# Patient Record
Sex: Female | Born: 1940
Health system: Southern US, Community
[De-identification: ages and names within clinical notes are randomized; demographics above are authoritative.]

## PROBLEM LIST (undated history)

## (undated) DIAGNOSIS — F329 Major depressive disorder, single episode, unspecified: Secondary | ICD-10-CM

## (undated) DIAGNOSIS — K219 Gastro-esophageal reflux disease without esophagitis: Secondary | ICD-10-CM

## (undated) DIAGNOSIS — K269 Duodenal ulcer, unspecified as acute or chronic, without hemorrhage or perforation: Secondary | ICD-10-CM

## (undated) DIAGNOSIS — E1140 Type 2 diabetes mellitus with diabetic neuropathy, unspecified: Secondary | ICD-10-CM

## (undated) DIAGNOSIS — D649 Anemia, unspecified: Secondary | ICD-10-CM

## (undated) DIAGNOSIS — M1990 Unspecified osteoarthritis, unspecified site: Secondary | ICD-10-CM

## (undated) DIAGNOSIS — M109 Gout, unspecified: Secondary | ICD-10-CM

## (undated) DIAGNOSIS — K2970 Gastritis, unspecified, without bleeding: Secondary | ICD-10-CM

## (undated) DIAGNOSIS — Z903 Acquired absence of stomach [part of]: Secondary | ICD-10-CM

## (undated) DIAGNOSIS — R011 Cardiac murmur, unspecified: Secondary | ICD-10-CM

## (undated) DIAGNOSIS — E876 Hypokalemia: Secondary | ICD-10-CM

## (undated) DIAGNOSIS — K3184 Gastroparesis: Secondary | ICD-10-CM

## (undated) DIAGNOSIS — N390 Urinary tract infection, site not specified: Secondary | ICD-10-CM

## (undated) DIAGNOSIS — J302 Other seasonal allergic rhinitis: Secondary | ICD-10-CM

## (undated) DIAGNOSIS — K2960 Other gastritis without bleeding: Secondary | ICD-10-CM

## (undated) DIAGNOSIS — E785 Hyperlipidemia, unspecified: Secondary | ICD-10-CM

## (undated) DIAGNOSIS — I10 Essential (primary) hypertension: Secondary | ICD-10-CM

## (undated) DIAGNOSIS — F32A Depression, unspecified: Secondary | ICD-10-CM

## (undated) DIAGNOSIS — Z5189 Encounter for other specified aftercare: Secondary | ICD-10-CM

## (undated) DIAGNOSIS — K838 Other specified diseases of biliary tract: Secondary | ICD-10-CM

## (undated) DIAGNOSIS — K648 Other hemorrhoids: Secondary | ICD-10-CM

## (undated) DIAGNOSIS — Z794 Long term (current) use of insulin: Secondary | ICD-10-CM

## (undated) DIAGNOSIS — K259 Gastric ulcer, unspecified as acute or chronic, without hemorrhage or perforation: Secondary | ICD-10-CM

## (undated) DIAGNOSIS — K449 Diaphragmatic hernia without obstruction or gangrene: Secondary | ICD-10-CM

## (undated) DIAGNOSIS — G629 Polyneuropathy, unspecified: Secondary | ICD-10-CM

## (undated) DIAGNOSIS — G4730 Sleep apnea, unspecified: Secondary | ICD-10-CM

## (undated) DIAGNOSIS — K8050 Calculus of bile duct without cholangitis or cholecystitis without obstruction: Secondary | ICD-10-CM

## (undated) DIAGNOSIS — Z950 Presence of cardiac pacemaker: Secondary | ICD-10-CM

## (undated) DIAGNOSIS — E118 Type 2 diabetes mellitus with unspecified complications: Secondary | ICD-10-CM

## (undated) DIAGNOSIS — T7840XA Allergy, unspecified, initial encounter: Secondary | ICD-10-CM

## (undated) DIAGNOSIS — M810 Age-related osteoporosis without current pathological fracture: Secondary | ICD-10-CM

## (undated) DIAGNOSIS — R7989 Other specified abnormal findings of blood chemistry: Secondary | ICD-10-CM

## (undated) HISTORY — DX: Long term (current) use of insulin: Z79.4

## (undated) HISTORY — DX: Diaphragmatic hernia without obstruction or gangrene: K44.9

## (undated) HISTORY — DX: Type 2 diabetes mellitus with diabetic neuropathy, unspecified: E11.40

## (undated) HISTORY — DX: Gastro-esophageal reflux disease without esophagitis: K21.9

## (undated) HISTORY — PX: TONSILLECTOMY: SUR1361

## (undated) HISTORY — DX: Depression, unspecified: F32.A

## (undated) HISTORY — DX: Gastritis, unspecified, without bleeding: K29.70

## (undated) HISTORY — DX: Gout, unspecified: M10.9

## (undated) HISTORY — DX: Presence of cardiac pacemaker: Z95.0

## (undated) HISTORY — DX: Sleep apnea, unspecified: G47.30

## (undated) HISTORY — PX: PARTIAL GASTRECTOMY: SHX2172

## (undated) HISTORY — DX: Calculus of bile duct without cholangitis or cholecystitis without obstruction: K80.50

## (undated) HISTORY — DX: Encounter for other specified aftercare: Z51.89

## (undated) HISTORY — DX: Other seasonal allergic rhinitis: J30.2

## (undated) HISTORY — PX: OTHER SURGICAL HISTORY: SHX169

## (undated) HISTORY — DX: Hyperlipidemia, unspecified: E78.5

## (undated) HISTORY — PX: EYE SURGERY: SHX253

## (undated) HISTORY — DX: Other hemorrhoids: K64.8

## (undated) HISTORY — DX: Cardiac murmur, unspecified: R01.1

## (undated) HISTORY — DX: Urinary tract infection, site not specified: N39.0

## (undated) HISTORY — DX: Other specified diseases of biliary tract: K83.8

## (undated) HISTORY — DX: Gastroparesis: K31.84

## (undated) HISTORY — DX: Essential (primary) hypertension: I10

## (undated) HISTORY — PX: JOINT REPLACEMENT: SHX530

## (undated) HISTORY — DX: Age-related osteoporosis without current pathological fracture: M81.0

## (undated) HISTORY — DX: Anemia, unspecified: D64.9

## (undated) HISTORY — DX: Unspecified osteoarthritis, unspecified site: M19.90

## (undated) HISTORY — DX: Acquired absence of stomach (part of): Z90.3

## (undated) HISTORY — DX: Allergy, unspecified, initial encounter: T78.40XA

## (undated) HISTORY — DX: Polyneuropathy, unspecified: G62.9

## (undated) HISTORY — DX: Other gastritis without bleeding: K29.60

## (undated) HISTORY — DX: Major depressive disorder, single episode, unspecified: F32.9

## (undated) HISTORY — DX: Duodenal ulcer, unspecified as acute or chronic, without hemorrhage or perforation: K26.9

## (undated) HISTORY — DX: Hypokalemia: E87.6

## (undated) HISTORY — DX: Other specified abnormal findings of blood chemistry: R79.89

## (undated) HISTORY — PX: GASTRECTOMY: SHX58

## (undated) HISTORY — DX: Type 2 diabetes mellitus with unspecified complications: E11.8

## (undated) HISTORY — PX: UPPER GASTROINTESTINAL ENDOSCOPY: SHX188

## (undated) HISTORY — DX: Gastric ulcer, unspecified as acute or chronic, without hemorrhage or perforation: K25.9

---

## 2015-12-20 ENCOUNTER — Ambulatory Visit (INDEPENDENT_AMBULATORY_CARE_PROVIDER_SITE_OTHER): Payer: Medicare Other | Admitting: Emergency Medicine

## 2015-12-20 VITALS — BP 120/68 | HR 84 | Temp 98.4°F | Resp 18 | Ht 64.0 in | Wt 136.0 lb

## 2015-12-20 DIAGNOSIS — N3000 Acute cystitis without hematuria: Secondary | ICD-10-CM

## 2015-12-20 LAB — POCT URINALYSIS DIP (MANUAL ENTRY)
BILIRUBIN UA: NEGATIVE
Glucose, UA: NEGATIVE
NITRITE UA: NEGATIVE
PH UA: 5.5
PROTEIN UA: NEGATIVE
RBC UA: NEGATIVE
Spec Grav, UA: 1.02
Urobilinogen, UA: 0.2

## 2015-12-20 LAB — POC MICROSCOPIC URINALYSIS (UMFC): MUCUS RE: ABSENT

## 2015-12-20 MED ORDER — CIPROFLOXACIN HCL 500 MG PO TABS: 500.0000 mg | ORAL_TABLET | Freq: Two times a day (BID) | ORAL | Status: DC

## 2015-12-20 MED ORDER — PHENAZOPYRIDINE HCL 200 MG PO TABS: 200.0000 mg | ORAL_TABLET | Freq: Three times a day (TID) | ORAL | Status: DC | PRN

## 2015-12-20 NOTE — Progress Notes (Signed)
Subjective:  Patient ID: Judith [name], female    DOB: 1941-11-30  Age: 74 y.o. MRN: [id number]  CC: Dysuria and Urinary Frequency   HPI Judith [name] presents   Patient is visiting from [place] and has over the last several days developed dysuria urgency and frequency. She has no fever or chills. No back pain. No nausea or vomiting. No abdominal pain. No stool change. No vaginal discharge or bleeding.  History Judith [name] has a past medical history of Diabetes mellitus without complication ([place]); Hypertension; Myocardial infarction [facility]); GERD (gastroesophageal reflux disease); and Allergy.   She has past surgical history that includes pace maker; Gastric bypass; Coronary artery bypass graft; and Joint replacement.   Her  family history is not on file.  She   reports that she has never smoked. She does not have any smokeless tobacco history on file. She reports that she does not drink alcohol or use illicit drugs.  No outpatient prescriptions prior to visit.   No facility-administered medications prior to visit.    Social History   Social History  . Marital Status: Widowed    Spouse Name: N/A  . Number of Children: N/A  . Years of Education: N/A   Social History Main Topics  . Smoking status: Never Smoker   . Smokeless tobacco: None  . Alcohol Use: No  . Drug Use: No  . Sexual Activity: Not Asked   Other Topics Concern  . None   Social History Narrative  . None     Review of Systems  Constitutional: Negative for fever, chills and appetite change.  HENT: Negative for congestion, ear pain, postnasal drip, sinus pressure and sore throat.   Eyes: Negative for pain and redness.  Respiratory: Negative for cough, shortness of breath and wheezing.   Cardiovascular: Negative for leg swelling.  Gastrointestinal: Negative for nausea, vomiting, abdominal pain, diarrhea, constipation and blood in stool.  Endocrine: Negative for polyuria.  Genitourinary: Positive for dysuria,  urgency and frequency. Negative for flank pain.  Musculoskeletal: Negative for gait problem.  Skin: Negative for rash.  Neurological: Negative for weakness and headaches.  Psychiatric/Behavioral: Negative for confusion and decreased concentration. The patient is not nervous/anxious.     Objective:  BP 120/68 mmHg  Pulse 84  Temp(Src) 98.4 F (36.9 C) (Oral)  Resp 18  Ht 5\' 4"  (1.626 m)  Wt 136 lb (61.689 kg)  BMI 23.33 kg/m2  SpO2 95%  Physical Exam  Constitutional: She is oriented to person, place, and time. She appears well-developed and well-nourished. No distress.  HENT:  Head: Normocephalic and atraumatic.  Right Ear: External ear normal.  Left Ear: External ear normal.  Nose: Nose normal.  Eyes: Conjunctivae and EOM are normal. Pupils are equal, round, and reactive to light. No scleral icterus.  Neck: Normal range of motion. Neck supple. No tracheal deviation present.  Cardiovascular: Normal rate, regular rhythm and normal heart sounds.   Pulmonary/Chest: Effort normal. No respiratory distress. She has no wheezes. She has no rales.  Abdominal: She exhibits no mass. There is no tenderness. There is no rebound and no guarding.  Musculoskeletal: She exhibits no edema.  Lymphadenopathy:    She has no cervical adenopathy.  Neurological: She is alert and oriented to person, place, and time. Coordination normal.  Skin: Skin is warm and dry. No rash noted.  Psychiatric: She has a normal mood and affect. Her behavior is normal.      Assessment & Plan:   Judith [name] was seen today for  dysuria and urinary frequency.  Diagnoses and all orders for this visit:  Acute cystitis without hematuria -     POCT Microscopic Urinalysis ([facility]) -     POCT urinalysis dipstick  Other orders -     phenazopyridine (PYRIDIUM) 200 MG tablet; Take 1 tablet (200 mg total) by mouth 3 (three) times daily as needed. -     ciprofloxacin (CIPRO) 500 MG tablet; Take 1 tablet (500 mg total) by mouth 2  (two) times daily.   I am having Ms. Parisien start on phenazopyridine and ciprofloxacin. I am also having her maintain her insulin glargine, carvedilol, omeprazole, lisinopril, buPROPion, lovastatin, amLODipine, PARoxetine, sucralfate, HYDROcodone-acetaminophen, insulin lispro, cyclobenzaprine, promethazine, celecoxib, Biotin, aspirin, and acetaminophen-codeine.  Meds ordered this encounter  Medications  . insulin glargine (LANTUS) 100 UNIT/ML injection    Sig: Inject 16 Units into the skin 2 (two) times daily.  . carvedilol (COREG) 25 MG tablet    Sig: Take 25 mg by mouth 2 (two) times daily with a meal.  . omeprazole (PRILOSEC) 40 MG capsule    Sig: Take 40 mg by mouth daily.  Marland Kitchen lisinopril (PRINIVIL,ZESTRIL) 10 MG tablet    Sig: Take 10 mg by mouth 2 (two) times daily.  Marland Kitchen buPROPion (ZYBAN) 150 MG 12 hr tablet    Sig: Take 150 mg by mouth 2 (two) times daily.  Marland Kitchen lovastatin (MEVACOR) 40 MG tablet    Sig: Take 40 mg by mouth at bedtime.  Marland Kitchen amLODipine (NORVASC) 10 MG tablet    Sig: Take 10 mg by mouth daily.  Marland Kitchen PARoxetine (PAXIL) 40 MG tablet    Sig: Take 40 mg by mouth every morning.  . sucralfate (CARAFATE) 1 G tablet    Sig: Take 1 g by mouth 4 (four) times daily.  Marland Kitchen HYDROcodone-acetaminophen (NORCO/VICODIN) 5-325 MG tablet    Sig: Take 1 tablet by mouth every 4 (four) hours as needed for moderate pain.  Marland Kitchen insulin lispro (HUMALOG) 100 UNIT/ML injection    Sig: Inject into the skin 3 (three) times daily before meals.  . cyclobenzaprine (FLEXERIL) 5 MG tablet    Sig: Take 5 mg by mouth 3 (three) times daily.  . promethazine (PHENERGAN) 25 MG tablet    Sig: Take 25 mg by mouth every 6 (six) hours as needed for nausea or vomiting.  . celecoxib (CELEBREX) 200 MG capsule    Sig: Take 200 mg by mouth daily.  . Biotin 10 MG CAPS    Sig: Take by mouth.  Marland Kitchen aspirin 81 MG tablet    Sig: Take 81 mg by mouth daily.  Marland Kitchen acetaminophen-codeine (TYLENOL #3) 300-30 MG tablet    Sig: Take by mouth  every 6 (six) hours as needed for moderate pain.  . phenazopyridine (PYRIDIUM) 200 MG tablet    Sig: Take 1 tablet (200 mg total) by mouth 3 (three) times daily as needed.    Dispense:  6 tablet    Refill:  0  . ciprofloxacin (CIPRO) 500 MG tablet    Sig: Take 1 tablet (500 mg total) by mouth 2 (two) times daily.    Dispense:  20 tablet    Refill:  0    Appropriate red flag conditions were discussed with the patient as well as actions that should be taken.  Patient expressed his understanding.  Follow-up: Return if symptoms worsen or fail to improve.  Roselee Culver, MD   Results for orders placed or performed in visit on 12/20/15  POCT Microscopic  Urinalysis ([facility])  Result Value Ref Range   WBC,UR,HPF,POC Many (A) None WBC/hpf   RBC,UR,HPF,POC None None RBC/hpf   Bacteria Few (A) None, Too numerous to count   Mucus Absent Absent   Epithelial Cells, UR Per Microscopy Few (A) None, Too numerous to count cells/hpf  POCT urinalysis dipstick  Result Value Ref Range   Color, UA yellow yellow   Clarity, UA clear clear   Glucose, UA negative negative   Bilirubin, UA negative negative   Ketones, POC UA trace (5) (A) negative   Spec Grav, UA 1.020    Blood, UA negative negative   pH, UA 5.5    Protein Ur, POC negative negative   Urobilinogen, UA 0.2    Nitrite, UA Negative Negative   Leukocytes, UA small (1+) (A) Negative

## 2015-12-20 NOTE — Patient Instructions (Signed)

## 2016-01-06 DIAGNOSIS — E782 Mixed hyperlipidemia: Secondary | ICD-10-CM | POA: Diagnosis not present

## 2016-01-06 DIAGNOSIS — Z950 Presence of cardiac pacemaker: Secondary | ICD-10-CM | POA: Diagnosis not present

## 2016-01-06 DIAGNOSIS — I10 Essential (primary) hypertension: Secondary | ICD-10-CM | POA: Diagnosis not present

## 2016-01-06 DIAGNOSIS — E119 Type 2 diabetes mellitus without complications: Secondary | ICD-10-CM | POA: Diagnosis not present

## 2016-03-06 DIAGNOSIS — G894 Chronic pain syndrome: Secondary | ICD-10-CM | POA: Diagnosis not present

## 2016-03-14 DIAGNOSIS — J029 Acute pharyngitis, unspecified: Secondary | ICD-10-CM | POA: Diagnosis not present

## 2016-04-05 ENCOUNTER — Ambulatory Visit (INDEPENDENT_AMBULATORY_CARE_PROVIDER_SITE_OTHER): Payer: Medicare Other | Admitting: Family Medicine

## 2016-04-05 ENCOUNTER — Encounter (HOSPITAL_COMMUNITY): Payer: Self-pay

## 2016-04-05 ENCOUNTER — Ambulatory Visit (HOSPITAL_COMMUNITY)
Admission: RE | Admit: 2016-04-05 | Discharge: 2016-04-05 | Disposition: A | Discharge status: Home / Self Care | Payer: Medicare Other | Source: Ambulatory Visit | Attending: Family Medicine | Admitting: Family Medicine

## 2016-04-05 VITALS — BP 118/70 | HR 85 | Temp 98.2°F | Resp 17 | Ht 63.5 in | Wt 137.0 lb

## 2016-04-05 DIAGNOSIS — M47812 Spondylosis without myelopathy or radiculopathy, cervical region: Secondary | ICD-10-CM

## 2016-04-05 DIAGNOSIS — Z794 Long term (current) use of insulin: Secondary | ICD-10-CM

## 2016-04-05 DIAGNOSIS — T148 Other injury of unspecified body region: Secondary | ICD-10-CM | POA: Diagnosis not present

## 2016-04-05 DIAGNOSIS — M4692 Unspecified inflammatory spondylopathy, cervical region: Secondary | ICD-10-CM

## 2016-04-05 DIAGNOSIS — R51 Headache: Secondary | ICD-10-CM | POA: Diagnosis not present

## 2016-04-05 DIAGNOSIS — IMO0001 Reserved for inherently not codable concepts without codable children: Secondary | ICD-10-CM

## 2016-04-05 DIAGNOSIS — G319 Degenerative disease of nervous system, unspecified: Secondary | ICD-10-CM | POA: Diagnosis not present

## 2016-04-05 DIAGNOSIS — S20211A Contusion of right front wall of thorax, initial encounter: Secondary | ICD-10-CM | POA: Diagnosis not present

## 2016-04-05 DIAGNOSIS — W19XXXA Unspecified fall, initial encounter: Secondary | ICD-10-CM

## 2016-04-05 DIAGNOSIS — R42 Dizziness and giddiness: Secondary | ICD-10-CM | POA: Diagnosis not present

## 2016-04-05 DIAGNOSIS — E1165 Type 2 diabetes mellitus with hyperglycemia: Secondary | ICD-10-CM | POA: Diagnosis not present

## 2016-04-05 DIAGNOSIS — E119 Type 2 diabetes mellitus without complications: Secondary | ICD-10-CM | POA: Insufficient documentation

## 2016-04-05 HISTORY — DX: Spondylosis without myelopathy or radiculopathy, cervical region: M47.812

## 2016-04-05 HISTORY — DX: Long term (current) use of insulin: Z79.4

## 2016-04-05 NOTE — Progress Notes (Addendum)
This is a 75 year old retired woman from [place] who came up for Easter to visit her daughter. She fell on Sunday night after driving up when she tripped on her luggage. She's had some headache, neck pain, right chest bruise, and left proximal thigh bruise.  She has chronic neck pain and does not think her neck is any worse. She has no localized tenderness of her neck.  Patient did not lose consciousness and has no bruising on her head. She does feel that she has some swelling and parietal areas of her scalp. She is able to walk and do all of her ADLs without problem. She's had no change in her vision or hearing.  Objective: Pleasant elderly woman in no acute distress BP 118/70 mmHg  Pulse 85  Temp(Src) 98.2 F (36.8 C) (Oral)  Resp 17  Ht 5' 3.5" (1.613 m)  Wt 137 lb (62.143 kg)  BMI 23.88 kg/m2  SpO2 94% Neurologically: Cranial nerves are intact with the exception of some slight ptosis of the right upper eyelid which patient says she had before the accident. Pupils are equal and reactive. There is no battle sign. TMs are normal. Oropharynx is clear and tongue is midline with symmetric elevation of the soft palate. Patient is moving all 4 extremities equally Patient has a large bruise below the right areola of her breast with no skin break Patient has no chest tenderness or spinal tenderness. Lungs are clear Chest shows the large ecchymotic area surrounding the right areola but is not tender with rib compression or sternal compression. Abdomen: Soft nontender without HSM or any ecchymotic areas. Extremities are moving without difficulty and patient is able to sit and lie down without assistance.  Assessment: I suspect patient just had some contusions but with the head symptoms, his subdurals possibility.  Plan: CT this morning, reassurance if this is okay.  Signed, Robyn Haber M.D.  EXAM: CT HEAD WITHOUT CONTRAST  TECHNIQUE: Contiguous axial images were obtained  from the base of the skull through the vertex without intravenous contrast.  COMPARISON: None.  FINDINGS: The bony calvarium is intact. No gross soft tissue abnormality is noted. Mild atrophic changes are noted consistent with the patient's given age. No findings to suggest acute hemorrhage, acute infarction or space-occupying mass lesion are noted.  IMPRESSION: Mild atrophic changes without acute abnormality.   Electronically Signed  By: Inez Catalina M.D.  On: 04/05/2016 13:31

## 2016-04-05 NOTE — Patient Instructions (Addendum)
Go to [facility] today at 12:45 pm for outpatient ct. You will register first floor radiology.  I suspect you just have some bruising around the scalp, chest, and left leg. We're doing a CAT scan to make sure you do not have a subdural hematoma. If the CAT scan is negative, then you may use Tylenol or other over-the-counter simple medicines for comfort until these injuries heal on their own.

## 2016-06-04 DIAGNOSIS — Z6823 Body mass index (BMI) 23.0-23.9, adult: Secondary | ICD-10-CM | POA: Diagnosis not present

## 2016-06-04 DIAGNOSIS — G894 Chronic pain syndrome: Secondary | ICD-10-CM | POA: Diagnosis not present

## 2016-08-17 DIAGNOSIS — Z6822 Body mass index (BMI) 22.0-22.9, adult: Secondary | ICD-10-CM | POA: Diagnosis not present

## 2016-08-17 DIAGNOSIS — E119 Type 2 diabetes mellitus without complications: Secondary | ICD-10-CM | POA: Diagnosis not present

## 2016-08-17 DIAGNOSIS — G894 Chronic pain syndrome: Secondary | ICD-10-CM | POA: Diagnosis not present

## 2016-08-23 DIAGNOSIS — J029 Acute pharyngitis, unspecified: Secondary | ICD-10-CM | POA: Diagnosis not present

## 2016-08-23 DIAGNOSIS — Z6822 Body mass index (BMI) 22.0-22.9, adult: Secondary | ICD-10-CM | POA: Diagnosis not present

## 2016-09-13 DIAGNOSIS — J029 Acute pharyngitis, unspecified: Secondary | ICD-10-CM | POA: Diagnosis not present

## 2016-09-13 DIAGNOSIS — G894 Chronic pain syndrome: Secondary | ICD-10-CM | POA: Diagnosis not present

## 2016-10-19 DIAGNOSIS — Z23 Encounter for immunization: Secondary | ICD-10-CM | POA: Diagnosis not present

## 2016-11-30 DIAGNOSIS — J028 Acute pharyngitis due to other specified organisms: Secondary | ICD-10-CM | POA: Diagnosis not present

## 2016-11-30 DIAGNOSIS — Z6823 Body mass index (BMI) 23.0-23.9, adult: Secondary | ICD-10-CM | POA: Diagnosis not present

## 2016-12-28 DIAGNOSIS — J988 Other specified respiratory disorders: Secondary | ICD-10-CM | POA: Diagnosis not present

## 2016-12-28 DIAGNOSIS — Z6822 Body mass index (BMI) 22.0-22.9, adult: Secondary | ICD-10-CM | POA: Diagnosis not present

## 2017-01-11 DIAGNOSIS — E7800 Pure hypercholesterolemia, unspecified: Secondary | ICD-10-CM | POA: Diagnosis not present

## 2017-01-11 DIAGNOSIS — I10 Essential (primary) hypertension: Secondary | ICD-10-CM | POA: Diagnosis not present

## 2017-01-11 DIAGNOSIS — Z950 Presence of cardiac pacemaker: Secondary | ICD-10-CM | POA: Diagnosis not present

## 2017-02-07 DIAGNOSIS — E7800 Pure hypercholesterolemia, unspecified: Secondary | ICD-10-CM | POA: Diagnosis not present

## 2017-02-07 DIAGNOSIS — Z950 Presence of cardiac pacemaker: Secondary | ICD-10-CM | POA: Diagnosis not present

## 2017-02-07 DIAGNOSIS — I10 Essential (primary) hypertension: Secondary | ICD-10-CM | POA: Diagnosis not present

## 2017-02-28 DIAGNOSIS — Z950 Presence of cardiac pacemaker: Secondary | ICD-10-CM | POA: Diagnosis not present

## 2017-03-28 DIAGNOSIS — I10 Essential (primary) hypertension: Secondary | ICD-10-CM | POA: Diagnosis not present

## 2017-03-28 DIAGNOSIS — E119 Type 2 diabetes mellitus without complications: Secondary | ICD-10-CM | POA: Diagnosis not present

## 2017-03-28 DIAGNOSIS — Z6822 Body mass index (BMI) 22.0-22.9, adult: Secondary | ICD-10-CM | POA: Diagnosis not present

## 2017-03-28 DIAGNOSIS — G894 Chronic pain syndrome: Secondary | ICD-10-CM | POA: Diagnosis not present

## 2017-06-03 DIAGNOSIS — R112 Nausea with vomiting, unspecified: Secondary | ICD-10-CM | POA: Diagnosis not present

## 2017-06-03 DIAGNOSIS — K2960 Other gastritis without bleeding: Secondary | ICD-10-CM | POA: Diagnosis not present

## 2017-06-03 DIAGNOSIS — R197 Diarrhea, unspecified: Secondary | ICD-10-CM | POA: Diagnosis not present

## 2017-06-03 DIAGNOSIS — Z903 Acquired absence of stomach [part of]: Secondary | ICD-10-CM | POA: Diagnosis not present

## 2017-06-05 DIAGNOSIS — Z903 Acquired absence of stomach [part of]: Secondary | ICD-10-CM | POA: Diagnosis not present

## 2017-06-19 DIAGNOSIS — Z9889 Other specified postprocedural states: Secondary | ICD-10-CM | POA: Diagnosis not present

## 2017-06-19 DIAGNOSIS — K2960 Other gastritis without bleeding: Secondary | ICD-10-CM | POA: Diagnosis not present

## 2017-06-19 DIAGNOSIS — K259 Gastric ulcer, unspecified as acute or chronic, without hemorrhage or perforation: Secondary | ICD-10-CM | POA: Diagnosis not present

## 2017-06-19 DIAGNOSIS — D131 Benign neoplasm of stomach: Secondary | ICD-10-CM | POA: Diagnosis not present

## 2017-06-19 DIAGNOSIS — K2970 Gastritis, unspecified, without bleeding: Secondary | ICD-10-CM | POA: Diagnosis not present

## 2017-06-19 DIAGNOSIS — K228 Other specified diseases of esophagus: Secondary | ICD-10-CM | POA: Diagnosis not present

## 2017-06-27 DIAGNOSIS — K2950 Unspecified chronic gastritis without bleeding: Secondary | ICD-10-CM | POA: Diagnosis not present

## 2017-06-27 DIAGNOSIS — K3189 Other diseases of stomach and duodenum: Secondary | ICD-10-CM | POA: Diagnosis not present

## 2017-07-03 DIAGNOSIS — K3184 Gastroparesis: Secondary | ICD-10-CM | POA: Diagnosis not present

## 2017-07-03 DIAGNOSIS — Z903 Acquired absence of stomach [part of]: Secondary | ICD-10-CM | POA: Diagnosis not present

## 2017-07-03 DIAGNOSIS — K2960 Other gastritis without bleeding: Secondary | ICD-10-CM | POA: Diagnosis not present

## 2017-07-03 DIAGNOSIS — K269 Duodenal ulcer, unspecified as acute or chronic, without hemorrhage or perforation: Secondary | ICD-10-CM | POA: Diagnosis not present

## 2017-08-01 DIAGNOSIS — Z681 Body mass index (BMI) 19 or less, adult: Secondary | ICD-10-CM | POA: Diagnosis not present

## 2017-08-01 DIAGNOSIS — G894 Chronic pain syndrome: Secondary | ICD-10-CM | POA: Diagnosis not present

## 2017-09-30 DIAGNOSIS — K2960 Other gastritis without bleeding: Secondary | ICD-10-CM | POA: Diagnosis not present

## 2017-09-30 DIAGNOSIS — K3184 Gastroparesis: Secondary | ICD-10-CM | POA: Diagnosis not present

## 2017-09-30 DIAGNOSIS — K269 Duodenal ulcer, unspecified as acute or chronic, without hemorrhage or perforation: Secondary | ICD-10-CM | POA: Diagnosis not present

## 2017-09-30 DIAGNOSIS — Z903 Acquired absence of stomach [part of]: Secondary | ICD-10-CM | POA: Diagnosis not present

## 2017-10-01 DIAGNOSIS — E785 Hyperlipidemia, unspecified: Secondary | ICD-10-CM | POA: Diagnosis not present

## 2017-10-01 DIAGNOSIS — Z794 Long term (current) use of insulin: Secondary | ICD-10-CM | POA: Diagnosis not present

## 2017-10-01 DIAGNOSIS — Z96611 Presence of right artificial shoulder joint: Secondary | ICD-10-CM | POA: Diagnosis present

## 2017-10-01 DIAGNOSIS — R531 Weakness: Secondary | ICD-10-CM | POA: Diagnosis not present

## 2017-10-01 DIAGNOSIS — D649 Anemia, unspecified: Secondary | ICD-10-CM | POA: Diagnosis not present

## 2017-10-01 DIAGNOSIS — E1140 Type 2 diabetes mellitus with diabetic neuropathy, unspecified: Secondary | ICD-10-CM | POA: Diagnosis present

## 2017-10-01 DIAGNOSIS — Z91011 Allergy to milk products: Secondary | ICD-10-CM | POA: Diagnosis not present

## 2017-10-01 DIAGNOSIS — I10 Essential (primary) hypertension: Secondary | ICD-10-CM | POA: Diagnosis not present

## 2017-10-01 DIAGNOSIS — E876 Hypokalemia: Secondary | ICD-10-CM | POA: Diagnosis not present

## 2017-10-01 DIAGNOSIS — E1165 Type 2 diabetes mellitus with hyperglycemia: Secondary | ICD-10-CM | POA: Diagnosis not present

## 2017-10-01 DIAGNOSIS — R627 Adult failure to thrive: Secondary | ICD-10-CM | POA: Diagnosis not present

## 2017-10-01 DIAGNOSIS — R112 Nausea with vomiting, unspecified: Secondary | ICD-10-CM | POA: Diagnosis not present

## 2017-10-01 DIAGNOSIS — Z96612 Presence of left artificial shoulder joint: Secondary | ICD-10-CM | POA: Diagnosis present

## 2017-10-01 DIAGNOSIS — K8020 Calculus of gallbladder without cholecystitis without obstruction: Secondary | ICD-10-CM | POA: Diagnosis not present

## 2017-10-01 DIAGNOSIS — Z743 Need for continuous supervision: Secondary | ICD-10-CM | POA: Diagnosis not present

## 2017-10-01 DIAGNOSIS — Z882 Allergy status to sulfonamides status: Secondary | ICD-10-CM | POA: Diagnosis not present

## 2017-10-01 DIAGNOSIS — E1143 Type 2 diabetes mellitus with diabetic autonomic (poly)neuropathy: Secondary | ICD-10-CM | POA: Diagnosis present

## 2017-10-01 DIAGNOSIS — K760 Fatty (change of) liver, not elsewhere classified: Secondary | ICD-10-CM | POA: Diagnosis not present

## 2017-10-01 DIAGNOSIS — G933 Postviral fatigue syndrome: Secondary | ICD-10-CM | POA: Diagnosis not present

## 2017-10-01 DIAGNOSIS — R7989 Other specified abnormal findings of blood chemistry: Secondary | ICD-10-CM | POA: Diagnosis not present

## 2017-10-01 DIAGNOSIS — Z681 Body mass index (BMI) 19 or less, adult: Secondary | ICD-10-CM | POA: Diagnosis not present

## 2017-10-01 DIAGNOSIS — E11319 Type 2 diabetes mellitus with unspecified diabetic retinopathy without macular edema: Secondary | ICD-10-CM | POA: Diagnosis present

## 2017-10-01 DIAGNOSIS — E43 Unspecified severe protein-calorie malnutrition: Secondary | ICD-10-CM | POA: Diagnosis not present

## 2017-10-01 DIAGNOSIS — R634 Abnormal weight loss: Secondary | ICD-10-CM | POA: Diagnosis not present

## 2017-10-01 DIAGNOSIS — S79812A Other specified injuries of left hip, initial encounter: Secondary | ICD-10-CM | POA: Diagnosis not present

## 2017-10-01 DIAGNOSIS — K269 Duodenal ulcer, unspecified as acute or chronic, without hemorrhage or perforation: Secondary | ICD-10-CM | POA: Diagnosis not present

## 2017-10-01 DIAGNOSIS — R2681 Unsteadiness on feet: Secondary | ICD-10-CM | POA: Diagnosis not present

## 2017-10-01 DIAGNOSIS — F329 Major depressive disorder, single episode, unspecified: Secondary | ICD-10-CM | POA: Diagnosis present

## 2017-10-01 DIAGNOSIS — Z7689 Persons encountering health services in other specified circumstances: Secondary | ICD-10-CM | POA: Diagnosis not present

## 2017-10-01 DIAGNOSIS — R918 Other nonspecific abnormal finding of lung field: Secondary | ICD-10-CM | POA: Diagnosis not present

## 2017-10-01 DIAGNOSIS — R269 Unspecified abnormalities of gait and mobility: Secondary | ICD-10-CM | POA: Diagnosis not present

## 2017-10-01 DIAGNOSIS — K219 Gastro-esophageal reflux disease without esophagitis: Secondary | ICD-10-CM | POA: Diagnosis not present

## 2017-10-01 DIAGNOSIS — R55 Syncope and collapse: Secondary | ICD-10-CM | POA: Diagnosis not present

## 2017-10-01 DIAGNOSIS — Z9884 Bariatric surgery status: Secondary | ICD-10-CM | POA: Diagnosis not present

## 2017-10-01 DIAGNOSIS — Z888 Allergy status to other drugs, medicaments and biological substances status: Secondary | ICD-10-CM | POA: Diagnosis not present

## 2017-10-01 DIAGNOSIS — E860 Dehydration: Secondary | ICD-10-CM | POA: Diagnosis not present

## 2017-10-01 DIAGNOSIS — F339 Major depressive disorder, recurrent, unspecified: Secondary | ICD-10-CM | POA: Diagnosis not present

## 2017-10-01 DIAGNOSIS — E1121 Type 2 diabetes mellitus with diabetic nephropathy: Secondary | ICD-10-CM | POA: Diagnosis present

## 2017-10-01 DIAGNOSIS — Z23 Encounter for immunization: Secondary | ICD-10-CM | POA: Diagnosis not present

## 2017-10-01 DIAGNOSIS — M6281 Muscle weakness (generalized): Secondary | ICD-10-CM | POA: Diagnosis not present

## 2017-10-01 DIAGNOSIS — E119 Type 2 diabetes mellitus without complications: Secondary | ICD-10-CM | POA: Diagnosis not present

## 2017-10-01 DIAGNOSIS — K3184 Gastroparesis: Secondary | ICD-10-CM | POA: Diagnosis present

## 2017-10-01 DIAGNOSIS — Z91012 Allergy to eggs: Secondary | ICD-10-CM | POA: Diagnosis not present

## 2017-10-01 DIAGNOSIS — R42 Dizziness and giddiness: Secondary | ICD-10-CM | POA: Diagnosis not present

## 2017-10-01 DIAGNOSIS — I959 Hypotension, unspecified: Secondary | ICD-10-CM | POA: Diagnosis not present

## 2017-10-01 DIAGNOSIS — R197 Diarrhea, unspecified: Secondary | ICD-10-CM | POA: Diagnosis not present

## 2017-10-01 DIAGNOSIS — Z9181 History of falling: Secondary | ICD-10-CM | POA: Diagnosis not present

## 2017-10-01 DIAGNOSIS — Z950 Presence of cardiac pacemaker: Secondary | ICD-10-CM | POA: Diagnosis not present

## 2017-10-04 DIAGNOSIS — E119 Type 2 diabetes mellitus without complications: Secondary | ICD-10-CM | POA: Diagnosis not present

## 2017-10-04 DIAGNOSIS — R531 Weakness: Secondary | ICD-10-CM | POA: Diagnosis not present

## 2017-10-04 DIAGNOSIS — Z23 Encounter for immunization: Secondary | ICD-10-CM | POA: Diagnosis not present

## 2017-10-04 DIAGNOSIS — R269 Unspecified abnormalities of gait and mobility: Secondary | ICD-10-CM | POA: Diagnosis not present

## 2017-10-04 DIAGNOSIS — I10 Essential (primary) hypertension: Secondary | ICD-10-CM | POA: Diagnosis not present

## 2017-10-04 DIAGNOSIS — E860 Dehydration: Secondary | ICD-10-CM | POA: Diagnosis not present

## 2017-10-04 DIAGNOSIS — Z9181 History of falling: Secondary | ICD-10-CM | POA: Diagnosis not present

## 2017-10-04 DIAGNOSIS — R42 Dizziness and giddiness: Secondary | ICD-10-CM | POA: Diagnosis not present

## 2017-10-04 DIAGNOSIS — K219 Gastro-esophageal reflux disease without esophagitis: Secondary | ICD-10-CM | POA: Diagnosis not present

## 2017-10-04 DIAGNOSIS — R2681 Unsteadiness on feet: Secondary | ICD-10-CM | POA: Diagnosis not present

## 2017-10-04 DIAGNOSIS — E1359 Other specified diabetes mellitus with other circulatory complications: Secondary | ICD-10-CM | POA: Diagnosis not present

## 2017-10-04 DIAGNOSIS — R55 Syncope and collapse: Secondary | ICD-10-CM | POA: Diagnosis not present

## 2017-10-04 DIAGNOSIS — R627 Adult failure to thrive: Secondary | ICD-10-CM | POA: Diagnosis not present

## 2017-10-04 DIAGNOSIS — K269 Duodenal ulcer, unspecified as acute or chronic, without hemorrhage or perforation: Secondary | ICD-10-CM | POA: Diagnosis not present

## 2017-10-04 DIAGNOSIS — E1165 Type 2 diabetes mellitus with hyperglycemia: Secondary | ICD-10-CM | POA: Diagnosis not present

## 2017-10-04 DIAGNOSIS — E876 Hypokalemia: Secondary | ICD-10-CM | POA: Diagnosis not present

## 2017-10-04 DIAGNOSIS — Z7689 Persons encountering health services in other specified circumstances: Secondary | ICD-10-CM | POA: Diagnosis not present

## 2017-10-04 DIAGNOSIS — F339 Major depressive disorder, recurrent, unspecified: Secondary | ICD-10-CM | POA: Diagnosis not present

## 2017-10-04 DIAGNOSIS — Z794 Long term (current) use of insulin: Secondary | ICD-10-CM | POA: Diagnosis not present

## 2017-10-04 DIAGNOSIS — R634 Abnormal weight loss: Secondary | ICD-10-CM | POA: Diagnosis not present

## 2017-10-04 DIAGNOSIS — M6281 Muscle weakness (generalized): Secondary | ICD-10-CM | POA: Diagnosis not present

## 2017-10-04 DIAGNOSIS — E785 Hyperlipidemia, unspecified: Secondary | ICD-10-CM | POA: Diagnosis not present

## 2017-10-11 DIAGNOSIS — E1359 Other specified diabetes mellitus with other circulatory complications: Secondary | ICD-10-CM | POA: Diagnosis not present

## 2017-10-29 ENCOUNTER — Ambulatory Visit (INDEPENDENT_AMBULATORY_CARE_PROVIDER_SITE_OTHER): Payer: Medicare Other | Admitting: Family Medicine

## 2017-10-29 ENCOUNTER — Encounter: Payer: Self-pay | Admitting: Family Medicine

## 2017-10-29 VITALS — BP 122/64 | HR 68 | Temp 97.6°F | Resp 16 | Ht 63.5 in | Wt 114.6 lb

## 2017-10-29 DIAGNOSIS — Z903 Acquired absence of stomach [part of]: Secondary | ICD-10-CM | POA: Diagnosis not present

## 2017-10-29 DIAGNOSIS — Z794 Long term (current) use of insulin: Secondary | ICD-10-CM

## 2017-10-29 DIAGNOSIS — E1165 Type 2 diabetes mellitus with hyperglycemia: Secondary | ICD-10-CM

## 2017-10-29 DIAGNOSIS — IMO0002 Reserved for concepts with insufficient information to code with codable children: Secondary | ICD-10-CM

## 2017-10-29 DIAGNOSIS — K3184 Gastroparesis: Secondary | ICD-10-CM | POA: Diagnosis not present

## 2017-10-29 DIAGNOSIS — E876 Hypokalemia: Secondary | ICD-10-CM

## 2017-10-29 DIAGNOSIS — Z950 Presence of cardiac pacemaker: Secondary | ICD-10-CM | POA: Diagnosis not present

## 2017-10-29 DIAGNOSIS — M1000 Idiopathic gout, unspecified site: Secondary | ICD-10-CM | POA: Diagnosis not present

## 2017-10-29 DIAGNOSIS — K2981 Duodenitis with bleeding: Secondary | ICD-10-CM

## 2017-10-29 DIAGNOSIS — R531 Weakness: Secondary | ICD-10-CM

## 2017-10-29 DIAGNOSIS — D649 Anemia, unspecified: Secondary | ICD-10-CM | POA: Diagnosis not present

## 2017-10-29 DIAGNOSIS — E118 Type 2 diabetes mellitus with unspecified complications: Secondary | ICD-10-CM

## 2017-10-29 DIAGNOSIS — Z09 Encounter for follow-up examination after completed treatment for conditions other than malignant neoplasm: Secondary | ICD-10-CM

## 2017-10-29 DIAGNOSIS — K269 Duodenal ulcer, unspecified as acute or chronic, without hemorrhage or perforation: Secondary | ICD-10-CM

## 2017-10-29 MED ORDER — BLOOD GLUCOSE MONITOR KIT: PACK | 0 refills | Status: DC

## 2017-10-29 NOTE — Patient Instructions (Signed)
     IF you received an x-ray today, you will receive an invoice from [facility]. Please contact [facility] at [phone] with questions or concerns regarding your invoice.   IF you received labwork today, you will receive an invoice from [facility]. Please contact [facility] at [phone] with questions or concerns regarding your invoice.   Our billing staff will not be able to assist you with questions regarding bills from these companies.  You will be contacted with the lab results as soon as they are available. The fastest way to get your results is to activate your My Chart account. Instructions are located on the last page of this paperwork. If you have not heard from us regarding the results in 2 weeks, please contact this office.     

## 2017-10-30 ENCOUNTER — Telehealth: Payer: Self-pay | Admitting: Family Medicine

## 2017-10-30 ENCOUNTER — Encounter: Payer: Self-pay | Admitting: Family Medicine

## 2017-10-30 DIAGNOSIS — M109 Gout, unspecified: Secondary | ICD-10-CM | POA: Insufficient documentation

## 2017-10-30 DIAGNOSIS — F329 Major depressive disorder, single episode, unspecified: Secondary | ICD-10-CM | POA: Insufficient documentation

## 2017-10-30 DIAGNOSIS — K269 Duodenal ulcer, unspecified as acute or chronic, without hemorrhage or perforation: Secondary | ICD-10-CM | POA: Insufficient documentation

## 2017-10-30 DIAGNOSIS — K2960 Other gastritis without bleeding: Secondary | ICD-10-CM | POA: Insufficient documentation

## 2017-10-30 DIAGNOSIS — Z903 Acquired absence of stomach [part of]: Secondary | ICD-10-CM | POA: Insufficient documentation

## 2017-10-30 DIAGNOSIS — E1140 Type 2 diabetes mellitus with diabetic neuropathy, unspecified: Secondary | ICD-10-CM | POA: Insufficient documentation

## 2017-10-30 DIAGNOSIS — Z794 Long term (current) use of insulin: Secondary | ICD-10-CM | POA: Insufficient documentation

## 2017-10-30 DIAGNOSIS — Z950 Presence of cardiac pacemaker: Secondary | ICD-10-CM | POA: Insufficient documentation

## 2017-10-30 DIAGNOSIS — I10 Essential (primary) hypertension: Secondary | ICD-10-CM | POA: Insufficient documentation

## 2017-10-30 DIAGNOSIS — G629 Polyneuropathy, unspecified: Secondary | ICD-10-CM | POA: Insufficient documentation

## 2017-10-30 DIAGNOSIS — J302 Other seasonal allergic rhinitis: Secondary | ICD-10-CM | POA: Insufficient documentation

## 2017-10-30 DIAGNOSIS — K3184 Gastroparesis: Secondary | ICD-10-CM | POA: Insufficient documentation

## 2017-10-30 DIAGNOSIS — F32A Depression, unspecified: Secondary | ICD-10-CM | POA: Insufficient documentation

## 2017-10-30 LAB — CBC WITH DIFFERENTIAL/PLATELET
Basophils Absolute: 0.1 10*3/uL (ref 0.0–0.2)
Basos: 1 %
EOS (ABSOLUTE): 0.3 10*3/uL (ref 0.0–0.4)
Eos: 3 %
Hematocrit: 36.2 % (ref 34.0–46.6)
Hemoglobin: 10.9 g/dL — ABNORMAL LOW (ref 11.1–15.9)
Immature Grans (Abs): 0 10*3/uL (ref 0.0–0.1)
Immature Granulocytes: 0 %
Lymphocytes Absolute: 1.9 10*3/uL (ref 0.7–3.1)
Lymphs: 18 %
MCH: 26.3 pg — ABNORMAL LOW (ref 26.6–33.0)
MCHC: 30.1 g/dL — ABNORMAL LOW (ref 31.5–35.7)
MCV: 87 fL (ref 79–97)
Monocytes Absolute: 1 10*3/uL — ABNORMAL HIGH (ref 0.1–0.9)
Monocytes: 9 %
Neutrophils Absolute: 7.2 10*3/uL — ABNORMAL HIGH (ref 1.4–7.0)
Neutrophils: 69 %
Platelets: 680 10*3/uL — ABNORMAL HIGH (ref 150–379)
RBC: 4.14 x10E6/uL (ref 3.77–5.28)
RDW: 19 % — ABNORMAL HIGH (ref 12.3–15.4)
WBC: 10.3 10*3/uL (ref 3.4–10.8)

## 2017-10-30 LAB — HEMOGLOBIN A1C
Est. average glucose Bld gHb Est-mCnc: 143 mg/dL
Hgb A1c MFr Bld: 6.6 % — ABNORMAL HIGH (ref 4.8–5.6)

## 2017-10-30 LAB — COMPREHENSIVE METABOLIC PANEL
ALT: 37 IU/L — ABNORMAL HIGH (ref 0–32)
AST: 110 IU/L — ABNORMAL HIGH (ref 0–40)
Albumin/Globulin Ratio: 1.2 (ref 1.2–2.2)
Albumin: 3.2 g/dL — ABNORMAL LOW (ref 3.5–4.8)
Alkaline Phosphatase: 337 IU/L — ABNORMAL HIGH (ref 39–117)
BUN/Creatinine Ratio: 31 — ABNORMAL HIGH (ref 12–28)
BUN: 16 mg/dL (ref 8–27)
Bilirubin Total: 0.4 mg/dL (ref 0.0–1.2)
CO2: 26 mmol/L (ref 20–29)
Calcium: 8.6 mg/dL — ABNORMAL LOW (ref 8.7–10.3)
Chloride: 94 mmol/L — ABNORMAL LOW (ref 96–106)
Creatinine, Ser: 0.52 mg/dL — ABNORMAL LOW (ref 0.57–1.00)
GFR calc Af Amer: 107 mL/min/{1.73_m2} (ref >59)
GFR calc non Af Amer: 92 mL/min/{1.73_m2} (ref >59)
Globulin, Total: 2.7 g/dL (ref 1.5–4.5)
Glucose: 180 mg/dL — ABNORMAL HIGH (ref 65–99)
Potassium: 5.7 mmol/L — ABNORMAL HIGH (ref 3.5–5.2)
Sodium: 134 mmol/L (ref 134–144)
Total Protein: 5.9 g/dL — ABNORMAL LOW (ref 6.0–8.5)

## 2017-10-30 LAB — LIPID PANEL
Chol/HDL Ratio: 5.5 ratio — ABNORMAL HIGH (ref 0.0–4.4)
Cholesterol, Total: 142 mg/dL (ref 100–199)
HDL: 26 mg/dL — ABNORMAL LOW (ref >39)
LDL Calculated: 88 mg/dL (ref 0–99)
Triglycerides: 138 mg/dL (ref 0–149)
VLDL Cholesterol Cal: 28 mg/dL (ref 5–40)

## 2017-10-30 LAB — URIC ACID: Uric Acid: 2.4 mg/dL — ABNORMAL LOW (ref 2.5–7.1)

## 2017-10-30 LAB — TSH: TSH: 1.05 u[IU]/mL (ref 0.450–4.500)

## 2017-10-30 MED ORDER — PANTOPRAZOLE SODIUM 40 MG PO TBEC: 40.0000 mg | DELAYED_RELEASE_TABLET | Freq: Every day | ORAL | 0 refills | Status: DC

## 2017-10-30 MED ORDER — INSULIN LISPRO 100 UNIT/ML ~~LOC~~ SOLN: SUBCUTANEOUS | 3 refills | Status: DC

## 2017-10-30 MED ORDER — RANITIDINE HCL 150 MG PO CAPS: 150.0000 mg | ORAL_CAPSULE | Freq: Every day | ORAL | 0 refills | Status: DC

## 2017-10-30 MED ORDER — INSULIN GLARGINE 100 UNIT/ML ~~LOC~~ SOLN: 10.0000 [IU] | Freq: Every day | SUBCUTANEOUS | 3 refills | Status: DC

## 2017-10-30 MED ORDER — SUCRALFATE 1 G PO TABS: 1.0000 g | ORAL_TABLET | Freq: Four times a day (QID) | ORAL | 3 refills | Status: DC

## 2017-10-30 MED ORDER — LOSARTAN POTASSIUM 25 MG PO TABS: 25.0000 mg | ORAL_TABLET | Freq: Every day | ORAL | 0 refills | Status: DC

## 2017-10-30 MED ORDER — CARVEDILOL 12.5 MG PO TABS: 12.5000 mg | ORAL_TABLET | Freq: Two times a day (BID) | ORAL | 0 refills | Status: DC

## 2017-10-30 MED ORDER — PREGABALIN 75 MG PO CAPS: 75.0000 mg | ORAL_CAPSULE | Freq: Two times a day (BID) | ORAL | 3 refills | Status: DC

## 2017-10-30 MED ORDER — DULOXETINE HCL 60 MG PO CPEP: 60.0000 mg | ORAL_CAPSULE | Freq: Every day | ORAL | 0 refills | Status: DC

## 2017-10-30 NOTE — Telephone Encounter (Signed)
Pt would like refills on the following:  Carvedilol Duloxetine Lantus Humalog Losartan Mag Oxide Pantoprazole Pregabalin Ranitidine Sucralfate

## 2017-10-30 NOTE — Telephone Encounter (Signed)
Copied from [place]. Topic: Quick Communication - See Telephone Encounter >> Oct 30, 2017  7:58 AM Ether Griffins B wrote: CRM for notification. See Telephone encounter for:  Pt was seen yesterday at the office and gave a list of medicine to the provider that needed to be called in. Pt's daughter went to pharmacy last night and nothing has been called in. Pt is out of all her medicines. Pt's daughter states she gave a list of the medicines to the provider.   10/30/17.

## 2017-10-30 NOTE — Progress Notes (Signed)
11/7/201811:24 AM  Judith [name] [birth year], 76 y.o. female [id number]  Chief Complaint  Patient presents with  . Follow-up    fall, September 30, 2017 and injured left knee    HPI:   Patient is a 76 y.o. female with past medical history significant for DM2 insulin dependent, peripheral neuropathy, complex GI history incl gastric and duodenal ulcers resulting in a partial gastrectomy, biliary reflux gastritis and severe gastroparesis, depression and CAD s/p CBAG and pacemaker placement who presents today for follow-up from being discharged from a SNF rehab center.  Patient until recently was living independently in [place], followed by cards, GI and her PCP. Gastroparesis symptoms significantly worsened, resulting in severe hypokalemia, generalized weakness and fall. Patient now using a 4WW with seat and needing assistance with ADLs, mostly her bathing.   She is now living with her daughter here in [place] until patient is able to get her strength and independence back.   They are here to establish care, requesting home health services to continue with physical therapy and assistance with new meds and dietary changes.  Patient also needs to establish with GI and Cards.  Patient reports overall doing better. She is able to eat without nausea or abd pain. Feels current medication regime is working for her. She is not really following gastroparesis dietary recommendations.   She does follow a low carb diet. She is doing well with her insulin and is checking her cbgs TID. Brings readings from past 2 days, fasting 160s, pre lunch and pre dinner in the 150s. Denies any lows. Unsure what her last A1c was. Requesting rx for new glucometer kit, has ran out of supplies.  She reports depression well controlled on cymbalta and has not been negatively affected by lyrica. She reports peripheral neuropathy is not well controlled on cymbalta and lyrica.   She is worried about her current uric acid level as she  recently recovered from a very painful gout attack.  Depression screen [facility] 2/9 10/29/2017 04/05/2016 12/20/2015  Decreased Interest 0 0 0  Down, Depressed, Hopeless 0 0 0  PHQ - 2 Score 0 0 0    Allergies  Allergen Reactions  . Asa [Aspirin]   . Sulfa Antibiotics     Prior to Admission medications   Medication Sig Start Date End Date Taking? Authorizing Provider  acetaminophen (TYLENOL) 500 MG tablet Take 500 mg every 8 (eight) hours as needed by mouth.    Yes [provider]  Borage, Borago officinalis, (BORAGE OIL) 500 MG CAPS Take 500 capsules daily by mouth.   Yes [provider]  carvedilol (COREG) 12.5 MG tablet Take 12.5 mg 2 (two) times daily with a meal by mouth.    Yes [provider]  DULoxetine (CYMBALTA) 60 MG capsule Take 60 mg once by mouth.   Yes [provider]  insulin glargine (LANTUS) 100 UNIT/ML injection Inject 10 Units at bedtime into the skin.    Yes [provider]  insulin lispro (HUMALOG) 100 UNIT/ML injection Inject 3 (three) times daily before meals into the skin. 3 units plus sliding scale of 2 units per each 50 above glucose reading of 200, average TDD 15 units.   Yes [provider]  losartan (COZAAR) 25 MG tablet Take 25 mg daily by mouth.   Yes [provider]  magnesium oxide (MAG-OX) 400 MG tablet Take 400 mg daily by mouth.   Yes [provider]  Multiple Vitamin (MULTIVITAMIN) LIQD Take 5 mLs daily  by mouth.   Yes [provider]  Multiple Vitamin (MULTIVITAMIN) tablet Take 1 tablet daily by mouth.   Yes [provider]  pantoprazole (PROTONIX) 40 MG tablet Take 40 mg 2 (two) times daily by mouth.   Yes [provider]  Potassium Chloride ER 20 MEQ TBCR Take 20 mEq 2 (two) times daily by mouth.   Yes [provider]  pregabalin (LYRICA) 75 MG capsule Take 75 mg 2 (two) times daily by mouth.   Yes [provider]  ranitidine (ZANTAC) 150  MG capsule Take 150 mg at bedtime by mouth.   Yes [provider]  sucralfate (CARAFATE) 1 G tablet Take 1 g by mouth 4 (four) times daily.   Yes [provider]  blood glucose meter kit and supplies KIT Dispense based on patient and insurance preference. Check capillary glucose three times a day, before meals.  Dx E11.65, Z79.4 10/29/17   Rutherford Guys, MD  cyclobenzaprine (FLEXERIL) 5 MG tablet Take 5 mg by mouth 3 (three) times daily.    [provider]  lovastatin (MEVACOR) 40 MG tablet Take 40 mg by mouth at bedtime.    [provider]  promethazine (PHENERGAN) 25 MG tablet Take 25 mg by mouth every 6 (six) hours as needed for nausea or vomiting.    [provider]    Past Medical History:  Diagnosis Date  . Biliary gastritis   . Depression   . Diabetes mellitus type 2 with complications ([place])   . Duodenal ulcer disease   . Encounter for long-term (current) use of insulin ([place])   . Gastroparesis    severe  . Gout   . Hypertension   . Hypokalemia, gastrointestinal losses    secondary to severe gastroparesis  . Myocardial infarction ([place])   . Peripheral neuropathy   . Presence of cardiac pacemaker   . S/P partial gastrectomy    due to severe gastric and gudoenal ulcers  . Seasonal allergies     Past Surgical History:  Procedure Laterality Date  . CORONARY ARTERY BYPASS GRAFT    . JOINT REPLACEMENT    . pace maker    . PARTIAL GASTRECTOMY      Social History   Tobacco Use  . Smoking status: Never Smoker  . Smokeless tobacco: Never Used  Substance Use Topics  . Alcohol use: No    Alcohol/week: 0.0 oz   Social History   Social History Narrative   Used to live independently in [place]   However had significant issues with severe gastroparesis resulting in severe hypokalemia and general weakness and fall resulting in hospitalization and SNF rehab   Now (10/2017) living with her daughter in [place] until able to stabilize and  regain independence.   No family history on file.  Review of Systems  Constitutional: Negative for chills and fever.  Respiratory: Negative for cough and shortness of breath.   Cardiovascular: Negative for chest pain, palpitations and leg swelling.  Gastrointestinal: Positive for diarrhea. Negative for abdominal pain, blood in stool, constipation, melena, nausea and vomiting.  Genitourinary: Negative for dysuria and hematuria.  Musculoskeletal: Positive for falls.  Neurological: Positive for tingling, sensory change and weakness. Negative for focal weakness.  Psychiatric/Behavioral: Positive for depression. Negative for suicidal ideas. The patient is not nervous/anxious.      OBJECTIVE:  Blood pressure 122/64, pulse 68, temperature 97.6 F (36.4 C), temperature source Axillary, resp. rate 16, height 5' 3.5" (1.613 m), weight 114 lb 9.6 oz (  52 kg), SpO2 97 %.  Physical Exam  Constitutional: She is oriented to person, place, and time and well-developed, well-nourished, and in no distress.  HENT:  Head: Normocephalic and atraumatic.  Mouth/Throat: Oropharynx is clear and moist. No oropharyngeal exudate.  Eyes: EOM are normal. Pupils are equal, round, and reactive to light. No scleral icterus.  Neck: Neck supple.  Cardiovascular: Normal rate, regular rhythm and normal heart sounds. Exam reveals no gallop and no friction rub.  No murmur heard. Pulmonary/Chest: Effort normal and breath sounds normal. She has no wheezes. She has no rales.  Abdominal: Soft. Bowel sounds are normal. She exhibits no distension. There is no tenderness.  Musculoskeletal: She exhibits no edema.  Neurological: She is alert and oriented to person, place, and time. She displays weakness. Gait abnormal.  Using a 4 wheel walker with seat  Skin: Skin is warm and dry.    ASSESSMENT and PLAN  1. Hospital discharge follow-up Patient here to fu from SNF and establish care given she has recently moved. Limited  records available were reviewed, medical, surgical and social histories, medications and allergies reviewed. Will discuss fhx at next visit given not to relevant in a 76yo. Referrals made as requested. Routine labs ordered. Medical records from physician care team in [place] requested.   1. Type II diabetes mellitus with complication, uncontrolled ([place]) - CBC with Differential - Comprehensive metabolic panel - Lipid panel - TSH - Hemoglobin A1c - Ambulatory referral to Home Health  2. Encounter for long-term (current) use of insulin ([place])  3. Gastroparesis - Ambulatory referral to Gastroenterology - Ambulatory referral to Home Health  4. General weakness - Ambulatory referral to [facility]  5. Hypokalemia - Ambulatory referral to [facility]  6. Idiopathic gout, unspecified chronicity, unspecified site - Uric Acid  7. Anemia, unspecified type - CBC with Differential  8. Cardiac pacemaker in situ - Ambulatory referral to Cardiology  9. Multiple duodenal ulcers - Ambulatory referral to Gastroenterology  10. S/P partial gastrectomy - Ambulatory referral to Gastroenterology  - blood glucose meter kit and supplies KIT; Dispense based on patient and insurance preference. Check capillary glucose three times a day, before meals.  Dx E11.65, Z79.4  Return in about 4 weeks (around 11/26/2017).    Rutherford Guys, MD Primary [facility] at [facility],  [id number] Ph.  [phone] Fax [phone]

## 2017-10-30 NOTE — Telephone Encounter (Signed)
Copied from [place] #[id number]. Topic: Quick Communication - See Telephone Encounter >> Oct 30, 2017  4:27 PM Boyd Kerbs wrote: CRM for notification. See Telephone encounter for:  Daughter Johnnette Litter called said medication was left off, she is needing Potassium Chloride ER 20  1x2.  She is also wanting information for Home Health referral. Please call  10/30/17.

## 2017-10-30 NOTE — Telephone Encounter (Signed)
Copied from [place] [id number]. Topic: Quick Communication - See Telephone Encounter >> Oct 30, 2017  6:26 PM Ivar Drape wrote: CRM for notification. See Telephone encounter for:  10/30/17.  Patient's daughter said she is at the pharmacy and the wrong medication was sent in.  They sent in Lantis in a vile, but she needs the Lantis pen. Also the Humalog vile was sent in, but she needs the Humalog pen. They also need a prescription for the Pen needle.

## 2017-10-30 NOTE — Telephone Encounter (Signed)
Called in lyrica. Left detailed VM.

## 2017-10-30 NOTE — Telephone Encounter (Signed)
Please let patient's daughter know that I have refilled all meds as requested except her mag oxide as patient is having diarrhea and it might be because of this. I am not worried about her needing to replace magnesium anymore given her lab results.   Also if you can please call in her lyrica that will be appreciated it as I am not in the office today.   Thanks

## 2017-10-31 ENCOUNTER — Other Ambulatory Visit: Payer: Self-pay

## 2017-10-31 ENCOUNTER — Telehealth: Payer: Self-pay | Admitting: Family Medicine

## 2017-10-31 MED ORDER — INSULIN GLARGINE 100 UNIT/ML SOLOSTAR PEN: 10.0000 [IU] | PEN_INJECTOR | Freq: Every day | SUBCUTANEOUS | 2 refills | Status: DC

## 2017-10-31 MED ORDER — INSULIN LISPRO 100 UNIT/ML (KWIKPEN): PEN_INJECTOR | SUBCUTANEOUS | 3 refills | Status: DC

## 2017-10-31 NOTE — Telephone Encounter (Signed)
Needs rx for lancets 

## 2017-10-31 NOTE — Telephone Encounter (Signed)
Patient is back to eating ok and her potassium level is back to normal. I think we should hold off on her KCL and recheck at next visit. I placed referral to [facility] at time of visit. Please have referral clerk investigate. Thanks

## 2017-10-31 NOTE — Telephone Encounter (Signed)
Copied from [place] #[id number]. Topic: Quick Communication - See Telephone Encounter >> Oct 31, 2017  4:32 PM Corie Chiquito, [place] wrote: CRM for notification. See Telephone encounter for:  Patient daughter calling because her mother needs her Potassium Chloride and a prescription for needles for sugar checks 10/31/17.

## 2017-10-31 NOTE — Telephone Encounter (Signed)
Message sent to Dr. Lauralyn Primes and [facility] referral

## 2017-11-01 ENCOUNTER — Telehealth: Payer: Self-pay | Admitting: Family Medicine

## 2017-11-01 NOTE — Telephone Encounter (Signed)
Copied from [place]. Topic: Quick Communication - See Telephone Encounter >> Nov 01, 2017  1:16 PM Boyd Kerbs wrote: CRM for notification. See Telephone encounter for:  Daughter - Johnnette Litter [phone] has called several times. Needing potassium prescription sent in. And also prescription pen needles.  She also has questions regarding home health and ambulatory. Please have someone call her today 11/01/17.

## 2017-11-01 NOTE — Telephone Encounter (Signed)
Pt's daughter would like a form filled out for her mom. I have placed it at 104 in Dr. Ardyth Gal box. Please advise [name] at [phone] when ready. It is her mom's Disability Parking Placard.

## 2017-11-04 NOTE — Telephone Encounter (Deleted)
Copied from [place] [id number]. Topic: Complaint - Care >> Nov 04, 2017 11:55 AM Patrice Paradise wrote: Date of Incident: November 7,8 & 9 Details of complaint: Daughter - Johnnette Litter [phone] has called several times. Prescription for pen needles for her insulin. She also has questions regarding home health been set up for mother. [name] would like a phone call today. How would the patient like to see it resolved? *** On a scale of 1-10, how was your experience? *** What would it take to bring it to a 10? ***  Route to Engineer, building services.

## 2017-11-04 NOTE — Telephone Encounter (Signed)
In previous message it was declined. I denied potassium, last level was high. I will monitor, but for now she does not need to be supplemented. thanks

## 2017-11-04 NOTE — Telephone Encounter (Signed)
Copied from [place] [id number]. Topic: Complaint - Care >> Nov 04, 2017 11:55 AM Patrice Paradise wrote: Date of Incident: November 7,8 & 9 Details of complaint: Daughter - Johnnette Litter [phone] has called several times. Prescription for pen needles for her insulin. She also has questions regarding home health been set up for mother. [name] would like a phone call today. How would the patient like to see it resolved? Patient daughter would like a call back asap. On a scale of 1-10, how was your experience?1 What would it take to bring it to a 10? Having someone call her back today asap.  Route to Engineer, building services.

## 2017-11-04 NOTE — Telephone Encounter (Signed)
Called in lancets.  I do not see that we prescribed the Potassium can we fill. Please Advise

## 2017-11-04 NOTE — Telephone Encounter (Signed)
Copied from [place] [phone]. Topic: Complaint - Care >> Nov 04, 2017 11:55 AM Patrice Paradise wrote: Date of Incident: November 7,8 & 9 Details of complaint: Daughter - Johnnette Litter [phone] has called several times. Prescription for pen needles for her insulin. She also has questions regarding home health been set up for mother. [name] would like a phone call today. How would the patient like to see it resolved? Patient daughter would like a call back asap. On a scale of 1-10, how was your experience? 1 What would it take to bring it to a 10? Have someone calling me back in a timely mannae=r  Route to Engineer, building services.

## 2017-11-05 ENCOUNTER — Telehealth: Payer: Self-pay | Admitting: Family Medicine

## 2017-11-05 DIAGNOSIS — E1143 Type 2 diabetes mellitus with diabetic autonomic (poly)neuropathy: Secondary | ICD-10-CM | POA: Diagnosis not present

## 2017-11-05 DIAGNOSIS — E1165 Type 2 diabetes mellitus with hyperglycemia: Secondary | ICD-10-CM | POA: Diagnosis not present

## 2017-11-05 NOTE — Telephone Encounter (Signed)
Copied from [place] [phone]. Topic: Quick Communication - See Telephone Encounter >> Nov 05, 2017  3:45 PM Boyd Kerbs wrote: CRM for notification. See Telephone encounter for: Nira Conn from [facility] called letting you know she has went to see patient today, and will see her weekly for the next 4 weeks.  Therapist will call on their visit.  11/05/17.

## 2017-11-05 NOTE — Telephone Encounter (Signed)
Please advise 

## 2017-11-05 NOTE — Telephone Encounter (Signed)
Forms are completed and ready to pick up, please notify daughter. thanks

## 2017-11-05 NOTE — Telephone Encounter (Signed)
Lm to call back for prescription .  See previous note.

## 2017-11-06 ENCOUNTER — Encounter: Payer: Self-pay | Admitting: *Deleted

## 2017-11-06 DIAGNOSIS — E1165 Type 2 diabetes mellitus with hyperglycemia: Secondary | ICD-10-CM | POA: Diagnosis not present

## 2017-11-06 DIAGNOSIS — E1143 Type 2 diabetes mellitus with diabetic autonomic (poly)neuropathy: Secondary | ICD-10-CM | POA: Diagnosis not present

## 2017-11-07 ENCOUNTER — Ambulatory Visit (INDEPENDENT_AMBULATORY_CARE_PROVIDER_SITE_OTHER): Payer: Medicare Other | Admitting: Cardiovascular Disease

## 2017-11-07 ENCOUNTER — Encounter: Payer: Self-pay | Admitting: Cardiovascular Disease

## 2017-11-07 VITALS — BP 142/58 | HR 76 | Ht 63.5 in | Wt 115.6 lb

## 2017-11-07 DIAGNOSIS — E0843 Diabetes mellitus due to underlying condition with diabetic autonomic (poly)neuropathy: Secondary | ICD-10-CM

## 2017-11-07 DIAGNOSIS — Z950 Presence of cardiac pacemaker: Secondary | ICD-10-CM | POA: Diagnosis not present

## 2017-11-07 DIAGNOSIS — I442 Atrioventricular block, complete: Secondary | ICD-10-CM | POA: Diagnosis not present

## 2017-11-07 DIAGNOSIS — E1143 Type 2 diabetes mellitus with diabetic autonomic (poly)neuropathy: Secondary | ICD-10-CM | POA: Diagnosis not present

## 2017-11-07 DIAGNOSIS — Z794 Long term (current) use of insulin: Secondary | ICD-10-CM

## 2017-11-07 DIAGNOSIS — E1165 Type 2 diabetes mellitus with hyperglycemia: Secondary | ICD-10-CM | POA: Diagnosis not present

## 2017-11-07 NOTE — Patient Instructions (Signed)

## 2017-11-07 NOTE — Progress Notes (Signed)
Cardiology Consultation Note:    Date:  11/09/2017   ID:  Judith [name], DOB [birth year], MRN [id number]  PCP:  Rutherford Guys, MD  Cardiologist:  Sanda Klein, MD    Referring MD: Rutherford Guys, MD   Chief complaint: pacemaker check Judith [name] is a 76 y.o. female who is being seen today for the evaluation of pacemaker at the request of Rutherford Guys, MD.   History of Present Illness:    Judith [name] is a 76 y.o. female with a hx of pacemaker implantation in [place].  She is temporarily [place] living with her daughter, while she recovers from a fall.  Her initial pacemaker was implanted in 2007.  It appears that her ventricular lead is still the original one (Guidant [id number] implanted July 05, 2006).  June 09, 2010 she underwent a pacemaker generator change out and placement of a new atrial lead (Guidant [id number]).  Her current generator is a Control and instrumentation engineer [id number].  Indication for pacemaker implantation seems to be complete heart block.  Current device check shows that she has no intrinsic AV conduction but there is an underlying ventricular escape rhythm at about 33 bpm.  She has 99% ventricular pacing and only 3% atrial pacing with a good heart rate histogram.  Brief episode of atrial fibrillation is detected.  It was measured by the device as lasting 6 seconds, but there is some evidence of undersensing on the atrial electrogram.  It may have lasted longer.  She is not on anticoagulation.  This is the only episode of atrial rapid rates in a very long time.  All lead parameters are excellent.  Atrial threshold is 0.5 V at 0.4 ms, measured P waves greater than 3.0 mV (bipolar), impedance ohms.  The right ventricular pacing threshold is 0.7 V at 0.4 ms, the ventricular escape beats measure greater than 12 mV in amplitude.  Atrial lead impedance is 540 ohms, ventricular lead impedance is 560 ohms.  Current battery voltage is good suggesting longevity estimated at approximately 4  years.  Most of her complaints or GI etiology.  She has severe gastroparesis and developed severe hypokalemia with weakness that was felt to be the cause of her fall.  She is using a walker and needs assistance with most activities of daily living.  Her past medical history also includes gastric and duodenal ulcers with a previous partial gastrectomy as well as biliary reflux gastritis.  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  She does not have a history of coronary disease.  She reports undergoing stress test in the past, last one as recently as 3 years ago.  Normal results.  She had an echocardiogram just a few months before coming to [place] and was told that her heart pumping strength is normal.  She was told that she has a murmur which is due to a mildly leaky valve.  There is no reported history of heart failure that I am aware of.  Her presenting rhythm today was atrial sensed, ventricular paced.  She does not have intermittent claudication and reports that lower extremity ABIs have been performed in the past with normal results.  She has never had a stroke or TIA.  She is to take lovastatin for lipid lowering but has been off this medication for a while.  She has lost about 30 pounds of weight.  Her lipid profile performed on November 6 shows an LDL cholesterol of 88, HDL 26, triglycerides  138, total cholesterol 142  She has chronic mild swelling in the lower extremities, worse in the left leg since her fall.  She has never been told that she has nephropathy.  She has normal creatinine levels but I am not sure whether she has proteinuria.  Glycemic control is excellent with a hemoglobin A1c at 6.6% earlier this month.  Her TSH is normal.  She is mildly anemic with a hemoglobin of 10.9 and hypochromic indices.  She has moderate abnormalities in her alkaline phosphatase and transaminases but normal bilirubin and minimally decreased albumin at 3.2.  Past  Medical History:  Diagnosis Date  . Biliary gastritis   . Depression   . Diabetes mellitus type 2 with complications ([place])   . Duodenal ulcer disease   . Encounter for long-term (current) use of insulin ([place])   . Gastric ulcer   . Gastroparesis    severe  . Gout   . Hypertension   . Hypokalemia, gastrointestinal losses    secondary to severe gastroparesis  . Myocardial infarction ([place])   . Peripheral neuropathy   . Presence of cardiac pacemaker   . S/P partial gastrectomy    due to severe gastric and gudoenal ulcers  . Seasonal allergies     Past Surgical History:  Procedure Laterality Date  . CORONARY ARTERY BYPASS GRAFT    . JOINT REPLACEMENT    . pace maker    . PARTIAL GASTRECTOMY      Current Medications: Current Meds  Medication Sig  . acetaminophen (TYLENOL) 500 MG tablet Take 500 mg every 8 (eight) hours as needed by mouth.   . blood glucose meter kit and supplies KIT Dispense based on patient and insurance preference. Check capillary glucose three times a day, before meals.  Dx E11.65, Z79.4  . carvedilol (COREG) 12.5 MG tablet Take 1 tablet (12.5 mg total) 2 (two) times daily with a meal by mouth.  . cyclobenzaprine (FLEXERIL) 5 MG tablet Take 5 mg 3 (three) times daily as needed by mouth.   . DULoxetine (CYMBALTA) 60 MG capsule Take 1 capsule (60 mg total) daily by mouth.  . Insulin Glargine (LANTUS SOLOSTAR) 100 UNIT/ML Solostar Pen Inject 10 Units daily at 10 pm into the skin.  Marland Kitchen insulin lispro (HUMALOG KWIKPEN) 100 UNIT/ML KiwkPen 3 units plus sliding scale of 2 units per each 50 above glucose reading of 200, average TDD 15 units.,  . magnesium oxide (MAG-OX) 400 MG tablet Take 400 mg daily by mouth.  . Multiple Vitamin (MULTIVITAMIN) tablet Take 1 tablet daily by mouth.  . pantoprazole (PROTONIX) 40 MG tablet Take 1 tablet (40 mg total) daily by mouth.  . pregabalin (LYRICA) 75 MG capsule Take 1 capsule (75 mg total) 2 (two) times daily by mouth.  .  promethazine (PHENERGAN) 25 MG tablet Take 25 mg by mouth every 6 (six) hours as needed for nausea or vomiting.  . ranitidine (ZANTAC) 150 MG capsule Take 1 capsule (150 mg total) at bedtime by mouth.  . sucralfate (CARAFATE) 1 g tablet Take 1 tablet (1 g total) 4 (four) times daily by mouth.     Allergies:   Asa [aspirin] and Sulfa antibiotics   Social History   Socioeconomic History  . Marital status: Widowed    Spouse name: None  . Number of children: None  . Years of education: None  . Highest education level: None  Social Needs  . Financial resource strain: None  . Food insecurity - worry: None  .  Food insecurity - inability: None  . Transportation needs - medical: None  . Transportation needs - non-medical: None  Occupational History  . None  Tobacco Use  . Smoking status: Never Smoker  . Smokeless tobacco: Never Used  Substance and Sexual Activity  . Alcohol use: No    Alcohol/week: 0.0 oz  . Drug use: No  . Sexual activity: No  Other Topics Concern  . None  Social History Narrative   Used to live independently in [place]   However had significant issues with severe gastroparesis resulting in severe hypokalemia and general weakness and fall resulting in hospitalization and SNF rehab   Now (10/2017) living with her daughter in [place] until able to stabilize and regain independence.     Family History: The patient's family history significant for the absence of premature cardiac illness  ROS:   Please see the history of present illness.     All other systems reviewed and are negative.  EKGs/Labs/Other Studies Reviewed:    The following studies were reviewed today: Notes from visit with Dr. Pamella Pert Full pacemaker interrogation  EKG:  EKG is ordered today.  The ekg ordered today demonstrates atrial sensed (sinus) ventricular paced rhythm with a long AV delay.  Recent Labs: 10/29/2017: ALT 37; BUN 16; Creatinine, Ser 0.52; Hemoglobin 10.9; Platelets 680; Potassium  5.7; Sodium 134; TSH 1.050  Recent Lipid Panel    Component Value Date/Time   CHOL 142 10/29/2017 1515   TRIG 138 10/29/2017 1515   HDL 26 (L) 10/29/2017 1515   CHOLHDL 5.5 (H) 10/29/2017 1515   LDLCALC 88 10/29/2017 1515    Physical Exam:    VS:  BP (!) 142/58   Pulse 76   Ht 5' 3.5" (1.613 m)   Wt 115 lb 9.6 oz (52.4 kg)   BMI 20.16 kg/m     Wt Readings from Last 3 Encounters:  11/07/17 115 lb 9.6 oz (52.4 kg)  10/29/17 114 lb 9.6 oz (52 kg)  04/05/16 137 lb (62.1 kg)     GEN: Very thin, frail-appearing, well developed in no acute distress HEENT: Normal NECK: No JVD; No carotid bruits LYMPHATICS: No lymphadenopathy CARDIAC: RRR, paradoxically split S2, no murmurs, rubs, gallops; healthy left subclavian pacemaker site RESPIRATORY:  Clear to auscultation without rales, wheezing or rhonchi  ABDOMEN: Soft, non-tender, non-distended MUSCULOSKELETAL:  No edema; No deformity  SKIN: Warm and dry NEUROLOGIC:  Alert and oriented x 3 PSYCHIATRIC:  Normal affect   ASSESSMENT:    1. Complete heart block ([place])   2. Presence of cardiac pacemaker   3. Diabetes mellitus due to underlying condition with diabetic autonomic neuropathy, with long-term current use of insulin ([facility])    PLAN:    In order of problems listed above:  1. CHB: underlying ventricular escape at 33 bpm, but should be considered device dependent 2. PPM: normal device function. 3. DM: With multiple complications including gastroparesis and neuropathy.  Good glycemic control with hemoglobin A1c 6.6% earlier this month.  We will try to get all her additional records from her cardiologist in [facility], Dr. Megan Salon at [facility].  We will enroll her in the remote follow-up pacemaker clinic with downloads every 3 months and office visits at least yearly.   Medication Adjustments/Labs and Tests Ordered: Current medicines are reviewed at length with the patient today.  Concerns regarding medicines are  outlined above.  No orders of the defined types were placed in this encounter.  No orders of the defined types  were placed in this encounter.   Signed, Sanda Klein, MD  11/09/2017 4:00 PM    [place]

## 2017-11-08 ENCOUNTER — Telehealth: Payer: Self-pay | Admitting: Family Medicine

## 2017-11-08 DIAGNOSIS — E1165 Type 2 diabetes mellitus with hyperglycemia: Secondary | ICD-10-CM | POA: Diagnosis not present

## 2017-11-08 DIAGNOSIS — E1143 Type 2 diabetes mellitus with diabetic autonomic (poly)neuropathy: Secondary | ICD-10-CM | POA: Diagnosis not present

## 2017-11-08 MED ORDER — INSULIN PEN NEEDLE 31G X 8 MM MISC: 5 refills | Status: DC

## 2017-11-08 NOTE — Telephone Encounter (Signed)
Copied from [place] [id number]. Topic: Quick Communication - See Telephone Encounter >> Nov 08, 2017 10:37 AM Cleaster Corin, NT wrote: CRM for notification. See Telephone encounter for:   11/08/17. Timmothy Sours from [facility] called to get verbal order from Dr. Pamella Pert for pt. Judith [name] for in home thearpy as follows  1x for 1 week 0x for 1 week 1x for 1 week IADL transfer exercise and ok to DC when goals are met or at max.potential

## 2017-11-08 NOTE — Telephone Encounter (Deleted)
Copied from [place] [id number]. Topic: Quick Communication - See Telephone Encounter >> Nov 08, 2017 10:37 AM Cleaster Corin, NT wrote: CRM for notification. See Telephone encounter for:   11/08/17. Timmothy Sours from [facility] called to get verbal order from Dr. Pamella Pert for pt. Judith [name] for in home thearpy as follows  1x for 1 week 0x for 1 week 1x for 1 week IADL transfer exercise and ok to DC when goals are met or at max.potential

## 2017-11-09 DIAGNOSIS — I442 Atrioventricular block, complete: Secondary | ICD-10-CM | POA: Insufficient documentation

## 2017-11-11 ENCOUNTER — Encounter: Payer: Self-pay | Admitting: Internal Medicine

## 2017-11-11 ENCOUNTER — Ambulatory Visit (INDEPENDENT_AMBULATORY_CARE_PROVIDER_SITE_OTHER): Payer: Medicare Other | Admitting: Internal Medicine

## 2017-11-11 ENCOUNTER — Other Ambulatory Visit (INDEPENDENT_AMBULATORY_CARE_PROVIDER_SITE_OTHER): Payer: Medicare Other

## 2017-11-11 VITALS — BP 108/70 | HR 78 | Ht 63.5 in | Wt 118.4 lb

## 2017-11-11 DIAGNOSIS — R7989 Other specified abnormal findings of blood chemistry: Secondary | ICD-10-CM

## 2017-11-11 DIAGNOSIS — R945 Abnormal results of liver function studies: Secondary | ICD-10-CM | POA: Diagnosis not present

## 2017-11-11 DIAGNOSIS — K3184 Gastroparesis: Secondary | ICD-10-CM

## 2017-11-11 DIAGNOSIS — E1143 Type 2 diabetes mellitus with diabetic autonomic (poly)neuropathy: Secondary | ICD-10-CM | POA: Diagnosis not present

## 2017-11-11 DIAGNOSIS — Z8719 Personal history of other diseases of the digestive system: Secondary | ICD-10-CM

## 2017-11-11 DIAGNOSIS — Z8711 Personal history of peptic ulcer disease: Secondary | ICD-10-CM

## 2017-11-11 DIAGNOSIS — E1165 Type 2 diabetes mellitus with hyperglycemia: Secondary | ICD-10-CM | POA: Diagnosis not present

## 2017-11-11 DIAGNOSIS — K219 Gastro-esophageal reflux disease without esophagitis: Secondary | ICD-10-CM | POA: Diagnosis not present

## 2017-11-11 LAB — CBC WITH DIFFERENTIAL/PLATELET
BASOS ABS: 0 10*3/uL (ref 0.0–0.1)
BASOS PCT: 0.4 % (ref 0.0–3.0)
Eosinophils Absolute: 0.3 10*3/uL (ref 0.0–0.7)
Eosinophils Relative: 4.5 % (ref 0.0–5.0)
HEMATOCRIT: 35.3 % — AB (ref 36.0–46.0)
HEMOGLOBIN: 11.1 g/dL — AB (ref 12.0–15.0)
Lymphocytes Relative: 20 % (ref 12.0–46.0)
Lymphs Abs: 1.4 10*3/uL (ref 0.7–4.0)
MCHC: 31.4 g/dL (ref 30.0–36.0)
MCV: 86.1 fl (ref 78.0–100.0)
MONOS PCT: 8.2 % (ref 3.0–12.0)
Monocytes Absolute: 0.6 10*3/uL (ref 0.1–1.0)
NEUTROS ABS: 4.5 10*3/uL (ref 1.4–7.7)
Neutrophils Relative %: 66.9 % (ref 43.0–77.0)
Platelets: 537 10*3/uL — ABNORMAL HIGH (ref 150.0–400.0)
RBC: 4.1 Mil/uL (ref 3.87–5.11)
RDW: 19 % — ABNORMAL HIGH (ref 11.5–15.5)
WBC: 6.8 10*3/uL (ref 4.0–10.5)

## 2017-11-11 LAB — IBC PANEL
Iron: 34 ug/dL — ABNORMAL LOW (ref 42–145)
SATURATION RATIOS: 10.3 % — AB (ref 20.0–50.0)
TRANSFERRIN: 236 mg/dL (ref 212.0–360.0)

## 2017-11-11 LAB — PROTIME-INR
INR: 1.4 ratio — ABNORMAL HIGH (ref 0.8–1.0)
Prothrombin Time: 15.4 s — ABNORMAL HIGH (ref 9.6–13.1)

## 2017-11-11 LAB — FERRITIN: FERRITIN: 83.3 ng/mL (ref 10.0–291.0)

## 2017-11-11 NOTE — Progress Notes (Signed)
Patient ID: Judith [name], female   DOB: [birth year], 76 y.o.   MRN: [id number] HPI: Judith [name] is a 76 year old female with a history of GERD, peptic ulcer disease status post partial gastrectomy for ulcer disease, relatively recent diagnosis of gastroparesis, insulin-dependent type 2 diabetes, hypertension, peripheral neuropathy, anemia who seen in consultation at the request of Dr. Pamella Pert to establish care regarding her gastroparesis and prior significant GI symptoms.  She is here today with her daughter.  The patient was hospitalized in October 2018 in [place], where she had been living for over 30 years after having a fall at home.  This led to a 5-day hospitalization and a 19-day stay at a rehabilitation facility being discharged from rehab on 10/24/2017.  3 days later she moved to [place] to be with her daughter.  She reports earlier this year having developed severe issues with nausea, vomiting and diarrhea.  This led to gastroenterology evaluation.  She had lost 35 pounds over this time period.  It sounds as if in July 2018 she had an EGD where there was significant food in her stomach.  She had what sounds like a near aspiration event with the upper endoscopy and so planned colonoscopy on that day was not performed.  She was then diagnosed with diabetic gastroparesis.  She has a history of severe peptic ulcer disease and had what sounds like antrectomy or a Billroth type procedure.  She has been maintained on pantoprazole once daily, ranitidine in the evening and Carafate 4 times daily.  Since her hospitalization her GI symptoms have been completely absent.  She is eating 6 small meals per day and trying to follow a low residue, gastroparesis type diet.  She has had no recent nausea, vomiting or abdominal pain.  She has a very good appetite and has started to gain weight.  Her daughter states that it has been hard to limit the amount of food that she is eating since her appetite has  returned.  She is having 1-3 bowel movements per day without blood or melena.  She reports her A1c and sugars have been better with recent A1c of 6.5%.  She was found to have elevated liver enzymes.  She does not have a history or recent alcohol use.  Her daughter stated that prior to her fall she was using Tylenol very heavily as many as 16 tablets/day and she wonders if this contributed to her elevated liver enzymes.  AST, ALT and alk phos were found to be elevated when checked by primary care earlier this month.  She denies a history of jaundice, viral hepatitis.  No family history of liver disease.  Diabetes does run strongly in her family.  She reports having a colonoscopy maybe 10 or so years ago which was reportedly normal.  She was found to have anemia when checked by primary care.  She states her anemia has been somewhat chronic.  At the time of this visit I have none of her [place] health records, specifically none of her GI records  Past Medical History:  Diagnosis Date  . Anemia   . Arthritis   . Biliary gastritis   . Depression   . Diabetes mellitus type 2 with complications ([place])   . Duodenal ulcer disease   . Encounter for long-term (current) use of insulin ([place])   . Gastric ulcer   . Gastroparesis    severe  . GERD (gastroesophageal reflux disease)   . Gout   . Hypertension   .  Hypokalemia, gastrointestinal losses    secondary to severe gastroparesis  . Myocardial infarction ([place])   . Peripheral neuropathy   . Presence of cardiac pacemaker   . S/P partial gastrectomy    due to severe gastric and gudoenal ulcers  . Seasonal allergies   . UTI (urinary tract infection)     Past Surgical History:  Procedure Laterality Date  . CORONARY ARTERY BYPASS GRAFT    . JOINT REPLACEMENT    . pace maker    . PARTIAL GASTRECTOMY      Outpatient Medications Prior to Visit  Medication Sig Dispense Refill  . acetaminophen (TYLENOL) 500 MG tablet Take 500 mg every 8 (eight)  hours as needed by mouth.     . blood glucose meter kit and supplies KIT Dispense based on patient and insurance preference. Check capillary glucose three times a day, before meals.  Dx E11.65, Z79.4 1 each 0  . carvedilol (COREG) 12.5 MG tablet Take 1 tablet (12.5 mg total) 2 (two) times daily with a meal by mouth. 180 tablet 0  . DULoxetine (CYMBALTA) 60 MG capsule Take 1 capsule (60 mg total) daily by mouth. 90 capsule 0  . Insulin Glargine (LANTUS SOLOSTAR) 100 UNIT/ML Solostar Pen Inject 10 Units daily at 10 pm into the skin. 5 pen 2  . insulin lispro (HUMALOG KWIKPEN) 100 UNIT/ML KiwkPen 3 units plus sliding scale of 2 units per each 50 above glucose reading of 200, average TDD 15 units., 5 pen 3  . Insulin Pen Needle (B-D ULTRAFINE III SHORT PEN) 31G X 8 MM MISC To use as directed with insulin, four times a day. Dx E11.9, Z79.4 200 each 5  . losartan (COZAAR) 25 MG tablet Take 1 tablet (25 mg total) daily by mouth. 90 tablet 0  . lovastatin (MEVACOR) 40 MG tablet Take 40 mg by mouth at bedtime.    . magnesium oxide (MAG-OX) 400 MG tablet Take 400 mg daily by mouth.    . Multiple Vitamin (MULTIVITAMIN) tablet Take 1 tablet daily by mouth.    . pantoprazole (PROTONIX) 40 MG tablet Take 1 tablet (40 mg total) daily by mouth. 90 tablet 0  . pregabalin (LYRICA) 75 MG capsule Take 1 capsule (75 mg total) 2 (two) times daily by mouth. 60 capsule 3  . promethazine (PHENERGAN) 25 MG tablet Take 25 mg by mouth every 6 (six) hours as needed for nausea or vomiting.    . ranitidine (ZANTAC) 150 MG capsule Take 1 capsule (150 mg total) at bedtime by mouth. 90 capsule 0  . sucralfate (CARAFATE) 1 g tablet Take 1 tablet (1 g total) 4 (four) times daily by mouth. 120 tablet 3  . Borage, Borago officinalis, (BORAGE OIL) 500 MG CAPS Take 500 capsules daily by mouth.    . cyclobenzaprine (FLEXERIL) 5 MG tablet Take 5 mg 3 (three) times daily as needed by mouth.     . Potassium Chloride ER 20 MEQ TBCR Take 20  mEq 2 (two) times daily by mouth.     No facility-administered medications prior to visit.     Allergies  Allergen Reactions  . Asa [Aspirin]   . Sulfa Antibiotics     Family History  Problem Relation Age of Onset  . Diabetes Mother   . Diabetes Father   . Heart disease Father   . Diabetes Sister   . Heart disease Son     Social History   Tobacco Use  . Smoking status: Never Smoker  .  Smokeless tobacco: Never Used  Substance Use Topics  . Alcohol use: No    Alcohol/week: 0.0 oz  . Drug use: No    ROS: As per history of present illness, otherwise negative  BP 108/70   Pulse 78   Ht 5' 3.5" (1.613 m)   Wt 118 lb 6 oz (53.7 kg)   BMI 20.64 kg/m  Constitutional: Well-developed and well-nourished. No distress. HEENT: Normocephalic and atraumatic. Oropharynx is clear and moist. Conjunctivae are normal.  No scleral icterus. Neck: Neck supple. Trachea midline. Cardiovascular: Normal rate, regular rhythm and intact distal pulses. No M/R/G Pulmonary/chest: Effort normal and breath sounds normal.  Fine bibasilar crackles without rhonchi or wheezing Abdominal: Soft, nontender, nondistended. Bowel sounds active throughout. There are no masses palpable. No hepatosplenomegaly. Extremities: no clubbing, cyanosis, 1+ pitting edema to the midshin Neurological: Alert and oriented to person place and time. Skin: Skin is warm and dry.  Psychiatric: Normal mood and affect. Behavior is normal.  RELEVANT LABS AND IMAGING: CBC    Component Value Date/Time   WBC 6.8 11/11/2017 1200   RBC 4.10 11/11/2017 1200   HGB 11.1 (L) 11/11/2017 1200   HGB 10.9 (L) 10/29/2017 1515   HCT 35.3 (L) 11/11/2017 1200   HCT 36.2 10/29/2017 1515   PLT 537.0 (H) 11/11/2017 1200   PLT 680 (H) 10/29/2017 1515   MCV 86.1 11/11/2017 1200   MCV 87 10/29/2017 1515   MCH 26.3 (L) 10/29/2017 1515   MCHC 31.4 11/11/2017 1200   RDW 19.0 (H) 11/11/2017 1200   RDW 19.0 (H) 10/29/2017 1515   LYMPHSABS 1.4  11/11/2017 1200   LYMPHSABS 1.9 10/29/2017 1515   MONOABS 0.6 11/11/2017 1200   EOSABS 0.3 11/11/2017 1200   EOSABS 0.3 10/29/2017 1515   BASOSABS 0.0 11/11/2017 1200   BASOSABS 0.1 10/29/2017 1515    CMP     Component Value Date/Time   NA 134 10/29/2017 1515   K 5.7 (H) 10/29/2017 1515   CL 94 (L) 10/29/2017 1515   CO2 26 10/29/2017 1515   GLUCOSE 180 (H) 10/29/2017 1515   BUN 16 10/29/2017 1515   CREATININE 0.52 (L) 10/29/2017 1515   CALCIUM 8.6 (L) 10/29/2017 1515   PROT 5.9 (L) 10/29/2017 1515   ALBUMIN 3.2 (L) 10/29/2017 1515   AST 110 (H) 10/29/2017 1515   ALT 37 (H) 10/29/2017 1515   ALKPHOS 337 (H) 10/29/2017 1515   BILITOT 0.4 10/29/2017 1515   GFRNONAA 92 10/29/2017 1515   GFRAA 107 10/29/2017 1515    ASSESSMENT/PLAN: 76 year old female with a history of GERD, peptic ulcer disease status post partial gastrectomy for ulcer disease, relatively recent diagnosis of gastroparesis, insulin-dependent type 2 diabetes, hypertension, peripheral neuropathy, anemia who seen in consultation at the request of Dr. Pamella Pert to establish care regarding her gastroparesis and prior significant GI symptoms  1.  Gastroparesis --this diagnosis would make sense for her given her diabetes and neuropathy.  We discussed the diagnosis of gastroparesis at length today and we went over the gastroparesis diet in detail.  Fortunately her gastroparesis symptoms have improved with small more frequent meals and a modified diet.  I do not see reason for a motility drug at present.  I do want to review her GI records and recent EGD.  We have requested these records today.  No current nausea, vomiting or abdominal pain.  2.  Elevated liver enzymes --unclear etiology.  Repeat liver enzymes today.  Repeat CBC and INR.  Check viral hepatitis serologies and  iron studies.  We will address this issue more at follow-up.  3.  GERD and history of gastric ulcer disease --records requested.  For now we will continue  pantoprazole 40 mg in the morning, ranitidine 150 mg in the evening and Carafate 4 times daily.  They reduce Carafate at follow-up depending on overall how she is doing.  She is reminded to avoid all NSAIDs.  4.  History of anemia --unclear etiology.  Check iron studies.  Would like her to follow-up in 3 months for all of these issues, sooner if needed    GB:TDVVOHYW, Lilia Argue, Md [address] [place], [place] [address]

## 2017-11-11 NOTE — Patient Instructions (Addendum)
Please follow up with Dr Hilarie Fredrickson in 3 months.  Your physician has requested that you go to the basement for lab work before leaving today.  Follow the gastroparesis diet given to you today.  We have requested your previous GI records from Dr Eber Jones.  If you are age 77 or older, your body mass index should be between 23-30. Your Body mass index is 20.64 kg/m. If this is out of the aforementioned range listed, please consider follow up with your Primary Care Provider.  If you are age 58 or younger, your body mass index should be between 19-25. Your Body mass index is 20.64 kg/m. If this is out of the aformentioned range listed, please consider follow up with your Primary Care Provider.

## 2017-11-12 ENCOUNTER — Other Ambulatory Visit: Payer: Self-pay

## 2017-11-12 ENCOUNTER — Other Ambulatory Visit (INDEPENDENT_AMBULATORY_CARE_PROVIDER_SITE_OTHER): Payer: Medicare Other

## 2017-11-12 DIAGNOSIS — K219 Gastro-esophageal reflux disease without esophagitis: Secondary | ICD-10-CM

## 2017-11-12 LAB — COMPREHENSIVE METABOLIC PANEL
ALBUMIN: 2.9 g/dL — AB (ref 3.5–5.2)
ALK PHOS: 196 U/L — AB (ref 39–117)
ALT: 33 U/L (ref 0–35)
AST: 99 U/L — ABNORMAL HIGH (ref 0–37)
BUN: 18 mg/dL (ref 6–23)
CO2: 25 mEq/L (ref 19–32)
Calcium: 8.6 mg/dL (ref 8.4–10.5)
Chloride: 99 mEq/L (ref 96–112)
Creatinine, Ser: 0.51 mg/dL (ref 0.40–1.20)
GFR: 124.06 mL/min (ref >60.00)
GLUCOSE: 121 mg/dL — AB (ref 70–99)
POTASSIUM: 3.9 meq/L (ref 3.5–5.1)
Sodium: 135 mEq/L (ref 135–145)
TOTAL PROTEIN: 6.1 g/dL (ref 6.0–8.3)
Total Bilirubin: 0.4 mg/dL (ref 0.2–1.2)

## 2017-11-12 LAB — HEPATITIS B SURFACE ANTIGEN: HEP B S AG: NONREACTIVE

## 2017-11-12 LAB — HEPATITIS B CORE ANTIBODY, TOTAL: Hep B Core Total Ab: NONREACTIVE

## 2017-11-12 LAB — HEPATITIS C ANTIBODY
Hepatitis C Ab: NONREACTIVE
SIGNAL TO CUT-OFF: 0.01

## 2017-11-12 LAB — HEPATITIS B SURFACE ANTIBODY,QUALITATIVE: Hep B S Ab: NONREACTIVE

## 2017-11-12 NOTE — Telephone Encounter (Signed)
Verbal called in

## 2017-11-13 ENCOUNTER — Other Ambulatory Visit: Payer: Self-pay

## 2017-11-13 DIAGNOSIS — E1165 Type 2 diabetes mellitus with hyperglycemia: Secondary | ICD-10-CM | POA: Diagnosis not present

## 2017-11-13 DIAGNOSIS — R945 Abnormal results of liver function studies: Principal | ICD-10-CM

## 2017-11-13 DIAGNOSIS — R7989 Other specified abnormal findings of blood chemistry: Secondary | ICD-10-CM

## 2017-11-13 DIAGNOSIS — E1143 Type 2 diabetes mellitus with diabetic autonomic (poly)neuropathy: Secondary | ICD-10-CM | POA: Diagnosis not present

## 2017-11-13 NOTE — Addendum Note (Signed)
Addended by: Zebedee Iba on: 11/13/2017 03:10 PM   Modules accepted: Orders

## 2017-11-18 ENCOUNTER — Telehealth: Payer: Self-pay

## 2017-11-18 ENCOUNTER — Ambulatory Visit: Payer: Medicare Other | Admitting: Cardiology

## 2017-11-18 NOTE — Telephone Encounter (Signed)
11/26 AW [place] per Dr.Santiago request

## 2017-11-19 DIAGNOSIS — E1165 Type 2 diabetes mellitus with hyperglycemia: Secondary | ICD-10-CM | POA: Diagnosis not present

## 2017-11-19 DIAGNOSIS — E1143 Type 2 diabetes mellitus with diabetic autonomic (poly)neuropathy: Secondary | ICD-10-CM | POA: Diagnosis not present

## 2017-11-21 ENCOUNTER — Ambulatory Visit (HOSPITAL_COMMUNITY)
Admission: RE | Admit: 2017-11-21 | Discharge: 2017-11-21 | Disposition: A | Discharge status: Home / Self Care | Payer: Medicare Other | Source: Ambulatory Visit | Attending: Internal Medicine | Admitting: Internal Medicine

## 2017-11-21 ENCOUNTER — Telehealth: Payer: Self-pay | Admitting: Internal Medicine

## 2017-11-21 ENCOUNTER — Other Ambulatory Visit: Payer: Self-pay

## 2017-11-21 DIAGNOSIS — R932 Abnormal findings on diagnostic imaging of liver and biliary tract: Secondary | ICD-10-CM | POA: Insufficient documentation

## 2017-11-21 DIAGNOSIS — K8020 Calculus of gallbladder without cholecystitis without obstruction: Secondary | ICD-10-CM | POA: Diagnosis not present

## 2017-11-21 DIAGNOSIS — R945 Abnormal results of liver function studies: Secondary | ICD-10-CM

## 2017-11-21 DIAGNOSIS — E1165 Type 2 diabetes mellitus with hyperglycemia: Secondary | ICD-10-CM | POA: Diagnosis not present

## 2017-11-21 DIAGNOSIS — E1143 Type 2 diabetes mellitus with diabetic autonomic (poly)neuropathy: Secondary | ICD-10-CM | POA: Diagnosis not present

## 2017-11-21 DIAGNOSIS — R7989 Other specified abnormal findings of blood chemistry: Secondary | ICD-10-CM | POA: Diagnosis not present

## 2017-11-21 DIAGNOSIS — K760 Fatty (change of) liver, not elsewhere classified: Secondary | ICD-10-CM

## 2017-11-21 NOTE — Addendum Note (Signed)
Addended by: Rutherford Guys on: 11/21/2017 08:03 AM   Modules accepted: Level of Service

## 2017-11-22 DIAGNOSIS — E1143 Type 2 diabetes mellitus with diabetic autonomic (poly)neuropathy: Secondary | ICD-10-CM | POA: Diagnosis not present

## 2017-11-22 DIAGNOSIS — E1165 Type 2 diabetes mellitus with hyperglycemia: Secondary | ICD-10-CM | POA: Diagnosis not present

## 2017-11-22 NOTE — Telephone Encounter (Signed)
See result note.  

## 2017-11-25 ENCOUNTER — Telehealth: Payer: Self-pay | Admitting: Internal Medicine

## 2017-11-25 ENCOUNTER — Telehealth: Payer: Self-pay | Admitting: Family Medicine

## 2017-11-25 DIAGNOSIS — E1165 Type 2 diabetes mellitus with hyperglycemia: Secondary | ICD-10-CM | POA: Diagnosis not present

## 2017-11-25 DIAGNOSIS — E1143 Type 2 diabetes mellitus with diabetic autonomic (poly)neuropathy: Secondary | ICD-10-CM | POA: Diagnosis not present

## 2017-11-25 NOTE — Telephone Encounter (Signed)
Copied from [place]. Topic: Quick Communication - See Telephone Encounter >> Nov 25, 2017 11:16 AM Boyd Kerbs wrote: CRM for notification. See Telephone encounter for:   Judith [name]  [phone] wants to advise doctor that due to visit with doctor last week and having problem with liver they are extending visits for the next    3 weeks until can be home alone 11/25/17.

## 2017-11-25 NOTE — Telephone Encounter (Signed)
Notes recorded by [name], MD on 11/22/2017 at 9:38 AM EST Ulice Dash, I think EUS would be a very good next step here. I'd like to plan with +/- ERCP at the same time. Can you let her know about the plans and we'll get it scheduled. Thanks  Henslee Lottman, She needs upper EUS +/- ERCP same time for elevated liver tests, gallstones, dilated bile duct. Next available Thursday time spot. Thanks

## 2017-11-26 ENCOUNTER — Telehealth: Payer: Self-pay | Admitting: Internal Medicine

## 2017-11-26 ENCOUNTER — Other Ambulatory Visit: Payer: Self-pay

## 2017-11-26 DIAGNOSIS — K8020 Calculus of gallbladder without cholecystitis without obstruction: Secondary | ICD-10-CM

## 2017-11-26 DIAGNOSIS — R7989 Other specified abnormal findings of blood chemistry: Secondary | ICD-10-CM

## 2017-11-26 DIAGNOSIS — R945 Abnormal results of liver function studies: Principal | ICD-10-CM

## 2017-11-26 DIAGNOSIS — K838 Other specified diseases of biliary tract: Secondary | ICD-10-CM

## 2017-11-26 NOTE — Telephone Encounter (Signed)
EUS/ERCP  scheduled, pt instructed and medications reviewed.  Patient instructions sent via My Chart.  Patient to call with any questions or concerns.

## 2017-11-26 NOTE — Telephone Encounter (Signed)
noted 

## 2017-11-26 NOTE — Telephone Encounter (Signed)
Spoke with pts daughter and let her know she should be hearing from Dr. Eugenia Pancoast nurse Patty to get the EUS scheduled.

## 2017-11-27 DIAGNOSIS — E1165 Type 2 diabetes mellitus with hyperglycemia: Secondary | ICD-10-CM | POA: Diagnosis not present

## 2017-11-27 DIAGNOSIS — E1143 Type 2 diabetes mellitus with diabetic autonomic (poly)neuropathy: Secondary | ICD-10-CM | POA: Diagnosis not present

## 2017-11-28 ENCOUNTER — Other Ambulatory Visit: Payer: Self-pay

## 2017-11-28 ENCOUNTER — Ambulatory Visit (INDEPENDENT_AMBULATORY_CARE_PROVIDER_SITE_OTHER): Payer: Medicare Other | Admitting: Family Medicine

## 2017-11-28 ENCOUNTER — Other Ambulatory Visit: Payer: Self-pay | Admitting: Family Medicine

## 2017-11-28 ENCOUNTER — Encounter: Payer: Self-pay | Admitting: Family Medicine

## 2017-11-28 VITALS — BP 130/84 | HR 68 | Temp 96.8°F | Ht 62.99 in | Wt 112.0 lb

## 2017-11-28 DIAGNOSIS — Z950 Presence of cardiac pacemaker: Secondary | ICD-10-CM | POA: Diagnosis not present

## 2017-11-28 DIAGNOSIS — K529 Noninfective gastroenteritis and colitis, unspecified: Secondary | ICD-10-CM

## 2017-11-28 DIAGNOSIS — R748 Abnormal levels of other serum enzymes: Secondary | ICD-10-CM | POA: Diagnosis not present

## 2017-11-28 DIAGNOSIS — M47812 Spondylosis without myelopathy or radiculopathy, cervical region: Secondary | ICD-10-CM | POA: Diagnosis not present

## 2017-11-28 DIAGNOSIS — G629 Polyneuropathy, unspecified: Secondary | ICD-10-CM | POA: Diagnosis not present

## 2017-11-28 DIAGNOSIS — E118 Type 2 diabetes mellitus with unspecified complications: Secondary | ICD-10-CM | POA: Diagnosis not present

## 2017-11-28 DIAGNOSIS — Z9181 History of falling: Secondary | ICD-10-CM

## 2017-11-28 DIAGNOSIS — Z794 Long term (current) use of insulin: Secondary | ICD-10-CM | POA: Diagnosis not present

## 2017-11-28 MED ORDER — CYCLOBENZAPRINE HCL 5 MG PO TABS: 5.0000 mg | ORAL_TABLET | Freq: Three times a day (TID) | ORAL | 1 refills | Status: DC | PRN

## 2017-11-28 MED ORDER — GABAPENTIN 100 MG PO CAPS: ORAL_CAPSULE | ORAL | 3 refills | Status: DC

## 2017-11-28 NOTE — H&P (View-Only) (Signed)
12/6/201811:24 AM  Judith [name] Oct 31, 1941, 76 y.o. female [id number]  Chief Complaint  Patient presents with  . Fall    follow up. Still having pain from fall. Has been taken a medication recieved from rehab in [place] Cyclobenzopine 60m. Wants to discuss contfinuing this med. Also, GI doctor asked that she take motrin instead of tylenol for pain. Wants to also inform doctor about the enalrge liver. Endoscopy on 12/12/17    HPI:   Patient is a 76y.o. female who presents today for follow-up.  Patient Care Team: SRutherford Guys MD as PCP - General (Family Medicine)  JMilus Banister MD and PZenovia Jarred MD - Gastroenterology [place], MDani Gobble MD - Cardiology  Patient had pacemaker interrogated, no changes made, fu 1 year  GI has been working up abnormal LFTs,  [place] fatty liver +/- hepatocellular disease and + GB and mildly dilated CBD She is scheduled for EUS on 12/20 No tylenol per GI, minimal Ibu given h/o gastritis and gastroparesis She is eating well, sometimes double portions, wo abd pain, n/v  She is almost done with PT, actively engaged in HEP She has fallen out of her new bed twice, bed is very tall, middle of the night, trying to go to the restroom, having 3-4 loose BM at night, still on mag oxide She uses a walker with chair  She is requesting refill of flexeril, originally rx at inpt rehab, tolerating well, helps with neck pain, known OA.  Also wondering is something else for neuropathic pain, has only tried lyrica, very expensive, partial relief, having mild flare-up of left sided sciatica, has had before  She checks cbgs 3 x day. avg 172 for this past week. Denies any lows, last a1c 6.6 (10/2017)  [facility] 11/28/2017 10/29/2017 04/05/2016  Decreased Interest 0 0 0  Down, Depressed, Hopeless 0 0 0  PHQ - 2 Score 0 0 0    Allergies  Allergen Reactions  . Asa [Aspirin]   . Sulfa Antibiotics     Prior to Admission medications   Medication  Sig Start Date End Date Taking? Authorizing Provider  blood glucose meter kit and supplies KIT Dispense based on patient and insurance preference. Check capillary glucose three times a day, before meals.  Dx E11.65, Z79.4 10/29/17  Yes SRutherford Guys MD  carvedilol (COREG) 12.5 MG tablet Take 1 tablet (12.5 mg total) 2 (two) times daily with a meal by mouth. 10/30/17  Yes SRutherford Guys MD  DULoxetine (CYMBALTA) 60 MG capsule Take 1 capsule (60 mg total) daily by mouth. 10/30/17  Yes SRutherford Guys MD  Insulin Glargine (LANTUS SOLOSTAR) 100 UNIT/ML Solostar Pen Inject 10 Units daily at 10 pm into the skin. 10/31/17  Yes SRutherford Guys MD  insulin lispro (HUMALOG KWIKPEN) 100 UNIT/ML KiwkPen 3 units plus sliding scale of 2 units per each 50 above glucose reading of 200, average TDD 15 units., 10/31/17  Yes SRutherford Guys MD  losartan (COZAAR) 25 MG tablet Take 1 tablet (25 mg total) daily by mouth. 10/30/17  Yes SRutherford Guys MD  magnesium oxide (MAG-OX) 400 MG tablet Take 400 mg daily by mouth.   Yes [provider]  Multiple Vitamin (MULTIVITAMIN) tablet Take 1 tablet daily by mouth.   Yes [provider]  pantoprazole (PROTONIX) 40 MG tablet Take 1 tablet (40 mg total) daily by mouth. 10/30/17  Yes SRutherford Guys MD  pregabalin (LYRICA) 75 MG capsule Take 1 capsule (  75 mg total) 2 (two) times daily by mouth. 10/30/17  Yes Rutherford Guys, MD  promethazine (PHENERGAN) 25 MG tablet Take 25 mg by mouth every 6 (six) hours as needed for nausea or vomiting.   Yes [provider]  ranitidine (ZANTAC) 150 MG capsule Take 1 capsule (150 mg total) at bedtime by mouth. 10/30/17  Yes Rutherford Guys, MD  sucralfate (CARAFATE) 1 g tablet Take 1 tablet (1 g total) 4 (four) times daily by mouth. 10/30/17  Yes Rutherford Guys, MD  acetaminophen (TYLENOL) 500 MG tablet Take 500 mg every 8 (eight) hours as needed by mouth.     [provider]  Insulin Pen Needle  (B-D ULTRAFINE III SHORT PEN) 31G X 8 MM MISC To use as directed with insulin, four times a day. Dx E11.9, Z79.4 Patient not taking: Reported on 11/28/2017 11/08/17   Rutherford Guys, MD  lovastatin (MEVACOR) 40 MG tablet Take 40 mg by mouth at bedtime.    [provider]    Past Medical History:  Diagnosis Date  . Anemia   . Arthritis   . Biliary gastritis   . Depression   . Diabetes mellitus type 2 with complications ([place])   . Duodenal ulcer disease   . Encounter for long-term (current) use of insulin ([place])   . Gastric ulcer   . Gastroparesis    severe  . GERD (gastroesophageal reflux disease)   . Gout   . Hypertension   . Hypokalemia, gastrointestinal losses    secondary to severe gastroparesis  . Peripheral neuropathy   . Presence of cardiac pacemaker   . S/P partial gastrectomy    due to severe gastric and gudoenal ulcers  . Seasonal allergies   . UTI (urinary tract infection)     Past Surgical History:  Procedure Laterality Date  . CORONARY ARTERY BYPASS GRAFT    . JOINT REPLACEMENT    . pace maker    . PARTIAL GASTRECTOMY      Social History   Tobacco Use  . Smoking status: Never Smoker  . Smokeless tobacco: Never Used  Substance Use Topics  . Alcohol use: No    Alcohol/week: 0.0 oz    Family History  Problem Relation Age of Onset  . Diabetes Mother   . Diabetes Father   . Heart disease Father   . Diabetes Sister   . Heart disease Son     Review of Systems  Constitutional: Negative for chills and fever.  Respiratory: Negative for cough and shortness of breath.   Cardiovascular: Negative for chest pain, palpitations and leg swelling.  Gastrointestinal: Negative for abdominal pain, nausea and vomiting.     OBJECTIVE:  Blood pressure 130/84, pulse 68, temperature (!) 96.8 F (36 C), temperature source Oral, height 5' 2.99" (1.6 m), weight 112 lb (50.8 kg), SpO2 96 %.  Physical Exam  Constitutional: She is oriented to person, place,  and time and well-developed, well-nourished, and in no distress.  HENT:  Head: Normocephalic and atraumatic.  Mouth/Throat: Oropharynx is clear and moist. No oropharyngeal exudate.  Eyes: EOM are normal. Pupils are equal, round, and reactive to light. No scleral icterus.  Neck: Neck supple.  Cardiovascular: Normal rate, regular rhythm and normal heart sounds. Exam reveals no gallop and no friction rub.  No murmur heard. Pulmonary/Chest: Effort normal and breath sounds normal. She has no wheezes. She has no rales.  Musculoskeletal: She exhibits no edema.  Neurological: She is alert and oriented to  person, place, and time.  Skin: Skin is warm and dry.  Psychiatric: Mood and affect normal.    ASSESSMENT and PLAN  1. Type 2 diabetes mellitus with complication, with long-term current use of insulin (HCC) Avg cbg slightly above goal. Discussed dietary changes. Mindful of portions and gastroparesis.   2. Abnormal liver enzymes Per GI, has EUS scheuled for later this month  3. Peripheral polyneuropathy Discussed trial of gabapentin, new med r/se/b reviewed. Discussed titration.   4. Presence of cardiac pacemaker Evaluated by cards, for complete heart block, fu 1 year  5. Frequent stools Discussed stopping mag oxide  6. History of recent fall Discussed safety measures at home, consider lowering bed or wide step stool with rail, night lights. Cont with PT HEP to help with regaining strength and mobility.  7. Cervical spine arthritis Will refill flexeril as it is helping and has not had any side effects. Discussed not taking with gabapentin.  Other orders - cyclobenzaprine (FLEXERIL) 5 MG tablet; Take 1 tablet (5 mg total) by mouth 3 (three) times daily as needed for muscle spasms. - gabapentin (NEURONTIN) 100 MG capsule; Take 1 capsule in AM and afternoon and take 3 capsules at bedtime - glucose blood test strips (one touch ultra)   Return in about 4 weeks (around  12/26/2017).    Rutherford Guys, MD [facility] [place], [place] [address] Ph.  [phone] Fax [phone]

## 2017-11-28 NOTE — Patient Instructions (Signed)
     IF you received an x-ray today, you will receive an invoice from [facility]. Please contact [facility] at [phone] with questions or concerns regarding your invoice.   IF you received labwork today, you will receive an invoice from [facility]. Please contact [facility] at [phone] with questions or concerns regarding your invoice.   Our billing staff will not be able to assist you with questions regarding bills from these companies.  You will be contacted with the lab results as soon as they are available. The fastest way to get your results is to activate your My Chart account. Instructions are located on the last page of this paperwork. If you have not heard from us regarding the results in 2 weeks, please contact this office.     

## 2017-11-28 NOTE — Progress Notes (Addendum)
12/6/201811:24 AM  Judith [name] Oct 31, 1941, 76 y.o. female [id number]  Chief Complaint  Patient presents with  . Fall    follow up. Still having pain from fall. Has been taken a medication recieved from rehab in [place] Cyclobenzopine 60m. Wants to discuss contfinuing this med. Also, GI doctor asked that she take motrin instead of tylenol for pain. Wants to also inform doctor about the enalrge liver. Endoscopy on 12/12/17    HPI:   Patient is a 76y.o. female who presents today for follow-up.  Patient Care Team: SRutherford Guys MD as PCP - General (Family Medicine)  JMilus Banister MD and PZenovia Jarred MD - Gastroenterology Cro[place], MDani Gobble MD - Cardiology  Patient had pacemaker interrogated, no changes made, fu 1 year  GI has been working up abnormal LFTs,  [place] fatty liver +/- hepatocellular disease and + GB and mildly dilated CBD She is scheduled for EUS on 12/20 No tylenol per GI, minimal Ibu given h/o gastritis and gastroparesis She is eating well, sometimes double portions, wo abd pain, n/v  She is almost done with PT, actively engaged in HEP She has fallen out of her new bed twice, bed is very tall, middle of the night, trying to go to the restroom, having 3-4 loose BM at night, still on mag oxide She uses a walker with chair  She is requesting refill of flexeril, originally rx at inpt rehab, tolerating well, helps with neck pain, known OA.  Also wondering is something else for neuropathic pain, has only tried lyrica, very expensive, partial relief, having mild flare-up of left sided sciatica, has had before  She checks cbgs 3 x day. avg 172 for this past week. Denies any lows, last a1c 6.6 (10/2017)  [facility] 11/28/2017 10/29/2017 04/05/2016  Decreased Interest 0 0 0  Down, Depressed, Hopeless 0 0 0  PHQ - 2 Score 0 0 0    Allergies  Allergen Reactions  . Asa [Aspirin]   . Sulfa Antibiotics     Prior to Admission medications   Medication  Sig Start Date End Date Taking? Authorizing Provider  blood glucose meter kit and supplies KIT Dispense based on patient and insurance preference. Check capillary glucose three times a day, before meals.  Dx E11.65, Z79.4 10/29/17  Yes SRutherford Guys MD  carvedilol (COREG) 12.5 MG tablet Take 1 tablet (12.5 mg total) 2 (two) times daily with a meal by mouth. 10/30/17  Yes SRutherford Guys MD  DULoxetine (CYMBALTA) 60 MG capsule Take 1 capsule (60 mg total) daily by mouth. 10/30/17  Yes SRutherford Guys MD  Insulin Glargine (LANTUS SOLOSTAR) 100 UNIT/ML Solostar Pen Inject 10 Units daily at 10 pm into the skin. 10/31/17  Yes SRutherford Guys MD  insulin lispro (HUMALOG KWIKPEN) 100 UNIT/ML KiwkPen 3 units plus sliding scale of 2 units per each 50 above glucose reading of 200, average TDD 15 units., 10/31/17  Yes SRutherford Guys MD  losartan (COZAAR) 25 MG tablet Take 1 tablet (25 mg total) daily by mouth. 10/30/17  Yes SRutherford Guys MD  magnesium oxide (MAG-OX) 400 MG tablet Take 400 mg daily by mouth.   Yes [provider]  Multiple Vitamin (MULTIVITAMIN) tablet Take 1 tablet daily by mouth.   Yes [provider]  pantoprazole (PROTONIX) 40 MG tablet Take 1 tablet (40 mg total) daily by mouth. 10/30/17  Yes SRutherford Guys MD  pregabalin (LYRICA) 75 MG capsule Take 1 capsule (  75 mg total) 2 (two) times daily by mouth. 10/30/17  Yes Rutherford Guys, MD  promethazine (PHENERGAN) 25 MG tablet Take 25 mg by mouth every 6 (six) hours as needed for nausea or vomiting.   Yes [provider]  ranitidine (ZANTAC) 150 MG capsule Take 1 capsule (150 mg total) at bedtime by mouth. 10/30/17  Yes Rutherford Guys, MD  sucralfate (CARAFATE) 1 g tablet Take 1 tablet (1 g total) 4 (four) times daily by mouth. 10/30/17  Yes Rutherford Guys, MD  acetaminophen (TYLENOL) 500 MG tablet Take 500 mg every 8 (eight) hours as needed by mouth.     [provider]  Insulin Pen Needle  (B-D ULTRAFINE III SHORT PEN) 31G X 8 MM MISC To use as directed with insulin, four times a day. Dx E11.9, Z79.4 Patient not taking: Reported on 11/28/2017 11/08/17   Rutherford Guys, MD  lovastatin (MEVACOR) 40 MG tablet Take 40 mg by mouth at bedtime.    [provider]    Past Medical History:  Diagnosis Date  . Anemia   . Arthritis   . Biliary gastritis   . Depression   . Diabetes mellitus type 2 with complications ([place])   . Duodenal ulcer disease   . Encounter for long-term (current) use of insulin ([place])   . Gastric ulcer   . Gastroparesis    severe  . GERD (gastroesophageal reflux disease)   . Gout   . Hypertension   . Hypokalemia, gastrointestinal losses    secondary to severe gastroparesis  . Peripheral neuropathy   . Presence of cardiac pacemaker   . S/P partial gastrectomy    due to severe gastric and gudoenal ulcers  . Seasonal allergies   . UTI (urinary tract infection)     Past Surgical History:  Procedure Laterality Date  . CORONARY ARTERY BYPASS GRAFT    . JOINT REPLACEMENT    . pace maker    . PARTIAL GASTRECTOMY      Social History   Tobacco Use  . Smoking status: Never Smoker  . Smokeless tobacco: Never Used  Substance Use Topics  . Alcohol use: No    Alcohol/week: 0.0 oz    Family History  Problem Relation Age of Onset  . Diabetes Mother   . Diabetes Father   . Heart disease Father   . Diabetes Sister   . Heart disease Son     Review of Systems  Constitutional: Negative for chills and fever.  Respiratory: Negative for cough and shortness of breath.   Cardiovascular: Negative for chest pain, palpitations and leg swelling.  Gastrointestinal: Negative for abdominal pain, nausea and vomiting.     OBJECTIVE:  Blood pressure 130/84, pulse 68, temperature (!) 96.8 F (36 C), temperature source Oral, height 5' 2.99" (1.6 m), weight 112 lb (50.8 kg), SpO2 96 %.  Physical Exam  Constitutional: She is oriented to person, place,  and time and well-developed, well-nourished, and in no distress.  HENT:  Head: Normocephalic and atraumatic.  Mouth/Throat: Oropharynx is clear and moist. No oropharyngeal exudate.  Eyes: EOM are normal. Pupils are equal, round, and reactive to light. No scleral icterus.  Neck: Neck supple.  Cardiovascular: Normal rate, regular rhythm and normal heart sounds. Exam reveals no gallop and no friction rub.  No murmur heard. Pulmonary/Chest: Effort normal and breath sounds normal. She has no wheezes. She has no rales.  Musculoskeletal: She exhibits no edema.  Neurological: She is alert and oriented to  person, place, and time.  Skin: Skin is warm and dry.  Psychiatric: Mood and affect normal.    ASSESSMENT and PLAN  1. Type 2 diabetes mellitus with complication, with long-term current use of insulin ([facility]) Avg cbg slightly above goal. Discussed dietary changes. Mindful of portions and gastroparesis.   2. Abnormal liver enzymes Per GI, has EUS scheuled for later this month  3. Peripheral polyneuropathy Discussed trial of gabapentin, new med r/se/b reviewed. Discussed titration.   4. Presence of cardiac pacemaker Evaluated by cards, for complete heart block, fu 1 year  5. Frequent stools Discussed stopping mag oxide  6. History of recent fall Discussed safety measures at home, consider lowering bed or wide step stool with rail, night lights. Cont with PT HEP to help with regaining strength and mobility.  7. Cervical spine arthritis Will refill flexeril as it is helping and has not had any side effects. Discussed not taking with gabapentin.  Other orders - cyclobenzaprine (FLEXERIL) 5 MG tablet; Take 1 tablet (5 mg total) by mouth 3 (three) times daily as needed for muscle spasms. - gabapentin (NEURONTIN) 100 MG capsule; Take 1 capsule in AM and afternoon and take 3 capsules at bedtime - glucose blood test strips (one touch ultra)   Return in about 4 weeks (around  12/26/2017).    Rutherford Guys, MD [facility] [place],  [address] Ph.  [phone] Fax [phone]

## 2017-11-29 ENCOUNTER — Telehealth: Payer: Self-pay

## 2017-11-29 NOTE — Telephone Encounter (Signed)
Spoke with Cathi Roan, CMA. Blood sugar log has been accidentally disposed of by provider. New blood sugar log placed at front desk for patient pickup at [address].   Phone call to patient. Spoke with [name], patient's daughter. [name] notified log disposed of and new log is available for pickup. [name] agreeable. Closing note.

## 2017-11-29 NOTE — Telephone Encounter (Signed)
Copied from [place]. Topic: Inquiry >> Nov 28, 2017  1:52 PM Oliver Pila B wrote: Reason for CRM: pt called b/c she left a blood sugar calendar log with Dr. Pamella Pert and is needing to come pick it up, contact pt to let her know its available to come pick up today  >> Nov 28, 2017  4:44 PM Ivar Drape wrote: Patient called and said she will come pick up the blood sugar log tomorrow.

## 2017-11-30 MED ORDER — GLUCOSE BLOOD VI STRP: ORAL_STRIP | 11 refills | Status: DC

## 2017-11-30 NOTE — Addendum Note (Signed)
Addended by: Rutherford Guys on: 11/30/2017 10:13 AM   Modules accepted: Orders

## 2017-12-02 ENCOUNTER — Encounter (HOSPITAL_COMMUNITY): Payer: Self-pay | Admitting: Emergency Medicine

## 2017-12-02 ENCOUNTER — Other Ambulatory Visit: Payer: Self-pay

## 2017-12-04 DIAGNOSIS — E1143 Type 2 diabetes mellitus with diabetic autonomic (poly)neuropathy: Secondary | ICD-10-CM | POA: Diagnosis not present

## 2017-12-04 DIAGNOSIS — E1165 Type 2 diabetes mellitus with hyperglycemia: Secondary | ICD-10-CM | POA: Diagnosis not present

## 2017-12-05 ENCOUNTER — Telehealth: Payer: Self-pay

## 2017-12-05 NOTE — Telephone Encounter (Signed)
Received fax from pharmacy needing dx code for strips. Faxed 12/05/17 to [facility] on [address].

## 2017-12-09 DIAGNOSIS — E1143 Type 2 diabetes mellitus with diabetic autonomic (poly)neuropathy: Secondary | ICD-10-CM | POA: Diagnosis not present

## 2017-12-09 DIAGNOSIS — E1165 Type 2 diabetes mellitus with hyperglycemia: Secondary | ICD-10-CM | POA: Diagnosis not present

## 2017-12-11 DIAGNOSIS — E1143 Type 2 diabetes mellitus with diabetic autonomic (poly)neuropathy: Secondary | ICD-10-CM | POA: Diagnosis not present

## 2017-12-11 DIAGNOSIS — E1165 Type 2 diabetes mellitus with hyperglycemia: Secondary | ICD-10-CM | POA: Diagnosis not present

## 2017-12-12 ENCOUNTER — Encounter (HOSPITAL_COMMUNITY): Payer: Self-pay

## 2017-12-12 ENCOUNTER — Other Ambulatory Visit: Payer: Self-pay

## 2017-12-12 ENCOUNTER — Ambulatory Visit (HOSPITAL_COMMUNITY): Payer: Medicare Other | Admitting: Registered Nurse

## 2017-12-12 ENCOUNTER — Telehealth: Payer: Self-pay

## 2017-12-12 ENCOUNTER — Encounter (HOSPITAL_COMMUNITY)
Admission: RE | Disposition: A | Discharge status: Home / Self Care | Payer: Self-pay | Source: Ambulatory Visit | Attending: Gastroenterology

## 2017-12-12 ENCOUNTER — Ambulatory Visit (HOSPITAL_COMMUNITY)
Admission: RE | Admit: 2017-12-12 | Discharge: 2017-12-12 | Disposition: A | Discharge status: Home / Self Care | Payer: Medicare Other | Source: Ambulatory Visit | Attending: Gastroenterology | Admitting: Gastroenterology

## 2017-12-12 DIAGNOSIS — K219 Gastro-esophageal reflux disease without esophagitis: Secondary | ICD-10-CM | POA: Insufficient documentation

## 2017-12-12 DIAGNOSIS — R945 Abnormal results of liver function studies: Secondary | ICD-10-CM

## 2017-12-12 DIAGNOSIS — R7989 Other specified abnormal findings of blood chemistry: Secondary | ICD-10-CM

## 2017-12-12 DIAGNOSIS — E876 Hypokalemia: Secondary | ICD-10-CM | POA: Insufficient documentation

## 2017-12-12 DIAGNOSIS — Z79899 Other long term (current) drug therapy: Secondary | ICD-10-CM | POA: Insufficient documentation

## 2017-12-12 DIAGNOSIS — I442 Atrioventricular block, complete: Secondary | ICD-10-CM | POA: Diagnosis not present

## 2017-12-12 DIAGNOSIS — K8020 Calculus of gallbladder without cholecystitis without obstruction: Secondary | ICD-10-CM

## 2017-12-12 DIAGNOSIS — E1143 Type 2 diabetes mellitus with diabetic autonomic (poly)neuropathy: Secondary | ICD-10-CM | POA: Diagnosis not present

## 2017-12-12 DIAGNOSIS — K2970 Gastritis, unspecified, without bleeding: Secondary | ICD-10-CM

## 2017-12-12 DIAGNOSIS — Z950 Presence of cardiac pacemaker: Secondary | ICD-10-CM | POA: Insufficient documentation

## 2017-12-12 DIAGNOSIS — K838 Other specified diseases of biliary tract: Secondary | ICD-10-CM

## 2017-12-12 DIAGNOSIS — K3189 Other diseases of stomach and duodenum: Secondary | ICD-10-CM

## 2017-12-12 DIAGNOSIS — M109 Gout, unspecified: Secondary | ICD-10-CM | POA: Diagnosis not present

## 2017-12-12 DIAGNOSIS — F329 Major depressive disorder, single episode, unspecified: Secondary | ICD-10-CM | POA: Insufficient documentation

## 2017-12-12 DIAGNOSIS — Z8719 Personal history of other diseases of the digestive system: Secondary | ICD-10-CM | POA: Insufficient documentation

## 2017-12-12 DIAGNOSIS — Z951 Presence of aortocoronary bypass graft: Secondary | ICD-10-CM | POA: Insufficient documentation

## 2017-12-12 DIAGNOSIS — Z794 Long term (current) use of insulin: Secondary | ICD-10-CM | POA: Insufficient documentation

## 2017-12-12 DIAGNOSIS — K2990 Gastroduodenitis, unspecified, without bleeding: Secondary | ICD-10-CM

## 2017-12-12 DIAGNOSIS — I10 Essential (primary) hypertension: Secondary | ICD-10-CM | POA: Insufficient documentation

## 2017-12-12 DIAGNOSIS — K3184 Gastroparesis: Secondary | ICD-10-CM | POA: Diagnosis not present

## 2017-12-12 DIAGNOSIS — E119 Type 2 diabetes mellitus without complications: Secondary | ICD-10-CM | POA: Diagnosis not present

## 2017-12-12 DIAGNOSIS — Z886 Allergy status to analgesic agent status: Secondary | ICD-10-CM | POA: Insufficient documentation

## 2017-12-12 DIAGNOSIS — K2960 Other gastritis without bleeding: Secondary | ICD-10-CM | POA: Diagnosis not present

## 2017-12-12 HISTORY — PX: EUS: SHX5427

## 2017-12-12 LAB — GLUCOSE, CAPILLARY: Glucose-Capillary: 125 mg/dL — ABNORMAL HIGH (ref 65–99)

## 2017-12-12 SURGERY — UPPER ENDOSCOPIC ULTRASOUND (EUS) RADIAL
Anesthesia: Monitor Anesthesia Care

## 2017-12-12 MED ORDER — SODIUM CHLORIDE 0.9 % IV SOLN: INTRAVENOUS | Status: DC

## 2017-12-12 MED ORDER — CARVEDILOL 12.5 MG PO TABS
12.5000 mg | ORAL_TABLET | Freq: Once | ORAL | Status: DC
  Filled 2017-12-12: qty 1

## 2017-12-12 MED ORDER — FENTANYL CITRATE (PF) 100 MCG/2ML IJ SOLN: 25.0000 ug | INTRAMUSCULAR | Status: DC | PRN

## 2017-12-12 MED ORDER — OXYCODONE HCL 5 MG/5ML PO SOLN: 5.0000 mg | Freq: Once | ORAL | Status: DC | PRN

## 2017-12-12 MED ORDER — FENTANYL CITRATE (PF) 100 MCG/2ML IJ SOLN
INTRAMUSCULAR | Status: AC
  Filled 2017-12-12: qty 2

## 2017-12-12 MED ORDER — GLUCAGON HCL RDNA (DIAGNOSTIC) 1 MG IJ SOLR
INTRAMUSCULAR | Status: AC
  Filled 2017-12-12: qty 1

## 2017-12-12 MED ORDER — INDOMETHACIN 50 MG RE SUPP
RECTAL | Status: AC
  Filled 2017-12-12: qty 2

## 2017-12-12 MED ORDER — ONDANSETRON HCL 4 MG/2ML IJ SOLN: 4.0000 mg | Freq: Four times a day (QID) | INTRAMUSCULAR | Status: DC | PRN

## 2017-12-12 MED ORDER — PROPOFOL 500 MG/50ML IV EMUL
INTRAVENOUS | Status: DC | PRN
  Administered 2017-12-12: 75 ug/kg/min via INTRAVENOUS

## 2017-12-12 MED ORDER — PROPOFOL 10 MG/ML IV BOLUS
INTRAVENOUS | Status: AC
  Filled 2017-12-12: qty 20

## 2017-12-12 MED ORDER — OXYCODONE HCL 5 MG PO TABS: 5.0000 mg | ORAL_TABLET | Freq: Once | ORAL | Status: DC | PRN

## 2017-12-12 MED ORDER — LACTATED RINGERS IV SOLN
INTRAVENOUS | Status: DC
  Administered 2017-12-12: 10:00:00 via INTRAVENOUS

## 2017-12-12 MED ORDER — CIPROFLOXACIN IN D5W 400 MG/200ML IV SOLN
INTRAVENOUS | Status: AC
  Filled 2017-12-12: qty 200

## 2017-12-12 MED ORDER — PROPOFOL 10 MG/ML IV BOLUS
INTRAVENOUS | Status: DC | PRN
  Administered 2017-12-12: 20 mg via INTRAVENOUS
  Administered 2017-12-12: 10 mg via INTRAVENOUS
  Administered 2017-12-12 (×2): 20 mg via INTRAVENOUS

## 2017-12-12 NOTE — Op Note (Signed)
[facility] Patient Name: Judith [name] Procedure Date: 12/12/2017 MRN: [id number] Attending MD: [name] , MD Date of Birth: Jul 10, 1941 CSN: [id number] Age: 76 Admit Type: Outpatient Procedure:                Upper EUS Indications:              Abnormal ultrasound of the abdomen (prominant CBD,                            with stones in GB; elevated liver tests) Providers:                [name], MD, Cleda Daub, RN, Alan Mulder, Technician Referring MD:             Zenovia Jarred, MD Medicines:                Monitored Anesthesia Care Complications:            No immediate complications. Estimated blood loss:                            None. Estimated Blood Loss:     Estimated blood loss: none. Procedure:                Pre-Anesthesia Assessment:                           - Prior to the procedure, a History and Physical                            was performed, and patient medications and                            allergies were reviewed. The patient's tolerance of                            previous anesthesia was also reviewed. The risks                            and benefits of the procedure and the sedation                            options and risks were discussed with the patient.                            All questions were answered, and informed consent                            was obtained. Prior Anticoagulants: The patient has                            taken no previous anticoagulant or antiplatelet  agents. ASA Grade Assessment: III - A patient with                            severe systemic disease. After reviewing the risks                            and benefits, the patient was deemed in                            satisfactory condition to undergo the procedure.                           After obtaining informed consent, the endoscope was                            passed under  direct vision. Throughout the                            procedure, the patient's blood pressure, pulse, and                            oxygen saturations were monitored continuously. The                            [id number] ([id number]) scope was introduced through                            the mouth, and advanced to the jejunum. The                            [id number] ([id number]) scope was introduced through the                            mouth, and advanced to the jejunum. The upper EUS                            was accomplished without difficulty. The patient                            tolerated the procedure well. Scope In: Scope Out: Findings:      Endoscopic findings (with radial echoendoscope and standard adult       gastroscope):      1. Large amount of retained solid food in the stomach.      2. Post surgical anatomy; appears to have Bilroth II anatomy. There was       mild non-specific gastritis near the anastomosis which was biospied to       check for H. pylori, neoplasm (which I doubt). The jejunal folds were       eroded, somewhat ulcerated (see images) near the anastomosis as well. No       deep ulcers, no stagmata of recent bleeding.      3. I was able to intubate both limbs of the anastomosis for 5-10 cm and       the musoca deeply appeared normal.  Endosonographic Finding :      1. The body and tail of pancreas was normal.      2. The main pancreatic duct was normal in the body, tail.      3. I was unable to visualize the head of pancreas or the extrahepatic       bile duct due to her post surgical BII anatomy.      4. Limited views of the liver, spleen were normal. Impression:               - Large amount of retained solid food in the                            stomach.                           - Apparent Bilroth II anatomy with adjacent gastric                            and jejunal inflammation but no frank cancer.                            Biopsies taken  from the stomach to check for H.                            pylori.                           - I was unable to visualize the head of pancreas,                            extrahepatic bile ducts due to her post surgical                            anatomy.                           - LFT elevation MAY have been from extreme tylenol                            use, she was taking 15-16 extra strength tylenol                            daily (about 8 grams). She has not had tylenol in                            12 months now. Moderate Sedation:      N/A- Per Anesthesia Care Recommendation:           - Discharge patient to home (ambulatory).                           - Await pathology results.                           - [facility] will arrange repeat liver tests                           -  You should continue to avoid excessive tylenol. Procedure Code(s):        --- Professional ---                           [phone], Esophagogastroduodenoscopy, flexible,                            transoral; with endoscopic ultrasound examination                            limited to the esophagus, stomach or duodenum, and                            adjacent structures                           61443, 53, Esophagogastroduodenoscopy, flexible,                            transoral; with biopsy, single or multiple Diagnosis Code(s):        --- Professional ---                           K31.89, Other diseases of stomach and duodenum                           R93.5, Abnormal findings on diagnostic imaging of                            other abdominal regions, including retroperitoneum CPT copyright 2016 American Medical Association. All rights reserved. The codes documented in this report are preliminary and upon coder review may  be revised to meet current compliance requirements. [name], MD 12/12/2017 11:53:45 AM This report has been signed electronically. Number of Addenda: 0

## 2017-12-12 NOTE — Transfer of Care (Signed)
Immediate Anesthesia Transfer of Care Note  Patient: Judith [name]  Procedure(s) Performed: UPPER ENDOSCOPIC ULTRASOUND (EUS) RADIAL (N/A )  Patient Location: PACU  Anesthesia Type:MAC  Level of Consciousness: awake, alert  and oriented  Airway & Oxygen Therapy: Patient Spontanous Breathing and Patient connected to nasal cannula oxygen  Post-op Assessment: Report given to RN and Post -op Vital signs reviewed and stable  Post vital signs: Reviewed and stable  Last Vitals:  Vitals:   12/12/17 0909 12/12/17 0912  BP: (!) 178/58   Pulse: 75   Resp: 12   Temp:  37.1 C    Last Pain:  Vitals:   12/12/17 0912  TempSrc: Oral  PainSc:       Patients Stated Pain Goal: 2 (09/62/83 6629)  Complications: No apparent anesthesia complications

## 2017-12-12 NOTE — Anesthesia Preprocedure Evaluation (Signed)
Anesthesia Evaluation  Patient identified by MRN, date of birth, ID band Patient awake    Reviewed: Allergy & Precautions, H&P , NPO status , Patient's Chart, lab work & pertinent test results  Airway Mallampati: II   Neck ROM: full    Dental   Pulmonary neg pulmonary ROS,    breath sounds clear to auscultation       Cardiovascular hypertension, + pacemaker  Rhythm:regular Rate:Normal     Neuro/Psych PSYCHIATRIC DISORDERS Depression  Neuromuscular disease    GI/Hepatic PUD, GERD  ,  Endo/Other  diabetes, Type 2, Insulin Dependent  Renal/GU      Musculoskeletal  (+) Arthritis ,   Abdominal   Peds  Hematology   Anesthesia Other Findings   Reproductive/Obstetrics                             Anesthesia Physical Anesthesia Plan  ASA: III  Anesthesia Plan: General   Post-op Pain Management:    Induction: Intravenous  PONV Risk Score and Plan: 3 and Ondansetron, Dexamethasone and Treatment may vary due to age or medical condition  Airway Management Planned: Oral ETT  Additional Equipment:   Intra-op Plan:   Post-operative Plan: Extubation in OR  Informed Consent: I have reviewed the patients History and Physical, chart, labs and discussed the procedure including the risks, benefits and alternatives for the proposed anesthesia with the patient or authorized representative who has indicated his/her understanding and acceptance.     Plan Discussed with: CRNA, Anesthesiologist and Surgeon  Anesthesia Plan Comments:         Anesthesia Quick Evaluation

## 2017-12-12 NOTE — Discharge Instructions (Signed)
Monitored Anesthesia Care, Care After These instructions provide you with information about caring for yourself after your procedure. Your health care provider may also give you more specific instructions. Your treatment has been planned according to current medical practices, but problems sometimes occur. Call your health care provider if you have any problems or questions after your procedure. What can I expect after the procedure? After your procedure, it is common to:  Feel sleepy for several hours.  Feel clumsy and have poor balance for several hours.  Feel forgetful about what happened after the procedure.  Have poor judgment for several hours.  Feel nauseous or vomit.  Have a sore throat if you had a breathing tube during the procedure.  Follow these instructions at home: For at least 24 hours after the procedure:   Do not: ? Participate in activities in which you could fall or become injured. ? Drive. ? Use heavy machinery. ? Drink alcohol. ? Take sleeping pills or medicines that cause drowsiness. ? Make important decisions or sign legal documents. ? Take care of children on your own.  Rest. Eating and drinking  Follow the diet that is recommended by your health care provider.  If you vomit, drink water, juice, or soup when you can drink without vomiting.  Make sure you have little or no nausea before eating solid foods. General instructions  Have a responsible adult stay with you until you are awake and alert.  Take over-the-counter and prescription medicines only as told by your health care provider.  If you smoke, do not smoke without supervision.  Keep all follow-up visits as told by your health care provider. This is important. Contact a health care provider if:  You keep feeling nauseous or you keep vomiting.  You feel light-headed.  You develop a rash.  You have a fever. Get help right away if:  You have trouble breathing. This information is  not intended to replace advice given to you by your health care provider. Make sure you discuss any questions you have with your health care provider. Document Released: 04/01/2016 Document Revised: 08/01/2016 Document Reviewed: 04/01/2016 Elsevier Interactive Patient Education  2018 [employer]. Esophagogastroduodenoscopy, Care After Refer to this sheet in the next few weeks. These instructions provide you with information about caring for yourself after your procedure. Your health care provider may also give you more specific instructions. Your treatment has been planned according to current medical practices, but problems sometimes occur. Call your health care provider if you have any problems or questions after your procedure. What can I expect after the procedure? After the procedure, it is common to have:  A sore throat.  Nausea.  Bloating.  Dizziness.  Fatigue.  Follow these instructions at home:  Do not eat or drink anything until the numbing medicine (local anesthetic) has worn off and your gag reflex has returned. You will know that the local anesthetic has worn off when you can swallow comfortably.  Do not drive for 24 hours if you received a medicine to help you relax (sedative).  If your health care provider took a tissue sample for testing during the procedure, make sure to get your test results. This is your responsibility. Ask your health care provider or the department performing the test when your results will be ready.  Keep all follow-up visits as told by your health care provider. This is important. Contact a health care provider if:  You cannot stop coughing.  You are not urinating.  You  are urinating less than usual. Get help right away if:  You have trouble swallowing.  You cannot eat or drink.  You have throat or chest pain that gets worse.  You are dizzy or light-headed.  You faint.  You have nausea or vomiting.  You have chills.  You have  a fever.  You have severe abdominal pain.  You have black, tarry, or bloody stools. This information is not intended to replace advice given to you by your health care provider. Make sure you discuss any questions you have with your health care provider. Document Released: 11/26/2012 Document Revised: 05/17/2016 Document Reviewed: 11/03/2015 Elsevier Interactive Patient Education  [employer].

## 2017-12-12 NOTE — Telephone Encounter (Signed)
-----   Message from [name], MD sent at 12/12/2017 11:55 AM EST ----- Judith [name],  Just completed EUS, EGD (full report in epic)  - Large amount of retained solid food in the stomach. - Apparent Bilroth II anatomy with adjacent gastric and jejunal inflammation but no frank cancer.  Biopsies taken from the stomach to check for H. pylori. - I was unable to visualize the head of pancreas, extrahepatic bile ducts due to her post surgical anatomy. - LFT elevation MAY have been from extreme tylenol use, she was taking 15-16 extra strength tylenol daily (about 8 grams). She has not had tylenol in 12 months now.  I'll have her come back in for repeat LFTs later this week.  Thanks  [employer], She needs lfts later this week.  thanks

## 2017-12-12 NOTE — Interval H&P Note (Signed)
History and Physical Interval Note:  12/12/2017 9:10 AM  Judith [name]  has presented today for surgery, with the diagnosis of elevated liver test, gallstones, dilated bile duct  The various methods of treatment have been discussed with the patient and family. After consideration of risks, benefits and other options for treatment, the patient has consented to  Procedure(s): ENDOSCOPIC RETROGRADE CHOLANGIOPANCREATOGRAPHY (ERCP) WITH PROPOFOL (N/A) UPPER ENDOSCOPIC ULTRASOUND (EUS) RADIAL (N/A) as a surgical intervention .  The patient's history has been reviewed, patient examined, no change in status, stable for surgery.  I have reviewed the patient's chart and labs.  Questions were answered to the patient's satisfaction.     [name]

## 2017-12-13 NOTE — Anesthesia Postprocedure Evaluation (Signed)
Anesthesia Post Note  Patient: Judith [name]  Procedure(s) Performed: UPPER ENDOSCOPIC ULTRASOUND (EUS) RADIAL (N/A )     Patient location during evaluation: PACU Anesthesia Type: MAC Level of consciousness: awake and alert Pain management: pain level controlled Vital Signs Assessment: post-procedure vital signs reviewed and stable Respiratory status: spontaneous breathing, nonlabored ventilation, respiratory function stable and patient connected to nasal cannula oxygen Cardiovascular status: stable and blood pressure returned to baseline Postop Assessment: no apparent nausea or vomiting Anesthetic complications: no    Last Vitals:  Vitals:   12/12/17 1140 12/12/17 1150  BP: (!) 156/42 (!) 167/50  Pulse: 74 75  Resp: 18 16  Temp: 36.6 C   SpO2: 99% 100%    Last Pain:  Vitals:   12/12/17 1140  TempSrc: Oral  PainSc:                  [place]

## 2017-12-15 ENCOUNTER — Encounter (HOSPITAL_COMMUNITY): Payer: Self-pay | Admitting: Gastroenterology

## 2017-12-18 ENCOUNTER — Other Ambulatory Visit (INDEPENDENT_AMBULATORY_CARE_PROVIDER_SITE_OTHER): Payer: Medicare Other

## 2017-12-18 DIAGNOSIS — R945 Abnormal results of liver function studies: Secondary | ICD-10-CM | POA: Diagnosis not present

## 2017-12-18 DIAGNOSIS — R7989 Other specified abnormal findings of blood chemistry: Secondary | ICD-10-CM

## 2017-12-18 LAB — HEPATIC FUNCTION PANEL
ALBUMIN: 2.7 g/dL — AB (ref 3.5–5.2)
ALK PHOS: 413 U/L — AB (ref 39–117)
ALT: 55 U/L — ABNORMAL HIGH (ref 0–35)
AST: 226 U/L — ABNORMAL HIGH (ref 0–37)
BILIRUBIN DIRECT: 1.1 mg/dL — AB (ref 0.0–0.3)
BILIRUBIN TOTAL: 1.4 mg/dL — AB (ref 0.2–1.2)
Total Protein: 6 g/dL (ref 6.0–8.3)

## 2017-12-19 ENCOUNTER — Other Ambulatory Visit: Payer: Medicare Other

## 2017-12-19 ENCOUNTER — Other Ambulatory Visit: Payer: Self-pay

## 2017-12-19 DIAGNOSIS — K2970 Gastritis, unspecified, without bleeding: Secondary | ICD-10-CM

## 2017-12-19 DIAGNOSIS — K2990 Gastroduodenitis, unspecified, without bleeding: Secondary | ICD-10-CM

## 2017-12-19 DIAGNOSIS — K2960 Other gastritis without bleeding: Secondary | ICD-10-CM

## 2017-12-19 DIAGNOSIS — K838 Other specified diseases of biliary tract: Secondary | ICD-10-CM

## 2017-12-20 LAB — HEPATITIS A ANTIBODY, TOTAL: Hepatitis A AB,Total: NONREACTIVE

## 2017-12-25 ENCOUNTER — Telehealth: Payer: Self-pay | Admitting: Internal Medicine

## 2017-12-25 NOTE — Telephone Encounter (Signed)
Information faxed

## 2017-12-26 ENCOUNTER — Other Ambulatory Visit: Payer: Self-pay

## 2017-12-26 ENCOUNTER — Encounter: Payer: Self-pay | Admitting: Family Medicine

## 2017-12-26 ENCOUNTER — Ambulatory Visit (INDEPENDENT_AMBULATORY_CARE_PROVIDER_SITE_OTHER): Payer: Medicare Other | Admitting: Family Medicine

## 2017-12-26 VITALS — BP 160/81 | HR 83 | Temp 98.5°F | Ht 64.17 in | Wt 111.0 lb

## 2017-12-26 DIAGNOSIS — I10 Essential (primary) hypertension: Secondary | ICD-10-CM

## 2017-12-26 DIAGNOSIS — Z794 Long term (current) use of insulin: Secondary | ICD-10-CM

## 2017-12-26 DIAGNOSIS — E1165 Type 2 diabetes mellitus with hyperglycemia: Secondary | ICD-10-CM | POA: Diagnosis not present

## 2017-12-26 DIAGNOSIS — L853 Xerosis cutis: Secondary | ICD-10-CM | POA: Diagnosis not present

## 2017-12-26 DIAGNOSIS — Z903 Acquired absence of stomach [part of]: Secondary | ICD-10-CM | POA: Diagnosis not present

## 2017-12-26 DIAGNOSIS — R945 Abnormal results of liver function studies: Secondary | ICD-10-CM | POA: Diagnosis not present

## 2017-12-26 DIAGNOSIS — K3184 Gastroparesis: Secondary | ICD-10-CM | POA: Diagnosis not present

## 2017-12-26 DIAGNOSIS — G629 Polyneuropathy, unspecified: Secondary | ICD-10-CM | POA: Diagnosis not present

## 2017-12-26 DIAGNOSIS — R7989 Other specified abnormal findings of blood chemistry: Secondary | ICD-10-CM

## 2017-12-26 LAB — BASIC METABOLIC PANEL
BUN/Creatinine Ratio: 24 (ref 12–28)
BUN: 12 mg/dL (ref 8–27)
CO2: 26 mmol/L (ref 20–29)
Calcium: 8.7 mg/dL (ref 8.7–10.3)
Chloride: 90 mmol/L — ABNORMAL LOW (ref 96–106)
Creatinine, Ser: 0.5 mg/dL — ABNORMAL LOW (ref 0.57–1.00)
GFR calc Af Amer: 108 mL/min/{1.73_m2} (ref >59)
GFR calc non Af Amer: 94 mL/min/{1.73_m2} (ref >59)
Glucose: 208 mg/dL — ABNORMAL HIGH (ref 65–99)
Potassium: 3.7 mmol/L (ref 3.5–5.2)
Sodium: 133 mmol/L — ABNORMAL LOW (ref 134–144)

## 2017-12-26 LAB — CBC
Hematocrit: 28.7 % — ABNORMAL LOW (ref 34.0–46.6)
Hemoglobin: 8.6 g/dL — ABNORMAL LOW (ref 11.1–15.9)
MCH: 23.7 pg — ABNORMAL LOW (ref 26.6–33.0)
MCHC: 30 g/dL — ABNORMAL LOW (ref 31.5–35.7)
MCV: 79 fL (ref 79–97)
Platelets: 613 10*3/uL — ABNORMAL HIGH (ref 150–379)
RBC: 3.63 x10E6/uL — ABNORMAL LOW (ref 3.77–5.28)
RDW: 15.3 % (ref 12.3–15.4)
WBC: 11.4 10*3/uL — ABNORMAL HIGH (ref 3.4–10.8)

## 2017-12-26 MED ORDER — LOSARTAN POTASSIUM 25 MG PO TABS: 25.0000 mg | ORAL_TABLET | Freq: Every day | ORAL | 0 refills | Status: DC

## 2017-12-26 MED ORDER — INSULIN REGULAR HUMAN 100 UNIT/ML IJ SOLN: INTRAMUSCULAR | 11 refills | Status: DC

## 2017-12-26 MED ORDER — HYDROCHLOROTHIAZIDE 25 MG PO TABS: 25.0000 mg | ORAL_TABLET | Freq: Every day | ORAL | 2 refills | Status: DC

## 2017-12-26 MED ORDER — "INSULIN SYRINGE 31G X 5/16"" 0.5 ML MISC": 2 refills | Status: DC

## 2017-12-26 MED ORDER — INSULIN GLARGINE 100 UNIT/ML SOLOSTAR PEN: 24.0000 [IU] | PEN_INJECTOR | Freq: Every day | SUBCUTANEOUS | 2 refills | Status: DC

## 2017-12-26 NOTE — Progress Notes (Signed)
1/3/201910:22 AM  Judith [name] July 25, 1941, 77 y.o. female [id number]  Chief Complaint  Patient presents with  . Leg Pain    AND ANKLE PAIN IN BOTH LEGS, ASLO HAVING ALL OVER BODY ITCHING AND HIGH BLOOD SUGARS. PT IS ALSO SAYING THAT SHE CANNOT STOP EATING    HPI:   Patient is a 77 y.o. female with past medical history significant for DM2, insulin dependent, gastroparesis, s/p gastrectomy, who presents today for follow-up.  1. Had EUS done, unable to see GB/CBD due  Complex post surgical anatomy, referred to tertiary center, has upcoming appt at [place]  2. Having lower extremity edema, started while in hospital, bilateral, feels ankle are tight and painful. Requesting a diuretic. No warmth or redness, no SOB.  3. DM, cbgs have been rising of recent. She has increased lantus to 24units, hungry all the time, urinating about every 1-2 hours. brings in cbg log, 7 day avg 258, checks TID AC. She is eating very large meals and snacks. She denies any nausea, vomiting or abd pain.   4. C/o of very dry itchy skin, bathes 2 x week, uses lubriderm, denies any new soaps, detergents, denies any rashes  Depression screen [facility] 2/9 11/28/2017 10/29/2017 04/05/2016  Decreased Interest 0 0 0  Down, Depressed, Hopeless 0 0 0  PHQ - 2 Score 0 0 0    Allergies  Allergen Reactions  . Asa [Aspirin]   . Sulfa Antibiotics     Prior to Admission medications   Medication Sig Start Date End Date Taking? Authorizing Provider  Biotin 5000 MCG TABS Take 1 tablet by mouth daily.   Yes [provider]  blood glucose meter kit and supplies KIT Dispense based on patient and insurance preference. Check capillary glucose three times a day, before meals.  Dx E11.65, Z79.4 10/29/17  Yes Rutherford Guys, MD  carvedilol (COREG) 12.5 MG tablet Take 1 tablet (12.5 mg total) 2 (two) times daily with a meal by mouth. 10/30/17  Yes Rutherford Guys, MD  cyclobenzaprine (FLEXERIL) 5 MG tablet Take 1 tablet (5  mg total) by mouth 3 (three) times daily as needed for muscle spasms. 11/28/17  Yes Rutherford Guys, MD  DULoxetine (CYMBALTA) 60 MG capsule Take 1 capsule (60 mg total) daily by mouth. 10/30/17  Yes Rutherford Guys, MD  gabapentin (NEURONTIN) 100 MG capsule Take 1 capsule in AM and afternoon and take 3 capsules at bedtime 11/28/17  Yes Rutherford Guys, MD  glucose blood (ONE TOUCH ULTRA TEST) test strip USE TO CHECK GLUCOSE LEVELS 3 TIMES A DAY, DX E11.9, Z79.4 11/30/17  Yes Rutherford Guys, MD  ibuprofen (ADVIL,MOTRIN) 200 MG tablet Take 400 mg by mouth every 8 (eight) hours as needed for mild pain or moderate pain.   Yes [provider]  Insulin Glargine (LANTUS SOLOSTAR) 100 UNIT/ML Solostar Pen Inject 10 Units daily at 10 pm into the skin. 10/31/17  Yes Rutherford Guys, MD  insulin lispro (HUMALOG KWIKPEN) 100 UNIT/ML KiwkPen 3 units plus sliding scale of 2 units per each 50 above glucose reading of 200, average TDD 15 units., 10/31/17  Yes Rutherford Guys, MD  Insulin Pen Needle (B-D ULTRAFINE III SHORT PEN) 31G X 8 MM MISC To use as directed with insulin, four times a day. Dx E11.9, Z79.4 11/08/17  Yes Rutherford Guys, MD  losartan (COZAAR) 25 MG tablet Take 1 tablet (25 mg total) daily by mouth. 10/30/17  Yes Grant Fontana M,  MD  Multiple Vitamin (MULTIVITAMIN) tablet Take 1 tablet daily by mouth.   Yes [provider]  pantoprazole (PROTONIX) 40 MG tablet Take 1 tablet (40 mg total) daily by mouth. 10/30/17  Yes Rutherford Guys, MD  ranitidine (ZANTAC) 150 MG capsule Take 1 capsule (150 mg total) at bedtime by mouth. 10/30/17  Yes Rutherford Guys, MD  sucralfate (CARAFATE) 1 g tablet Take 1 tablet (1 g total) 4 (four) times daily by mouth. 10/30/17  Yes Rutherford Guys, MD    Past Medical History:  Diagnosis Date  . Anemia   . Arthritis   . Biliary gastritis   . Depression   . Diabetes mellitus type 2 with complications ([place])   . Duodenal ulcer disease   . Encounter  for long-term (current) use of insulin ([place])   . Gastric ulcer   . Gastroparesis    severe  . GERD (gastroesophageal reflux disease)   . Gout   . Hypertension   . Hypokalemia, gastrointestinal losses    secondary to severe gastroparesis  . Peripheral neuropathy   . Presence of cardiac pacemaker   . S/P partial gastrectomy    due to severe gastric and gudoenal ulcers  . Seasonal allergies   . UTI (urinary tract infection)     Past Surgical History:  Procedure Laterality Date  . CORONARY ARTERY BYPASS GRAFT    . EUS N/A 12/12/2017   Procedure: UPPER ENDOSCOPIC ULTRASOUND (EUS) RADIAL;  Surgeon: [name], MD;  Location: [facility];  Service: Endoscopy;  Laterality: N/A;  . JOINT REPLACEMENT    . pace maker    . PARTIAL GASTRECTOMY      Social History   Tobacco Use  . Smoking status: Never Smoker  . Smokeless tobacco: Never Used  Substance Use Topics  . Alcohol use: No    Alcohol/week: 0.0 oz    Family History  Problem Relation Age of Onset  . Diabetes Mother   . Diabetes Father   . Heart disease Father   . Diabetes Sister   . Heart disease Son     Review of Systems  Constitutional: Negative for chills and fever.  Respiratory: Negative for cough and shortness of breath.   Cardiovascular: Negative for chest pain, palpitations and leg swelling.  Gastrointestinal: Negative for abdominal pain, nausea and vomiting.  Genitourinary: Positive for frequency. Negative for dysuria and hematuria.  Skin: Positive for itching. Negative for rash.  Endo/Heme/Allergies: Positive for polydipsia.     OBJECTIVE:  Blood pressure (!) 160/81, pulse 83, temperature 98.5 F (36.9 C), temperature source Oral, height 5' 4.17" (1.63 m), weight 111 lb (50.3 kg), SpO2 98 %.  Wt Readings from Last 3 Encounters:  12/26/17 111 lb (50.3 kg)  12/12/17 113 lb (51.3 kg)  11/28/17 112 lb (50.8 kg)    Physical Exam  Constitutional: She is oriented to person, place, and time and  well-developed, well-nourished, and in no distress.  HENT:  Head: Normocephalic and atraumatic.  Mouth/Throat: Oropharynx is clear and moist. No oropharyngeal exudate.  Eyes: EOM are normal. Pupils are equal, round, and reactive to light. No scleral icterus.  Neck: Neck supple.  Cardiovascular: Normal rate, regular rhythm and normal heart sounds. Exam reveals no gallop and no friction rub.  No murmur heard. Pulmonary/Chest: Effort normal and breath sounds normal. She has no wheezes. She has no rales.  Musculoskeletal: She exhibits edema (pitting, +2, upto mid calf).  Neurological: She is alert and oriented to person, place, and  time. Gait normal.  Skin: Skin is warm and dry.    ASSESSMENT and PLAN  1. Type 2 diabetes mellitus with hyperglycemia, with long-term current use of insulin ([place]) Continue with higher dose lantus, changing from humalog to regular due to more favorable pharmokinetics in setting of gastroparesis. Discussed insulin titration. Discussed ssx/mgt hypoglycemia. Referring to endo and nutrition. Stressed importance of small meals due to severe gastroparesis resulting in very recent hospitalization and loss of independence.  - Ambulatory referral to Endocrinology - Amb ref to Medical Nutrition Therapy-MNT - Basic metabolic panel - CBC  2. S/P partial gastrectomy - Ambulatory referral to Endocrinology - Amb ref to Medical Nutrition Therapy-MNT  3. Gastroparesis - Ambulatory referral to Endocrinology - Amb ref to Medical Nutrition Therapy-MNT  4. Peripheral polyneuropathy  5. Essential hypertension Adding hctz. Recheck at next visit - Basic metabolic panel - CBC  6. Elevated liver function tests Managed by GI, pending repeat EUS, thought to be 2/2 gallstones  7. Dry skin Routine dry skin care instructions reviewed.  Other orders - Insulin Glargine (LANTUS SOLOSTAR) 100 UNIT/ML Solostar Pen; Inject 24 Units into the skin daily at 10 pm. - insulin regular  (NOVOLIN R,HUMULIN R) 100 units/mL injection; 3 units before each meal, add 2 units for each 50 above glucose of 150, TDD 20. Dx E11.65, z79.84 - hydrochlorothiazide (HYDRODIURIL) 25 MG tablet; Take 1 tablet (25 mg total) by mouth daily. - losartan (COZAAR) 25 MG tablet; Take 1 tablet (25 mg total) by mouth daily. - Insulin Syringe-Needle U-100 (INSULIN SYRINGE .5CC/31GX5/16") 31G X 5/16" 0.5 ML MISC; Use one new syringe with each dose of regular insulin, TID AC, Dx E11.65, z79.84  Return in about 4 weeks (around 01/23/2018).    Rutherford Guys, MD [facility] at [facility] [place], [address] [address] Ph.  [phone] Fax [phone]

## 2017-12-26 NOTE — Patient Instructions (Addendum)
IF you received an x-ray today, you will receive an invoice from [facility]. Please contact [facility] at [phone] with questions or concerns regarding your invoice.   IF you received labwork today, you will receive an invoice from [place]. Please contact [facility] at [phone] with questions or concerns regarding your invoice.   Our billing staff will not be able to assist you with questions regarding bills from these companies.  You will be contacted with the lab results as soon as they are available. The fastest way to get your results is to activate your My Chart account. Instructions are located on the last page of this paperwork. If you have not heard from [place] regarding the results in 2 weeks, please contact this office.        IF you received an x-ray today, you will receive an invoice from [facility]. Please contact [facility] at [phone] with questions or concerns regarding your invoice.   IF you received labwork today, you will receive an invoice from [facility]. Please contact [facility] at [phone] with questions or concerns regarding your invoice.   Our billing staff will not be able to assist you with questions regarding bills from these companies.  You will be contacted with the lab results as soon as they are available. The fastest way to get your results is to activate your My Chart account. Instructions are located on the last page of this paperwork. If you have not heard from [place] regarding the results in 2 weeks, please contact this office.     Hypoglycemia Hypoglycemia is when the sugar (glucose) level in the blood is too low. Symptoms of low blood sugar may include:  Feeling: ? Hungry. ? Worried or nervous (anxious). ? Sweaty and clammy. ? Confused. ? Dizzy. ? Sleepy. ? Sick to your stomach (nauseous).  Having: ? A fast heartbeat. ? A headache. ? A change in your vision. ? Jerky movements that you  cannot control (seizure). ? Nightmares. ? Tingling or no feeling (numbness) around the mouth, lips, or tongue.  Having trouble with: ? Talking. ? Paying attention (concentrating). ? Moving (coordination). ? Sleeping.  Shaking.  Passing out (fainting).  Getting upset easily (irritability).  Low blood sugar can happen to people who have diabetes and people who do not have diabetes. Low blood sugar can happen quickly, and it can be an emergency. Treating Low Blood Sugar Low blood sugar is often treated by eating or drinking something sugary right away. If you can think clearly and swallow safely, follow the 15:15 rule:  Take 15 grams of a fast-acting carb (carbohydrate). Some fast-acting carbs are: ? 1 tube of glucose gel. ? 3 sugar tablets (glucose pills). ? 6-8 pieces of hard candy. ? 4 oz (120 mL) of fruit juice. ? 4 oz (120 mL) of regular (not diet) soda.  Check your blood sugar 15 minutes after you take the carb.  If your blood sugar is still at or below 70 mg/dL (3.9 mmol/L), take 15 grams of a carb again.  If your blood sugar does not go above 70 mg/dL (3.9 mmol/L) after 3 tries, get help right away.  After your blood sugar goes back to normal, eat a meal or a snack within 1 hour.  Treating Very Low Blood Sugar If your blood sugar is at or below 54 mg/dL (3 mmol/L), you have very low blood sugar (severe hypoglycemia). This is an emergency. Do not wait to see if the symptoms will go  away. Get medical help right away. Call your local emergency services (911 in the [place] Do not drive yourself to the hospital. If you have very low blood sugar and you cannot eat or drink, you may need a glucagon shot (injection). A family member or friend should learn how to check your blood sugar and how to give you a glucagon shot. Ask your doctor if you need to have a glucagon shot kit at home. Follow these instructions at home: General instructions  Avoid any diets that cause you to not  eat enough food. Talk with your doctor before you start any new diet.  Take over-the-counter and prescription medicines only as told by your doctor.  Limit alcohol to no more than 1 drink per day for nonpregnant women and 2 drinks per day for men. One drink equals 12 oz of beer, 5 oz of wine, or 1 oz of hard liquor.  Keep all follow-up visits as told by your doctor. This is important. If You Have Diabetes:   Make sure you know the symptoms of low blood sugar.  Always keep a source of sugar with you, such as: ? Sugar. ? Sugar tablets. ? Glucose gel. ? Fruit juice. ? Regular soda (not diet soda). ? Milk. ? Hard candy. ? Honey.  Take your medicines as told.  Follow your exercise and meal plan. ? Eat on time. Do not skip meals. ? Follow your sick day plan when you cannot eat or drink normally. Make this plan ahead of time with your doctor.  Check your blood sugar as often as told by your doctor. Always check before and after exercise.  Share your diabetes care plan with: ? Your work or school. ? People you live with.  Check your pee (urine) for ketones: ? When you are sick. ? As told by your doctor.  Carry a card or wear jewelry that says you have diabetes. If You Have Low Blood Sugar From Other Causes:   Check your blood sugar as often as told by your doctor.  Follow instructions from your doctor about what you cannot eat or drink. Contact a doctor if:  You have trouble keeping your blood sugar in your target range.  You have low blood sugar often. Get help right away if:  You still have symptoms after you eat or drink something sugary.  Your blood sugar is at or below 54 mg/dL (3 mmol/L).  You have jerky movements that you cannot control.  You pass out. These symptoms may be an emergency. Do not wait to see if the symptoms will go away. Get medical help right away. Call your local emergency services (911 in the [place] Do not drive yourself to the  hospital. This information is not intended to replace advice given to you by your health care provider. Make sure you discuss any questions you have with your health care provider. Document Released: 03/06/2010 Document Revised: 05/17/2016 Document Reviewed: 01/13/2016 Elsevier Interactive Patient Education  [employer].

## 2017-12-28 ENCOUNTER — Other Ambulatory Visit: Payer: Self-pay | Admitting: Family Medicine

## 2017-12-28 ENCOUNTER — Ambulatory Visit: Payer: Medicare Other | Admitting: Family Medicine

## 2017-12-28 DIAGNOSIS — K760 Fatty (change of) liver, not elsewhere classified: Secondary | ICD-10-CM

## 2017-12-28 DIAGNOSIS — D649 Anemia, unspecified: Secondary | ICD-10-CM | POA: Diagnosis not present

## 2017-12-28 DIAGNOSIS — Z1211 Encounter for screening for malignant neoplasm of colon: Secondary | ICD-10-CM

## 2017-12-28 DIAGNOSIS — R945 Abnormal results of liver function studies: Secondary | ICD-10-CM

## 2017-12-28 MED ORDER — INSULIN REGULAR HUMAN 100 UNIT/ML IJ SOLN: INTRAMUSCULAR | 11 refills | Status: DC

## 2017-12-30 LAB — PATHOLOGIST SMEAR REVIEW
Basophils Absolute: 0.1 10*3/uL (ref 0.0–0.2)
Basos: 1 %
EOS (ABSOLUTE): 0.7 10*3/uL — ABNORMAL HIGH (ref 0.0–0.4)
Eos: 6 %
Hematocrit: 25.9 % — ABNORMAL LOW (ref 34.0–46.6)
Hemoglobin: 7.8 g/dL — ABNORMAL LOW (ref 11.1–15.9)
Immature Grans (Abs): 0 10*3/uL (ref 0.0–0.1)
Immature Granulocytes: 0 %
Lymphocytes Absolute: 1.7 10*3/uL (ref 0.7–3.1)
Lymphs: 14 %
MCH: 23.1 pg — ABNORMAL LOW (ref 26.6–33.0)
MCHC: 30.1 g/dL — ABNORMAL LOW (ref 31.5–35.7)
MCV: 77 fL — ABNORMAL LOW (ref 79–97)
Monocytes Absolute: 1.1 10*3/uL — ABNORMAL HIGH (ref 0.1–0.9)
Monocytes: 9 %
Neutrophils Absolute: 8.1 10*3/uL — ABNORMAL HIGH (ref 1.4–7.0)
Neutrophils: 70 %
Platelets: 652 10*3/uL — ABNORMAL HIGH (ref 150–379)
RBC: 3.38 x10E6/uL — ABNORMAL LOW (ref 3.77–5.28)
RDW: 15.1 % (ref 12.3–15.4)
WBC: 11.6 10*3/uL — ABNORMAL HIGH (ref 3.4–10.8)

## 2017-12-30 LAB — RETICULOCYTES: Retic Ct Pct: 2.1 % (ref 0.6–2.6)

## 2017-12-30 LAB — IRON,TIBC AND FERRITIN PANEL
Ferritin: 146 ng/mL (ref 15–150)
Iron Saturation: 5 % — CL (ref 15–55)
Iron: 19 ug/dL — ABNORMAL LOW (ref 27–139)
Total Iron Binding Capacity: 405 ug/dL (ref 250–450)
UIBC: 386 ug/dL — ABNORMAL HIGH (ref 118–369)

## 2017-12-30 LAB — SEDIMENTATION RATE: Sed Rate: 81 mm/hr — ABNORMAL HIGH (ref 0–40)

## 2017-12-30 LAB — C-REACTIVE PROTEIN: CRP: 22.4 mg/L — ABNORMAL HIGH (ref 0.0–4.9)

## 2017-12-31 ENCOUNTER — Encounter: Payer: Self-pay | Admitting: Family Medicine

## 2017-12-31 ENCOUNTER — Ambulatory Visit: Payer: Medicare Other | Admitting: Family Medicine

## 2018-01-01 ENCOUNTER — Other Ambulatory Visit: Payer: Self-pay | Admitting: Family Medicine

## 2018-01-01 ENCOUNTER — Telehealth: Payer: Self-pay | Admitting: Internal Medicine

## 2018-01-01 MED ORDER — FERROUS SULFATE 325 (65 FE) MG PO TABS: 325.0000 mg | ORAL_TABLET | Freq: Every day | ORAL | 1 refills | Status: DC

## 2018-01-01 NOTE — Telephone Encounter (Signed)
More records were requested and faxed on 12/25/17. Records confirmed received. Phone number [phone] given to the daughter.

## 2018-01-03 ENCOUNTER — Encounter: Payer: Self-pay | Admitting: Family Medicine

## 2018-01-07 ENCOUNTER — Telehealth: Payer: Self-pay | Admitting: Internal Medicine

## 2018-01-07 DIAGNOSIS — Z1212 Encounter for screening for malignant neoplasm of rectum: Secondary | ICD-10-CM | POA: Diagnosis not present

## 2018-01-07 DIAGNOSIS — Z1211 Encounter for screening for malignant neoplasm of colon: Secondary | ICD-10-CM | POA: Diagnosis not present

## 2018-01-07 NOTE — Telephone Encounter (Signed)
Noted that [facility] recommends surgery rather than GI biliary procedure.  Dan,  Recall this patient that you helped with in Dec 2018.   Best summarized by lab result note dated 12/19/2017 (attached to the Hep A lab result) [facility] did not feel she was amenable to ERCP and recommended she see surgery. Do you think reasonable to refer to [facility] for their opinion, or should be proceed with hepatobiliary surgical opinion at [facility]? Thanks again [facility]

## 2018-01-07 NOTE — Telephone Encounter (Signed)
Route to [place].

## 2018-01-07 NOTE — Telephone Encounter (Signed)
I don't think she needs another GI opinion. She should proceed with surgical referral at [facility].    thanks

## 2018-01-07 NOTE — Telephone Encounter (Signed)
[facility] states pt is not candidate for EUS or ERCP, state she should have a surgical consult. Do you want her to be referred to surgeon at [facility]? Please advise.

## 2018-01-07 NOTE — Telephone Encounter (Signed)
Pt is scheduled to see Surgeon at [facility] 01/17/18.

## 2018-01-09 ENCOUNTER — Encounter: Payer: Medicare Other | Attending: Family Medicine | Admitting: Dietician

## 2018-01-09 ENCOUNTER — Encounter: Payer: Self-pay | Admitting: Dietician

## 2018-01-09 DIAGNOSIS — E1165 Type 2 diabetes mellitus with hyperglycemia: Secondary | ICD-10-CM | POA: Diagnosis not present

## 2018-01-09 DIAGNOSIS — E118 Type 2 diabetes mellitus with unspecified complications: Secondary | ICD-10-CM

## 2018-01-09 DIAGNOSIS — Z903 Acquired absence of stomach [part of]: Secondary | ICD-10-CM | POA: Insufficient documentation

## 2018-01-09 DIAGNOSIS — Z794 Long term (current) use of insulin: Secondary | ICD-10-CM | POA: Diagnosis not present

## 2018-01-09 DIAGNOSIS — I10 Essential (primary) hypertension: Secondary | ICD-10-CM

## 2018-01-09 DIAGNOSIS — K3184 Gastroparesis: Secondary | ICD-10-CM | POA: Diagnosis not present

## 2018-01-09 DIAGNOSIS — Z713 Dietary counseling and surveillance: Secondary | ICD-10-CM | POA: Insufficient documentation

## 2018-01-09 DIAGNOSIS — R945 Abnormal results of liver function studies: Secondary | ICD-10-CM

## 2018-01-09 DIAGNOSIS — R7989 Other specified abnormal findings of blood chemistry: Secondary | ICD-10-CM

## 2018-01-09 NOTE — Patient Instructions (Signed)
Use the portion plate aiming for < 1 cup of starch/carbohydrate, 4 oz. Of lean meat (size of a deck of cards) and 1-2 cups of cooked vegetables. Aim for 2-3 carb choices (30-45 grams of Total Carbohydrate) per meal. Aim for 1-2 carb choices (15-30 grams of Total Carbohydrate) per snack. Try to avoid going longer than 4-5 hours without eating during the day. Add in an afternoon snack around 4 pm like Glucerna. Consider adding a lean protein to lunch like chicken or tuna salad, grilled well-cooked meat, or hard boiled egg. Limit fat to 5-10 grams per meal. Have water instead of cranberry juice with your evening meal.  Goal is 32 oz. Per day.  You can flavor your water with sugar-free koolaid or lemons and artificial sweetener.

## 2018-01-09 NOTE — Progress Notes (Signed)
Diabetes Self-Management Education  Visit Type: First/Initial  Appt. Start Time: 1330 Appt. End Time: 1500  01/09/2018  [name], identified by name and date of birth, is a 77 y.o. female with a diagnosis of Diabetes: Type 2 and gastroparesis.  Patient is here today with her daughter who she is currently living with.  They bring in a food log today from the past week.  They request nutrition recommendations for portion control, carbohydrate intake, and appetite management.  We discussed the need to decrease fat intake for gastroparesis and liver health as well as balanced meals using the portion plate and basic carb counting, and appetite management by staying properly hydrated, practicing mindful eating, and incorporating smaller, more frequent meals (especially important for gastroparesis).  Currently patient is having 3 meals a day with occasional afternoon snack.  She recently has made some adjustments to her portion sizes, is limiting her sodium intake, and has cut down on her sugar intake.  We reviewed foods recommended and not recommended for gastroparesis.  Answered her nutrition questions and reviewed Nutrition Label reading.   ASSESSMENT  Diabetes Self-Management Education - 01/09/18 1408      Dietary Intake   Breakfast (9 am)  hot decaf green tea with artificial sweetener and skim milk, 2 boiled eggs OR 1 cup grits with 1 tsp smart balance margarine, 1/4 cup cranberry juice    Snack (morning)  usually none   Lunch (12 noon) salad with tomatoes, 6 tbsp of [place] dressing and 1/2 cup cheese, sips of cranberry juice    Snack (4 pm) Sometimes will have hot skim milk with cocoa and artificial sweetener OR Glucerna         Dinner (6 pm) vegetable soup, chili, parmesan chicken with spinach or green beans, occasionally salmon or pork chop with potatoes       Individualized Plan for Diabetes Self-Management Training:   Learning Objective:  Patient will have a greater understanding  of diabetes self-management. Patient education plan is to attend individual and/or group sessions per assessed needs and concerns.   Plan:   Patient Instructions  Use the portion plate aiming for < 1 cup of starch/carbohydrate, 4 oz. Of lean meat (size of a deck of cards) and 1-2 cups of cooked vegetables. Aim for 2-3 carb choices (30-45 grams of Total Carbohydrate) per meal. Aim for 1-2 carb choices (15-30 grams of Total Carbohydrate) per snack. Try to avoid going longer than 4-5 hours without eating during the day. Add in an afternoon snack around 4 pm like Glucerna. Consider adding a lean protein to lunch like chicken or tuna salad, grilled meat, or hard boiled egg. Limit fat to 5-10 grams per meal.  Choose lean meats, avoid fried foods, and choose low-fat condiments and dressings. Have water instead of cranberry juice with your evening meal.  Goal is 32 oz. of water per day.  You can flavor your water with sugar-free koolaid or lemons and artificial sweetener.   Education material provided: Meal plan card, My Plate, Gastroparesis Nutrition Therapy  If problems or questions, patient to contact team via:  Phone and Email  Future DSME appointment: prn

## 2018-01-12 ENCOUNTER — Encounter: Payer: Self-pay | Admitting: Family Medicine

## 2018-01-13 DIAGNOSIS — E0841 Diabetes mellitus due to underlying condition with diabetic mononeuropathy: Secondary | ICD-10-CM | POA: Diagnosis not present

## 2018-01-13 DIAGNOSIS — K3184 Gastroparesis: Secondary | ICD-10-CM | POA: Diagnosis not present

## 2018-01-13 DIAGNOSIS — R748 Abnormal levels of other serum enzymes: Secondary | ICD-10-CM | POA: Diagnosis not present

## 2018-01-13 DIAGNOSIS — E0865 Diabetes mellitus due to underlying condition with hyperglycemia: Secondary | ICD-10-CM | POA: Diagnosis not present

## 2018-01-17 DIAGNOSIS — Z980 Intestinal bypass and anastomosis status: Secondary | ICD-10-CM | POA: Diagnosis not present

## 2018-01-17 DIAGNOSIS — D62 Acute posthemorrhagic anemia: Secondary | ICD-10-CM | POA: Insufficient documentation

## 2018-01-17 DIAGNOSIS — K8020 Calculus of gallbladder without cholecystitis without obstruction: Secondary | ICD-10-CM | POA: Diagnosis not present

## 2018-01-17 DIAGNOSIS — Z794 Long term (current) use of insulin: Secondary | ICD-10-CM | POA: Diagnosis not present

## 2018-01-17 DIAGNOSIS — R945 Abnormal results of liver function studies: Secondary | ICD-10-CM | POA: Diagnosis not present

## 2018-01-17 DIAGNOSIS — R634 Abnormal weight loss: Secondary | ICD-10-CM | POA: Diagnosis not present

## 2018-01-17 DIAGNOSIS — K3184 Gastroparesis: Secondary | ICD-10-CM | POA: Diagnosis not present

## 2018-01-17 DIAGNOSIS — E1143 Type 2 diabetes mellitus with diabetic autonomic (poly)neuropathy: Secondary | ICD-10-CM | POA: Diagnosis not present

## 2018-01-17 HISTORY — DX: Calculus of gallbladder without cholecystitis without obstruction: K80.20

## 2018-01-20 LAB — COLOGUARD: Cologuard: POSITIVE

## 2018-01-21 ENCOUNTER — Telehealth: Payer: Self-pay | Admitting: Family Medicine

## 2018-01-21 NOTE — Telephone Encounter (Signed)
[employer] called in to confirm if abnormal colo guard was received by provider?   Please advise.   CB: [phone]  [phone] Ref.    Results were positive. - Results were faxed over to provider this morning.

## 2018-01-21 NOTE — Telephone Encounter (Signed)
received

## 2018-01-23 ENCOUNTER — Ambulatory Visit: Payer: Medicare Other

## 2018-01-23 ENCOUNTER — Ambulatory Visit: Payer: Medicare Other | Admitting: Family Medicine

## 2018-01-23 ENCOUNTER — Ambulatory Visit: Payer: Medicare Other | Admitting: Physician Assistant

## 2018-01-24 ENCOUNTER — Encounter: Payer: Self-pay | Admitting: Family Medicine

## 2018-01-24 ENCOUNTER — Other Ambulatory Visit: Payer: Self-pay | Admitting: Family Medicine

## 2018-01-24 ENCOUNTER — Ambulatory Visit (INDEPENDENT_AMBULATORY_CARE_PROVIDER_SITE_OTHER): Payer: Medicare Other

## 2018-01-24 ENCOUNTER — Ambulatory Visit: Payer: Medicare Other | Admitting: Family Medicine

## 2018-01-24 ENCOUNTER — Other Ambulatory Visit: Payer: Self-pay

## 2018-01-24 VITALS — BP 104/68 | HR 78 | Temp 97.7°F | Ht 64.0 in | Wt 106.0 lb

## 2018-01-24 VITALS — BP 104/68 | HR 78 | Temp 97.7°F | Ht 64.0 in | Wt 106.1 lb

## 2018-01-24 DIAGNOSIS — K269 Duodenal ulcer, unspecified as acute or chronic, without hemorrhage or perforation: Secondary | ICD-10-CM

## 2018-01-24 DIAGNOSIS — Z0000 Encounter for general adult medical examination without abnormal findings: Secondary | ICD-10-CM | POA: Diagnosis not present

## 2018-01-24 DIAGNOSIS — M130 Polyarthritis, unspecified: Secondary | ICD-10-CM | POA: Diagnosis not present

## 2018-01-24 DIAGNOSIS — R195 Other fecal abnormalities: Secondary | ICD-10-CM | POA: Diagnosis not present

## 2018-01-24 DIAGNOSIS — D649 Anemia, unspecified: Secondary | ICD-10-CM

## 2018-01-24 DIAGNOSIS — R945 Abnormal results of liver function studies: Secondary | ICD-10-CM

## 2018-01-24 DIAGNOSIS — M19042 Primary osteoarthritis, left hand: Secondary | ICD-10-CM | POA: Diagnosis not present

## 2018-01-24 DIAGNOSIS — Z794 Long term (current) use of insulin: Secondary | ICD-10-CM | POA: Diagnosis not present

## 2018-01-24 DIAGNOSIS — E1165 Type 2 diabetes mellitus with hyperglycemia: Secondary | ICD-10-CM

## 2018-01-24 DIAGNOSIS — I10 Essential (primary) hypertension: Secondary | ICD-10-CM

## 2018-01-24 DIAGNOSIS — M25572 Pain in left ankle and joints of left foot: Secondary | ICD-10-CM | POA: Diagnosis not present

## 2018-01-24 DIAGNOSIS — M7989 Other specified soft tissue disorders: Secondary | ICD-10-CM | POA: Diagnosis not present

## 2018-01-24 DIAGNOSIS — M19031 Primary osteoarthritis, right wrist: Secondary | ICD-10-CM | POA: Diagnosis not present

## 2018-01-24 LAB — POCT GLYCOSYLATED HEMOGLOBIN (HGB A1C): Hemoglobin A1C: 6.1

## 2018-01-24 LAB — GLUCOSE, POCT (MANUAL RESULT ENTRY): POC Glucose: 206 mg/dl — AB (ref 70–99)

## 2018-01-24 MED ORDER — DICLOFENAC SODIUM 1 % TD GEL: 2.0000 g | Freq: Four times a day (QID) | TRANSDERMAL | 2 refills | Status: DC

## 2018-01-24 MED ORDER — HYDROCHLOROTHIAZIDE 25 MG PO TABS: 12.5000 mg | ORAL_TABLET | Freq: Every day | ORAL | 2 refills | Status: DC

## 2018-01-24 MED ORDER — INSULIN REGULAR HUMAN 100 UNIT/ML IJ SOLN: INTRAMUSCULAR | 11 refills | Status: DC

## 2018-01-24 MED ORDER — INSULIN GLARGINE 100 UNIT/ML SOLOSTAR PEN: 26.0000 [IU] | PEN_INJECTOR | Freq: Every day | SUBCUTANEOUS | 2 refills | Status: DC

## 2018-01-24 NOTE — Progress Notes (Signed)
Subjective:   Judith [name] is a 77 y.o. female who presents for an Initial Medicare Annual Wellness Visit.  Review of Systems    N/A  Cardiac Risk Factors include: advanced age (>48mn, >>52women);diabetes mellitus;hypertension;sedentary lifestyle     Objective:    Today's Vitals   01/24/18 1318 01/24/18 1329  BP: 104/68   Pulse: 78   Temp: 97.7 F (36.5 C)   TempSrc: Oral   SpO2: 100%   Weight: 106 lb 2 oz (48.1 kg)   Height: 5' 4" (1.626 m)   PainSc:  7    Body mass index is 18.22 kg/m.  Advanced Directives 01/24/2018 12/12/2017  Does Patient Have a Medical Advance Directive? Yes Yes  Type of [place] will -  Copy of HEntiatin Chart? Yes -    Current Medications (verified) Outpatient Encounter Medications as of 01/24/2018  Medication Sig  . Biotin 1000 MCG tablet Take 1,000 mcg by mouth daily.  . blood glucose meter kit and supplies KIT Dispense based on patient and insurance preference. Check capillary glucose three times a day, before meals.  Dx E11.65, Z79.4  . carvedilol (COREG) 12.5 MG tablet Take 1 tablet (12.5 mg total) 2 (two) times daily with a meal by mouth.  . cyclobenzaprine (FLEXERIL) 5 MG tablet Take 1 tablet (5 mg total) by mouth 3 (three) times daily as needed for muscle spasms.  . diphenhydrAMINE (BENADRYL) 25 MG tablet Take 50 mg by mouth at bedtime.  . DULoxetine (CYMBALTA) 60 MG capsule Take 1 capsule (60 mg total) daily by mouth.  . ferrous sulfate 325 (65 FE) MG tablet Take 1 tablet (325 mg total) by mouth daily with breakfast.  . gabapentin (NEURONTIN) 100 MG capsule Take 1 capsule in AM and afternoon and take 3 capsules at bedtime  . glucose blood (ONE TOUCH ULTRA TEST) test strip USE TO CHECK GLUCOSE LEVELS 3 TIMES A DAY, DX E11.9, Z79.4  . hydrochlorothiazide (HYDRODIURIL) 25 MG tablet Take 1 tablet (25 mg total) by mouth daily.  . Insulin Glargine (LANTUS SOLOSTAR) 100 UNIT/ML  Solostar Pen Inject 24 Units into the skin daily at 10 pm.  . Insulin Pen Needle (B-D ULTRAFINE III SHORT PEN) 31G X 8 MM MISC To use as directed with insulin, four times a day. Dx E11.9, Z79.4  . insulin regular (NOVOLIN R,HUMULIN R) 100 units/mL injection 3 units before each meal, add 2 units for each 50 above glucose of 150, TDD 20. Dx E11.65, z79.84  . Insulin Syringe-Needle U-100 (INSULIN SYRINGE .5CC/31GX5/16") 31G X 5/16" 0.5 ML MISC Use one new syringe with each dose of regular insulin, TID AC, Dx E11.65, z79.84  . losartan (COZAAR) 25 MG tablet Take 1 tablet (25 mg total) by mouth daily.  . Multiple Vitamin (MULTIVITAMIN) tablet Take 1 tablet daily by mouth.  . pantoprazole (PROTONIX) 40 MG tablet Take 1 tablet (40 mg total) daily by mouth.  . ranitidine (ZANTAC) 150 MG capsule Take 1 capsule (150 mg total) at bedtime by mouth.  . [DISCONTINUED] Biotin 5000 MCG TABS Take 1 tablet by mouth daily.  . [DISCONTINUED] ibuprofen (ADVIL,MOTRIN) 200 MG tablet Take 400 mg by mouth every 8 (eight) hours as needed for mild pain or moderate pain.  . [DISCONTINUED] sucralfate (CARAFATE) 1 g tablet Take 1 tablet (1 g total) 4 (four) times daily by mouth.   No facility-administered encounter medications on file as of 01/24/2018.     Allergies (verified) ADiona Fanti[  aspirin] and Sulfa antibiotics   History: Past Medical History:  Diagnosis Date  . Anemia   . Arthritis   . Biliary gastritis   . Depression   . Diabetes mellitus type 2 with complications ([place])   . Duodenal ulcer disease   . Encounter for long-term (current) use of insulin ([place])   . Gastric ulcer   . Gastroparesis    severe  . GERD (gastroesophageal reflux disease)   . Gout   . Hypertension   . Hypokalemia, gastrointestinal losses    secondary to severe gastroparesis  . Peripheral neuropathy   . Presence of cardiac pacemaker   . S/P partial gastrectomy    due to severe gastric and gudoenal ulcers  . Seasonal allergies   . Sleep  apnea   . UTI (urinary tract infection)    Past Surgical History:  Procedure Laterality Date  . CORONARY ARTERY BYPASS GRAFT    . EUS N/A 12/12/2017   Procedure: UPPER ENDOSCOPIC ULTRASOUND (EUS) RADIAL;  Surgeon: [name], MD;  Location: [facility];  Service: Endoscopy;  Laterality: N/A;  . JOINT REPLACEMENT    . pace maker    . PARTIAL GASTRECTOMY     Family History  Problem Relation Age of Onset  . Diabetes Mother   . Diabetes Father   . Heart disease Father   . Diabetes Sister   . Heart disease Son    Social History   Socioeconomic History  . Marital status: Widowed    Spouse name: None  . Number of children: 3  . Years of education: None  . Highest education level: High school graduate  Social Needs  . Financial resource strain: Not hard at all  . Food insecurity - worry: Never true  . Food insecurity - inability: Never true  . Transportation needs - medical: No  . Transportation needs - non-medical: No  Occupational History  . None  Tobacco Use  . Smoking status: Never Smoker  . Smokeless tobacco: Never Used  Substance and Sexual Activity  . Alcohol use: No    Alcohol/week: 0.0 oz  . Drug use: No  . Sexual activity: No  Other Topics Concern  . None  Social History Narrative   Used to live independently in [place]   However had significant issues with severe gastroparesis resulting in severe hypokalemia and general weakness and fall resulting in hospitalization and SNF rehab   Now (10/2017) living with her daughter in [place] until able to stabilize and regain independence.    Tobacco Counseling Counseling given: Not Answered   Clinical Intake:  Pre-visit preparation completed: Yes  Pain : 0-10 Pain Score: 7  Pain Type: Chronic pain Pain Location: (both hands and ankles) Pain Descriptors / Indicators: Aching Pain Onset: More than a month ago Pain Frequency: Constant     Nutritional Status: BMI <19  Underweight Nutritional Risks:  None Diabetes: Yes CBG done?: No Did pt. bring in CBG monitor from home?: No  How often do you need to have someone help you when you read instructions, pamphlets, or other written materials from your doctor or pharmacy?: 1 - Never What is the last grade level you completed in school?: 12th grade   Interpreter Needed?: No  Information entered by :: Andrez Grime, LPN   Activities of Daily Living In your present state of health, do you have any difficulty performing the following activities: 01/24/2018  Hearing? N  Vision? N  Difficulty concentrating or making decisions? Y  Comment Patient  has issues with her short memory sometimes.   Walking or climbing stairs? Y  Comment patient cannot climb stairs at all.   Dressing or bathing? Y  Comment Daughter helps her bath sometimes  Doing errands, shopping? N  Preparing Food and eating ? N  Using the Toilet? N  In the past six months, have you accidently leaked urine? Y  Comment Patient has incontinence at nightime and wears depends daily.   Do you have problems with loss of bowel control? N  Managing your Medications? N  Managing your Finances? N  Housekeeping or managing your Housekeeping? N  Some recent data might be hidden     Immunizations and Health Maintenance  There is no immunization history on file for this patient. Health Maintenance Due  Topic Date Due  . FOOT EXAM  03/07/1951  . OPHTHALMOLOGY EXAM  03/07/1951    Patient Care Team: Rutherford Guys, MD as PCP - General (Family Medicine) Eldridge Abrahams, MD as Referring Physician (Surgical Oncology) Pyrtle, Lajuan Lines, MD as Consulting Physician (Gastroenterology) Madelin Rear, MD as Referring Physician (Endocrinology) Croitoru, Dani Gobble, MD as Consulting Physician (Cardiology)  Indicate any recent Medical Services you may have received from other than [facility] providers in the past year (date may be approximate).     Assessment:   This is a routine wellness  examination for Judith [name].  Hearing/Vision screen Hearing Screening Comments: Patient has never had a hearing exam.  Vision Screening Comments: Patient has not seen an eye doctor lately.   Dietary issues and exercise activities discussed: Current Exercise Habits: The patient does not participate in regular exercise at present, Exercise limited by: None identified  Goals    . Exercise 3x per week (30 min per time)     Patient states that she to start trying to exercise more on a more consistent basis.       Depression Screen PHQ 2/9 Scores 01/24/2018 11/28/2017 10/29/2017 04/05/2016 12/20/2015  PHQ - 2 Score 1 0 0 0 0  PHQ- 9 Score 10 - - - -    Fall Risk Fall Risk  01/24/2018 11/28/2017 10/29/2017  Falls in the past year? Yes Yes Yes  Number falls in past yr: 2 or more - 1  Injury with Fall? Yes - Yes  Comment injured left leg/knee - injured left knee and hit hit and whole left side of body  Risk Factor Category  High Fall Risk - -  Risk for fall due to : History of fall(s) - -  Follow up Falls prevention discussed - -    Is the patient's home free of loose throw rugs in walkways, pet beds, electrical cords, etc?   yes      Grab bars in the bathroom? yes      Handrails on the stairs?   yes      Adequate lighting?   yes  Timed Get Up and Go Performed: yes, completed within 30 seconds. Patient is using her walker.   Cognitive Function:     6CIT Screen 01/24/2018  What Year? 0 points  What month? 0 points  What time? 3 points  Count back from 20 0 points  Months in reverse 0 points  Repeat phrase 4 points  Total Score 7    Screening Tests Health Maintenance  Topic Date Due  . FOOT EXAM  03/07/1951  . OPHTHALMOLOGY EXAM  03/07/1951  . DEXA SCAN  01/24/2019 (Originally 03/07/2005)  . TETANUS/TDAP  01/24/2019 (Originally 03/08/1959)  . HEMOGLOBIN  A1C  04/28/2018  . PNA vac Low Risk Adult (2 of 2 - PPSV23) 09/23/2018  . INFLUENZA VACCINE  Completed    Qualifies for Shingles  Vaccine? Advised patient to check with her pharmacy about receiving the Shingrix vaccine   Cancer Screenings: Lung: Low Dose CT Chest recommended if Age 47-80 years, 30 pack-year currently smoking OR have quit w/in 15years. Patient does not qualify. Breast: Up to date on Mammogram? N/A, Patient is age 2   Up to date of Bone Density/Dexa? No, Patient declined at this time Colorectal: Cologuard completed 01/20/2018  Additional Screenings:  Hepatitis B/HIV/Syphillis: not indicated  Hepatitis C Screening: not indicated      Plan:   I have personally reviewed and noted the following in the patient's chart:   . Medical and social history . Use of alcohol, tobacco or illicit drugs  . Current medications and supplements . Functional ability and status . Nutritional status . Physical activity . Advanced directives . List of other physicians . Hospitalizations, surgeries, and ER visits in previous 12 months . Vitals . Screenings to include cognitive, depression, and falls . Referrals and appointments  In addition, I have reviewed and discussed with patient certain preventive protocols, quality metrics, and best practice recommendations. A written personalized care plan for preventive services as well as general preventive health recommendations were provided to patient.    1. Encounter for Medicare annual wellness exam    Andrez Grime, LPN   7/0/0174

## 2018-01-24 NOTE — Progress Notes (Signed)
2/1/20192:44 PM  Judith [name] 1941-03-18, 77 y.o. female [id number]  Chief Complaint  Patient presents with  . Follow-up    DIABETES CHK UP    HPI:   Patient is a 77 y.o. female with past medical history significant for DM2 insulin dependent, gastroparesis, s/p gastrectomy, abnormal LFTs, - unclear etiology and anemia who presents today for routine follow-up.  1. DM2 - saw endo, no changes to meds at this point, pending GI/gen surg workup for abnl LFTs and possible chole. Doing lantus 26units at bedtime, regular 5 units + ISS 2/50/150. Brings in cbg log, no lows. 14 day fasting avg 144, avg regular insulin use of 21u/day. cbg range from 96-281. Has also seen nutritionist, trying to add more protein to snacks and less carbs  2. abnl LFTs - has seen gen surg at [facility], planning emptying study, planning on chole  3. Anemia - denies any abnormal bleeding, gen surg stopped nsaids due to h/o severe gastric ulcers, feeling tired but no chest pain, sob, dizziness, or palpittaions  4. Gastroparesis - has decreased portions, had couple of days with vomiting about 2 weeks ago, went on liquid diet, currently much better - no nausea, vomiting or abd pain, pending gastric emptying study  5. Joint pain - patient reports on going swelling and pain of left ankle, now with involvement of left hand and right wrist, joints at times have been red and warm, denies any trauma or injury. Making it difficult to negotiate insulin vials/syringes. Wondering about going back onto humalog. Also wondering what she will do about pain now that she cant take NSAIDs because of h/o severe GI ulcers and cant take tylenol due to abnormal LFTs  6. Discussed with patient + cologuard, denies any changes to stools, black tarry stools, blood in stools  Depression screen [facility] 2/9 01/24/2018 01/24/2018 11/28/2017  Decreased Interest 0 1 0  Down, Depressed, Hopeless 0 0 0  PHQ - 2 Score 0 1 0  Altered sleeping - 3 -  Tired,  decreased energy - 3 -  Change in appetite - 3 -  Feeling bad or failure about yourself  - 0 -  Trouble concentrating - 0 -  Moving slowly or fidgety/restless - 0 -  Suicidal thoughts - 0 -  PHQ-9 Score - 10 -  Difficult doing work/chores - Somewhat difficult -    Allergies  Allergen Reactions  . Asa [Aspirin]   . Sulfa Antibiotics     Prior to Admission medications   Medication Sig Start Date End Date Taking? Authorizing Provider  Biotin 1000 MCG tablet Take 1,000 mcg by mouth daily.    [provider]  blood glucose meter kit and supplies KIT Dispense based on patient and insurance preference. Check capillary glucose three times a day, before meals.  Dx E11.65, Z79.4 10/29/17   Rutherford Guys, MD  carvedilol (COREG) 12.5 MG tablet Take 1 tablet (12.5 mg total) 2 (two) times daily with a meal by mouth. 10/30/17   Rutherford Guys, MD  cyclobenzaprine (FLEXERIL) 5 MG tablet Take 1 tablet (5 mg total) by mouth 3 (three) times daily as needed for muscle spasms. 11/28/17   Rutherford Guys, MD  diphenhydrAMINE (BENADRYL) 25 MG tablet Take 50 mg by mouth at bedtime.    [provider]  DULoxetine (CYMBALTA) 60 MG capsule Take 1 capsule (60 mg total) daily by mouth. 10/30/17   Rutherford Guys, MD  ferrous sulfate 325 (65 FE) MG tablet Take  1 tablet (325 mg total) by mouth daily with breakfast. 01/01/18   Rutherford Guys, MD  gabapentin (NEURONTIN) 100 MG capsule Take 1 capsule in AM and afternoon and take 3 capsules at bedtime 11/28/17   Rutherford Guys, MD  glucose blood (ONE TOUCH ULTRA TEST) test strip USE TO CHECK GLUCOSE LEVELS 3 TIMES A DAY, DX E11.9, Z79.4 11/30/17   Rutherford Guys, MD  hydrochlorothiazide (HYDRODIURIL) 25 MG tablet Take 1 tablet (25 mg total) by mouth daily. 12/26/17   Rutherford Guys, MD  Insulin Glargine (LANTUS SOLOSTAR) 100 UNIT/ML Solostar Pen Inject 24 Units into the skin daily at 10 pm. 12/26/17   Rutherford Guys, MD  Insulin Pen Needle (B-D  ULTRAFINE III SHORT PEN) 31G X 8 MM MISC To use as directed with insulin, four times a day. Dx E11.9, Z79.4 11/08/17   Rutherford Guys, MD  insulin regular (NOVOLIN R,HUMULIN R) 100 units/mL injection 3 units before each meal, add 2 units for each 50 above glucose of 150, TDD 20. Dx E11.65, z79.84 12/28/17   Rutherford Guys, MD  Insulin Syringe-Needle U-100 (INSULIN SYRINGE .5CC/31GX5/16") 31G X 5/16" 0.5 ML MISC Use one new syringe with each dose of regular insulin, TID AC, Dx E11.65, z79.84 12/26/17   Rutherford Guys, MD  losartan (COZAAR) 25 MG tablet Take 1 tablet (25 mg total) by mouth daily. 12/26/17   Rutherford Guys, MD  Multiple Vitamin (MULTIVITAMIN) tablet Take 1 tablet daily by mouth.    [provider]  pantoprazole (PROTONIX) 40 MG tablet Take 1 tablet (40 mg total) daily by mouth. 10/30/17   Rutherford Guys, MD  ranitidine (ZANTAC) 150 MG capsule Take 1 capsule (150 mg total) at bedtime by mouth. 10/30/17   Rutherford Guys, MD    Past Medical History:  Diagnosis Date  . Anemia   . Arthritis   . Biliary gastritis   . Depression   . Diabetes mellitus type 2 with complications ([place])   . Duodenal ulcer disease   . Encounter for long-term (current) use of insulin ([place])   . Gastric ulcer   . Gastroparesis    severe  . GERD (gastroesophageal reflux disease)   . Gout   . Hypertension   . Hypokalemia, gastrointestinal losses    secondary to severe gastroparesis  . Peripheral neuropathy   . Presence of cardiac pacemaker   . S/P partial gastrectomy    due to severe gastric and gudoenal ulcers  . Seasonal allergies   . Sleep apnea   . UTI (urinary tract infection)     Past Surgical History:  Procedure Laterality Date  . CORONARY ARTERY BYPASS GRAFT    . EUS N/A 12/12/2017   Procedure: UPPER ENDOSCOPIC ULTRASOUND (EUS) RADIAL;  Surgeon: [name], MD;  Location: [facility];  Service: Endoscopy;  Laterality: N/A;  . JOINT REPLACEMENT    . pace maker    .  PARTIAL GASTRECTOMY      Social History   Tobacco Use  . Smoking status: Never Smoker  . Smokeless tobacco: Never Used  Substance Use Topics  . Alcohol use: No    Alcohol/week: 0.0 oz    Family History  Problem Relation Age of Onset  . Diabetes Mother   . Diabetes Father   . Heart disease Father   . Diabetes Sister   . Heart disease Son     Review of Systems  Constitutional: Positive for malaise/fatigue. Negative for chills and  fever.  Respiratory: Negative for cough and shortness of breath.   Cardiovascular: Negative for chest pain, palpitations and leg swelling.  Gastrointestinal: Negative for abdominal pain, nausea and vomiting.     OBJECTIVE:  Blood pressure 104/68, pulse 78, temperature 97.7 F (36.5 C), temperature source Oral, height '5\' 4"'  (1.626 m), weight 106 lb (48.1 kg), SpO2 100 %.  Wt Readings from Last 3 Encounters:  01/24/18 106 lb (48.1 kg)  01/24/18 106 lb 2 oz (48.1 kg)  12/26/17 111 lb (50.3 kg)    Physical Exam  Constitutional: She is oriented to person, place, and time and well-developed, well-nourished, and in no distress.  Thin and fraile  HENT:  Head: Normocephalic and atraumatic.  Mouth/Throat: Oropharynx is clear and moist. No oropharyngeal exudate.  Eyes: EOM are normal. Pupils are equal, round, and reactive to light. No scleral icterus.  Neck: Neck supple.  Cardiovascular: Normal rate, regular rhythm and normal heart sounds. Exam reveals no gallop and no friction rub.  No murmur heard. Pulmonary/Chest: Effort normal and breath sounds normal. She has no wheezes. She has no rales.  Musculoskeletal: She exhibits no edema.  Affected joints with decreased ROM due to swelling. Wrist and hand (1st and 2nd MTP joints) also with erythema and warmth.  Neurological: She is alert and oriented to person, place, and time. Gait normal.  Skin: Skin is warm and dry.    Results for orders placed or performed in visit on 01/24/18 (from the past 24  hour(s))  POCT glucose (manual entry)     Status: Abnormal   Collection Time: 01/24/18  2:38 PM  Result Value Ref Range   POC Glucose 206 (A) 70 - 99 mg/dl  POCT glycosylated hemoglobin (Hb A1C)     Status: None   Collection Time: 01/24/18  2:42 PM  Result Value Ref Range   Hemoglobin A1C 6.1     Dg Wrist Complete Right  Result Date: 01/24/2018 CLINICAL DATA:  Multifocal joint pain.  No acute abnormality. EXAM: RIGHT WRIST - COMPLETE 3+ VIEW COMPARISON:  None. FINDINGS: Soft tissues about the wrist are swollen. No acute bony or joint abnormality is seen. No erosion is identified. Joint space narrowing appears worst at the scaphoid trapezium trapezoid joint. Small cyst or enchondroma in the capitate is noted. Bones are mildly osteopenic. Mild triangular fibrocartilage chondrocalcinosis is seen. IMPRESSION: Soft tissue swelling without acute bony or joint abnormality. STT osteoarthritis. Mild triangular fibrocartilage chondrocalcinosis. Electronically Signed   By: Inge Rise M.D.   On: 01/24/2018 15:56   Dg Ankle Complete Left  Result Date: 01/24/2018 CLINICAL DATA:  Multifocal joint pain.  No known injury. EXAM: LEFT ANKLE COMPLETE - 3+ VIEW COMPARISON:  None. FINDINGS: There is no evidence of fracture, dislocation, or joint effusion. There is no evidence of arthropathy or other focal bone abnormality. Soft tissues demonstrate some swelling about the ankle. IMPRESSION: Mild appearing soft tissue swelling about the ankle. Otherwise negative. Electronically Signed   By: Inge Rise M.D.   On: 01/24/2018 16:02   Dg Hand Complete Left  Result Date: 01/24/2018 CLINICAL DATA:  Multifocal joint pain. EXAM: LEFT HAND - COMPLETE 3+ VIEW COMPARISON:  None. FINDINGS: No acute bony or joint abnormality is seen. There is joint space narrowing and osteophytosis about scattered DIP joints and the IP joint of the thumb. Joint space narrowing with subchondral sclerosis and mild osteophytosis are also seen  at the first [facility] and scaphoid trapezium trapezoid joints. There is also joint space narrowing of  the third MCP without erosive change. No erosion is identified. No fracture or dislocation. Triangular fibrocartilage chondrocalcinosis is identified. IMPRESSION: Scattered osteoarthritis appears worst at the scaphoid trapezium trapezoid joint, IP joint of the thumb and DIP joint of the index finger. Degenerative change about the third MCP joint is likely due to CPPD arthropathy given TFC chondrocalcinosis. No acute finding. Electronically Signed   By: Inge Rise M.D.   On: 01/24/2018 15:59     ASSESSMENT and PLAN  1. Type 2 diabetes mellitus with hyperglycemia, with long-term current use of insulin (HCC) cbgs stable, slowly improving, having difficulty with vial/syringes, will do a trial of going back on humalog, will see if daytime cbgs hold steady, if start to raise then will have to go back on regular.  - POCT glucose (manual entry) - POCT glycosylated hemoglobin (Hb A1C)  2. Anemia, unspecified type Tolerating iron well, rechecking h/h, consider transfusion if has dropped - CBC - Celiac Disease Ab Screen w/Rfx  3. Polyarthritis Wondering if related to GI issues....will do topical NSAIDs as unable to do oral NSAIDS nor APAP - Rheumatoid factor - ANA w/Reflex if Positive - DG Ankle Complete Left; Future  4. Positive colorectal cancer screening using Cologuard test - Ambulatory referral to Gastroenterology  5. Abnormal results of liver function studies Per GI, gen surg  6. Duodenal ulcer disease Stopping all oral  NSAIDS, cont with GI protection  7. Essential  Borderline low, decrease HCTZ to 1/2 tab (12.39m) recheck at next visit  Other orders - Insulin Glargine (LANTUS SOLOSTAR) 100 UNIT/ML Solostar Pen; Inject 26 Units into the skin daily at 10 pm. - hydrochlorothiazide (HYDRODIURIL) 25 MG tablet; Take 0.5 tablets (12.5 mg total) by mouth daily. - insulin regular (NOVOLIN  R,HUMULIN R) 100 units/mL injection; 5 units before each meal, add 2 units for each 50 above glucose of 150, TDD 20. Dx E11.65, z79.84 - diclofenac sodium (VOLTAREN) 1 % GEL; Apply 2 g topically 4 (four) times daily.  Return in about 2 weeks (around 02/07/2018).    IRutherford Guys MD [facility] [place] [id number].  [phone] [phone]

## 2018-01-24 NOTE — Patient Instructions (Addendum)
Judith [name] , Thank you for taking time to come for your Medicare Wellness Visit. I appreciate your ongoing commitment to your health goals. Please review the following plan we discussed and let me know if I can assist you in the future.   Screening recommendations/referrals: Colonoscopy: Cologuard completed on 01/20/18, next due in 3 years Mammogram: stops at age 77 Bone Density: declined  Recommended yearly ophthalmology/optometry visit for glaucoma screening and checkup Recommended yearly dental visit for hygiene and checkup                                       Vaccinations: Influenza vaccine: You state you had this done at the hospital in 09/2017 Pneumococcal vaccine: You state you had this done at the hospital in 09/2017 Tdap vaccine: declined due to insurance Shingles vaccine: Check with your pharmacy about receiving Shingrix vaccine    Advanced directives: on file  Conditions/risks identified: Try to work on exercising more on a consistent basis  Next appointment: 01/24/18 @ 2:20 pm with Dr. Pamella Pert, next Medicare Wellness visit is 01/29/2019 @ 9:20 am with [facility] 65 Years and Older, Female Preventive care refers to lifestyle choices and visits with your health care provider that can promote health and wellness. What does preventive care include?  A yearly physical exam. This is also called an annual well check.  Dental exams once or twice a year.  Routine eye exams. Ask your health care provider how often you should have your eyes checked.  Personal lifestyle choices, including:  Daily care of your teeth and gums.  Regular physical activity.  Eating a healthy diet.  Avoiding tobacco and drug use.  Limiting alcohol use.  Practicing safe sex.  Taking low-dose aspirin every day.  Taking vitamin and mineral supplements as recommended by your health care provider. What happens during an annual well check? The services and screenings  done by your health care provider during your annual well check will depend on your age, overall health, lifestyle risk factors, and family history of disease. Counseling  Your health care provider may ask you questions about your:  Alcohol use.  Tobacco use.  Drug use.  Emotional well-being.  Home and relationship well-being.  Sexual activity.  Eating habits.  History of falls.  Memory and ability to understand (cognition).  Work and work Statistician.  Reproductive health. Screening  You may have the following tests or measurements:  Height, weight, and BMI.  Blood pressure.  Lipid and cholesterol levels. These may be checked every 5 years, or more frequently if you are over 66 years old.  Skin check.  Lung cancer screening. You may have this screening every year starting at age 25 if you have a 30-pack-year history of smoking and currently smoke or have quit within the past 15 years.  Fecal occult blood test (FOBT) of the stool. You may have this test every year starting at age 29.  Flexible sigmoidoscopy or colonoscopy. You may have a sigmoidoscopy every 5 years or a colonoscopy every 10 years starting at age 76.  Hepatitis C blood test.  Hepatitis B blood test.  Sexually transmitted disease (STD) testing.  Diabetes screening. This is done by checking your blood sugar (glucose) after you have not eaten for a while (fasting). You may have this done every 1-3 years.  Bone density scan. This is done to screen  for osteoporosis. You may have this done starting at age 61.  Mammogram. This may be done every 1-2 years. Talk to your health care provider about how often you should have regular mammograms. Talk with your health care provider about your test results, treatment options, and if necessary, the need for more tests. Vaccines  Your health care provider may recommend certain vaccines, such as:  Influenza vaccine. This is recommended every year.  Tetanus,  diphtheria, and acellular pertussis (Tdap, Td) vaccine. You may need a Td booster every 10 years.  Zoster vaccine. You may need this after age 50.  Pneumococcal 13-valent conjugate (PCV13) vaccine. One dose is recommended after age 68.  Pneumococcal polysaccharide (PPSV23) vaccine. One dose is recommended after age 38. Talk to your health care provider about which screenings and vaccines you need and how often you need them. This information is not intended to replace advice given to you by your health care provider. Make sure you discuss any questions you have with your health care provider. Document Released: 01/06/2016 Document Revised: 08/29/2016 Document Reviewed: 10/11/2015 Elsevier Interactive Patient Education  2017 [employer] Prevention in the Home Falls can cause injuries. They can happen to people of all ages. There are many things you can do to make your home safe and to help prevent falls. What can I do on the outside of my home?  Regularly fix the edges of walkways and driveways and fix any cracks.  Remove anything that might make you trip as you walk through a door, such as a raised step or threshold.  Trim any bushes or trees on the path to your home.  Use bright outdoor lighting.  Clear any walking paths of anything that might make someone trip, such as rocks or tools.  Regularly check to see if handrails are loose or broken. Make sure that both sides of any steps have handrails.  Any raised decks and porches should have guardrails on the edges.  Have any leaves, snow, or ice cleared regularly.  Use sand or salt on walking paths during winter.  Clean up any spills in your garage right away. This includes oil or grease spills. What can I do in the bathroom?  Use night lights.  Install grab bars by the toilet and in the tub and shower. Do not use towel bars as grab bars.  Use non-skid mats or decals in the tub or shower.  If you need to sit down in  the shower, use a plastic, non-slip stool.  Keep the floor dry. Clean up any water that spills on the floor as soon as it happens.  Remove soap buildup in the tub or shower regularly.  Attach bath mats securely with double-sided non-slip rug tape.  Do not have throw rugs and other things on the floor that can make you trip. What can I do in the bedroom?  Use night lights.  Make sure that you have a light by your bed that is easy to reach.  Do not use any sheets or blankets that are too big for your bed. They should not hang down onto the floor.  Have a firm chair that has side arms. You can use this for support while you get dressed.  Do not have throw rugs and other things on the floor that can make you trip. What can I do in the kitchen?  Clean up any spills right away.  Avoid walking on wet floors.  Keep items that you  use a lot in easy-to-reach places.  If you need to reach something above you, use a strong step stool that has a grab bar.  Keep electrical cords out of the way.  Do not use floor polish or wax that makes floors slippery. If you must use wax, use non-skid floor wax.  Do not have throw rugs and other things on the floor that can make you trip. What can I do with my stairs?  Do not leave any items on the stairs.  Make sure that there are handrails on both sides of the stairs and use them. Fix handrails that are broken or loose. Make sure that handrails are as long as the stairways.  Check any carpeting to make sure that it is firmly attached to the stairs. Fix any carpet that is loose or worn.  Avoid having throw rugs at the top or bottom of the stairs. If you do have throw rugs, attach them to the floor with carpet tape.  Make sure that you have a light switch at the top of the stairs and the bottom of the stairs. If you do not have them, ask someone to add them for you. What else can I do to help prevent falls?  Wear shoes that:  Do not have high  heels.  Have rubber bottoms.  Are comfortable and fit you well.  Are closed at the toe. Do not wear sandals.  If you use a stepladder:  Make sure that it is fully opened. Do not climb a closed stepladder.  Make sure that both sides of the stepladder are locked into place.  Ask someone to hold it for you, if possible.  Clearly mark and make sure that you can see:  Any grab bars or handrails.  First and last steps.  Where the edge of each step is.  Use tools that help you move around (mobility aids) if they are needed. These include:  Canes.  Walkers.  Scooters.  Crutches.  Turn on the lights when you go into a dark area. Replace any light bulbs as soon as they burn out.  Set up your furniture so you have a clear path. Avoid moving your furniture around.  If any of your floors are uneven, fix them.  If there are any pets around you, be aware of where they are.  Review your medicines with your doctor. Some medicines can make you feel dizzy. This can increase your chance of falling. Ask your doctor what other things that you can do to help prevent falls. This information is not intended to replace advice given to you by your health care provider. Make sure you discuss any questions you have with your health care provider. Document Released: 10/06/2009 Document Revised: 05/17/2016 Document Reviewed: 01/14/2015 Elsevier Interactive Patient Education  2017 [employer].

## 2018-01-24 NOTE — Patient Instructions (Signed)
     IF you received an x-ray today, you will receive an invoice from [facility]. Please contact [facility] at [phone] with questions or concerns regarding your invoice.   IF you received labwork today, you will receive an invoice from [facility]. Please contact [facility] at [phone] with questions or concerns regarding your invoice.   Our billing staff will not be able to assist you with questions regarding bills from these companies.  You will be contacted with the lab results as soon as they are available. The fastest way to get your results is to activate your My Chart account. Instructions are located on the last page of this paperwork. If you have not heard from us regarding the results in 2 weeks, please contact this office.     

## 2018-01-25 ENCOUNTER — Other Ambulatory Visit: Payer: Self-pay | Admitting: Family Medicine

## 2018-01-27 ENCOUNTER — Telehealth: Payer: Self-pay

## 2018-01-27 ENCOUNTER — Telehealth: Payer: Self-pay | Admitting: Family Medicine

## 2018-01-27 DIAGNOSIS — K3184 Gastroparesis: Secondary | ICD-10-CM | POA: Diagnosis not present

## 2018-01-27 DIAGNOSIS — Z980 Intestinal bypass and anastomosis status: Secondary | ICD-10-CM | POA: Diagnosis not present

## 2018-01-27 DIAGNOSIS — K8020 Calculus of gallbladder without cholecystitis without obstruction: Secondary | ICD-10-CM | POA: Diagnosis not present

## 2018-01-27 DIAGNOSIS — R945 Abnormal results of liver function studies: Secondary | ICD-10-CM | POA: Diagnosis not present

## 2018-01-27 LAB — RHEUMATOID FACTOR: Rhuematoid fact SerPl-aCnc: <10 IU/mL (ref 0.0–13.9)

## 2018-01-27 LAB — CBC
Hematocrit: 32 % — ABNORMAL LOW (ref 34.0–46.6)
Hemoglobin: 9.8 g/dL — ABNORMAL LOW (ref 11.1–15.9)
MCH: 24.3 pg — ABNORMAL LOW (ref 26.6–33.0)
MCHC: 30.6 g/dL — ABNORMAL LOW (ref 31.5–35.7)
MCV: 79 fL (ref 79–97)
Platelets: 526 10*3/uL — ABNORMAL HIGH (ref 150–379)
RBC: 4.03 x10E6/uL (ref 3.77–5.28)
RDW: 23.2 % — ABNORMAL HIGH (ref 12.3–15.4)
WBC: 10 10*3/uL (ref 3.4–10.8)

## 2018-01-27 LAB — ANA W/REFLEX IF POSITIVE
Anti JO-1: <0.2 AI (ref 0.0–0.9)
Anti Nuclear Antibody(ANA): POSITIVE — AB
Centromere Ab Screen: <0.2 AI (ref 0.0–0.9)
Chromatin Ab SerPl-aCnc: <0.2 AI (ref 0.0–0.9)
ENA RNP Ab: 1.6 AI — ABNORMAL HIGH (ref 0.0–0.9)
ENA SM Ab Ser-aCnc: <0.2 AI (ref 0.0–0.9)
ENA SSA (RO) Ab: <0.2 AI (ref 0.0–0.9)
ENA SSB (LA) Ab: <0.2 AI (ref 0.0–0.9)
Scleroderma SCL-70: <0.2 AI (ref 0.0–0.9)
dsDNA Ab: 1 IU/mL (ref 0–9)

## 2018-01-27 LAB — CELIAC DISEASE AB SCREEN W/RFX
Antigliadin Abs, IgA: 5 units (ref 0–19)
IgA/Immunoglobulin A, Serum: 350 mg/dL (ref 64–422)
Transglutaminase IgA: <2 U/mL (ref 0–3)

## 2018-01-27 NOTE — Telephone Encounter (Signed)
Got a request through cover my meds to work on this patient's diclofenac sodium PA.  Key is [id number] and could take up to 5 business days for approval.  Should receive something via fax.

## 2018-01-27 NOTE — Telephone Encounter (Signed)
Copied from [place] [phone]. Topic: Quick Communication - See Telephone Encounter >> Jan 27, 2018  2:14 PM Conception Chancy, NT wrote: CRM for notification. See Telephone encounter for:  01/27/18.  [email or web address] is calling and is needing more information in regards to Voltaren.   Case# [id number] CB#[phone] opt. 3 CB# [phone]

## 2018-01-28 ENCOUNTER — Telehealth: Payer: Self-pay | Admitting: *Deleted

## 2018-01-28 NOTE — Telephone Encounter (Signed)
I have spoken to patient's daughter/caregiver, [name] to advise of Dr Vena Rua recommendations. Patient has been scheduled for colonoscopy in [place] on 02/11/18 at 230 pm on 02/11/18 and has been scheduled for previsit on 01/31/18 at 830 am. [name] verbalizes understanding of time/date/location of these appointments.   Of note, [name] states that at this time, [place] feels that patient is too high surgical risk to do any surgical procedure on her, so no surgery planned. Her next appointment with [facility] is not planned until March.

## 2018-01-28 NOTE — Telephone Encounter (Signed)
done

## 2018-01-28 NOTE — Telephone Encounter (Signed)
-----   Message from Jerene Bears, MD sent at 01/27/2018  9:49 AM EST ----- [facility] is surgical consult for gallstones/CBD stone. This evaluation needs to not be interrupted, however, she also needs to schedule the colonoscopy as soon as possible for her. Colon can be done during this eval, I just do not want it to interfere with her surgery and if colonoscopy after surgery then would wait 1 month after surgery. JMP  ----- Message ----- From: Larina Bras, CMA Sent: 01/27/2018   9:44 AM To: Jerene Bears, MD  She is also having work up at [facility]. Is this independent of that or do I need to wait on that work up to be completed? ----- Message ----- From: Jerene Bears, MD Sent: 01/24/2018   6:13 PM To: Larina Bras, CMA  Pt needs colonoscopy due to positive Cologuard JMP  ----- Message ----- From: Rutherford Guys, MD Sent: 01/24/2018   3:27 PM To: Jerene Bears, MD  Hello Dr Hilarie Fredrickson,  I just made a referral for a colonscopy for Mrs Criswell as her cologuard came back positive. I might transfuse her if her H/H have dropped some more. Also she is exhibiting polyarthritis with synovitis today in clinic. Makes me wonder about an autoimmune GI process.  Thank you, Rutherford Guys

## 2018-01-31 ENCOUNTER — Telehealth: Payer: Self-pay | Admitting: *Deleted

## 2018-01-31 ENCOUNTER — Ambulatory Visit (AMBULATORY_SURGERY_CENTER): Payer: Self-pay | Admitting: *Deleted

## 2018-01-31 ENCOUNTER — Encounter: Payer: Self-pay | Admitting: *Deleted

## 2018-01-31 ENCOUNTER — Other Ambulatory Visit: Payer: Self-pay

## 2018-01-31 VITALS — Ht 60.0 in | Wt 105.0 lb

## 2018-01-31 DIAGNOSIS — R195 Other fecal abnormalities: Secondary | ICD-10-CM

## 2018-01-31 MED ORDER — METOCLOPRAMIDE HCL 10 MG PO TABS: 10.0000 mg | ORAL_TABLET | ORAL | 0 refills | Status: DC

## 2018-01-31 NOTE — Progress Notes (Signed)
No egg or soy allergy known to patient  Pt. Had EGD and aspirated in July in [place]   No diet pills per patient No home 02 use per patient  No blood thinners per patient  Pt denies issues with constipation  A fib or A flutter Pacemaker  EMMI video sent to pt's e mail

## 2018-01-31 NOTE — Telephone Encounter (Signed)
Spoke to patient's daughter [name] by phone  Her gastric emptying study confirmed the diagnosis of gastroparesis, and fortunately her symptoms of this have improved recently. I recommended she continue the gastroparesis diet and small more frequent meals  Regarding her colonoscopy in preparation given gastroparesis and history of gastrojejunostomy, I think we should give her metoclopramide 10 mg 1 hour before each half of her bowel preparation. I also recommend we instruct her to drink her bowel preparation over 3 hours, rather than 2 hours (start 1 hour earlier and slow the prep down slightly to allow for better gastric emptying and hopefully better toleration)  Please communicate the change in instructions with the patient's daughter Thanks

## 2018-01-31 NOTE — Telephone Encounter (Signed)
Called daughter Johnnette Litter and explained to her the instructions given by Dr. Hilarie Fredrickson in previous note.  I sent in Rx. For Metoclopramide 10 mg #2 for patient to take 1 tablet 1 hour prior to starting the prep.  All questions were answered and concerns addressed.  B.Benitez, CMA  PV

## 2018-01-31 NOTE — Telephone Encounter (Signed)
Dr. Hilarie Fredrickson,  Patient came in today for her pre visit with me along with her daughter Judith [name].  Patient had a gastric emptying study and CT done at [facility] on 2/4.  The results are in Epic in chart everywhere.  The daughter would like for you to review and go over results with them prior to patient coming for her colonoscopy on 2/19.  Patien is not due to go back to see that doctor until 3/1.  She has had problems in the past with apiration during EGD and had a Gastrectomy.  They are concerned about being sedated.  Please review and call patient to discuss.   Thank you!   B. Benitez, CMA

## 2018-02-05 ENCOUNTER — Encounter: Payer: Self-pay | Admitting: *Deleted

## 2018-02-05 ENCOUNTER — Telehealth: Payer: Self-pay | Admitting: Internal Medicine

## 2018-02-05 ENCOUNTER — Telehealth: Payer: Self-pay

## 2018-02-05 NOTE — Telephone Encounter (Signed)
Spoke with Judith [name]- daughter - informed we have a Plenvu sample- pt will need to pick up new instructions and Prep 4th floor today- daughter verbalized understanding- added reglan and 3 hour time frame per Dr Hilarie Fredrickson to instructions   [name]   Medication Samples have been provided to the patient.  Drug name:  plenvu              Qty: 1 kit  LOT: [id number]  Exp.Date: 05-2019  Dosing instructions: as directed   The patient has been instructed regarding the correct time, dose, and frequency of taking this medication, including desired effects and most common side effects.   [name] 11:28 AM 02/05/2018

## 2018-02-05 NOTE — Telephone Encounter (Signed)
Suprep sample requested by Pamala Hurry. Did not have Suprep but did have Plenvu sample.  Created new Plenvu instructions and the sample and gave to [place].  She will call patient regarding the change in prep.

## 2018-02-06 ENCOUNTER — Telehealth: Payer: Self-pay | Admitting: Cardiology

## 2018-02-06 ENCOUNTER — Encounter: Payer: Medicare Other | Admitting: *Deleted

## 2018-02-06 NOTE — Telephone Encounter (Signed)
LMOVM for pt to return call. She will need a Device clinic appt in 3 months. Not remote compatible.

## 2018-02-07 ENCOUNTER — Other Ambulatory Visit: Payer: Self-pay

## 2018-02-07 ENCOUNTER — Ambulatory Visit (INDEPENDENT_AMBULATORY_CARE_PROVIDER_SITE_OTHER): Payer: Medicare Other | Admitting: Family Medicine

## 2018-02-07 ENCOUNTER — Encounter: Payer: Self-pay | Admitting: Family Medicine

## 2018-02-07 VITALS — BP 102/68 | HR 75 | Temp 97.9°F | Ht 63.0 in | Wt 105.0 lb

## 2018-02-07 DIAGNOSIS — R195 Other fecal abnormalities: Secondary | ICD-10-CM

## 2018-02-07 DIAGNOSIS — K3184 Gastroparesis: Secondary | ICD-10-CM | POA: Diagnosis not present

## 2018-02-07 DIAGNOSIS — Z794 Long term (current) use of insulin: Secondary | ICD-10-CM

## 2018-02-07 DIAGNOSIS — E1165 Type 2 diabetes mellitus with hyperglycemia: Secondary | ICD-10-CM

## 2018-02-07 NOTE — Patient Instructions (Signed)
     IF you received an x-ray today, you will receive an invoice from [facility]. Please contact [facility] at [phone] with questions or concerns regarding your invoice.   IF you received labwork today, you will receive an invoice from [facility]. Please contact [facility] at [phone] with questions or concerns regarding your invoice.   Our billing staff will not be able to assist you with questions regarding bills from these companies.  You will be contacted with the lab results as soon as they are available. The fastest way to get your results is to activate your My Chart account. Instructions are located on the last page of this paperwork. If you have not heard from us regarding the results in 2 weeks, please contact this office.     

## 2018-02-07 NOTE — Progress Notes (Signed)
2/15/20199:33 AM  Judith [name] 1941/07/11, 77 y.o. female [id number]  Chief Complaint  Patient presents with  . Follow-up    diabetes chk up    HPI:   Patient is a 77 y.o. female who presents today for follow-up.  She comes in with her daughter  Had gastric emptying study + gastroparesis Scheduled for next week for colonoscopy Reports eating less - back to her normal portions, still struggling with gastroparesis diet, has meet with nutritionist.  Had to decrease lantus to 24 units, back to using humalog pen 5 +2/50/150, as easier to manipulate than syringes Brings in cbgs, yesterday fasting 101, has had some lows either fasting or before lunch. Lowest 66, felt hungry but no sweats, shakes, etc. Before dinner continue to be the highest, this week avg cbg 168. Multiple joint swelling and pain, resolving, doing well with topical diclofenac Has no acute concerns today  Depression screen [facility] 2/9 02/07/2018 01/24/2018 01/24/2018  Decreased Interest 0 0 1  Down, Depressed, Hopeless 0 0 0  PHQ - 2 Score 0 0 1  Altered sleeping - - 3  Tired, decreased energy - - 3  Change in appetite - - 3  Feeling bad or failure about yourself  - - 0  Trouble concentrating - - 0  Moving slowly or fidgety/restless - - 0  Suicidal thoughts - - 0  PHQ-9 Score - - 10  Difficult doing work/chores - - Somewhat difficult    Allergies  Allergen Reactions  . Asa [Aspirin]   . Sulfa Antibiotics     Prior to Admission medications   Medication Sig Start Date End Date Taking? Authorizing Provider  Biotin 1000 MCG tablet Take 1,000 mcg by mouth daily.   Yes [provider]  blood glucose meter kit and supplies KIT Dispense based on patient and insurance preference. Check capillary glucose three times a day, before meals.  Dx E11.65, Z79.4 10/29/17  Yes Rutherford Guys, MD  carvedilol (COREG) 12.5 MG tablet TAKE 1 TABLET (12.5 MG TOTAL) 2 (TWO) TIMES DAILY WITH A MEAL BY MOUTH. 01/27/18  Yes  Rutherford Guys, MD  cyclobenzaprine (FLEXERIL) 5 MG tablet Take 1 tablet (5 mg total) by mouth 3 (three) times daily as needed for muscle spasms. 11/28/17  Yes Rutherford Guys, MD  diclofenac sodium (VOLTAREN) 1 % GEL Apply 2 g topically 4 (four) times daily. 01/24/18  Yes Rutherford Guys, MD  diphenhydrAMINE (BENADRYL) 25 MG tablet Take 50 mg by mouth at bedtime.   Yes [provider]  DULoxetine (CYMBALTA) 60 MG capsule TAKE 1 CAPSULE (60 MG TOTAL) DAILY BY MOUTH. 01/27/18  Yes Rutherford Guys, MD  ferrous sulfate 325 (65 FE) MG tablet Take 1 tablet (325 mg total) by mouth daily with breakfast. 01/01/18  Yes Rutherford Guys, MD  gabapentin (NEURONTIN) 100 MG capsule Take 1 capsule in AM and afternoon and take 3 capsules at bedtime 11/28/17  Yes Rutherford Guys, MD  glucose blood (ONE TOUCH ULTRA TEST) test strip USE TO CHECK GLUCOSE LEVELS 3 TIMES A DAY, DX E11.9, Z79.4 11/30/17  Yes Rutherford Guys, MD  hydrochlorothiazide (HYDRODIURIL) 25 MG tablet Take 0.5 tablets (12.5 mg total) by mouth daily. 01/24/18  Yes Rutherford Guys, MD  Insulin Glargine (LANTUS SOLOSTAR) 100 UNIT/ML Solostar Pen Inject 26 Units into the skin daily at 10 pm. 01/24/18  Yes Rutherford Guys, MD  Insulin Lispro (HUMALOG PEN [place]) Inject 1 Dose into the skin 3 (three)  times daily. With every meal up to 3 times per day   Yes [provider]  Insulin Pen Needle (B-D ULTRAFINE III SHORT PEN) 31G X 8 MM MISC To use as directed with insulin, four times a day. Dx E11.9, Z79.4 11/08/17  Yes Rutherford Guys, MD  insulin regular (NOVOLIN R,HUMULIN R) 100 units/mL injection 5 units before each meal, add 2 units for each 50 above glucose of 150, TDD 20. Dx E11.65, z79.84 01/24/18  Yes Rutherford Guys, MD  Insulin Syringe-Needle U-100 (INSULIN SYRINGE .5CC/31GX5/16") 31G X 5/16" 0.5 ML MISC Use one new syringe with each dose of regular insulin, TID AC, Dx E11.65, z79.84 12/26/17  Yes Rutherford Guys, MD  losartan (COZAAR) 25  MG tablet TAKE 1 TABLET BY MOUTH EVERY DAY 01/27/18  Yes Rutherford Guys, MD  metoCLOPramide (REGLAN) 10 MG tablet Take 1 tablet (10 mg total) by mouth as directed. Take 1 tablet 1 hour prior to starting each half of colonoscopy prep. 01/31/18  Yes Pyrtle, Lajuan Lines, MD  Multiple Vitamin (MULTIVITAMIN) tablet Take 1 tablet daily by mouth.   Yes [provider]  pantoprazole (PROTONIX) 40 MG tablet TAKE 1 TABLET (40 MG TOTAL) DAILY BY MOUTH. 01/27/18  Yes Rutherford Guys, MD  ranitidine (ZANTAC) 150 MG capsule Take 1 capsule (150 mg total) at bedtime by mouth. 10/30/17  Yes Rutherford Guys, MD    Past Medical History:  Diagnosis Date  . Anemia   . Arthritis   . Biliary gastritis   . Blood transfusion without reported diagnosis    2007  . Depression   . Diabetes mellitus type 2 with complications ([place])   . Duodenal ulcer disease   . Encounter for long-term (current) use of insulin ([place])   . Gastric ulcer   . Gastroparesis    severe  . GERD (gastroesophageal reflux disease)   . Gout    tested for gout but was not  . Heart murmur   . Hyperlipidemia    in past not now  . Hypertension   . Hypokalemia, gastrointestinal losses    secondary to severe gastroparesis  . Osteoporosis    just had bone density test possible   . Peripheral neuropathy   . Presence of cardiac pacemaker   . S/P partial gastrectomy    due to severe gastric and gudoenal ulcers  . Seasonal allergies   . Sleep apnea    could not use CPAP  . UTI (urinary tract infection)     Past Surgical History:  Procedure Laterality Date  . COLONOSCOPY    . CORONARY ARTERY BYPASS GRAFT     pt. denies  incorrect  . EUS N/A 12/12/2017   Procedure: UPPER ENDOSCOPIC ULTRASOUND (EUS) RADIAL;  Surgeon: [name], MD;  Location: [facility];  Service: Endoscopy;  Laterality: N/A;  . GASTRECTOMY    . JOINT REPLACEMENT    . pace maker    . PARTIAL GASTRECTOMY    . TONSILLECTOMY     45 years ago  . UPPER  GASTROINTESTINAL ENDOSCOPY      Social History   Tobacco Use  . Smoking status: Never Smoker  . Smokeless tobacco: Never Used  Substance Use Topics  . Alcohol use: No    Alcohol/week: 0.0 oz    Family History  Problem Relation Age of Onset  . Diabetes Mother   . Diabetes Father   . Heart disease Father   . Diabetes Sister   . Heart  disease Son   . Colon cancer Neg Hx   . Colon polyps Neg Hx   . Rectal cancer Neg Hx   . Stomach cancer Neg Hx   . Esophageal cancer Neg Hx     Review of Systems  Constitutional: Negative for chills and fever.  Respiratory: Negative for cough and shortness of breath.   Cardiovascular: Negative for chest pain, palpitations and leg swelling.  Gastrointestinal: Negative for abdominal pain, nausea and vomiting.  Endo/Heme/Allergies: Negative for polydipsia.    OBJECTIVE:  Blood pressure 102/68, pulse 75, temperature 97.9 F (36.6 C), temperature source Oral, height '5\' 3"'  (1.6 m), weight 105 lb (47.6 kg), SpO2 100 %.  Wt Readings from Last 3 Encounters:  02/07/18 105 lb (47.6 kg)  01/31/18 105 lb (47.6 kg)  01/24/18 106 lb (48.1 kg)    Physical Exam  Constitutional: She is oriented to person, place, and time and well-developed, well-nourished, and in no distress.  HENT:  Head: Normocephalic and atraumatic.  Mouth/Throat: Mucous membranes are normal.  Eyes: EOM are normal. Pupils are equal, round, and reactive to light. No scleral icterus.  Neck: Neck supple.  Pulmonary/Chest: Effort normal.  Neurological: She is alert and oriented to person, place, and time. Gait normal.  Skin: Skin is warm and dry.  Psychiatric: Mood and affect normal.  Nursing note and vitals reviewed. she uses a 4 wheel walker with seat   ASSESSMENT and PLAN  1. Type 2 diabetes mellitus with hyperglycemia, with long-term current use of insulin ([place]) 2. Positive colorectal cancer screening using Cologuard test 3. Gastroparesis  Overall patient is improved and  currently stable. Has instructions regarding management of insulin for colonoscopy. Has several upcoming appts with GI and endo. polyarthritis improved - labs + ANA w RNP ab, might need rheum evaluation, will defer at this time as she is doing better and might still undergo cholecystectomy given abnormal LFTs.   Return in about 6 weeks (around 03/21/2018).    Rutherford Guys, MD Primary Care at [facility] [address], [place] [address] Ph.  [phone] Fax [phone]

## 2018-02-10 NOTE — Telephone Encounter (Signed)
Spoke w/ pt daughter and informed her that pt was incorrectly scheduled for a remote appt b/c her PPM is not compatible w/ remote monitoring. Pt daughter verbalized understanding and agreed to an appt w/ [facility] on 05-15-18 at 3:00 PM.

## 2018-02-11 ENCOUNTER — Telehealth: Payer: Self-pay | Admitting: *Deleted

## 2018-02-11 ENCOUNTER — Other Ambulatory Visit: Payer: Self-pay

## 2018-02-11 ENCOUNTER — Ambulatory Visit (AMBULATORY_SURGERY_CENTER): Payer: Medicare Other | Admitting: Internal Medicine

## 2018-02-11 ENCOUNTER — Encounter: Payer: Self-pay | Admitting: Internal Medicine

## 2018-02-11 VITALS — BP 155/56 | HR 78 | Temp 97.7°F | Resp 14 | Ht 60.0 in | Wt 105.0 lb

## 2018-02-11 DIAGNOSIS — G4733 Obstructive sleep apnea (adult) (pediatric): Secondary | ICD-10-CM | POA: Diagnosis not present

## 2018-02-11 DIAGNOSIS — Z950 Presence of cardiac pacemaker: Secondary | ICD-10-CM | POA: Diagnosis not present

## 2018-02-11 DIAGNOSIS — K219 Gastro-esophageal reflux disease without esophagitis: Secondary | ICD-10-CM | POA: Diagnosis not present

## 2018-02-11 DIAGNOSIS — E119 Type 2 diabetes mellitus without complications: Secondary | ICD-10-CM | POA: Diagnosis not present

## 2018-02-11 DIAGNOSIS — R195 Other fecal abnormalities: Secondary | ICD-10-CM

## 2018-02-11 HISTORY — PX: COLONOSCOPY: SHX174

## 2018-02-11 MED ORDER — SODIUM CHLORIDE 0.9 % IV SOLN: 500.0000 mL | Freq: Once | INTRAVENOUS | Status: DC

## 2018-02-11 NOTE — Op Note (Signed)
[place] Patient Name: Judith [name] Procedure Date: 02/11/2018 2:25 PM MRN: [id number] Endoscopist: Jerene Bears , MD Age: 77 Referring MD:  Date of Birth: 08-30-1941 Gender: Female Account #: [id number] Procedure:                Colonoscopy Indications:              Positive Cologuard test Medicines:                Monitored Anesthesia Care Procedure:                Pre-Anesthesia Assessment:                           - Prior to the procedure, a History and Physical                            was performed, and patient medications and                            allergies were reviewed. The patient's tolerance of                            previous anesthesia was also reviewed. The risks                            and benefits of the procedure and the sedation                            options and risks were discussed with the patient.                            All questions were answered, and informed consent                            was obtained. Prior Anticoagulants: The patient has                            taken no previous anticoagulant or antiplatelet                            agents. ASA Grade Assessment: III - A patient with                            severe systemic disease. After reviewing the risks                            and benefits, the patient was deemed in                            satisfactory condition to undergo the procedure.                           After obtaining informed consent, the colonoscope  was passed under direct vision. Throughout the                            procedure, the patient's blood pressure, pulse, and                            oxygen saturations were monitored continuously. The                            Model PCF-[id number]) scope was introduced                            through the anus and advanced to the the cecum,                            identified by appendiceal orifice and  ileocecal                            valve. The colonoscopy was performed without                            difficulty. The patient tolerated the procedure                            well. The quality of the bowel preparation was                            adequate to identify polyps 6 mm and larger in                            size. There was a small amount of retained                            vegetable material that with irrigation and lavage                            was able to be cleared for adequate views. The                            ileocecal valve, appendiceal orifice, and rectum                            were photographed. Scope In: 2:28:00 PM Scope Out: 2:50:19 PM Scope Withdrawal Time: 0 hours 13 minutes 30 seconds  Total Procedure Duration: 0 hours 22 minutes 19 seconds  Findings:                 The digital rectal exam was normal.                           The colon (entire examined portion) appeared normal.                           Internal hemorrhoids were found during  retroflexion. The hemorrhoids were small. Complications:            No immediate complications. Estimated Blood Loss:     Estimated blood loss: none. Impression:               - The entire examined colon is normal.                           - Small internal hemorrhoids.                           - No specimens collected. Recommendation:           - Patient has a contact number available for                            emergencies. The signs and symptoms of potential                            delayed complications were discussed with the                            patient. Return to normal activities tomorrow.                            Written discharge instructions were provided to the                            patient.                           - Resume previous diet.                           - Continue present medications.                           - No repeat  colonoscopy due to age. Jerene Bears, MD 02/11/2018 2:59:02 PM This report has been signed electronically.

## 2018-02-11 NOTE — Telephone Encounter (Signed)
Left detailed message prescription was approved Voltaren Gel

## 2018-02-11 NOTE — Patient Instructions (Signed)
YOU HAD AN ENDOSCOPIC PROCEDURE TODAY AT [facility]:   Refer to the procedure report that was given to you for any specific questions about what was found during the examination.  If the procedure report does not answer your questions, please call your gastroenterologist to clarify.  If you requested that your care partner not be given the details of your procedure findings, then the procedure report has been included in a sealed envelope for you to review at your convenience later.  YOU SHOULD EXPECT: Some feelings of bloating in the abdomen. Passage of more gas than usual.  Walking can help get rid of the air that was put into your GI tract during the procedure and reduce the bloating. If you had a lower endoscopy (such as a colonoscopy or flexible sigmoidoscopy) you may notice spotting of blood in your stool or on the toilet paper. If you underwent a bowel prep for your procedure, you may not have a normal bowel movement for a few days.  Please Note:  You might notice some irritation and congestion in your nose or some drainage.  This is from the oxygen used during your procedure.  There is no need for concern and it should clear up in a day or so.  SYMPTOMS TO REPORT IMMEDIATELY:   Following lower endoscopy (colonoscopy or flexible sigmoidoscopy):  Excessive amounts of blood in the stool  Significant tenderness or worsening of abdominal pains  Swelling of the abdomen that is new, acute  Fever of 100F or higher   Following upper endoscopy (EGD)  Vomiting of blood or coffee ground material  New chest pain or pain under the shoulder blades  Painful or persistently difficult swallowing  New shortness of breath  Fever of 100F or higher  Black, tarry-looking stools  For urgent or emergent issues, a gastroenterologist can be reached at any hour by calling [phone].   DIET:  We do recommend a small meal at first, but then you may proceed to your regular diet.  Drink  plenty of fluids but you should avoid alcoholic beverages for 24 hours.  ACTIVITY:  You should plan to take it easy for the rest of today and you should NOT DRIVE or use heavy machinery until tomorrow (because of the sedation medicines used during the test).    FOLLOW UP: Our staff will call the number listed on your records the next business day following your procedure to check on you and address any questions or concerns that you may have regarding the information given to you following your procedure. If we do not reach you, we will leave a message.  However, if you are feeling well and you are not experiencing any problems, there is no need to return our call.  We will assume that you have returned to your regular daily activities without incident.  If any biopsies were taken you will be contacted by phone or by letter within the next 1-3 weeks.  Please call us at [phone] if you have not heard about the biopsies in 3 weeks.    SIGNATURES/CONFIDENTIALITY: You and/or your care partner have signed paperwork which will be entered into your electronic medical record.  These signatures attest to the fact that that the information above on your After Visit Summary has been reviewed and is understood.  Full responsibility of the confidentiality of this discharge information lies with you and/or your care-partner.  Hemorrhoid information given.

## 2018-02-11 NOTE — Progress Notes (Signed)
Report to PACU, RN, vss, BBS= Clear.  

## 2018-02-12 ENCOUNTER — Telehealth: Payer: Self-pay

## 2018-02-12 NOTE — Telephone Encounter (Signed)
  Follow up Call-  Call back number 02/11/2018  Post procedure Call Back phone  # [phone]  Phone comments call daughter [name]  Permission to leave phone message Yes  Some recent data might be hidden     Patient questions:  Do you have a fever, pain , or abdominal swelling? No. Pain Score  0 *  Have you tolerated food without any problems? Yes.    Have you been able to return to your normal activities? Yes.    Do you have any questions about your discharge instructions: Diet   No. Medications  No. Follow up visit  No.  Do you have questions or concerns about your Care? No.  Actions: * If pain score is 4 or above: No action needed, pain <4.

## 2018-02-12 NOTE — Telephone Encounter (Signed)
Attempted to reach patient for post-procedure f/u call. No answer. Left message that we will make another attempt to reach her again later today and for her to please not hesitate to call us if she has any questions/concerns regarding her care. 

## 2018-02-17 DIAGNOSIS — R748 Abnormal levels of other serum enzymes: Secondary | ICD-10-CM | POA: Diagnosis not present

## 2018-02-17 DIAGNOSIS — E0841 Diabetes mellitus due to underlying condition with diabetic mononeuropathy: Secondary | ICD-10-CM | POA: Diagnosis not present

## 2018-02-17 DIAGNOSIS — E0865 Diabetes mellitus due to underlying condition with hyperglycemia: Secondary | ICD-10-CM | POA: Diagnosis not present

## 2018-02-17 DIAGNOSIS — K3184 Gastroparesis: Secondary | ICD-10-CM | POA: Diagnosis not present

## 2018-02-18 ENCOUNTER — Other Ambulatory Visit: Payer: Self-pay

## 2018-02-18 ENCOUNTER — Ambulatory Visit (INDEPENDENT_AMBULATORY_CARE_PROVIDER_SITE_OTHER): Payer: Medicare Other | Admitting: Family Medicine

## 2018-02-18 ENCOUNTER — Encounter: Payer: Self-pay | Admitting: Family Medicine

## 2018-02-18 VITALS — BP 112/58 | HR 77 | Resp 16 | Ht 60.0 in | Wt 103.6 lb

## 2018-02-18 DIAGNOSIS — J069 Acute upper respiratory infection, unspecified: Secondary | ICD-10-CM | POA: Diagnosis not present

## 2018-02-18 NOTE — Progress Notes (Signed)
2/26/20192:50 PM  Judith [name] 05/21/1941, 77 y.o. female [id number]  Chief Complaint  Patient presents with  . Sore Throat    since Thursday and cough x 2 days with green phlegm     HPI:   Patient is a 77 y.o. female with past medical history significant for DM2, gastroparesis, abnormal LFTs, partial gastrectomy who presents today for sore throat, cough, nasal congestion, phlegm for past 3-4 days. Denies any fever or chills or SOB. Denies any sinus or ear pain. Daughter and son-in law recently sick with URI. Patient had tonsillectomy.   Depression screen [facility] 2/9 02/18/2018 02/07/2018 01/24/2018  Decreased Interest 0 0 0  Down, Depressed, Hopeless 0 0 0  PHQ - 2 Score 0 0 0  Altered sleeping - - -  Tired, decreased energy - - -  Change in appetite - - -  Feeling bad or failure about yourself  - - -  Trouble concentrating - - -  Moving slowly or fidgety/restless - - -  Suicidal thoughts - - -  PHQ-9 Score - - -  Difficult doing work/chores - - -    Allergies  Allergen Reactions  . Asa [Aspirin]   . Sulfa Antibiotics     Prior to Admission medications   Medication Sig Start Date End Date Taking? Authorizing Provider  Biotin 1000 MCG tablet Take 1,000 mcg by mouth daily.    [provider]  blood glucose meter kit and supplies KIT Dispense based on patient and insurance preference. Check capillary glucose three times a day, before meals.  Dx E11.65, Z79.4 10/29/17   Rutherford Guys, MD  carvedilol (COREG) 12.5 MG tablet TAKE 1 TABLET (12.5 MG TOTAL) 2 (TWO) TIMES DAILY WITH A MEAL BY MOUTH. 01/27/18   Rutherford Guys, MD  cyclobenzaprine (FLEXERIL) 5 MG tablet Take 1 tablet (5 mg total) by mouth 3 (three) times daily as needed for muscle spasms. 11/28/17   Rutherford Guys, MD  diclofenac sodium (VOLTAREN) 1 % GEL Apply 2 g topically 4 (four) times daily. 01/24/18   Rutherford Guys, MD  diphenhydrAMINE (BENADRYL) 25 MG tablet Take 50 mg by mouth at bedtime.    [provider]  DULoxetine (CYMBALTA) 60 MG capsule TAKE 1 CAPSULE (60 MG TOTAL) DAILY BY MOUTH. 01/27/18   Rutherford Guys, MD  ferrous sulfate 325 (65 FE) MG tablet Take 1 tablet (325 mg total) by mouth daily with breakfast. 01/01/18   Rutherford Guys, MD  gabapentin (NEURONTIN) 100 MG capsule Take 1 capsule in AM and afternoon and take 3 capsules at bedtime 11/28/17   Rutherford Guys, MD  glucose blood (ONE TOUCH ULTRA TEST) test strip USE TO CHECK GLUCOSE LEVELS 3 TIMES A DAY, DX E11.9, Z79.4 11/30/17   Rutherford Guys, MD  hydrochlorothiazide (HYDRODIURIL) 25 MG tablet Take 0.5 tablets (12.5 mg total) by mouth daily. 01/24/18   Rutherford Guys, MD  Insulin Glargine (LANTUS SOLOSTAR) 100 UNIT/ML Solostar Pen Inject 26 Units into the skin daily at 10 pm. 01/24/18   Rutherford Guys, MD  Insulin Lispro (HUMALOG PEN [place]) Inject 1 Dose into the skin 3 (three) times daily. With every meal up to 3 times per day    [provider]  Insulin Pen Needle (B-D ULTRAFINE III SHORT PEN) 31G X 8 MM MISC To use as directed with insulin, four times a day. Dx E11.9, Z79.4 11/08/17   Rutherford Guys, MD  insulin regular (NOVOLIN R,HUMULIN R)  100 units/mL injection 5 units before each meal, add 2 units for each 50 above glucose of 150, TDD 20. Dx E11.65, z79.84 01/24/18   Rutherford Guys, MD  Insulin Syringe-Needle U-100 (INSULIN SYRINGE .5CC/31GX5/16") 31G X 5/16" 0.5 ML MISC Use one new syringe with each dose of regular insulin, TID AC, Dx E11.65, z79.84 12/26/17   Rutherford Guys, MD  losartan (COZAAR) 25 MG tablet TAKE 1 TABLET BY MOUTH EVERY DAY 01/27/18   Rutherford Guys, MD  metoCLOPramide (REGLAN) 10 MG tablet Take 1 tablet (10 mg total) by mouth as directed. Take 1 tablet 1 hour prior to starting each half of colonoscopy prep. 01/31/18   Pyrtle, Lajuan Lines, MD  Multiple Vitamin (MULTIVITAMIN) tablet Take 1 tablet daily by mouth.    [provider]  pantoprazole (PROTONIX) 40 MG tablet TAKE 1 TABLET (40 MG  TOTAL) DAILY BY MOUTH. 01/27/18   Rutherford Guys, MD  ranitidine (ZANTAC) 150 MG capsule Take 1 capsule (150 mg total) at bedtime by mouth. 10/30/17   Rutherford Guys, MD    Past Medical History:  Diagnosis Date  . Anemia   . Arthritis   . Biliary gastritis   . Blood transfusion without reported diagnosis    2007  . Depression   . Diabetes mellitus type 2 with complications ([place])   . Duodenal ulcer disease   . Encounter for long-term (current) use of insulin ([place])   . Gastric ulcer   . Gastroparesis    severe  . GERD (gastroesophageal reflux disease)   . Gout    tested for gout but was not  . Heart murmur   . Hyperlipidemia    in past not now  . Hypertension   . Hypokalemia, gastrointestinal losses    secondary to severe gastroparesis  . Osteoporosis    just had bone density test possible   . Peripheral neuropathy   . Presence of cardiac pacemaker   . S/P partial gastrectomy    due to severe gastric and gudoenal ulcers  . Seasonal allergies   . Sleep apnea    could not use CPAP  . UTI (urinary tract infection)     Past Surgical History:  Procedure Laterality Date  . COLONOSCOPY    . CORONARY ARTERY BYPASS GRAFT     pt. denies  incorrect  . EUS N/A 12/12/2017   Procedure: UPPER ENDOSCOPIC ULTRASOUND (EUS) RADIAL;  Surgeon: [name], MD;  Location: [facility];  Service: Endoscopy;  Laterality: N/A;  . GASTRECTOMY    . JOINT REPLACEMENT    . pace maker    . PARTIAL GASTRECTOMY    . TONSILLECTOMY     45 years ago  . UPPER GASTROINTESTINAL ENDOSCOPY      Social History   Tobacco Use  . Smoking status: Never Smoker  . Smokeless tobacco: Never Used  Substance Use Topics  . Alcohol use: No    Alcohol/week: 0.0 oz    Family History  Problem Relation Age of Onset  . Diabetes Mother   . Diabetes Father   . Heart disease Father   . Diabetes Sister   . Heart disease Son   . Colon cancer Neg Hx   . Colon polyps Neg Hx   . Rectal cancer Neg Hx   .  Stomach cancer Neg Hx   . Esophageal cancer Neg Hx     ROS Per hpi  OBJECTIVE:  Blood pressure (!) 112/58, pulse 77, resp. rate 16, height  5' (1.524 m), weight 103 lb 9.6 oz (47 kg), SpO2 98 %.  Physical Exam  Constitutional: She is oriented to person, place, and time and well-developed, well-nourished, and in no distress.  HENT:  Head: Normocephalic and atraumatic.  Right Ear: Hearing, tympanic membrane, external ear and ear canal normal.  Left Ear: Hearing, tympanic membrane, external ear and ear canal normal.  Nose: Mucosal edema present. No rhinorrhea. Right sinus exhibits no maxillary sinus tenderness and no frontal sinus tenderness. Left sinus exhibits no maxillary sinus tenderness and no frontal sinus tenderness.  Mouth/Throat: Posterior oropharyngeal erythema present. No oropharyngeal exudate.  Eyes: EOM are normal. Pupils are equal, round, and reactive to light.  Neck: Neck supple.  Cardiovascular: Normal rate, regular rhythm and normal heart sounds. Exam reveals no gallop and no friction rub.  No murmur heard. Pulmonary/Chest: Effort normal and breath sounds normal. She has no wheezes. She has no rales.  Lymphadenopathy:    She has no cervical adenopathy.  Neurological: She is alert and oriented to person, place, and time. Gait normal.  Skin: Skin is warm and dry.     ASSESSMENT and PLAN  1. URI with cough and congestion Discussed supportive measures for URI: increase hydration, rest, OTC medications, etc. RTC precautions discussed.  Return if symptoms worsen or fail to improve.    Rutherford Guys, MD Primary Care at [place], [place] [address] Ph.  [phone] Fax [phone]

## 2018-02-18 NOTE — Patient Instructions (Addendum)
     IF you received an x-ray today, you will receive an invoice from [facility]. Please contact  Radiology at [phone] with questions or concerns regarding your invoice.   IF you received labwork today, you will receive an invoice from [facility]. Please contact [facility] at [phone] with questions or concerns regarding your invoice.   Our billing staff will not be able to assist you with questions regarding bills from these companies.  You will be contacted with the lab results as soon as they are available. The fastest way to get your results is to activate your My Chart account. Instructions are located on the last page of this paperwork. If you have not heard from us regarding the results in 2 weeks, please contact this office.     

## 2018-02-26 ENCOUNTER — Other Ambulatory Visit: Payer: Self-pay

## 2018-02-26 ENCOUNTER — Other Ambulatory Visit: Payer: Self-pay | Admitting: Family Medicine

## 2018-02-26 ENCOUNTER — Ambulatory Visit: Payer: Self-pay

## 2018-02-26 ENCOUNTER — Telehealth: Payer: Self-pay | Admitting: Family Medicine

## 2018-02-26 NOTE — Telephone Encounter (Signed)
Called and spoke to pt to remind them of their apt tomorrow. Advised of building number and time restrictions.

## 2018-02-26 NOTE — Telephone Encounter (Signed)
Patient's daughter called in with patient still having symptoms from sinus congestion. She said "she was seen in the office last week and the doctor has her on OTC medicines. She is still having green sinus drainage and it's not getting better. Her throat is a little sore and she has pain around her eyes 6 on pain scale. She is not running a fever. I was wondering if she will need to be seen." I advised that the note from the provider said return if symptoms worsen or don't improve, she verbalized understanding. Appointment made for tomorrow at 1120 with Dr. Pamella Pert, care advice given, daughter verbalized understanding.  Reason for Disposition . [1] Sinus congestion (pressure, fullness) AND [2] present > 10 days  Answer Assessment - Initial Assessment Questions 1. LOCATION: "Where does it hurt?"      Some pain around eyes and next to nose  2. ONSET: "When did the sinus pain start?"  (e.g., hours, days)      It was painful at the office visit, but has gotten worse 3. SEVERITY: "How bad is the pain?"   (Scale 1-10; mild, moderate or severe)   - MILD (1-3): doesn't interfere with normal activities    - MODERATE (4-7): interferes with normal activities (e.g., work or school) or awakens from sleep   - SEVERE (8-10): excruciating pain and patient unable to do any normal activities        6 4. RECURRENT SYMPTOM: "Have you ever had sinus problems before?" If so, ask: "When was the last time?" and "What happened that time?"      Yes, ongoing since last visit 5. NASAL CONGESTION: "Is the nose blocked?" If so, ask, "Can you open it or must you breathe through the mouth?"     Congested but can breath 6. NASAL DISCHARGE: "Do you have discharge from your nose?" If so ask, "What color?"     Green 7. FEVER: "Do you have a fever?" If so, ask: "What is it, how was it measured, and when did it start?"      no 8. OTHER SYMPTOMS: "Do you have any other symptoms?" (e.g., sore throat, cough, earache, difficulty  breathing)     Sore throat a little, cough from sinus drainage 9. PREGNANCY: "Is there any chance you are pregnant?" "When was your last menstrual period?"     No  Protocols used: SINUS PAIN OR CONGESTION-A-AH

## 2018-02-27 ENCOUNTER — Ambulatory Visit (INDEPENDENT_AMBULATORY_CARE_PROVIDER_SITE_OTHER): Payer: Medicare Other | Admitting: Family Medicine

## 2018-02-27 ENCOUNTER — Other Ambulatory Visit: Payer: Self-pay

## 2018-02-27 ENCOUNTER — Encounter: Payer: Self-pay | Admitting: Family Medicine

## 2018-02-27 VITALS — BP 135/84 | HR 77 | Temp 97.7°F | Wt 103.2 lb

## 2018-02-27 DIAGNOSIS — M130 Polyarthritis, unspecified: Secondary | ICD-10-CM

## 2018-02-27 DIAGNOSIS — J329 Chronic sinusitis, unspecified: Secondary | ICD-10-CM

## 2018-02-27 MED ORDER — AMOXICILLIN-POT CLAVULANATE 875-125 MG PO TABS: 1.0000 | ORAL_TABLET | Freq: Two times a day (BID) | ORAL | 0 refills | Status: DC

## 2018-02-27 NOTE — Progress Notes (Signed)
   Subjective:    Patient ID: Judith [name], female    DOB: 1941-10-20, 77 y.o.   MRN: [id number]  HPI   First came in on 02/18/2018 with chief complaint of "sore throat." Patient was seen by Dr. Pamella Pert and diagnosed with URI with cough and congestion. Supportive measures (phenylephrine 10 mg and saline nasal mist,) were recommended.  Patient has been using the Bendaryl/phenylephrine 10 mg and saline nasal spray. They have improved her symptoms, but she states that she is, "still coughing up green stuff about the size of a lifesaver." When patient came in on 02/18/2018, she was coughing daily. Today, she reports that she is no longer coughing every day. Patient reports that supportive measures mentioned above alleviate her symptoms moderately, and that drinking hot liquids worsens the cough as it loosens up the phlegm.  Patient has had all symptoms since 02/14/2018. Tomorrow, she will have had the cough for 2 weeks. Associated symptoms include maxillary sinus pressure, sore throat, and post nasal drip. Pertinent negatives: dizziness, tinnitus, ear pain, hearing loss.    Review of Systems  Constitutional: Positive for activity change, appetite change, diaphoresis (Night sweats that cause patient to change pajamas at night.), fatigue and unexpected weight change (Patient has lost 35 lbs since last January.).  HENT: Positive for congestion, postnasal drip, rhinorrhea, sinus pressure and sore throat. Negative for ear pain, hearing loss, sinus pain and tinnitus.   Eyes: Negative for discharge and itching.       No new eye problems since last visit.  Respiratory: Positive for cough. Negative for chest tightness and shortness of breath.   Cardiovascular: Negative for chest pain and palpitations.  Endocrine: Negative for polydipsia and polyuria.  Musculoskeletal: Positive for arthralgias (Uses voltaren for joint pain.). Negative for myalgias.  Skin: Positive for rash (Around mid section.). Negative for  color change.  Neurological: Positive for numbness (Diabetic neuropathy.). Negative for dizziness, light-headedness and headaches.       Objective:   Physical Exam  Constitutional: She is oriented to person, place, and time. She appears well-developed.  Patient visibly underweight. BP 135/84 (BP Location: Left Arm, Patient Position: Sitting, Cuff Size: Normal)   Pulse 77   Temp 97.7 F (36.5 C) (Oral)   Wt 103 lb 3.2 oz (46.8 kg)   SpO2 94%   BMI 20.15 kg/m   HENT:  Head: Normocephalic and atraumatic.  Right Ear: External ear normal.  Left Ear: External ear normal.  Nose: Nose normal.  Mucus noted between nasal turbinates. Pharynx erythematous.   Eyes: Conjunctivae and EOM are normal. Pupils are equal, round, and reactive to light.  Neck: Normal range of motion. Neck supple.  Cardiovascular: Normal rate, regular rhythm, normal heart sounds and intact distal pulses.  Radial pulses 1+ bilaterally.  Pulmonary/Chest: Effort normal and breath sounds normal.  Neurological: She is alert and oriented to person, place, and time. She has normal reflexes.  Skin: Skin is warm and dry. Rash (Small red bumps on both hips.) noted. No erythema.  Psychiatric: She has a normal mood and affect. Her behavior is normal. Judgment and thought content normal.      Assessment & Plan:  1. Sinusitis, unspecified chronicity, unspecified location - prescription for augmentin given. Discussed supportive measures and RTC precautions given.  2. Polyarthritis Patient with + ANA and rnp ab, neg xrays, recurring polyarthritis of mostly wrist and ankle joints - Ambulatory referral to Rheumatology

## 2018-02-27 NOTE — Patient Instructions (Signed)
     IF you received an x-ray today, you will receive an invoice from [facility]. Please contact [facility] at [phone] with questions or concerns regarding your invoice.   IF you received labwork today, you will receive an invoice from [facility]. Please contact [facility] at [phone] with questions or concerns regarding your invoice.   Our billing staff will not be able to assist you with questions regarding bills from these companies.  You will be contacted with the lab results as soon as they are available. The fastest way to get your results is to activate your My Chart account. Instructions are located on the last page of this paperwork. If you have not heard from us regarding the results in 2 weeks, please contact this office.     

## 2018-02-27 NOTE — Progress Notes (Deleted)
3/7/201912:08 PM  Judith [name] [birth year], 77 y.o. female [id number]  Chief Complaint  Patient presents with  . Follow-up    Counghing up phlegm, Took suggested Phenylephrine 35m, No relief from the nasal congestion    HPI:   Patient is a 77y.o. female with past medical history significant for *** who presents today for ***  Depression screen [facility]2/[facility] 02/27/2018 02/18/2018 02/07/2018  Decreased Interest 0 0 0  Down, Depressed, Hopeless 0 0 0  PHQ - 2 Score 0 0 0  Altered sleeping - - -  Tired, decreased energy - - -  Change in appetite - - -  Feeling bad or failure about yourself  - - -  Trouble concentrating - - -  Moving slowly or fidgety/restless - - -  Suicidal thoughts - - -  PHQ-9 Score - - -  Difficult doing work/chores - - -    Allergies  Allergen Reactions  . Asa [Aspirin]   . Sulfa Antibiotics     Prior to Admission medications   Medication Sig Start Date End Date Taking? Authorizing Provider  Biotin 1000 MCG tablet Take 1,000 mcg by mouth daily.   Yes [provider]  blood glucose meter kit and supplies KIT Dispense based on patient and insurance preference. Check capillary glucose three times a day, before meals.  Dx E11.65, Z79.4 10/29/17  Yes SRutherford Guys MD  carvedilol (COREG) 12.5 MG tablet TAKE 1 TABLET (12.5 MG TOTAL) 2 (TWO) TIMES DAILY WITH A MEAL BY MOUTH. 01/27/18  Yes SRutherford Guys MD  cyclobenzaprine (FLEXERIL) 5 MG tablet Take 1 tablet (5 mg total) by mouth 3 (three) times daily as needed for muscle spasms. 11/28/17  Yes SRutherford Guys MD  diclofenac sodium (VOLTAREN) 1 % GEL Apply 2 g topically 4 (four) times daily. 01/24/18  Yes SRutherford Guys MD  diphenhydrAMINE (BENADRYL) 25 MG tablet Take 50 mg by mouth at bedtime.   Yes [provider]  DULoxetine (CYMBALTA) 60 MG capsule TAKE 1 CAPSULE (60 MG TOTAL) DAILY BY MOUTH. 01/27/18  Yes SRutherford Guys MD  ferrous sulfate 325 (65 FE) MG tablet TAKE 1 TABLET BY MOUTH  EVERY DAY WITH BREAKFAST 02/26/18  Yes SRutherford Guys MD  gabapentin (NEURONTIN) 100 MG capsule Take 1 capsule in AM and afternoon and take 3 capsules at bedtime 11/28/17  Yes SRutherford Guys MD  glucose blood (ONE TOUCH ULTRA TEST) test strip USE TO CHECK GLUCOSE LEVELS 3 TIMES A DAY, DX E11.9, Z79.4 11/30/17  Yes SRutherford Guys MD  hydrochlorothiazide (HYDRODIURIL) 25 MG tablet Take 0.5 tablets (12.5 mg total) by mouth daily. 01/24/18  Yes SRutherford Guys MD  Insulin Glargine (LANTUS SOLOSTAR) 100 UNIT/ML Solostar Pen Inject 26 Units into the skin daily at 10 pm. Patient taking differently: Inject 24 Units into the skin daily at 10 pm.  01/24/18  Yes SRutherford Guys MD  Insulin Lispro (HUMALOG PEN [place]) Inject 1 Dose into the skin 3 (three) times daily. With every meal up to 3 times per day   Yes [provider]  Insulin Pen Needle (B-D ULTRAFINE III SHORT PEN) 31G X 8 MM MISC To use as directed with insulin, four times a day. Dx E11.9, Z79.4 11/08/17  Yes SRutherford Guys MD  losartan (COZAAR) 25 MG tablet TAKE 1 TABLET BY MOUTH EVERY DAY 01/27/18  Yes SRutherford Guys MD  Multiple Vitamin (MULTIVITAMIN) tablet Take 1 tablet daily by mouth.  Yes [provider]  pantoprazole (PROTONIX) 40 MG tablet TAKE 1 TABLET (40 MG TOTAL) DAILY BY MOUTH. 01/27/18  Yes Rutherford Guys, MD  phenylephrine (SUDAFED [place]) 10 MG TABS tablet Take 10 mg by mouth every 4 (four) hours as needed.   Yes [provider]  ranitidine (ZANTAC) 150 MG capsule Take 1 capsule (150 mg total) at bedtime by mouth. 10/30/17  Yes Rutherford Guys, MD    Past Medical History:  Diagnosis Date  . Anemia   . Arthritis   . Biliary gastritis   . Blood transfusion without reported diagnosis    2007  . Depression   . Diabetes mellitus type 2 with complications ([place])   . Duodenal ulcer disease   . Encounter for long-term (current) use of insulin ([place])   . Gastric ulcer   . Gastroparesis    severe  . GERD  (gastroesophageal reflux disease)   . Gout    tested for gout but was not  . Heart murmur   . Hyperlipidemia    in past not now  . Hypertension   . Hypokalemia, gastrointestinal losses    secondary to severe gastroparesis  . Osteoporosis    just had bone density test possible   . Peripheral neuropathy   . Presence of cardiac pacemaker   . S/P partial gastrectomy    due to severe gastric and gudoenal ulcers  . Seasonal allergies   . Sleep apnea    could not use CPAP  . UTI (urinary tract infection)     Past Surgical History:  Procedure Laterality Date  . COLONOSCOPY    . CORONARY ARTERY BYPASS GRAFT     pt. denies  incorrect  . EUS N/A 12/12/2017   Procedure: UPPER ENDOSCOPIC ULTRASOUND (EUS) RADIAL;  Surgeon: [name], MD;  Location: [facility];  Service: Endoscopy;  Laterality: N/A;  . GASTRECTOMY    . JOINT REPLACEMENT    . pace maker    . PARTIAL GASTRECTOMY    . TONSILLECTOMY     45 years ago  . UPPER GASTROINTESTINAL ENDOSCOPY      Social History   Tobacco Use  . Smoking status: Never Smoker  . Smokeless tobacco: Never Used  Substance Use Topics  . Alcohol use: No    Alcohol/week: 0.0 oz    Family History  Problem Relation Age of Onset  . Diabetes Mother   . Diabetes Father   . Heart disease Father   . Diabetes Sister   . Heart disease Son   . Colon cancer Neg Hx   . Colon polyps Neg Hx   . Rectal cancer Neg Hx   . Stomach cancer Neg Hx   . Esophageal cancer Neg Hx     ROS   OBJECTIVE:  Blood pressure 135/84, pulse 77, temperature 97.7 F (36.5 C), temperature source Oral, weight 103 lb 3.2 oz (46.8 kg), SpO2 94 %.  Physical Exam  No results found for this or any previous visit (from the past 24 hour(s)).  No results found.   ASSESSMENT and PLAN  ***  No Follow-up on file.    Rutherford Guys, MD Primary Care at [facility] [place], [place] [address] Ph.  [phone] Fax [phone]

## 2018-02-28 DIAGNOSIS — R945 Abnormal results of liver function studies: Secondary | ICD-10-CM | POA: Diagnosis not present

## 2018-02-28 DIAGNOSIS — K3184 Gastroparesis: Secondary | ICD-10-CM | POA: Diagnosis not present

## 2018-02-28 DIAGNOSIS — K8070 Calculus of gallbladder and bile duct without cholecystitis without obstruction: Secondary | ICD-10-CM | POA: Diagnosis not present

## 2018-03-01 ENCOUNTER — Encounter: Payer: Self-pay | Admitting: Family Medicine

## 2018-03-01 MED ORDER — DOXYCYCLINE HYCLATE 100 MG PO TABS: 100.0000 mg | ORAL_TABLET | Freq: Two times a day (BID) | ORAL | 0 refills | Status: DC

## 2018-03-08 ENCOUNTER — Encounter: Payer: Self-pay | Admitting: Family Medicine

## 2018-03-10 MED ORDER — TRIAMCINOLONE ACETONIDE 0.5 % EX CREA: 1.0000 "application " | TOPICAL_CREAM | Freq: Two times a day (BID) | CUTANEOUS | 1 refills | Status: DC

## 2018-03-10 NOTE — Telephone Encounter (Signed)
Can you please look into her referral to rheumatology and then resend message back to me? thanks

## 2018-03-14 ENCOUNTER — Encounter: Payer: Self-pay | Admitting: Family Medicine

## 2018-03-14 ENCOUNTER — Telehealth: Payer: Self-pay | Admitting: Family Medicine

## 2018-03-14 ENCOUNTER — Ambulatory Visit (INDEPENDENT_AMBULATORY_CARE_PROVIDER_SITE_OTHER): Payer: Medicare Other | Admitting: Family Medicine

## 2018-03-14 ENCOUNTER — Other Ambulatory Visit: Payer: Self-pay

## 2018-03-14 VITALS — BP 112/64 | HR 77 | Temp 98.0°F | Resp 16 | Ht 60.0 in | Wt 105.6 lb

## 2018-03-14 DIAGNOSIS — K3184 Gastroparesis: Secondary | ICD-10-CM | POA: Diagnosis not present

## 2018-03-14 DIAGNOSIS — E1142 Type 2 diabetes mellitus with diabetic polyneuropathy: Secondary | ICD-10-CM

## 2018-03-14 DIAGNOSIS — R945 Abnormal results of liver function studies: Secondary | ICD-10-CM | POA: Diagnosis not present

## 2018-03-14 DIAGNOSIS — M130 Polyarthritis, unspecified: Secondary | ICD-10-CM

## 2018-03-14 DIAGNOSIS — D508 Other iron deficiency anemias: Secondary | ICD-10-CM | POA: Diagnosis not present

## 2018-03-14 DIAGNOSIS — R7989 Other specified abnormal findings of blood chemistry: Secondary | ICD-10-CM

## 2018-03-14 DIAGNOSIS — R61 Generalized hyperhidrosis: Secondary | ICD-10-CM

## 2018-03-14 DIAGNOSIS — L249 Irritant contact dermatitis, unspecified cause: Secondary | ICD-10-CM | POA: Diagnosis not present

## 2018-03-14 MED ORDER — GABAPENTIN 100 MG PO CAPS: 200.0000 mg | ORAL_CAPSULE | Freq: Three times a day (TID) | ORAL | 3 refills | Status: DC

## 2018-03-14 MED ORDER — TRIAMCINOLONE ACETONIDE 0.5 % EX CREA: 1.0000 "application " | TOPICAL_CREAM | Freq: Two times a day (BID) | CUTANEOUS | 1 refills | Status: DC

## 2018-03-14 NOTE — Patient Instructions (Signed)
     IF you received an x-ray today, you will receive an invoice from [facility]. Please contact [facility] at [phone] with questions or concerns regarding your invoice.   IF you received labwork today, you will receive an invoice from [facility]. Please contact [facility] at [phone] with questions or concerns regarding your invoice.   Our billing staff will not be able to assist you with questions regarding bills from these companies.  You will be contacted with the lab results as soon as they are available. The fastest way to get your results is to activate your My Chart account. Instructions are located on the last page of this paperwork. If you have not heard from us regarding the results in 2 weeks, please contact this office.     

## 2018-03-14 NOTE — Telephone Encounter (Signed)
Copied from [place]. Topic: Quick Communication - See Telephone Encounter >> Mar 14, 2018  4:54 PM Bea Graff, NT wrote: CRM for notification. See Telephone encounter for: 03/14/18. Marie from [facility] on [address] calling to get clarification on how the doctor would like this pt to take the gabapentin (NEURONTIN) 100 MG. It was sent over with 2 different directions. Please advise. CB#: [phone]

## 2018-03-14 NOTE — Progress Notes (Signed)
3/22/20192:34 PM  Judith [name] 1941-07-20, 77 y.o. female [id number]  Chief Complaint  Patient presents with  . sinusitis/polyarthritis    4 week f/u    HPI:   Patient is a 77 y.o. female with past medical history significant for DM2 insulin dependent with neuropathy, severe gastroparesis with h/o partial gastrectomy due to ulcers, among others who presents today for routine fu.   Recently treated for sinusitis, completed abx, doing well in that regarids  1. DM - lantus 24u, humalog 5 units + 1/25/150. Checks cbgs TID. Brings in her log. Low of 75 x 1. Fasting this am 163. avg past 7 days is 194. cbgs higher during the weekends, family tends to eat out. She sees endo in may.  2. Gastroparesis - she is doing better with diet. Eating smaller portions, however struggles with weight gain. She does drink supplement once or twice a day. She denies any abd pain, nausea, vomiting.   3. Polyneuropathy from DM - used to be on lyrica but too expensive, now on gabapentin, 168m AM , 1095mafternoon and 30040mt bedtime. Tolerating well. Daytime symptoms controlled but not bedtime. Reports constant pins and needles feeling in her feet when trying to sleep.   4. Polyarthritis with + ANA and anti RNP ab - reports having flare up of her wrists, having problems opening jars and containers but sees rheum this coming Thursday  5. Patient continues to have night sweats, wakes up drenched. Has not checked sugar during these episodes. Denies any dizziness, shakiness, confusion during these episodes. Happens most nights.   6. Rash along waistline slightly better, has been using triamcinolone, has started using pads instead of depends.  7. Living arrangements - [place] ago[place], they are starting to think about assisted living. She is starting to not use the walker inside the home but still uses in public as she tires and  feels better supported.   8. Due for labs to follow-up on iron deficiency anemia - tolerating iron supplements well. Normal colonoscopy in 2019. Also needs monitoring of LFTs, decided not have GB removed. Also thoughts were that might have been related to high APAP use as patient cant do NSAIDs and was having so much joint pain.  [facility] 03/14/2018 02/27/2018 02/18/2018  Decreased Interest 0 0 0  Down, Depressed, Hopeless 0 0 0  PHQ - 2 Score 0 0 0  Altered sleeping - - -  Tired, decreased energy - - -  Change in appetite - - -  Feeling bad or failure about yourself  - - -  Trouble concentrating - - -  Moving slowly or fidgety/restless - - -  Suicidal thoughts - - -  PHQ-9 Score - - -  Difficult doing work/chores - - -    Allergies  Allergen Reactions  . Asa [Aspirin]   . Sulfa Antibiotics     Prior to Admission medications   Medication Sig Start Date End Date Taking? Authorizing Provider  Biotin 1000 MCG tablet Take 1,000 mcg by mouth daily.   Yes [provider]  blood glucose meter kit and supplies KIT Dispense based on patient and insurance preference. Check capillary glucose three times a day, before meals.  Dx E11.65, Z79.4 10/29/17  Yes [place] GuysD  carvedilol (COREG) 12.5 MG tablet TAKE 1 TABLET (12.5 MG TOTAL) 2 (TWO) TIMES DAILY WITH A MEAL BY MOUTH. 01/27/18  Yes Rutherford Guys, MD  cyclobenzaprine (FLEXERIL) 5 MG tablet Take 1 tablet (5 mg total) by mouth 3 (three) times daily as needed for muscle spasms. 11/28/17  Yes Rutherford Guys, MD  diclofenac sodium (VOLTAREN) 1 % GEL Apply 2 g topically 4 (four) times daily. 01/24/18  Yes Rutherford Guys, MD  DULoxetine (CYMBALTA) 60 MG capsule TAKE 1 CAPSULE (60 MG TOTAL) DAILY BY MOUTH. 01/27/18  Yes Rutherford Guys, MD  ferrous sulfate 325 (65 FE) MG tablet TAKE 1 TABLET BY MOUTH EVERY DAY WITH BREAKFAST 02/26/18  Yes Rutherford Guys, MD  gabapentin (NEURONTIN) 100 MG capsule Take 1 capsule in AM  and afternoon and take 3 capsules at bedtime 11/28/17  Yes Rutherford Guys, MD  glucose blood (ONE TOUCH ULTRA TEST) test strip USE TO CHECK GLUCOSE LEVELS 3 TIMES A DAY, DX E11.9, Z79.4 11/30/17  Yes Rutherford Guys, MD  hydrochlorothiazide (HYDRODIURIL) 25 MG tablet Take 0.5 tablets (12.5 mg total) by mouth daily. 01/24/18  Yes Rutherford Guys, MD  Insulin Glargine (LANTUS SOLOSTAR) 100 UNIT/ML Solostar Pen Inject 26 Units into the skin daily at 10 pm. Patient taking differently: Inject 24 Units into the skin daily at 10 pm.  01/24/18  Yes Rutherford Guys, MD  Insulin Lispro (HUMALOG PEN [place]) Inject 1 Dose into the skin 3 (three) times daily. With every meal up to 3 times per day   Yes [provider]  Insulin Pen Needle (B-D ULTRAFINE III SHORT PEN) 31G X 8 MM MISC To use as directed with insulin, four times a day. Dx E11.9, Z79.4 11/08/17  Yes Rutherford Guys, MD  losartan (COZAAR) 25 MG tablet TAKE 1 TABLET BY MOUTH EVERY DAY 01/27/18  Yes Rutherford Guys, MD  Multiple Vitamin (MULTIVITAMIN) tablet Take 1 tablet daily by mouth.   Yes [provider]  pantoprazole (PROTONIX) 40 MG tablet TAKE 1 TABLET (40 MG TOTAL) DAILY BY MOUTH. 01/27/18  Yes Rutherford Guys, MD  ranitidine (ZANTAC) 150 MG capsule Take 1 capsule (150 mg total) at bedtime by mouth. 10/30/17  Yes Rutherford Guys, MD  triamcinolone cream (KENALOG) 0.5 % Apply 1 application topically 2 (two) times daily. 03/10/18  Yes Rutherford Guys, MD  diphenhydrAMINE-Phenylephrine 25-10 MG TABS Take by mouth.    [provider]    Past Medical History:  Diagnosis Date  . Anemia   . Arthritis   . Biliary gastritis   . Blood transfusion without reported diagnosis    2007  . Depression   . Diabetes mellitus type 2 with complications ([place])   . Duodenal ulcer disease   . Encounter for long-term (current) use of insulin ([place])   . Gastric ulcer   . Gastroparesis    severe  . GERD (gastroesophageal reflux disease)     . Gout    tested for gout but was not  . Heart murmur   . Hyperlipidemia    in past not now  . Hypertension   . Hypokalemia, gastrointestinal losses    secondary to severe gastroparesis  . Osteoporosis    just had bone density test possible   . Peripheral neuropathy   . Presence of cardiac pacemaker   . S/P partial gastrectomy    due to severe gastric and gudoenal ulcers  . Seasonal allergies   . Sleep apnea    could not use CPAP  . UTI (urinary tract infection)     Past Surgical History:  Procedure Laterality Date  . COLONOSCOPY  02/11/2018   no polyps, + small internal hemorrhoids  . EUS N/A 12/12/2017   Procedure: UPPER ENDOSCOPIC ULTRASOUND (EUS) RADIAL;  Surgeon: [name], MD;  Location: [facility];  Service: Endoscopy;  Laterality: N/A;  . GASTRECTOMY    . JOINT REPLACEMENT    . pace maker    . PARTIAL GASTRECTOMY    . TONSILLECTOMY     45 years ago  . UPPER GASTROINTESTINAL ENDOSCOPY      Social History   Tobacco Use  . Smoking status: Never Smoker  . Smokeless tobacco: Never Used  Substance Use Topics  . Alcohol use: No    Alcohol/week: 0.0 oz    Family History  Problem Relation Age of Onset  . Diabetes Mother   . Diabetes Father   . Heart disease Father   . Diabetes Sister   . Heart disease Son   . Colon cancer Neg Hx   . Colon polyps Neg Hx   . Rectal cancer Neg Hx   . Stomach cancer Neg Hx   . Esophageal cancer Neg Hx     ROS Neg for fever, chills, cough, SOB, CP, palpitations, nausea, vomiting, abd pain, polydipsia Pos for night sweats, rash, joint pain with swelling and redness  OBJECTIVE:  Blood pressure 112/64, pulse 77, temperature 98 F (36.7 C), temperature source Oral, resp. rate 16, height 5' (1.524 m), weight 105 lb 9.6 oz (47.9 kg), SpO2 99 %.  Wt Readings from Last 3 Encounters:  03/14/18 105 lb 9.6 oz (47.9 kg)  02/27/18 103 lb 3.2 oz (46.8 kg)  02/18/18 103 lb 9.6 oz (47 kg)    Physical Exam   AAOx3, NAD,  fraile, uses 4Wwalker with seat [place]/AT, EOMI, PEERLA, MMM Neck is supple, no thyromegaly noted CTAB no w/r/r RRR nl s1/s2, no m/r/g No peripheral edema noted Improved macular papular rash along mostly posterior waistline, some areas of excoriations noted.   ASSESSMENT and PLAN  1. Diabetic polyneuropathy associated with type 2 diabetes mellitus (HCC) Overall cbgs acceptable. On duloxetine and gabapentin for neuropathy. Discussed increasing gabapentin. Sees endo in may for DM.   2. Gastroparesis Severe, currently asymptomatic, discussed importance of following diet. Follow-up with GI as scheduled. Patient currently not on a pro-motility agent.   3. Irritant contact dermatitis, unspecified trigger Discussed continued supportive measures, itch-scratch cycle and risks with prolonged topical steroid use.  4. Night sweats Might be from unrecognized hypoglycemia, advised checking cbgs when awoken by these episodes and if indeed low, change lantus from bedtime to morning use.   5. Elevated liver function tests - Comprehensive metabolic panel  6. Other iron deficiency anemia - CBC  7. Polyarthritis Keep upcoming appt with rheum  Other orders - triamcinolone cream (KENALOG) 0.5 %; Apply 1 application topically 2 (two) times daily.  Return in about 3 months (around 06/14/2018).    Rutherford Guys, MD [facility] [place], [place] [address] Ph.  [phone] Fax [phone]

## 2018-03-15 ENCOUNTER — Encounter: Payer: Self-pay | Admitting: Family Medicine

## 2018-03-15 LAB — CBC
Hematocrit: 34 % (ref 34.0–46.6)
Hemoglobin: 10.9 g/dL — ABNORMAL LOW (ref 11.1–15.9)
MCH: 25.6 pg — ABNORMAL LOW (ref 26.6–33.0)
MCHC: 32.1 g/dL (ref 31.5–35.7)
MCV: 80 fL (ref 79–97)
Platelets: 461 10*3/uL — ABNORMAL HIGH (ref 150–379)
RBC: 4.26 x10E6/uL (ref 3.77–5.28)
RDW: 21 % — ABNORMAL HIGH (ref 12.3–15.4)
WBC: 10.1 10*3/uL (ref 3.4–10.8)

## 2018-03-15 LAB — COMPREHENSIVE METABOLIC PANEL
ALT: 12 IU/L (ref 0–32)
AST: 32 IU/L (ref 0–40)
Albumin/Globulin Ratio: 1.1 — ABNORMAL LOW (ref 1.2–2.2)
Albumin: 3.7 g/dL (ref 3.5–4.8)
Alkaline Phosphatase: 94 IU/L (ref 39–117)
BUN/Creatinine Ratio: 48 — ABNORMAL HIGH (ref 12–28)
BUN: 31 mg/dL — ABNORMAL HIGH (ref 8–27)
Bilirubin Total: <0.2 mg/dL (ref 0.0–1.2)
CO2: 23 mmol/L (ref 20–29)
Calcium: 9.2 mg/dL (ref 8.7–10.3)
Chloride: 95 mmol/L — ABNORMAL LOW (ref 96–106)
Creatinine, Ser: 0.64 mg/dL (ref 0.57–1.00)
GFR calc Af Amer: 99 mL/min/{1.73_m2} (ref >59)
GFR calc non Af Amer: 86 mL/min/{1.73_m2} (ref >59)
Globulin, Total: 3.4 g/dL (ref 1.5–4.5)
Glucose: 78 mg/dL (ref 65–99)
Potassium: 4.1 mmol/L (ref 3.5–5.2)
Sodium: 136 mmol/L (ref 134–144)
Total Protein: 7.1 g/dL (ref 6.0–8.5)

## 2018-03-18 MED ORDER — GABAPENTIN 100 MG PO CAPS: 200.0000 mg | ORAL_CAPSULE | Freq: Three times a day (TID) | ORAL | 3 refills | Status: DC

## 2018-03-18 NOTE — Addendum Note (Signed)
Addended by: Rutherford Guys on: 03/18/2018 01:43 PM   Modules accepted: Orders

## 2018-03-18 NOTE — Telephone Encounter (Signed)
Sent corrected prescription to [facility]

## 2018-03-20 ENCOUNTER — Telehealth: Payer: Self-pay

## 2018-03-20 DIAGNOSIS — M25531 Pain in right wrist: Secondary | ICD-10-CM | POA: Diagnosis not present

## 2018-03-20 DIAGNOSIS — K219 Gastro-esophageal reflux disease without esophagitis: Secondary | ICD-10-CM | POA: Diagnosis not present

## 2018-03-20 DIAGNOSIS — E119 Type 2 diabetes mellitus without complications: Secondary | ICD-10-CM | POA: Diagnosis not present

## 2018-03-20 DIAGNOSIS — M359 Systemic involvement of connective tissue, unspecified: Secondary | ICD-10-CM | POA: Diagnosis not present

## 2018-03-20 DIAGNOSIS — M25572 Pain in left ankle and joints of left foot: Secondary | ICD-10-CM | POA: Diagnosis not present

## 2018-03-20 DIAGNOSIS — R21 Rash and other nonspecific skin eruption: Secondary | ICD-10-CM | POA: Diagnosis not present

## 2018-03-20 DIAGNOSIS — R768 Other specified abnormal immunological findings in serum: Secondary | ICD-10-CM | POA: Diagnosis not present

## 2018-03-20 DIAGNOSIS — M79641 Pain in right hand: Secondary | ICD-10-CM | POA: Diagnosis not present

## 2018-03-20 DIAGNOSIS — D8989 Other specified disorders involving the immune mechanism, not elsewhere classified: Secondary | ICD-10-CM | POA: Diagnosis not present

## 2018-03-20 DIAGNOSIS — M1189 Other specified crystal arthropathies, multiple sites: Secondary | ICD-10-CM | POA: Diagnosis not present

## 2018-03-20 DIAGNOSIS — M19042 Primary osteoarthritis, left hand: Secondary | ICD-10-CM | POA: Diagnosis not present

## 2018-03-20 DIAGNOSIS — M1990 Unspecified osteoarthritis, unspecified site: Secondary | ICD-10-CM | POA: Diagnosis not present

## 2018-03-20 DIAGNOSIS — M79642 Pain in left hand: Secondary | ICD-10-CM | POA: Diagnosis not present

## 2018-03-20 DIAGNOSIS — M25431 Effusion, right wrist: Secondary | ICD-10-CM | POA: Diagnosis not present

## 2018-03-20 DIAGNOSIS — M19041 Primary osteoarthritis, right hand: Secondary | ICD-10-CM | POA: Diagnosis not present

## 2018-03-22 ENCOUNTER — Encounter: Payer: Self-pay | Admitting: Family Medicine

## 2018-03-25 ENCOUNTER — Other Ambulatory Visit: Payer: Self-pay | Admitting: Family Medicine

## 2018-03-26 ENCOUNTER — Encounter: Payer: Self-pay | Admitting: Family Medicine

## 2018-03-31 ENCOUNTER — Encounter: Payer: Self-pay | Admitting: Family Medicine

## 2018-03-31 LAB — CUP PACEART INCLINIC DEVICE CHECK
Implantable Lead Implant Date: 20070701
Implantable Lead Model: 4136
Implantable Pulse Generator Implant Date: 20110617
MDC IDC LEAD IMPLANT DT: 20110617
MDC IDC LEAD LOCATION: 753859
MDC IDC LEAD LOCATION: 753860
MDC IDC LEAD SERIAL: 28708376
MDC IDC LEAD SERIAL: 484167
MDC IDC SESS DTM: 20190408100027
Pulse Gen Serial Number: 157425

## 2018-04-01 ENCOUNTER — Emergency Department (HOSPITAL_COMMUNITY)
Admission: EM | Admit: 2018-04-01 | Discharge: 2018-04-02 | Disposition: A | Discharge status: Home / Self Care | Payer: Medicare Other | Attending: Emergency Medicine | Admitting: Emergency Medicine

## 2018-04-01 ENCOUNTER — Ambulatory Visit: Payer: Self-pay

## 2018-04-01 ENCOUNTER — Encounter (HOSPITAL_COMMUNITY): Payer: Self-pay

## 2018-04-01 ENCOUNTER — Other Ambulatory Visit: Payer: Self-pay

## 2018-04-01 DIAGNOSIS — Z794 Long term (current) use of insulin: Secondary | ICD-10-CM | POA: Diagnosis not present

## 2018-04-01 DIAGNOSIS — M545 Low back pain, unspecified: Secondary | ICD-10-CM

## 2018-04-01 DIAGNOSIS — E119 Type 2 diabetes mellitus without complications: Secondary | ICD-10-CM | POA: Diagnosis not present

## 2018-04-01 DIAGNOSIS — Z79899 Other long term (current) drug therapy: Secondary | ICD-10-CM | POA: Diagnosis not present

## 2018-04-01 DIAGNOSIS — R531 Weakness: Secondary | ICD-10-CM | POA: Diagnosis not present

## 2018-04-01 DIAGNOSIS — M549 Dorsalgia, unspecified: Secondary | ICD-10-CM | POA: Diagnosis not present

## 2018-04-01 DIAGNOSIS — I10 Essential (primary) hypertension: Secondary | ICD-10-CM | POA: Insufficient documentation

## 2018-04-01 DIAGNOSIS — M546 Pain in thoracic spine: Secondary | ICD-10-CM | POA: Diagnosis not present

## 2018-04-01 LAB — BASIC METABOLIC PANEL
Anion gap: 15 (ref 5–15)
BUN: 38 mg/dL — ABNORMAL HIGH (ref 6–20)
CALCIUM: 9.3 mg/dL (ref 8.9–10.3)
CO2: 20 mmol/L — AB (ref 22–32)
CREATININE: 0.79 mg/dL (ref 0.44–1.00)
Chloride: 96 mmol/L — ABNORMAL LOW (ref 101–111)
GLUCOSE: 106 mg/dL — AB (ref 65–99)
Potassium: 3.6 mmol/L (ref 3.5–5.1)
Sodium: 131 mmol/L — ABNORMAL LOW (ref 135–145)

## 2018-04-01 LAB — CBC
HCT: 36.6 % (ref 36.0–46.0)
Hemoglobin: 11.9 g/dL — ABNORMAL LOW (ref 12.0–15.0)
MCH: 27.3 pg (ref 26.0–34.0)
MCHC: 32.5 g/dL (ref 30.0–36.0)
MCV: 83.9 fL (ref 78.0–100.0)
PLATELETS: 303 10*3/uL (ref 150–400)
RBC: 4.36 MIL/uL (ref 3.87–5.11)
RDW: 18.4 % — AB (ref 11.5–15.5)
WBC: 16.5 10*3/uL — ABNORMAL HIGH (ref 4.0–10.5)

## 2018-04-01 LAB — URINALYSIS, ROUTINE W REFLEX MICROSCOPIC
BILIRUBIN URINE: NEGATIVE
Glucose, UA: NEGATIVE mg/dL
HGB URINE DIPSTICK: NEGATIVE
KETONES UR: NEGATIVE mg/dL
Leukocytes, UA: NEGATIVE
Nitrite: NEGATIVE
PROTEIN: NEGATIVE mg/dL
Specific Gravity, Urine: 1.015 (ref 1.005–1.030)
pH: 5 (ref 5.0–8.0)

## 2018-04-01 LAB — CBG MONITORING, ED: GLUCOSE-CAPILLARY: 96 mg/dL (ref 65–99)

## 2018-04-01 LAB — I-STAT TROPONIN, ED: Troponin i, poc: 0 ng/mL (ref 0.00–0.08)

## 2018-04-01 NOTE — Telephone Encounter (Signed)
Patient's daughter called and says "over the past 2 days, she has gotten weaker. She walks with a walker and was referred to a rheumatologist for her wrist. Yesterday, I noticed she was walking slower and I asked was she ok and she says her hips hurt. She said last night she could barely get out of the bed to go to the bathroom. Today she yelled for me and said she couldn't get out of the bed. I helped her and she was able to move to a chair. Then, she wasn't able to get up. This is not like her. I called to see if I should go ahead and take her to the ER to get checked out, because I would hate for her to fall trying to move around when I'm not here." I advised that would be the best thing at this point since she is not able to get up from the chair or even the bed. The daughter verbalized understanding and says she will drive her, if she can get her up and into the car.   Reason for Disposition . Health Information question, no triage required and triager able to answer question  Answer Assessment - Initial Assessment Questions 1. REASON FOR CALL or QUESTION: "What is your reason for calling today?" or "How can I best help you?" or "What question do you have that I can help answer?"     Weakness, can hardly walk, worse over past 2 days  Protocols used: INFORMATION ONLY CALL-A-AH

## 2018-04-01 NOTE — ED Triage Notes (Signed)
Pt BIB daughter for eval of progressively worsening weakness x 3 days. Pt walks w/ walker at baseline but reports she is unable to get up ambulate normally and has increasing generalized pain. Hx of arthritis and anemia. Noted pallor in triage, which daughter reports is baseline skin tone

## 2018-04-02 ENCOUNTER — Emergency Department (HOSPITAL_COMMUNITY): Payer: Medicare Other

## 2018-04-02 ENCOUNTER — Telehealth: Payer: Self-pay | Admitting: Family Medicine

## 2018-04-02 DIAGNOSIS — E0865 Diabetes mellitus due to underlying condition with hyperglycemia: Secondary | ICD-10-CM | POA: Diagnosis not present

## 2018-04-02 DIAGNOSIS — R748 Abnormal levels of other serum enzymes: Secondary | ICD-10-CM | POA: Diagnosis not present

## 2018-04-02 DIAGNOSIS — M549 Dorsalgia, unspecified: Secondary | ICD-10-CM | POA: Diagnosis not present

## 2018-04-02 DIAGNOSIS — K3184 Gastroparesis: Secondary | ICD-10-CM | POA: Diagnosis not present

## 2018-04-02 DIAGNOSIS — M545 Low back pain: Secondary | ICD-10-CM | POA: Diagnosis not present

## 2018-04-02 DIAGNOSIS — R531 Weakness: Secondary | ICD-10-CM | POA: Diagnosis not present

## 2018-04-02 DIAGNOSIS — M546 Pain in thoracic spine: Secondary | ICD-10-CM | POA: Diagnosis not present

## 2018-04-02 DIAGNOSIS — E0841 Diabetes mellitus due to underlying condition with diabetic mononeuropathy: Secondary | ICD-10-CM | POA: Diagnosis not present

## 2018-04-02 LAB — C-REACTIVE PROTEIN: CRP: 5.7 mg/dL — ABNORMAL HIGH (ref <1.0)

## 2018-04-02 LAB — I-STAT TROPONIN, ED: Troponin i, poc: 0 ng/mL (ref 0.00–0.08)

## 2018-04-02 LAB — SEDIMENTATION RATE: SED RATE: 48 mm/h — AB (ref 0–22)

## 2018-04-02 LAB — CK: CK TOTAL: 22 U/L — AB (ref 38–234)

## 2018-04-02 MED ORDER — FENTANYL CITRATE (PF) 100 MCG/2ML IJ SOLN
50.0000 ug | Freq: Once | INTRAMUSCULAR | Status: AC
  Administered 2018-04-02: 50 ug via INTRAVENOUS
  Filled 2018-04-02: qty 2

## 2018-04-02 MED ORDER — HYDROCODONE-ACETAMINOPHEN 5-325 MG PO TABS: 1.0000 | ORAL_TABLET | Freq: Four times a day (QID) | ORAL | 0 refills | Status: DC | PRN

## 2018-04-02 MED ORDER — MORPHINE SULFATE (PF) 4 MG/ML IV SOLN
4.0000 mg | Freq: Once | INTRAVENOUS | Status: AC
Start: 2018-04-02 — End: 2018-04-02
  Administered 2018-04-02: 4 mg via INTRAVENOUS
  Filled 2018-04-02: qty 1

## 2018-04-02 MED ORDER — CELECOXIB 100 MG PO CAPS: 100.0000 mg | ORAL_CAPSULE | Freq: Two times a day (BID) | ORAL | 0 refills | Status: DC

## 2018-04-02 NOTE — Telephone Encounter (Signed)
Patient's daughter called yesterday with patient fatigue and weakness. They took her into ED and they treated her for pain. Patient is needing help to get up and down. Patient has addiction problems and damage to the kidney. Daughter is concerned about how to manage her pain. She is also afraid her mother is going to fall. Patient has appointment with diabetic counciling today and may be able to see Rheumatologist on Thursday.  [name] is going to treat her as she has strained her back and shoulder- with the pain medication prescribed and massage and heat and see if she sees any improvement. If she does not see improvement- she will call for assessment.

## 2018-04-02 NOTE — Discharge Instructions (Addendum)
You were seen today for weakness and back pain.  Your workup is largely reassuring.  You do have a spot on your kidney that you need to have evaluated with a contrasted CT scan.  Follow-up with your primary physician to have this done as an outpatient.  This is likely not related to your current symptoms.  Your CT scans do not show any fractures or other abnormality.  Your urinalysis does not show any evidence of infection.  Take pain medication as prescribed.  Be careful as this can make you drowsy.

## 2018-04-02 NOTE — ED Provider Notes (Addendum)
[facility] EMERGENCY DEPARTMENT Provider Note   CSN: [id number] Arrival date & time: 04/01/18  1910     History   Chief Complaint Chief Complaint  Patient presents with  . Weakness    HPI Judith [name] is a 76 y.o. female.  HPI  This is a 77 year old female with a history of diabetes, gastroparesis, gout, hypertension, hyperlipidemia, hypokalemia who presents with generalized weakness.  Daughter is at bedside and helps provide history.  Patient reports generalized weakness.  She states that she has developed pain in her back and bilateral shoulders that has worsened since Sunday.  Daughter reports that on Saturday she was walking independently and doing her ADLs.  However, on Sunday noted to have difficulty even cutting her food.  She was unable to get out of bed this morning.  She reports it is painful to sit up mostly in her lower lumbar spine.  She denies any injury.  She denies any numbness or tingling of the lower extremities.  She denies any focal weakness.  She denies any bowel or bladder difficulties.  No fevers or recent illnesses.  Currently she rates her pain at 10 out of 10.  She took ibuprofen today with no relief.  She denies any dysuria or hematuria.  Past Medical History:  Diagnosis Date  . Anemia   . Arthritis   . Biliary gastritis   . Blood transfusion without reported diagnosis    2007  . Depression   . Diabetes mellitus type 2 with complications ([place])   . Duodenal ulcer disease   . Encounter for long-term (current) use of insulin ([place])   . Gastric ulcer   . Gastroparesis    severe  . GERD (gastroesophageal reflux disease)   . Gout    tested for gout but was not  . Heart murmur   . Hyperlipidemia    in past not now  . Hypertension   . Hypokalemia, gastrointestinal losses    secondary to severe gastroparesis  . Osteoporosis    just had bone density test possible   . Peripheral neuropathy   . Presence of cardiac pacemaker   . S/P  partial gastrectomy    due to severe gastric and gudoenal ulcers  . Seasonal allergies   . Sleep apnea    could not use CPAP  . UTI (urinary tract infection)     Patient Active Problem List   Diagnosis Date Noted  . Acute blood loss anemia 01/17/2018  . Gallstones 01/17/2018  . Weight loss, unintentional 01/17/2018  . Dilated bile duct   . Elevated liver function tests   . Gastritis and gastroduodenitis   . Complete heart block ([place]) 11/09/2017  . S/P partial gastrectomy   . Peripheral neuropathy   . Hypertension   . Gastroparesis   . Duodenal ulcer disease   . Diabetes mellitus type 2 with complications ([place])   . Depression   . Biliary gastritis   . Seasonal allergies   . Presence of cardiac pacemaker   . Gout   . Cervical spine arthritis 04/05/2016  . Long-term insulin use in type 2 diabetes ([place]) 04/05/2016    Past Surgical History:  Procedure Laterality Date  . COLONOSCOPY  02/11/2018   no polyps, + small internal hemorrhoids  . EUS N/A 12/12/2017   Procedure: UPPER ENDOSCOPIC ULTRASOUND (EUS) RADIAL;  Surgeon: [name], MD;  Location: [facility] ENDOSCOPY;  Service: Endoscopy;  Laterality: N/A;  . GASTRECTOMY    . JOINT REPLACEMENT    .  pace Agricultural engineer    . PARTIAL GASTRECTOMY    . TONSILLECTOMY     45 years ago  . UPPER GASTROINTESTINAL ENDOSCOPY       OB History   None      Home Medications    Prior to Admission medications   Medication Sig Start Date End Date Taking? Authorizing Provider  Biotin 1000 MCG tablet Take 1,000 mcg by mouth daily.   Yes [provider]  carvedilol (COREG) 12.5 MG tablet TAKE 1 TABLET (12.5 MG TOTAL) 2 (TWO) TIMES DAILY WITH A MEAL BY MOUTH. 01/27/18  Yes Rutherford Guys, MD  DULoxetine (CYMBALTA) 60 MG capsule TAKE 1 CAPSULE (60 MG TOTAL) DAILY BY MOUTH. 01/27/18  Yes Rutherford Guys, MD  ferrous sulfate 325 (65 FE) MG tablet TAKE 1 TABLET BY MOUTH EVERY DAY WITH BREAKFAST 02/26/18  Yes Rutherford Guys, MD  gabapentin  (NEURONTIN) 100 MG capsule Take 2 capsules (200 mg total) by mouth 3 (three) times daily. Patient taking differently: Take 100-400 mg by mouth See admin instructions. Take 1 capsule every morning and at lunch then take 4 capsules at bedtime 03/18/18  Yes Rutherford Guys, MD  hydrochlorothiazide (HYDRODIURIL) 25 MG tablet Take 0.5 tablets (12.5 mg total) by mouth daily. 01/24/18  Yes Rutherford Guys, MD  Insulin Glargine (LANTUS SOLOSTAR) 100 UNIT/ML Solostar Pen Inject 26 Units into the skin daily at 10 pm. Patient taking differently: Inject 24 Units into the skin daily.  01/24/18  Yes Rutherford Guys, MD  insulin lispro (HUMALOG) 100 UNIT/ML injection Inject 1-10 Units into the skin 3 (three) times daily before meals. Sliding scale   Yes [provider]  losartan (COZAAR) 25 MG tablet TAKE 1 TABLET BY MOUTH EVERY DAY 01/27/18  Yes Rutherford Guys, MD  Multiple Vitamin (MULTIVITAMIN) tablet Take 1 tablet daily by mouth.   Yes [provider]  pantoprazole (PROTONIX) 40 MG tablet TAKE 1 TABLET (40 MG TOTAL) DAILY BY MOUTH. 01/27/18  Yes Rutherford Guys, MD  ranitidine (ZANTAC) 150 MG capsule Take 1 capsule (150 mg total) at bedtime by mouth. 10/30/17  Yes Rutherford Guys, MD  blood glucose meter kit and supplies KIT Dispense based on patient and insurance preference. Check capillary glucose three times a day, before meals.  Dx E11.65, Z79.4 10/29/17   Rutherford Guys, MD  cyclobenzaprine (FLEXERIL) 5 MG tablet Take 1 tablet (5 mg total) by mouth 3 (three) times daily as needed for muscle spasms. Patient not taking: Reported on 04/02/2018 11/28/17   Rutherford Guys, MD  diclofenac sodium (VOLTAREN) 1 % GEL Apply 2 g topically 4 (four) times daily. Patient not taking: Reported on 04/02/2018 01/24/18   Rutherford Guys, MD  glucose blood (ONE TOUCH ULTRA TEST) test strip USE TO CHECK GLUCOSE LEVELS 3 TIMES A DAY, DX E11.9, Z79.4 11/30/17   Rutherford Guys, MD  HYDROcodone-acetaminophen  (NORCO/VICODIN) 5-325 MG tablet Take 1 tablet by mouth every 6 (six) hours as needed. 04/02/18   Keyetta Hollingworth, Barbette Hair, MD  Insulin Pen Needle (B-D ULTRAFINE III SHORT PEN) 31G X 8 MM MISC To use as directed with insulin, four times a day. Dx E11.9, Z79.4 11/08/17   Rutherford Guys, MD  triamcinolone cream (KENALOG) 0.5 % Apply 1 application topically 2 (two) times daily. Patient not taking: Reported on 04/02/2018 03/14/18   Rutherford Guys, MD    Family History Family History  Problem Relation Age of Onset  . Diabetes  Mother   . Diabetes Father   . Heart disease Father   . Diabetes Sister   . Heart disease Son   . Colon cancer Neg Hx   . Colon polyps Neg Hx   . Rectal cancer Neg Hx   . Stomach cancer Neg Hx   . Esophageal cancer Neg Hx     Social History Social History   Tobacco Use  . Smoking status: Never Smoker  . Smokeless tobacco: Never Used  Substance Use Topics  . Alcohol use: No    Alcohol/week: 0.0 oz  . Drug use: No     Allergies   Asa [aspirin] and Sulfa antibiotics   Review of Systems Review of Systems  Constitutional: Negative for fever.  HENT: Negative for congestion.   Respiratory: Negative for shortness of breath.   Cardiovascular: Negative for chest pain.  Gastrointestinal: Negative for abdominal pain, nausea and vomiting.  Genitourinary: Negative for dysuria and hematuria.  Musculoskeletal: Positive for back pain and myalgias. Negative for neck pain.  Neurological: Negative for weakness and headaches.  All other systems reviewed and are negative.    Physical Exam Updated Vital Signs BP (!) 119/46   Pulse 66   Temp 97.9 F (36.6 C)   Resp 12   Ht 5' (1.524 m)   Wt 47.6 kg (105 lb)   SpO2 93%   BMI 20.51 kg/m   Physical Exam  Constitutional: She is oriented to person, place, and time.  Chronically ill-appearing, no acute distress  HENT:  Head: Normocephalic and atraumatic.  Cardiovascular: Normal rate, regular rhythm and normal heart  sounds.  Pulmonary/Chest: Effort normal and breath sounds normal. No respiratory distress. She has no wheezes.  pacemaker palpable  Abdominal: Soft. Bowel sounds are normal. There is no tenderness.  Musculoskeletal:  Tenderness to palpation lower lumbar spine without step-off or deformity  Neurological: She is alert and oriented to person, place, and time.  Radial nerves II through XII intact, 4+ out of 5 strength in all 4 extremities including grip strength, biceps, triceps, deltoids, hip flexors, dorsi and plantar flexion, patient needs assistance to sit on the side of the bed this appears secondary to pain.  She is able to bear weight.  Skin: Skin is warm and dry.  Psychiatric: She has a normal mood and affect.  Nursing note and vitals reviewed.    ED Treatments / Results  Labs (all labs ordered are listed, but only abnormal results are displayed) Labs Reviewed  BASIC METABOLIC PANEL - Abnormal; Notable for the following components:      Result Value   Sodium 131 (*)    Chloride 96 (*)    CO2 20 (*)    Glucose, Bld 106 (*)    BUN 38 (*)    All other components within normal limits  CBC - Abnormal; Notable for the following components:   WBC 16.5 (*)    Hemoglobin 11.9 (*)    RDW 18.4 (*)    All other components within normal limits  CK - Abnormal; Notable for the following components:   Total CK 22 (*)    All other components within normal limits  C-REACTIVE PROTEIN - Abnormal; Notable for the following components:   CRP 5.7 (*)    All other components within normal limits  SEDIMENTATION RATE - Abnormal; Notable for the following components:   Sed Rate 48 (*)    All other components within normal limits  URINALYSIS, ROUTINE W REFLEX MICROSCOPIC  CBG MONITORING, ED  I-STAT TROPONIN, ED  I-STAT TROPONIN, ED    EKG None   ED ECG REPORT   Date: 04/14/2018  Rate: 77  Rhythm: normal sinus rhythm  QRS Axis: normal  Intervals: PR prolonged  ST/T Wave abnormalities:  normal  Conduction Disutrbances:nonspecific intraventricular conduction delay  Narrative Interpretation:   Old EKG Reviewed: none available  I have personally reviewed the EKG tracing and agree with the computerized printout as noted.  Radiology Dg Thoracic Spine W/swimmers  Result Date: 04/02/2018 CLINICAL DATA:  Initial evaluation for acute back pain.  No injury. EXAM: THORACIC SPINE - 3 VIEWS COMPARISON:  None. FINDINGS: Vertebral bodies normally aligned with preservation of the normal thoracic kyphosis. Vertebral body heights well maintained. No evidence for acute fracture or malalignment. No acute soft tissue abnormality. Calcifications overlying the right upper quadrant likely reflect cholelithiasis. Aortic atherosclerosis. Cardiac pacemaker electrodes partially visualized. Bilateral shoulder arthroplasties noted. IMPRESSION: 1. No radiographic evidence for acute abnormality within the thoracic spine. 2. Aortic atherosclerosis. 3. Cholelithiasis. Electronically Signed   By: Jeannine Boga M.D.   On: 04/02/2018 02:02   Dg Lumbar Spine Complete  Result Date: 04/02/2018 CLINICAL DATA:  Initial evaluation for acute back pain, no injury. EXAM: LUMBAR SPINE - COMPLETE 4+ VIEW COMPARISON:  None. FINDINGS: Five non rib-bearing lumbar type vertebral bodies are present. Levoscoliosis with apex at L3. Vertebral bodies otherwise normally aligned with preservation of the normal lumbar lordosis. Vertebral body heights maintained without evidence for acute or chronic fracture. Moderate degenerative intervertebral disc space narrowing present at L2-3. Visualized sacrum intact.  SI joints approximated. No acute soft tissue abnormality. Prominent aortic atherosclerosis noted. IMPRESSION: 1. No radiographic evidence for acute abnormality within the lumbar spine. 2. Levoscoliosis with moderate degenerative spondylolysis at L2-3. 3. Aortic atherosclerosis. Electronically Signed   By: Jeannine Boga M.D.    On: 04/02/2018 02:00   Ct Thoracic Spine Wo Contrast  Result Date: 04/02/2018 CLINICAL DATA:  Initial evaluation for midthoracic and back pain. Progressive weakness. EXAM: CT THORACIC AND LUMBAR SPINE WITHOUT CONTRAST TECHNIQUE: Multidetector CT imaging of the thoracic and lumbar spine was performed without contrast. Multiplanar CT image reconstructions were also generated. COMPARISON:  Prior radiographs from earlier the same day. FINDINGS: CT THORACIC SPINE FINDINGS Alignment: Mild dextroscoliosis. Vertebral bodies otherwise normally aligned with preservation of the normal thoracic kyphosis. No listhesis. Vertebrae: Vertebral body heights are maintained without evidence for acute or chronic fracture. No discrete lytic or blastic osseous lesions. Paraspinal and other soft tissues: Paraspinous soft tissues demonstrate no acute abnormality. Trace layering bilateral pleural effusions with associated atelectasis. Cardiomegaly with pacemaker electrodes partially visualized. Aortic atherosclerosis. Disc levels: No significant degenerative disc disease seen within the thoracic spine. No appreciable canal stenosis or significant foraminal narrowing. Prominent osteoarthritic changes noted at the left T10 costovertebral articulation. CT LUMBAR SPINE FINDINGS Segmentation: Normal segmentation. Lowest well-formed disc labeled the L5-S1 level. Alignment: Levoscoliosis with apex at L2-3. Chronic 3 mm retrolisthesis of L5 on S1. Trace retrolisthesis of L3 on L4. Vertebral bodies otherwise normally aligned. Vertebrae: Vertebral body heights maintained without evidence for acute or chronic fracture. No discrete lytic or blastic osseous lesions. Chronic reactive endplate changes present about the L2-3 and L5-S1 interspaces. Paraspinal and other soft tissues: Paraspinous soft tissues demonstrate no acute abnormality. Prominent aortic atherosclerosis. There is question of a focal right renal mass, of similar attenuation relative to  the [place] renal parenchyma. Partially visualized left kidney within normal limits. Remainder of the visualized visceral structures grossly unremarkable. Disc levels: L1-2:  Right eccentric disc bulge with intervertebral disc space narrowing. Mild facet hypertrophy. No significant canal or foraminal stenosis. L2-3: Chronic intervertebral disc space narrowing with diffuse disc bulge and mild disc desiccation. Chronic reactive endplate changes with marginal endplate osteophytic spurring on the right. Moderate right with mild left facet hypertrophy. Resultant mild to moderate right lateral recess narrowing without significant canal stenosis. Mild right foraminal narrowing. L3-4: Diffuse disc bulge with disc desiccation. Moderate facet hypertrophy. Resultant mild to moderate canal and lateral recess stenosis, greater on the right. Mild to moderate right L3 foraminal narrowing. L4-5: Diffuse disc bulge with disc desiccation. Disc bulging eccentric to the right without focal disc protrusion. Moderate facet and ligament flavum hypertrophy. Resultant mild canal with moderate bilateral lateral recess stenosis. Mild to moderate right with mild left L4 foraminal narrowing. L5-S1: Retrolisthesis. Diffuse disc bulge with disc desiccation. Moderate facet ligament flavum hypertrophy. Resultant moderate to severe bilateral subarticular stenosis, greater on the left. Severe left with mild to moderate right L5 foraminal narrowing. IMPRESSION: CT THORACIC SPINE IMPRESSION 1. No acute abnormality within the thoracic spine. 2. Mild dextroscoliosis. No significant degenerative disease or stenosis within the thoracic spine. 3. Small layering bilateral pleural effusions with associated atelectasis. CT LUMBAR SPINE IMPRESSION 1. No acute abnormality within the lumbar spine. 2. Chronic degenerative spondylolysis at L5-S1 with resultant severe bilateral subarticular stenosis with severe left L5 foraminal narrowing. 3. Multifactorial  degenerative changes with resultant mild to moderate canal and lateral recess narrowing at L3-4 and L4-5. 4. Question focal lesion/mass within the interpolar right kidney, not entirely certain on this noncontrast examination. Further assessment with cross-sectional imaging of the abdomen and pelvis, either CT or MRI, recommended for complete characterization. Electronically Signed   By: Jeannine Boga M.D.   On: 04/02/2018 05:16   Ct Lumbar Spine Wo Contrast  Result Date: 04/02/2018 CLINICAL DATA:  Initial evaluation for midthoracic and back pain. Progressive weakness. EXAM: CT THORACIC AND LUMBAR SPINE WITHOUT CONTRAST TECHNIQUE: Multidetector CT imaging of the thoracic and lumbar spine was performed without contrast. Multiplanar CT image reconstructions were also generated. COMPARISON:  Prior radiographs from earlier the same day. FINDINGS: CT THORACIC SPINE FINDINGS Alignment: Mild dextroscoliosis. Vertebral bodies otherwise normally aligned with preservation of the normal thoracic kyphosis. No listhesis. Vertebrae: Vertebral body heights are maintained without evidence for acute or chronic fracture. No discrete lytic or blastic osseous lesions. Paraspinal and other soft tissues: Paraspinous soft tissues demonstrate no acute abnormality. Trace layering bilateral pleural effusions with associated atelectasis. Cardiomegaly with pacemaker electrodes partially visualized. Aortic atherosclerosis. Disc levels: No significant degenerative disc disease seen within the thoracic spine. No appreciable canal stenosis or significant foraminal narrowing. Prominent osteoarthritic changes noted at the left T10 costovertebral articulation. CT LUMBAR SPINE FINDINGS Segmentation: Normal segmentation. Lowest well-formed disc labeled the L5-S1 level. Alignment: Levoscoliosis with apex at L2-3. Chronic 3 mm retrolisthesis of L5 on S1. Trace retrolisthesis of L3 on L4. Vertebral bodies otherwise normally aligned. Vertebrae:  Vertebral body heights maintained without evidence for acute or chronic fracture. No discrete lytic or blastic osseous lesions. Chronic reactive endplate changes present about the L2-3 and L5-S1 interspaces. Paraspinal and other soft tissues: Paraspinous soft tissues demonstrate no acute abnormality. Prominent aortic atherosclerosis. There is question of a focal right renal mass, of similar attenuation relative to the [place] renal parenchyma. Partially visualized left kidney within normal limits. Remainder of the visualized visceral structures grossly unremarkable. Disc levels: L1-2: Right eccentric disc bulge with intervertebral disc space narrowing. Mild facet hypertrophy. No  significant canal or foraminal stenosis. L2-3: Chronic intervertebral disc space narrowing with diffuse disc bulge and mild disc desiccation. Chronic reactive endplate changes with marginal endplate osteophytic spurring on the right. Moderate right with mild left facet hypertrophy. Resultant mild to moderate right lateral recess narrowing without significant canal stenosis. Mild right foraminal narrowing. L3-4: Diffuse disc bulge with disc desiccation. Moderate facet hypertrophy. Resultant mild to moderate canal and lateral recess stenosis, greater on the right. Mild to moderate right L3 foraminal narrowing. L4-5: Diffuse disc bulge with disc desiccation. Disc bulging eccentric to the right without focal disc protrusion. Moderate facet and ligament flavum hypertrophy. Resultant mild canal with moderate bilateral lateral recess stenosis. Mild to moderate right with mild left L4 foraminal narrowing. L5-S1: Retrolisthesis. Diffuse disc bulge with disc desiccation. Moderate facet ligament flavum hypertrophy. Resultant moderate to severe bilateral subarticular stenosis, greater on the left. Severe left with mild to moderate right L5 foraminal narrowing. IMPRESSION: CT THORACIC SPINE IMPRESSION 1. No acute abnormality within the thoracic spine. 2.  Mild dextroscoliosis. No significant degenerative disease or stenosis within the thoracic spine. 3. Small layering bilateral pleural effusions with associated atelectasis. CT LUMBAR SPINE IMPRESSION 1. No acute abnormality within the lumbar spine. 2. Chronic degenerative spondylolysis at L5-S1 with resultant severe bilateral subarticular stenosis with severe left L5 foraminal narrowing. 3. Multifactorial degenerative changes with resultant mild to moderate canal and lateral recess narrowing at L3-4 and L4-5. 4. Question focal lesion/mass within the interpolar right kidney, not entirely certain on this noncontrast examination. Further assessment with cross-sectional imaging of the abdomen and pelvis, either CT or MRI, recommended for complete characterization. Electronically Signed   By: Jeannine Boga M.D.   On: 04/02/2018 05:16    Procedures Procedures (including critical care time)  Medications Ordered in ED Medications  fentaNYL (SUBLIMAZE) injection 50 mcg (50 mcg Intravenous Given 04/02/18 0052)  morphine 4 MG/ML injection 4 mg (4 mg Intravenous Given 04/02/18 0318)     Initial Impression / Assessment and Plan / ED Course  I have reviewed the triage vital signs and the nursing notes.  Pertinent labs & imaging results that were available during my care of the patient were reviewed by me and considered in my medical decision making (see chart for details).  Clinical Course as of Apr 03 739  Wed Apr 02, 2018  0003 WBC(!): 16.5 [MA]  0544 Patient is ambulatory with minimal assistance in the hallway.  Still complaining of some back pain but improved.   [CH]    Clinical Course User Index [CH] Anel Purohit, Barbette Hair, MD [MA] [place]    Patient presents with generalized weakness.  Difficulty getting up and going.  Denies any other symptoms.  Denies infectious symptoms cough, urinary symptoms.  She is overall nontoxic on exam.  Strength appears intact and  symmetric.  No signs or symptoms of stroke.  She does seem limited secondary to pain when sitting up on side of bed.  She is able to bear weight.  Daughter does report recent course of prednisone.  Given her age and recent prednisone, she does have some red flags.  Plain films are negative for acute fracture.  She is also had a recent rheumatologic workup.  She had moderately elevated sed rate and CRP at that time.  She is afebrile.  She cannot obtain an MRI.  Sed rate and CRP are 48 and 5.7 respectively.  These are downtrending.  Low suspicion at this time for epidural abscess.  Given difficulty  getting up and generalized pain, other considerations include rheumatologic disorders including PMR.  Patient is able to sit up and ambulate with minimal assist after pain medication.  She does not appear to have an acute emergent process.  I do feel that she needs close follow-up with her primary physician and rheumatology for further workup if symptoms do not improve.  She was given a short course of pain medication but advised not to combine with other sedating medications.  Of note she did have an incidental lesion on her right kidney.  Recommend outpatient CT with contrast.  This was discussed with the patient and her daughter.  Furthermore, CT noted bilateral small pleural effusions with atelectasis.  Patient denies any cough and she is afebrile.  She does have a white count of 16.5 which is of unknown significance.  Would not elect to treat pneumonia at this time as she is otherwise asymptomatic.  After history, exam, and medical workup I feel the patient has been appropriately medically screened and is safe for discharge home. Pertinent diagnoses were discussed with the patient. Patient was given return precautions.   Final Clinical Impressions(s) / ED Diagnoses   Final diagnoses:  Generalized weakness  Acute bilateral low back pain without sciatica    ED Discharge Orders        Ordered     HYDROcodone-acetaminophen (NORCO/VICODIN) 5-325 MG tablet  Every 6 hours PRN     04/02/18 0547       Clariza Sickman, Barbette Hair, MD 04/02/18 6429    Merryl Hacker, MD 04/14/18 [phone]

## 2018-04-03 ENCOUNTER — Other Ambulatory Visit: Payer: Self-pay

## 2018-04-03 ENCOUNTER — Encounter: Payer: Self-pay | Admitting: Family Medicine

## 2018-04-03 ENCOUNTER — Ambulatory Visit (INDEPENDENT_AMBULATORY_CARE_PROVIDER_SITE_OTHER): Payer: Medicare Other | Admitting: Family Medicine

## 2018-04-03 VITALS — BP 102/68 | HR 77 | Temp 98.0°F | Resp 16 | Ht 63.78 in | Wt 108.0 lb

## 2018-04-03 DIAGNOSIS — E118 Type 2 diabetes mellitus with unspecified complications: Secondary | ICD-10-CM | POA: Diagnosis not present

## 2018-04-03 DIAGNOSIS — Z9181 History of falling: Secondary | ICD-10-CM

## 2018-04-03 DIAGNOSIS — Z111 Encounter for screening for respiratory tuberculosis: Secondary | ICD-10-CM

## 2018-04-03 DIAGNOSIS — M130 Polyarthritis, unspecified: Secondary | ICD-10-CM

## 2018-04-03 DIAGNOSIS — R531 Weakness: Secondary | ICD-10-CM | POA: Diagnosis not present

## 2018-04-03 DIAGNOSIS — M539 Dorsopathy, unspecified: Secondary | ICD-10-CM

## 2018-04-03 DIAGNOSIS — Z794 Long term (current) use of insulin: Secondary | ICD-10-CM

## 2018-04-03 MED ORDER — GABAPENTIN 100 MG PO CAPS: ORAL_CAPSULE | ORAL | 3 refills | Status: DC

## 2018-04-03 NOTE — Patient Instructions (Signed)
     IF you received an x-ray today, you will receive an invoice from [facility]. Please contact [facility] at [phone] with questions or concerns regarding your invoice.   IF you received labwork today, you will receive an invoice from [facility]. Please contact [facility] at [phone] with questions or concerns regarding your invoice.   Our billing staff will not be able to assist you with questions regarding bills from these companies.  You will be contacted with the lab results as soon as they are available. The fastest way to get your results is to activate your My Chart account. Instructions are located on the last page of this paperwork. If you have not heard from us regarding the results in 2 weeks, please contact this office.     

## 2018-04-03 NOTE — Progress Notes (Signed)
4/11/201910:43 AM  Judith [name] Apr 05, 1941, 77 y.o. female [id number]  Chief Complaint  Patient presents with  . Hospitalization Follow-up    pt was seen in the ER on 4/9 for weakness and pain in the body. Mainly back, neck, and shoulders   . Gait Problem    pt has had multiple falls and off balance ( 3 falls since 4/9)     HPI:   Patient is a 77 y.o. female with past medical history significant for DM2, neuropathy, gastroparesis, partial gastrectomy 2/2 ulcers, pacemaker who presents today for followup on multiple issues  Saw rheum - wrist swelling/pain, OA/pseudogout, started on colchicine and prednisone, extended prednisone again - first dose today. Patient was NOT having symptoms that started 5 days ago. Sees rheum again on Monday.  Patient since Sunday, 5 days ago, had significant decline in function due to pain of neck, low back, shoulders and hip, very stiff, patient feels very weak, has fallen 3 times since seen in the ED, was very hard to lift off the floor due to fall, ER eval non acute  Was given small rx for vicodin in the ER, improved pain but not function States that increased gabapentin has helped with neuropathic pain Pred caused rise on cbgs into 300s, increased lantus 35u AM and humalog base to 10u with cont SS of 2/50/150 cbgs back in the 100s Has not started celebrex  Daughter brings in forms for long term care services, patient is not safe to be left alone, daughter does not have FMLA as an option, looking into placement. Patient understands and accepts these changes. Needs TB skin test.  Seen in the ER on 4/9 Labs and EKG unremarkable crp and esr improved, 5.7/48 down from 22.4/81 WBC 16, in setting of prednisone burst  CT THORACIC SPINE IMPRESSION 1. No acute abnormality within the thoracic spine. 2. Mild dextroscoliosis. No significant degenerative disease or stenosis within the thoracic spine. 3. Small layering bilateral pleural effusions with  associated atelectasis.  CT LUMBAR SPINE IMPRESSION 1. No acute abnormality within the lumbar spine. 2. Chronic degenerative spondylolysis at L5-S1 with resultant severe bilateral subarticular stenosis with severe left L5 foraminal narrowing. 3. Multifactorial degenerative changes with resultant mild to moderate canal and lateral recess narrowing at L3-4 and L4-5. 4. Question focal lesion/mass within the interpolar right kidney, not entirely certain on this noncontrast examination. Further assessment with cross-sectional imaging of the abdomen and pelvis, either CT or MRI, recommended for complete characterization.  Depression screen [facility] 2/9 04/03/2018 03/14/2018 02/27/2018  Decreased Interest 0 0 0  Down, Depressed, Hopeless 0 0 0  PHQ - 2 Score 0 0 0  Altered sleeping - - -  Tired, decreased energy - - -  Change in appetite - - -  Feeling bad or failure about yourself  - - -  Trouble concentrating - - -  Moving slowly or fidgety/restless - - -  Suicidal thoughts - - -  PHQ-9 Score - - -  Difficult doing work/chores - - -    Allergies  Allergen Reactions  . Asa [Aspirin] Other (See Comments)    Causes ulcers   . Sulfa Antibiotics Rash    Prior to Admission medications   Medication Sig Start Date End Date Taking? Authorizing Provider  Biotin 1000 MCG tablet Take 1,000 mcg by mouth daily.   Yes [provider]  blood glucose meter kit and supplies KIT Dispense based on patient and insurance preference. Check capillary glucose three times a day,  before meals.  Dx E11.65, Z79.4 10/29/17  Yes Rutherford Guys, MD  carvedilol (COREG) 12.5 MG tablet TAKE 1 TABLET (12.5 MG TOTAL) 2 (TWO) TIMES DAILY WITH A MEAL BY MOUTH. 01/27/18  Yes Rutherford Guys, MD  celecoxib (CELEBREX) 100 MG capsule Take 1 capsule (100 mg total) by mouth 2 (two) times daily. 04/02/18  Yes Rutherford Guys, MD  diclofenac sodium (VOLTAREN) 1 % GEL Apply 2 g topically 4 (four) times daily. 01/24/18  Yes  Rutherford Guys, MD  DULoxetine (CYMBALTA) 60 MG capsule TAKE 1 CAPSULE (60 MG TOTAL) DAILY BY MOUTH. 01/27/18  Yes Rutherford Guys, MD  ferrous sulfate 325 (65 FE) MG tablet TAKE 1 TABLET BY MOUTH EVERY DAY WITH BREAKFAST 02/26/18  Yes Rutherford Guys, MD  gabapentin (NEURONTIN) 100 MG capsule Take 2 capsules (200 mg total) by mouth 3 (three) times daily. Patient taking differently: Take 100-400 mg by mouth See admin instructions. Take 1 capsule every morning and at lunch then take 4 capsules at bedtime 03/18/18  Yes Rutherford Guys, MD  glucose blood (ONE TOUCH ULTRA TEST) test strip USE TO CHECK GLUCOSE LEVELS 3 TIMES A DAY, DX E11.9, Z79.4 11/30/17  Yes Rutherford Guys, MD  hydrochlorothiazide (HYDRODIURIL) 25 MG tablet Take 0.5 tablets (12.5 mg total) by mouth daily. 01/24/18  Yes Rutherford Guys, MD  HYDROcodone-acetaminophen (NORCO/VICODIN) 5-325 MG tablet Take 1 tablet by mouth every 6 (six) hours as needed. 04/02/18  Yes Horton, Barbette Hair, MD  Insulin Glargine (LANTUS SOLOSTAR) 100 UNIT/ML Solostar Pen Inject 26 Units into the skin daily at 10 pm. Patient taking differently: Inject 24 Units into the skin daily.  01/24/18  Yes Rutherford Guys, MD  insulin lispro (HUMALOG) 100 UNIT/ML injection Inject 1-10 Units into the skin 3 (three) times daily before meals. Sliding scale   Yes [provider]  Insulin Pen Needle (B-D ULTRAFINE III SHORT PEN) 31G X 8 MM MISC To use as directed with insulin, four times a day. Dx E11.9, Z79.4 11/08/17  Yes Rutherford Guys, MD  losartan (COZAAR) 25 MG tablet TAKE 1 TABLET BY MOUTH EVERY DAY 01/27/18  Yes Rutherford Guys, MD  Multiple Vitamin (MULTIVITAMIN) tablet Take 1 tablet daily by mouth.   Yes [provider]  pantoprazole (PROTONIX) 40 MG tablet TAKE 1 TABLET (40 MG TOTAL) DAILY BY MOUTH. 01/27/18  Yes Rutherford Guys, MD  ranitidine (ZANTAC) 150 MG capsule Take 1 capsule (150 mg total) at bedtime by mouth. 10/30/17  Yes Rutherford Guys, MD    triamcinolone cream (KENALOG) 0.5 % Apply 1 application topically 2 (two) times daily. 03/14/18  Yes Rutherford Guys, MD    Past Medical History:  Diagnosis Date  . Anemia   . Arthritis   . Biliary gastritis   . Blood transfusion without reported diagnosis    2007  . Depression   . Diabetes mellitus type 2 with complications ([place])   . Duodenal ulcer disease   . Encounter for long-term (current) use of insulin ([place])   . Gastric ulcer   . Gastroparesis    severe  . GERD (gastroesophageal reflux disease)   . Gout    tested for gout but was not  . Heart murmur   . Hyperlipidemia    in past not now  . Hypertension   . Hypokalemia, gastrointestinal losses    secondary to severe gastroparesis  . Osteoporosis    just had bone density test possible   .  Peripheral neuropathy   . Presence of cardiac pacemaker   . S/P partial gastrectomy    due to severe gastric and gudoenal ulcers  . Seasonal allergies   . Sleep apnea    could not use CPAP  . UTI (urinary tract infection)     Past Surgical History:  Procedure Laterality Date  . COLONOSCOPY  02/11/2018   no polyps, + small internal hemorrhoids  . EUS N/A 12/12/2017   Procedure: UPPER ENDOSCOPIC ULTRASOUND (EUS) RADIAL;  Surgeon: [name], MD;  Location: [facility] END[facility]COPY;  Service: Endoscopy;  Laterality: N/A;  . GASTRECTOMY    . JOINT REPLACEMENT    . pace maker    . PARTIAL GASTRECTOMY    . TONSILLECTOMY     45 years ago  . UPPER GASTROINTESTINAL ENDOSCOPY      Social History   Tobacco Use  . Smoking status: Never Smoker  . Smokeless tobacco: Never Used  Substance Use Topics  . Alcohol use: No    Alcohol/week: 0.0 oz    Family History  Problem Relation Age of Onset  . Diabetes Mother   . Diabetes Father   . Heart disease Father   . Diabetes Sister   . Heart disease Son   . Colon cancer Neg Hx   . Colon polyps Neg Hx   . Rectal cancer Neg Hx   . Stomach cancer Neg Hx   . Esophageal cancer Neg Hx      Review of Systems  Constitutional: Positive for malaise/fatigue (tired as has not slept for past 2 nights). Negative for chills and fever.  Respiratory: Negative for cough and shortness of breath.   Cardiovascular: Negative for chest pain and palpitations.  Gastrointestinal: Negative for abdominal pain, nausea and vomiting.  Musculoskeletal: Positive for back pain, joint pain and neck pain.    OBJECTIVE:  Blood pressure 102/68, pulse 77, temperature 98 F (36.7 C), temperature source Oral, resp. rate 16, height 5' 3.78" (1.62 m), weight 108 lb (49 kg), SpO2 95 %.  Wt Readings from Last 3 Encounters:  04/03/18 108 lb (49 kg)  04/01/18 105 lb (47.6 kg)  03/14/18 105 lb 9.6 oz (47.9 kg)    Physical Exam  Constitutional: She is oriented to person, place, and time.  Frail, chronically-ill appearing  HENT:  Head: Normocephalic and atraumatic.  Mouth/Throat: Mucous membranes are normal.  Eyes: Pupils are equal, round, and reactive to light. EOM are normal. No scleral icterus.  Neck: Neck supple.  Pulmonary/Chest: Effort normal.  Neurological: She is alert and oriented to person, place, and time.  Skin: Skin is warm and dry.  Nursing note and vitals reviewed. uses 4WW w seat   ASSESSMENT and PLAN  1. Polyarthritis 2. History of recent fall 5. Multilevel degenerative disc disease 6. General weakness Patient with recent significant decline in function with recurring falls, unable to perform ADLs without assistance, not safe being by herself. PMR vs progession of DDD. Started prednisone again today. Sees rheum in several days. Consider spine surgeon referral. Paperwork for long term care facility completed. Requesting PT as well, goal would be for her to move back out to either independent or assisted living once stronger, healthier.   3. Type 2 diabetes mellitus with complication, with long-term current use of insulin (HCC) CBGs at goal with changes in insulin. They understand  reducing back to previous doses once prednisone course is completed and cbgs starting coming back down  4. Screening-pulmonary TB  As needed for long term  care facility - TB Skin Test    Return in about 2 days (around 04/05/2018) for nurse visit - TB read.    Rutherford Guys, MD [facility] at [facility] [place], [place] [address] Ph.  [phone] Fax [phone]

## 2018-04-04 DIAGNOSIS — E113391 Type 2 diabetes mellitus with moderate nonproliferative diabetic retinopathy without macular edema, right eye: Secondary | ICD-10-CM | POA: Diagnosis not present

## 2018-04-04 DIAGNOSIS — H25013 Cortical age-related cataract, bilateral: Secondary | ICD-10-CM | POA: Diagnosis not present

## 2018-04-04 DIAGNOSIS — E113291 Type 2 diabetes mellitus with mild nonproliferative diabetic retinopathy without macular edema, right eye: Secondary | ICD-10-CM | POA: Diagnosis not present

## 2018-04-04 DIAGNOSIS — H2513 Age-related nuclear cataract, bilateral: Secondary | ICD-10-CM | POA: Diagnosis not present

## 2018-04-04 DIAGNOSIS — H2512 Age-related nuclear cataract, left eye: Secondary | ICD-10-CM | POA: Diagnosis not present

## 2018-04-05 ENCOUNTER — Ambulatory Visit (INDEPENDENT_AMBULATORY_CARE_PROVIDER_SITE_OTHER): Payer: Medicare Other | Admitting: Family Medicine

## 2018-04-05 DIAGNOSIS — Z111 Encounter for screening for respiratory tuberculosis: Secondary | ICD-10-CM

## 2018-04-05 LAB — TB SKIN TEST
Induration: 0 mm
TB Skin Test: NEGATIVE

## 2018-04-05 NOTE — Progress Notes (Signed)
PPD reading  

## 2018-04-07 DIAGNOSIS — M25572 Pain in left ankle and joints of left foot: Secondary | ICD-10-CM | POA: Diagnosis not present

## 2018-04-07 DIAGNOSIS — R768 Other specified abnormal immunological findings in serum: Secondary | ICD-10-CM | POA: Diagnosis not present

## 2018-04-07 DIAGNOSIS — M25531 Pain in right wrist: Secondary | ICD-10-CM | POA: Diagnosis not present

## 2018-04-07 DIAGNOSIS — K219 Gastro-esophageal reflux disease without esophagitis: Secondary | ICD-10-CM | POA: Diagnosis not present

## 2018-04-07 DIAGNOSIS — M1990 Unspecified osteoarthritis, unspecified site: Secondary | ICD-10-CM | POA: Diagnosis not present

## 2018-04-07 DIAGNOSIS — E119 Type 2 diabetes mellitus without complications: Secondary | ICD-10-CM | POA: Diagnosis not present

## 2018-04-07 DIAGNOSIS — M25431 Effusion, right wrist: Secondary | ICD-10-CM | POA: Diagnosis not present

## 2018-04-07 DIAGNOSIS — M1189 Other specified crystal arthropathies, multiple sites: Secondary | ICD-10-CM | POA: Diagnosis not present

## 2018-04-07 DIAGNOSIS — R21 Rash and other nonspecific skin eruption: Secondary | ICD-10-CM | POA: Diagnosis not present

## 2018-04-18 ENCOUNTER — Encounter: Payer: Self-pay | Admitting: Family Medicine

## 2018-04-22 ENCOUNTER — Telehealth: Payer: Self-pay

## 2018-04-22 ENCOUNTER — Emergency Department (HOSPITAL_COMMUNITY): Payer: Medicare Other

## 2018-04-22 ENCOUNTER — Emergency Department (HOSPITAL_COMMUNITY)
Admission: EM | Admit: 2018-04-22 | Discharge: 2018-04-22 | Disposition: A | Discharge status: Home / Self Care | Payer: Medicare Other | Attending: Emergency Medicine | Admitting: Emergency Medicine

## 2018-04-22 ENCOUNTER — Encounter (HOSPITAL_COMMUNITY): Payer: Self-pay

## 2018-04-22 DIAGNOSIS — M159 Polyosteoarthritis, unspecified: Secondary | ICD-10-CM | POA: Diagnosis not present

## 2018-04-22 DIAGNOSIS — E1165 Type 2 diabetes mellitus with hyperglycemia: Secondary | ICD-10-CM | POA: Insufficient documentation

## 2018-04-22 DIAGNOSIS — R531 Weakness: Secondary | ICD-10-CM | POA: Diagnosis not present

## 2018-04-22 DIAGNOSIS — E1142 Type 2 diabetes mellitus with diabetic polyneuropathy: Secondary | ICD-10-CM | POA: Diagnosis not present

## 2018-04-22 DIAGNOSIS — M48061 Spinal stenosis, lumbar region without neurogenic claudication: Secondary | ICD-10-CM | POA: Diagnosis not present

## 2018-04-22 DIAGNOSIS — I951 Orthostatic hypotension: Secondary | ICD-10-CM | POA: Diagnosis not present

## 2018-04-22 DIAGNOSIS — K3184 Gastroparesis: Secondary | ICD-10-CM | POA: Diagnosis not present

## 2018-04-22 DIAGNOSIS — M4307 Spondylolysis, lumbosacral region: Secondary | ICD-10-CM | POA: Diagnosis not present

## 2018-04-22 DIAGNOSIS — Z7952 Long term (current) use of systemic steroids: Secondary | ICD-10-CM | POA: Diagnosis not present

## 2018-04-22 DIAGNOSIS — Z9181 History of falling: Secondary | ICD-10-CM | POA: Diagnosis not present

## 2018-04-22 DIAGNOSIS — Z950 Presence of cardiac pacemaker: Secondary | ICD-10-CM | POA: Insufficient documentation

## 2018-04-22 DIAGNOSIS — R739 Hyperglycemia, unspecified: Secondary | ICD-10-CM

## 2018-04-22 DIAGNOSIS — I10 Essential (primary) hypertension: Secondary | ICD-10-CM | POA: Diagnosis not present

## 2018-04-22 DIAGNOSIS — Z794 Long term (current) use of insulin: Secondary | ICD-10-CM | POA: Diagnosis not present

## 2018-04-22 DIAGNOSIS — Z79899 Other long term (current) drug therapy: Secondary | ICD-10-CM | POA: Diagnosis not present

## 2018-04-22 DIAGNOSIS — Z903 Acquired absence of stomach [part of]: Secondary | ICD-10-CM | POA: Diagnosis not present

## 2018-04-22 DIAGNOSIS — R5383 Other fatigue: Secondary | ICD-10-CM | POA: Diagnosis not present

## 2018-04-22 DIAGNOSIS — E1143 Type 2 diabetes mellitus with diabetic autonomic (poly)neuropathy: Secondary | ICD-10-CM | POA: Diagnosis not present

## 2018-04-22 DIAGNOSIS — R404 Transient alteration of awareness: Secondary | ICD-10-CM | POA: Diagnosis not present

## 2018-04-22 LAB — URINALYSIS, ROUTINE W REFLEX MICROSCOPIC
BILIRUBIN URINE: NEGATIVE
Glucose, UA: 50 mg/dL — AB
HGB URINE DIPSTICK: NEGATIVE
Ketones, ur: NEGATIVE mg/dL
Leukocytes, UA: NEGATIVE
Nitrite: NEGATIVE
PROTEIN: NEGATIVE mg/dL
Specific Gravity, Urine: 1.011 (ref 1.005–1.030)
pH: 5 (ref 5.0–8.0)

## 2018-04-22 LAB — CBC WITH DIFFERENTIAL/PLATELET
Basophils Absolute: 0.1 10*3/uL (ref 0.0–0.1)
Basophils Relative: 0 %
EOS ABS: 0.2 10*3/uL (ref 0.0–0.7)
EOS PCT: 2 %
HCT: 36.8 % (ref 36.0–46.0)
Hemoglobin: 11.8 g/dL — ABNORMAL LOW (ref 12.0–15.0)
LYMPHS ABS: 1.3 10*3/uL (ref 0.7–4.0)
Lymphocytes Relative: 12 %
MCH: 28 pg (ref 26.0–34.0)
MCHC: 32.1 g/dL (ref 30.0–36.0)
MCV: 87.4 fL (ref 78.0–100.0)
MONOS PCT: 9 %
Monocytes Absolute: 1 10*3/uL (ref 0.1–1.0)
Neutro Abs: 9 10*3/uL — ABNORMAL HIGH (ref 1.7–7.7)
Neutrophils Relative %: 77 %
PLATELETS: 302 10*3/uL (ref 150–400)
RBC: 4.21 MIL/uL (ref 3.87–5.11)
RDW: 16.4 % — AB (ref 11.5–15.5)
WBC: 11.6 10*3/uL — AB (ref 4.0–10.5)

## 2018-04-22 LAB — COMPREHENSIVE METABOLIC PANEL
ALK PHOS: 103 U/L (ref 38–126)
ALT: 20 U/L (ref 14–54)
AST: 29 U/L (ref 15–41)
Albumin: 3 g/dL — ABNORMAL LOW (ref 3.5–5.0)
Anion gap: 10 (ref 5–15)
BUN: 22 mg/dL — ABNORMAL HIGH (ref 6–20)
CALCIUM: 8.4 mg/dL — AB (ref 8.9–10.3)
CO2: 19 mmol/L — AB (ref 22–32)
CREATININE: 0.69 mg/dL (ref 0.44–1.00)
Chloride: 107 mmol/L (ref 101–111)
GFR calc non Af Amer: >60 mL/min (ref >60)
GLUCOSE: 230 mg/dL — AB (ref 65–99)
Potassium: 4.3 mmol/L (ref 3.5–5.1)
SODIUM: 136 mmol/L (ref 135–145)
Total Bilirubin: 0.2 mg/dL — ABNORMAL LOW (ref 0.3–1.2)
Total Protein: 6.2 g/dL — ABNORMAL LOW (ref 6.5–8.1)

## 2018-04-22 LAB — CBG MONITORING, ED
GLUCOSE-CAPILLARY: 100 mg/dL — AB (ref 65–99)
Glucose-Capillary: 447 mg/dL — ABNORMAL HIGH (ref 65–99)

## 2018-04-22 LAB — TROPONIN I: Troponin I: <0.03 ng/mL (ref <0.03)

## 2018-04-22 MED ORDER — SODIUM CHLORIDE 0.9 % IV BOLUS
1000.0000 mL | Freq: Once | INTRAVENOUS | Status: AC
  Administered 2018-04-22: 1000 mL via INTRAVENOUS

## 2018-04-22 MED ORDER — INSULIN ASPART 100 UNIT/ML ~~LOC~~ SOLN
10.0000 [IU] | Freq: Once | SUBCUTANEOUS | Status: AC
  Administered 2018-04-22: 10 [IU] via SUBCUTANEOUS
  Filled 2018-04-22: qty 1

## 2018-04-22 NOTE — ED Notes (Addendum)
PA Alyssa instructed to give patient something to eat such as "a sandwich". Patient given ham sandwich.

## 2018-04-22 NOTE — ED Triage Notes (Addendum)
Patient arrived via [facility] from [facility] facility. Staff notice patient hasn't felt good over the past few weeks. Recent prednizone use.Fluctuation of cbg have been higher then normal. CBG- 470 per ems. Daughter says its not unusual for her to be in the 200 range with prednisone use but 400 is abnormal for patient. Patient has a Psychologist, forensic only. ems was getting a paced rhythm but monitor will pick up extra beats (Artifacts). No n/v, fever, or chills. DM-type 2. Orthostatics were negative, no change per ems. Facility states orthostatics were positive and dropped 20 points. EKG at bedside.

## 2018-04-22 NOTE — ED Notes (Signed)
Patient given peanut butter and crackers and milk per order from [facility].

## 2018-04-22 NOTE — ED Notes (Signed)
Unable to get blood work. Attempted 3 times and charge nurse stacy attempted 3 times. Stacy,RN able to get IV but no blood. Will let fluids run for 30 min and will try again.

## 2018-04-22 NOTE — Telephone Encounter (Signed)
Received message in CRM. Contacted David with PT and asked to speak with a medical personnel at [facility]. Spoke with [facility] LPN. Advised LPN to transport pt to the ED based on findings from PT evaluation.   Copied from [place] [phone]. Topic: General - Other >> Apr 22, 2018 12:02 PM Oneta Rack wrote: Osvaldo Human name: Shanon Brow  Relation to pt: PT from [facility]  Call back number: [phone]    Reason for call:  PT wanted to inform PCP patient BP dropped  from 140/60 to 88/60 when she stood up. The med tech already called in (awaiting call back from office ) regarding patient BS being 400 and then rechecked 26 minutes later increased to 454.   Requesting verbal orders for PT 2x 5 and requesting nursing eval orders to manage patient diabetes, please advise    >> Apr 22, 2018 12:09 PM Oneta Rack wrote: Osvaldo Human name: Shanon Brow  Relation to pt: PT from [facility]  Call back number: [phone]    Reason for call:  PT wanted to inform PCP patient BP dropped  from 140/60 to 88/60 when she stood up. The med tech already called in (awaiting call back from office ) regarding patient BS being 400 and then rechecked 26 minutes later increased to 454.   Requesting verbal orders for PT 2x 5 and requesting nursing eval orders to manage patient diabetes, please advise

## 2018-04-22 NOTE — ED Provider Notes (Signed)
[place] DEPT Provider Note   CSN: [id number] Arrival date & time: 04/22/18  1331     History   Chief Complaint No chief complaint on file.   HPI Judith [name] is a 77 y.o. female.  HPI  Patient is a 77 year old female with a history of type 2 diabetes mellitus, insulin-dependent, pacemaker dependency, GERD, gastroparesis, gastritis, peripheral neuropathy, hypertension presenting for hyperglycemia and reported low blood pressure.  Patient presents with her daughter who assists in history taking.  Per patient her daughter, she has been on 33m prednisone for approximately the last 20 days due to polyarthralgias.  Due to this change in medication, patient also has undergone insulin adjustments.  Patient recently moved into assisted living 3 days ago, and her endocrinologist, Dr. ALeonia Corona adjusted her short acting sliding scale schedule for ease of use by facility.  Patient's daughter reports labile blood sugars over the past week including a hypoglycemic episode last week that prompted decreasing Lantus.  Patient reports generalized weakness, as well as increasing bilateral and symmetric shoulder and neck pain consistent with her prior arthralgias, but denies any fevers, chills, cough, shortness of breath, chest pain, nausea, vomiting, abdominal pain, diarrhea, dysuria, urgency, or frequency.  Patient denies any focal weakness or numbness.  Facility reported that patient had positive orthostatic hypotension today, which in addition to elevated glucose, which rose from 136 to 447 routine breakfast and lunch today prompted her ED visit.  Past Medical History:  Diagnosis Date  . Anemia   . Arthritis   . Biliary gastritis   . Blood transfusion without reported diagnosis    2007  . Depression   . Diabetes mellitus type 2 with complications ([facility]   . Duodenal ulcer disease   . Encounter for long-term ([place]) use of insulin ([place]   . Gastric ulcer   .  Gastroparesis    severe  . GERD (gastroesophageal reflux disease)   . Gout    tested for gout but was not  . Heart murmur   . Hyperlipidemia    in past not now  . Hypertension   . Hypokalemia, gastrointestinal losses    secondary to severe gastroparesis  . Osteoporosis    just had bone density test possible   . Peripheral neuropathy   . Presence of cardiac pacemaker   . S/P partial gastrectomy    due to severe gastric and gudoenal ulcers  . Seasonal allergies   . Sleep apnea    could not use CPAP  . UTI (urinary tract infection)     Patient Active Problem List   Diagnosis Date Noted  . Multilevel degenerative disc disease 04/03/2018  . Acute blood loss anemia 01/17/2018  . Gallstones 01/17/2018  . Weight loss, unintentional 01/17/2018  . Dilated bile duct   . Elevated liver function tests   . Gastritis and gastroduodenitis   . Complete heart block ([facility] [place] 11/09/2017  . S/P partial gastrectomy   . Peripheral neuropathy   . Hypertension   . Gastroparesis   . Duodenal ulcer disease   . Diabetes mellitus type 2 with complications ([facility]   . Depression   . Biliary gastritis   . Seasonal allergies   . Presence of cardiac pacemaker   . Gout   . Cervical spine arthritis 04/05/2016  . Long-term insulin use in type 2 diabetes ([facility] 04/05/2016    Past Surgical History:  Procedure Laterality Date  . COLONOSCOPY  02/11/2018   no polyps, + small internal hemorrhoids  .  EUS N/A 12/12/2017   Procedure: UPPER ENDOSCOPIC ULTRASOUND (EUS) RADIAL;  Surgeon: [name], MD;  Location: [facility];  Service: Endoscopy;  Laterality: N/A;  . GASTRECTOMY    . JOINT REPLACEMENT    . pace maker    . PARTIAL GASTRECTOMY    . TONSILLECTOMY     45 years ago  . UPPER GASTROINTESTINAL ENDOSCOPY       OB History   None      Home Medications    Prior to Admission medications   Medication Sig Start Date End Date Taking? Authorizing Provider  Biotin 1000 MCG tablet Take 1,000  mcg by mouth daily.   Yes [provider]  carvedilol (COREG) 12.5 MG tablet TAKE 1 TABLET (12.5 MG TOTAL) 2 (TWO) TIMES DAILY WITH A MEAL BY MOUTH. 01/27/18  Yes Rutherford Guys, MD  colchicine 0.6 MG tablet Take 0.6 mg by mouth daily.   Yes [provider]  DULoxetine (CYMBALTA) 60 MG capsule TAKE 1 CAPSULE (60 MG TOTAL) DAILY BY MOUTH. 01/27/18  Yes Rutherford Guys, MD  ferrous sulfate 325 (65 FE) MG tablet TAKE 1 TABLET BY MOUTH EVERY DAY WITH BREAKFAST 02/26/18  Yes Rutherford Guys, MD  gabapentin (NEURONTIN) 100 MG capsule Take 1 capsule (100 mg) every morning and afternoon, and take 4 capsules (446m) at bedtime 04/03/18  Yes SRutherford Guys MD  hydrochlorothiazide (HYDRODIURIL) 25 MG tablet Take 0.5 tablets (12.5 mg total) by mouth daily. 01/24/18  Yes SRutherford Guys MD  Insulin Glargine (LANTUS SOLOSTAR) 100 UNIT/ML Solostar Pen Inject 26 Units into the skin daily at 10 pm. Patient taking differently: Inject 35 Units into the skin daily.  01/24/18  Yes SRutherford Guys MD  insulin lispro (HUMALOG) 100 UNIT/ML injection Inject 1-10 Units into the skin 3 (three) times daily before meals. Sliding scale   Yes [provider]  losartan (COZAAR) 25 MG tablet TAKE 1 TABLET BY MOUTH EVERY DAY 01/27/18  Yes SRutherford Guys MD  Multiple Vitamin (MULTIVITAMIN) tablet Take 1 tablet daily by mouth.   Yes [provider]  pantoprazole (PROTONIX) 40 MG tablet TAKE 1 TABLET (40 MG TOTAL) DAILY BY MOUTH. 01/27/18  Yes SRutherford Guys MD  prednisoLONE 5 MG TABS tablet Take 2.5 mg by mouth daily. For 2 weeks   Yes [provider]  ranitidine (ZANTAC) 150 MG capsule Take 1 capsule (150 mg total) at bedtime by mouth. 10/30/17  Yes SRutherford Guys MD  blood glucose meter kit and supplies KIT Dispense based on patient and insurance preference. Check capillary glucose three times a day, before meals.  Dx E11.65, Z79.4 10/29/17   SRutherford Guys MD  celecoxib (CELEBREX)  100 MG capsule Take 1 capsule (100 mg total) by mouth 2 (two) times daily. Patient not taking: Reported on 04/22/2018 04/02/18   SRutherford Guys MD  diclofenac sodium (VOLTAREN) 1 % GEL Apply 2 g topically 4 (four) times daily. Patient not taking: Reported on 04/22/2018 01/24/18   SRutherford Guys MD  glucose blood (ONE TOUCH ULTRA TEST) test strip USE TO CHECK GLUCOSE LEVELS 3 TIMES A DAY, DX E11.9, Z79.4 11/30/17   SRutherford Guys MD  Insulin Pen Needle (B-D ULTRAFINE III SHORT PEN) 31G X 8 MM MISC To use as directed with insulin, four times a day. Dx E11.9, Z79.4 11/08/17   SRutherford Guys MD  triamcinolone cream (KENALOG) 0.5 % Apply 1 application topically 2 (two) times daily. Patient  not taking: Reported on 04/22/2018 03/14/18   Rutherford Guys, MD    Family History Family History  Problem Relation Age of Onset  . Diabetes Mother   . Diabetes Father   . Heart disease Father   . Diabetes Sister   . Heart disease Son   . Colon cancer Neg Hx   . Colon polyps Neg Hx   . Rectal cancer Neg Hx   . Stomach cancer Neg Hx   . Esophageal cancer Neg Hx     Social History Social History   Tobacco Use  . Smoking status: Never Smoker  . Smokeless tobacco: Never Used  Substance Use Topics  . Alcohol use: No    Alcohol/week: 0.0 oz  . Drug use: No     Allergies   Asa [aspirin]; Tylenol [acetaminophen]; and Sulfa antibiotics   Review of Systems Review of Systems  Constitutional: Negative for chills and fever.  HENT: Negative for congestion, rhinorrhea, sinus pain and sore throat.   Eyes: Negative for visual disturbance.  Respiratory: Negative for cough, chest tightness and shortness of breath.   Cardiovascular: Negative for chest pain, palpitations and leg swelling.  Gastrointestinal: Negative for abdominal pain, constipation, diarrhea, nausea and vomiting.  Genitourinary: Negative for dysuria and flank pain.  Musculoskeletal: Negative for back pain and myalgias.  Skin:  Negative for rash.  Neurological: Positive for weakness. Negative for dizziness, syncope, light-headedness and headaches.       +Generalized weakness     Physical Exam Updated Vital Signs BP (!) 124/94   Pulse 73   Temp 98.5 F (36.9 C) (Oral)   Resp 16   SpO2 99%   Physical Exam  Constitutional: She appears well-developed and well-nourished. No distress.  Thin appearing elderly female.  HENT:  Head: Normocephalic and atraumatic.  Moist mucous membranes.  Eyes: Pupils are equal, round, and reactive to light. Conjunctivae and EOM are normal.  Neck: Normal range of motion. Neck supple.  Cardiovascular: Normal rate, regular rhythm, S1 normal and S2 normal.  No murmur heard. Pulmonary/Chest: Effort normal and breath sounds normal. She has no wheezes. She has no rales.  Abdominal: Soft. She exhibits no distension. There is no tenderness. There is no guarding.  Musculoskeletal: Normal range of motion. She exhibits no edema or deformity.  Lymphadenopathy:    She has no cervical adenopathy.  Neurological: She is alert.  Cranial nerves grossly intact. Patient moves extremities symmetrically and with good coordination. Normal and symmetric gait.  Skin: Skin is warm and dry. No rash noted. No erythema.  Psychiatric: She has a normal mood and affect. Her behavior is normal. Judgment and thought content normal.  Nursing note and vitals reviewed.    ED Treatments / Results  Labs (all labs ordered are listed, but only abnormal results are displayed) Labs Reviewed  CBG MONITORING, ED - Abnormal; Notable for the following components:      Result Value   Glucose-Capillary 447 (*)    All other components within normal limits  COMPREHENSIVE METABOLIC PANEL  TROPONIN I  CBC WITH DIFFERENTIAL/PLATELET  URINALYSIS, ROUTINE W REFLEX MICROSCOPIC    EKG EKG Interpretation  Date/Time:  Tuesday April 22 2018 14:20:42 EDT Ventricular Rate:  78 PR Interval:    QRS Duration: 146 QT  Interval:  440 QTC Calculation: 502 R Axis:   -83 Text Interpretation:  Atrial-sensed ventricular-paced rhythm No further analysis attempted due to paced rhythm no prior available to compare Confirmed by Aletta Edouard [phone]) on 04/22/2018 4:11:29 PM  Radiology No results found.  Procedures Procedures (including critical care time)  Medications Ordered in ED Medications  sodium chloride 0.9 % bolus 1,000 mL (has no administration in time range)     Initial Impression / Assessment and Plan / ED Course  I have reviewed the triage vital signs and the nursing notes.  Pertinent labs & imaging results that were available during my care of the patient were reviewed by me and considered in my medical decision making (see chart for details).  Clinical Course as of Apr 24 203  Tue Apr 23, 8235  2428 77 year old female diabetic on insulin here with elevated blood sugar.  She is recently been on prednisone for a polyarthralgia and this is likely what is been driving her high sugars.  She is complaining of some fatigue but otherwise no infectious symptoms.  We are checking some screening labs urine chest x-ray giving her some IV fluids.  Likely will be discharged.   [MB]  [place] with Dr. Nehemiah Settle of [facility], endocrinologist, who states that primary reason for patient's transfer to the emergency department was reported orthostasis at the facility.  Per Dr. Nehemiah Settle, such fluctuations of glucose are expected with prednisone, and she wishes patient to stay on therapy as prescribed, provided that her lab work is normal today, and there are no other clinical concerns.   [AM]    Clinical Course User Index [AM] Albesa Seen, PA-C [MB] Hayden Rasmussen, MD    Patient is nontoxic appearing, afebrile, and in no acute distress.  Suspect patient's hyperglycemia secondary to prednisone therapy, this patient has no focal symptoms to suggest infection as cause of elevated  glucose.  Examination without focal findings to suggest infectious etiologies.  Will obtain CMP, CBC, troponin, chest x-ray, urinalysis.  Per discussion with Dr. Nehemiah Settle, anticipate discharge home if patient has normal labs and improved blood pressures without evidence of orthostasis.  Patient apparently evaluated by Dr. Ronnald Ramp, who is in agreement with this plan of care.  Per discussions as above, patient to resume typical insulin regimen.  I discussed this with family.  Patient given 2 L of normal saline given evidence of orthostasis here in emergency department.  Discussed following up with primary care provider tomorrow to discuss further blood pressure management and changing orders given to nursing care facility in the setting of patient's orthostatic hypotension.  Discussed personally sending this note to primary care provider.  Patient given meal coverage with 10 units of insulin emergency department, less than sliding scale due to lowering glucose with normal saline concurrently.  Return precautions given to the patient and also in instructions to facilitate if any chest pain, shortness breath, neurologic changes, or other concerns about patient's blood sugar or blood pressure.   This is a shared visit with Dr. Aletta Edouard. Patient was independently evaluated by this attending physician. Attending physician consulted in evaluation and discharge management.  Final Clinical Impressions(s) / ED Diagnoses   Final diagnoses:  Hyperglycemia  Orthostatic hypotension    ED Discharge Orders    None       Tamala Julian 04/23/18 0207    Hayden Rasmussen, MD 04/23/18 1755

## 2018-04-22 NOTE — Discharge Instructions (Addendum)
Please resume the regularly scheduled insulin orders.  Please resume the sliding scale that is ordered by Dr. Nehemiah Settle.  Please check blood sugar this evening prior to bedtime.  Please resume all regular medications tomorrow.  Please return for any chest pain, shortness of breath, feeling that she is going to pass out, or passing out episodes, changes in mental status, generalized weakness or weakness focused on one side of the body, numbness on one side of the body, or changing neurologic status.   Please return for any other concerns.

## 2018-04-22 NOTE — ED Notes (Signed)
Bed: WA23 Expected date:  Expected time:  Means of arrival:  Comments: EMS hyperglycemia 

## 2018-04-24 ENCOUNTER — Other Ambulatory Visit: Payer: Self-pay

## 2018-04-24 ENCOUNTER — Encounter: Payer: Self-pay | Admitting: Family Medicine

## 2018-04-24 ENCOUNTER — Ambulatory Visit (INDEPENDENT_AMBULATORY_CARE_PROVIDER_SITE_OTHER): Payer: Medicare Other | Admitting: Family Medicine

## 2018-04-24 VITALS — Temp 98.1°F | Wt 110.6 lb

## 2018-04-24 DIAGNOSIS — E162 Hypoglycemia, unspecified: Secondary | ICD-10-CM

## 2018-04-24 DIAGNOSIS — I951 Orthostatic hypotension: Secondary | ICD-10-CM | POA: Diagnosis not present

## 2018-04-24 DIAGNOSIS — M1120 Other chondrocalcinosis, unspecified site: Secondary | ICD-10-CM

## 2018-04-24 DIAGNOSIS — M48061 Spinal stenosis, lumbar region without neurogenic claudication: Secondary | ICD-10-CM | POA: Diagnosis not present

## 2018-04-24 DIAGNOSIS — E1142 Type 2 diabetes mellitus with diabetic polyneuropathy: Secondary | ICD-10-CM | POA: Diagnosis not present

## 2018-04-24 DIAGNOSIS — M4307 Spondylolysis, lumbosacral region: Secondary | ICD-10-CM | POA: Diagnosis not present

## 2018-04-24 DIAGNOSIS — M159 Polyosteoarthritis, unspecified: Secondary | ICD-10-CM | POA: Diagnosis not present

## 2018-04-24 DIAGNOSIS — E1143 Type 2 diabetes mellitus with diabetic autonomic (poly)neuropathy: Secondary | ICD-10-CM | POA: Diagnosis not present

## 2018-04-24 DIAGNOSIS — K3184 Gastroparesis: Secondary | ICD-10-CM | POA: Diagnosis not present

## 2018-04-24 NOTE — Patient Instructions (Signed)
     IF you received an x-ray today, you will receive an invoice from [facility]. Please contact [facility] at [phone] with questions or concerns regarding your invoice.   IF you received labwork today, you will receive an invoice from [facility]. Please contact [facility] at [phone] with questions or concerns regarding your invoice.   Our billing staff will not be able to assist you with questions regarding bills from these companies.  You will be contacted with the lab results as soon as they are available. The fastest way to get your results is to activate your My Chart account. Instructions are located on the last page of this paperwork. If you have not heard from us regarding the results in 2 weeks, please contact this office.     

## 2018-04-24 NOTE — Progress Notes (Signed)
5/2/20191:34 PM  Judith [name] 05-18-1941, 77 y.o. female [id number]  Chief Complaint  Patient presents with  . Follow-up    ER follow up vist for hypotension    HPI:   Patient is a 77 y.o. female with past medical history significant for DM2 insulin dependent, HTN, gastroparesis, polyarthritis who presents today for ER followup  Patient moved into [facility] assisted living a week ago Likes it, engaged in social activities, doing well with regular diet, started doing PT She had recently been started on prednisone by rheum Her cbgs had been rising as expected with pred, on 4/30, cbgs > 400, PT did orthostatic BP as part of his initial assessment and found her to have a significant drop in her blood pressure They called her endocrinologist that advised ER given combination of factors In the ER patient was deemed to be sign dehydrated, no other etiology found  Patient is here with her daughter They bring in advance directives Patient reports feeling well Saw rheum - no PMR, pseudogout +/- OA, per patient's daughter. Weaning off pred. Started colchicine. Has not tried celebrex as on prednisone.  ER staff spoke with endo, no changes to insulin regime Currently on lantus 35 units, humalog 101-160 12 units, 161-200 13u, 201-250 14u, 251 -300 15u She has had 3 symptomatic episodes of hypoglycemia since then Last one was yesterday after dinner, down to 59, recovered well with orange juice. She reports she ate all her dinner  Daughter is requesting change to regular diet, as that is what patient has been eating and had no issues.  She will also be having cataract surgery in May 21st  Fall Risk  04/24/2018 04/03/2018 03/14/2018 02/27/2018 02/18/2018  Falls in the past year? Yes Yes Yes No Yes  Number falls in past yr: 2 or more 2 or more 1 - 1  Injury with Fall? No No No - Yes  Comment - - - - -  Risk Factor Category  - High Fall Risk - - -  Risk for fall due to : - - - - -  Follow up - - -  - -     Depression screen [facility] 2/9 04/03/2018 03/14/2018 02/27/2018  Decreased Interest 0 0 0  Down, Depressed, Hopeless 0 0 0  PHQ - 2 Score 0 0 0  Altered sleeping - - -  Tired, decreased energy - - -  Change in appetite - - -  Feeling bad or failure about yourself  - - -  Trouble concentrating - - -  Moving slowly or fidgety/restless - - -  Suicidal thoughts - - -  PHQ-9 Score - - -  Difficult doing work/chores - - -    Allergies  Allergen Reactions  . Asa [Aspirin] Other (See Comments)    Causes ulcers   . Tylenol [Acetaminophen]     Intolerance due to liver damage  . Sulfa Antibiotics Rash    Prior to Admission medications   Medication Sig Start Date End Date Taking? Authorizing Provider  Biotin 1000 MCG tablet Take 1,000 mcg by mouth daily.    [provider]  blood glucose meter kit and supplies KIT Dispense based on patient and insurance preference. Check capillary glucose three times a day, before meals.  Dx E11.65, Z79.4 10/29/17   Rutherford Guys, MD  carvedilol (COREG) 12.5 MG tablet TAKE 1 TABLET (12.5 MG TOTAL) 2 (TWO) TIMES DAILY WITH A MEAL BY MOUTH. 01/27/18   Rutherford Guys, MD  celecoxib (  CELEBREX) 100 MG capsule Take 1 capsule (100 mg total) by mouth 2 (two) times daily. Patient not taking: Reported on 04/22/2018 04/02/18   Rutherford Guys, MD  colchicine 0.6 MG tablet Take 0.6 mg by mouth daily.    [provider]  diclofenac sodium (VOLTAREN) 1 % GEL Apply 2 g topically 4 (four) times daily. Patient not taking: Reported on 04/22/2018 01/24/18   Rutherford Guys, MD  DULoxetine (CYMBALTA) 60 MG capsule TAKE 1 CAPSULE (60 MG TOTAL) DAILY BY MOUTH. 01/27/18   Rutherford Guys, MD  ferrous sulfate 325 (65 FE) MG tablet TAKE 1 TABLET BY MOUTH EVERY DAY WITH BREAKFAST 02/26/18   Rutherford Guys, MD  gabapentin (NEURONTIN) 100 MG capsule Take 1 capsule (100 mg) every morning and afternoon, and take 4 capsules (438m) at bedtime 04/03/18   SRutherford Guys  MD  glucose blood (ONE TOUCH ULTRA TEST) test strip USE TO CHECK GLUCOSE LEVELS 3 TIMES A DAY, DX E11.9, Z79.4 11/30/17   SRutherford Guys MD  hydrochlorothiazide (HYDRODIURIL) 25 MG tablet Take 0.5 tablets (12.5 mg total) by mouth daily. 01/24/18   SRutherford Guys MD  Insulin Glargine (LANTUS SOLOSTAR) 100 UNIT/ML Solostar Pen Inject 26 Units into the skin daily at 10 pm. Patient taking differently: Inject 35 Units into the skin daily.  01/24/18   SRutherford Guys MD  insulin lispro (HUMALOG) 100 UNIT/ML injection Inject 1-10 Units into the skin 3 (three) times daily before meals. Sliding scale    [provider]  Insulin Pen Needle (B-D ULTRAFINE III SHORT PEN) 31G X 8 MM MISC To use as directed with insulin, four times a day. Dx E11.9, Z79.4 11/08/17   SRutherford Guys MD  losartan (COZAAR) 25 MG tablet TAKE 1 TABLET BY MOUTH EVERY DAY 01/27/18   SRutherford Guys MD  Multiple Vitamin (MULTIVITAMIN) tablet Take 1 tablet daily by mouth.    [provider]  pantoprazole (PROTONIX) 40 MG tablet TAKE 1 TABLET (40 MG TOTAL) DAILY BY MOUTH. 01/27/18   SRutherford Guys MD  prednisoLONE 5 MG TABS tablet Take 2.5 mg by mouth daily. For 2 weeks    [provider]  ranitidine (ZANTAC) 150 MG capsule Take 1 capsule (150 mg total) at bedtime by mouth. 10/30/17   SRutherford Guys MD  triamcinolone cream (KENALOG) 0.5 % Apply 1 application topically 2 (two) times daily. Patient not taking: Reported on 04/22/2018 03/14/18   SRutherford Guys MD    Past Medical History:  Diagnosis Date  . Anemia   . Arthritis   . Biliary gastritis   . Blood transfusion without reported diagnosis    2007  . Depression   . Diabetes mellitus type 2 with complications ([facility]   . Duodenal ulcer disease   . Encounter for long-term ([place]) use of insulin ([place]   . Gastric ulcer   . Gastroparesis    severe  . GERD (gastroesophageal reflux disease)   . Gout    tested for gout but was not  . Heart  murmur   . Hyperlipidemia    in past not now  . Hypertension   . Hypokalemia, gastrointestinal losses    secondary to severe gastroparesis  . Osteoporosis    just had bone density test possible   . Peripheral neuropathy   . Presence of cardiac pacemaker   . S/P partial gastrectomy    due to severe gastric and gudoenal ulcers  . Seasonal allergies   .  Sleep apnea    could not use CPAP  . UTI (urinary tract infection)     Past Surgical History:  Procedure Laterality Date  . COLONOSCOPY  02/11/2018   no polyps, + small internal hemorrhoids  . EUS N/A 12/12/2017   Procedure: UPPER ENDOSCOPIC ULTRASOUND (EUS) RADIAL;  Surgeon: [name], MD;  Location: [facility];  Service: Endoscopy;  Laterality: N/A;  . GASTRECTOMY    . JOINT REPLACEMENT    . pace maker    . PARTIAL GASTRECTOMY    . TONSILLECTOMY     45 years ago  . UPPER GASTROINTESTINAL ENDOSCOPY      Social History   Tobacco Use  . Smoking status: Never Smoker  . Smokeless tobacco: Never Used  Substance Use Topics  . Alcohol use: No    Alcohol/week: 0.0 oz    Family History  Problem Relation Age of Onset  . Diabetes Mother   . Diabetes Father   . Heart disease Father   . Diabetes Sister   . Heart disease Son   . Colon cancer Neg Hx   . Colon polyps Neg Hx   . Rectal cancer Neg Hx   . Stomach cancer Neg Hx   . Esophageal cancer Neg Hx     Review of Systems  Constitutional: Negative for chills and fever.  Respiratory: Negative for cough and shortness of breath.   Cardiovascular: Negative for chest pain, palpitations and leg swelling.  Gastrointestinal: Negative for abdominal pain, nausea and vomiting.  Musculoskeletal: Positive for falls and joint pain.  Neurological: Positive for dizziness. Negative for loss of consciousness.     OBJECTIVE:  Temperature 98.1 F (36.7 C), temperature source Oral, weight 110 lb 9.6 oz (50.2 kg), SpO2 97 %. BP lying 124/74, 86 Standing @ 1 min, 110/76, HR  86 Standing @ 3 min, 118/50, HR 86  Wt Readings from Last 3 Encounters:  04/24/18 110 lb 9.6 oz (50.2 kg)  04/03/18 108 lb (49 kg)  04/01/18 105 lb (47.6 kg)    Physical Exam  Constitutional: She is oriented to person, place, and time.  Judith [name], chronically ill appearing  HENT:  Head: Normocephalic and atraumatic.  Mouth/Throat: Oropharynx is clear and moist. No oropharyngeal exudate.  Eyes: Pupils are equal, round, and reactive to light. EOM are normal. No scleral icterus.  Neck: Neck supple.  Cardiovascular: Normal rate, regular rhythm and normal heart sounds. Exam reveals no gallop and no friction rub.  No murmur heard. Pulmonary/Chest: Effort normal and breath sounds normal. She has no wheezes. She has no rales.  Musculoskeletal: She exhibits no edema.  Neurological: She is alert and oriented to person, place, and time.  Uses 4W walker with seat  Skin: Skin is warm and dry.  Psychiatric: She has a normal mood and affect.  Nursing note and vitals reviewed.   ASSESSMENT and PLAN  1. Orthostatic hypotension Patient still presents with DBP orthostatics, therefore will DC HCTZ. I have asked that PT continues to monitor BP and safety.   2. Hypoglycemia Patient with several episodes of recent of hypoglycemia, discussed management. I have asked that patient address with endocrinology.   3. Pseudogout Per rheum, on colchicine, joint pain and swelling improved.   OK to do regular diet, patient is aware of avoidance of simple sugars/carbs.   Return in about 6 weeks (around 06/05/2018).    Rutherford Guys, MD [facility] [place], [place] [address] Ph.  [phone] Fax [phone]

## 2018-04-26 ENCOUNTER — Other Ambulatory Visit: Payer: Self-pay | Admitting: Family Medicine

## 2018-04-28 DIAGNOSIS — E1142 Type 2 diabetes mellitus with diabetic polyneuropathy: Secondary | ICD-10-CM | POA: Diagnosis not present

## 2018-04-28 DIAGNOSIS — K3184 Gastroparesis: Secondary | ICD-10-CM | POA: Diagnosis not present

## 2018-04-28 DIAGNOSIS — M4307 Spondylolysis, lumbosacral region: Secondary | ICD-10-CM | POA: Diagnosis not present

## 2018-04-28 DIAGNOSIS — M159 Polyosteoarthritis, unspecified: Secondary | ICD-10-CM | POA: Diagnosis not present

## 2018-04-28 DIAGNOSIS — M48061 Spinal stenosis, lumbar region without neurogenic claudication: Secondary | ICD-10-CM | POA: Diagnosis not present

## 2018-04-28 DIAGNOSIS — E1143 Type 2 diabetes mellitus with diabetic autonomic (poly)neuropathy: Secondary | ICD-10-CM | POA: Diagnosis not present

## 2018-04-29 ENCOUNTER — Encounter: Payer: Self-pay | Admitting: Family Medicine

## 2018-04-29 DIAGNOSIS — M48061 Spinal stenosis, lumbar region without neurogenic claudication: Secondary | ICD-10-CM | POA: Diagnosis not present

## 2018-04-29 DIAGNOSIS — E1142 Type 2 diabetes mellitus with diabetic polyneuropathy: Secondary | ICD-10-CM | POA: Diagnosis not present

## 2018-04-29 DIAGNOSIS — M4307 Spondylolysis, lumbosacral region: Secondary | ICD-10-CM | POA: Diagnosis not present

## 2018-04-29 DIAGNOSIS — E1143 Type 2 diabetes mellitus with diabetic autonomic (poly)neuropathy: Secondary | ICD-10-CM | POA: Diagnosis not present

## 2018-04-29 DIAGNOSIS — K3184 Gastroparesis: Secondary | ICD-10-CM | POA: Diagnosis not present

## 2018-04-29 DIAGNOSIS — M159 Polyosteoarthritis, unspecified: Secondary | ICD-10-CM | POA: Diagnosis not present

## 2018-04-30 DIAGNOSIS — M4307 Spondylolysis, lumbosacral region: Secondary | ICD-10-CM | POA: Diagnosis not present

## 2018-04-30 DIAGNOSIS — M48061 Spinal stenosis, lumbar region without neurogenic claudication: Secondary | ICD-10-CM | POA: Diagnosis not present

## 2018-04-30 DIAGNOSIS — M159 Polyosteoarthritis, unspecified: Secondary | ICD-10-CM | POA: Diagnosis not present

## 2018-04-30 DIAGNOSIS — E1143 Type 2 diabetes mellitus with diabetic autonomic (poly)neuropathy: Secondary | ICD-10-CM | POA: Diagnosis not present

## 2018-04-30 DIAGNOSIS — K3184 Gastroparesis: Secondary | ICD-10-CM | POA: Diagnosis not present

## 2018-04-30 DIAGNOSIS — E1142 Type 2 diabetes mellitus with diabetic polyneuropathy: Secondary | ICD-10-CM | POA: Diagnosis not present

## 2018-05-02 DIAGNOSIS — E1142 Type 2 diabetes mellitus with diabetic polyneuropathy: Secondary | ICD-10-CM | POA: Diagnosis not present

## 2018-05-02 DIAGNOSIS — M159 Polyosteoarthritis, unspecified: Secondary | ICD-10-CM | POA: Diagnosis not present

## 2018-05-02 DIAGNOSIS — K3184 Gastroparesis: Secondary | ICD-10-CM | POA: Diagnosis not present

## 2018-05-02 DIAGNOSIS — M48061 Spinal stenosis, lumbar region without neurogenic claudication: Secondary | ICD-10-CM | POA: Diagnosis not present

## 2018-05-02 DIAGNOSIS — M4307 Spondylolysis, lumbosacral region: Secondary | ICD-10-CM | POA: Diagnosis not present

## 2018-05-02 DIAGNOSIS — E1143 Type 2 diabetes mellitus with diabetic autonomic (poly)neuropathy: Secondary | ICD-10-CM | POA: Diagnosis not present

## 2018-05-05 DIAGNOSIS — M48061 Spinal stenosis, lumbar region without neurogenic claudication: Secondary | ICD-10-CM | POA: Diagnosis not present

## 2018-05-05 DIAGNOSIS — M159 Polyosteoarthritis, unspecified: Secondary | ICD-10-CM | POA: Diagnosis not present

## 2018-05-05 DIAGNOSIS — E1143 Type 2 diabetes mellitus with diabetic autonomic (poly)neuropathy: Secondary | ICD-10-CM | POA: Diagnosis not present

## 2018-05-05 DIAGNOSIS — K3184 Gastroparesis: Secondary | ICD-10-CM | POA: Diagnosis not present

## 2018-05-05 DIAGNOSIS — E1142 Type 2 diabetes mellitus with diabetic polyneuropathy: Secondary | ICD-10-CM | POA: Diagnosis not present

## 2018-05-05 DIAGNOSIS — M4307 Spondylolysis, lumbosacral region: Secondary | ICD-10-CM | POA: Diagnosis not present

## 2018-05-07 DIAGNOSIS — R748 Abnormal levels of other serum enzymes: Secondary | ICD-10-CM | POA: Diagnosis not present

## 2018-05-07 DIAGNOSIS — M4307 Spondylolysis, lumbosacral region: Secondary | ICD-10-CM | POA: Diagnosis not present

## 2018-05-07 DIAGNOSIS — E1142 Type 2 diabetes mellitus with diabetic polyneuropathy: Secondary | ICD-10-CM | POA: Diagnosis not present

## 2018-05-07 DIAGNOSIS — M25572 Pain in left ankle and joints of left foot: Secondary | ICD-10-CM | POA: Diagnosis not present

## 2018-05-07 DIAGNOSIS — M25512 Pain in left shoulder: Secondary | ICD-10-CM | POA: Diagnosis not present

## 2018-05-07 DIAGNOSIS — M1990 Unspecified osteoarthritis, unspecified site: Secondary | ICD-10-CM | POA: Diagnosis not present

## 2018-05-07 DIAGNOSIS — E1143 Type 2 diabetes mellitus with diabetic autonomic (poly)neuropathy: Secondary | ICD-10-CM | POA: Diagnosis not present

## 2018-05-07 DIAGNOSIS — E119 Type 2 diabetes mellitus without complications: Secondary | ICD-10-CM | POA: Diagnosis not present

## 2018-05-07 DIAGNOSIS — K3184 Gastroparesis: Secondary | ICD-10-CM | POA: Diagnosis not present

## 2018-05-07 DIAGNOSIS — M25531 Pain in right wrist: Secondary | ICD-10-CM | POA: Diagnosis not present

## 2018-05-07 DIAGNOSIS — M48061 Spinal stenosis, lumbar region without neurogenic claudication: Secondary | ICD-10-CM | POA: Diagnosis not present

## 2018-05-07 DIAGNOSIS — K219 Gastro-esophageal reflux disease without esophagitis: Secondary | ICD-10-CM | POA: Diagnosis not present

## 2018-05-07 DIAGNOSIS — M1189 Other specified crystal arthropathies, multiple sites: Secondary | ICD-10-CM | POA: Diagnosis not present

## 2018-05-07 DIAGNOSIS — M25431 Effusion, right wrist: Secondary | ICD-10-CM | POA: Diagnosis not present

## 2018-05-07 DIAGNOSIS — R768 Other specified abnormal immunological findings in serum: Secondary | ICD-10-CM | POA: Diagnosis not present

## 2018-05-07 DIAGNOSIS — E0865 Diabetes mellitus due to underlying condition with hyperglycemia: Secondary | ICD-10-CM | POA: Diagnosis not present

## 2018-05-07 DIAGNOSIS — E0841 Diabetes mellitus due to underlying condition with diabetic mononeuropathy: Secondary | ICD-10-CM | POA: Diagnosis not present

## 2018-05-07 DIAGNOSIS — M159 Polyosteoarthritis, unspecified: Secondary | ICD-10-CM | POA: Diagnosis not present

## 2018-05-08 DIAGNOSIS — M159 Polyosteoarthritis, unspecified: Secondary | ICD-10-CM | POA: Diagnosis not present

## 2018-05-08 DIAGNOSIS — E1143 Type 2 diabetes mellitus with diabetic autonomic (poly)neuropathy: Secondary | ICD-10-CM | POA: Diagnosis not present

## 2018-05-08 DIAGNOSIS — E1142 Type 2 diabetes mellitus with diabetic polyneuropathy: Secondary | ICD-10-CM | POA: Diagnosis not present

## 2018-05-08 DIAGNOSIS — K3184 Gastroparesis: Secondary | ICD-10-CM | POA: Diagnosis not present

## 2018-05-08 DIAGNOSIS — M4307 Spondylolysis, lumbosacral region: Secondary | ICD-10-CM | POA: Diagnosis not present

## 2018-05-08 DIAGNOSIS — M48061 Spinal stenosis, lumbar region without neurogenic claudication: Secondary | ICD-10-CM | POA: Diagnosis not present

## 2018-05-12 DIAGNOSIS — K3184 Gastroparesis: Secondary | ICD-10-CM | POA: Diagnosis not present

## 2018-05-12 DIAGNOSIS — E1142 Type 2 diabetes mellitus with diabetic polyneuropathy: Secondary | ICD-10-CM | POA: Diagnosis not present

## 2018-05-12 DIAGNOSIS — M4307 Spondylolysis, lumbosacral region: Secondary | ICD-10-CM | POA: Diagnosis not present

## 2018-05-12 DIAGNOSIS — M48061 Spinal stenosis, lumbar region without neurogenic claudication: Secondary | ICD-10-CM | POA: Diagnosis not present

## 2018-05-12 DIAGNOSIS — M159 Polyosteoarthritis, unspecified: Secondary | ICD-10-CM | POA: Diagnosis not present

## 2018-05-12 DIAGNOSIS — E1143 Type 2 diabetes mellitus with diabetic autonomic (poly)neuropathy: Secondary | ICD-10-CM | POA: Diagnosis not present

## 2018-05-13 ENCOUNTER — Telehealth: Payer: Self-pay | Admitting: Family Medicine

## 2018-05-13 NOTE — Telephone Encounter (Signed)
Phone call to [place] RN, Judith [name] to follow up on blood sugars. . She states she saw patient yesterday.  Judith [name] states, "She was fine. I don't know if she was getting in to something in her room, or eating whatever she wants to." She states patient has a history of elevated blood sugar readings with prednisone. Judith [name] states "She [name]) never complains with any symptoms of high or low blood sugar."   Judith [name] states Judith [name] has been holding her short time acting insulin if her blood sugar is less than 150, have been holding novolog. She states Judith [name] has orders they are supposed to call with every blood sugar that's over 400, not sure if this has been happening.  Phone call to [place], spoke with [place]. She states that Judith [name] has been in contact with Dr. Garnet Koyanagi, patient's endocrinologist with high CBG's and getting orders. [place] states patient just completed prednisone. She states, "We talk to them just about everyday.". Fasting Blood sugars were 274 this morning. 337 before Lunch. Just got 15 units of novolog. Patient has not made mention of any symptoms.   Dr. Pamella Pert, Juluis Rainier.

## 2018-05-13 NOTE — Telephone Encounter (Signed)
Copied from [place] [id number]. Topic: Inquiry >> May 12, 2018  4:08 PM Judith [name] wrote: Reason for CRM: [facility] called to notify pcp blood sugar was 470, contact [facility] @ [phone] if needed

## 2018-05-14 ENCOUNTER — Encounter: Payer: Self-pay | Admitting: Family Medicine

## 2018-05-14 DIAGNOSIS — L249 Irritant contact dermatitis, unspecified cause: Secondary | ICD-10-CM

## 2018-05-15 ENCOUNTER — Ambulatory Visit (INDEPENDENT_AMBULATORY_CARE_PROVIDER_SITE_OTHER): Payer: Medicare Other | Admitting: *Deleted

## 2018-05-15 ENCOUNTER — Telehealth: Payer: Self-pay | Admitting: Family Medicine

## 2018-05-15 DIAGNOSIS — I442 Atrioventricular block, complete: Secondary | ICD-10-CM

## 2018-05-15 DIAGNOSIS — E1142 Type 2 diabetes mellitus with diabetic polyneuropathy: Secondary | ICD-10-CM | POA: Diagnosis not present

## 2018-05-15 DIAGNOSIS — M4307 Spondylolysis, lumbosacral region: Secondary | ICD-10-CM | POA: Diagnosis not present

## 2018-05-15 DIAGNOSIS — Z950 Presence of cardiac pacemaker: Secondary | ICD-10-CM

## 2018-05-15 DIAGNOSIS — E1143 Type 2 diabetes mellitus with diabetic autonomic (poly)neuropathy: Secondary | ICD-10-CM | POA: Diagnosis not present

## 2018-05-15 DIAGNOSIS — M48061 Spinal stenosis, lumbar region without neurogenic claudication: Secondary | ICD-10-CM | POA: Diagnosis not present

## 2018-05-15 DIAGNOSIS — K3184 Gastroparesis: Secondary | ICD-10-CM | POA: Diagnosis not present

## 2018-05-15 DIAGNOSIS — M159 Polyosteoarthritis, unspecified: Secondary | ICD-10-CM | POA: Diagnosis not present

## 2018-05-15 LAB — CUP PACEART INCLINIC DEVICE CHECK
Brady Statistic RA Percent Paced: 2 %
Brady Statistic RV Percent Paced: 99 %
Implantable Lead Implant Date: 20110617
Implantable Lead Location: 753860
Implantable Lead Model: 4136
Implantable Lead Model: 4469
Implantable Lead Serial Number: 484167
Lead Channel Impedance Value: 550 Ohm
Lead Channel Pacing Threshold Amplitude: 0.7 V
Lead Channel Pacing Threshold Pulse Width: 0.4 ms
Lead Channel Pacing Threshold Pulse Width: 0.4 ms
Lead Channel Sensing Intrinsic Amplitude: 1.4 mV
Lead Channel Setting Pacing Amplitude: 1.3 V
MDC IDC LEAD IMPLANT DT: 20070701
MDC IDC LEAD LOCATION: 753859
MDC IDC LEAD SERIAL: 28708376
MDC IDC MSMT LEADCHNL RA PACING THRESHOLD AMPLITUDE: 0.7 V
MDC IDC MSMT LEADCHNL RV IMPEDANCE VALUE: 550 Ohm
MDC IDC MSMT LEADCHNL RV SENSING INTR AMPL: 12 mV — AB
MDC IDC PG IMPLANT DT: 20110617
MDC IDC SESS DTM: 20190523040000
MDC IDC SET LEADCHNL RA PACING AMPLITUDE: 2 V
MDC IDC SET LEADCHNL RV PACING PULSEWIDTH: 0.4 ms
MDC IDC SET LEADCHNL RV SENSING SENSITIVITY: 2 mV
Pulse Gen Serial Number: 157425

## 2018-05-15 NOTE — Telephone Encounter (Signed)
Copied from [place] [id number]. Topic: Quick Communication - See Telephone Encounter >> May 15, 2018  4:13 PM Ether Griffins B wrote: CRM for notification. See Telephone encounter for: 05/15/18.  Monique with [name] calling for Verbal ok to continue PT 2 x 4 for balance, strength, home safety and home exercise program. CB# [phone]

## 2018-05-15 NOTE — Progress Notes (Signed)
Pacemaker check in clinic. Normal device function. Thresholds, sensing, impedances consistent with previous measurements. Device programmed to maximize longevity. No mode switch or high ventricular rates noted. Device programmed at appropriate safety margins. Histogram distribution appropriate for patient activity level. Device programmed to optimize intrinsic conduction. Estimated longevity 3.5 years. Patient education completed. ROV with [facility] in 10/2018.

## 2018-05-15 NOTE — Patient Instructions (Signed)
Your physician wants you to follow-up in: November 2019 with Dr. Sallyanne Kuster. You will receive a reminder letter in the mail two months in advance. If you don't receive a letter, please call our office to schedule the follow-up appointment.

## 2018-05-16 MED ORDER — DIPHENHYDRAMINE HCL 25 MG PO CAPS: 25.0000 mg | ORAL_CAPSULE | Freq: Every day | ORAL | 0 refills | Status: DC | PRN

## 2018-05-16 NOTE — Telephone Encounter (Signed)
Rx faxed to [facility]

## 2018-05-20 DIAGNOSIS — M159 Polyosteoarthritis, unspecified: Secondary | ICD-10-CM | POA: Diagnosis not present

## 2018-05-20 DIAGNOSIS — E1142 Type 2 diabetes mellitus with diabetic polyneuropathy: Secondary | ICD-10-CM | POA: Diagnosis not present

## 2018-05-20 DIAGNOSIS — M4307 Spondylolysis, lumbosacral region: Secondary | ICD-10-CM | POA: Diagnosis not present

## 2018-05-20 DIAGNOSIS — M48061 Spinal stenosis, lumbar region without neurogenic claudication: Secondary | ICD-10-CM | POA: Diagnosis not present

## 2018-05-20 DIAGNOSIS — K3184 Gastroparesis: Secondary | ICD-10-CM | POA: Diagnosis not present

## 2018-05-20 DIAGNOSIS — E1143 Type 2 diabetes mellitus with diabetic autonomic (poly)neuropathy: Secondary | ICD-10-CM | POA: Diagnosis not present

## 2018-05-22 DIAGNOSIS — E1143 Type 2 diabetes mellitus with diabetic autonomic (poly)neuropathy: Secondary | ICD-10-CM | POA: Diagnosis not present

## 2018-05-22 DIAGNOSIS — E1142 Type 2 diabetes mellitus with diabetic polyneuropathy: Secondary | ICD-10-CM | POA: Diagnosis not present

## 2018-05-22 DIAGNOSIS — M159 Polyosteoarthritis, unspecified: Secondary | ICD-10-CM | POA: Diagnosis not present

## 2018-05-22 DIAGNOSIS — M48061 Spinal stenosis, lumbar region without neurogenic claudication: Secondary | ICD-10-CM | POA: Diagnosis not present

## 2018-05-22 DIAGNOSIS — K3184 Gastroparesis: Secondary | ICD-10-CM | POA: Diagnosis not present

## 2018-05-22 DIAGNOSIS — M4307 Spondylolysis, lumbosacral region: Secondary | ICD-10-CM | POA: Diagnosis not present

## 2018-05-23 DIAGNOSIS — E1142 Type 2 diabetes mellitus with diabetic polyneuropathy: Secondary | ICD-10-CM | POA: Diagnosis not present

## 2018-05-23 DIAGNOSIS — E1143 Type 2 diabetes mellitus with diabetic autonomic (poly)neuropathy: Secondary | ICD-10-CM | POA: Diagnosis not present

## 2018-05-23 DIAGNOSIS — M48061 Spinal stenosis, lumbar region without neurogenic claudication: Secondary | ICD-10-CM | POA: Diagnosis not present

## 2018-05-23 DIAGNOSIS — M159 Polyosteoarthritis, unspecified: Secondary | ICD-10-CM | POA: Diagnosis not present

## 2018-05-23 DIAGNOSIS — K3184 Gastroparesis: Secondary | ICD-10-CM | POA: Diagnosis not present

## 2018-05-23 DIAGNOSIS — M4307 Spondylolysis, lumbosacral region: Secondary | ICD-10-CM | POA: Diagnosis not present

## 2018-05-26 DIAGNOSIS — E1142 Type 2 diabetes mellitus with diabetic polyneuropathy: Secondary | ICD-10-CM | POA: Diagnosis not present

## 2018-05-26 DIAGNOSIS — M48061 Spinal stenosis, lumbar region without neurogenic claudication: Secondary | ICD-10-CM | POA: Diagnosis not present

## 2018-05-26 DIAGNOSIS — M159 Polyosteoarthritis, unspecified: Secondary | ICD-10-CM | POA: Diagnosis not present

## 2018-05-26 DIAGNOSIS — E1143 Type 2 diabetes mellitus with diabetic autonomic (poly)neuropathy: Secondary | ICD-10-CM | POA: Diagnosis not present

## 2018-05-26 DIAGNOSIS — M4307 Spondylolysis, lumbosacral region: Secondary | ICD-10-CM | POA: Diagnosis not present

## 2018-05-26 DIAGNOSIS — K3184 Gastroparesis: Secondary | ICD-10-CM | POA: Diagnosis not present

## 2018-05-27 DIAGNOSIS — M159 Polyosteoarthritis, unspecified: Secondary | ICD-10-CM | POA: Diagnosis not present

## 2018-05-27 DIAGNOSIS — K3184 Gastroparesis: Secondary | ICD-10-CM | POA: Diagnosis not present

## 2018-05-27 DIAGNOSIS — E1143 Type 2 diabetes mellitus with diabetic autonomic (poly)neuropathy: Secondary | ICD-10-CM | POA: Diagnosis not present

## 2018-05-27 DIAGNOSIS — M48061 Spinal stenosis, lumbar region without neurogenic claudication: Secondary | ICD-10-CM | POA: Diagnosis not present

## 2018-05-27 DIAGNOSIS — M4307 Spondylolysis, lumbosacral region: Secondary | ICD-10-CM | POA: Diagnosis not present

## 2018-05-27 DIAGNOSIS — E1142 Type 2 diabetes mellitus with diabetic polyneuropathy: Secondary | ICD-10-CM | POA: Diagnosis not present

## 2018-05-29 DIAGNOSIS — M159 Polyosteoarthritis, unspecified: Secondary | ICD-10-CM | POA: Diagnosis not present

## 2018-05-29 DIAGNOSIS — M48061 Spinal stenosis, lumbar region without neurogenic claudication: Secondary | ICD-10-CM | POA: Diagnosis not present

## 2018-05-29 DIAGNOSIS — K3184 Gastroparesis: Secondary | ICD-10-CM | POA: Diagnosis not present

## 2018-05-29 DIAGNOSIS — E1142 Type 2 diabetes mellitus with diabetic polyneuropathy: Secondary | ICD-10-CM | POA: Diagnosis not present

## 2018-05-29 DIAGNOSIS — E1143 Type 2 diabetes mellitus with diabetic autonomic (poly)neuropathy: Secondary | ICD-10-CM | POA: Diagnosis not present

## 2018-05-29 DIAGNOSIS — M4307 Spondylolysis, lumbosacral region: Secondary | ICD-10-CM | POA: Diagnosis not present

## 2018-06-02 DIAGNOSIS — M159 Polyosteoarthritis, unspecified: Secondary | ICD-10-CM | POA: Diagnosis not present

## 2018-06-02 DIAGNOSIS — E1143 Type 2 diabetes mellitus with diabetic autonomic (poly)neuropathy: Secondary | ICD-10-CM | POA: Diagnosis not present

## 2018-06-02 DIAGNOSIS — M48061 Spinal stenosis, lumbar region without neurogenic claudication: Secondary | ICD-10-CM | POA: Diagnosis not present

## 2018-06-02 DIAGNOSIS — K3184 Gastroparesis: Secondary | ICD-10-CM | POA: Diagnosis not present

## 2018-06-02 DIAGNOSIS — E1142 Type 2 diabetes mellitus with diabetic polyneuropathy: Secondary | ICD-10-CM | POA: Diagnosis not present

## 2018-06-02 DIAGNOSIS — M4307 Spondylolysis, lumbosacral region: Secondary | ICD-10-CM | POA: Diagnosis not present

## 2018-06-03 DIAGNOSIS — K3184 Gastroparesis: Secondary | ICD-10-CM | POA: Diagnosis not present

## 2018-06-03 DIAGNOSIS — M4307 Spondylolysis, lumbosacral region: Secondary | ICD-10-CM | POA: Diagnosis not present

## 2018-06-03 DIAGNOSIS — M159 Polyosteoarthritis, unspecified: Secondary | ICD-10-CM | POA: Diagnosis not present

## 2018-06-03 DIAGNOSIS — M48061 Spinal stenosis, lumbar region without neurogenic claudication: Secondary | ICD-10-CM | POA: Diagnosis not present

## 2018-06-03 DIAGNOSIS — E1142 Type 2 diabetes mellitus with diabetic polyneuropathy: Secondary | ICD-10-CM | POA: Diagnosis not present

## 2018-06-03 DIAGNOSIS — E1143 Type 2 diabetes mellitus with diabetic autonomic (poly)neuropathy: Secondary | ICD-10-CM | POA: Diagnosis not present

## 2018-06-05 ENCOUNTER — Ambulatory Visit (INDEPENDENT_AMBULATORY_CARE_PROVIDER_SITE_OTHER): Payer: Medicare Other | Admitting: Family Medicine

## 2018-06-05 ENCOUNTER — Other Ambulatory Visit: Payer: Self-pay

## 2018-06-05 ENCOUNTER — Encounter: Payer: Self-pay | Admitting: Family Medicine

## 2018-06-05 VITALS — BP 128/72 | HR 100 | Temp 98.5°F | Ht 63.98 in | Wt 113.2 lb

## 2018-06-05 DIAGNOSIS — L853 Xerosis cutis: Secondary | ICD-10-CM

## 2018-06-05 DIAGNOSIS — L249 Irritant contact dermatitis, unspecified cause: Secondary | ICD-10-CM

## 2018-06-05 NOTE — Progress Notes (Signed)
6/13/20199:56 AM  Judith [name] Feb 15, 1941, 77 y.o. female [id number]  Chief Complaint  Patient presents with  . Rash    has rash on body that is vey itchy.    HPI:   Patient is a 77 y.o. female with past medical history significant for  DM2 insulin dependent, HTN, gastroparesis, polyarthritis, gastric ulcers who presents today for rash  Ongoing Around her waistline, back and hips Thought to be related to depends, has changed back to cotton underwear and pads Worse at night Using a back scratcher Bathes twice a week, per assisted living regulations, uses body wash Takes benadryl at night, helps some Has not been moisturizing nor using triamcinolone as instructed Patient wondering about referral to derm that was requested via mychart - referral sent on 05/28/18  Fall Risk  06/05/2018 04/24/2018 04/03/2018 03/14/2018 02/27/2018  Falls in the past year? No Yes Yes Yes No  Number falls in past yr: - 2 or more 2 or more 1 -  Injury with Fall? - No No No -  Comment - - - - -  Risk Factor Category  - - High Fall Risk - -  Risk for fall due to : - - - - -  Follow up - - - - -     Depression screen [facility] 2/9 06/05/2018 04/03/2018 03/14/2018  Decreased Interest 0 0 0  Down, Depressed, Hopeless 0 0 0  PHQ - 2 Score 0 0 0  Altered sleeping - - -  Tired, decreased energy - - -  Change in appetite - - -  Feeling bad or failure about yourself  - - -  Trouble concentrating - - -  Moving slowly or fidgety/restless - - -  Suicidal thoughts - - -  PHQ-9 Score - - -  Difficult doing work/chores - - -    Allergies  Allergen Reactions  . Asa [Aspirin] Other (See Comments)    Causes ulcers   . Tylenol [Acetaminophen]     Intolerance due to liver damage  . Sulfa Antibiotics Rash    Prior to Admission medications   Medication Sig Start Date End Date Taking? Authorizing Provider  Biotin 1000 MCG tablet Take 1,000 mcg by mouth daily.   Yes [provider]  blood glucose meter kit and  supplies KIT Dispense based on patient and insurance preference. Check capillary glucose three times a day, before meals.  Dx E11.65, Z79.4 10/29/17  Yes Rutherford Guys, MD  carvedilol (COREG) 12.5 MG tablet TAKE 1 TABLET (12.5 MG TOTAL) 2 (TWO) TIMES DAILY WITH A MEAL BY MOUTH. 04/28/18  Yes Rutherford Guys, MD  colchicine 0.6 MG tablet Take 0.6 mg by mouth daily.   Yes [provider]  diclofenac sodium (VOLTAREN) 1 % GEL Apply 2 g topically 4 (four) times daily. 01/24/18  Yes Rutherford Guys, MD  diphenhydrAMINE (BENADRYL) 25 mg capsule Take 1 capsule (25 mg total) by mouth daily as needed for itching. 05/16/18  Yes Rutherford Guys, MD  DULoxetine (CYMBALTA) 60 MG capsule TAKE 1 CAPSULE (60 MG TOTAL) DAILY BY MOUTH. 04/28/18  Yes Rutherford Guys, MD  ferrous sulfate 325 (65 FE) MG tablet TAKE 1 TABLET BY MOUTH EVERY DAY WITH BREAKFAST 02/26/18  Yes Rutherford Guys, MD  gabapentin (NEURONTIN) 100 MG capsule Take 1 capsule (100 mg) every morning and afternoon, and take 4 capsules (425m) at bedtime 04/03/18  Yes SRutherford Guys MD  glucose blood (ONE TOUCH ULTRA TEST) test strip  USE TO CHECK GLUCOSE LEVELS 3 TIMES A DAY, DX E11.9, Z79.4 11/30/17  Yes Rutherford Guys, MD  hydrochlorothiazide (HYDRODIURIL) 25 MG tablet Take 12.5 mg by mouth daily.   Yes [provider]  Insulin Glargine (LANTUS SOLOSTAR) 100 UNIT/ML Solostar Pen Inject 26 Units into the skin daily at 10 pm. Patient taking differently: Inject 35 Units into the skin daily.  01/24/18  Yes Rutherford Guys, MD  insulin lispro (HUMALOG) 100 UNIT/ML injection Inject 1-10 Units into the skin 3 (three) times daily before meals. Sliding scale   Yes [provider]  Insulin Pen Needle (B-D ULTRAFINE III SHORT PEN) 31G X 8 MM MISC To use as directed with insulin, four times a day. Dx E11.9, Z79.4 11/08/17  Yes Rutherford Guys, MD  losartan (COZAAR) 25 MG tablet TAKE 1 TABLET BY MOUTH EVERY DAY 01/27/18  Yes Rutherford Guys,  MD  Multiple Vitamin (MULTIVITAMIN) tablet Take 1 tablet daily by mouth.   Yes [provider]  pantoprazole (PROTONIX) 40 MG tablet TAKE 1 TABLET (40 MG TOTAL) DAILY BY MOUTH. 04/28/18  Yes Rutherford Guys, MD  ranitidine (ZANTAC) 150 MG capsule Take 1 capsule (150 mg total) at bedtime by mouth. 10/30/17  Yes Rutherford Guys, MD  triamcinolone cream (KENALOG) 0.5 % Apply 1 application topically 2 (two) times daily. 03/14/18  Yes Rutherford Guys, MD    Past Medical History:  Diagnosis Date  . Anemia   . Arthritis   . Biliary gastritis   . Blood transfusion without reported diagnosis    2007  . Depression   . Diabetes mellitus type 2 with complications ([place])   . Duodenal ulcer disease   . Encounter for long-term (current) use of insulin ([place])   . Gastric ulcer   . Gastroparesis    severe  . GERD (gastroesophageal reflux disease)   . Gout    tested for gout but was not  . Heart murmur   . Hyperlipidemia    in past not now  . Hypertension   . Hypokalemia, gastrointestinal losses    secondary to severe gastroparesis  . Osteoporosis    just had bone density test possible   . Peripheral neuropathy   . Presence of cardiac pacemaker   . S/P partial gastrectomy    due to severe gastric and gudoenal ulcers  . Seasonal allergies   . Sleep apnea    could not use CPAP  . UTI (urinary tract infection)     Past Surgical History:  Procedure Laterality Date  . COLONOSCOPY  02/11/2018   no polyps, + small internal hemorrhoids  . EUS N/A 12/12/2017   Procedure: UPPER ENDOSCOPIC ULTRASOUND (EUS) RADIAL;  Surgeon: [name], MD;  Location: [facility];  Service: Endoscopy;  Laterality: N/A;  . GASTRECTOMY    . JOINT REPLACEMENT    . pace maker    . PARTIAL GASTRECTOMY    . TONSILLECTOMY     45 years ago  . UPPER GASTROINTESTINAL ENDOSCOPY      Social History   Tobacco Use  . Smoking status: Never Smoker  . Smokeless tobacco: Never Used  Substance Use Topics    . Alcohol use: No    Alcohol/week: 0.0 oz    Family History  Problem Relation Age of Onset  . Diabetes Mother   . Diabetes Father   . Heart disease Father   . Diabetes Sister   . Heart disease Son   . Colon cancer  Neg Hx   . Colon polyps Neg Hx   . Rectal cancer Neg Hx   . Stomach cancer Neg Hx   . Esophageal cancer Neg Hx     ROS Per hpi  OBJECTIVE:  Blood pressure 128/72, pulse 100, temperature 98.5 F (36.9 C), temperature source Oral, height 5' 3.98" (1.625 m), weight 113 lb 3.2 oz (51.3 kg), SpO2 98 %.  Physical Exam  Gen: AAOx3, NAD Skin; very dry and thin skin, areas of excoriations along waistline, low back and upper hips. No new rash noted.   ASSESSMENT and PLAN  1. Irritant contact dermatitis, unspecified trigger 2. Dry skin Discussed skin care, itch-scratch cycle, use of triamcinolone.  Return in about 3 months (around 09/05/2018).    Rutherford Guys, MD Primary Care at [facility] [place], [place] [address] Ph.  [phone] Fax [phone]

## 2018-06-05 NOTE — Patient Instructions (Addendum)
1. Referral has been sent to dermatology 2. Mix in petroleum jelly into your lotion and apply twice a day 3. Continue with benadryl at bedtime 4. Start triamcinolone twice a day 5. We discussed changing your body soap to aveeno, dove or cetaphil, no frangance 6.. Do not scratch    IF you received an x-ray today, you will receive an invoice from [facility]. Please contact [facility] at [phone] with questions or concerns regarding your invoice.   IF you received labwork today, you will receive an invoice from [facility]. Please contact [facility] at [phone] with questions or concerns regarding your invoice.   Our billing staff will not be able to assist you with questions regarding bills from these companies.  You will be contacted with the lab results as soon as they are available. The fastest way to get your results is to activate your My Chart account. Instructions are located on the last page of this paperwork. If you have not heard from [place] regarding the results in 2 weeks, please contact this office.

## 2018-06-06 ENCOUNTER — Telehealth: Payer: Self-pay | Admitting: Family Medicine

## 2018-06-06 DIAGNOSIS — M48061 Spinal stenosis, lumbar region without neurogenic claudication: Secondary | ICD-10-CM | POA: Diagnosis not present

## 2018-06-06 DIAGNOSIS — M4307 Spondylolysis, lumbosacral region: Secondary | ICD-10-CM | POA: Diagnosis not present

## 2018-06-06 DIAGNOSIS — K3184 Gastroparesis: Secondary | ICD-10-CM | POA: Diagnosis not present

## 2018-06-06 DIAGNOSIS — E1142 Type 2 diabetes mellitus with diabetic polyneuropathy: Secondary | ICD-10-CM | POA: Diagnosis not present

## 2018-06-06 DIAGNOSIS — M159 Polyosteoarthritis, unspecified: Secondary | ICD-10-CM | POA: Diagnosis not present

## 2018-06-06 DIAGNOSIS — E1143 Type 2 diabetes mellitus with diabetic autonomic (poly)neuropathy: Secondary | ICD-10-CM | POA: Diagnosis not present

## 2018-06-06 NOTE — Telephone Encounter (Signed)
Copied from [place] [phone]. Topic: Quick Communication - See Telephone Encounter >> Jun 06, 2018 12:11 PM Percell Belt A wrote: CRM for notification. See Telephone encounter for: 06/06/18.  Rashedia with [facility]  She called in and stated that every order that they rec'd needs to be clarified? She would like CMA to give her a call to talk about orders. She is also going to fax over all orders to office

## 2018-06-09 NOTE — Telephone Encounter (Signed)
Judith [name] Is calling back from [employer] and states she is needing clarification on triamcinolone cream (KENALOG) 0.5 %.   Please contact. [phone]

## 2018-06-10 NOTE — Telephone Encounter (Signed)
Faxed back order

## 2018-06-11 DIAGNOSIS — M4307 Spondylolysis, lumbosacral region: Secondary | ICD-10-CM | POA: Diagnosis not present

## 2018-06-11 DIAGNOSIS — E1142 Type 2 diabetes mellitus with diabetic polyneuropathy: Secondary | ICD-10-CM | POA: Diagnosis not present

## 2018-06-11 DIAGNOSIS — M48061 Spinal stenosis, lumbar region without neurogenic claudication: Secondary | ICD-10-CM | POA: Diagnosis not present

## 2018-06-11 DIAGNOSIS — M159 Polyosteoarthritis, unspecified: Secondary | ICD-10-CM | POA: Diagnosis not present

## 2018-06-11 DIAGNOSIS — E1143 Type 2 diabetes mellitus with diabetic autonomic (poly)neuropathy: Secondary | ICD-10-CM | POA: Diagnosis not present

## 2018-06-11 DIAGNOSIS — K3184 Gastroparesis: Secondary | ICD-10-CM | POA: Diagnosis not present

## 2018-06-13 DIAGNOSIS — E1143 Type 2 diabetes mellitus with diabetic autonomic (poly)neuropathy: Secondary | ICD-10-CM | POA: Diagnosis not present

## 2018-06-13 DIAGNOSIS — E1142 Type 2 diabetes mellitus with diabetic polyneuropathy: Secondary | ICD-10-CM | POA: Diagnosis not present

## 2018-06-13 DIAGNOSIS — M4307 Spondylolysis, lumbosacral region: Secondary | ICD-10-CM | POA: Diagnosis not present

## 2018-06-13 DIAGNOSIS — M159 Polyosteoarthritis, unspecified: Secondary | ICD-10-CM | POA: Diagnosis not present

## 2018-06-13 DIAGNOSIS — K3184 Gastroparesis: Secondary | ICD-10-CM | POA: Diagnosis not present

## 2018-06-13 DIAGNOSIS — M48061 Spinal stenosis, lumbar region without neurogenic claudication: Secondary | ICD-10-CM | POA: Diagnosis not present

## 2018-06-16 DIAGNOSIS — M159 Polyosteoarthritis, unspecified: Secondary | ICD-10-CM | POA: Diagnosis not present

## 2018-06-16 DIAGNOSIS — E1143 Type 2 diabetes mellitus with diabetic autonomic (poly)neuropathy: Secondary | ICD-10-CM | POA: Diagnosis not present

## 2018-06-16 DIAGNOSIS — M48061 Spinal stenosis, lumbar region without neurogenic claudication: Secondary | ICD-10-CM | POA: Diagnosis not present

## 2018-06-16 DIAGNOSIS — E1142 Type 2 diabetes mellitus with diabetic polyneuropathy: Secondary | ICD-10-CM | POA: Diagnosis not present

## 2018-06-16 DIAGNOSIS — M4307 Spondylolysis, lumbosacral region: Secondary | ICD-10-CM | POA: Diagnosis not present

## 2018-06-16 DIAGNOSIS — K3184 Gastroparesis: Secondary | ICD-10-CM | POA: Diagnosis not present

## 2018-06-17 DIAGNOSIS — H25812 Combined forms of age-related cataract, left eye: Secondary | ICD-10-CM | POA: Diagnosis not present

## 2018-06-17 DIAGNOSIS — H2512 Age-related nuclear cataract, left eye: Secondary | ICD-10-CM | POA: Diagnosis not present

## 2018-06-19 DIAGNOSIS — K3184 Gastroparesis: Secondary | ICD-10-CM | POA: Diagnosis not present

## 2018-06-19 DIAGNOSIS — M48061 Spinal stenosis, lumbar region without neurogenic claudication: Secondary | ICD-10-CM | POA: Diagnosis not present

## 2018-06-19 DIAGNOSIS — M159 Polyosteoarthritis, unspecified: Secondary | ICD-10-CM | POA: Diagnosis not present

## 2018-06-19 DIAGNOSIS — E1142 Type 2 diabetes mellitus with diabetic polyneuropathy: Secondary | ICD-10-CM | POA: Diagnosis not present

## 2018-06-19 DIAGNOSIS — E1143 Type 2 diabetes mellitus with diabetic autonomic (poly)neuropathy: Secondary | ICD-10-CM | POA: Diagnosis not present

## 2018-06-19 DIAGNOSIS — M4307 Spondylolysis, lumbosacral region: Secondary | ICD-10-CM | POA: Diagnosis not present

## 2018-06-27 DIAGNOSIS — E119 Type 2 diabetes mellitus without complications: Secondary | ICD-10-CM | POA: Diagnosis not present

## 2018-07-02 DIAGNOSIS — R748 Abnormal levels of other serum enzymes: Secondary | ICD-10-CM | POA: Diagnosis not present

## 2018-07-02 DIAGNOSIS — E0841 Diabetes mellitus due to underlying condition with diabetic mononeuropathy: Secondary | ICD-10-CM | POA: Diagnosis not present

## 2018-07-02 DIAGNOSIS — K3184 Gastroparesis: Secondary | ICD-10-CM | POA: Diagnosis not present

## 2018-07-02 DIAGNOSIS — E0865 Diabetes mellitus due to underlying condition with hyperglycemia: Secondary | ICD-10-CM | POA: Diagnosis not present

## 2018-07-14 DIAGNOSIS — H2511 Age-related nuclear cataract, right eye: Secondary | ICD-10-CM | POA: Diagnosis not present

## 2018-07-14 DIAGNOSIS — H25011 Cortical age-related cataract, right eye: Secondary | ICD-10-CM | POA: Diagnosis not present

## 2018-07-14 DIAGNOSIS — H35352 Cystoid macular degeneration, left eye: Secondary | ICD-10-CM | POA: Diagnosis not present

## 2018-07-17 ENCOUNTER — Encounter (INDEPENDENT_AMBULATORY_CARE_PROVIDER_SITE_OTHER): Payer: Medicare Other | Admitting: Ophthalmology

## 2018-07-17 DIAGNOSIS — E11311 Type 2 diabetes mellitus with unspecified diabetic retinopathy with macular edema: Secondary | ICD-10-CM

## 2018-07-17 DIAGNOSIS — I10 Essential (primary) hypertension: Secondary | ICD-10-CM | POA: Diagnosis not present

## 2018-07-17 DIAGNOSIS — H43813 Vitreous degeneration, bilateral: Secondary | ICD-10-CM

## 2018-07-17 DIAGNOSIS — E113313 Type 2 diabetes mellitus with moderate nonproliferative diabetic retinopathy with macular edema, bilateral: Secondary | ICD-10-CM | POA: Diagnosis not present

## 2018-07-17 DIAGNOSIS — H2511 Age-related nuclear cataract, right eye: Secondary | ICD-10-CM | POA: Diagnosis not present

## 2018-07-17 DIAGNOSIS — H35033 Hypertensive retinopathy, bilateral: Secondary | ICD-10-CM

## 2018-08-12 DIAGNOSIS — E119 Type 2 diabetes mellitus without complications: Secondary | ICD-10-CM | POA: Diagnosis not present

## 2018-08-12 DIAGNOSIS — R768 Other specified abnormal immunological findings in serum: Secondary | ICD-10-CM | POA: Diagnosis not present

## 2018-08-12 DIAGNOSIS — Z79899 Other long term (current) drug therapy: Secondary | ICD-10-CM | POA: Diagnosis not present

## 2018-08-12 DIAGNOSIS — M1990 Unspecified osteoarthritis, unspecified site: Secondary | ICD-10-CM | POA: Diagnosis not present

## 2018-08-12 DIAGNOSIS — K219 Gastro-esophageal reflux disease without esophagitis: Secondary | ICD-10-CM | POA: Diagnosis not present

## 2018-08-12 DIAGNOSIS — M25431 Effusion, right wrist: Secondary | ICD-10-CM | POA: Diagnosis not present

## 2018-08-12 DIAGNOSIS — M1189 Other specified crystal arthropathies, multiple sites: Secondary | ICD-10-CM | POA: Diagnosis not present

## 2018-08-14 ENCOUNTER — Encounter (INDEPENDENT_AMBULATORY_CARE_PROVIDER_SITE_OTHER): Payer: Medicare Other | Admitting: Ophthalmology

## 2018-08-14 DIAGNOSIS — H43813 Vitreous degeneration, bilateral: Secondary | ICD-10-CM

## 2018-08-14 DIAGNOSIS — H35033 Hypertensive retinopathy, bilateral: Secondary | ICD-10-CM

## 2018-08-14 DIAGNOSIS — E113312 Type 2 diabetes mellitus with moderate nonproliferative diabetic retinopathy with macular edema, left eye: Secondary | ICD-10-CM

## 2018-08-14 DIAGNOSIS — E11311 Type 2 diabetes mellitus with unspecified diabetic retinopathy with macular edema: Secondary | ICD-10-CM | POA: Diagnosis not present

## 2018-08-14 DIAGNOSIS — I10 Essential (primary) hypertension: Secondary | ICD-10-CM | POA: Diagnosis not present

## 2018-08-14 DIAGNOSIS — E113211 Type 2 diabetes mellitus with mild nonproliferative diabetic retinopathy with macular edema, right eye: Secondary | ICD-10-CM | POA: Diagnosis not present

## 2018-09-11 ENCOUNTER — Ambulatory Visit: Payer: Medicare Other | Admitting: Family Medicine

## 2018-09-22 ENCOUNTER — Ambulatory Visit (INDEPENDENT_AMBULATORY_CARE_PROVIDER_SITE_OTHER): Payer: Medicare Other | Admitting: Family Medicine

## 2018-09-22 ENCOUNTER — Ambulatory Visit (INDEPENDENT_AMBULATORY_CARE_PROVIDER_SITE_OTHER): Payer: Medicare Other

## 2018-09-22 ENCOUNTER — Encounter: Payer: Self-pay | Admitting: Family Medicine

## 2018-09-22 ENCOUNTER — Other Ambulatory Visit: Payer: Self-pay

## 2018-09-22 VITALS — BP 150/62 | HR 70 | Temp 98.1°F | Resp 18 | Ht 63.19 in | Wt 125.2 lb

## 2018-09-22 DIAGNOSIS — L03113 Cellulitis of right upper limb: Secondary | ICD-10-CM

## 2018-09-22 DIAGNOSIS — E118 Type 2 diabetes mellitus with unspecified complications: Secondary | ICD-10-CM

## 2018-09-22 DIAGNOSIS — M79631 Pain in right forearm: Secondary | ICD-10-CM | POA: Diagnosis not present

## 2018-09-22 DIAGNOSIS — M7989 Other specified soft tissue disorders: Secondary | ICD-10-CM | POA: Diagnosis not present

## 2018-09-22 DIAGNOSIS — Z23 Encounter for immunization: Secondary | ICD-10-CM | POA: Diagnosis not present

## 2018-09-22 DIAGNOSIS — I10 Essential (primary) hypertension: Secondary | ICD-10-CM | POA: Diagnosis not present

## 2018-09-22 DIAGNOSIS — Z794 Long term (current) use of insulin: Secondary | ICD-10-CM | POA: Diagnosis not present

## 2018-09-22 DIAGNOSIS — E162 Hypoglycemia, unspecified: Secondary | ICD-10-CM | POA: Diagnosis not present

## 2018-09-22 MED ORDER — CEPHALEXIN 500 MG PO CAPS: 500.0000 mg | ORAL_CAPSULE | Freq: Four times a day (QID) | ORAL | 0 refills | Status: DC

## 2018-09-22 MED ORDER — LOSARTAN POTASSIUM 50 MG PO TABS: 50.0000 mg | ORAL_TABLET | Freq: Every day | ORAL | 1 refills | Status: DC

## 2018-09-22 MED ORDER — TRIAMCINOLONE ACETONIDE 0.5 % EX CREA: 1.0000 "application " | TOPICAL_CREAM | Freq: Two times a day (BID) | CUTANEOUS | 1 refills | Status: DC

## 2018-09-22 NOTE — Patient Instructions (Signed)
° ° ° °  If you have lab work done today you will be contacted with your lab results within the next 2 weeks.  If you have not heard from us then please contact us. The fastest way to get your results is to register for My Chart. ° ° °IF you received an x-ray today, you will receive an invoice from [facility]. Please contact [facility] at [phone] with questions or concerns regarding your invoice.  ° °IF you received labwork today, you will receive an invoice from [facility]. Please contact [facility] at [phone] with questions or concerns regarding your invoice.  ° °Our billing staff will not be able to assist you with questions regarding bills from these companies. ° °You will be contacted with the lab results as soon as they are available. The fastest way to get your results is to activate your My Chart account. Instructions are located on the last page of this paperwork. If you have not heard from us regarding the results in 2 weeks, please contact this office. °  ° ° ° °

## 2018-09-22 NOTE — Progress Notes (Signed)
9/30/20192:21 PM  Judith [name] 1941-07-11, 77 y.o. female [id number]  Chief Complaint  Patient presents with  . Dermatitis    f/u    HPI:   Patient is a 77 y.o. female with past medical history significant for DM2 insulin dependent, HTN, gastroparesis, polyarthritis, gastric ulcers who presents today for routine follow up  Continues to see endo  Continues to have lows into the 50s, tends to happen when you get 9 units and premeal cbg is closer to 100.  lantus 22 units Current ISS:  80 - 180: 9 units 181 - 220: 10 units 221 - 260: 11 units 261 - 300: 12 units 301 - 500: 13 units Does not see Dr Nehemiah Settle for several months  Seeing retina specialist due to DM2 retinopathy  Has had 2 episodes of vomiting, undigested food from dinner Has skipped dinner couple of times due to feeling full Normal BM  Left arm 10 days ago swollen, red and warm Denies any trauma, bites  Rash is doing much better  Fall Risk  09/22/2018 06/05/2018 04/24/2018 04/03/2018 03/14/2018  Falls in the past year? Yes No Yes Yes Yes  Number falls in past yr: 1 - 2 or more 2 or more 1  Injury with Fall? No - No No No  Comment - - - - -  Risk Factor Category  - - - High Fall Risk -  Risk for fall due to : - - - - -  Follow up - - - - -     Depression screen [facility] 2/9 09/22/2018 06/05/2018 04/03/2018  Decreased Interest 0 0 0  Down, Depressed, Hopeless 0 0 0  PHQ - 2 Score 0 0 0  Altered sleeping - - -  Tired, decreased energy - - -  Change in appetite - - -  Feeling bad or failure about yourself  - - -  Trouble concentrating - - -  Moving slowly or fidgety/restless - - -  Suicidal thoughts - - -  PHQ-9 Score - - -  Difficult doing work/chores - - -    Allergies  Allergen Reactions  . Asa [Aspirin] Other (See Comments)    Causes ulcers   . Tylenol [Acetaminophen]     Intolerance due to liver damage  . Sulfa Antibiotics Rash    Prior to Admission medications   Medication Sig Start Date End Date  Taking? Authorizing Provider  Biotin 1000 MCG tablet Take 1,000 mcg by mouth daily.   Yes [provider]  blood glucose meter kit and supplies KIT Dispense based on patient and insurance preference. Check capillary glucose three times a day, before meals.  Dx E11.65, Z79.4 10/29/17  Yes Rutherford Guys, MD  carvedilol (COREG) 12.5 MG tablet TAKE 1 TABLET (12.5 MG TOTAL) 2 (TWO) TIMES DAILY WITH A MEAL BY MOUTH. 04/28/18  Yes Rutherford Guys, MD  colchicine 0.6 MG tablet Take 0.6 mg by mouth daily.   Yes [provider]  diphenhydrAMINE (BENADRYL) 25 mg capsule Take 1 capsule (25 mg total) by mouth daily as needed for itching. 05/16/18  Yes Rutherford Guys, MD  DULoxetine (CYMBALTA) 60 MG capsule TAKE 1 CAPSULE (60 MG TOTAL) DAILY BY MOUTH. 04/28/18  Yes Rutherford Guys, MD  ferrous sulfate 325 (65 FE) MG tablet TAKE 1 TABLET BY MOUTH EVERY DAY WITH BREAKFAST 02/26/18  Yes Rutherford Guys, MD  gabapentin (NEURONTIN) 100 MG capsule Take 1 capsule (100 mg) every morning and afternoon, and take 4  capsules (475m) at bedtime 04/03/18  Yes SRutherford Guys MD  glucose blood (ONE TOUCH ULTRA TEST) test strip USE TO CHECK GLUCOSE LEVELS 3 TIMES A DAY, DX E11.9, Z79.4 11/30/17  Yes SRutherford Guys MD  hydrochlorothiazide (HYDRODIURIL) 25 MG tablet Take 12.5 mg by mouth daily.   Yes [provider]  Insulin Glargine (LANTUS SOLOSTAR) 100 UNIT/ML Solostar Pen Inject 26 Units into the skin daily at 10 pm. Patient taking differently: Inject 35 Units into the skin daily.  01/24/18  Yes SRutherford Guys MD  insulin lispro (HUMALOG) 100 UNIT/ML injection Inject 1-10 Units into the skin 3 (three) times daily before meals. Sliding scale   Yes [provider]  Insulin Pen Needle (B-D ULTRAFINE III SHORT PEN) 31G X 8 MM MISC To use as directed with insulin, four times a day. Dx E11.9, Z79.4 11/08/17  Yes SRutherford Guys MD  losartan (COZAAR) 25 MG tablet TAKE 1 TABLET BY MOUTH EVERY  DAY 01/27/18  Yes SRutherford Guys MD  Multiple Vitamin (MULTIVITAMIN) tablet Take 1 tablet daily by mouth.   Yes [provider]  pantoprazole (PROTONIX) 40 MG tablet TAKE 1 TABLET (40 MG TOTAL) DAILY BY MOUTH. 04/28/18  Yes SRutherford Guys MD  ranitidine (ZANTAC) 150 MG capsule Take 1 capsule (150 mg total) at bedtime by mouth. 10/30/17  Yes SRutherford Guys MD  diclofenac sodium (VOLTAREN) 1 % GEL Apply 2 g topically 4 (four) times daily. Patient not taking: Reported on 09/22/2018 01/24/18   SRutherford Guys MD  triamcinolone cream (KENALOG) 0.5 % Apply 1 application topically 2 (two) times daily. Patient not taking: Reported on 09/22/2018 03/14/18   SRutherford Guys MD    Past Medical History:  Diagnosis Date  . Anemia   . Arthritis   . Biliary gastritis   . Blood transfusion without reported diagnosis    2007  . Depression   . Diabetes [place] 2 with complications [place]   . Duodenal ulcer disease   . [place] for long[place]term [place]) use of insulin [place]   . Gastric ulcer   . Gastroparesis    severe  . GERD (gastroesophageal reflux disease)   . Gout    tested for gout but was not  . Heart murmur   . Hyperlipidemia    in past not now  . Hypertension   . Hypokalemia, gastrointestinal losses    secondary to severe gastroparesis  . Osteoporosis    just had bone density test possible   . Peripheral neuropathy   . Presence of cardiac pacemaker   . S/P partial gastrectomy    due to severe gastric and gudoenal ulcers  . Seasonal allergies   . Sleep apnea    could not use CPAP  . UTI (urinary tract infection)     Past Surgical History:  Procedure Laterality Date  . COLONOSCOPY  02/11/2018   no polyps, + small internal hemorrhoids  . EUS N/A 12/12/2017   Procedure: UPPER ENDOSCOPIC ULTRASOUND (EUS) RADIAL;  Surgeon: JMilus Banister MD;  Location: [facility];  Service: Endoscopy;  Laterality: N/A;  . GASTRECTOMY    . JOINT REPLACEMENT    . pace maker      . PARTIAL GASTRECTOMY    . TONSILLECTOMY     45 years ago  . UPPER GASTROINTESTINAL ENDOSCOPY      Social History   Tobacco Use  . Smoking status: Never Smoker  . Smokeless tobacco: Never Used  Substance Use  Topics  . Alcohol use: No    Alcohol/week: 0.0 standard drinks    Family History  Problem Relation Age of Onset  . Diabetes Mother   . Diabetes Father   . Heart disease Father   . Diabetes Sister   . Heart disease Son   . Colon cancer Neg Hx   . Colon polyps Neg Hx   . Rectal cancer Neg Hx   . Stomach cancer Neg Hx   . Esophageal cancer Neg Hx     Review of Systems  Constitutional: Negative for chills and fever.  Respiratory: Negative for cough and shortness of breath.   Cardiovascular: Negative for chest pain, palpitations and leg swelling.  Gastrointestinal: Positive for vomiting. Negative for abdominal pain and nausea.    OBJECTIVE:  Blood pressure (!) 152/68, pulse 70, temperature 98.1 F (36.7 C), temperature source Oral, resp. rate 18, height 5' 3.19" (1.605 m), weight 125 lb 3.2 oz (56.8 kg), SpO2 96 %. Body mass index is 22.05 kg/m.   Wt Readings from Last 3 Encounters:  09/22/18 125 lb 3.2 oz (56.8 kg)  06/05/18 113 lb 3.2 oz (51.3 kg)  04/24/18 110 lb 9.6 oz (50.2 kg)    Physical Exam  Constitutional: She is oriented to person, place, and time. She appears well-developed and well-nourished.  HENT:  Head: Normocephalic and atraumatic.  Mouth/Throat: Oropharynx is clear and moist. No oropharyngeal exudate.  Eyes: Pupils are equal, round, and reactive to light. Conjunctivae and EOM are normal. No scleral icterus.  Neck: Neck supple.  Cardiovascular: Normal rate, regular rhythm and normal heart sounds. Exam reveals no gallop and no friction rub.  No murmur heard. Pulmonary/Chest: Effort normal and breath sounds normal. She has no wheezes. She has no rales.  Musculoskeletal: She exhibits no edema.  Neurological: She is alert and oriented to  person, place, and time.  Skin: Skin is warm and dry. There is erythema (right forearm with erythema, warmth and TTP along distal extensor side ).  Psychiatric: She has a normal mood and affect.  Nursing note and vitals reviewed.    Dg Forearm Right  Result Date: 09/22/2018 CLINICAL DATA:  77 year old female with right forearm pain and swelling with no known injury. EXAM: RIGHT FOREARM - 2 VIEW COMPARISON:  Right wrist series 01/24/2018. FINDINGS: Bone mineralization is within normal limits for age. The right radius and ulna appear intact. No fracture or dislocation identified. Degenerative changes at the wrist including chondrocalcinosis. Generalized soft tissue swelling and increased density. No discrete soft tissue lesion. No radiopaque foreign body identified. IMPRESSION: No acute osseous abnormality identified. Generalized nonspecific soft tissue swelling query cellulitis. Electronically Signed   By: Genevie Ann M.D.   On: 09/22/2018 14:49     ASSESSMENT and PLAN  1. Cellulitis of right upper extremity New. rx keflex.discussed supportive measures and RTC precautions 2. Right forearm pain - DG Forearm Right; Future  3. Essential hypertension Uncontrolled. Increasing losartan to 65m daily - Care order/instruction: - Comprehensive metabolic panel  4. Hypoglycemia  Recurring. Assisted living has not contacted endo and patient does not see for several months. Decreasing ISS to 81-149: 5 units, 150-180: 9 units. The rest remains the same.make appt with endo  5. Type 2 diabetes mellitus with complication, with long-term current use of insulin ([place] Managed by endo. Adjust part of ISS given recurring hypoglycemia. Discussed importance of following gastroparesis diet.  - Comprehensive metabolic panel  6. Need for influenza vaccination - Flu vaccine HIGH DOSE PF (Fluzone High  dose)  Other orders - triamcinolone cream (KENALOG) 0.5 %; Apply 1 application topically 2 (two) times daily. -  losartan (COZAAR) 50 MG tablet; Take 1 tablet (50 mg total) by mouth daily. - cephALEXin (KEFLEX) 500 MG capsule; Take 1 capsule (500 mg total) by mouth 4 (four) times daily.    Return in about 3 months (around 12/22/2018) for chronic medical conditions.    Rutherford Guys, MD Primary Care at [facility] [place], [place] [address] Ph.  [phone] Fax [phone]

## 2018-09-23 ENCOUNTER — Encounter (INDEPENDENT_AMBULATORY_CARE_PROVIDER_SITE_OTHER): Payer: Medicare Other | Admitting: Ophthalmology

## 2018-09-23 DIAGNOSIS — H2511 Age-related nuclear cataract, right eye: Secondary | ICD-10-CM

## 2018-09-23 DIAGNOSIS — I10 Essential (primary) hypertension: Secondary | ICD-10-CM | POA: Diagnosis not present

## 2018-09-23 DIAGNOSIS — H35033 Hypertensive retinopathy, bilateral: Secondary | ICD-10-CM | POA: Diagnosis not present

## 2018-09-23 DIAGNOSIS — E11311 Type 2 diabetes mellitus with unspecified diabetic retinopathy with macular edema: Secondary | ICD-10-CM

## 2018-09-23 DIAGNOSIS — E113211 Type 2 diabetes mellitus with mild nonproliferative diabetic retinopathy with macular edema, right eye: Secondary | ICD-10-CM

## 2018-09-23 DIAGNOSIS — E113312 Type 2 diabetes mellitus with moderate nonproliferative diabetic retinopathy with macular edema, left eye: Secondary | ICD-10-CM | POA: Diagnosis not present

## 2018-09-23 DIAGNOSIS — H43813 Vitreous degeneration, bilateral: Secondary | ICD-10-CM

## 2018-09-23 LAB — COMPREHENSIVE METABOLIC PANEL
ALT: 9 IU/L (ref 0–32)
AST: 22 IU/L (ref 0–40)
Albumin/Globulin Ratio: 1.8 (ref 1.2–2.2)
Albumin: 3.6 g/dL (ref 3.5–4.8)
Alkaline Phosphatase: 99 IU/L (ref 39–117)
BUN/Creatinine Ratio: 19 (ref 12–28)
BUN: 15 mg/dL (ref 8–27)
Bilirubin Total: 0.2 mg/dL (ref 0.0–1.2)
CO2: 20 mmol/L (ref 20–29)
Calcium: 8.7 mg/dL (ref 8.7–10.3)
Chloride: 104 mmol/L (ref 96–106)
Creatinine, Ser: 0.78 mg/dL (ref 0.57–1.00)
GFR calc Af Amer: 84 mL/min/{1.73_m2} (ref >59)
GFR calc non Af Amer: 73 mL/min/{1.73_m2} (ref >59)
Globulin, Total: 2 g/dL (ref 1.5–4.5)
Glucose: 116 mg/dL — ABNORMAL HIGH (ref 65–99)
Potassium: 4.2 mmol/L (ref 3.5–5.2)
Sodium: 141 mmol/L (ref 134–144)
Total Protein: 5.6 g/dL — ABNORMAL LOW (ref 6.0–8.5)

## 2018-09-24 ENCOUNTER — Telehealth: Payer: Self-pay | Admitting: Family Medicine

## 2018-09-24 NOTE — Telephone Encounter (Signed)
Spoke with RN [place] - she needs paperwork faxed back - pt unable to get meds until they receive fax.   Given to [employer] to get signature today - Brewing technologist for [place].

## 2018-09-24 NOTE — Telephone Encounter (Signed)
Copied from [place] [phone]. Topic: General - Other >> Sep 24, 2018 11:17 AM Jodie Echevaria wrote: Reason for CRM:  [place] with [facility] called to request clarification on orders sent over on 09/23/18. She has faxed paperwork over and request a rush on the return please. Ph# [phone] and Fax# [phone]

## 2018-09-25 ENCOUNTER — Encounter: Payer: Self-pay | Admitting: Family Medicine

## 2018-09-26 ENCOUNTER — Telehealth: Payer: Self-pay | Admitting: Family Medicine

## 2018-09-26 NOTE — Telephone Encounter (Signed)
Copied from [place] [id number]. Topic: General - Other >> Sep 26, 2018  8:20 AM Leward Quan A wrote: Reason for CRM: Kathyrn Sheriff nurse with [facility] called to inform PCP that patient blood sugar was 201 this morning. She was given her ordered dose of Lantus. Please advise Ph# [phone] >> Sep 26, 2018  2:30 PM Yvette Rack wrote: Pt daughter [name] States that [place] told her that the order for her insulin says to call [facility] if her reading is over 200 pt daughter states that she would like for someone to call [facility] [phone] with correct orders pt has had 2 meals with no insulin per daughter

## 2018-09-26 NOTE — Telephone Encounter (Signed)
Copied from [place] [phone]. Topic: General - Other >> Sep 26, 2018  8:20 AM Leward Quan A wrote: Reason for CRM: Kathyrn Sheriff nurse with [facility] called to inform PCP that patient blood sugar was 201 this morning. She was given her ordered dose of Lantus. Please advise Ph# [phone] >> Sep 26, 2018  2:30 PM Yvette Rack wrote: Pt daughter [name] States that [facility] told her that the order for her insulin says to call proioder if her reading is over 200 pt daughter states that she would like for someone to call [facility] [phone] with correct orders pt has had 2 meals with no insulin per daughter

## 2018-09-26 NOTE — Telephone Encounter (Signed)
Copied from [place] [phone]. Topic: General - Other >> Sep 26, 2018  8:20 AM Leward Quan A wrote: Reason for CRM: Kathyrn Sheriff nurse with [facility] called to inform PCP that patient blood sugar was 201 this morning. She was given her ordered dose of Lantus. Please advise Ph# [phone] >> Sep 26, 2018  2:30 PM Yvette Rack wrote: Pt daughter [name] States that [facility] told her that the order for her insulin says to call proioder if her reading is over 200 pt daughter states that she would like for someone to call [facility] [phone] with correct orders pt has had 2 meals with no insulin per daughter

## 2018-09-26 NOTE — Telephone Encounter (Signed)
Copied from [place] [phone]. Topic: General - Other >> Sep 26, 2018  8:20 AM Leward Quan A wrote: Reason for CRM: Kathyrn Sheriff nurse with [facility] called to inform PCP that patient blood sugar was 201 this morning. She was given her ordered dose of Lantus. Please advise Ph# [phone]

## 2018-09-26 NOTE — Telephone Encounter (Signed)
Dr Santiago please advise 

## 2018-09-27 NOTE — Telephone Encounter (Signed)
Spoke with [facility], clarified concerns

## 2018-10-06 DIAGNOSIS — K3184 Gastroparesis: Secondary | ICD-10-CM | POA: Diagnosis not present

## 2018-10-06 DIAGNOSIS — E0841 Diabetes mellitus due to underlying condition with diabetic mononeuropathy: Secondary | ICD-10-CM | POA: Diagnosis not present

## 2018-10-06 DIAGNOSIS — E0865 Diabetes mellitus due to underlying condition with hyperglycemia: Secondary | ICD-10-CM | POA: Diagnosis not present

## 2018-10-06 DIAGNOSIS — R748 Abnormal levels of other serum enzymes: Secondary | ICD-10-CM | POA: Diagnosis not present

## 2018-10-08 ENCOUNTER — Telehealth: Payer: Self-pay | Admitting: Family Medicine

## 2018-10-08 ENCOUNTER — Encounter: Payer: Self-pay | Admitting: Family Medicine

## 2018-10-08 NOTE — Telephone Encounter (Unsigned)
Copied from [place] [phone]. Topic: General - Other >> Oct 08, 2018 11:06 AM Leward Quan A wrote: Reason for CRM:  Patient daughter called to address concerns about the arm that was treated at last visit for Cellulitis of right upper extremity. Patient has finished using medication ordered but states that arm feel funny. She also has concerns about rash on her back. Johnnette Litter daughter request a call back from a nurse.  Ph# [phone]

## 2018-10-09 ENCOUNTER — Ambulatory Visit (INDEPENDENT_AMBULATORY_CARE_PROVIDER_SITE_OTHER): Payer: Medicare Other | Admitting: Physician Assistant

## 2018-10-09 ENCOUNTER — Encounter: Payer: Self-pay | Admitting: Physician Assistant

## 2018-10-09 ENCOUNTER — Other Ambulatory Visit: Payer: Self-pay

## 2018-10-09 VITALS — BP 148/76 | HR 66 | Temp 98.5°F | Resp 17 | Ht 63.19 in | Wt 122.4 lb

## 2018-10-09 DIAGNOSIS — L249 Irritant contact dermatitis, unspecified cause: Secondary | ICD-10-CM

## 2018-10-09 DIAGNOSIS — L03114 Cellulitis of left upper limb: Secondary | ICD-10-CM

## 2018-10-09 MED ORDER — CEPHALEXIN 500 MG PO CAPS: 500.0000 mg | ORAL_CAPSULE | Freq: Four times a day (QID) | ORAL | 0 refills | Status: AC

## 2018-10-09 NOTE — Patient Instructions (Addendum)
Continue antibiotic for an additional 4 days. Elevate arm at home.  If no improvement, please return.   For rash on side, use a daily moisturizer and continue steroid cream.    If you have lab work done today you will be contacted with your lab results within the next 2 weeks.  If you have not heard from [place] then please contact us. The fastest way to get your results is to register for My Chart.  Cellulitis, Adult Cellulitis is a skin infection. The infected area is usually red and sore. This condition occurs most often in the arms and lower legs. It is very important to get treated for this condition. Follow these instructions at home:  Take over-the-counter and prescription medicines only as told by your doctor.  If you were prescribed an antibiotic medicine, take it as told by your doctor. Do not stop taking the antibiotic even if you start to feel better.  Drink enough fluid to keep your pee (urine) clear or pale yellow.  Do not touch or rub the infected area.  Raise (elevate) the infected area above the level of your heart while you are sitting or lying down.  Place warm or cold wet cloths (warm or cold compresses) on the infected area. Do this as told by your doctor.  Keep all follow-up visits as told by your doctor. This is important. These visits let your doctor make sure your infection is not getting worse. Contact a doctor if:  You have a fever.  Your symptoms do not get better after 1-2 days of treatment.  Your bone or joint under the infected area starts to hurt after the skin has healed.  Your infection comes back. This can happen in the same area or another area.  You have a swollen bump in the infected area.  You have new symptoms.  You feel ill and also have muscle aches and pains. Get help right away if:  Your symptoms get worse.  You feel very sleepy.  You throw up (vomit) or have watery poop (diarrhea) for a long time.  There are red streaks coming  from the infected area.  Your red area gets larger.  Your red area turns darker. This information is not intended to replace advice given to you by your health care provider. Make sure you discuss any questions you have with your health care provider. Document Released: 05/28/2008 Document Revised: 05/17/2016 Document Reviewed: 10/19/2015 Elsevier Interactive Patient Education  2018 [employer].   IF you received an x-ray today, you will receive an invoice from [facility]. Please contact [facility] at [phone] with questions or concerns regarding your invoice.   IF you received labwork today, you will receive an invoice from [facility]. Please contact [facility] at [phone] with questions or concerns regarding your invoice.   Our billing staff will not be able to assist you with questions regarding bills from these companies.  You will be contacted with the lab results as soon as they are available. The fastest way to get your results is to activate your My Chart account. Instructions are located on the last page of this paperwork. If you have not heard from [place] regarding the results in 2 weeks, please contact this office.

## 2018-10-09 NOTE — Telephone Encounter (Signed)
Pt has appt today at 11:40 a.m with Timmothy Euler and will address cellulitis of right upper extremity and arm feeling funny. Dgaddy, CMA

## 2018-10-09 NOTE — Progress Notes (Signed)
Judith Kitchen   Judith [name]  MRN: [id number] DOB: 05-24-1941  Subjective:  Judith [name] is a 77 y.o. female seen in office today for a chief complaint of recheck on cellulitis of left arm.  Last seen by Dr. Pamella Pert on 09/22/2018.  Treated with 10 days worth of Keflex.  Today, reports she completed keflex. Sx improved but have not fully gone away. Still mildly warm and a little red. Swelling has improved.  Has not been elevating arm.  Denies fever, chills, pain, numbness, tingling.  Would also like her rash on the waist to be checked. It has improved with triamcinolone but would still like it checked. It itches. No pain.  Has not been applying daily moisturizer as advised.  No new lotions, laundry detergents, soaps, plants, animals, or other skin products that she can remember.  Review of Systems Per HPI Patient Active Problem List   Diagnosis Date Noted  . Multilevel degenerative disc disease 04/03/2018  . Acute blood loss anemia 01/17/2018  . Gallstones 01/17/2018  . Weight loss, unintentional 01/17/2018  . Dilated bile duct   . Elevated liver function tests   . Gastritis and gastroduodenitis   . Complete heart block ([place]) 11/09/2017  . S/P partial gastrectomy   . Peripheral neuropathy   . Hypertension   . Gastroparesis   . Duodenal ulcer disease   . Diabetes mellitus type 2 with complications ([place])   . Depression   . Biliary gastritis   . Seasonal allergies   . Presence of cardiac pacemaker   . Gout   . Cervical spine arthritis 04/05/2016  . Long-term insulin use in type 2 diabetes ([place]) 04/05/2016    Current Outpatient Medications on File Prior to Visit  Medication Sig Dispense Refill  . Biotin 1000 MCG tablet Take 1,000 mcg by mouth daily.    . blood glucose meter kit and supplies KIT Dispense based on patient and insurance preference. Check capillary glucose three times a day, before meals.  Dx E11.65, Z79.4 1 each 0  . carvedilol (COREG) 12.5 MG tablet TAKE 1 TABLET (12.5 MG  TOTAL) 2 (TWO) TIMES DAILY WITH A MEAL BY MOUTH. 180 tablet 0  . colchicine 0.6 MG tablet Take 0.6 mg by mouth daily.    . diphenhydrAMINE (BENADRYL) 25 mg capsule Take 1 capsule (25 mg total) by mouth daily as needed for itching. 30 capsule 0  . DULoxetine (CYMBALTA) 60 MG capsule TAKE 1 CAPSULE (60 MG TOTAL) DAILY BY MOUTH. 90 capsule 0  . ferrous sulfate 325 (65 FE) MG tablet TAKE 1 TABLET BY MOUTH EVERY DAY WITH BREAKFAST 30 tablet 1  . gabapentin (NEURONTIN) 100 MG capsule Take 1 capsule (100 mg) every morning and afternoon, and take 4 capsules (450m) at bedtime 180 capsule 3  . glucose blood (ONE TOUCH ULTRA TEST) test strip USE TO CHECK GLUCOSE LEVELS 3 TIMES A DAY, DX E11.9, Z79.4 100 each 11  . hydrochlorothiazide (HYDRODIURIL) 25 MG tablet Take 12.5 mg by mouth daily.    . Insulin Glargine (LANTUS SOLOSTAR) 100 UNIT/ML Solostar Pen Inject 26 Units into the skin daily at 10 pm. (Patient taking differently: Inject 35 Units into the skin daily. ) 5 pen 2  . insulin lispro (HUMALOG) 100 UNIT/ML injection Inject 1-10 Units into the skin 3 (three) times daily before meals. Sliding scale    . Insulin Pen Needle (B-D ULTRAFINE III SHORT PEN) 31G X 8 MM MISC To use as directed with insulin, four times a day. Dx E11.9, Z79.4  200 each 5  . losartan (COZAAR) 50 MG tablet Take 1 tablet (50 mg total) by mouth daily. 90 tablet 1  . Multiple Vitamin (MULTIVITAMIN) tablet Take 1 tablet daily by mouth.    . pantoprazole (PROTONIX) 40 MG tablet TAKE 1 TABLET (40 MG TOTAL) DAILY BY MOUTH. 90 tablet 0  . ranitidine (ZANTAC) 150 MG capsule Take 1 capsule (150 mg total) at bedtime by mouth. 90 capsule 0  . triamcinolone cream (KENALOG) 0.5 % Apply 1 application topically 2 (two) times daily. 30 g 1  . diclofenac sodium (VOLTAREN) 1 % GEL Apply 2 g topically 4 (four) times daily. (Patient not taking: Reported on 09/22/2018) 100 g 2   No current facility-administered medications on file prior to visit.      Allergies  Allergen Reactions  . Asa [Aspirin] Other (See Comments)    Causes ulcers   . Tylenol [Acetaminophen]     Intolerance due to liver damage  . Sulfa Antibiotics Rash     Objective:  BP (!) 148/76   Pulse 66   Temp 98.5 F (36.9 C) (Oral)   Resp 17   Ht 5' 3.19" (1.605 m)   Wt 122 lb 6.4 oz (55.5 kg)   SpO2 96%   BMI 21.55 kg/m   Physical Exam  Constitutional: She is oriented to person, place, and time. She appears well-developed and well-nourished.  HENT:  Head: Normocephalic and atraumatic.  Eyes: Conjunctivae are normal.  Neck: Normal range of motion.  Pulmonary/Chest: Effort normal.  Neurological: She is alert and oriented to person, place, and time.  Skin: Skin is warm and dry.     Psychiatric: She has a normal mood and affect.  Vitals reviewed.    Assessment and Plan :  1. Cellulitis of arm, left Symptoms have almost completely resolved but still with mild swelling, warmth, and redness.  She is neurovascularly intact. Suggest extending antibiotic course for the next 4 days.  Strongly advised elevation.  Given strict return precautions.   - cephALEXin (KEFLEX) 500 MG capsule; Take 1 capsule (500 mg total) by mouth 4 (four) times daily for 4 days.  Dispense: 16 capsule; Refill: 0  2. Irritant contact dermatitis, unspecified trigger Suggest topical moisturizer daily.  May continue using triamcinolone.  No physical exam findings concerning for underlying infection.  Tenna Delaine PA-C  [facility] 10/10/2018 10:47 AM

## 2018-10-10 ENCOUNTER — Encounter: Payer: Self-pay | Admitting: Physician Assistant

## 2018-10-24 DIAGNOSIS — M79673 Pain in unspecified foot: Secondary | ICD-10-CM | POA: Diagnosis not present

## 2018-10-24 DIAGNOSIS — E1121 Type 2 diabetes mellitus with diabetic nephropathy: Secondary | ICD-10-CM | POA: Diagnosis not present

## 2018-10-24 DIAGNOSIS — L603 Nail dystrophy: Secondary | ICD-10-CM | POA: Diagnosis not present

## 2018-10-24 DIAGNOSIS — R2681 Unsteadiness on feet: Secondary | ICD-10-CM | POA: Diagnosis not present

## 2018-10-30 ENCOUNTER — Encounter (INDEPENDENT_AMBULATORY_CARE_PROVIDER_SITE_OTHER): Payer: Medicare Other | Admitting: Ophthalmology

## 2018-10-30 DIAGNOSIS — E113313 Type 2 diabetes mellitus with moderate nonproliferative diabetic retinopathy with macular edema, bilateral: Secondary | ICD-10-CM

## 2018-10-30 DIAGNOSIS — H35033 Hypertensive retinopathy, bilateral: Secondary | ICD-10-CM | POA: Diagnosis not present

## 2018-10-30 DIAGNOSIS — I10 Essential (primary) hypertension: Secondary | ICD-10-CM

## 2018-10-30 DIAGNOSIS — E11311 Type 2 diabetes mellitus with unspecified diabetic retinopathy with macular edema: Secondary | ICD-10-CM | POA: Diagnosis not present

## 2018-10-30 DIAGNOSIS — H43813 Vitreous degeneration, bilateral: Secondary | ICD-10-CM

## 2018-11-19 ENCOUNTER — Ambulatory Visit (INDEPENDENT_AMBULATORY_CARE_PROVIDER_SITE_OTHER): Payer: Medicare Other | Admitting: Cardiovascular Disease

## 2018-11-19 ENCOUNTER — Encounter: Payer: Self-pay | Admitting: Cardiovascular Disease

## 2018-11-19 VITALS — BP 175/77 | HR 73 | Ht 63.0 in | Wt 120.8 lb

## 2018-11-19 DIAGNOSIS — H25011 Cortical age-related cataract, right eye: Secondary | ICD-10-CM | POA: Diagnosis not present

## 2018-11-19 DIAGNOSIS — H2511 Age-related nuclear cataract, right eye: Secondary | ICD-10-CM | POA: Diagnosis not present

## 2018-11-19 DIAGNOSIS — H40013 Open angle with borderline findings, low risk, bilateral: Secondary | ICD-10-CM | POA: Diagnosis not present

## 2018-11-19 DIAGNOSIS — Z950 Presence of cardiac pacemaker: Secondary | ICD-10-CM | POA: Diagnosis not present

## 2018-11-19 DIAGNOSIS — I10 Essential (primary) hypertension: Secondary | ICD-10-CM | POA: Diagnosis not present

## 2018-11-19 DIAGNOSIS — Z794 Long term (current) use of insulin: Secondary | ICD-10-CM | POA: Diagnosis not present

## 2018-11-19 DIAGNOSIS — E1140 Type 2 diabetes mellitus with diabetic neuropathy, unspecified: Secondary | ICD-10-CM | POA: Diagnosis not present

## 2018-11-19 DIAGNOSIS — H35033 Hypertensive retinopathy, bilateral: Secondary | ICD-10-CM | POA: Diagnosis not present

## 2018-11-19 DIAGNOSIS — H25041 Posterior subcapsular polar age-related cataract, right eye: Secondary | ICD-10-CM | POA: Diagnosis not present

## 2018-11-19 DIAGNOSIS — I442 Atrioventricular block, complete: Secondary | ICD-10-CM

## 2018-11-19 DIAGNOSIS — E113313 Type 2 diabetes mellitus with moderate nonproliferative diabetic retinopathy with macular edema, bilateral: Secondary | ICD-10-CM | POA: Diagnosis not present

## 2018-11-19 MED ORDER — LOSARTAN POTASSIUM 100 MG PO TABS: 100.0000 mg | ORAL_TABLET | Freq: Every day | ORAL | 3 refills | Status: DC

## 2018-11-19 NOTE — Patient Instructions (Signed)
Medication Instructions:  Dr Sallyanne Kuster has recommended making the following medication changes: 1. INCREASE Losartan to 100 mg daily  If you need a refill on your cardiac medications before your next appointment, please call your pharmacy.   Follow-Up: At [facility], you and your health needs are our priority.  As part of our continuing mission to provide you with exceptional heart care, we have created designated Provider Care Teams.  These Care Teams include your primary Cardiologist (physician) and Advanced Practice Providers (APPs -  Physician Assistants and Nurse Practitioners) who all work together to provide you with the care you need, when you need it. You will need a follow up appointment in 6 months.  Please call our office 2 months in advance to schedule this appointment.  You may see Sanda Klein, MD or one of the following Advanced Practice Providers on your designated Care Team: [place], [place] . Fabian Sharp, PA-C

## 2018-11-19 NOTE — Progress Notes (Signed)
Cardiology office note:    Date:  11/19/2018   ID:  Judith [name], DOB 1941/09/22, MRN [id number]  PCP:  Judith Guys, MD  Cardiologist:  Judith Klein, MD    Referring MD: Judith Guys, MD   Chief complaint: pacemaker check  History of Present Illness:    Judith [name] is a 77 y.o. female with a hx of pacemaker implantation in [place].  She moved to [place] while recovering from a fall but now is a resident at [facility] with no plans of returning to [place].  She is accompanied by her daughter.  She has not had any new health problems since her last appointment and has made good recovery.  She no longer requires mobility aids around the house, but uses a walker when she leaves the home.  She denies cardiovascular complaints.The patient specifically denies any chest pain at rest or with exertion, dyspnea at rest or with exertion, orthopnea, paroxysmal nocturnal dyspnea, syncope, palpitations, focal neurological deficits, intermittent claudication, lower extremity edema, unexplained weight gain, cough, hemoptysis or wheezing.  Her blood pressure has been persistently elevated for a few months.  Her dose of losartan was increased to 50 mg daily a couple of months ago, nevertheless she continues to have systolic blood pressure in the 160s-170s.  She denies headaches, blurry vision, focal neurological complaints.  Her initial pacemaker was implanted in 2007.  It appears that her ventricular lead is still the original one (Guidant [id number] implanted July 05, 2006).  June 09, 2010 she underwent a pacemaker generator change out and placement of a new atrial lead (Guidant [id number]).  Her current generator is a Control and instrumentation engineer [id number].  She has complete heart block and is pacemaker dependent.  There is intermittently a idioventricular escape rhythm at about 30-35 bpm.  It is not always present.  She has 3% atrial pacing with good heart rate histogram distribution.  There is 100%  ventricular pacing.  Estimated generator longevity is roughly 3 years.  All lead parameters are excellent.  She has not had atrial fibrillation or ventricular tachycardia.  At her previous device check we had detected a 6-second episode of possible atrial fibrillation.  She does not have a history of coronary disease.  She reports undergoing stress test in the past, last one as recently as 3 years ago.  Normal results.  She had an echocardiogram just a few months before coming to [place] and was told that her heart pumping strength is normal.  She was told that she has a murmur which is due to a mildly leaky valve.  There is no reported history of heart failure that I am aware of.  Her presenting rhythm today was atrial sensed, ventricular paced.  She does not have intermittent claudication and reports that lower extremity ABIs have been performed in the past with normal results.  She has never had a stroke or TIA.  She is to take lovastatin for lipid lowering but has been off this medication for a while.  She has lost about 30 pounds of weight.  Her lipid profile performed on November 6 shows an LDL cholesterol of 88, HDL 26, triglycerides 138, total cholesterol 142. Most recent hemoglobin A1c was 6.1%.  Past Medical History:  Diagnosis Date  . Anemia   . Arthritis   . Biliary gastritis   . Blood transfusion without reported diagnosis    2007  . Depression   . Diabetes mellitus type 2 with complications ([place])   .  Duodenal ulcer disease   . Encounter for long-term (current) use of insulin ([facility] [facility])   . Gastric ulcer   . Gastroparesis    severe  . GERD (gastroesophageal reflux disease)   . Gout    tested for gout but was not  . Heart murmur   . Hyperlipidemia    in past not now  . Hypertension   . Hypokalemia, gastrointestinal losses    secondary to severe gastroparesis  . Osteoporosis    just had bone density test possible   . Peripheral neuropathy   . Presence of cardiac pacemaker     . S/P partial gastrectomy    due to severe gastric and gudoenal ulcers  . Seasonal allergies   . Sleep apnea    could not use CPAP  . UTI (urinary tract infection)     Past Surgical History:  Procedure Laterality Date  . COLONOSCOPY  02/11/2018   no polyps, + small internal hemorrhoids  . EUS N/A 12/12/2017   Procedure: UPPER ENDOSCOPIC ULTRASOUND (EUS) RADIAL;  Surgeon: [name], MD;  Location: [facility];  Service: Endoscopy;  Laterality: N/A;  . GASTRECTOMY    . JOINT REPLACEMENT    . pace maker    . PARTIAL GASTRECTOMY    . TONSILLECTOMY     45 years ago  . UPPER GASTROINTESTINAL ENDOSCOPY      Current Medications: Current Meds  Medication Sig  . Biotin 1000 MCG tablet Take 1,000 mcg by mouth daily.  . blood glucose meter kit and supplies KIT Dispense based on patient and insurance preference. Check capillary glucose three times a day, before meals.  Dx E11.65, Z79.4  . carvedilol (COREG) 12.5 MG tablet TAKE 1 TABLET (12.5 MG TOTAL) 2 (TWO) TIMES DAILY WITH A MEAL BY MOUTH.  . colchicine 0.6 MG tablet Take 0.6 mg by mouth daily.  . diclofenac sodium (VOLTAREN) 1 % GEL Apply 2 g topically 4 (four) times daily.  . diphenhydrAMINE (BENADRYL) 25 mg capsule Take 1 capsule (25 mg total) by mouth daily as needed for itching.  . DULoxetine (CYMBALTA) 60 MG capsule TAKE 1 CAPSULE (60 MG TOTAL) DAILY BY MOUTH.  . ferrous sulfate 325 (65 FE) MG tablet TAKE 1 TABLET BY MOUTH EVERY DAY WITH BREAKFAST  . gabapentin (NEURONTIN) 100 MG capsule Take 1 capsule (100 mg) every morning and afternoon, and take 4 capsules (464m) at bedtime  . glucose blood (ONE TOUCH ULTRA TEST) test strip USE TO CHECK GLUCOSE LEVELS 3 TIMES A DAY, DX E11.9, Z79.4  . hydrochlorothiazide (HYDRODIURIL) 25 MG tablet Take 12.5 mg by mouth daily.  . Insulin Glargine (LANTUS SOLOSTAR) 100 UNIT/ML Solostar Pen Inject 26 Units into the skin daily at 10 pm. (Patient taking differently: Inject 35 Units into the  skin daily. )  . insulin lispro (HUMALOG) 100 UNIT/ML injection Inject 1-10 Units into the skin 3 (three) times daily before meals. Sliding scale  . Insulin Pen Needle (B-D ULTRAFINE III SHORT PEN) 31G X 8 MM MISC To use as directed with insulin, four times a day. Dx E11.9, Z79.4  . losartan (COZAAR) 100 MG tablet Take 1 tablet (100 mg total) by mouth daily.  . Multiple Vitamin (MULTIVITAMIN) tablet Take 1 tablet daily by mouth.  . pantoprazole (PROTONIX) 40 MG tablet TAKE 1 TABLET (40 MG TOTAL) DAILY BY MOUTH.  . ranitidine (ZANTAC) 150 MG capsule Take 1 capsule (150 mg total) at bedtime by mouth.  . triamcinolone cream (KENALOG) 0.5 % Apply 1 application  topically 2 (two) times daily.  . [DISCONTINUED] losartan (COZAAR) 50 MG tablet Take 1 tablet (50 mg total) by mouth daily.     Allergies:   Asa [aspirin]; Tylenol [acetaminophen]; and Sulfa antibiotics   Social History   Socioeconomic History  . Marital status: Widowed    Spouse name: Not on file  . Number of children: 3  . Years of education: Not on file  . Highest education level: High school graduate  Occupational History  . Not on file  Social Needs  . Financial resource strain: Not hard at all  . Food insecurity:    Worry: Never true    Inability: Never true  . Transportation needs:    Medical: No    Non-medical: No  Tobacco Use  . Smoking status: Never Smoker  . Smokeless tobacco: Never Used  Substance and Sexual Activity  . Alcohol use: No    Alcohol/week: 0.0 standard drinks  . Drug use: No  . Sexual activity: Not Currently  Lifestyle  . Physical activity:    Days per week: 0 days    Minutes per session: 0 min  . Stress: Not at all  Relationships  . Social connections:    Talks on phone: Once a week    Gets together: Once a week    Attends religious service: More than 4 times per year    Active member of club or organization: No    Attends meetings of clubs or organizations: Never    Relationship status:  Widowed  Other Topics Concern  . Not on file  Social History Narrative   Used to live independently in [place]   However had significant issues with severe gastroparesis resulting in severe hypokalemia and general weakness and fall resulting in hospitalization and SNF rehab   Now (10/2017) living with her daughter in [place] until able to stabilize and regain independence.      May 2019   Patient living in assisted living, [place]     Family History: The patient's family history significant for the absence of premature cardiac illness  ROS:   Please see the history of present illness.     All other systems reviewed and are negative.  EKGs/Labs/Other Studies Reviewed:    The following studies were reviewed today: Comprehensive pacemaker interrogation  EKG:  EKG is ordered today.  Atrial sensed (sinus mechanism), ventricular paced rhythm.  Very broad QRS 170 ms with a positive R waves in lead V1, QTC 495 ms Recent Labs: 04/22/2018: Hemoglobin 11.8; Platelets 302 09/22/2018: ALT 9; BUN 15; Creatinine, Ser 0.78; Potassium 4.2; Sodium 141  Recent Lipid Panel    Component Value Date/Time   CHOL 142 10/29/2017 1515   TRIG 138 10/29/2017 1515   HDL 26 (L) 10/29/2017 1515   CHOLHDL 5.5 (H) 10/29/2017 1515   LDLCALC 88 10/29/2017 1515    Physical Exam:    VS:  BP (!) 175/77   Pulse 73   Ht '5\' 3"'$  (1.6 m)   Wt 120 lb 12.8 oz (54.8 kg)   BMI 21.40 kg/m      Wt Readings from Last 3 Encounters:  11/19/18 120 lb 12.8 oz (54.8 kg)  10/09/18 122 lb 6.4 oz (55.5 kg)  09/22/18 125 lb 3.2 oz (56.8 kg)     General: Alert, oriented x3, no distress, appears very slender and frail, but much healthier than at her previous appointment. Head: no evidence of trauma, PERRL, EOMI, no exophtalmos or lid lag, no myxedema, no xanthelasma;  normal ears, nose and oropharynx Neck: normal jugular venous pulsations and no hepatojugular reflux; brisk carotid pulses without delay and no carotid  bruits Chest: clear to auscultation, no signs of consolidation by percussion or palpation, normal fremitus, symmetrical and full respiratory excursions Cardiovascular: normal position and quality of the apical impulse, regular rhythm, normal first and second heart sounds, no murmurs, rubs or gallops Abdomen: no tenderness or distention, no masses by palpation, no abnormal pulsatility or arterial bruits, normal bowel sounds, no hepatosplenomegaly Extremities: no clubbing, cyanosis or edema; 2+ radial, ulnar and brachial pulses bilaterally; 2+ right femoral, posterior tibial and dorsalis pedis pulses; 2+ left femoral, posterior tibial and dorsalis pedis pulses; no subclavian or femoral bruits Neurological: grossly nonfocal Psych: Normal mood and affect   ASSESSMENT:    1. Complete heart block ([facility])   2. Presence of cardiac pacemaker   3. Essential hypertension   4. Controlled type 2 diabetes mellitus with diabetic neuropathy, with long-term current use of insulin ([facility])    PLAN:    In order of problems listed above:  1. CHB: Should be considered device dependent although there is often idioventricular escape rhythm at 30 bpm.   2. PPM: Normal device function, no reprogramming was necessary. Continue remote downloads every 3 months and yearly office visits. 3. DM: With multiple complications including gastroparesis and neuropathy.  Good glycemic control with hemoglobin A1c 6.1% earlier this year. 4. HTN: Increase losartan to 100 mg daily.  She has a follow-up appointment in a month with Dr. Pamella Pert and further adjustment of medications can be made.  For example, we could increase her carvedilol to 25 mg twice daily.  I would avoid increasing her diuretics since she has a tendency to develop pseudogout as well as dehydration related to gastroparesis.   Medication Adjustments/Labs and Tests Ordered: Current medicines are reviewed at length with the patient today.  Concerns regarding medicines  are outlined above.  Orders Placed This Encounter  Procedures  . EKG 12-Lead   Meds ordered this encounter  Medications  . losartan (COZAAR) 100 MG tablet    Sig: Take 1 tablet (100 mg total) by mouth daily.    Dispense:  90 tablet    Refill:  3    Signed, Judith Klein, MD  11/19/2018 5:42 PM     [facility]

## 2018-12-04 ENCOUNTER — Encounter (INDEPENDENT_AMBULATORY_CARE_PROVIDER_SITE_OTHER): Payer: Medicare Other | Admitting: Ophthalmology

## 2018-12-04 DIAGNOSIS — E113211 Type 2 diabetes mellitus with mild nonproliferative diabetic retinopathy with macular edema, right eye: Secondary | ICD-10-CM | POA: Diagnosis not present

## 2018-12-04 DIAGNOSIS — H43813 Vitreous degeneration, bilateral: Secondary | ICD-10-CM

## 2018-12-04 DIAGNOSIS — I10 Essential (primary) hypertension: Secondary | ICD-10-CM | POA: Diagnosis not present

## 2018-12-04 DIAGNOSIS — H2511 Age-related nuclear cataract, right eye: Secondary | ICD-10-CM

## 2018-12-04 DIAGNOSIS — E11311 Type 2 diabetes mellitus with unspecified diabetic retinopathy with macular edema: Secondary | ICD-10-CM

## 2018-12-04 DIAGNOSIS — H35033 Hypertensive retinopathy, bilateral: Secondary | ICD-10-CM

## 2018-12-04 DIAGNOSIS — E113312 Type 2 diabetes mellitus with moderate nonproliferative diabetic retinopathy with macular edema, left eye: Secondary | ICD-10-CM

## 2018-12-08 NOTE — Progress Notes (Signed)
10% weight gain, no edema , no complaints per [facility]

## 2018-12-23 ENCOUNTER — Ambulatory Visit: Payer: Medicare Other | Admitting: Family Medicine

## 2018-12-23 DIAGNOSIS — H2511 Age-related nuclear cataract, right eye: Secondary | ICD-10-CM | POA: Diagnosis not present

## 2018-12-23 DIAGNOSIS — H25811 Combined forms of age-related cataract, right eye: Secondary | ICD-10-CM | POA: Diagnosis not present

## 2018-12-25 DIAGNOSIS — E1121 Type 2 diabetes mellitus with diabetic nephropathy: Secondary | ICD-10-CM | POA: Diagnosis not present

## 2018-12-25 DIAGNOSIS — M79673 Pain in unspecified foot: Secondary | ICD-10-CM | POA: Diagnosis not present

## 2018-12-25 DIAGNOSIS — L608 Other nail disorders: Secondary | ICD-10-CM | POA: Diagnosis not present

## 2018-12-25 DIAGNOSIS — R269 Unspecified abnormalities of gait and mobility: Secondary | ICD-10-CM | POA: Diagnosis not present

## 2019-01-04 ENCOUNTER — Other Ambulatory Visit: Payer: Self-pay

## 2019-01-04 ENCOUNTER — Emergency Department (HOSPITAL_COMMUNITY)
Admission: EM | Admit: 2019-01-04 | Discharge: 2019-01-04 | Disposition: A | Discharge status: Home / Self Care | Payer: Medicare Other | Attending: Emergency Medicine | Admitting: Emergency Medicine

## 2019-01-04 ENCOUNTER — Encounter (HOSPITAL_COMMUNITY): Payer: Self-pay | Admitting: Emergency Medicine

## 2019-01-04 DIAGNOSIS — Z794 Long term (current) use of insulin: Secondary | ICD-10-CM | POA: Insufficient documentation

## 2019-01-04 DIAGNOSIS — R1084 Generalized abdominal pain: Secondary | ICD-10-CM | POA: Diagnosis not present

## 2019-01-04 DIAGNOSIS — I10 Essential (primary) hypertension: Secondary | ICD-10-CM | POA: Insufficient documentation

## 2019-01-04 DIAGNOSIS — Z79899 Other long term (current) drug therapy: Secondary | ICD-10-CM | POA: Insufficient documentation

## 2019-01-04 DIAGNOSIS — Z743 Need for continuous supervision: Secondary | ICD-10-CM | POA: Diagnosis not present

## 2019-01-04 DIAGNOSIS — R279 Unspecified lack of coordination: Secondary | ICD-10-CM | POA: Diagnosis not present

## 2019-01-04 DIAGNOSIS — R197 Diarrhea, unspecified: Secondary | ICD-10-CM | POA: Diagnosis not present

## 2019-01-04 DIAGNOSIS — E1140 Type 2 diabetes mellitus with diabetic neuropathy, unspecified: Secondary | ICD-10-CM | POA: Diagnosis not present

## 2019-01-04 DIAGNOSIS — R52 Pain, unspecified: Secondary | ICD-10-CM | POA: Diagnosis not present

## 2019-01-04 DIAGNOSIS — E785 Hyperlipidemia, unspecified: Secondary | ICD-10-CM | POA: Diagnosis not present

## 2019-01-04 DIAGNOSIS — R1111 Vomiting without nausea: Secondary | ICD-10-CM | POA: Diagnosis not present

## 2019-01-04 DIAGNOSIS — R5381 Other malaise: Secondary | ICD-10-CM | POA: Diagnosis not present

## 2019-01-04 DIAGNOSIS — R531 Weakness: Secondary | ICD-10-CM | POA: Diagnosis present

## 2019-01-04 DIAGNOSIS — R110 Nausea: Secondary | ICD-10-CM | POA: Diagnosis not present

## 2019-01-04 LAB — CBC WITH DIFFERENTIAL/PLATELET
Abs Immature Granulocytes: 0.05 10*3/uL (ref 0.00–0.07)
Basophils Absolute: 0.1 10*3/uL (ref 0.0–0.1)
Basophils Relative: 1 %
EOS PCT: 2 %
Eosinophils Absolute: 0.3 10*3/uL (ref 0.0–0.5)
HCT: 48.6 % — ABNORMAL HIGH (ref 36.0–46.0)
Hemoglobin: 15.4 g/dL — ABNORMAL HIGH (ref 12.0–15.0)
Immature Granulocytes: 0 %
Lymphocytes Relative: 14 %
Lymphs Abs: 1.7 10*3/uL (ref 0.7–4.0)
MCH: 30.2 pg (ref 26.0–34.0)
MCHC: 31.7 g/dL (ref 30.0–36.0)
MCV: 95.3 fL (ref 80.0–100.0)
Monocytes Absolute: 0.9 10*3/uL (ref 0.1–1.0)
Monocytes Relative: 7 %
NRBC: 0 % (ref 0.0–0.2)
Neutro Abs: 9.3 10*3/uL — ABNORMAL HIGH (ref 1.7–7.7)
Neutrophils Relative %: 76 %
Platelets: 301 10*3/uL (ref 150–400)
RBC: 5.1 MIL/uL (ref 3.87–5.11)
RDW: 13.8 % (ref 11.5–15.5)
WBC: 12.3 10*3/uL — ABNORMAL HIGH (ref 4.0–10.5)

## 2019-01-04 LAB — COMPREHENSIVE METABOLIC PANEL
ALBUMIN: 3.7 g/dL (ref 3.5–5.0)
ALT: 13 U/L (ref 0–44)
AST: 34 U/L (ref 15–41)
Alkaline Phosphatase: 75 U/L (ref 38–126)
Anion gap: 16 — ABNORMAL HIGH (ref 5–15)
BUN: 19 mg/dL (ref 8–23)
CALCIUM: 9.5 mg/dL (ref 8.9–10.3)
CO2: 23 mmol/L (ref 22–32)
Chloride: 99 mmol/L (ref 98–111)
Creatinine, Ser: 0.97 mg/dL (ref 0.44–1.00)
GFR calc Af Amer: >60 mL/min (ref >60)
GFR calc non Af Amer: 56 mL/min — ABNORMAL LOW (ref >60)
Glucose, Bld: 130 mg/dL — ABNORMAL HIGH (ref 70–99)
Potassium: 3.7 mmol/L (ref 3.5–5.1)
Sodium: 138 mmol/L (ref 135–145)
Total Bilirubin: 1.2 mg/dL (ref 0.3–1.2)
Total Protein: 6.7 g/dL (ref 6.5–8.1)

## 2019-01-04 LAB — URINALYSIS, ROUTINE W REFLEX MICROSCOPIC
Bilirubin Urine: NEGATIVE
Glucose, UA: NEGATIVE mg/dL
Hgb urine dipstick: NEGATIVE
Ketones, ur: 20 mg/dL — AB
LEUKOCYTES UA: NEGATIVE
NITRITE: NEGATIVE
Protein, ur: NEGATIVE mg/dL
Specific Gravity, Urine: 1.015 (ref 1.005–1.030)
pH: 5 (ref 5.0–8.0)

## 2019-01-04 LAB — I-STAT VENOUS BLOOD GAS, ED
Acid-Base Excess: 4 mmol/L — ABNORMAL HIGH (ref 0.0–2.0)
Bicarbonate: 27 mmol/L (ref 20.0–28.0)
O2 Saturation: 88 %
PO2 VEN: 51 mmHg — AB (ref 32.0–45.0)
TCO2: 28 mmol/L (ref 22–32)
pCO2, Ven: 36.5 mmHg — ABNORMAL LOW (ref 44.0–60.0)
pH, Ven: 7.477 — ABNORMAL HIGH (ref 7.250–7.430)

## 2019-01-04 LAB — LIPASE, BLOOD: Lipase: 17 U/L (ref 11–51)

## 2019-01-04 LAB — CBG MONITORING, ED: GLUCOSE-CAPILLARY: 128 mg/dL — AB (ref 70–99)

## 2019-01-04 MED ORDER — SODIUM CHLORIDE 0.9 % IV BOLUS
1000.0000 mL | Freq: Once | INTRAVENOUS | Status: AC
  Administered 2019-01-04: 1000 mL via INTRAVENOUS

## 2019-01-04 NOTE — Discharge Instructions (Addendum)
Testing did not show any serious problems, today.  Make sure you that you are trying to drink 1 to 2 L of water every day and eat 3 meals, to improve your strength.

## 2019-01-04 NOTE — ED Triage Notes (Signed)
Per [facility] pt coming from [facility] c/o generalized weakness and not feeling well. Staff reports patient had nausea/vomiting and diarrhea on Thursday and Friday. Symptoms resolved Friday night. Denies any fever.

## 2019-01-04 NOTE — ED Provider Notes (Signed)
[facility] EMERGENCY DEPARTMENT Provider Note   CSN: [id number] Arrival date & time: 01/04/19  1011     History   Chief Complaint Chief Complaint  Patient presents with  . Weakness    HPI Judith [name] is a 78 y.o. female.  HPI   Patient presents for evaluation of weakness following nausea, vomiting and diarrhea, several days ago.  She continues to not be hungry or eat much.  She lives in an assisted living facility where she is usually able to attend to her activities of daily living however is having trouble doing that now.  He denies fever, chest pain, shortness of breath, abdominal pain, back pain, focal weakness or paresthesia.  There are no other known modifying factors.  Past Medical History:  Diagnosis Date  . Anemia   . Arthritis   . Biliary gastritis   . Blood transfusion without reported diagnosis    2007  . Depression   . Diabetes mellitus type 2 with complications ([place])   . Duodenal ulcer disease   . Encounter for long-term (current) use of insulin ([place])   . Gastric ulcer   . Gastroparesis    severe  . GERD (gastroesophageal reflux disease)   . Gout    tested for gout but was not  . Heart murmur   . Hyperlipidemia    in past not now  . Hypertension   . Hypokalemia, gastrointestinal losses    secondary to severe gastroparesis  . Osteoporosis    just had bone density test possible   . Peripheral neuropathy   . Presence of cardiac pacemaker   . S/P partial gastrectomy    due to severe gastric and gudoenal ulcers  . Seasonal allergies   . Sleep apnea    could not use CPAP  . UTI (urinary tract infection)     Patient Active Problem List   Diagnosis Date Noted  . Multilevel degenerative disc disease 04/03/2018  . Acute blood loss anemia 01/17/2018  . Gallstones 01/17/2018  . Weight loss, unintentional 01/17/2018  . Dilated bile duct   . Elevated liver function tests   . Gastritis and gastroduodenitis   . Complete heart block  ([place]) 11/09/2017  . S/P partial gastrectomy   . Peripheral neuropathy   . Hypertension   . Gastroparesis   . Duodenal ulcer disease   . Controlled type 2 diabetes mellitus with diabetic neuropathy, with long-term current use of insulin ([place])   . Depression   . Biliary gastritis   . Seasonal allergies   . Presence of cardiac pacemaker   . Gout   . Cervical spine arthritis 04/05/2016  . Long-term insulin use in type 2 diabetes ([place]) 04/05/2016    Past Surgical History:  Procedure Laterality Date  . COLONOSCOPY  02/11/2018   no polyps, + small internal hemorrhoids  . EUS N/A 12/12/2017   Procedure: UPPER ENDOSCOPIC ULTRASOUND (EUS) RADIAL;  Surgeon: [name], MD;  Location: [facility];  Service: Endoscopy;  Laterality: N/A;  . GASTRECTOMY    . JOINT REPLACEMENT    . pace maker    . PARTIAL GASTRECTOMY    . TONSILLECTOMY     45 years ago  . UPPER GASTROINTESTINAL ENDOSCOPY       OB History   No obstetric history on file.      Home Medications    Prior to Admission medications   Medication Sig Start Date End Date Taking? Authorizing Provider  Biotin 1000 MCG tablet Take  1,000 mcg by mouth daily.   Yes [provider]  carvedilol (COREG) 12.5 MG tablet TAKE 1 TABLET (12.5 MG TOTAL) 2 (TWO) TIMES DAILY WITH A MEAL BY MOUTH. 04/28/18  Yes Rutherford Guys, MD  colchicine 0.6 MG tablet Take 0.6 mg by mouth daily.   Yes [provider]  diclofenac sodium (VOLTAREN) 1 % GEL Apply 2 g topically 4 (four) times daily. 01/24/18  Yes Rutherford Guys, MD  diphenhydrAMINE (BENADRYL) 25 mg capsule Take 1 capsule (25 mg total) by mouth daily as needed for itching. 05/16/18  Yes Rutherford Guys, MD  DULoxetine (CYMBALTA) 60 MG capsule TAKE 1 CAPSULE (60 MG TOTAL) DAILY BY MOUTH. 04/28/18  Yes Rutherford Guys, MD  ferrous sulfate 325 (65 FE) MG tablet TAKE 1 TABLET BY MOUTH EVERY DAY WITH BREAKFAST 02/26/18  Yes Rutherford Guys, MD  gabapentin (NEURONTIN) 100 MG  capsule Take 1 capsule (100 mg) every morning and afternoon, and take 4 capsules (49m) at bedtime Patient taking differently: Take 100-400 mg by mouth 4 (four) times daily. 100 mg in the morning, 100 mg in the afternoon & 400 mg at bedtime 04/03/18  Yes SRutherford Guys MD  Insulin Glargine (LANTUS SOLOSTAR) 100 UNIT/ML Solostar Pen Inject 26 Units into the skin daily at 10 pm. Patient taking differently: Inject 22 Units into the skin daily.  01/24/18  Yes SRutherford Guys MD  insulin lispro (HUMALOG) 100 UNIT/ML injection Inject 1-10 Units into the skin 3 (three) times daily before meals. Sliding scale   Yes [provider]  losartan (COZAAR) 100 MG tablet Take 1 tablet (100 mg total) by mouth daily. 11/19/18  Yes Croitoru, Mihai, MD  Multiple Vitamin (MULTIVITAMIN) tablet Take 1 tablet daily by mouth.   Yes [provider]  pantoprazole (PROTONIX) 40 MG tablet TAKE 1 TABLET (40 MG TOTAL) DAILY BY MOUTH. 04/28/18  Yes SRutherford Guys MD  ranitidine (ZANTAC) 150 MG capsule Take 1 capsule (150 mg total) at bedtime by mouth. 10/30/17  Yes SRutherford Guys MD  blood glucose meter kit and supplies KIT Dispense based on patient and insurance preference. Check capillary glucose three times a day, before meals.  Dx E11.65, Z79.4 10/29/17   SRutherford Guys MD  glucose blood (ONE TOUCH ULTRA TEST) test strip USE TO CHECK GLUCOSE LEVELS 3 TIMES A DAY, DX E11.9, Z79.4 11/30/17   SRutherford Guys MD  Insulin Pen Needle (B-D ULTRAFINE III SHORT PEN) 31G X 8 MM MISC To use as directed with insulin, four times a day. Dx E11.9, Z79.4 11/08/17   SRutherford Guys MD  triamcinolone cream (KENALOG) 0.5 % Apply 1 application topically 2 (two) times daily. 09/22/18   SRutherford Guys MD    Family History Family History  Problem Relation Age of Onset  . Diabetes Mother   . Diabetes Father   . Heart disease Father   . Diabetes Sister   . Heart disease Son   . Colon cancer Neg Hx   . Colon  polyps Neg Hx   . Rectal cancer Neg Hx   . Stomach cancer Neg Hx   . Esophageal cancer Neg Hx     Social History Social History   Tobacco Use  . Smoking status: Never Smoker  . Smokeless tobacco: Never Used  Substance Use Topics  . Alcohol use: No    Alcohol/week: 0.0 standard drinks  . Drug use: No     Allergies   ADiona Fanti[  aspirin]; Tylenol [acetaminophen]; and Sulfa antibiotics   Review of Systems Review of Systems  All other systems reviewed and are negative.    Physical Exam Updated Vital Signs BP (!) 165/68   Pulse 67   Temp 98.1 F (36.7 C) (Oral)   Resp 12   Ht '5\' 3"'  (1.6 m)   Wt 54.4 kg   SpO2 100%   BMI 21.26 kg/m   Physical Exam Vitals signs and nursing note reviewed.  Constitutional:      General: She is not in acute distress.    Appearance: She is well-developed and normal weight. She is not ill-appearing or diaphoretic.     Comments: Elderly, frail  HENT:     Head: Normocephalic and atraumatic.     Right Ear: External ear normal.     Left Ear: External ear normal.  Eyes:     Conjunctiva/sclera: Conjunctivae normal.     Pupils: Pupils are equal, round, and reactive to light.  Neck:     Musculoskeletal: Normal range of motion and neck supple.     Trachea: Phonation normal.  Cardiovascular:     Rate and Rhythm: Normal rate and regular rhythm.     Heart sounds: Normal heart sounds.  Pulmonary:     Effort: Pulmonary effort is normal.     Breath sounds: Normal breath sounds.  Abdominal:     Palpations: Abdomen is soft.     Tenderness: There is no abdominal tenderness.  Musculoskeletal: Normal range of motion.  Skin:    General: Skin is warm and dry.  Neurological:     Mental Status: She is alert and oriented to person, place, and time.     Cranial Nerves: No cranial nerve deficit.     Sensory: No sensory deficit.     Motor: No abnormal muscle tone.     Coordination: Coordination normal.  Psychiatric:        Mood and Affect: Mood normal.         Behavior: Behavior normal.        Thought Content: Thought content normal.        Judgment: Judgment normal.      ED Treatments / Results  Labs (all labs ordered are listed, but only abnormal results are displayed) Labs Reviewed  COMPREHENSIVE METABOLIC PANEL - Abnormal; Notable for the following components:      Result Value   Glucose, Bld 130 (*)    GFR calc non Af Amer 56 (*)    Anion gap 16 (*)    All other components within normal limits  CBC WITH DIFFERENTIAL/PLATELET - Abnormal; Notable for the following components:   WBC 12.3 (*)    Hemoglobin 15.4 (*)    HCT 48.6 (*)    Neutro Abs 9.3 (*)    All other components within normal limits  URINALYSIS, ROUTINE W REFLEX MICROSCOPIC - Abnormal; Notable for the following components:   Ketones, ur 20 (*)    All other components within normal limits  CBG MONITORING, ED - Abnormal; Notable for the following components:   Glucose-Capillary 128 (*)    All other components within normal limits  I-STAT VENOUS BLOOD GAS, ED - Abnormal; Notable for the following components:   pH, Ven 7.477 (*)    pCO2, Ven 36.5 (*)    pO2, Ven 51.0 (*)    Acid-Base Excess 4.0 (*)    All other components within normal limits  LIPASE, BLOOD  BLOOD GAS, VENOUS    EKG EKG Interpretation  Date/Time:  Sunday January 04 2019 10:21:03 EST Ventricular Rate:  73 PR Interval:    QRS Duration: 160 QT Interval:  486 QTC Calculation: 536 R Axis:   -87 Text Interpretation:  Suspect nonvisualized atrial sensed ventricular pacing Repeat with pacer sensing Confirmed by Daleen Bo [phone]) on 01/04/2019 10:30:03 AM    EKG Interpretation  Date/Time:  Sunday January 04 2019 10:38:12 EST Ventricular Rate:  76 PR Interval:    QRS Duration: 151 QT Interval:  461 QTC Calculation: 519 R Axis:   -84 Text Interpretation:  Atrial-sensed ventricular-paced rhythm No further analysis attempted due to paced rhythm Baseline wander in lead(s) I III aVL  similar to prior tracings Confirmed by Daleen Bo [phone]) on 01/04/2019 10:45:25 AM        Radiology No results found.  Procedures Procedures (including critical care time)  Medications Ordered in ED Medications  sodium chloride 0.9 % bolus 1,000 mL (0 mLs Intravenous Stopped 01/04/19 1345)     Initial Impression / Assessment and Plan / ED Course  I have reviewed the triage vital signs and the nursing notes.  Pertinent labs & imaging results that were available during my care of the patient were reviewed by me and considered in my medical decision making (see chart for details).  Clinical Course as of Jan 04 1445  Sun Jan 04, 2019  1357 Mild elevation  CBG monitoring, ED(!) [EW]  1357 Normal  Lipase, blood [EW]  1357 Normal except white count high, hemoglobin high  CBC with Differential(!) [EW]  1357 Normal except pH high, CO2 low, PO2 high  I-Stat venous blood gas, ED(!) [EW]  1358 Normal except glucose high  Comprehensive metabolic panel(!) [EW]  0981 Normal  Urinalysis, Routine w reflex microscopic(!) [EW]    Clinical Course User Index [EW] Daleen Bo, MD     Patient Vitals for the past 24 hrs:  BP Temp Temp src Pulse Resp SpO2 Height Weight  01/04/19 1400 (!) 165/68 - - 67 12 100 % - -  01/04/19 1300 (!) 172/67 - - 67 10 100 % - -  01/04/19 1200 (!) 178/59 - - 65 10 99 % - -  01/04/19 1130 (!) 174/65 - - 69 11 100 % - -  01/04/19 1100 (!) 174/67 - - 68 15 100 % - -  01/04/19 1030 (!) 162/63 - - 77 15 100 % - -  01/04/19 1022 - - - - - - '5\' 3"'  (1.6 m) 54.4 kg  01/04/19 1021 (!) 173/69 98.1 F (36.7 C) Oral 71 12 99 % - -    2:42 PM Reevaluation with update and discussion. After initial assessment and treatment, an updated evaluation reveals she is comfortable has been tolerating some oral nutrition.  Findings discussed with the patient and all questions were answered. Daleen Bo   Medical Decision Making: Malaise following recent GI illness.  GI  symptoms have resolved.  Screening evaluation for acute infectious or metabolic problems is reassuring.  No evidence for need for further evaluation in the ED or hospitalization.  CRITICAL CARE-no Performed by: Daleen Bo  Nursing Notes Reviewed/ Care Coordinated Applicable Imaging Reviewed Interpretation of Laboratory Data incorporated into ED treatment  The patient appears reasonably screened and/or stabilized for discharge and I doubt any other medical condition or other [facility] requiring further screening, evaluation, or treatment in the ED at this time prior to discharge.  Plan: Home Medications-continue usual medications; Home Treatments-push oral fluids and fluids; return here if the recommended treatment,  does not improve the symptoms; Recommended follow up-PCP follow-up if not better in 2 to 3 days.   Final Clinical Impressions(s) / ED Diagnoses   Final diagnoses:  [facility]    ED Discharge Orders    None       Daleen Bo, MD 01/04/19 1447

## 2019-01-06 DIAGNOSIS — L089 Local infection of the skin and subcutaneous tissue, unspecified: Secondary | ICD-10-CM | POA: Diagnosis not present

## 2019-01-06 DIAGNOSIS — L080 Pyoderma: Secondary | ICD-10-CM | POA: Diagnosis not present

## 2019-01-06 DIAGNOSIS — L309 Dermatitis, unspecified: Secondary | ICD-10-CM | POA: Diagnosis not present

## 2019-01-16 ENCOUNTER — Telehealth: Payer: Self-pay

## 2019-01-16 NOTE — Telephone Encounter (Signed)
Yes thanks 

## 2019-01-16 NOTE — Telephone Encounter (Signed)
Pt refusing triamcinolone cream Now using mupirocin ointment. [name] is asking can they discontinue the triamcinolone cream?

## 2019-01-19 LAB — CUP PACEART INCLINIC DEVICE CHECK
Date Time Interrogation Session: 20200127142656
Implantable Lead Implant Date: 20110617
Implantable Lead Location: 753859
Implantable Lead Location: 753860
Implantable Lead Model: 4469
Implantable Lead Serial Number: 28708376
Implantable Lead Serial Number: 484167
Implantable Pulse Generator Implant Date: 20110617
MDC IDC LEAD IMPLANT DT: 20070701
Pulse Gen Serial Number: 157425

## 2019-01-20 ENCOUNTER — Encounter (INDEPENDENT_AMBULATORY_CARE_PROVIDER_SITE_OTHER): Payer: Medicare Other | Admitting: Ophthalmology

## 2019-01-22 ENCOUNTER — Ambulatory Visit (INDEPENDENT_AMBULATORY_CARE_PROVIDER_SITE_OTHER): Payer: Medicare Other | Admitting: Family Medicine

## 2019-01-22 ENCOUNTER — Encounter: Payer: Self-pay | Admitting: Family Medicine

## 2019-01-22 ENCOUNTER — Other Ambulatory Visit: Payer: Self-pay

## 2019-01-22 VITALS — BP 170/67 | HR 71 | Temp 98.5°F | Ht 63.0 in | Wt 122.6 lb

## 2019-01-22 DIAGNOSIS — I10 Essential (primary) hypertension: Secondary | ICD-10-CM | POA: Diagnosis not present

## 2019-01-22 DIAGNOSIS — Z794 Long term (current) use of insulin: Secondary | ICD-10-CM | POA: Diagnosis not present

## 2019-01-22 DIAGNOSIS — Z23 Encounter for immunization: Secondary | ICD-10-CM | POA: Diagnosis not present

## 2019-01-22 DIAGNOSIS — K3184 Gastroparesis: Secondary | ICD-10-CM

## 2019-01-22 DIAGNOSIS — D508 Other iron deficiency anemias: Secondary | ICD-10-CM | POA: Diagnosis not present

## 2019-01-22 DIAGNOSIS — E118 Type 2 diabetes mellitus with unspecified complications: Secondary | ICD-10-CM

## 2019-01-22 LAB — POCT GLYCOSYLATED HEMOGLOBIN (HGB A1C): Hemoglobin A1C: 6.8 % — AB (ref 4.0–5.6)

## 2019-01-22 MED ORDER — CARVEDILOL 25 MG PO TABS: 25.0000 mg | ORAL_TABLET | Freq: Two times a day (BID) | ORAL | 0 refills | Status: DC

## 2019-01-22 NOTE — Patient Instructions (Signed)
° ° ° °  If you have lab work done today you will be contacted with your lab results within the next 2 weeks.  If you have not heard from us then please contact us. The fastest way to get your results is to register for My Chart. ° ° °IF you received an x-ray today, you will receive an invoice from [facility]. Please contact [facility] at [phone] with questions or concerns regarding your invoice.  ° °IF you received labwork today, you will receive an invoice from [facility]. Please contact [facility] at [phone] with questions or concerns regarding your invoice.  ° °Our billing staff will not be able to assist you with questions regarding bills from these companies. ° °You will be contacted with the lab results as soon as they are available. The fastest way to get your results is to activate your My Chart account. Instructions are located on the last page of this paperwork. If you have not heard from us regarding the results in 2 weeks, please contact this office. °  ° ° ° °

## 2019-01-22 NOTE — Progress Notes (Signed)
1/30/20209:59 AM  Judith [name] 03-08-1941, 78 y.o. female [id number]  Chief Complaint  Patient presents with  . Diabetes    HPI:   Patient is a 78 y.o. female with past medical history significant for DM2 insulin dependent, HTN, gastroparesis, polyarthritis, gastric ulcers who presents today for routine follow up  Still having contact with endo Dr Romelle Starcher, does not recall last OV nor upcoming appt, nursing home calls if they have questions  Saw derm - biopsy of rash, + staph infection, started on doxy Saw cards in nov 2019 - increase losartan 139m daily, plan was for FU with me and increase coreg if needed In ER a couple weeks ago for dehydration due to stomach GI  lantus 22 units once a day  Had cataract surgery with Dr HSeward MethDr MIvan Croftnext week  Neuropathy well controlled on gabapentin Denies any issues with gastroparesis of recent  Wondering if she still needs to be on KCl  Not using walker anymore Overall doing well  Lab Results  Component Value Date   HGBA1C 6.1 01/24/2018   HGBA1C 6.6 (H) 10/29/2017   Lab Results  Component Value Date   LDLCALC 88 10/29/2017   CREATININE 0.97 01/04/2019   Urine micro/cret ratio: July 2019, negative  Fall Risk  01/22/2019 10/09/2018 09/22/2018 06/05/2018 04/24/2018  Falls in the past year? 0 Yes Yes No Yes  Number falls in past yr: - 1 1 - 2 or more  Injury with Fall? - No No - No  Comment - - - - -  Risk Factor Category  - - - - -  Risk for fall due to : - - - - -  Follow up - - - - -     [facility] 01/22/2019 10/09/2018 09/22/2018  Decreased Interest 0 0 0  Down, Depressed, Hopeless 0 0 0  PHQ - 2 Score 0 0 0  Altered sleeping - - -  Tired, decreased energy - - -  Change in appetite - - -  Feeling bad or failure about yourself  - - -  Trouble concentrating - - -  Moving slowly or fidgety/restless - - -  Suicidal thoughts - - -  PHQ-9 Score - - -  Difficult doing work/chores - - -     Allergies  Allergen Reactions  . Asa [Aspirin] Other (See Comments)    Causes ulcers   . Tylenol [Acetaminophen]     Intolerance due to liver damage  . Sulfa Antibiotics Rash    Prior to Admission medications   Medication Sig Start Date End Date Taking? Authorizing Provider  Biotin 1000 MCG tablet Take 1,000 mcg by mouth daily.   Yes [provider]  blood glucose meter kit and supplies KIT Dispense based on patient and insurance preference. Check capillary glucose three times a day, before meals.  Dx E11.65, Z79.4 10/29/17  Yes SRutherford Guys MD  brimonidine ([facility] 0.2 % ophthalmic solution  12/24/18  Yes [provider]  carvedilol (COREG) 12.5 MG tablet TAKE 1 TABLET (12.5 MG TOTAL) 2 (TWO) TIMES DAILY WITH A MEAL BY MOUTH. 04/28/18  Yes SRutherford Guys MD  colchicine 0.6 MG tablet Take 0.6 mg by mouth daily.   Yes [provider]  diclofenac sodium (VOLTAREN) 1 % GEL Apply 2 g topically 4 (four) times daily. 01/24/18  Yes SRutherford Guys MD  diphenhydrAMINE (BENADRYL) 25 mg capsule Take 1 capsule (25 mg total) by mouth daily as needed for  itching. 05/16/18  Yes Rutherford Guys, MD  doxycycline (VIBRAMYCIN) 100 MG capsule  01/21/19  Yes [provider]  DULoxetine (CYMBALTA) 60 MG capsule TAKE 1 CAPSULE (60 MG TOTAL) DAILY BY MOUTH. 04/28/18  Yes Rutherford Guys, MD  ferrous sulfate 325 (65 FE) MG tablet TAKE 1 TABLET BY MOUTH EVERY DAY WITH BREAKFAST 02/26/18  Yes Rutherford Guys, MD  gabapentin (NEURONTIN) 100 MG capsule Take 1 capsule (100 mg) every morning and afternoon, and take 4 capsules (437m) at bedtime Patient taking differently: Take 100-400 mg by mouth 4 (four) times daily. 100 mg in the morning, 100 mg in the afternoon & 400 mg at bedtime 04/03/18  Yes SRutherford Guys MD  glucose blood (ONE TOUCH ULTRA TEST) test strip USE TO CHECK GLUCOSE LEVELS 3 TIMES A DAY, DX E11.9, Z79.4 11/30/17  Yes SRutherford Guys MD  Insulin Glargine  (LANTUS SOLOSTAR) 100 UNIT/ML Solostar Pen Inject 26 Units into the skin daily at 10 pm. Patient taking differently: Inject 22 Units into the skin daily.  01/24/18  Yes SRutherford Guys MD  insulin lispro (HUMALOG) 100 UNIT/ML injection Inject 1-10 Units into the skin 3 (three) times daily before meals. Sliding scale   Yes [provider]  Insulin Pen Needle (B-D ULTRAFINE III SHORT PEN) 31G X 8 MM MISC To use as directed with insulin, four times a day. Dx E11.9, Z79.4 11/08/17  Yes SRutherford Guys MD  ketorolac (ACULAR) 0.4 % SOLN  01/11/19  Yes [provider]  ketorolac (ACULAR) 0.5 % ophthalmic solution  11/19/18  Yes [provider]  losartan (COZAAR) 100 MG tablet Take 1 tablet (100 mg total) by mouth daily. 11/19/18  Yes Croitoru, Mihai, MD  Multiple Vitamin (MULTIVITAMIN) tablet Take 1 tablet daily by mouth.   Yes [provider]  mupirocin ointment (BACTROBAN) 2 %  01/06/19  Yes [provider]  ofloxacin (OCUFLOX) 0.3 % ophthalmic solution  01/01/19  Yes [provider]  pantoprazole (PROTONIX) 40 MG tablet TAKE 1 TABLET (40 MG TOTAL) DAILY BY MOUTH. 04/28/18  Yes SRutherford Guys MD  prednisoLONE acetate (PRED FORTE) 1 % ophthalmic suspension  12/24/18  Yes [provider]  ranitidine (ZANTAC) 150 MG capsule Take 1 capsule (150 mg total) at bedtime by mouth. 10/30/17  Yes SRutherford Guys MD  ranitidine (ZANTAC) 150 MG tablet  01/01/19  Yes [provider]  triamcinolone cream (KENALOG) 0.5 % Apply 1 application topically 2 (two) times daily. 09/22/18  Yes SRutherford Guys MD    Past Medical History:  Diagnosis Date  . Anemia   . Arthritis   . Biliary gastritis   . Blood transfusion without reported diagnosis    2007  . Depression   . Diabetes mellitus type 2 with complications ([place]   . Duodenal ulcer disease   . [place] for long[place]term ([place]) use of insulin ([place]   . Gastric ulcer   . Gastroparesis    severe   . GERD (gastroesophageal reflux disease)   . Gout    tested for gout but was not  . Heart murmur   . Hyperlipidemia    in past not now  . Hypertension   . Hypokalemia, gastrointestinal losses    secondary to severe gastroparesis  . Osteoporosis    just had bone density test possible   . Peripheral neuropathy   . Presence of cardiac pacemaker   . S/P partial gastrectomy    due to severe  gastric and gudoenal ulcers  . Seasonal allergies   . Sleep apnea    could not use CPAP  . UTI (urinary tract infection)     Past Surgical History:  Procedure Laterality Date  . COLONOSCOPY  02/11/2018   no polyps, + small internal hemorrhoids  . EUS N/A 12/12/2017   Procedure: UPPER ENDOSCOPIC ULTRASOUND (EUS) RADIAL;  Surgeon: [name], MD;  Location: [facility];  Service: Endoscopy;  Laterality: N/A;  . GASTRECTOMY    . JOINT REPLACEMENT    . pace maker    . PARTIAL GASTRECTOMY    . TONSILLECTOMY     45 years ago  . UPPER GASTROINTESTINAL ENDOSCOPY      Social History   Tobacco Use  . Smoking status: Never Smoker  . Smokeless tobacco: Never Used  Substance Use Topics  . Alcohol use: No    Alcohol/week: 0.0 standard drinks    Family History  Problem Relation Age of Onset  . Diabetes Mother   . Diabetes Father   . Heart disease Father   . Diabetes Sister   . Heart disease Son   . Colon cancer Neg Hx   . Colon polyps Neg Hx   . Rectal cancer Neg Hx   . Stomach cancer Neg Hx   . Esophageal cancer Neg Hx     Review of Systems  Constitutional: Negative for chills and fever.  Respiratory: Negative for cough and shortness of breath.   Cardiovascular: Negative for chest pain, palpitations and leg swelling.  Gastrointestinal: Negative for abdominal pain, nausea and vomiting.     OBJECTIVE:  Blood pressure (!) 170/67, pulse 71, temperature 98.5 F (36.9 C), temperature source Oral, height '5\' 3"'  (1.6 m), weight 122 lb 9.6 oz (55.6 kg), SpO2 98 %. Body mass  index is 21.72 kg/m.   BP Readings from Last 3 Encounters:  01/22/19 (!) 170/67  01/04/19 (!) 165/59  11/19/18 (!) 175/77    Wt Readings from Last 3 Encounters:  01/22/19 122 lb 9.6 oz (55.6 kg)  01/04/19 120 lb (54.4 kg)  11/19/18 120 lb 12.8 oz (54.8 kg)    Physical Exam Vitals signs and nursing note reviewed.  Constitutional:      Appearance: She is well-developed.  HENT:     Head: Normocephalic and atraumatic.     Mouth/Throat:     Pharynx: No oropharyngeal exudate.  Eyes:     General: No scleral icterus.    Conjunctiva/sclera: Conjunctivae normal.     Pupils: Pupils are equal, round, and reactive to light.  Neck:     Musculoskeletal: Neck supple.  Cardiovascular:     Rate and Rhythm: Normal rate and regular rhythm.     Heart sounds: Normal heart sounds. No murmur. No friction rub. No gallop.   Pulmonary:     Effort: Pulmonary effort is normal.     Breath sounds: Normal breath sounds. No wheezing or rales.  Musculoskeletal:     Right lower leg: No edema.     Left lower leg: No edema.  Skin:    General: Skin is warm and dry.  Neurological:     Mental Status: She is alert and oriented to person, place, and time.     Results for orders placed or performed in visit on 01/22/19 (from the past 24 hour(s))  POCT glycosylated hemoglobin (Hb A1C)     Status: Abnormal   Collection Time: 01/22/19 10:24 AM  Result Value Ref Range   Hemoglobin A1C 6.8 (A)  4.0 - 5.6 %   HbA1c POC (<> result, manual entry)     HbA1c, POC (prediabetic range)     HbA1c, POC (controlled diabetic range)        ASSESSMENT and PLAN  1. Type 2 diabetes mellitus with complication, with long-term current use of insulin (HCC) Controlled. Cont with current regime.  - POCT glycosylated hemoglobin (Hb A1C) - CMP14+EGFR - Lipid panel - TSH  2. Essential hypertension Uncontrolled. Increasing carvedilol.  - CMP14+EGFR - Lipid panel - TSH - Care order/instruction:  3. Need for  vaccination - Pneumococcal polysaccharide vaccine 23-valent greater than or equal to 2yo subcutaneous/IM  4. Other iron deficiency anemia Checking CBC today. Secondary to GIB. Cont gastric protection - CBC  5. Gastroparesis Controlled. Denies any symptoms. Cont diet.    Return in about 4 weeks (around 02/19/2019) for HTN.    Rutherford Guys, MD Primary [facility] at [place], [place] [address] Ph.  [phone] Fax [phone]

## 2019-01-23 LAB — CMP14+EGFR
ALT: 9 IU/L (ref 0–32)
AST: 17 IU/L (ref 0–40)
Albumin/Globulin Ratio: 1.5 (ref 1.2–2.2)
Albumin: 3.5 g/dL — ABNORMAL LOW (ref 3.7–4.7)
Alkaline Phosphatase: 119 IU/L — ABNORMAL HIGH (ref 39–117)
BUN/Creatinine Ratio: 16 (ref 12–28)
BUN: 13 mg/dL (ref 8–27)
Bilirubin Total: 0.3 mg/dL (ref 0.0–1.2)
CO2: 22 mmol/L (ref 20–29)
Calcium: 8.5 mg/dL — ABNORMAL LOW (ref 8.7–10.3)
Chloride: 100 mmol/L (ref 96–106)
Creatinine, Ser: 0.8 mg/dL (ref 0.57–1.00)
GFR calc Af Amer: 82 mL/min/{1.73_m2} (ref >59)
GFR calc non Af Amer: 71 mL/min/{1.73_m2} (ref >59)
Globulin, Total: 2.3 g/dL (ref 1.5–4.5)
Glucose: 208 mg/dL — ABNORMAL HIGH (ref 65–99)
Potassium: 4.3 mmol/L (ref 3.5–5.2)
Sodium: 140 mmol/L (ref 134–144)
Total Protein: 5.8 g/dL — ABNORMAL LOW (ref 6.0–8.5)

## 2019-01-23 LAB — CBC
Hematocrit: 37.1 % (ref 34.0–46.6)
Hemoglobin: 12.8 g/dL (ref 11.1–15.9)
MCH: 31.8 pg (ref 26.6–33.0)
MCHC: 34.5 g/dL (ref 31.5–35.7)
MCV: 92 fL (ref 79–97)
Platelets: 304 10*3/uL (ref 150–450)
RBC: 4.02 x10E6/uL (ref 3.77–5.28)
RDW: 12.7 % (ref 11.7–15.4)
WBC: 11 10*3/uL — ABNORMAL HIGH (ref 3.4–10.8)

## 2019-01-23 LAB — LIPID PANEL
Chol/HDL Ratio: 2.7 ratio (ref 0.0–4.4)
Cholesterol, Total: 125 mg/dL (ref 100–199)
HDL: 46 mg/dL (ref >39)
LDL Calculated: 65 mg/dL (ref 0–99)
Triglycerides: 72 mg/dL (ref 0–149)
VLDL Cholesterol Cal: 14 mg/dL (ref 5–40)

## 2019-01-23 LAB — TSH: TSH: 1.6 u[IU]/mL (ref 0.450–4.500)

## 2019-01-27 ENCOUNTER — Encounter (INDEPENDENT_AMBULATORY_CARE_PROVIDER_SITE_OTHER): Payer: Medicare Other | Admitting: Ophthalmology

## 2019-01-27 DIAGNOSIS — H35033 Hypertensive retinopathy, bilateral: Secondary | ICD-10-CM

## 2019-01-27 DIAGNOSIS — E11311 Type 2 diabetes mellitus with unspecified diabetic retinopathy with macular edema: Secondary | ICD-10-CM

## 2019-01-27 DIAGNOSIS — I10 Essential (primary) hypertension: Secondary | ICD-10-CM

## 2019-01-27 DIAGNOSIS — E113313 Type 2 diabetes mellitus with moderate nonproliferative diabetic retinopathy with macular edema, bilateral: Secondary | ICD-10-CM

## 2019-01-27 DIAGNOSIS — H43813 Vitreous degeneration, bilateral: Secondary | ICD-10-CM | POA: Diagnosis not present

## 2019-01-29 ENCOUNTER — Ambulatory Visit: Payer: Self-pay

## 2019-01-29 ENCOUNTER — Ambulatory Visit: Payer: Medicare Other

## 2019-01-29 ENCOUNTER — Encounter: Payer: Self-pay | Admitting: Family Medicine

## 2019-01-29 LAB — HM DIABETES EYE EXAM

## 2019-02-03 ENCOUNTER — Ambulatory Visit: Payer: Medicare Other

## 2019-02-10 ENCOUNTER — Telehealth: Payer: Self-pay

## 2019-02-10 NOTE — Telephone Encounter (Signed)
Forms were faxed bk to [facility] for dic of medication due to refusal

## 2019-03-05 ENCOUNTER — Other Ambulatory Visit: Payer: Self-pay

## 2019-03-05 ENCOUNTER — Ambulatory Visit (INDEPENDENT_AMBULATORY_CARE_PROVIDER_SITE_OTHER): Payer: Medicare Other | Admitting: Family Medicine

## 2019-03-05 ENCOUNTER — Encounter: Payer: Self-pay | Admitting: Family Medicine

## 2019-03-05 VITALS — BP 118/70 | HR 64 | Temp 98.0°F | Ht 63.0 in | Wt 123.0 lb

## 2019-03-05 DIAGNOSIS — R531 Weakness: Secondary | ICD-10-CM | POA: Diagnosis not present

## 2019-03-05 DIAGNOSIS — L249 Irritant contact dermatitis, unspecified cause: Secondary | ICD-10-CM | POA: Diagnosis not present

## 2019-03-05 DIAGNOSIS — L851 Acquired keratosis [keratoderma] palmaris et plantaris: Secondary | ICD-10-CM | POA: Diagnosis not present

## 2019-03-05 DIAGNOSIS — E1121 Type 2 diabetes mellitus with diabetic nephropathy: Secondary | ICD-10-CM | POA: Diagnosis not present

## 2019-03-05 DIAGNOSIS — B351 Tinea unguium: Secondary | ICD-10-CM | POA: Diagnosis not present

## 2019-03-05 DIAGNOSIS — E118 Type 2 diabetes mellitus with unspecified complications: Secondary | ICD-10-CM

## 2019-03-05 DIAGNOSIS — Z794 Long term (current) use of insulin: Secondary | ICD-10-CM | POA: Diagnosis not present

## 2019-03-05 DIAGNOSIS — L603 Nail dystrophy: Secondary | ICD-10-CM | POA: Diagnosis not present

## 2019-03-05 DIAGNOSIS — D508 Other iron deficiency anemias: Secondary | ICD-10-CM | POA: Diagnosis not present

## 2019-03-05 DIAGNOSIS — I10 Essential (primary) hypertension: Secondary | ICD-10-CM | POA: Diagnosis not present

## 2019-03-05 DIAGNOSIS — R269 Unspecified abnormalities of gait and mobility: Secondary | ICD-10-CM | POA: Diagnosis not present

## 2019-03-05 DIAGNOSIS — M79673 Pain in unspecified foot: Secondary | ICD-10-CM | POA: Diagnosis not present

## 2019-03-05 NOTE — Patient Instructions (Signed)
° ° ° °  If you have lab work done today you will be contacted with your lab results within the next 2 weeks.  If you have not heard from us then please contact us. The fastest way to get your results is to register for My Chart. ° ° °IF you received an x-ray today, you will receive an invoice from [facility]. Please contact [facility] at [phone] with questions or concerns regarding your invoice.  ° °IF you received labwork today, you will receive an invoice from [facility]. Please contact [facility] at [phone] with questions or concerns regarding your invoice.  ° °Our billing staff will not be able to assist you with questions regarding bills from these companies. ° °You will be contacted with the lab results as soon as they are available. The fastest way to get your results is to activate your My Chart account. Instructions are located on the last page of this paperwork. If you have not heard from us regarding the results in 2 weeks, please contact this office. °  ° ° ° °

## 2019-03-05 NOTE — Progress Notes (Signed)
3/12/202010:40 AM  Judith [name] 1941-02-22, 78 y.o. female [id number]  Chief Complaint  Patient presents with  . Hypertension    no medical concerns, declines dexa scan    HPI:   Patient is a 78 y.o. female with past medical history significant for DM2 insulin dependent, HTN, gastroparesis, polyarthritis, gastric ulcers who presents today for routine follow up  Last OV Jan 2020 Increased carvedilol at that visit to 12.83m BID Tolerating new dose well, denies any side effects Lives in [place] Overall she states she is doing well She will be seeing derm in 2 weeks as rash did not resolve She sees endo for her diabetes, needs to make an appt her sugars have been running a bit high Still seeing Dr MZigmund Danielfor eye injections for retinopathy Neuropathy and gastroparesis are well controlled Denies any falls. Does not need walker anymore No acute concerns today Needs no refills today  CBC Latest Ref Rng & Units 01/22/2019 01/04/2019 04/22/2018  WBC 3.4 - 10.8 x10E3/uL 11.0(H) 12.3(H) 11.6(H)  Hemoglobin 11.1 - 15.9 g/dL 12.8 15.4(H) 11.8(L)  Hematocrit 34.0 - 46.6 % 37.1 48.6(H) 36.8  Platelets 150 - 450 x10E3/uL 304 301 302   Lab Results  Component Value Date   FERRITIN 146 12/28/2017    Lab Results  Component Value Date   CREATININE 0.80 01/22/2019   BUN 13 01/22/2019   NA 140 01/22/2019   K 4.3 01/22/2019   CL 100 01/22/2019   CO2 22 01/22/2019   Lab Results  Component Value Date   TSH 1.600 01/22/2019   Lab Results  Component Value Date   CHOL 125 01/22/2019   HDL 46 01/22/2019   LDLCALC 65 01/22/2019   TRIG 72 01/22/2019   CHOLHDL 2.7 01/22/2019    Fall Risk  03/05/2019 01/22/2019 10/09/2018 09/22/2018 06/05/2018  Falls in the past year? 0 0 Yes Yes No  Number falls in past yr: 0 - 1 1 -  Injury with Fall? 0 - No No -  Comment - - - - -  Risk Factor Category  - - - - -  Risk for fall due to : - - - - -  Follow up - - - - -     Dep[facility] screen [facility] 2/9 03/05/2019 01/22/2019 10/09/2018  Decreased Interest 0 0 0  Down, Depressed, Hopeless 0 0 0  PHQ - 2 Score 0 0 0  Altered sleeping - - -  Tired, decreased energy - - -  Change in appetite - - -  Feeling bad or failure about yourself  - - -  Trouble concentrating - - -  Moving slowly or fidgety/restless - - -  Suicidal thoughts - - -  PHQ-9 Score - - -  Difficult doing work/chores - - -    Allergies  Allergen Reactions  . Asa [Aspirin] Other (See Comments)    Causes ulcers   . Tylenol [Acetaminophen]     Intolerance due to liver damage  . Sulfa Antibiotics Rash    Prior to Admission medications   Medication Sig Start Date End Date Taking? Authorizing Provider  Biotin 1000 MCG tablet Take 1,000 mcg by mouth daily.   Yes [provider]  blood glucose meter kit and supplies KIT Dispense based on patient and insurance preference. Check capillary glucose three times a day, before meals.  Dx E11.65, Z79.4 10/29/17  Yes SRutherford Guys MD  brimonidine ([facility] 0.2 % ophthalmic solution  12/24/18  Yes [provider]  carvedilol (COREG) 25 MG tablet Take 1 tablet (25 mg total) by mouth 2 (two) times daily with a meal. 01/22/19  Yes Rutherford Guys, MD  colchicine 0.6 MG tablet Take 0.6 mg by mouth daily.   Yes [provider]  diclofenac sodium (VOLTAREN) 1 % GEL Apply 2 g topically 4 (four) times daily. 01/24/18  Yes Rutherford Guys, MD  diphenhydrAMINE (BENADRYL) 25 mg capsule Take 1 capsule (25 mg total) by mouth daily as needed for itching. 05/16/18  Yes Rutherford Guys, MD  doxycycline (VIBRAMYCIN) 100 MG capsule  01/21/19  Yes [provider]  DULoxetine (CYMBALTA) 60 MG capsule TAKE 1 CAPSULE (60 MG TOTAL) DAILY BY MOUTH. 04/28/18  Yes Rutherford Guys, MD  ferrous sulfate 325 (65 FE) MG tablet TAKE 1 TABLET BY MOUTH EVERY DAY WITH BREAKFAST 02/26/18  Yes Rutherford Guys, MD  gabapentin (NEURONTIN) 100 MG capsule Take 1 capsule (100 mg) every  morning and afternoon, and take 4 capsules (440m) at bedtime Patient taking differently: Take 100-400 mg by mouth 4 (four) times daily. 100 mg in the morning, 100 mg in the afternoon & 400 mg at bedtime 04/03/18  Yes SRutherford Guys MD  glucose blood (ONE TOUCH ULTRA TEST) test strip USE TO CHECK GLUCOSE LEVELS 3 TIMES A DAY, DX E11.9, Z79.4 11/30/17  Yes SRutherford Guys MD  Insulin Glargine (LANTUS SOLOSTAR) 100 UNIT/ML Solostar Pen Inject 26 Units into the skin daily at 10 pm. Patient taking differently: Inject 22 Units into the skin daily.  01/24/18  Yes SRutherford Guys MD  insulin lispro (HUMALOG) 100 UNIT/ML injection Inject 1-10 Units into the skin 3 (three) times daily before meals. Sliding scale   Yes [provider]  Insulin Pen Needle (B-D ULTRAFINE III SHORT PEN) 31G X 8 MM MISC To use as directed with insulin, four times a day. Dx E11.9, Z79.4 11/08/17  Yes SRutherford Guys MD  ketorolac (ACULAR) 0.4 % SOLN  01/11/19  Yes [provider]  ketorolac (ACULAR) 0.5 % ophthalmic solution  11/19/18  Yes [provider]  losartan (COZAAR) 100 MG tablet Take 1 tablet (100 mg total) by mouth daily. 11/19/18  Yes Croitoru, Mihai, MD  Multiple Vitamin (MULTIVITAMIN) tablet Take 1 tablet daily by mouth.   Yes [provider]  mupirocin ointment (BACTROBAN) 2 %  01/06/19  Yes [provider]  ofloxacin (OCUFLOX) 0.3 % ophthalmic solution  01/01/19  Yes [provider]  pantoprazole (PROTONIX) 40 MG tablet TAKE 1 TABLET (40 MG TOTAL) DAILY BY MOUTH. 04/28/18  Yes SRutherford Guys MD  prednisoLONE acetate (PRED FORTE) 1 % ophthalmic suspension  12/24/18  Yes [provider]  ranitidine (ZANTAC) 150 MG capsule Take 1 capsule (150 mg total) at bedtime by mouth. 10/30/17  Yes SRutherford Guys MD  ranitidine (ZANTAC) 150 MG tablet  01/01/19  Yes [provider]  triamcinolone cream (KENALOG) 0.5 % Apply 1 application topically 2 (two) times  daily. 09/22/18  Yes SRutherford Guys MD    Past Medical History:  Diagnosis Date  . Anemia   . Arthritis   . Biliary gastritis   . Blood transfusion without reported diagnosis    2007  . Depression   . Diabetes mellitus type 2 with complications ([place]   . Duodenal ulcer disease   . Encounter for long-term (current) use of insulin ([place]   . Gastric ulcer   . Gastroparesis    severe  .  GERD (gastroesophageal reflux disease)   . Gout    tested for gout but was not  . Heart murmur   . Hyperlipidemia    in past not now  . Hypertension   . Hypokalemia, gastrointestinal losses    secondary to severe gastroparesis  . Osteoporosis    just had bone density test possible   . Peripheral neuropathy   . Presence of cardiac pacemaker   . S/P partial gastrectomy    due to severe gastric and gudoenal ulcers  . Seasonal allergies   . Sleep apnea    could not use CPAP  . UTI (urinary tract infection)     Past Surgical History:  Procedure Laterality Date  . COLONOSCOPY  02/11/2018   no polyps, + small internal hemorrhoids  . EUS N/A 12/12/2017   Procedure: UPPER ENDOSCOPIC ULTRASOUND (EUS) RADIAL;  Surgeon: [name], MD;  Location: [facility];  Service: Endoscopy;  Laterality: N/A;  . GASTRECTOMY    . JOINT REPLACEMENT    . pace maker    . PARTIAL GASTRECTOMY    . TONSILLECTOMY     45 years ago  . UPPER GASTROINTESTINAL ENDOSCOPY      Social History   Tobacco Use  . Smoking status: Never Smoker  . Smokeless tobacco: Never Used  Substance Use Topics  . Alcohol use: No    Alcohol/week: 0.0 standard drinks    Family History  Problem Relation Age of Onset  . Diabetes Mother   . Diabetes Father   . Heart disease Father   . Diabetes Sister   . Heart disease Son   . Colon cancer Neg Hx   . Colon polyps Neg Hx   . Rectal cancer Neg Hx   . Stomach cancer Neg Hx   . Esophageal cancer Neg Hx     Review of Systems  Constitutional: Negative for chills and  fever.  Respiratory: Negative for cough and shortness of breath.   Cardiovascular: Negative for chest pain, palpitations and leg swelling.  Gastrointestinal: Negative for abdominal pain, nausea and vomiting.     OBJECTIVE:  Blood pressure 118/70, pulse 64, temperature 98 F (36.7 C), temperature source Oral, height '5\' 3"'  (1.6 m), weight 123 lb (55.8 kg), SpO2 96 %. Body mass index is 21.79 kg/m.   Diabetic Foot Exam - Simple   Simple Foot Form Visual Inspection No deformities, no ulcerations, no other skin breakdown bilaterally:  Yes Sensation Testing Intact to touch and monofilament testing bilaterally:  Yes Pulse Check Comments Felt all pricks with the filament no numbness or pain in the feet     Physical Exam Vitals signs and nursing note reviewed.  Constitutional:      Appearance: She is well-developed.  HENT:     Head: Normocephalic and atraumatic.     Mouth/Throat:     Pharynx: No oropharyngeal exudate.  Eyes:     General: No scleral icterus.    Conjunctiva/sclera: Conjunctivae normal.     Pupils: Pupils are equal, round, and reactive to light.  Neck:     Musculoskeletal: Neck supple.  Cardiovascular:     Rate and Rhythm: Normal rate and regular rhythm.     Heart sounds: Normal heart sounds. No murmur. No friction rub. No gallop.   Pulmonary:     Effort: Pulmonary effort is normal.     Breath sounds: Normal breath sounds. No wheezing or rales.  Skin:    General: Skin is warm and dry.  Neurological:  Mental Status: She is alert and oriented to person, place, and time.     ASSESSMENT and PLAN  1. Essential hypertension Controlled. Continue current regime.   2. Other iron deficiency anemia Last H/H normal. Stop iron supplement. Recheck labs at next visit  3. Irritant contact dermatitis, unspecified trigger Managed by derm.   4. General weakness Resolved. Back to walking independently, no recent falls  5. Type 2 diabetes mellitus with  complication, with long-term current use of insulin ([place]) DM managed by endo, normal foot exam today. Under retina specialist care.  Return in about 3 months (around 06/05/2019).    Rutherford Guys, MD Primary Care at [place], [place] [address] Ph.  [phone] Fax [phone]

## 2019-03-09 ENCOUNTER — Encounter (INDEPENDENT_AMBULATORY_CARE_PROVIDER_SITE_OTHER): Payer: Medicare Other | Admitting: Ophthalmology

## 2019-03-13 NOTE — Progress Notes (Signed)
Controlled dm with both eyes affected by moderate nonproliferated retinopathy and macular edema steble

## 2019-03-17 ENCOUNTER — Encounter (INDEPENDENT_AMBULATORY_CARE_PROVIDER_SITE_OTHER): Payer: Medicare Other | Admitting: Ophthalmology

## 2019-04-21 ENCOUNTER — Telehealth: Payer: Self-pay | Admitting: Cardiovascular Disease

## 2019-04-21 NOTE — Telephone Encounter (Signed)
Pt daughter states a video or a phone visit is fine however, if the pt needs to come in office to have her visit she would like to reschedule that visit. Today she is available for the rest of the day. If the scheduler call tomorrow she is only available before 11 am because she has to work.

## 2019-04-21 NOTE — Telephone Encounter (Signed)
Follow up ° °Patient's daughter is returning call. °

## 2019-04-21 NOTE — Telephone Encounter (Signed)
LVM for patient to call back with preference of video, phone or office visit.

## 2019-04-21 NOTE — Telephone Encounter (Signed)
New Message    Pts daughter is wondering if she needs to come for the pacer check   Please call back

## 2019-04-22 NOTE — Telephone Encounter (Signed)
Can she do remote downloads? If yes, let's make her a virtual visit with me now, please. Just make sure we have an updated download. She is due 04/20/2019. If no, reschedule for late July (6 months after last office check) with me. Thanks, [employer]

## 2019-04-29 ENCOUNTER — Telehealth: Payer: Medicare Other | Admitting: Cardiovascular Disease

## 2019-05-21 ENCOUNTER — Telehealth: Payer: Self-pay | Admitting: Cardiovascular Disease

## 2019-05-21 NOTE — Telephone Encounter (Signed)
New Message:    Pt's daughter wants to know if there is any way pt can be seen in the office next week? She says pt's pacemaker can not be checked from home.

## 2019-05-21 NOTE — Telephone Encounter (Signed)
Returned call to patient's daughter no answer.LMTC. 

## 2019-05-25 ENCOUNTER — Ambulatory Visit: Payer: Medicare Other

## 2019-05-25 ENCOUNTER — Other Ambulatory Visit: Payer: Self-pay

## 2019-05-25 NOTE — Telephone Encounter (Signed)
Spoke with pt dtr, in office appointment for July has been scheduled.

## 2019-05-27 ENCOUNTER — Encounter (INDEPENDENT_AMBULATORY_CARE_PROVIDER_SITE_OTHER): Payer: Medicare Other | Admitting: Ophthalmology

## 2019-06-04 DIAGNOSIS — Z20828 Contact with and (suspected) exposure to other viral communicable diseases: Secondary | ICD-10-CM | POA: Diagnosis not present

## 2019-06-08 DIAGNOSIS — Z20828 Contact with and (suspected) exposure to other viral communicable diseases: Secondary | ICD-10-CM | POA: Diagnosis not present

## 2019-06-11 ENCOUNTER — Telehealth: Payer: Self-pay | Admitting: Family Medicine

## 2019-06-11 ENCOUNTER — Telehealth (INDEPENDENT_AMBULATORY_CARE_PROVIDER_SITE_OTHER): Payer: Medicare Other | Admitting: Family Medicine

## 2019-06-11 DIAGNOSIS — K3184 Gastroparesis: Secondary | ICD-10-CM

## 2019-06-11 DIAGNOSIS — E1165 Type 2 diabetes mellitus with hyperglycemia: Secondary | ICD-10-CM

## 2019-06-11 DIAGNOSIS — I10 Essential (primary) hypertension: Secondary | ICD-10-CM

## 2019-06-11 DIAGNOSIS — Z950 Presence of cardiac pacemaker: Secondary | ICD-10-CM

## 2019-06-11 DIAGNOSIS — Z794 Long term (current) use of insulin: Secondary | ICD-10-CM

## 2019-06-11 MED ORDER — FAMOTIDINE 20 MG PO TABS: 20.0000 mg | ORAL_TABLET | Freq: Every day | ORAL | 3 refills | Status: DC

## 2019-06-11 NOTE — Telephone Encounter (Signed)
CALLED PT ,SPOKE WITH DAUGHTER .DAUGHTER WILL CALL BACK TO SCHEDULE FOLLOW FOR 3 MONTHS IN OV  IF POSSIBLE IF NOT, VIRTUAL  FROM TODAYS VISIT FR

## 2019-06-11 NOTE — Progress Notes (Signed)
Presenting hypertension and dm, says she is not having any medical concerns this morning. Refills are provided by the facility she is living in. Says she is not experiencing any anxiety and or depression

## 2019-06-11 NOTE — Progress Notes (Signed)
Virtual Visit Note  I connected with patient on 06/11/19 at 1124 by phone and verified that I am speaking with the correct person using two identifiers. Judith [name] is currently located at assisted living and patient is currently with them during visit. The provider, Judith Guys, MD is located in their office at time of visit.  I discussed the limitations, risks, security and privacy concerns of performing an evaluation and management service by telephone and the availability of in person appointments. I also discussed with the patient that there may be a patient responsible charge related to this service. The patient expressed understanding and agreed to proceed.   CC: routine followup  HPI ? Patient is a 78 y.o. female with past medical history significant for DM2 insulin dependent, HTN, gastroparesis, polyarthritis, gastric ulcerswho presents today for routine follow up  Last OV March 2020 Lives at [facility] assisted living, completed 14 day quarantine due to recent cases within facility, just had negative test Patient reports she has been feeling well Will be seeing Card in July Has not seen endo nor retina specialist Reports cbgs have been running high, endo did just change ISS This morning 199, did not have much supper last night Denies any lows cbgs  Reports BP has been doing well after last change in medication Rash is significantly improved - [name] to followup with derm  Denies any fever, chills, cough, SOB, chest pain, palpitations, edema, nausea, vomiting, abd pain, blood in stools or black tarry stools, constipation, reflux, increased urination or thirst, dysuria, insomnia Waking up with dry mouth  Lab Results  Component Value Date   HGBA1C 6.8 (A) 01/22/2019   HGBA1C 6.1 01/24/2018   HGBA1C 6.6 (H) 10/29/2017   Lab Results  Component Value Date   LDLCALC 65 01/22/2019   CREATININE 0.80 01/22/2019   BP Readings from Last 3 Encounters:  03/05/19 118/70   01/22/19 (!) 170/67  01/04/19 (!) 165/59   Wt Readings from Last 3 Encounters:  03/05/19 123 lb (55.8 kg)  01/22/19 122 lb 9.6 oz (55.6 kg)  01/04/19 120 lb (54.4 kg)    Allergies  Allergen Reactions  . Asa [Aspirin] Other (See Comments)    Causes ulcers   . Tylenol [Acetaminophen]     Intolerance due to liver damage  . Sulfa Antibiotics Rash    Prior to Admission medications   Medication Sig Start Date End Date Taking? Authorizing Provider  Biotin 1000 MCG tablet Take 1,000 mcg by mouth daily.    [provider]  blood glucose meter kit and supplies KIT Dispense based on patient and insurance preference. Check capillary glucose three times a day, before meals.  Dx E11.65, Z79.4 10/29/17   Judith Guys, MD  brimonidine [facility]) 0.2 % ophthalmic solution  12/24/18   [provider]  carvedilol (COREG) 25 MG tablet Take 1 tablet (25 mg total) by mouth 2 (two) times daily with a meal. 01/22/19   Judith Guys, MD  colchicine 0.6 MG tablet Take 0.6 mg by mouth daily.    [provider]  diclofenac sodium (VOLTAREN) 1 % GEL Apply 2 g topically 4 (four) times daily. 01/24/18   Judith Guys, MD  diphenhydrAMINE (BENADRYL) 25 mg capsule Take 1 capsule (25 mg total) by mouth daily as needed for itching. 05/16/18   Judith Guys, MD  DULoxetine (CYMBALTA) 60 MG capsule TAKE 1 CAPSULE (60 MG TOTAL) DAILY BY MOUTH. 04/28/18   Judith Guys, MD  gabapentin (  NEURONTIN) 100 MG capsule Take 1 capsule (100 mg) every morning and afternoon, and take 4 capsules (423m) at bedtime Patient taking differently: Take 100-400 mg by mouth 4 (four) times daily. 100 mg in the morning, 100 mg in the afternoon & 400 mg at bedtime 04/03/18   SRutherford Guys MD  glucose blood (ONE TOUCH ULTRA TEST) test strip USE TO CHECK GLUCOSE LEVELS 3 TIMES A DAY, DX E11.9, Z79.4 11/30/17   SRutherford Guys MD  Insulin Glargine (LANTUS SOLOSTAR) 100 UNIT/ML Solostar Pen Inject 26 Units  into the skin daily at 10 pm. Patient taking differently: Inject 22 Units into the skin daily.  01/24/18   SRutherford Guys MD  insulin lispro (HUMALOG) 100 UNIT/ML injection Inject 1-10 Units into the skin 3 (three) times daily before meals. Sliding scale    [provider]  Insulin Pen Needle (B-D ULTRAFINE III SHORT PEN) 31G X 8 MM MISC To use as directed with insulin, four times a day. Dx E11.9, Z79.4 11/08/17   SRutherford Guys MD  ketorolac (Nancie Neas 0.4 % SOLN  01/11/19   [provider]  ketorolac (ACULAR) 0.5 % ophthalmic solution  11/19/18   [provider]  losartan (COZAAR) 100 MG tablet Take 1 tablet (100 mg total) by mouth daily. 11/19/18   Croitoru, Mihai, MD  Multiple Vitamin (MULTIVITAMIN) tablet Take 1 tablet daily by mouth.    [provider]  mupirocin ointment (BACTROBAN) 2 %  01/06/19   [provider]  ofloxacin (OCUFLOX) 0.3 % ophthalmic solution  01/01/19   [provider]  pantoprazole (PROTONIX) 40 MG tablet TAKE 1 TABLET (40 MG TOTAL) DAILY BY MOUTH. 04/28/18   SRutherford Guys MD  prednisoLONE acetate (PRED FORTE) 1 % ophthalmic suspension  12/24/18   [provider]  ranitidine (ZANTAC) 150 MG capsule Take 1 capsule (150 mg total) at bedtime by mouth. 10/30/17   SRutherford Guys MD  triamcinolone cream (KENALOG) 0.5 % Apply 1 application topically 2 (two) times daily. 09/22/18   SRutherford Guys MD    Past Medical History:  Diagnosis Date  . Anemia   . Arthritis   . Biliary gastritis   . Blood transfusion without reported diagnosis    2007  . Depression   . Diabetes mellitus type 2 with complications ([place]   . Duodenal ulcer disease   . Encounter for long-term (current) use of insulin ([place]   . Gastric ulcer   . Gastroparesis    severe  . GERD (gastroesophageal reflux disease)   . Gout    tested for gout but was not  . Heart murmur   . Hyperlipidemia    in past not now  . Hypertension   .  Hypokalemia, gastrointestinal losses    secondary to severe gastroparesis  . Osteoporosis    just had bone density test possible   . Peripheral neuropathy   . Presence of cardiac pacemaker   . S/P partial gastrectomy    due to severe gastric and gudoenal ulcers  . Seasonal allergies   . Sleep apnea    could not use CPAP  . UTI (urinary tract infection)     Past Surgical History:  Procedure Laterality Date  . COLONOSCOPY  02/11/2018   no polyps, + small internal hemorrhoids  . EUS N/A 12/12/2017   Procedure: UPPER ENDOSCOPIC ULTRASOUND (EUS) RADIAL;  Surgeon: JMilus Banister MD;  Location: [facility];  Service: Endoscopy;  Laterality: N/A;  . GASTRECTOMY    .  JOINT REPLACEMENT    . pace maker    . PARTIAL GASTRECTOMY    . TONSILLECTOMY     45 years ago  . UPPER GASTROINTESTINAL ENDOSCOPY      Social History   Tobacco Use  . Smoking status: Never Smoker  . Smokeless tobacco: Never Used  Substance Use Topics  . Alcohol use: No    Alcohol/week: 0.0 standard drinks    Family History  Problem Relation Age of Onset  . Diabetes Mother   . Diabetes Father   . Heart disease Father   . Diabetes Sister   . Heart disease Son   . Colon cancer Neg Hx   . Colon polyps Neg Hx   . Rectal cancer Neg Hx   . Stomach cancer Neg Hx   . Esophageal cancer Neg Hx     ROS Per hpi  Objective  Vitals as reported by the patient: none   ASSESSMENT and PLAN  1. Essential hypertension Per last BP check controlled. Cont current regime.  2. Type 2 diabetes mellitus with hyperglycemia, with long-term current use of insulin (H[facility]) Last A1c at goal, however having recent increase in cbgs. Strongly encouraged her to make appt with endo, who manages her DM2 She is also due to followup with retinal specialist as she was undergoing treatment for retinopathy.  3. Presence of cardiac pacemaker Due for cards appt for pace maker check in July  4. Gastroparesis Asymptomatic.   Other  orders  FOLLOW-UP: 3 months   The above assessment and management plan was discussed with the patient. The patient verbalized understanding of and has agreed to the management plan. Patient is aware to call the clinic if symptoms persist or worsen. Patient is aware when to return to the clinic for a follow-up visit. Patient educated on when it is appropriate to go to the emergency department.    I provided 18 minutes of non-face-to-face time during this encounter.  Judith Guys, MD Primary Care at [place], [place] [address] Ph.  [phone] Fax [phone]

## 2019-06-18 DIAGNOSIS — Z6821 Body mass index (BMI) 21.0-21.9, adult: Secondary | ICD-10-CM | POA: Diagnosis not present

## 2019-06-18 DIAGNOSIS — E0865 Diabetes mellitus due to underlying condition with hyperglycemia: Secondary | ICD-10-CM | POA: Diagnosis not present

## 2019-06-18 DIAGNOSIS — R748 Abnormal levels of other serum enzymes: Secondary | ICD-10-CM | POA: Diagnosis not present

## 2019-06-18 DIAGNOSIS — K3184 Gastroparesis: Secondary | ICD-10-CM | POA: Diagnosis not present

## 2019-06-18 DIAGNOSIS — E0841 Diabetes mellitus due to underlying condition with diabetic mononeuropathy: Secondary | ICD-10-CM | POA: Diagnosis not present

## 2019-06-22 ENCOUNTER — Encounter (INDEPENDENT_AMBULATORY_CARE_PROVIDER_SITE_OTHER): Payer: Medicare Other | Admitting: Ophthalmology

## 2019-06-22 ENCOUNTER — Other Ambulatory Visit: Payer: Self-pay

## 2019-06-22 DIAGNOSIS — E113313 Type 2 diabetes mellitus with moderate nonproliferative diabetic retinopathy with macular edema, bilateral: Secondary | ICD-10-CM | POA: Diagnosis not present

## 2019-06-22 DIAGNOSIS — I10 Essential (primary) hypertension: Secondary | ICD-10-CM | POA: Diagnosis not present

## 2019-06-22 DIAGNOSIS — H35033 Hypertensive retinopathy, bilateral: Secondary | ICD-10-CM

## 2019-06-22 DIAGNOSIS — E11311 Type 2 diabetes mellitus with unspecified diabetic retinopathy with macular edema: Secondary | ICD-10-CM | POA: Diagnosis not present

## 2019-06-22 DIAGNOSIS — H43813 Vitreous degeneration, bilateral: Secondary | ICD-10-CM

## 2019-07-13 ENCOUNTER — Ambulatory Visit: Payer: Medicare Other | Admitting: Cardiovascular Disease

## 2019-08-24 ENCOUNTER — Other Ambulatory Visit: Payer: Self-pay

## 2019-08-24 ENCOUNTER — Ambulatory Visit (INDEPENDENT_AMBULATORY_CARE_PROVIDER_SITE_OTHER): Payer: Medicare Other | Admitting: Cardiovascular Disease

## 2019-08-24 ENCOUNTER — Encounter: Payer: Self-pay | Admitting: Cardiovascular Disease

## 2019-08-24 ENCOUNTER — Encounter: Payer: Medicare Other | Admitting: Cardiovascular Disease

## 2019-08-24 VITALS — BP 108/60 | HR 71 | Temp 97.7°F | Ht 63.0 in | Wt 120.0 lb

## 2019-08-24 DIAGNOSIS — E119 Type 2 diabetes mellitus without complications: Secondary | ICD-10-CM

## 2019-08-24 DIAGNOSIS — Z950 Presence of cardiac pacemaker: Secondary | ICD-10-CM

## 2019-08-24 DIAGNOSIS — I442 Atrioventricular block, complete: Secondary | ICD-10-CM | POA: Diagnosis not present

## 2019-08-24 DIAGNOSIS — Z79899 Other long term (current) drug therapy: Secondary | ICD-10-CM | POA: Diagnosis not present

## 2019-08-24 DIAGNOSIS — I10 Essential (primary) hypertension: Secondary | ICD-10-CM

## 2019-08-24 NOTE — Patient Instructions (Signed)
Medication Instructions:  The current medical regimen is effective;  continue present plan and medications as directed. Please refer to the Current Medication list given to you today. If you need a refill on your cardiac medications before your next appointment, please call your pharmacy.  Labwork: BMET AND A1C TODAY  HERE IN OUR OFFICE AT [facility]   Take the provided lab slips with you to the lab for your blood draw.   When you have your labs (blood work) drawn today and your tests are completely normal, you will receive your results only by MyChart Message (if you have MyChart) -OR-  A paper copy in the mail.  If you have any lab test that is abnormal or we need to change your treatment, we will call you to review these results.  Follow-Up: You will need a follow up appointment in 6 months.  Please call our office 2 months in advance, December 2020 to schedule this, February 2021 appointment.  You may see Sanda Klein, MD or one of the following Advanced Practice Providers on your designated Care Team: Almyra Deforest, [place]   Fabian Sharp, PA-C     At [facility], you and your health needs are our priority.  As part of our continuing mission to provide you with exceptional heart care, we have created designated Provider Care Teams.  These Care Teams include your primary Cardiologist (physician) and Advanced Practice Providers (APPs -  Physician Assistants and Nurse Practitioners) who all work together to provide you with the care you need, when you need it.  Thank you for choosing [facility]!!

## 2019-08-24 NOTE — Progress Notes (Signed)
Cardiology office note:    Date:  08/25/2019   ID:  Judith [name], DOB 21-Jul-1941, MRN [id number]  PCP:  Rutherford Guys, MD  Cardiologist:  Sanda Klein, MD    Referring MD: Rutherford Guys, MD   Chief complaint: pacemaker check  History of Present Illness:    Judith [name] is a 78 y.o. female with a hx of pacemaker implantation in [place].  She moved to [place] while recovering from a fall but now is a resident at [facility] with no plans of returning to [place].    She is done generally well from a cardiovascular point of view without any complaints.The patient specifically denies any chest pain at rest exertion, dyspnea at rest or with exertion, orthopnea, paroxysmal nocturnal dyspnea, syncope, palpitations, focal neurological deficits, intermittent claudication, lower extremity edema, unexplained weight gain, cough, hemoptysis or wheezing.  She is concerned about deteriorating glycemic control.  Typically her morning sugars have been in the 300-400 range.  She has not had a recent follow-up visit with her endocrinologist.  Blood pressure control has been good.  Her initial pacemaker was implanted in 2007.  It appears that her ventricular lead is still the original one (Guidant [id number] implanted July 05, 2006).  June 09, 2010 she underwent a pacemaker generator change out and placement of a new atrial lead (Guidant [id number]).  Her current generator is a Control and instrumentation engineer [id number].  Her device is not amenable to remote downloads.  She has complete heart block and is pacemaker dependent.  No escape rhythm was detected today.  Manual testing over ventricular pacing threshold was 0.6 V at 0.4 ms, identical to the automatic capture.  She has 7 % atrial pacing with good heart rate histogram distribution.  There is 100% ventricular pacing.  Estimated generator longevity is roughly 2.5 years.  All lead parameters are excellent.  She has not had atrial fibrillation or ventricular tachycardia.   At one previous device check we had detected a 6-second episode of possible atrial fibrillation.  She does not have a history of coronary disease.  She reports undergoing stress test in the past, last one as recently as 3 years ago.  Normal results.  She had an echocardiogram just a few months before coming to [place] and was told that her heart pumping strength is normal.  She was told that she has a murmur which is due to a mildly leaky valve.  There is no reported history of heart failure that I am aware of.  Her presenting rhythm today was atrial sensed, ventricular paced.  She does not have intermittent claudication and reports that lower extremity ABIs have been performed in the past with normal results.  She has never had a stroke or TIA.  She is to take lovastatin for lipid lowering but has been off this medication for a while.  She has lost about 30 pounds of weight.  Her lipid profile performed on November 6 shows an LDL cholesterol of 88, HDL 26, triglycerides 138, total cholesterol 142. Most recent hemoglobin A1c was 6.1%.  Past Medical History:  Diagnosis Date  . Anemia   . Arthritis   . Biliary gastritis   . Blood transfusion without reported diagnosis    2007  . Depression   . Diabetes mellitus type 2 with complications ([facility])   . Duodenal ulcer disease   . Encounter for long-term (current) use of insulin ([facility])   . Gastric ulcer   . Gastroparesis    severe  .  GERD (gastroesophageal reflux disease)   . Gout    tested for gout but was not  . Heart murmur   . Hyperlipidemia    in past not now  . Hypertension   . Hypokalemia, gastrointestinal losses    secondary to severe gastroparesis  . Osteoporosis    just had bone density test possible   . Peripheral neuropathy   . Presence of cardiac pacemaker   . S/P partial gastrectomy    due to severe gastric and gudoenal ulcers  . Seasonal allergies   . Sleep apnea    could not use CPAP  . UTI (urinary tract infection)      Past Surgical History:  Procedure Laterality Date  . COLONOSCOPY  02/11/2018   no polyps, + small internal hemorrhoids  . EUS N/A 12/12/2017   Procedure: UPPER ENDOSCOPIC ULTRASOUND (EUS) RADIAL;  Surgeon: [name], MD;  Location: [facility];  Service: Endoscopy;  Laterality: N/A;  . GASTRECTOMY    . JOINT REPLACEMENT    . pace maker    . PARTIAL GASTRECTOMY    . TONSILLECTOMY     45 years ago  . UPPER GASTROINTESTINAL ENDOSCOPY      Current Medications: No outpatient medications have been marked as taking for the 08/24/19 encounter (Office Visit) with Sanda Klein, MD.     Allergies:   Asa [aspirin], Tylenol [acetaminophen], and Sulfa antibiotics   Social History   Socioeconomic History  . Marital status: Widowed    Spouse name: Not on file  . Number of children: 3  . Years of education: Not on file  . Highest education level: High school graduate  Occupational History  . Not on file  Social Needs  . Financial resource strain: Not hard at all  . Food insecurity    Worry: Never true    Inability: Never true  . Transportation needs    Medical: No    Non-medical: No  Tobacco Use  . Smoking status: Never Smoker  . Smokeless tobacco: Never Used  Substance and Sexual Activity  . Alcohol use: No    Alcohol/week: 0.0 standard drinks  . Drug use: No  . Sexual activity: Not Currently  Lifestyle  . Physical activity    Days per week: 0 days    Minutes per session: 0 min  . Stress: Not at all  Relationships  . Social Herbalist on phone: Once a week    Gets together: Once a week    Attends religious service: More than 4 times per year    Active member of club or organization: No    Attends meetings of clubs or organizations: Never    Relationship status: Widowed  Other Topics Concern  . Not on file  Social History Narrative   Used to live independently in [place]   However had significant issues with severe gastroparesis resulting in  severe hypokalemia and general weakness and fall resulting in hospitalization and SNF rehab   Now (10/2017) living with her daughter in [place] until able to stabilize and regain independence.      May 2019   Patient living in assisted living, [facility]     Family History: The patient's family history significant for the absence of premature cardiac illness  ROS:   Please see the history of present illness.    All other systems are reviewed and are negative  EKGs/Labs/Other Studies Reviewed:    The following studies were reviewed today: Comprehensive pacemaker interrogation  EKG:  EKG is ordered today.  Atrial sensed, ventricular paced rhythm (sinus mechanism), QTC 517 ms Recent Labs: 01/22/2019: ALT 9; Hemoglobin 12.8; Platelets 304; TSH 1.600 08/24/2019: BUN 18; Creatinine, Ser 1.09; Potassium 4.4; Sodium 134  Recent Lipid Panel    Component Value Date/Time   CHOL 125 01/22/2019 1119   TRIG 72 01/22/2019 1119   HDL 46 01/22/2019 1119   CHOLHDL 2.7 01/22/2019 1119   LDLCALC 65 01/22/2019 1119    Physical Exam:    VS:  BP 108/60   Pulse 71   Temp 97.7 F (36.5 C)   Ht 5\' 3"  (1.6 m)   Wt 120 lb (54.4 kg)   BMI 21.26 kg/m     Wt Readings from Last 3 Encounters:  08/24/19 120 lb (54.4 kg)  03/05/19 123 lb (55.8 kg)  01/22/19 122 lb 9.6 oz (55.6 kg)      General: Alert, oriented x3, no distress, slender, but no longer underweight.  Well-healed subclavian pacemaker site Head: no evidence of trauma, PERRL, EOMI, no exophtalmos or lid lag, no myxedema, no xanthelasma; normal ears, nose and oropharynx Neck: normal jugular venous pulsations and no hepatojugular reflux; brisk carotid pulses without delay and no carotid bruits Chest: clear to auscultation, no signs of consolidation by percussion or palpation, normal fremitus, symmetrical and full respiratory excursions Cardiovascular: normal position and quality of the apical impulse, regular rhythm, normal first and second heart  sounds, no murmurs, rubs or gallops Abdomen: no tenderness or distention, no masses by palpation, no abnormal pulsatility or arterial bruits, normal bowel sounds, no hepatosplenomegaly Extremities: no clubbing, cyanosis or edema; 2+ radial, ulnar and brachial pulses bilaterally; 2+ right femoral, posterior tibial and dorsalis pedis pulses; 2+ left femoral, posterior tibial and dorsalis pedis pulses; no subclavian or femoral bruits Neurological: grossly nonfocal Psych: Normal mood and affect    ASSESSMENT:    1. Heart block AV complete ([facility])   2. Pacemaker   3. Diabetes mellitus type 2 in nonobese ([facility])   4. Essential hypertension   5. Medication management    PLAN:    In order of problems listed above:  1. CHB: Pacemaker dependent..   2. PPM: Ideally like her to come in every 3 months since she is pacemaker dependent, but with the current coronavirus restrictions I think it is safer for her to be seen twice a year, at least until the generator reports 1 year or less of estimated longevity. 3. DM: With multiple complications including gastroparesis and neuropathy.  She reports deterioration in her morning fingerstick blood glucose levels and the hemoglobin A1c that we checked today confirms worsened glycemic control.  We will send the results to her PCP and endocrinologist.  Excellent lipid parameters with LDL 65 4. HTN: Blood pressure is well controlled after we increased her losartan.  She is also on high-dose carvedilol.  Avoid diuretics due to history of pseudogout and risk of dehydration in the setting of gastroparesis.    Medication Adjustments/Labs and Tests Ordered: Current medicines are reviewed at length with the patient today.  Concerns regarding medicines are outlined above.  Orders Placed This Encounter  Procedures  . Basic metabolic panel  . HgB A1c  . EKG 12-Lead   No orders of the defined types were placed in this encounter.   Signed, Sanda Klein, MD  08/25/2019  5:01 PM    [facility]

## 2019-08-25 ENCOUNTER — Encounter: Payer: Self-pay | Admitting: Cardiovascular Disease

## 2019-08-25 LAB — BASIC METABOLIC PANEL
BUN/Creatinine Ratio: 17 (ref 12–28)
BUN: 18 mg/dL (ref 8–27)
CO2: 21 mmol/L (ref 20–29)
Calcium: 9.1 mg/dL (ref 8.7–10.3)
Chloride: 99 mmol/L (ref 96–106)
Creatinine, Ser: 1.09 mg/dL — ABNORMAL HIGH (ref 0.57–1.00)
GFR calc Af Amer: 56 mL/min/{1.73_m2} — ABNORMAL LOW (ref >59)
GFR calc non Af Amer: 48 mL/min/{1.73_m2} — ABNORMAL LOW (ref >59)
Glucose: 283 mg/dL — ABNORMAL HIGH (ref 65–99)
Potassium: 4.4 mmol/L (ref 3.5–5.2)
Sodium: 134 mmol/L (ref 134–144)

## 2019-08-25 LAB — HEMOGLOBIN A1C
Est. average glucose Bld gHb Est-mCnc: 180 mg/dL
Hgb A1c MFr Bld: 7.9 % — ABNORMAL HIGH (ref 4.8–5.6)

## 2019-09-11 ENCOUNTER — Other Ambulatory Visit: Payer: Self-pay

## 2019-09-16 ENCOUNTER — Other Ambulatory Visit: Payer: Self-pay | Admitting: Family Medicine

## 2019-09-21 ENCOUNTER — Ambulatory Visit (INDEPENDENT_AMBULATORY_CARE_PROVIDER_SITE_OTHER): Payer: Medicare Other | Admitting: Family Medicine

## 2019-09-21 VITALS — BP 108/60 | Ht 63.0 in | Wt 120.0 lb

## 2019-09-21 DIAGNOSIS — Z0000 Encounter for general adult medical examination without abnormal findings: Secondary | ICD-10-CM

## 2019-09-21 NOTE — Patient Instructions (Signed)
Thank you for taking time to come for your Medicare Wellness Visit. I appreciate your ongoing commitment to your health goals. Please review the following plan we discussed and let me know if I can assist you in the future.  Julie Greer LPN  Preventive Care 79 Years and Older, Female Preventive care refers to lifestyle choices and visits with your health care provider that can promote health and wellness. This includes:  A yearly physical exam. This is also called an annual well check.  Regular dental and eye exams.  Immunizations.  Screening for certain conditions.  Healthy lifestyle choices, such as diet and exercise. What can I expect for my preventive care visit? Physical exam Your health care provider will check:  Height and weight. These may be used to calculate body mass index (BMI), which is a measurement that tells if you are at a healthy weight.  Heart rate and blood pressure.  Your skin for abnormal spots. Counseling Your health care provider may ask you questions about:  Alcohol, tobacco, and drug use.  Emotional well-being.  Home and relationship well-being.  Sexual activity.  Eating habits.  History of falls.  Memory and ability to understand (cognition).  Work and work environment.  Pregnancy and menstrual history. What immunizations do I need?  Influenza (flu) vaccine  This is recommended every year. Tetanus, diphtheria, and pertussis (Tdap) vaccine  You may need a Td booster every 10 years. Varicella (chickenpox) vaccine  You may need this vaccine if you have not already been vaccinated. Zoster (shingles) vaccine  You may need this after age 60. Pneumococcal conjugate (PCV13) vaccine  One dose is recommended after age 65. Pneumococcal polysaccharide (PPSV23) vaccine  One dose is recommended after age 65. Measles, mumps, and rubella (MMR) vaccine  You may need at least one dose of MMR if you were born in 1957 or later. You may also  need a second dose. Meningococcal conjugate (MenACWY) vaccine  You may need this if you have certain conditions. Hepatitis A vaccine  You may need this if you have certain conditions or if you travel or work in places where you may be exposed to hepatitis A. Hepatitis B vaccine  You may need this if you have certain conditions or if you travel or work in places where you may be exposed to hepatitis B. Haemophilus influenzae type b (Hib) vaccine  You may need this if you have certain conditions. You may receive vaccines as individual doses or as more than one vaccine together in one shot (combination vaccines). Talk with your health care provider about the risks and benefits of combination vaccines. What tests do I need? Blood tests  Lipid and cholesterol levels. These may be checked every 5 years, or more frequently depending on your overall health.  Hepatitis C test.  Hepatitis B test. Screening  Lung cancer screening. You may have this screening every year starting at age 55 if you have a 30-pack-year history of smoking and currently smoke or have quit within the past 15 years.  Colorectal cancer screening. All adults should have this screening starting at age 50 and continuing until age 75. Your health care provider may recommend screening at age 45 if you are at increased risk. You will have tests every 1-10 years, depending on your results and the type of screening test.  Diabetes screening. This is done by checking your blood sugar (glucose) after you have not eaten for a while (fasting). You may have this done every 1-3   years.  Mammogram. This may be done every 1-2 years. Talk with your health care provider about how often you should have regular mammograms.  BRCA-related cancer screening. This may be done if you have a family history of breast, ovarian, tubal, or peritoneal cancers. Other tests  Sexually transmitted disease (STD) testing.  Bone density scan. This is done  to screen for osteoporosis. You may have this done starting at age 6. Follow these instructions at home: Eating and drinking  Eat a diet that includes fresh fruits and vegetables, whole grains, lean protein, and low-fat dairy products. Limit your intake of foods with high amounts of sugar, saturated fats, and salt.  Take vitamin and mineral supplements as recommended by your health care provider.  Do not drink alcohol if your health care provider tells you not to drink.  If you drink alcohol: ? Limit how much you have to 0-1 drink a day. ? Be aware of how much alcohol is in your drink. In the [place] one drink equals one 12 oz bottle of beer (355 mL), one 5 oz glass of wine (148 mL), or one 1 oz glass of hard liquor (44 mL). Lifestyle  Take daily care of your teeth and gums.  Stay active. Exercise for at least 30 minutes on 5 or more days each week.  Do not use any products that contain nicotine or tobacco, such as cigarettes, e-cigarettes, and chewing tobacco. If you need help quitting, ask your health care provider.  If you are sexually active, practice safe sex. Use a condom or other form of protection in order to prevent STIs (sexually transmitted infections).  Talk with your health care provider about taking a low-dose aspirin or statin. What's next?  Go to your health care provider once a year for a well check visit.  Ask your health care provider how often you should have your eyes and teeth checked.  Stay up to date on all vaccines. This information is not intended to replace advice given to you by your health care provider. Make sure you discuss any questions you have with your health care provider. Document Released: 01/06/2016 Document Revised: 12/04/2018 Document Reviewed: 12/04/2018 Elsevier Patient Education  2020 [employer].

## 2019-09-21 NOTE — Progress Notes (Signed)
Presents today for [employer] Visit   Date of last exam:  06/11/2019  Interpreter used for this visit? No  I connected with  [name] on 09/21/19 by a telephone enabled  and verified that I am speaking with the correct person using two identifiers.   I discussed the limitations of evaluation and management by telemedicine. The patient expressed understanding and agreed to proceed.    Patient Care Team: Rutherford Guys, MD as PCP - General (Family Medicine) Sanda Klein, MD as PCP - Cardiology (Cardiology) Eldridge Abrahams, MD as Referring Physician (Surgical Oncology) Pyrtle, Lajuan Lines, MD as Consulting Physician (Gastroenterology) Madelin Rear, MD as Referring Physician (Endocrinology) Sanda Klein, MD as Consulting Physician (Cardiology)   Other items to address today:   Discussed immunizations Discussed Eye/Dental Patient lives in an assisted living    Other Screening: Last screening for diabetes: 08/24/2019 Last lipid screening:   ADVANCE DIRECTIVES: Discussed: yes On File: yes Materials Provided: no  Immunization status:  Immunization History  Administered Date(s) Administered  . Influenza, High Dose Seasonal PF 09/22/2018  . PPD Test 04/03/2018  . Pneumococcal Polysaccharide-23 01/22/2019     Health Maintenance Due  Topic Date Due  . DEXA SCAN  03/07/2005  . INFLUENZA VACCINE  07/25/2019     Functional Status Survey: Is the patient deaf or have difficulty hearing?: No Does the patient have difficulty seeing, even when wearing glasses/contacts?: No Does the patient have difficulty concentrating, remembering, or making decisions?: No Does the patient have difficulty walking or climbing stairs?: No Does the patient have difficulty dressing or bathing?: No Does the patient have difficulty doing errands alone such as visiting a doctor's office or shopping?: Yes(lives in assisted living)   6CIT Screen 09/21/2019 01/24/2018   What Year? 0 points 0 points  What month? 0 points 0 points  What time? 0 points 3 points  Count back from 20 0 points 0 points  Months in reverse 0 points 0 points  Repeat phrase 0 points 4 points  Total Score 0 7        Clinical Support from 09/21/2019 in [place] at [facility]  AUDIT-C Score  0       Home Environment:   Live in an assisted living No scattered rugs Yes grab bars in bathroom Adequate lighting/no clutter  No trouble climbing stairs.   Patient Active Problem List   Diagnosis Date Noted  . Multilevel degenerative disc disease 04/03/2018  . Acute blood loss anemia 01/17/2018  . Gallstones 01/17/2018  . Weight loss, unintentional 01/17/2018  . Dilated bile duct   . Elevated liver function tests   . Gastritis and gastroduodenitis   . S/P partial gastrectomy   . Peripheral neuropathy   . Hypertension   . Gastroparesis   . Duodenal ulcer disease   . Controlled type 2 diabetes mellitus with diabetic neuropathy, with long-term current use of insulin ([place])   . Depression   . Biliary gastritis   . Seasonal allergies   . Presence of cardiac pacemaker   . Gout   . Cervical spine arthritis 04/05/2016  . Long-term insulin use in type 2 diabetes ([place]) 04/05/2016     Past Medical History:  Diagnosis Date  . Anemia   . Arthritis   . Biliary gastritis   . Blood transfusion without reported diagnosis    2007  . Depression   . Diabetes mellitus type 2 with complications ([place])   . Duodenal ulcer disease   .  Encounter for long-term (current) use of insulin ([place])   . Gastric ulcer   . Gastroparesis    severe  . GERD (gastroesophageal reflux disease)   . Gout    tested for gout but was not  . Heart murmur   . Hyperlipidemia    in past not now  . Hypertension   . Hypokalemia, gastrointestinal losses    secondary to severe gastroparesis  . Osteoporosis    just had bone density test possible   . Peripheral neuropathy   . Presence of cardiac pacemaker    . S/P partial gastrectomy    due to severe gastric and gudoenal ulcers  . Seasonal allergies   . Sleep apnea    could not use CPAP  . UTI (urinary tract infection)      Past Surgical History:  Procedure Laterality Date  . COLONOSCOPY  02/11/2018   no polyps, + small internal hemorrhoids  . EUS N/A 12/12/2017   Procedure: UPPER ENDOSCOPIC ULTRASOUND (EUS) RADIAL;  Surgeon: [name], MD;  Location: [facility];  Service: Endoscopy;  Laterality: N/A;  . GASTRECTOMY    . JOINT REPLACEMENT    . pace maker    . PARTIAL GASTRECTOMY    . TONSILLECTOMY     45 years ago  . UPPER GASTROINTESTINAL ENDOSCOPY       Family History  Problem Relation Age of Onset  . Diabetes Mother   . Diabetes Father   . Heart disease Father   . Diabetes Sister   . Heart disease Son   . Colon cancer Neg Hx   . Colon polyps Neg Hx   . Rectal cancer Neg Hx   . Stomach cancer Neg Hx   . Esophageal cancer Neg Hx      Social History   Socioeconomic History  . Marital status: Widowed    Spouse name: Not on file  . Number of children: 3  . Years of education: Not on file  . Highest education level: High school graduate  Occupational History  . Not on file  Social Needs  . Financial resource strain: Not hard at all  . Food insecurity    Worry: Never true    Inability: Never true  . Transportation needs    Medical: No    Non-medical: No  Tobacco Use  . Smoking status: Never Smoker  . Smokeless tobacco: Never Used  Substance and Sexual Activity  . Alcohol use: No    Alcohol/week: 0.0 standard drinks  . Drug use: No  . Sexual activity: Not Currently  Lifestyle  . Physical activity    Days per week: 0 days    Minutes per session: 0 min  . Stress: Not at all  Relationships  . Social Herbalist on phone: Once a week    Gets together: Once a week    Attends religious service: More than 4 times per year    Active member of club or organization: No    Attends meetings  of clubs or organizations: Never    Relationship status: Widowed  . Intimate partner violence    Fear of current or ex partner: No    Emotionally abused: No    Physically abused: No    Forced sexual activity: No  Other Topics Concern  . Not on file  Social History Narrative   Used to live independently in [place]   However had significant issues with severe gastroparesis resulting in severe hypokalemia and general  weakness and fall resulting in hospitalization and SNF rehab   Now (10/2017) living with her daughter in [place] until able to stabilize and regain independence.      May 2019   Patient living in assisted living, [place]     Allergies  Allergen Reactions  . Asa [Aspirin] Other (See Comments)    Causes ulcers   . Tylenol [Acetaminophen]     Intolerance due to liver damage  . Sulfa Antibiotics Rash     Prior to Admission medications   Medication Sig Start Date End Date Taking? Authorizing Provider  Biotin 1000 MCG tablet Take 1,000 mcg by mouth daily.   Yes [provider]  blood glucose meter kit and supplies KIT Dispense based on patient and insurance preference. Check capillary glucose three times a day, before meals.  Dx E11.65, Z79.4 10/29/17  Yes Rutherford Guys, MD  carvedilol (COREG) 25 MG tablet Take 1 tablet (25 mg total) by mouth 2 (two) times daily with a meal. 01/22/19  Yes Rutherford Guys, MD  colchicine 0.6 MG tablet Take 0.6 mg by mouth daily.   Yes [provider]  diclofenac sodium (VOLTAREN) 1 % GEL Apply 2 g topically 4 (four) times daily. 01/24/18  Yes Rutherford Guys, MD  diphenhydrAMINE (BENADRYL) 25 mg capsule Take 1 capsule (25 mg total) by mouth daily as needed for itching. 05/16/18  Yes Rutherford Guys, MD  DULoxetine (CYMBALTA) 60 MG capsule TAKE 1 CAPSULE (60 MG TOTAL) DAILY BY MOUTH. 04/28/18  Yes Rutherford Guys, MD  famotidine (PEPCID) 20 MG tablet Take 1 tablet (20 mg total) by mouth at bedtime. 06/11/19  Yes Rutherford Guys, MD  gabapentin (NEURONTIN) 100 MG capsule Take 1 capsule (100 mg) every morning and afternoon, and take 4 capsules (453m) at bedtime Patient taking differently: Take 100-400 mg by mouth 4 (four) times daily. 100 mg in the morning, 100 mg in the afternoon & 400 mg at bedtime 04/03/18  Yes SRutherford Guys MD  glucose blood (ONE TOUCH ULTRA TEST) test strip USE TO CHECK GLUCOSE LEVELS 3 TIMES A DAY, DX E11.9, Z79.4 11/30/17  Yes SRutherford Guys MD  HUMALOG KWIKPEN 100 UNIT/ML KwikPen CHECK FSBS BEFORE [facility] AND INJECT PER SSI(0-80.0UNITS)(80-149,5U)(150-180,9U)(181-500,CALL MD) 09/16/19  Yes SRutherford Guys MD  Insulin Glargine (LANTUS SOLOSTAR) 100 UNIT/ML Solostar Pen Inject 26 Units into the skin daily at 10 pm. Patient taking differently: Inject 22 Units into the skin daily.  01/24/18  Yes SRutherford Guys MD  insulin lispro (HUMALOG) 100 UNIT/ML injection Inject 1-10 Units into the skin 3 (three) times daily before meals. Sliding scale   Yes [provider]  Insulin Pen Needle (B-D ULTRAFINE III SHORT PEN) 31G X 8 MM MISC To use as directed with insulin, four times a day. Dx E11.9, Z79.4 11/08/17  Yes SRutherford Guys MD  ketorolac (Nancie Neas 0.5 % ophthalmic solution  11/19/18  Yes [provider]  losartan (COZAAR) 100 MG tablet Take 1 tablet (100 mg total) by mouth daily. 11/19/18  Yes Croitoru, Mihai, MD  Multiple Vitamin (MULTIVITAMIN) tablet Take 1 tablet daily by mouth.   Yes [provider]  pantoprazole (PROTONIX) 40 MG tablet TAKE 1 TABLET (40 MG TOTAL) DAILY BY MOUTH. 04/28/18  Yes SRutherford Guys MD  brimonidine ([facility] 0.2 % ophthalmic solution  12/24/18   [provider]  ketorolac (ACULAR) 0.4 % SOLN  01/11/19   [provider]  mupirocin ointment (Drue Stager  2 %  01/06/19   [provider]  ofloxacin (OCUFLOX) 0.3 % ophthalmic solution  01/01/19   [provider]  prednisoLONE acetate (PRED FORTE) 1 % ophthalmic  suspension  12/24/18   [provider]  triamcinolone cream (KENALOG) 0.5 % Apply 1 application topically 2 (two) times daily. Patient not taking: Reported on 09/21/2019 09/22/18   Rutherford Guys, MD     Depression screen [facility] 2/9 09/21/2019 03/05/2019 01/22/2019 10/09/2018 09/22/2018  Decreased Interest 0 0 0 0 0  Down, Depressed, Hopeless 0 0 0 0 0  PHQ - 2 Score 0 0 0 0 0  Altered sleeping - - - - -  Tired, decreased energy - - - - -  Change in appetite - - - - -  Feeling bad or failure about yourself  - - - - -  Trouble concentrating - - - - -  Moving slowly or fidgety/restless - - - - -  Suicidal thoughts - - - - -  PHQ-9 Score - - - - -  Difficult doing work/chores - - - - -     Fall Risk  09/21/2019 03/05/2019 01/22/2019 10/09/2018 09/22/2018  Falls in the past year? 0 0 0 Yes Yes  Number falls in past yr: 0 0 - 1 1  Injury with Fall? 0 0 - No No  Comment - - - - -  Risk Factor Category  - - - - -  Risk for fall due to : - - - - -  Follow up Falls evaluation completed;Education provided;Falls prevention discussed - - - -      PHYSICAL EXAM: BP 108/60 Comment: previous visit taken  Ht '5\' 3"'  (1.6 m)   Wt 120 lb (54.4 kg)   BMI 21.26 kg/m    Wt Readings from Last 3 Encounters:  09/21/19 120 lb (54.4 kg)  08/24/19 120 lb (54.4 kg)  03/05/19 123 lb (55.8 kg)   Medicare annual wellness visit, subsequent      Physical Exam   Education/Counseling provided regarding diet and exercise, prevention of chronic diseases, smoking/tobacco cessation, if applicable, and reviewed "Covered Medicare Preventive Services."

## 2019-10-01 DIAGNOSIS — Z6821 Body mass index (BMI) 21.0-21.9, adult: Secondary | ICD-10-CM | POA: Diagnosis not present

## 2019-10-01 DIAGNOSIS — R748 Abnormal levels of other serum enzymes: Secondary | ICD-10-CM | POA: Diagnosis not present

## 2019-10-01 DIAGNOSIS — E0841 Diabetes mellitus due to underlying condition with diabetic mononeuropathy: Secondary | ICD-10-CM | POA: Diagnosis not present

## 2019-10-01 DIAGNOSIS — E0865 Diabetes mellitus due to underlying condition with hyperglycemia: Secondary | ICD-10-CM | POA: Diagnosis not present

## 2019-10-01 DIAGNOSIS — K3184 Gastroparesis: Secondary | ICD-10-CM | POA: Diagnosis not present

## 2019-10-01 DIAGNOSIS — Z23 Encounter for immunization: Secondary | ICD-10-CM | POA: Diagnosis not present

## 2019-10-01 IMAGING — CT CT L SPINE W/O CM
3 of 7 series · 7 of 33 positions shown, 8 images · non-contrast
Comparison: Prior radiographs from earlier the same day.

CLINICAL DATA: Initial evaluation for midthoracic and back pain.
Progressive weakness.

EXAM:
CT THORACIC AND LUMBAR SPINE WITHOUT CONTRAST
TECHNIQUE: Multidetector CT imaging of the thoracic and lumbar spine was
performed without contrast. Multiplanar CT image reconstructions
were also generated.

[Series 5: t-spine 2.0 st · axial · 0.25mm/px · z∈[+1056,+1056]mm · 1 of 154 slices shown, 2 images]
[im 77/154  soft-tissue]
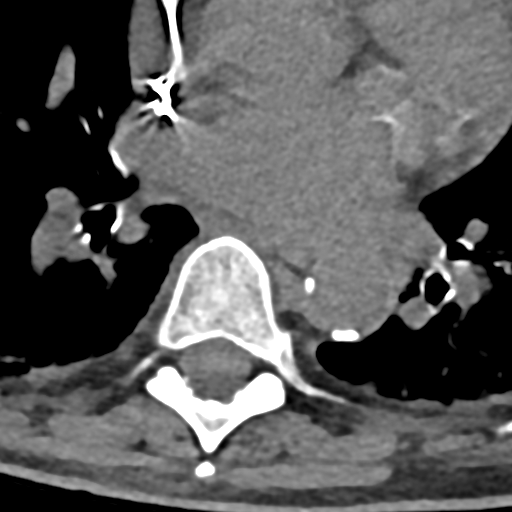
[im 77/154  bone]
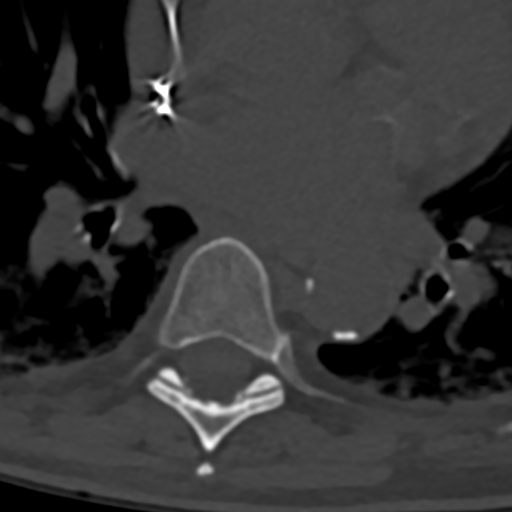

[Series 9: t-spine 2.0 cor bone · coronal · 0.23mm/px · 1 of 57 slices shown]
[im 29/57  bone]
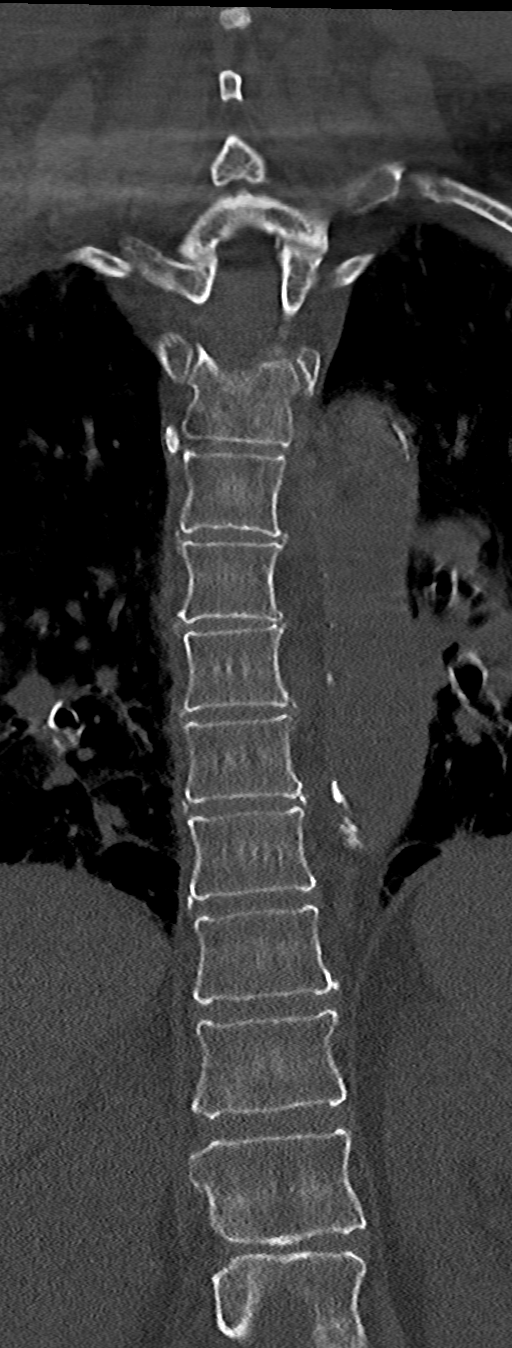

[Series 10: t-spine 2.0 sag bone · sagittal · 0.23mm/px · 5 of 61 slices shown]
[im 11/61  bone]
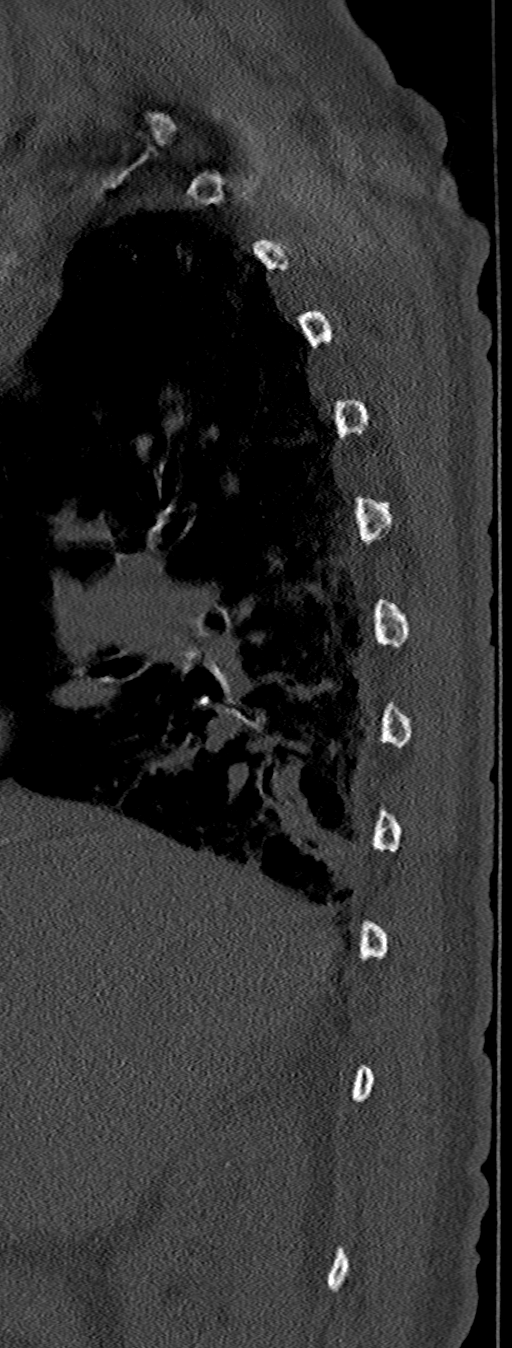
[im 21/61  bone]
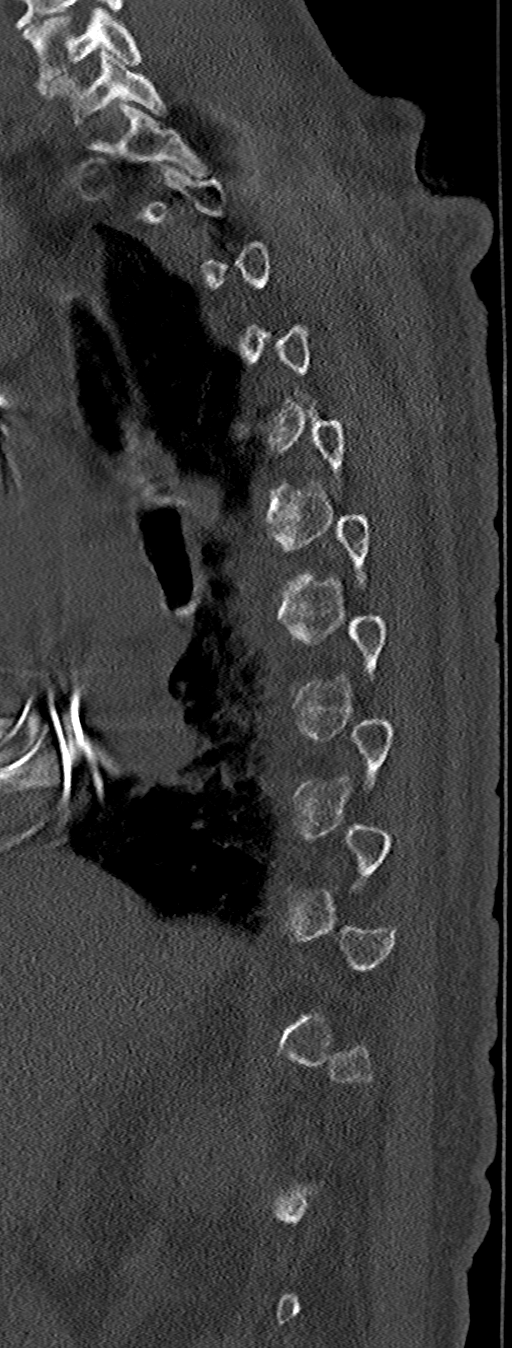
[im 31/61  bone]
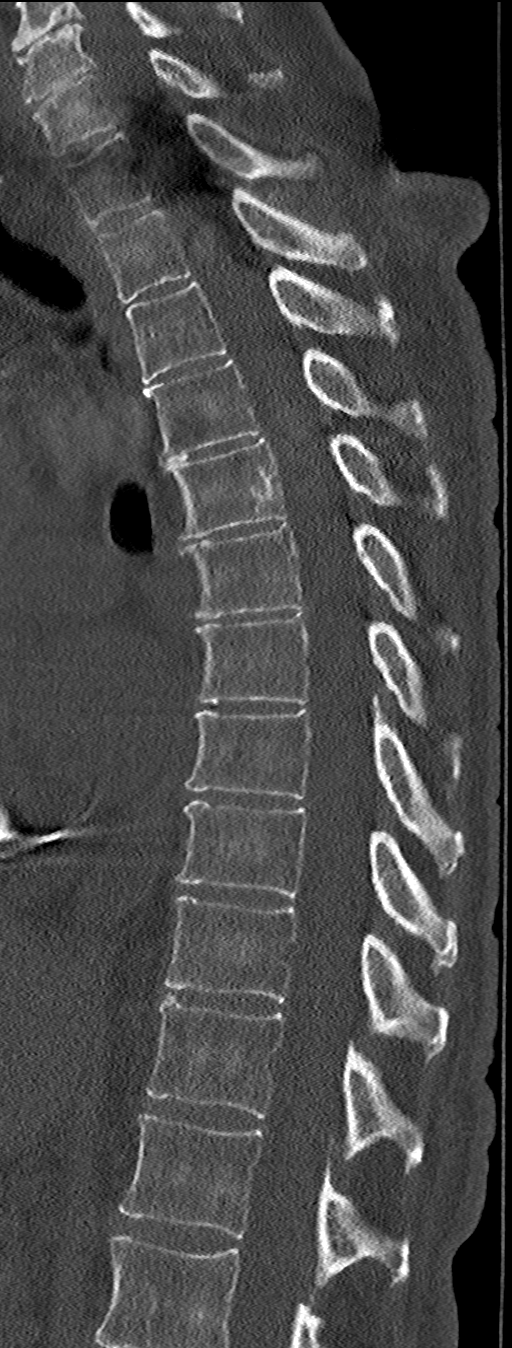
[im 41/61  bone]
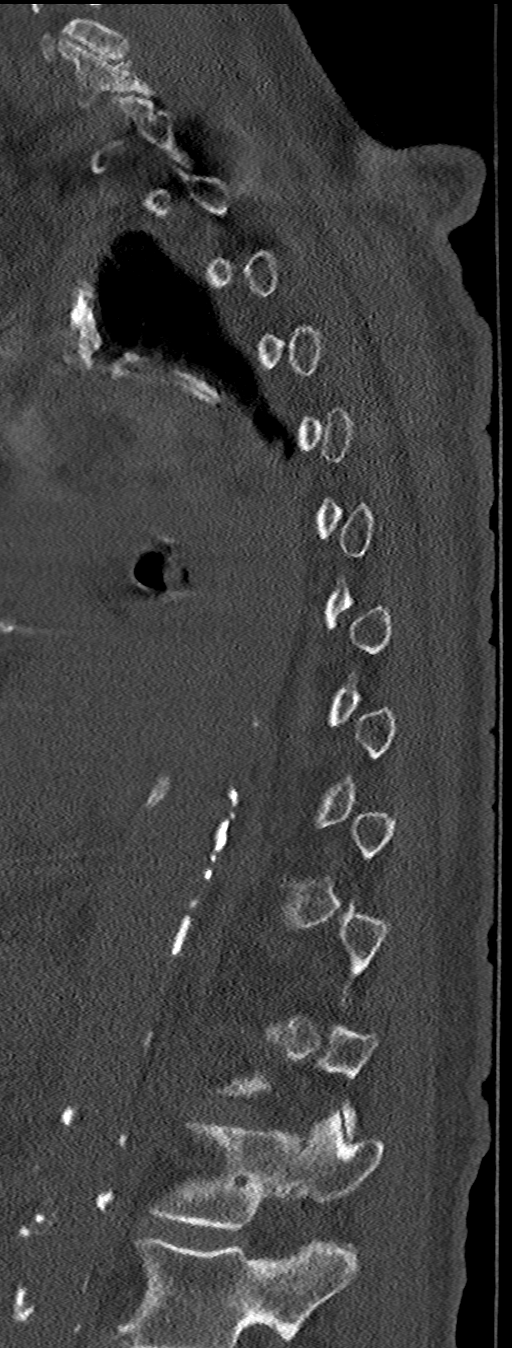
[im 51/61  bone]
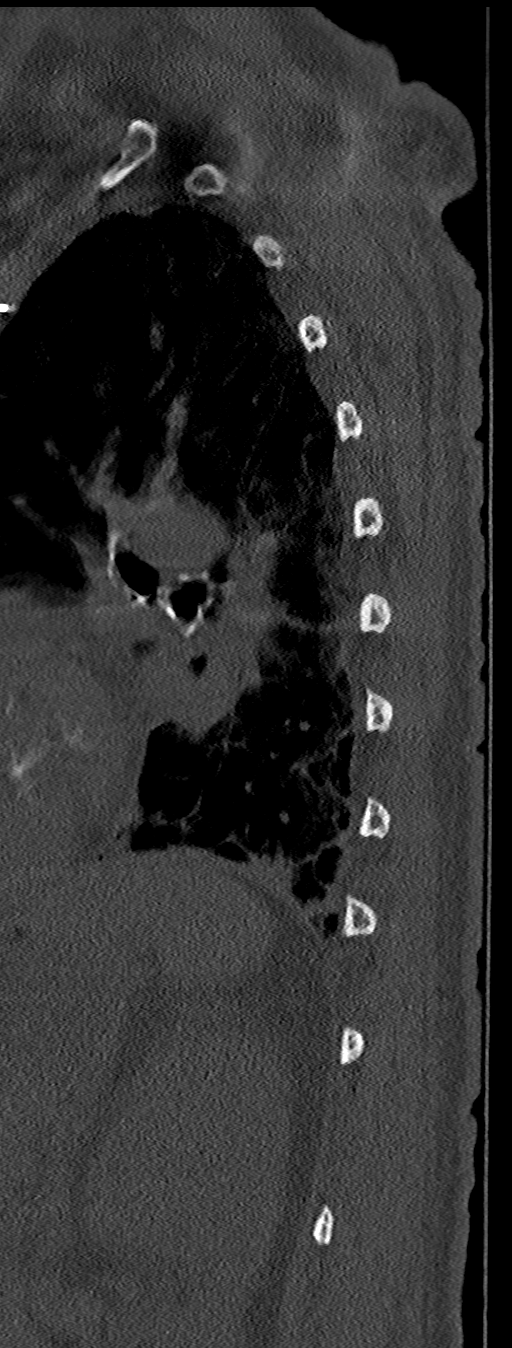

[7 of 33 positions shown; findings below may reference images not displayed]

FINDINGS: CT THORACIC SPINE FINDINGS

Alignment: Mild dextroscoliosis. Vertebral bodies otherwise normally
aligned with preservation of the normal thoracic kyphosis. No
listhesis.

Vertebrae: Vertebral body heights are maintained without evidence
for acute or chronic fracture. No discrete lytic or blastic osseous
lesions.

Paraspinal and other soft tissues: Paraspinous soft tissues
demonstrate no acute abnormality. Trace layering bilateral pleural
effusions with associated atelectasis. Cardiomegaly with pacemaker
electrodes partially visualized. Aortic atherosclerosis.

Disc levels: No significant degenerative disc disease seen within
the thoracic spine. No appreciable canal stenosis or significant
foraminal narrowing. Prominent osteoarthritic changes noted at the
left T10 costovertebral articulation.

CT LUMBAR SPINE FINDINGS

Segmentation: Normal segmentation. Lowest well-formed disc labeled
the L5-S1 level.

Alignment: Levoscoliosis with apex at L2-3. Chronic 3 mm
retrolisthesis of L5 on S1. Trace retrolisthesis of L3 on L4.
Vertebral bodies otherwise normally aligned.

Vertebrae: Vertebral body heights maintained without evidence for
acute or chronic fracture. No discrete lytic or blastic osseous
lesions. Chronic reactive endplate changes present about the L2-3
and L5-S1 interspaces.

Paraspinal and other soft tissues: Paraspinous soft tissues
demonstrate no acute abnormality. Prominent aortic atherosclerosis.
There is question of a focal right renal mass, of similar
attenuation relative to the Pi renal parenchyma. Partially
visualized left kidney within normal limits. Remainder of the
visualized visceral structures grossly unremarkable.

Disc levels:

L1-2: Right eccentric disc bulge with intervertebral disc space
narrowing. Mild facet hypertrophy. No significant canal or foraminal
stenosis.

L2-3: Chronic intervertebral disc space narrowing with diffuse disc
bulge and mild disc desiccation. Chronic reactive endplate changes
with marginal endplate osteophytic spurring on the right. Moderate
right with mild left facet hypertrophy. Resultant mild to moderate
right lateral recess narrowing without significant canal stenosis.
Mild right foraminal narrowing.

L3-4: Diffuse disc bulge with disc desiccation. Moderate facet
hypertrophy. Resultant mild to moderate canal and lateral recess
stenosis, greater on the right. Mild to moderate right L3 foraminal
narrowing.

L4-5: Diffuse disc bulge with disc desiccation. Disc bulging
eccentric to the right without focal disc protrusion. Moderate facet
and ligament flavum hypertrophy. Resultant mild canal with moderate
bilateral lateral recess stenosis. Mild to moderate right with mild
left L4 foraminal narrowing.

L5-S1: Retrolisthesis. Diffuse disc bulge with disc desiccation.
Moderate facet ligament flavum hypertrophy. Resultant moderate to
severe bilateral subarticular stenosis, greater on the left. Severe
left with mild to moderate right L5 foraminal narrowing.
IMPRESSION: CT THORACIC SPINE IMPRESSION

1. No acute abnormality within the thoracic spine.
2. Mild dextroscoliosis. No significant degenerative disease or
stenosis within the thoracic spine.
3. Small layering bilateral pleural effusions with associated
atelectasis.

CT LUMBAR SPINE IMPRESSION

1. No acute abnormality within the lumbar spine.
2. Chronic degenerative spondylolysis at L5-S1 with resultant severe
bilateral subarticular stenosis with severe left L5 foraminal
narrowing.
3. Multifactorial degenerative changes with resultant mild to
moderate canal and lateral recess narrowing at L3-4 and L4-5.
4. Question focal lesion/mass within the interpolar right kidney,
not entirely certain on this noncontrast examination. Further
assessment with cross-sectional imaging of the abdomen and pelvis,
either CT or MRI, recommended for complete characterization.

## 2019-11-10 DIAGNOSIS — L080 Pyoderma: Secondary | ICD-10-CM | POA: Diagnosis not present

## 2019-11-10 DIAGNOSIS — L089 Local infection of the skin and subcutaneous tissue, unspecified: Secondary | ICD-10-CM | POA: Diagnosis not present

## 2019-11-10 DIAGNOSIS — T8140XA Infection following a procedure, unspecified, initial encounter: Secondary | ICD-10-CM | POA: Diagnosis not present

## 2019-11-10 DIAGNOSIS — I080 Rheumatic disorders of both mitral and aortic valves: Secondary | ICD-10-CM | POA: Diagnosis not present

## 2019-12-01 ENCOUNTER — Other Ambulatory Visit: Payer: Self-pay

## 2019-12-01 ENCOUNTER — Ambulatory Visit (INDEPENDENT_AMBULATORY_CARE_PROVIDER_SITE_OTHER): Payer: Medicare Other | Admitting: Physician Assistant

## 2019-12-01 ENCOUNTER — Encounter: Payer: Self-pay | Admitting: Physician Assistant

## 2019-12-01 VITALS — Ht 63.0 in | Wt 120.0 lb

## 2019-12-01 DIAGNOSIS — M79672 Pain in left foot: Secondary | ICD-10-CM | POA: Diagnosis not present

## 2019-12-01 DIAGNOSIS — M79671 Pain in right foot: Secondary | ICD-10-CM

## 2019-12-01 NOTE — Progress Notes (Signed)
Office Visit Note   Patient: Judith [name]           Date of Birth: 09-26-1941           MRN: [id number] Visit Date: 12/01/2019              Requested by: Rutherford Guys, MD [address] [place],  [place] [address] PCP: Rutherford Guys, MD  Chief Complaint  Patient presents with  . Left Foot - New Patient (Initial Visit), Pain    BIL FEET nail trim and right GT ingrown toenail  . Right Foot - New Patient (Initial Visit), Pain      HPI: This is a pleasant 78 year old woman who resides at an assisted living. She was receiving regular foot care at her assisted living howerver because of COVID this is no longer occurring. She is requesting her nails trimmed and has a painful ingrown Right Great Toenail.  Assessment & Plan: Visit Diagnoses: No diagnosis found.  Plan: We discussed a wedge resection of the right great toenail but she would like to defer this at this time. If her pain returns she will follow up  Follow-Up Instructions: No follow-ups on file.   Ortho Exam  Patient is alert, oriented, no adenopathy, well-dressed, normal affect, normal respiratory effort. Bilateral feet : palpable DP pulses. Thickened nails but no erythema or drainage . Right great toenail is tender and thickened laterally. Nails were trimmed back and area of pressure on the lateral great nail was addressed  Imaging: No results found. No images are attached to the encounter.  Labs: Lab Results  Component Value Date   HGBA1C 7.9 (H) 08/24/2019   HGBA1C 6.8 (A) 01/22/2019   HGBA1C 6.1 01/24/2018   ESRSEDRATE 48 (H) 04/02/2018   ESRSEDRATE 81 (H) 12/28/2017   CRP 5.7 (H) 04/02/2018   CRP 22.4 (H) 12/28/2017   LABURIC 2.4 (L) 10/29/2017     Lab Results  Component Value Date   ALBUMIN 3.5 (L) 01/22/2019   ALBUMIN 3.7 01/04/2019   ALBUMIN 3.6 09/22/2018   LABURIC 2.4 (L) 10/29/2017    No results found for: MG No results found for: VD25OH  No results found for: PREALBUMIN CBC EXTENDED  Latest Ref Rng & Units 01/22/2019 01/04/2019 04/22/2018  WBC 3.4 - 10.8 x10E3/uL 11.0(H) 12.3(H) 11.6(H)  RBC 3.77 - 5.28 x10E6/uL 4.02 5.10 4.21  HGB 11.1 - 15.9 g/dL 12.8 15.4(H) 11.8(L)  HCT 34.0 - 46.6 % 37.1 48.6(H) 36.8  PLT 150 - 450 x10E3/uL 304 301 302  NEUTROABS 1.7 - 7.7 K/uL - 9.3(H) 9.0(H)  LYMPHSABS 0.7 - 4.0 K/uL - 1.7 1.3     Body mass index is 21.26 kg/m.  Orders:  No orders of the defined types were placed in this encounter.  No orders of the defined types were placed in this encounter.    Procedures: No procedures performed  Clinical Data: No additional findings.  ROS:  All other systems negative, except as noted in the HPI. Review of Systems  Objective: Vital Signs: Ht 5\' 3"  (1.6 m)   Wt 120 lb (54.4 kg)   BMI 21.26 kg/m   Specialty Comments:  No specialty comments available.  PMFS History: Patient Active Problem List   Diagnosis Date Noted  . Multilevel degenerative disc disease 04/03/2018  . Acute blood loss anemia 01/17/2018  . Gallstones 01/17/2018  . Weight loss, unintentional 01/17/2018  . Dilated bile duct   . Elevated liver function tests   . Gastritis  and gastroduodenitis   . S/P partial gastrectomy   . Peripheral neuropathy   . Hypertension   . Gastroparesis   . Duodenal ulcer disease   . Controlled type 2 diabetes mellitus with diabetic neuropathy, with long-term current use of insulin ([place])   . Depression   . Biliary gastritis   . Seasonal allergies   . Presence of cardiac pacemaker   . Gout   . Cervical spine arthritis 04/05/2016  . Long-term insulin use in type 2 diabetes ([place]) 04/05/2016   Past Medical History:  Diagnosis Date  . Anemia   . Arthritis   . Biliary gastritis   . Blood transfusion without reported diagnosis    2007  . Depression   . Diabetes mellitus type 2 with complications ([place])   . Duodenal ulcer disease   . Encounter for long-term (current) use of insulin ([place])   . Gastric ulcer   .  Gastroparesis    severe  . GERD (gastroesophageal reflux disease)   . Gout    tested for gout but was not  . Heart murmur   . Hyperlipidemia    in past not now  . Hypertension   . Hypokalemia, gastrointestinal losses    secondary to severe gastroparesis  . Osteoporosis    just had bone density test possible   . Peripheral neuropathy   . Presence of cardiac pacemaker   . S/P partial gastrectomy    due to severe gastric and gudoenal ulcers  . Seasonal allergies   . Sleep apnea    could not use CPAP  . UTI (urinary tract infection)     Family History  Problem Relation Age of Onset  . Diabetes Mother   . Diabetes Father   . Heart disease Father   . Diabetes Sister   . Heart disease Son   . Colon cancer Neg Hx   . Colon polyps Neg Hx   . Rectal cancer Neg Hx   . Stomach cancer Neg Hx   . Esophageal cancer Neg Hx     Past Surgical History:  Procedure Laterality Date  . COLONOSCOPY  02/11/2018   no polyps, + small internal hemorrhoids  . EUS N/A 12/12/2017   Procedure: UPPER ENDOSCOPIC ULTRASOUND (EUS) RADIAL;  Surgeon: [name], MD;  Location: [facility];  Service: Endoscopy;  Laterality: N/A;  . GASTRECTOMY    . JOINT REPLACEMENT    . pace maker    . PARTIAL GASTRECTOMY    . TONSILLECTOMY     45 years ago  . UPPER GASTROINTESTINAL ENDOSCOPY     Social History   Occupational History  . Not on file  Tobacco Use  . Smoking status: Never Smoker  . Smokeless tobacco: Never Used  Substance and Sexual Activity  . Alcohol use: No    Alcohol/week: 0.0 standard drinks  . Drug use: No  . Sexual activity: Not Currently

## 2019-12-30 DIAGNOSIS — E0841 Diabetes mellitus due to underlying condition with diabetic mononeuropathy: Secondary | ICD-10-CM | POA: Diagnosis not present

## 2020-01-05 DIAGNOSIS — K3184 Gastroparesis: Secondary | ICD-10-CM | POA: Diagnosis not present

## 2020-01-05 DIAGNOSIS — Z6821 Body mass index (BMI) 21.0-21.9, adult: Secondary | ICD-10-CM | POA: Diagnosis not present

## 2020-01-05 DIAGNOSIS — E0841 Diabetes mellitus due to underlying condition with diabetic mononeuropathy: Secondary | ICD-10-CM | POA: Diagnosis not present

## 2020-01-05 DIAGNOSIS — R748 Abnormal levels of other serum enzymes: Secondary | ICD-10-CM | POA: Diagnosis not present

## 2020-01-05 DIAGNOSIS — E0865 Diabetes mellitus due to underlying condition with hyperglycemia: Secondary | ICD-10-CM | POA: Diagnosis not present

## 2020-01-06 DIAGNOSIS — Z23 Encounter for immunization: Secondary | ICD-10-CM | POA: Diagnosis not present

## 2020-01-27 ENCOUNTER — Encounter: Payer: Self-pay | Admitting: Family Medicine

## 2020-01-28 ENCOUNTER — Telehealth: Payer: Self-pay | Admitting: Family Medicine

## 2020-01-28 ENCOUNTER — Telehealth (INDEPENDENT_AMBULATORY_CARE_PROVIDER_SITE_OTHER): Payer: Medicare Other | Admitting: Family Medicine

## 2020-01-28 ENCOUNTER — Other Ambulatory Visit: Payer: Self-pay

## 2020-01-28 ENCOUNTER — Telehealth: Payer: Self-pay

## 2020-01-28 DIAGNOSIS — M539 Dorsopathy, unspecified: Secondary | ICD-10-CM

## 2020-01-28 MED ORDER — MELOXICAM 7.5 MG PO TABS: 7.5000 mg | ORAL_TABLET | Freq: Every day | ORAL | 0 refills | Status: DC | PRN

## 2020-01-28 MED ORDER — CYCLOBENZAPRINE HCL 5 MG PO TABS: 5.0000 mg | ORAL_TABLET | Freq: Two times a day (BID) | ORAL | 0 refills | Status: DC | PRN

## 2020-01-28 NOTE — Telephone Encounter (Signed)
Pt. Daughter called about the pt. having a fever at appt. today on 01/28/20 at  2:20.  She  was sent to [facility] to get rapid Covid test came back neg. Pt. Daughter wanted to inform provider. Would like clinical or provider to contact her. Please advise

## 2020-01-28 NOTE — Progress Notes (Signed)
Virtual Visit Note  I connected with patient on 01/28/20 at 355pm by phone and verified that I am speaking with the correct person using two identifiers. Judith [name] is currently located at home and patient is currently with them during visit. The provider, Rutherford Guys, MD is located in their office at time of visit.  I discussed the limitations, risks, security and privacy concerns of performing an evaluation and management service by telephone and the availability of in person appointments. I also discussed with the patient that there may be a patient responsible charge related to this service. The patient expressed understanding and agreed to proceed.   CC: body aches  HPI  Tested for covid today at assisted living it was negative  4 days of neck, shoulder, hips and goes down to legs Worse when she wakes up A bit better when she walked a bit today She spends most of the time sitting She has similar episodes in the past - told it was from her OA She reports that when she raises her arms it feels as something catches, like she has a tight muscle She denies any red, swollen, warm joints   Allergies  Allergen Reactions  . Asa [Aspirin] Other (See Comments)    Causes ulcers   . Tylenol [Acetaminophen]     Intolerance due to liver damage  . Sulfa Antibiotics Rash    Prior to Admission medications   Medication Sig Start Date End Date Taking? Authorizing Provider  Biotin 1000 MCG tablet Take 1,000 mcg by mouth daily.   Yes [provider]  blood glucose meter kit and supplies KIT Dispense based on patient and insurance preference. Check capillary glucose three times a day, before meals.  Dx E11.65, Z79.4 10/29/17  Yes Rutherford Guys, MD  brimonidine [facility]) 0.2 % ophthalmic solution  12/24/18  Yes [provider]  carvedilol (COREG) 25 MG tablet Take 1 tablet (25 mg total) by mouth 2 (two) times daily with a meal. 01/22/19  Yes Rutherford Guys, MD   colchicine 0.6 MG tablet Take 0.6 mg by mouth daily.   Yes [provider]  diclofenac sodium (VOLTAREN) 1 % GEL Apply 2 g topically 4 (four) times daily. 01/24/18  Yes Rutherford Guys, MD  diphenhydrAMINE (BENADRYL) 25 mg capsule Take 1 capsule (25 mg total) by mouth daily as needed for itching. 05/16/18  Yes Rutherford Guys, MD  DULoxetine (CYMBALTA) 60 MG capsule TAKE 1 CAPSULE (60 MG TOTAL) DAILY BY MOUTH. 04/28/18  Yes Rutherford Guys, MD  famotidine (PEPCID) 20 MG tablet Take 1 tablet (20 mg total) by mouth at bedtime. 06/11/19  Yes Rutherford Guys, MD  gabapentin (NEURONTIN) 100 MG capsule Take 1 capsule (100 mg) every morning and afternoon, and take 4 capsules (442m) at bedtime Patient taking differently: Take 100-400 mg by mouth 4 (four) times daily. 100 mg in the morning, 100 mg in the afternoon & 400 mg at bedtime 04/03/18  Yes SRutherford Guys MD  glucose blood (ONE TOUCH ULTRA TEST) test strip USE TO CHECK GLUCOSE LEVELS 3 TIMES A DAY, DX E11.9, Z79.4 11/30/17  Yes SRutherford Guys MD  HUMALOG KWIKPEN 100 UNIT/ML KwikPen CHECK FSBS BEFORE [facility] AND INJECT PER SSI(0-80.0UNITS)(80-149,5U)(150-180,9U)(181-500,CALL MD) 09/16/19  Yes SRutherford Guys MD  Insulin Glargine (LANTUS SOLOSTAR) 100 UNIT/ML Solostar Pen Inject 26 Units into the skin daily at 10 pm. Patient taking differently: Inject 22 Units into the skin daily.  01/24/18  Yes Rutherford Guys, MD  insulin lispro (HUMALOG) 100 UNIT/ML injection Inject 1-10 Units into the skin 3 (three) times daily before meals. Sliding scale   Yes [provider]  Insulin Pen Needle (B-D ULTRAFINE III SHORT PEN) 31G X 8 MM MISC To use as directed with insulin, four times a day. Dx E11.9, Z79.4 11/08/17  Yes Rutherford Guys, MD  ketorolac (ACULAR) 0.4 % SOLN  01/11/19  Yes [provider]  ketorolac (ACULAR) 0.5 % ophthalmic solution  11/19/18  Yes [provider]  losartan (COZAAR) 100 MG tablet Take 1 tablet  (100 mg total) by mouth daily. 11/19/18  Yes Croitoru, Mihai, MD  Multiple Vitamin (MULTIVITAMIN) tablet Take 1 tablet daily by mouth.   Yes [provider]  mupirocin ointment (BACTROBAN) 2 %  01/06/19  Yes [provider]  ofloxacin (OCUFLOX) 0.3 % ophthalmic solution  01/01/19  Yes [provider]  pantoprazole (PROTONIX) 40 MG tablet TAKE 1 TABLET (40 MG TOTAL) DAILY BY MOUTH. 04/28/18  Yes Rutherford Guys, MD  prednisoLONE acetate (PRED FORTE) 1 % ophthalmic suspension  12/24/18  Yes [provider]  triamcinolone cream (KENALOG) 0.5 % Apply 1 application topically 2 (two) times daily. 09/22/18  Yes Rutherford Guys, MD    Past Medical History:  Diagnosis Date  . Anemia   . Arthritis   . Biliary gastritis   . Blood transfusion without reported diagnosis    2007  . Depression   . Diabetes mellitus type 2 with complications ([place])   . Duodenal ulcer disease   . Encounter for long-term (current) use of insulin ([place])   . Gastric ulcer   . Gastroparesis    severe  . GERD (gastroesophageal reflux disease)   . Gout    tested for gout but was not  . Heart murmur   . Hyperlipidemia    in past not now  . Hypertension   . Hypokalemia, gastrointestinal losses    secondary to severe gastroparesis  . Osteoporosis    just had bone density test possible   . Peripheral neuropathy   . Presence of cardiac pacemaker   . S/P partial gastrectomy    due to severe gastric and gudoenal ulcers  . Seasonal allergies   . Sleep apnea    could not use CPAP  . UTI (urinary tract infection)     Past Surgical History:  Procedure Laterality Date  . COLONOSCOPY  02/11/2018   no polyps, + small internal hemorrhoids  . EUS N/A 12/12/2017   Procedure: UPPER ENDOSCOPIC ULTRASOUND (EUS) RADIAL;  Surgeon: [name], MD;  Location: [facility];  Service: Endoscopy;  Laterality: N/A;  . GASTRECTOMY    . JOINT REPLACEMENT    . pace maker    . PARTIAL GASTRECTOMY     . TONSILLECTOMY     45 years ago  . UPPER GASTROINTESTINAL ENDOSCOPY      Social History   Tobacco Use  . Smoking status: Never Smoker  . Smokeless tobacco: Never Used  Substance Use Topics  . Alcohol use: No    Alcohol/week: 0.0 standard drinks    Family History  Problem Relation Age of Onset  . Diabetes Mother   . Diabetes Father   . Heart disease Father   . Diabetes Sister   . Heart disease Son   . Colon cancer Neg Hx   . Colon polyps Neg Hx   . Rectal cancer Neg Hx   . Stomach cancer  Neg Hx   . Esophageal cancer Neg Hx     ROS Per hpi  Objective  Vitals as reported by the patient: none  Gen: AAOx3, NAD Speaking in full sentences, normal resp effort   ASSESSMENT and PLAN  1. Multilevel degenerative disc disease Reviewed meds r/se/b. Encouraged her to be more active  Other orders - meloxicam (MOBIC) 7.5 MG tablet; Take 1 tablet (7.5 mg total) by mouth daily as needed for pain. - cyclobenzaprine (FLEXERIL) 5 MG tablet; Take 1 tablet (5 mg total) by mouth 2 (two) times daily as needed for muscle spasms.  FOLLOW-UP: prn   The above assessment and management plan was discussed with the patient. The patient verbalized understanding of and has agreed to the management plan. Patient is aware to call the clinic if symptoms persist or worsen. Patient is aware when to return to the clinic for a follow-up visit. Patient educated on when it is appropriate to go to the emergency department.    I provided 11 minutes of non-face-to-face time during this encounter.  Rutherford Guys, MD Primary Care at [place], [place] [address] Ph.  [phone] Fax [phone]

## 2020-01-28 NOTE — Telephone Encounter (Signed)
Spoke with Rock Nephew, Anitra L, CMA, and this has already been addressed.

## 2020-01-28 NOTE — Telephone Encounter (Signed)
Pt rx faxed to [phone] Judith [name] for meloxicam

## 2020-01-28 NOTE — Progress Notes (Signed)
Pt is having pain all over the body, she also has fever of 99.9 during triage. She received her 1st covid vaccine 3 wks ago. She is schedule to get the 2nd dose next wk. She says she feels really bad and was suggested to go to the er by family. She is asking for an hour before she is called

## 2020-02-03 DIAGNOSIS — Z23 Encounter for immunization: Secondary | ICD-10-CM | POA: Diagnosis not present

## 2020-02-10 DIAGNOSIS — B351 Tinea unguium: Secondary | ICD-10-CM | POA: Diagnosis not present

## 2020-02-10 DIAGNOSIS — E1140 Type 2 diabetes mellitus with diabetic neuropathy, unspecified: Secondary | ICD-10-CM | POA: Diagnosis not present

## 2020-02-10 DIAGNOSIS — R2689 Other abnormalities of gait and mobility: Secondary | ICD-10-CM | POA: Diagnosis not present

## 2020-02-10 DIAGNOSIS — L603 Nail dystrophy: Secondary | ICD-10-CM | POA: Diagnosis not present

## 2020-02-10 DIAGNOSIS — M79673 Pain in unspecified foot: Secondary | ICD-10-CM | POA: Diagnosis not present

## 2020-02-16 DIAGNOSIS — Z6821 Body mass index (BMI) 21.0-21.9, adult: Secondary | ICD-10-CM | POA: Diagnosis not present

## 2020-02-16 DIAGNOSIS — K3184 Gastroparesis: Secondary | ICD-10-CM | POA: Diagnosis not present

## 2020-02-16 DIAGNOSIS — E0841 Diabetes mellitus due to underlying condition with diabetic mononeuropathy: Secondary | ICD-10-CM | POA: Diagnosis not present

## 2020-02-16 DIAGNOSIS — R748 Abnormal levels of other serum enzymes: Secondary | ICD-10-CM | POA: Diagnosis not present

## 2020-02-16 DIAGNOSIS — E0865 Diabetes mellitus due to underlying condition with hyperglycemia: Secondary | ICD-10-CM | POA: Diagnosis not present

## 2020-03-04 NOTE — Telephone Encounter (Signed)
No action needed

## 2020-05-17 DIAGNOSIS — R748 Abnormal levels of other serum enzymes: Secondary | ICD-10-CM | POA: Diagnosis not present

## 2020-05-17 DIAGNOSIS — E0865 Diabetes mellitus due to underlying condition with hyperglycemia: Secondary | ICD-10-CM | POA: Diagnosis not present

## 2020-05-17 DIAGNOSIS — Z6821 Body mass index (BMI) 21.0-21.9, adult: Secondary | ICD-10-CM | POA: Diagnosis not present

## 2020-05-17 DIAGNOSIS — K3184 Gastroparesis: Secondary | ICD-10-CM | POA: Diagnosis not present

## 2020-05-17 DIAGNOSIS — E0841 Diabetes mellitus due to underlying condition with diabetic mononeuropathy: Secondary | ICD-10-CM | POA: Diagnosis not present

## 2020-05-20 ENCOUNTER — Telehealth: Payer: Self-pay

## 2020-05-20 NOTE — Telephone Encounter (Signed)
Received paperwork from [facility].   Reviewed by provider, signed and faxed back with confirmation to [phone]

## 2020-05-26 DIAGNOSIS — B351 Tinea unguium: Secondary | ICD-10-CM | POA: Diagnosis not present

## 2020-05-26 DIAGNOSIS — L605 Yellow nail syndrome: Secondary | ICD-10-CM | POA: Diagnosis not present

## 2020-05-26 DIAGNOSIS — M79673 Pain in unspecified foot: Secondary | ICD-10-CM | POA: Diagnosis not present

## 2020-05-26 DIAGNOSIS — E1140 Type 2 diabetes mellitus with diabetic neuropathy, unspecified: Secondary | ICD-10-CM | POA: Diagnosis not present

## 2020-05-26 DIAGNOSIS — R2681 Unsteadiness on feet: Secondary | ICD-10-CM | POA: Diagnosis not present

## 2020-06-20 ENCOUNTER — Encounter: Payer: Self-pay | Admitting: Cardiovascular Disease

## 2020-06-20 ENCOUNTER — Ambulatory Visit (INDEPENDENT_AMBULATORY_CARE_PROVIDER_SITE_OTHER): Payer: Medicare Other | Admitting: Cardiovascular Disease

## 2020-06-20 ENCOUNTER — Other Ambulatory Visit: Payer: Self-pay

## 2020-06-20 VITALS — BP 152/80 | HR 79 | Ht 63.0 in | Wt 134.0 lb

## 2020-06-20 DIAGNOSIS — I442 Atrioventricular block, complete: Secondary | ICD-10-CM

## 2020-06-20 DIAGNOSIS — E1140 Type 2 diabetes mellitus with diabetic neuropathy, unspecified: Secondary | ICD-10-CM

## 2020-06-20 DIAGNOSIS — Z794 Long term (current) use of insulin: Secondary | ICD-10-CM

## 2020-06-20 DIAGNOSIS — I10 Essential (primary) hypertension: Secondary | ICD-10-CM | POA: Diagnosis not present

## 2020-06-20 DIAGNOSIS — Z950 Presence of cardiac pacemaker: Secondary | ICD-10-CM | POA: Diagnosis not present

## 2020-06-20 NOTE — Progress Notes (Signed)
Cardiology office note:    Date:  06/21/2020   ID:  Judith [name], DOB 17-Nov-1941, MRN [id number]  PCP:  Rutherford Guys, MD  Cardiologist:  Sanda Klein, MD    Referring MD: Rutherford Guys, MD   Chief complaint: pacemaker check  History of Present Illness:    Judith [name] is a 79 y.o. female with a hx of pacemaker implantation in [place].  She has complete heart block and is pacemaker dependent.  She moved to [place] while recovering from a fall but now is a resident at [place] with no plans of returning to [place].    The patient specifically denies any chest pain at rest exertion, dyspnea at rest or with exertion, orthopnea, paroxysmal nocturnal dyspnea, syncope, palpitations, focal neurological deficits, intermittent claudication, lower extremity edema, unexplained weight gain, cough, hemoptysis or wheezing.  Blood pressure slightly high today, usually lower at her assisted living facility.  She is not sure whether or not she is still taking the losartan.  She is sure that she is still taking carvedilol.  Device interrogation shows normal function.  Estimated generator longevity is 2 years.  She has a Forensic scientist device that is not capable of remote downloads.  She has complete heart block and 100% ventricular pacing.  There is no escape rhythm.  She also has 7% atrial pacing.  There have been no episodes of mode switch or high ventricular rates.  Her initial pacemaker was implanted in 2007.  It appears that her ventricular lead is still the original one (Guidant [id number] implanted July 05, 2006).  June 09, 2010 she underwent a pacemaker generator change out and placement of a new atrial lead (Guidant [id number]).   At one previous device check we had detected a 6-second episode of possible atrial fibrillation.  Her most recent lipid profile was performed in January 2020 and was excellent with an LDL of 65.  Her hemoglobin A1c last August was 7.9%.  She is working  on improving glycemic control.  She does not have a history of coronary disease.  She reports a normal stress test in the past.  She had an echocardiogram just a few months before coming to [place] and was told that her heart pumping strength is normal.  She was told that she has a murmur which is due to a mildly leaky valve.  There is no reported history of heart failure that I am aware of.   She does not have intermittent claudication and reports that lower extremity ABIs have been performed in the past with normal results.  She has never had a stroke or TIA.  She was taking lovastatin for lipid lowering but has been off this medication for a while.  She has lost a lot of weight since then.  Past Medical History:  Diagnosis Date  . Anemia   . Arthritis   . Biliary gastritis   . Blood transfusion without reported diagnosis    2007  . Cervical spine arthritis 04/05/2016  . Controlled type 2 diabetes mellitus with diabetic neuropathy, with long-term current use of insulin ([place])   . Depression   . Diabetes mellitus type 2 with complications ([place])   . Dilated bile duct   . Duodenal ulcer disease   . Encounter for long-term (current) use of insulin ([place])   . Gallstones 01/17/2018  . Gastric ulcer   . Gastritis and gastroduodenitis   . Gastroparesis    severe  . GERD (gastroesophageal reflux  disease)   . Gout    tested for gout but was not  . Heart murmur   . Hyperlipidemia    in past not now  . Hypertension   . Hypokalemia, gastrointestinal losses    secondary to severe gastroparesis  . Long-term insulin use in type 2 diabetes ([place]) 04/05/2016  . Osteoporosis    just had bone density test possible   . Peripheral neuropathy   . Presence of cardiac pacemaker   . S/P partial gastrectomy    due to severe gastric and gudoenal ulcers  . Seasonal allergies   . Sleep apnea    could not use CPAP  . UTI (urinary tract infection)     Past Surgical History:  Procedure Laterality  Date  . COLONOSCOPY  02/11/2018   no polyps, + small internal hemorrhoids  . EUS N/A 12/12/2017   Procedure: UPPER ENDOSCOPIC ULTRASOUND (EUS) RADIAL;  Surgeon: [name], MD;  Location: [facility];  Service: Endoscopy;  Laterality: N/A;  . GASTRECTOMY    . JOINT REPLACEMENT    . pace maker    . PARTIAL GASTRECTOMY    . TONSILLECTOMY     45 years ago  . UPPER GASTROINTESTINAL ENDOSCOPY      Current Medications: Current Meds  Medication Sig  . Biotin 1000 MCG tablet Take 1,000 mcg by mouth daily.  . blood glucose meter kit and supplies KIT Dispense based on patient and insurance preference. Check capillary glucose three times a day, before meals.  Dx E11.65, Z79.4  . carvedilol (COREG) 25 MG tablet Take 1 tablet (25 mg total) by mouth 2 (two) times daily with a meal.  . colchicine 0.6 MG tablet Take 0.6 mg by mouth daily.  . diphenhydrAMINE (BENADRYL) 25 mg capsule Take 1 capsule (25 mg total) by mouth daily as needed for itching.  . DULoxetine (CYMBALTA) 60 MG capsule TAKE 1 CAPSULE (60 MG TOTAL) DAILY BY MOUTH.  . famotidine (PEPCID) 20 MG tablet Take 1 tablet (20 mg total) by mouth at bedtime.  . gabapentin (NEURONTIN) 100 MG capsule Take 1 capsule (100 mg) every morning and afternoon, and take 4 capsules (466m) at bedtime (Patient taking differently: Take 100-400 mg by mouth 4 (four) times daily. 100 mg in the morning, 100 mg in the afternoon & 400 mg at bedtime)  . glucose blood (ONE TOUCH ULTRA TEST) test strip USE TO CHECK GLUCOSE LEVELS 3 TIMES A DAY, DX E11.9, Z79.4  . HUMALOG KWIKPEN 100 UNIT/ML KwikPen CHECK FSBS BEFORE EACH MEAL AND INJECT PER SSI(0-80.0UNITS)(80-149,5U)(150-180,9U)(181-500,CALL MD)  . Insulin Glargine (LANTUS SOLOSTAR) 100 UNIT/ML Solostar Pen Inject 26 Units into the skin daily at 10 pm. (Patient taking differently: Inject 22 Units into the skin daily. )  . insulin lispro (HUMALOG) 100 UNIT/ML injection Inject 1-10 Units into the skin 3 (three)  times daily before meals. Sliding scale  . Insulin Pen Needle (B-D ULTRAFINE III SHORT PEN) 31G X 8 MM MISC To use as directed with insulin, four times a day. Dx E11.9, Z79.4  . losartan (COZAAR) 100 MG tablet Take 100 mg by mouth daily.  . Multiple Vitamin (MULTIVITAMIN) tablet Take 1 tablet daily by mouth.  . pantoprazole (PROTONIX) 40 MG tablet TAKE 1 TABLET (40 MG TOTAL) DAILY BY MOUTH.  . [DISCONTINUED] brimonidine (ALPHAGAN) 0.2 % ophthalmic solution   . [DISCONTINUED] cyclobenzaprine (FLEXERIL) 5 MG tablet Take 1 tablet (5 mg total) by mouth 2 (two) times daily as needed for muscle spasms.  . [DISCONTINUED]  diclofenac sodium (VOLTAREN) 1 % GEL Apply 2 g topically 4 (four) times daily.  . [DISCONTINUED] ketorolac (ACULAR) 0.4 % SOLN   . [DISCONTINUED] ketorolac (ACULAR) 0.5 % ophthalmic solution   . [DISCONTINUED] losartan (COZAAR) 100 MG tablet Take 1 tablet (100 mg total) by mouth daily.  . [DISCONTINUED] meloxicam (MOBIC) 7.5 MG tablet Take 1 tablet (7.5 mg total) by mouth daily as needed for pain.  . [DISCONTINUED] mupirocin ointment (BACTROBAN) 2 %   . [DISCONTINUED] ofloxacin (OCUFLOX) 0.3 % ophthalmic solution   . [DISCONTINUED] prednisoLONE acetate (PRED FORTE) 1 % ophthalmic suspension   . [DISCONTINUED] triamcinolone cream (KENALOG) 0.5 % Apply 1 application topically 2 (two) times daily.     Allergies:   Asa [aspirin], Tylenol [acetaminophen], and Sulfa antibiotics   Social History   Socioeconomic History  . Marital status: Widowed    Spouse name: Not on file  . Number of children: 3  . Years of education: Not on file  . Highest education level: High school graduate  Occupational History  . Not on file  Tobacco Use  . Smoking status: Never Smoker  . Smokeless tobacco: Never Used  Vaping Use  . Vaping Use: Never used  Substance and Sexual Activity  . Alcohol use: No    Alcohol/week: 0.0 standard drinks  . Drug use: No  . Sexual activity: Not Currently  Other  Topics Concern  . Not on file  Social History Narrative   Used to live independently in [place]   However had significant issues with severe gastroparesis resulting in severe hypokalemia and general weakness and fall resulting in hospitalization and SNF rehab   Now (10/2017) living with her daughter in [place] until able to stabilize and regain independence.      May 2019   Patient living in assisted living, [place]   Social Determinants of Health   Financial Resource Strain:   . Difficulty of Paying Living Expenses:   Food Insecurity:   . Worried About Charity fundraiser in the Last Year:   . Arboriculturist in the Last Year:   Transportation Needs:   . Film/video editor (Medical):   Marland Kitchen Lack of Transportation (Non-Medical):   Physical Activity:   . Days of Exercise per Week:   . Minutes of Exercise per Session:   Stress:   . Feeling of Stress :   Social Connections:   . Frequency of Communication with Friends and Family:   . Frequency of Social Gatherings with Friends and Family:   . Attends Religious Services:   . Active Member of Clubs or Organizations:   . Attends Archivist Meetings:   Marland Kitchen Marital Status:      Family History: The patient's family history significant for the absence of premature cardiac illness  ROS:   Please see the history of present illness.    All other systems are reviewed and are negative.   EKGs/Labs/Other Studies Reviewed:    The following studies were reviewed today: Comprehensive pacemaker interrogation  EKG:  EKG is ordered today.  Atrial sensed, ventricular paced rhythm with very broad QRS at 174 ms.  Appropriately prolonged QTC 505 ms. Recent Labs: 08/24/2019: BUN 18; Creatinine, Ser 1.09; Potassium 4.4; Sodium 134  Recent Lipid Panel    Component Value Date/Time   CHOL 125 01/22/2019 1119   TRIG 72 01/22/2019 1119   HDL 46 01/22/2019 1119   CHOLHDL 2.7 01/22/2019 1119   LDLCALC 65 01/22/2019 1119  Physical Exam:     VS:  BP (!) 152/80   Pulse 79   Ht '5\' 3"'  (1.6 m)   Wt 134 lb (60.8 kg)   SpO2 96%   BMI 23.74 kg/m     Wt Readings from Last 3 Encounters:  06/20/20 134 lb (60.8 kg)  12/01/19 120 lb (54.4 kg)  09/21/19 120 lb (54.4 kg)      General: Alert, oriented x3, no distress, healthy left subclavian pacemaker site Head: no evidence of trauma, PERRL, EOMI, no exophtalmos or lid lag, no myxedema, no xanthelasma; normal ears, nose and oropharynx Neck: normal jugular venous pulsations and no hepatojugular reflux; brisk carotid pulses without delay and no carotid bruits Chest: clear to auscultation, no signs of consolidation by percussion or palpation, normal fremitus, symmetrical and full respiratory excursions Cardiovascular: normal position and quality of the apical impulse, regular rhythm, normal first and paradoxically split second heart sounds, no murmurs, rubs or gallops Abdomen: no tenderness or distention, no masses by palpation, no abnormal pulsatility or arterial bruits, normal bowel sounds, no hepatosplenomegaly Extremities: no clubbing, cyanosis or edema; 2+ radial, ulnar and brachial pulses bilaterally; 2+ right femoral, posterior tibial and dorsalis pedis pulses; 2+ left femoral, posterior tibial and dorsalis pedis pulses; no subclavian or femoral bruits Neurological: grossly nonfocal Psych: Normal mood and affect   ASSESSMENT:    1. Essential hypertension    PLAN:    In order of problems listed above:  1. CHB: Pacemaker dependent.  No complaints of syncope or dizziness or palpitations.  2. PPM: We will bring her back in 6 months; we will increase the frequency of office checks to every 3 months next year. 3. DM: With multiple complications including gastroparesis and neuropathy.  Still working on getting her glycemic control optimized.  Good lipid profile but she is due a recheck. 4. HTN: Blood pressure slightly high today.  Not clear whether she is still taking the  losartan.  We will ask for an exact list of her medications from her facility.  She is also on high-dose carvedilol.  Avoid diuretics due to history of pseudogout and risk of dehydration in the setting of gastroparesis.    Medication Adjustments/Labs and Tests Ordered: Current medicines are reviewed at length with the patient today.  Concerns regarding medicines are outlined above.  Orders Placed This Encounter  Procedures  . EKG 12-Lead   No orders of the defined types were placed in this encounter.  Patient Instructions  Medication Instructions:  No changes *If you need a refill on your cardiac medications before your next appointment, please call your pharmacy*   Lab Work: None ordered If you have labs (blood work) drawn today and your tests are completely normal, you will receive your results only by: Marland Kitchen MyChart Message (if you have MyChart) OR . A paper copy in the mail If you have any lab test that is abnormal or we need to change your treatment, we will call you to review the results.   Testing/Procedures: None ordered   Follow-Up: At [facility], you and your health needs are our priority.  As part of our continuing mission to provide you with exceptional heart care, we have created designated Provider Care Teams.  These Care Teams include your primary Cardiologist (physician) and Advanced Practice Providers (APPs -  Physician Assistants and Nurse Practitioners) who all work together to provide you with the care you need, when you need it.  We recommend signing up for the patient portal  called "MyChart".  Sign up information is provided on this After Visit Summary.  MyChart is used to connect with patients for Virtual Visits (Telemedicine).  Patients are able to view lab/test results, encounter notes, upcoming appointments, etc.  Non-urgent messages can be sent to your provider as well.   To learn more about what you can do with MyChart, go to [email or web address].     Your next appointment:   6 month(s)  The format for your next appointment:   In Person  Provider:   Sanda Klein, MD      Signed, Sanda Klein, MD  06/21/2020 3:59 PM    [place]

## 2020-06-20 NOTE — Patient Instructions (Signed)

## 2020-07-19 ENCOUNTER — Other Ambulatory Visit: Payer: Self-pay

## 2020-07-19 ENCOUNTER — Encounter (INDEPENDENT_AMBULATORY_CARE_PROVIDER_SITE_OTHER): Payer: Medicare Other | Admitting: Ophthalmology

## 2020-07-19 DIAGNOSIS — H43813 Vitreous degeneration, bilateral: Secondary | ICD-10-CM | POA: Diagnosis not present

## 2020-07-19 DIAGNOSIS — H35033 Hypertensive retinopathy, bilateral: Secondary | ICD-10-CM | POA: Diagnosis not present

## 2020-07-19 DIAGNOSIS — I10 Essential (primary) hypertension: Secondary | ICD-10-CM | POA: Diagnosis not present

## 2020-07-19 DIAGNOSIS — E113311 Type 2 diabetes mellitus with moderate nonproliferative diabetic retinopathy with macular edema, right eye: Secondary | ICD-10-CM | POA: Diagnosis not present

## 2020-07-19 DIAGNOSIS — E113392 Type 2 diabetes mellitus with moderate nonproliferative diabetic retinopathy without macular edema, left eye: Secondary | ICD-10-CM | POA: Diagnosis not present

## 2020-07-19 DIAGNOSIS — E11311 Type 2 diabetes mellitus with unspecified diabetic retinopathy with macular edema: Secondary | ICD-10-CM | POA: Diagnosis not present

## 2020-07-26 DIAGNOSIS — K219 Gastro-esophageal reflux disease without esophagitis: Secondary | ICD-10-CM | POA: Diagnosis not present

## 2020-07-26 DIAGNOSIS — E119 Type 2 diabetes mellitus without complications: Secondary | ICD-10-CM | POA: Diagnosis not present

## 2020-07-26 DIAGNOSIS — M25439 Effusion, unspecified wrist: Secondary | ICD-10-CM | POA: Diagnosis not present

## 2020-07-26 DIAGNOSIS — Z79899 Other long term (current) drug therapy: Secondary | ICD-10-CM | POA: Diagnosis not present

## 2020-07-26 DIAGNOSIS — M25532 Pain in left wrist: Secondary | ICD-10-CM | POA: Diagnosis not present

## 2020-07-26 DIAGNOSIS — M1189 Other specified crystal arthropathies, multiple sites: Secondary | ICD-10-CM | POA: Diagnosis not present

## 2020-07-26 DIAGNOSIS — R768 Other specified abnormal immunological findings in serum: Secondary | ICD-10-CM | POA: Diagnosis not present

## 2020-07-26 DIAGNOSIS — M1990 Unspecified osteoarthritis, unspecified site: Secondary | ICD-10-CM | POA: Diagnosis not present

## 2020-07-28 DIAGNOSIS — L603 Nail dystrophy: Secondary | ICD-10-CM | POA: Diagnosis not present

## 2020-07-28 DIAGNOSIS — M79673 Pain in unspecified foot: Secondary | ICD-10-CM | POA: Diagnosis not present

## 2020-07-28 DIAGNOSIS — M19079 Primary osteoarthritis, unspecified ankle and foot: Secondary | ICD-10-CM | POA: Diagnosis not present

## 2020-07-28 DIAGNOSIS — R2681 Unsteadiness on feet: Secondary | ICD-10-CM | POA: Diagnosis not present

## 2020-07-28 DIAGNOSIS — B351 Tinea unguium: Secondary | ICD-10-CM | POA: Diagnosis not present

## 2020-08-09 ENCOUNTER — Ambulatory Visit: Payer: Medicare Other | Admitting: Family Medicine

## 2020-08-18 DIAGNOSIS — E0865 Diabetes mellitus due to underlying condition with hyperglycemia: Secondary | ICD-10-CM | POA: Diagnosis not present

## 2020-08-18 DIAGNOSIS — K3184 Gastroparesis: Secondary | ICD-10-CM | POA: Diagnosis not present

## 2020-08-18 DIAGNOSIS — Z6821 Body mass index (BMI) 21.0-21.9, adult: Secondary | ICD-10-CM | POA: Diagnosis not present

## 2020-08-18 DIAGNOSIS — Z79899 Other long term (current) drug therapy: Secondary | ICD-10-CM | POA: Diagnosis not present

## 2020-08-18 DIAGNOSIS — R748 Abnormal levels of other serum enzymes: Secondary | ICD-10-CM | POA: Diagnosis not present

## 2020-08-18 DIAGNOSIS — E0841 Diabetes mellitus due to underlying condition with diabetic mononeuropathy: Secondary | ICD-10-CM | POA: Diagnosis not present

## 2020-08-23 ENCOUNTER — Other Ambulatory Visit: Payer: Self-pay

## 2020-08-23 ENCOUNTER — Ambulatory Visit (INDEPENDENT_AMBULATORY_CARE_PROVIDER_SITE_OTHER): Payer: Medicare Other | Admitting: Family Medicine

## 2020-08-23 ENCOUNTER — Encounter: Payer: Self-pay | Admitting: Family Medicine

## 2020-08-23 VITALS — BP 138/73 | HR 72 | Temp 97.9°F | Resp 15 | Ht 63.0 in | Wt 137.2 lb

## 2020-08-23 DIAGNOSIS — Z950 Presence of cardiac pacemaker: Secondary | ICD-10-CM | POA: Diagnosis not present

## 2020-08-23 DIAGNOSIS — K3184 Gastroparesis: Secondary | ICD-10-CM

## 2020-08-23 DIAGNOSIS — E1140 Type 2 diabetes mellitus with diabetic neuropathy, unspecified: Secondary | ICD-10-CM | POA: Diagnosis not present

## 2020-08-23 DIAGNOSIS — I10 Essential (primary) hypertension: Secondary | ICD-10-CM

## 2020-08-23 DIAGNOSIS — Z794 Long term (current) use of insulin: Secondary | ICD-10-CM

## 2020-08-23 MED ORDER — LANTUS SOLOSTAR 100 UNIT/ML ~~LOC~~ SOPN: 37.0000 [IU] | PEN_INJECTOR | Freq: Every day | SUBCUTANEOUS | Status: DC

## 2020-08-23 NOTE — Patient Instructions (Signed)
° ° ° °  If you have lab work done today you will be contacted with your lab results within the next 2 weeks.  If you have not heard from us then please contact us. The fastest way to get your results is to register for My Chart. ° ° °IF you received an x-ray today, you will receive an invoice from [facility]. Please contact [facility] at [phone] with questions or concerns regarding your invoice.  ° °IF you received labwork today, you will receive an invoice from [facility]. Please contact [facility] at [phone] with questions or concerns regarding your invoice.  ° °Our billing staff will not be able to assist you with questions regarding bills from these companies. ° °You will be contacted with the lab results as soon as they are available. The fastest way to get your results is to activate your My Chart account. Instructions are located on the last page of this paperwork. If you have not heard from us regarding the results in 2 weeks, please contact this office. °  ° ° ° °

## 2020-08-23 NOTE — Progress Notes (Signed)
8/31/20219:15 AM  Judith [name] [birth year], 79 y.o., female [id number]  Chief Complaint  Patient presents with  . Weight Gain    pt has been walking every day but reports she has been eating more due to increased insulin, pt eats desserts to keep BG up was advised by endo to eat 2-3 carbohydrates per meal     HPI:   Patient is a 79 y.o. female with past medical history significant for DM2 insulin dependent, HTN, pacemaker, gastroparesis, polyarthritis, gastric ulcers, depressionwho presents today for routine follow up  Last OV Feb 2021  Lives in [place] - [facility] Dr Garnet Koyanagi, last OV may 2021 Cards Dr Sallyanne Kuster, last OV June 2021  She is overall doing well She reports that lantus was increased to 37 units at last visit Went to eye doctor, Dr Zigmund Daniel, treated right eye Saw rheum Dr Aryl, for wrist pain, got cortisone injection She has neuropathy on left leg from knee down, gabapentin works well for her, takes at bedtime, no side effects No falls Depression well controlled on cymbalta Completed covid vaccine at [facility], will also get flu and covid booster vaccine there She walks every day Had lab work done last week for upcoming endo appt Med list reconcilled  Depression screen [facility] 2/9 08/23/2020 01/28/2020 09/21/2019  Decreased Interest 0 0 0  Down, Depressed, Hopeless 0 0 0  PHQ - 2 Score 0 0 0  Altered sleeping - - -  Tired, decreased energy - - -  Change in appetite - - -  Feeling bad or failure about yourself  - - -  Trouble concentrating - - -  Moving slowly or fidgety/restless - - -  Suicidal thoughts - - -  PHQ-9 Score - - -  Difficult doing work/chores - - -    Fall Risk  08/23/2020 09/21/2019 03/05/2019 01/22/2019 10/09/2018  Falls in the past year? 0 0 0 0 Yes  Number falls in past yr: 0 0 0 - 1  Injury with Fall? 0 0 0 - No  Comment - - - - -  Risk Factor Category  - - - - -  Risk for fall due to : No Fall Risks - - - -  Follow up Falls evaluation  completed Falls evaluation completed;Education provided;Falls prevention discussed - - -     Allergies  Allergen Reactions  . Asa [Aspirin] Other (See Comments)    Causes ulcers   . Tylenol [Acetaminophen]     Intolerance due to liver damage  . Sulfa Antibiotics Rash    Prior to Admission medications   Medication Sig Start Date End Date Taking? Authorizing Provider  Biotin 1000 MCG tablet Take 1,000 mcg by mouth daily.   Yes [provider]  blood glucose meter kit and supplies KIT Dispense based on patient and insurance preference. Check capillary glucose three times a day, before meals.  Dx E11.65, Z79.4 10/29/17  Yes Rutherford Guys, MD  carvedilol (COREG) 25 MG tablet Take 1 tablet (25 mg total) by mouth 2 (two) times daily with a meal. 01/22/19  Yes Rutherford Guys, MD  colchicine 0.6 MG tablet Take 0.6 mg by mouth daily.   Yes [provider]  diphenhydrAMINE (BENADRYL) 25 mg capsule Take 1 capsule (25 mg total) by mouth daily as needed for itching. 05/16/18  Yes Rutherford Guys, MD  DULoxetine (CYMBALTA) 60 MG capsule TAKE 1 CAPSULE (60 MG TOTAL) DAILY BY MOUTH. 04/28/18  Yes Rutherford Guys, MD  famotidine (  PEPCID) 20 MG tablet Take 1 tablet (20 mg total) by mouth at bedtime. 06/11/19  Yes Rutherford Guys, MD  gabapentin (NEURONTIN) 100 MG capsule Take 1 capsule (100 mg) every morning and afternoon, and take 4 capsules (419m) at bedtime Patient taking differently: Take 100-400 mg by mouth 4 (four) times daily. 100 mg in the morning, 100 mg in the afternoon & 400 mg at bedtime 04/03/18  Yes SRutherford Guys MD  glucose blood (ONE TOUCH ULTRA TEST) test strip USE TO CHECK GLUCOSE LEVELS 3 TIMES A DAY, DX E11.9, Z79.4 11/30/17  Yes SRutherford Guys MD  HUMALOG KWIKPEN 100 UNIT/ML KwikPen CHECK FSBS BE[facility] IN[facility] PER SSI(0-80.0UNITS)(80-149,5U)(150-180,9U)(181-500,CALL MD) 09/16/19  Yes SRutherford Guys MD  Insulin Glargine (LANTUS SOLOSTAR) 100 UNIT/ML  Solostar Pen Inject 26 Units into the skin daily at 10 pm. Patient taking differently: Inject 22 Units into the skin daily.  01/24/18  Yes SRutherford Guys MD  insulin lispro (HUMALOG) 100 UNIT/ML injection Inject 1-10 Units into the skin 3 (three) times daily before meals. Sliding scale   Yes [provider]  Insulin Pen Needle (B-D ULTRAFINE III SHORT PEN) 31G X 8 MM MISC To use as directed with insulin, four times a day. Dx E11.9, Z79.4 11/08/17  Yes SRutherford Guys MD  losartan (COZAAR) 100 MG tablet Take 100 mg by mouth daily.   Yes [provider]  Multiple Vitamin (MULTIVITAMIN) tablet Take 1 tablet daily by mouth.   Yes [provider]  pantoprazole (PROTONIX) 40 MG tablet TAKE 1 TABLET (40 MG TOTAL) DAILY BY MOUTH. 04/28/18  Yes SRutherford Guys MD    Past Medical History:  Diagnosis Date  . Anemia   . Arthritis   . Biliary gastritis   . Blood transfusion without reported diagnosis    2007  . Cervical spine arthritis 04/05/2016  . Controlled type 2 diabetes mellitus with diabetic neuropathy, with long-term current use of insulin ([place]   . Depression   . Diabetes mellitus type 2 with complications ([place]   . Dilated bile duct   . Duodenal ulcer disease   . Encounter for long-term (current) use of insulin ([place]   . Gallstones 01/17/2018  . Gastric ulcer   . Gastritis and gastroduodenitis   . Gastroparesis    severe  . GERD (gastroesophageal reflux disease)   . Gout    tested for gout but was not  . Heart murmur   . Hyperlipidemia    in past not now  . Hypertension   . Hypokalemia, gastrointestinal losses    secondary to severe gastroparesis  . Long-term insulin use in type 2 diabetes ([place] 04/05/2016  . Osteoporosis    just had bone density test possible   . Peripheral neuropathy   . Presence of cardiac pacemaker   . S/P partial gastrectomy    due to severe gastric and gudoenal ulcers  . Seasonal allergies   . Sleep apnea    could not use  CPAP  . UTI (urinary tract infection)     Past Surgical History:  Procedure Laterality Date  . COLONOSCOPY  02/11/2018   no polyps, + small internal hemorrhoids  . EUS N/A 12/12/2017   Procedure: UPPER ENDOSCOPIC ULTRASOUND (EUS) RADIAL;  Surgeon: JMilus Banister MD;  Location: [facility];  Service: Endoscopy;  Laterality: N/A;  . GASTRECTOMY    . JOINT REPLACEMENT    . pace maker    . PARTIAL GASTRECTOMY    .  TONSILLECTOMY     45 years ago  . UPPER GASTROINTESTINAL ENDOSCOPY      Social History   Tobacco Use  . Smoking status: Never Smoker  . Smokeless tobacco: Never Used  Substance Use Topics  . Alcohol use: No    Alcohol/week: 0.0 standard drinks    Family History  Problem Relation Age of Onset  . Diabetes Mother   . Diabetes Father   . Heart disease Father   . Diabetes Sister   . Heart disease Son   . Colon cancer Neg Hx   . Colon polyps Neg Hx   . Rectal cancer Neg Hx   . Stomach cancer Neg Hx   . Esophageal cancer Neg Hx     Review of Systems  Constitutional: Negative for chills and fever.  Respiratory: Negative for cough and shortness of breath.   Cardiovascular: Negative for chest pain, palpitations and leg swelling.  Gastrointestinal: Negative for abdominal pain, blood in stool, constipation, diarrhea, melena, nausea and vomiting.  Genitourinary: Negative for dysuria and hematuria.  per hpi   OBJECTIVE:  Today's Vitals   08/23/20 0843  BP: 138/73  Pulse: 72  Resp: 15  Temp: 97.9 F (36.6 C)  TempSrc: Temporal  SpO2: 97%  Weight: 137 lb 3.2 oz (62.2 kg)  Height: '5\' 3"'  (1.6 m)   Body mass index is 24.3 kg/m.   Wt Readings from Last 3 Encounters:  08/23/20 137 lb 3.2 oz (62.2 kg)  06/20/20 134 lb (60.8 kg)  12/01/19 120 lb (54.4 kg)     Physical Exam Vitals and nursing note reviewed.  Constitutional:      Appearance: She is well-developed.  HENT:     Head: Normocephalic and atraumatic.     Mouth/Throat:     Pharynx: No  oropharyngeal exudate.  Eyes:     General: No scleral icterus.    Extraocular Movements: Extraocular movements intact.     Conjunctiva/sclera: Conjunctivae normal.     Pupils: Pupils are equal, round, and reactive to light.  Cardiovascular:     Rate and Rhythm: Normal rate and regular rhythm.     Heart sounds: Normal heart sounds. No murmur heard.  No friction rub. No gallop.   Pulmonary:     Effort: Pulmonary effort is normal.     Breath sounds: Normal breath sounds. No wheezing, rhonchi or rales.  Musculoskeletal:     Cervical back: Neck supple.     Right lower leg: No edema.     Left lower leg: No edema.  Skin:    General: Skin is warm and dry.  Neurological:     Mental Status: She is alert and oriented to person, place, and time.    Diabetic Foot Form - Detailed   Diabetic Foot Exam - detailed Visual Foot Exam completed.: Yes  Pulse Foot Exam completed.: Yes  Sensory Foot Exam Completed.: Yes Semmes-Weinstein Monofilament Test       No results found for this or any previous visit (from the past 24 hour(s)).  No results found.   ASSESSMENT and PLAN  1. Essential hypertension Controlled. Continue current regime.   2. Controlled type 2 diabetes mellitus with diabetic neuropathy, with long-term current use of insulin ([place]) Managed by endo. Neuropathy well controlled on gabapentin. Requesting notes/labs from dr Zigmund Daniel and dr Garnet Koyanagi.   3. Gastroparesis Asymptomatic at this time, on regular diet. Cont pantoprazole and pepcid for reflux/h/o recurrent ulcers  4. Presence of cardiac pacemaker managed by cards, recent OV  Other orders - insulin glargine (LANTUS SOLOSTAR) 100 UNIT/ML Solostar Pen; Inject 37 Units into the skin daily at 10 pm.  Return in about 6 months (around 02/20/2021).    Rutherford Guys, MD [facility] at [facility] [place], [place] [address] Ph.  [phone] Fax [phone]

## 2020-08-31 ENCOUNTER — Telehealth: Payer: Self-pay | Admitting: *Deleted

## 2020-08-31 NOTE — Telephone Encounter (Signed)
Schedule AWV after 9-28

## 2020-10-05 ENCOUNTER — Other Ambulatory Visit: Payer: Self-pay

## 2020-10-05 ENCOUNTER — Encounter: Payer: Self-pay | Admitting: Family Medicine

## 2020-10-05 ENCOUNTER — Ambulatory Visit (INDEPENDENT_AMBULATORY_CARE_PROVIDER_SITE_OTHER): Payer: Medicare Other | Admitting: Family Medicine

## 2020-10-05 ENCOUNTER — Telehealth (INDEPENDENT_AMBULATORY_CARE_PROVIDER_SITE_OTHER): Payer: Medicare Other | Admitting: Family Medicine

## 2020-10-05 DIAGNOSIS — R059 Cough, unspecified: Secondary | ICD-10-CM | POA: Diagnosis not present

## 2020-10-05 LAB — POCT INFLUENZA A/B
Influenza A, POC: NEGATIVE
Influenza B, POC: NEGATIVE

## 2020-10-05 NOTE — Patient Instructions (Addendum)
Encouraged to come to clinic to get tested for Flu and Covid Supportive Care encouraged Increase fluids as able To ED if any signs of respiratory distress  Viral Illness, Adult Viruses are tiny germs that can get into a person's body and cause illness. There are many different types of viruses, and they cause many types of illness. Viral illnesses can range from mild to severe. They can affect various parts of the body. Common illnesses that are caused by a virus include colds and the flu. Viral illnesses also include serious conditions such as HIV/AIDS (human immunodeficiency virus/acquired immunodeficiency syndrome). A few viruses have been linked to certain cancers. What are the causes? Many types of viruses can cause illness. Viruses invade cells in your body, multiply, and cause the infected cells to malfunction or die. When the cell dies, it releases more of the virus. When this happens, you develop symptoms of the illness, and the virus continues to spread to other cells. If the virus takes over the function of the cell, it can cause the cell to divide and grow out of control, as is the case when a virus causes cancer. Different viruses get into the body in different ways. You can get a virus by:  Swallowing food or water that is contaminated with the virus.  Breathing in droplets that have been coughed or sneezed into the air by an infected person.  Touching a surface that has been contaminated with the virus and then touching your eyes, nose, or mouth.  Being bitten by an insect or animal that carries the virus.  Having sexual contact with a person who is infected with the virus.  Being exposed to blood or fluids that contain the virus, either through an open cut or during a transfusion. If a virus enters your body, your body's defense system (immune system) will try to fight the virus. You may be at higher risk for a viral illness if your immune system is weak. What are the signs or  symptoms? Symptoms vary depending on the type of virus and the location of the cells that it invades. Common symptoms of the main types of viral illnesses include: Cold and flu viruses  Fever.  Headache.  Sore throat.  Muscle aches.  Nasal congestion.  Cough.   How is this treated? Viruses can be difficult to treat because they live within cells. Antibiotic medicines do not treat viruses because these drugs do not get inside cells. Treatment for a viral illness may include:  Resting and drinking plenty of fluids.  Medicines to relieve symptoms. These can include over-the-counter medicine for pain and fever, medicines for cough or congestion, and medicines to relieve diarrhea.  Antiviral medicines. These drugs are available only for certain types of viruses. They may help reduce flu symptoms if taken early. There are also many antiviral medicines for hepatitis and HIV/AIDS. Some viral illnesses can be prevented with vaccinations. A common example is the flu shot. Follow these instructions at home: Medicines   Take over-the-counter and prescription medicines only as told by your health care provider.  If you were prescribed an antiviral medicine, take it as told by your health care provider. Do not stop taking the medicine even if you start to feel better.  Be aware of when antibiotics are needed and when they are not needed. Antibiotics do not treat viruses. If your health care provider thinks that you may have a bacterial infection as well as a viral infection, you may get an  antibiotic. ? Do not ask for an antibiotic prescription if you have been diagnosed with a viral illness. That will not make your illness go away faster. ? Frequently taking antibiotics when they are not needed can lead to antibiotic resistance. When this develops, the medicine no longer works against the bacteria that it normally fights. General instructions  Drink enough fluids to keep your urine clear or  pale yellow.  Rest as much as possible.  Return to your normal activities as told by your health care provider. Ask your health care provider what activities are safe for you.  Keep all follow-up visits as told by your health care provider. This is important. How is this prevented? Take these actions to reduce your risk of viral infection:  Eat a healthy diet and get enough rest.  Wash your hands often with soap and water. This is especially important when you are in public places. If soap and water are not available, use hand sanitizer.  Avoid close contact with friends and family who have a viral illness.  If you travel to areas where viral gastrointestinal infection is common, avoid drinking water or eating raw food.  Keep your immunizations up to date. Get a flu shot every year as told by your health care provider.  Do not share toothbrushes, nail clippers, razors, or needles with other people.  Contact a health care provider if:  You have symptoms of a viral illness that do not go away.  Your symptoms come back after going away.  Your symptoms get worse. Get help right away if:  You have trouble breathing.  You have a severe headache or a stiff neck.  You have severe vomiting or abdominal pain. This information is not intended to replace advice given to you by your health care provider. Make sure you discuss any questions you have with your health care provider. Document Revised: 11/22/2017 Document Reviewed: 04/20/2016 Elsevier Patient Education  2020 [employer].

## 2020-10-05 NOTE — Progress Notes (Signed)
Virtual Visit Note  I connected with patient on 10/05/20 at 1550 by telephone after x2 video attempts and verified that I am speaking with the correct person using two identifiers. Judith [name] is currently located at home and no family members are currently with them during visit. The provider, Laurita Quint Amantha Sklar, FNP is located in their office at time of visit.  I discussed the limitations, risks, security and privacy concerns of performing an evaluation and management service by telephone and the availability of in person appointments. I also discussed with the patient that there may be a patient responsible charge related to this service. The patient expressed understanding and agreed to proceed.   I provided 20 minutes of non-face-to-face time during this encounter.   Chief Complaint  Patient presents with  . Cough    3 days - pt resides in a group community setting, she will have them check her temp and test her for covid as soon as possible   . throat pain   Cough Associated symptoms include myalgias and a sore throat. Pertinent negatives include no chest pain, chills, fever, headaches, rash, shortness of breath or wheezing.  Symptoms started Saturday but have gotten progressively worse She lives in an assisted living and has been quarantined to her room  Oxygen 95% on home readings "Throat feels raw" Rhinnorhea, achy and lack of appetite Denies Fever, nausea, vomiting, diarrhea  Immunized for COVID February/March   Allergies  Allergen Reactions  . Asa [Aspirin] Other (See Comments)    Causes ulcers   . Tylenol [Acetaminophen]     Intolerance due to liver damage  . Sulfa Antibiotics Rash    Prior to Admission medications   Medication Sig Start Date End Date Taking? Authorizing Provider  Biotin 1000 MCG tablet Take 1,000 mcg by mouth daily.   Yes [provider]  blood glucose meter kit and supplies KIT Dispense based on patient and insurance preference. Check  capillary glucose three times a day, before meals.  Dx E11.65, Z79.4 10/29/17  Yes Rutherford Guys, MD  carvedilol (COREG) 25 MG tablet Take 1 tablet (25 mg total) by mouth 2 (two) times daily with a meal. 01/22/19  Yes Rutherford Guys, MD  colchicine 0.6 MG tablet Take 0.6 mg by mouth daily.   Yes [provider]  diphenhydrAMINE (BENADRYL) 25 mg capsule Take 1 capsule (25 mg total) by mouth daily as needed for itching. 05/16/18  Yes Rutherford Guys, MD  DULoxetine (CYMBALTA) 60 MG capsule TAKE 1 CAPSULE (60 MG TOTAL) DAILY BY MOUTH. 04/28/18  Yes Rutherford Guys, MD  famotidine (PEPCID) 20 MG tablet Take 1 tablet (20 mg total) by mouth at bedtime. 06/11/19  Yes Rutherford Guys, MD  gabapentin (NEURONTIN) 100 MG capsule Take 1 capsule (100 mg) every morning and afternoon, and take 4 capsules (43m) at bedtime Patient taking differently: Take 100-400 mg by mouth 4 (four) times daily. 100 mg in the morning, 100 mg in the afternoon & 400 mg at bedtime 04/03/18  Yes SRutherford Guys MD  glucose blood (ONE TOUCH ULTRA TEST) test strip USE TO CHECK GLUCOSE LEVELS 3 TIMES A DAY, DX E11.9, Z79.4 11/30/17  Yes SRutherford Guys MD  HUMALOG KWIKPEN 100 UNIT/ML KwikPen CHECK [facility] AND INJECT PER [facility](0-80.0UNITS)(80-149,5U)(150-180,9U)(181-500,CALL MD) 09/16/19  Yes SRutherford Guys MD  insulin glargine (LANTUS SOLOSTAR) 100 UNIT/ML Solostar Pen Inject 37 Units into the skin daily at 10 pm. 08/23/20  Yes SPamella Pert  Lilia Argue, MD  insulin lispro (HUMALOG) 100 UNIT/ML injection Inject 1-10 Units into the skin 3 (three) times daily before meals. Sliding scale   Yes [provider]  Insulin Pen Needle (B-D ULTRAFINE III SHORT PEN) 31G X 8 MM MISC To use as directed with insulin, four times a day. Dx E11.9, Z79.4 11/08/17  Yes Rutherford Guys, MD  losartan (COZAAR) 100 MG tablet Take 100 mg by mouth daily.   Yes [provider]  Multiple Vitamin (MULTIVITAMIN) tablet Take 1  tablet daily by mouth.   Yes [provider]  pantoprazole (PROTONIX) 40 MG tablet TAKE 1 TABLET (40 MG TOTAL) DAILY BY MOUTH. 04/28/18  Yes Rutherford Guys, MD    Past Medical History:  Diagnosis Date  . Anemia   . Arthritis   . Biliary gastritis   . Blood transfusion without reported diagnosis    2007  . Cervical spine arthritis 04/05/2016  . Controlled type 2 diabetes mellitus with diabetic neuropathy, with long-term current use of insulin ([place])   . Depression   . Diabetes mellitus type 2 with complications ([place])   . Dilated bile duct   . Duodenal ulcer disease   . Encounter for long-term (current) use of insulin ([place])   . Gallstones 01/17/2018  . Gastric ulcer   . Gastritis and gastroduodenitis   . Gastroparesis    severe  . GERD (gastroesophageal reflux disease)   . Gout    tested for gout but was not  . Heart murmur   . Hyperlipidemia    in past not now  . Hypertension   . Hypokalemia, gastrointestinal losses    secondary to severe gastroparesis  . Long-term insulin use in type 2 diabetes ([place]) 04/05/2016  . Osteoporosis    Arli Bree had bone density test possible   . Peripheral neuropathy   . Presence of cardiac pacemaker   . S/P partial gastrectomy    due to severe gastric and gudoenal ulcers  . Seasonal allergies   . Sleep apnea    could not use CPAP  . UTI (urinary tract infection)     Past Surgical History:  Procedure Laterality Date  . COLONOSCOPY  02/11/2018   no polyps, + small internal hemorrhoids  . EUS N/A 12/12/2017   Procedure: UPPER ENDOSCOPIC ULTRASOUND (EUS) RADIAL;  Surgeon: [name], MD;  Location: [facility];  Service: Endoscopy;  Laterality: N/A;  . GASTRECTOMY    . JOINT REPLACEMENT    . pace maker    . PARTIAL GASTRECTOMY    . TONSILLECTOMY     45 years ago  . UPPER GASTROINTESTINAL ENDOSCOPY      Social History   Tobacco Use  . Smoking status: Never Smoker  . Smokeless tobacco: Never Used  Substance Use Topics  .  Alcohol use: No    Alcohol/week: 0.0 standard drinks    Family History  Problem Relation Age of Onset  . Diabetes Mother   . Diabetes Father   . Heart disease Father   . Diabetes Sister   . Heart disease Son   . Colon cancer Neg Hx   . Colon polyps Neg Hx   . Rectal cancer Neg Hx   . Stomach cancer Neg Hx   . Esophageal cancer Neg Hx     Review of Systems  Constitutional: Positive for malaise/fatigue. Negative for chills and fever.  HENT: Positive for sore throat. Negative for congestion and sinus pain.   Eyes: Negative for blurred vision  and double vision.  Respiratory: Positive for cough. Negative for shortness of breath and wheezing.   Cardiovascular: Negative for chest pain, palpitations and leg swelling.  Gastrointestinal: Negative for abdominal pain, diarrhea, nausea and vomiting.  Musculoskeletal: Positive for myalgias. Negative for falls.  Skin: Negative for rash.  Neurological: Negative for weakness and headaches.    Objective  Physical Exam Vitals as reported by the patient: SpO2: 95 HENT:     Head: Normocephalic and atraumatic.     Mouth/Throat:     Pharynx: No oropharyngeal exudate.  Pulmonary:     Effort: Pulmonary effort is normal.  Neurological:     Mental Status: She is alert and oriented to person, place, and time.     ASSESSMENT and PLAN Problem List Items Addressed This Visit    None    Visit Diagnoses    Cough    -  Primary   Relevant Orders   Novel Coronavirus, NAA ([facility])   Influenza A/B  Encouraged to come to clinic to get tested for Flu and Covid Supportive Care encouraged Increase fluids as able To ED if any signs of respiratory distress       Viral Illness, Adult Viruses are tiny germs that can get into a person's body and cause illness. There are many different types of viruses, and they cause many types of illness. Viral illnesses can range from mild to severe. They can affect various parts of the body. Common illnesses  that are caused by a virus include colds and the flu. Viral illnesses also include serious conditions such as HIV/AIDS (human immunodeficiency virus/acquired immunodeficiency syndrome). A few viruses have been linked to certain cancers. What are the causes? Many types of viruses can cause illness. Viruses invade cells in your body, multiply, and cause the infected cells to malfunction or die. When the cell dies, it releases more of the virus. When this happens, you develop symptoms of the illness, and the virus continues to spread to other cells. If the virus takes over the function of the cell, it can cause the cell to divide and grow out of control, as is the case when a virus causes cancer. Different viruses get into the body in different ways. You can get a virus by:  Swallowing food or water that is contaminated with the virus.  Breathing in droplets that have been coughed or sneezed into the air by an infected person.  Touching a surface that has been contaminated with the virus and then touching your eyes, nose, or mouth.  Being bitten by an insect or animal that carries the virus.  Having sexual contact with a person who is infected with the virus.  Being exposed to blood or fluids that contain the virus, either through an open cut or during a transfusion. If a virus enters your body, your body's defense system (immune system) will try to fight the virus. You may be at higher risk for a viral illness if your immune system is weak. What are the signs or symptoms? Symptoms vary depending on the type of virus and the location of the cells that it invades. Common symptoms of the main types of viral illnesses include: Cold and flu viruses  Fever.  Headache.  Sore throat.  Muscle aches.  Nasal congestion.  Cough.   How is this treated? Viruses can be difficult to treat because they live within cells. Antibiotic medicines do not treat viruses because these drugs do not get inside  cells. Treatment for a  viral illness may include:  Resting and drinking plenty of fluids.  Medicines to relieve symptoms. These can include over-the-counter medicine for pain and fever, medicines for cough or congestion, and medicines to relieve diarrhea.  Antiviral medicines. These drugs are available only for certain types of viruses. They may help reduce flu symptoms if taken early. There are also many antiviral medicines for hepatitis and HIV/AIDS. Some viral illnesses can be prevented with vaccinations. A common example is the flu shot. Follow these instructions at home: Medicines   Take over-the-counter and prescription medicines only as told by your health care provider.  If you were prescribed an antiviral medicine, take it as told by your health care provider. Do not stop taking the medicine even if you start to feel better.  Be aware of when antibiotics are needed and when they are not needed. Antibiotics do not treat viruses. If your health care provider thinks that you may have a bacterial infection as well as a viral infection, you may get an antibiotic. ? Do not ask for an antibiotic prescription if you have been diagnosed with a viral illness. That will not make your illness go away faster. ? Frequently taking antibiotics when they are not needed can lead to antibiotic resistance. When this develops, the medicine no longer works against the bacteria that it normally fights. General instructions  Drink enough fluids to keep your urine clear or pale yellow.  Rest as much as possible.  Return to your normal activities as told by your health care provider. Ask your health care provider what activities are safe for you.  Keep all follow-up visits as told by your health care provider. This is important. How is this prevented? Take these actions to reduce your risk of viral infection:  Eat a healthy diet and get enough rest.  Wash your hands often with soap and water. This is  especially important when you are in public places. If soap and water are not available, use hand sanitizer.  Avoid close contact with friends and family who have a viral illness.  If you travel to areas where viral gastrointestinal infection is common, avoid drinking water or eating raw food.  Keep your immunizations up to date. Get a flu shot every year as told by your health care provider.  Do not share toothbrushes, nail clippers, razors, or needles with other people.  Contact a health care provider if:  You have symptoms of a viral illness that do not go away.  Your symptoms come back after going away.  Your symptoms get worse. Get help right away if:  You have trouble breathing.  You have a severe headache or a stiff neck.  You have severe vomiting or abdominal pain. This information is not intended to replace advice given to you by your health care provider. Make sure you discuss any questions you have with your health care provider. Document Revised: 11/22/2017 Document Reviewed: 04/20/2016 Elsevier Patient Education  2020 [place]. Return in about 1 week (around 10/12/2020), or if symptoms worsen or fail to improve.    The above assessment and management plan was discussed with the patient. The patient verbalized understanding of and has agreed to the management plan. Patient is aware to call the clinic if symptoms persist or worsen. Patient is aware when to return to the clinic for a follow-up visit. Patient educated on when it is appropriate to go to the emergency department.     Huston Foley Paublo Warshawsky, FNP-BC [facility] [place], [place] [address] Ph.  [phone] Fax [phone]

## 2020-10-06 LAB — SARS-COV-2, NAA 2 DAY TAT

## 2020-10-06 LAB — NOVEL CORONAVIRUS, NAA: SARS-CoV-2, NAA: NOT DETECTED

## 2020-10-07 ENCOUNTER — Telehealth (INDEPENDENT_AMBULATORY_CARE_PROVIDER_SITE_OTHER): Payer: Medicare Other | Admitting: Registered Nurse

## 2020-10-07 ENCOUNTER — Other Ambulatory Visit: Payer: Self-pay

## 2020-10-07 DIAGNOSIS — J029 Acute pharyngitis, unspecified: Secondary | ICD-10-CM

## 2020-10-07 DIAGNOSIS — J069 Acute upper respiratory infection, unspecified: Secondary | ICD-10-CM | POA: Diagnosis not present

## 2020-10-07 DIAGNOSIS — B9689 Other specified bacterial agents as the cause of diseases classified elsewhere: Secondary | ICD-10-CM | POA: Diagnosis not present

## 2020-10-07 MED ORDER — BENZONATATE 100 MG PO CAPS: 100.0000 mg | ORAL_CAPSULE | Freq: Three times a day (TID) | ORAL | 0 refills | Status: DC | PRN

## 2020-10-07 MED ORDER — AMOXICILLIN-POT CLAVULANATE 875-125 MG PO TABS: 1.0000 | ORAL_TABLET | Freq: Two times a day (BID) | ORAL | 0 refills | Status: DC

## 2020-10-07 MED ORDER — CHLORASEPTIC WARM SORE THROAT 1.4 % MT LIQD: 1.0000 | OROMUCOSAL | 0 refills | Status: DC | PRN

## 2020-10-07 MED ORDER — GUAIFENESIN-DM 100-10 MG/5ML PO SYRP: 5.0000 mL | ORAL_SOLUTION | Freq: Every evening | ORAL | 0 refills | Status: DC | PRN

## 2020-10-07 NOTE — Patient Instructions (Signed)
° ° ° °  If you have lab work done today you will be contacted with your lab results within the next 2 weeks.  If you have not heard from us then please contact us. The fastest way to get your results is to register for My Chart. ° ° °IF you received an x-ray today, you will receive an invoice from [facility]. Please contact [facility] at [phone] with questions or concerns regarding your invoice.  ° °IF you received labwork today, you will receive an invoice from [facility]. Please contact [facility] at [phone] with questions or concerns regarding your invoice.  ° °Our billing staff will not be able to assist you with questions regarding bills from these companies. ° °You will be contacted with the lab results as soon as they are available. The fastest way to get your results is to activate your My Chart account. Instructions are located on the last page of this paperwork. If you have not heard from us regarding the results in 2 weeks, please contact this office. °  ° ° ° °

## 2020-10-07 NOTE — Progress Notes (Addendum)
Telemedicine Encounter- SOAP NOTE Established Patient  This telephone encounter was conducted with the patient's (or proxy's) verbal consent via audio telecommunications: yes  Patient was instructed to have this encounter in a suitably private space; and to only have persons present to whom they give permission to participate. In addition, patient identity was confirmed by use of name plus two identifiers (DOB and address).  I discussed the limitations, risks, security and privacy concerns of performing an evaluation and management service by telephone and the availability of in person appointments. I also discussed with the patient that there may be a patient responsible charge related to this service. The patient expressed understanding and agreed to proceed.  I spent a total of 12 minutes talking with the patient or their proxy.  Patient at home Provider in office  Chief Complaint  Patient presents with  . Follow-up    Patient states she has an body aches , sore throat, congestion,and upset stomach. Patient would like to get something Prescribed patient would have to have note faxed to ([place] is a 79 y.o. established patient. Telephone visit today for ongoing URI  HPI Was assessed by Huston Foley Just via telemed on Wednesday who suspected viral URI and ordered rapid flu and COVID testing to rule out. These came back negative  However, patient is having ongoing symptoms. Sore throat, pnd, rhinorrhea, nasal congestion, and sinus pressure. Notes that a nurse at her community examined her and said lungs CTA through all fields. Pt denies lightheadedness, dizziness, worsening shob, doe, palpitations, and diarrhea.  No improvement in symptoms, generally staying about the same.  Patient Active Problem List   Diagnosis Date Noted  . Multilevel degenerative disc disease 04/03/2018  . Acute blood loss anemia 01/17/2018  . Gallstones  01/17/2018  . Weight loss, unintentional 01/17/2018  . Dilated bile duct   . Elevated liver function tests   . Gastritis and gastroduodenitis   . S/P partial gastrectomy   . Peripheral neuropathy   . Hypertension   . Gastroparesis   . Duodenal ulcer disease   . Controlled type 2 diabetes mellitus with diabetic neuropathy, with long-term current use of insulin ([place])   . Depression   . Biliary gastritis   . Seasonal allergies   . Presence of cardiac pacemaker   . Gout   . Cervical spine arthritis 04/05/2016  . Long-term insulin use in type 2 diabetes ([place]) 04/05/2016    Past Medical History:  Diagnosis Date  . Anemia   . Arthritis   . Biliary gastritis   . Blood transfusion without reported diagnosis    2007  . Cervical spine arthritis 04/05/2016  . Controlled type 2 diabetes mellitus with diabetic neuropathy, with long-term current use of insulin ([place])   . Depression   . Diabetes mellitus type 2 with complications ([place])   . Dilated bile duct   . Duodenal ulcer disease   . Encounter for long-term (current) use of insulin ([place])   . Gallstones 01/17/2018  . Gastric ulcer   . Gastritis and gastroduodenitis   . Gastroparesis    severe  . GERD (gastroesophageal reflux disease)   . Gout    tested for gout but was not  . Heart murmur   . Hyperlipidemia    in past not now  . Hypertension   . Hypokalemia, gastrointestinal losses    secondary to severe gastroparesis  . Long-term insulin use in type 2  diabetes ([place]) 04/05/2016  . Osteoporosis    just had bone density test possible   . Peripheral neuropathy   . Presence of cardiac pacemaker   . S/P partial gastrectomy    due to severe gastric and gudoenal ulcers  . Seasonal allergies   . Sleep apnea    could not use CPAP  . UTI (urinary tract infection)     Current Outpatient Medications  Medication Sig Dispense Refill  . diphenhydrAMINE (BENADRYL) 25 mg capsule Take 1 capsule (25 mg total) by mouth daily as needed for  itching. 30 capsule 0  . Biotin 1000 MCG tablet Take 1,000 mcg by mouth daily. (Patient not taking: Reported on 10/07/2020)    . blood glucose meter kit and supplies KIT Dispense based on patient and insurance preference. Check capillary glucose three times a day, before meals.  Dx E11.65, Z79.4 (Patient not taking: Reported on 10/07/2020) 1 each 0  . carvedilol (COREG) 25 MG tablet Take 1 tablet (25 mg total) by mouth 2 (two) times daily with a meal. (Patient not taking: Reported on 10/07/2020) 180 tablet 0  . colchicine 0.6 MG tablet Take 0.6 mg by mouth daily. (Patient not taking: Reported on 10/07/2020)    . DULoxetine (CYMBALTA) 60 MG capsule TAKE 1 CAPSULE (60 MG TOTAL) DAILY BY MOUTH. (Patient not taking: Reported on 10/07/2020) 90 capsule 0  . famotidine (PEPCID) 20 MG tablet Take 1 tablet (20 mg total) by mouth at bedtime. (Patient not taking: Reported on 10/07/2020) 90 tablet 3  . gabapentin (NEURONTIN) 100 MG capsule Take 1 capsule (100 mg) every morning and afternoon, and take 4 capsules (427m) at bedtime (Patient not taking: Reported on 10/07/2020) 180 capsule 3  . glucose blood (ONE TOUCH ULTRA TEST) test strip USE TO CHECK GLUCOSE LEVELS 3 TIMES A DAY, DX E11.9, Z79.4 (Patient not taking: Reported on 10/07/2020) 100 each 11  . HUMALOG KWIKPEN 100 UNIT/ML KwikPen CHECK FSBS BEFORE EACH MEAL AND INJECT PER SSI(0-80.0UNITS)(80-149,5U)(150-180,9U)(181-500,CALL MD) (Patient not taking: Reported on 10/07/2020) 15 mL 0  . insulin glargine (LANTUS SOLOSTAR) 100 UNIT/ML Solostar Pen Inject 37 Units into the skin daily at 10 pm. (Patient not taking: Reported on 10/07/2020)    . insulin lispro (HUMALOG) 100 UNIT/ML injection Inject 1-10 Units into the skin 3 (three) times daily before meals. Sliding scale (Patient not taking: Reported on 10/07/2020)    . Insulin Pen Needle (B-D ULTRAFINE III SHORT PEN) 31G X 8 MM MISC To use as directed with insulin, four times a day. Dx E11.9, Z79.4 (Patient not  taking: Reported on 10/07/2020) 200 each 5  . losartan (COZAAR) 100 MG tablet Take 100 mg by mouth daily. (Patient not taking: Reported on 10/07/2020)    . Multiple Vitamin (MULTIVITAMIN) tablet Take 1 tablet daily by mouth. (Patient not taking: Reported on 10/07/2020)    . pantoprazole (PROTONIX) 40 MG tablet TAKE 1 TABLET (40 MG TOTAL) DAILY BY MOUTH. (Patient not taking: Reported on 10/07/2020) 90 tablet 0   No current facility-administered medications for this visit.    Allergies  Allergen Reactions  . Asa [Aspirin] Other (See Comments)    Causes ulcers   . Tylenol [Acetaminophen]     Intolerance due to liver damage  . Sulfa Antibiotics Rash    Social History   Socioeconomic History  . Marital status: Widowed    Spouse name: Not on file  . Number of children: 3  . Years of education: Not on file  . Highest education level:  High school graduate  Occupational History  . Not on file  Tobacco Use  . Smoking status: Never Smoker  . Smokeless tobacco: Never Used  Vaping Use  . Vaping Use: Never used  Substance and Sexual Activity  . Alcohol use: No    Alcohol/week: 0.0 standard drinks  . Drug use: No  . Sexual activity: Not Currently  Other Topics Concern  . Not on file  Social History Narrative   Used to live independently in [place]   However had significant issues with severe gastroparesis resulting in severe hypokalemia and general weakness and fall resulting in hospitalization and SNF rehab   Now (10/2017) living with her daughter in [place] until able to stabilize and regain independence.      May 2019   Patient living in assisted living, [place]   Social Determinants of Health   Financial Resource Strain:   . Difficulty of Paying Living Expenses: Not on file  Food Insecurity:   . Worried About Charity fundraiser in the Last Year: Not on file  . Ran Out of Food in the Last Year: Not on file  Transportation Needs:   . Lack of Transportation (Medical): Not on  file  . Lack of Transportation (Non-Medical): Not on file  Physical Activity:   . Days of Exercise per Week: Not on file  . Minutes of Exercise per Session: Not on file  Stress:   . Feeling of Stress : Not on file  Social Connections:   . Frequency of Communication with Friends and Family: Not on file  . Frequency of Social Gatherings with Friends and Family: Not on file  . Attends Religious Services: Not on file  . Active Member of Clubs or Organizations: Not on file  . Attends Archivist Meetings: Not on file  . Marital Status: Not on file  Intimate Partner Violence:   . Fear of Current or Ex-Partner: Not on file  . Emotionally Abused: Not on file  . Physically Abused: Not on file  . Sexually Abused: Not on file    ROS Per hpi   Objective   Vitals as reported by the patient: There were no vitals filed for this visit.  Judith [name] was seen today for follow-up.  Diagnoses and all orders for this visit:  Bacterial upper respiratory infection -     amoxicillin-clavulanate (AUGMENTIN) 875-125 MG tablet; Take 1 tablet by mouth 2 (two) times daily.  Sore throat -     amoxicillin-clavulanate (AUGMENTIN) 875-125 MG tablet; Take 1 tablet by mouth 2 (two) times daily. -     benzonatate (TESSALON) 100 MG capsule; Take 1 capsule (100 mg total) by mouth 3 (three) times daily as needed for cough. -     guaiFENesin-dextromethorphan (ROBITUSSIN DM) 100-10 MG/5ML syrup; Take 5 mLs by mouth at bedtime as needed for cough. -     phenol (CHLORASEPTIC WARM SORE THROAT) 1.4 % LIQD; Use as directed 1 spray in the mouth or throat as needed for throat irritation / pain.   PLAN  Augmentin PO bid for one week for suspected bacterial infection - either primary or secondary to viral infection.  Tessalon 151m PO tid PRN for cough  Guaifenesin-dextromethorphan 556mPO qhs PRN for cough and/or congestion  Phenol 1.4% liquid 1 spray in throat tid PRN for throat pain  Reviewed nonpharm and  supportive measures again - patient has been undertaking many of these methods under the advice of Kelsea Just, they are very helpful  Discussed  reasons to seek emergency care  Patient encouraged to call clinic with any questions, comments, or concerns.  I discussed the assessment and treatment plan with the patient. The patient was provided an opportunity to ask questions and all were answered. The patient agreed with the plan and demonstrated an understanding of the instructions.   The patient was advised to call back or seek an in-person evaluation if the symptoms worsen or if the condition fails to improve as anticipated.  I provided 12 minutes of non-face-to-face time during this encounter.  Maximiano Coss, NP  Primary Care at [facility]

## 2020-10-10 ENCOUNTER — Other Ambulatory Visit: Payer: Self-pay | Admitting: Family Medicine

## 2020-10-12 ENCOUNTER — Telehealth: Payer: Self-pay

## 2020-10-12 NOTE — Telephone Encounter (Signed)
I have called [employer] crew with [name] and spoke to [place], [employer]. We confirmed that the rx phenol written on 10/07/20 did not need any additional clarifications and they sent it out to pt.

## 2020-10-13 ENCOUNTER — Other Ambulatory Visit: Payer: Self-pay | Admitting: Cardiovascular Disease

## 2020-10-18 ENCOUNTER — Encounter (INDEPENDENT_AMBULATORY_CARE_PROVIDER_SITE_OTHER): Payer: Medicare Other | Admitting: Ophthalmology

## 2020-10-19 ENCOUNTER — Telehealth (INDEPENDENT_AMBULATORY_CARE_PROVIDER_SITE_OTHER): Payer: Medicare Other | Admitting: Emergency Medicine

## 2020-10-19 ENCOUNTER — Other Ambulatory Visit: Payer: Self-pay

## 2020-10-19 ENCOUNTER — Encounter: Payer: Self-pay | Admitting: Emergency Medicine

## 2020-10-19 DIAGNOSIS — L600 Ingrowing nail: Secondary | ICD-10-CM | POA: Diagnosis not present

## 2020-10-19 DIAGNOSIS — J069 Acute upper respiratory infection, unspecified: Secondary | ICD-10-CM

## 2020-10-19 DIAGNOSIS — R059 Cough, unspecified: Secondary | ICD-10-CM | POA: Diagnosis not present

## 2020-10-19 DIAGNOSIS — R6889 Other general symptoms and signs: Secondary | ICD-10-CM | POA: Diagnosis not present

## 2020-10-19 DIAGNOSIS — E1140 Type 2 diabetes mellitus with diabetic neuropathy, unspecified: Secondary | ICD-10-CM | POA: Diagnosis not present

## 2020-10-19 DIAGNOSIS — L605 Yellow nail syndrome: Secondary | ICD-10-CM | POA: Diagnosis not present

## 2020-10-19 DIAGNOSIS — R2681 Unsteadiness on feet: Secondary | ICD-10-CM | POA: Diagnosis not present

## 2020-10-19 DIAGNOSIS — L851 Acquired keratosis [keratoderma] palmaris et plantaris: Secondary | ICD-10-CM | POA: Diagnosis not present

## 2020-10-19 DIAGNOSIS — M79673 Pain in unspecified foot: Secondary | ICD-10-CM | POA: Diagnosis not present

## 2020-10-19 DIAGNOSIS — B351 Tinea unguium: Secondary | ICD-10-CM | POA: Diagnosis not present

## 2020-10-19 NOTE — Progress Notes (Signed)
Telemedicine Encounter- SOAP NOTE Established Patient  This telephone encounter was conducted with the patient's (or proxy's) verbal consent via audio telecommunications: yes/no: Yes Patient was instructed to have this encounter in a suitably private space; and to only have persons present to whom they give permission to participate. In addition, patient identity was confirmed by use of name plus two identifiers (DOB and address).  I discussed the limitations, risks, security and privacy concerns of performing an evaluation and management service by telephone and the availability of in person appointments. I also discussed with the patient that there may be a patient responsible charge related to this service. The patient expressed understanding and agreed to proceed.  I spent a total of TIME; 0 MIN TO 60 MIN: 20 minutes talking with the patient or their proxy.  Chief Complaint  Patient presents with  . Sinusitis    going on the last couple of weeks. Worse at night   . Itchy Eye  . Nasal Congestion  . Sore Throat  . Cough    Subjective   Judith [name] is a 79 y.o. female established patient. Telephone visit today complaining of persistent flulike symptoms that started 2 weeks ago.  Slowly getting better.  Still has sore throat and cough with eye itching and decreased appetite.  No other significant symptoms. Denies nausea or vomiting.  Able to eat and drink.  Denies abdominal pain or diarrhea.  Denies difficulty breathing or chest pain.  Just finished course of Augmentin for 10 days.  No other complaints or medical concerns today.  HPI   Patient Active Problem List   Diagnosis Date Noted  . Multilevel degenerative disc disease 04/03/2018  . Acute blood loss anemia 01/17/2018  . Gallstones 01/17/2018  . Weight loss, unintentional 01/17/2018  . Dilated bile duct   . Elevated liver function tests   . Gastritis and gastroduodenitis   . S/P partial gastrectomy   . Peripheral  neuropathy   . Hypertension   . Gastroparesis   . Duodenal ulcer disease   . Controlled type 2 diabetes mellitus with diabetic neuropathy, with long-term current use of insulin ([place])   . Depression   . Biliary gastritis   . Seasonal allergies   . Presence of cardiac pacemaker   . Gout   . Cervical spine arthritis 04/05/2016  . Long-term insulin use in type 2 diabetes ([place]) 04/05/2016    Past Medical History:  Diagnosis Date  . Anemia   . Arthritis   . Biliary gastritis   . Blood transfusion without reported diagnosis    2007  . Cervical spine arthritis 04/05/2016  . Controlled type 2 diabetes mellitus with diabetic neuropathy, with long-term current use of insulin ([place])   . Depression   . Diabetes mellitus type 2 with complications ([place])   . Dilated bile duct   . Duodenal ulcer disease   . Encounter for long-term (current) use of insulin ([place])   . Gallstones 01/17/2018  . Gastric ulcer   . Gastritis and gastroduodenitis   . Gastroparesis    severe  . GERD (gastroesophageal reflux disease)   . Gout    tested for gout but was not  . Heart murmur   . Hyperlipidemia    in past not now  . Hypertension   . Hypokalemia, gastrointestinal losses    secondary to severe gastroparesis  . Long-term insulin use in type 2 diabetes ([place]) 04/05/2016  . Osteoporosis    just had bone density test possible   .  Peripheral neuropathy   . Presence of cardiac pacemaker   . S/P partial gastrectomy    due to severe gastric and gudoenal ulcers  . Seasonal allergies   . Sleep apnea    could not use CPAP  . UTI (urinary tract infection)     Current Outpatient Medications  Medication Sig Dispense Refill  . benzonatate (TESSALON) 100 MG capsule Take 1 capsule (100 mg total) by mouth 3 (three) times daily as needed for cough. 20 capsule 0  . Biotin 1000 MCG tablet Take 1,000 mcg by mouth daily.     . carvedilol (COREG) 25 MG tablet Take 1 tablet (25 mg total) by mouth 2 (two) times daily with  a meal. 180 tablet 0  . colchicine 0.6 MG tablet Take 0.6 mg by mouth daily.     . DULoxetine (CYMBALTA) 60 MG capsule TAKE 1 CAPSULE (60 MG TOTAL) DAILY BY MOUTH. 90 capsule 0  . famotidine (PEPCID) 20 MG tablet Take 1 tablet (20 mg total) by mouth at bedtime. 90 tablet 3  . gabapentin (NEURONTIN) 100 MG capsule Take 1 capsule (100 mg) every morning and afternoon, and take 4 capsules (444m) at bedtime 180 capsule 3  . glucose blood (ONE TOUCH ULTRA TEST) test strip USE TO CHECK GLUCOSE LEVELS 3 TIMES A DAY, DX E11.9, Z79.4 100 each 11  . guaiFENesin-dextromethorphan (ROBITUSSIN DM) 100-10 MG/5ML syrup Take 5 mLs by mouth at bedtime as needed for cough. 236 mL 0  . HUMALOG KWIKPEN 100 UNIT/ML KwikPen CHECK FSBS BEFORE EACH MEAL AND INJECT PER SSI(0-80.0UNITS)(80-149,5U)(150-180,9U)(181-500,CALL MD) 15 mL 0  . insulin glargine (LANTUS SOLOSTAR) 100 UNIT/ML Solostar Pen Inject 37 Units into the skin daily at 10 pm.    . insulin lispro (HUMALOG) 100 UNIT/ML injection Inject 1-10 Units into the skin 3 (three) times daily before meals. Sliding scale     . Insulin Pen Needle (B-D ULTRAFINE III SHORT PEN) 31G X 8 MM MISC To use as directed with insulin, four times a day. Dx E11.9, Z79.4 200 each 5  . losartan (COZAAR) 100 MG tablet Take 100 mg by mouth daily.     . Multiple Vitamin (MULTIVITAMIN) tablet Take 1 tablet by mouth daily.     . pantoprazole (PROTONIX) 40 MG tablet TAKE 1 TABLET (40 MG TOTAL) DAILY BY MOUTH. 90 tablet 0  . phenol (CHLORASEPTIC WARM SORE THROAT) 1.4 % LIQD Use as directed 1 spray in the mouth or throat as needed for throat irritation / pain. 118 mL 0  . blood glucose meter kit and supplies KIT Dispense based on patient and insurance preference. Check capillary glucose three times a day, before meals.  Dx E11.65, Z79.4 (Patient not taking: Reported on 10/07/2020) 1 each 0   No current facility-administered medications for this visit.    Allergies  Allergen Reactions  . Asa  [Aspirin] Other (See Comments)    Causes ulcers   . Tylenol [Acetaminophen]     Intolerance due to liver damage  . Sulfa Antibiotics Rash    Social History   Socioeconomic History  . Marital status: Widowed    Spouse name: Not on file  . Number of children: 3  . Years of education: Not on file  . Highest education level: High school graduate  Occupational History  . Not on file  Tobacco Use  . Smoking status: Never Smoker  . Smokeless tobacco: Never Used  Vaping Use  . Vaping Use: Never used  Substance and Sexual Activity  .  Alcohol use: No    Alcohol/week: 0.0 standard drinks  . Drug use: No  . Sexual activity: Not Currently  Other Topics Concern  . Not on file  Social History Narrative   Used to live independently in [place]   However had significant issues with severe gastroparesis resulting in severe hypokalemia and general weakness and fall resulting in hospitalization and SNF rehab   Now (10/2017) living with her daughter in [place] until able to stabilize and regain independence.      May 2019   Patient living in assisted living, [place]   Social Determinants of Health   Financial Resource Strain:   . Difficulty of Paying Living Expenses: Not on file  Food Insecurity:   . Worried About Charity fundraiser in the Last Year: Not on file  . Ran Out of Food in the Last Year: Not on file  Transportation Needs:   . Lack of Transportation (Medical): Not on file  . Lack of Transportation (Non-Medical): Not on file  Physical Activity:   . Days of Exercise per Week: Not on file  . Minutes of Exercise per Session: Not on file  Stress:   . Feeling of Stress : Not on file  Social Connections:   . Frequency of Communication with Friends and Family: Not on file  . Frequency of Social Gatherings with Friends and Family: Not on file  . Attends Religious Services: Not on file  . Active Member of Clubs or Organizations: Not on file  . Attends Archivist Meetings:  Not on file  . Marital Status: Not on file  Intimate Partner Violence:   . Fear of Current or Ex-Partner: Not on file  . Emotionally Abused: Not on file  . Physically Abused: Not on file  . Sexually Abused: Not on file    Review of Systems  Constitutional: Negative for chills and fever.  HENT: Positive for congestion and sore throat.   Respiratory: Positive for cough.   Cardiovascular: Negative for chest pain and palpitations.  Gastrointestinal: Negative for abdominal pain, diarrhea, nausea and vomiting.  Genitourinary: Negative.  Negative for dysuria and hematuria.  Musculoskeletal: Negative.  Negative for back pain, myalgias and neck pain.  Skin: Negative.  Negative for rash.  Neurological: Negative for dizziness and headaches.  All other systems reviewed and are negative.   Objective  Alert and oriented x3 in no apparent respiratory distress Vitals as reported by the patient: There were no vitals filed for this visit.  There are no diagnoses linked to this encounter. Annisha was seen today for sinusitis, itchy eye, nasal congestion, sore throat and cough.  Diagnoses and all orders for this visit:  Acute upper respiratory infection  Cough  Flu-like symptoms  Clinically stable.  No red flag signs or symptoms.  No medical concerns identified during this visit.  Continue present medications.  Symptomatic treatment.  Advised to contact the office if no better or worse during the next several days.   I discussed the assessment and treatment plan with the patient. The patient was provided an opportunity to ask questions and all were answered. The patient agreed with the plan and demonstrated an understanding of the instructions.   The patient was advised to call back or seek an in-person evaluation if the symptoms worsen or if the condition fails to improve as anticipated.  I provided 20 minutes of non-face-to-face time during this encounter.  Horald Pollen,  MD  Primary Care at [facility]

## 2020-10-19 NOTE — Patient Instructions (Signed)
° ° ° °  If you have lab work done today you will be contacted with your lab results within the next 2 weeks.  If you have not heard from us then please contact us. The fastest way to get your results is to register for My Chart. ° ° °IF you received an x-ray today, you will receive an invoice from [facility]. Please contact  Radiology at [phone] with questions or concerns regarding your invoice.  ° °IF you received labwork today, you will receive an invoice from [facility]. Please contact [facility] at [phone] with questions or concerns regarding your invoice.  ° °Our billing staff will not be able to assist you with questions regarding bills from these companies. ° °You will be contacted with the lab results as soon as they are available. The fastest way to get your results is to activate your My Chart account. Instructions are located on the last page of this paperwork. If you have not heard from us regarding the results in 2 weeks, please contact this office. °  ° ° ° °

## 2020-11-01 DIAGNOSIS — Z23 Encounter for immunization: Secondary | ICD-10-CM | POA: Diagnosis not present

## 2020-11-10 DIAGNOSIS — K3184 Gastroparesis: Secondary | ICD-10-CM | POA: Diagnosis not present

## 2020-11-10 DIAGNOSIS — E119 Type 2 diabetes mellitus without complications: Secondary | ICD-10-CM | POA: Diagnosis not present

## 2020-11-10 DIAGNOSIS — Z6821 Body mass index (BMI) 21.0-21.9, adult: Secondary | ICD-10-CM | POA: Diagnosis not present

## 2020-11-10 DIAGNOSIS — E0865 Diabetes mellitus due to underlying condition with hyperglycemia: Secondary | ICD-10-CM | POA: Diagnosis not present

## 2020-11-10 DIAGNOSIS — Z23 Encounter for immunization: Secondary | ICD-10-CM | POA: Diagnosis not present

## 2020-11-10 DIAGNOSIS — R748 Abnormal levels of other serum enzymes: Secondary | ICD-10-CM | POA: Diagnosis not present

## 2020-11-10 DIAGNOSIS — E0841 Diabetes mellitus due to underlying condition with diabetic mononeuropathy: Secondary | ICD-10-CM | POA: Diagnosis not present

## 2020-11-15 ENCOUNTER — Ambulatory Visit (INDEPENDENT_AMBULATORY_CARE_PROVIDER_SITE_OTHER): Payer: Medicare Other | Admitting: Family Medicine

## 2020-11-15 ENCOUNTER — Encounter: Payer: Self-pay | Admitting: Family Medicine

## 2020-11-15 ENCOUNTER — Other Ambulatory Visit: Payer: Self-pay

## 2020-11-15 VITALS — BP 176/87 | HR 73 | Temp 97.9°F | Ht 63.0 in | Wt 135.0 lb

## 2020-11-15 DIAGNOSIS — M4692 Unspecified inflammatory spondylopathy, cervical region: Secondary | ICD-10-CM | POA: Diagnosis not present

## 2020-11-15 DIAGNOSIS — I10 Essential (primary) hypertension: Secondary | ICD-10-CM | POA: Diagnosis not present

## 2020-11-15 DIAGNOSIS — K3184 Gastroparesis: Secondary | ICD-10-CM | POA: Diagnosis not present

## 2020-11-15 DIAGNOSIS — Z794 Long term (current) use of insulin: Secondary | ICD-10-CM | POA: Diagnosis not present

## 2020-11-15 DIAGNOSIS — H6123 Impacted cerumen, bilateral: Secondary | ICD-10-CM

## 2020-11-15 DIAGNOSIS — R531 Weakness: Secondary | ICD-10-CM | POA: Diagnosis not present

## 2020-11-15 DIAGNOSIS — E1140 Type 2 diabetes mellitus with diabetic neuropathy, unspecified: Secondary | ICD-10-CM | POA: Diagnosis not present

## 2020-11-15 DIAGNOSIS — Z13220 Encounter for screening for lipoid disorders: Secondary | ICD-10-CM | POA: Diagnosis not present

## 2020-11-15 MED ORDER — ROSUVASTATIN CALCIUM 10 MG PO TABS: 10.0000 mg | ORAL_TABLET | Freq: Every day | ORAL | 3 refills | Status: DC

## 2020-11-15 MED ORDER — DEBROX 6.5 % OT SOLN: 10.0000 [drp] | Freq: Two times a day (BID) | OTIC | 0 refills | Status: AC

## 2020-11-15 MED ORDER — HYDROCHLOROTHIAZIDE 12.5 MG PO TABS: 12.5000 mg | ORAL_TABLET | Freq: Every day | ORAL | 3 refills | Status: DC

## 2020-11-15 NOTE — Progress Notes (Signed)
11/23/202111:14 AM  Judith [name] 09/18/1941, 79 y.o., female [id number]  Chief Complaint  Patient presents with  . transfer of care    [facility] form completion  . check lipids    HPI:   Patient is a 79 y.o. female with past medical history significant for DM2 insulin dependent, HTN, pacemaker, gastroparesis, polyarthritis, gastric ulcers, depressionwho presents today for routine follow up  Had a viral illness: sore throat, achy Telemed visit 10/15 Took antibiotics, but didn't help  Lives at [place], 2.5 years 23 from [facility] frequently at home Enjoys [place], no activities during pandemic   Essential hypertension Carvedilol 77m Losartan 1075m BP Readings from Last 3 Encounters:  11/15/20 (!) 176/87  08/23/20 138/73  06/20/20 (!) 152/80     Controlled type 2 diabetes mellitus with diabetic neuropathy, with long-term current use of insulin (HCMontaukManaged by endo. Neuropathy well controlled on gabapentin. Requesting notes/labs from dr maZigmund Danielnd dr avGarnet Koyanagi  insulin glargine (LANTUS SOLOSTAR) 100 UNIT/ML Solostar Pen; Inject 28 Units into the skin daily at 10 pm Decreased due to hypoglycemia Had previously been on a statin, no adverse reactions  Gastroparesis Asymptomatic at this time, on regular diet. Cont pantoprazole and pepcid for reflux/h/o recurrent ulcers  Presence of cardiac pacemaker managed by cards, She is 100% paced   Patient Care Team: Eduarda Scrivens, KeLaurita QuintFNP as PCP - General (Family Medicine) Croitoru, MiDani GobbleMD as PCP - Cardiology (Cardiology) FeEldridge AbrahamsMD as Referring Physician (Surgical Oncology) Pyrtle, JaLajuan LinesMD as Consulting Physician (Gastroenterology) AvMadelin RearMD as Referring Physician (Endocrinology) CrSanda KleinMD as Consulting Physician (Cardiology)    [facility] 11/15/2020 10/19/2020 10/07/2020  Decreased Interest 0 0 0  Down, Depressed, Hopeless 0 0 0   PHQ - 2 Score 0 0 0  Altered sleeping - - -  Tired, decreased energy - - -  Change in appetite - - -  Feeling bad or failure about yourself  - - -  Trouble concentrating - - -  Moving slowly or fidgety/restless - - -  Suicidal thoughts - - -  PHQ-9 Score - - -  Difficult doing work/chores - - -    Fall Risk  11/15/2020 10/19/2020 10/07/2020 10/05/2020 08/23/2020  Falls in the past year? 0 0 0 0 0  Number falls in past yr: 0 0 0 0 0  Injury with Fall? 0 0 0 0 0  Comment - - - - -  Risk Factor Category  - - - - -  Risk for fall due to : - - - - No Fall Risks  Follow up Falls evaluation completed Falls evaluation completed Falls evaluation completed Falls evaluation completed Falls evaluation completed     Allergies  Allergen Reactions  . Asa [Aspirin] Other (See Comments)    Causes ulcers   . Tylenol [Acetaminophen]     Intolerance due to liver damage  . Sulfa Antibiotics Rash    Prior to Admission medications   Medication Sig Start Date End Date Taking? Authorizing Provider  Biotin 1000 MCG tablet Take 1,000 mcg by mouth daily.    Yes [provider]  blood glucose meter kit and supplies KIT Dispense based on patient and insurance preference. Check capillary glucose three times a day, before meals.  Dx E11.65, Z79.4 10/29/17  Yes SaRutherford GuysMD  carvedilol (COREG) 25 MG tablet Take 1 tablet (25 mg total) by mouth 2 (two) times daily with a meal. 01/22/19  Yes Rutherford Guys, MD  colchicine 0.6 MG tablet Take 0.6 mg by mouth daily.    Yes [provider]  DULoxetine (CYMBALTA) 60 MG capsule TAKE 1 CAPSULE (60 MG TOTAL) DAILY BY MOUTH. 04/28/18  Yes Rutherford Guys, MD  famotidine (PEPCID) 20 MG tablet Take 1 tablet (20 mg total) by mouth at bedtime. 06/11/19  Yes Rutherford Guys, MD  gabapentin (NEURONTIN) 100 MG capsule Take 1 capsule (100 mg) every morning and afternoon, and take 4 capsules (460m) at bedtime 04/03/18  Yes SRutherford Guys MD  glucose  blood (ONE TOUCH ULTRA TEST) test strip USE TO CHECK GLUCOSE LEVELS 3 TIMES A DAY, DX E11.9, Z79.4 11/30/17  Yes SRutherford Guys MD  HUMALOG KWIKPEN 100 UNIT/ML KwikPen CHECK FSBS BEFORE ETexas Precision Surgery Center LLCMEAL AND INJECT PER SSI(0-80.0UNITS)(80-149,5U)(150-180,9U)(181-500,CALL MD) 09/16/19  Yes SRutherford Guys MD  insulin glargine (LANTUS SOLOSTAR) 100 UNIT/ML Solostar Pen Inject 37 Units into the skin daily at 10 pm. 08/23/20  Yes SRutherford Guys MD  insulin lispro (HUMALOG) 100 UNIT/ML injection Inject 1-10 Units into the skin 3 (three) times daily before meals. Sliding scale    Yes [provider]  Insulin Pen Needle (B-D ULTRAFINE III SHORT PEN) 31G X 8 MM MISC To use as directed with insulin, four times a day. Dx E11.9, Z79.4 11/08/17  Yes SRutherford Guys MD  losartan (COZAAR) 100 MG tablet Take 100 mg by mouth daily.    Yes [provider]  Multiple Vitamin (MULTIVITAMIN) tablet Take 1 tablet by mouth daily.    Yes [provider]  pantoprazole (PROTONIX) 40 MG tablet TAKE 1 TABLET (40 MG TOTAL) DAILY BY MOUTH. 04/28/18  Yes SRutherford Guys MD    Past Medical History:  Diagnosis Date  . Anemia   . Arthritis   . Biliary gastritis   . Blood transfusion without reported diagnosis    2007  . Cervical spine arthritis 04/05/2016  . Controlled type 2 diabetes mellitus with diabetic neuropathy, with long-term current use of insulin (HFair[place]   . Depression   . Diabetes mellitus type 2 with complications (HLaGr[place]   . Dilated bile duct   . Duodenal ulcer disease   . Encounter for long-term (current) use of insulin (HCl[place]   . Gallstones 01/17/2018  . Gastric ulcer   . Gastritis and gastroduodenitis   . Gastroparesis    severe  . GERD (gastroesophageal reflux disease)   . Gout    tested for gout but was not  . Heart murmur   . Hyperlipidemia    in past not now  . Hypertension   . Hypokalemia, gastrointestinal losses    secondary to severe gastroparesis  . Long-term insulin  use in type 2 diabetes (HSeama 04/05/2016  . Osteoporosis    Falana Clagg had bone density test possible   . Peripheral neuropathy   . Presence of cardiac pacemaker   . S/P partial gastrectomy    due to severe gastric and gudoenal ulcers  . Seasonal allergies   . Sleep apnea    could not use CPAP  . UTI (urinary tract infection)     Past Surgical History:  Procedure Laterality Date  . COLONOSCOPY  02/11/2018   no polyps, + small internal hemorrhoids  . EUS N/A 12/12/2017   Procedure: UPPER ENDOSCOPIC ULTRASOUND (EUS) RADIAL;  Surgeon: JMilus Banister MD;  Location: [facility] ENDOSCOPY;  Service: Endoscopy;  Laterality: N/A;  . GASTRECTOMY    . JOINT REPLACEMENT    .  pace Agricultural engineer    . PARTIAL GASTRECTOMY    . TONSILLECTOMY     45 years ago  . UPPER GASTROINTESTINAL ENDOSCOPY      Social History   Tobacco Use  . Smoking status: Never Smoker  . Smokeless tobacco: Never Used  Substance Use Topics  . Alcohol use: No    Alcohol/week: 0.0 standard drinks    Family History  Problem Relation Age of Onset  . Diabetes Mother   . Diabetes Father   . Heart disease Father   . Diabetes Sister   . Heart disease Son   . Colon cancer Neg Hx   . Colon polyps Neg Hx   . Rectal cancer Neg Hx   . Stomach cancer Neg Hx   . Esophageal cancer Neg Hx     Review of Systems  Constitutional: Negative for chills, fever and malaise/fatigue.  Eyes: Negative for blurred vision and double vision.  Respiratory: Negative for cough, shortness of breath and wheezing.   Cardiovascular: Negative for chest pain, palpitations and leg swelling.  Gastrointestinal: Positive for heartburn. Negative for abdominal pain, blood in stool, constipation, diarrhea, nausea and vomiting.  Genitourinary: Negative for dysuria, frequency and hematuria.  Musculoskeletal: Negative for back pain and joint pain.  Skin: Negative for rash.  Neurological: Negative for dizziness, weakness and headaches.     OBJECTIVE:  Today's Vitals    11/15/20 1003  BP: (!) 176/87  Pulse: 73  Temp: 97.9 F (36.6 C)  SpO2: 97%  Weight: 135 lb (61.2 kg)  Height: '5\' 3"'  (1.6 m)   Body mass index is 23.91 kg/m.   Physical Exam Constitutional:      General: She is not in acute distress.    Appearance: Normal appearance. She is not ill-appearing.  HENT:     Head: Normocephalic.     Right Ear: There is impacted cerumen.     Left Ear: There is impacted cerumen.  Cardiovascular:     Rate and Rhythm: Normal rate and regular rhythm.     Pulses: Normal pulses.     Heart sounds: Normal heart sounds. No murmur heard.  No friction rub. No gallop.   Pulmonary:     Effort: Pulmonary effort is normal. No respiratory distress.     Breath sounds: Normal breath sounds. No stridor. No wheezing, rhonchi or rales.  Abdominal:     General: Bowel sounds are normal.     Palpations: Abdomen is soft.     Tenderness: There is no abdominal tenderness.  Musculoskeletal:     Right lower leg: No edema.     Left lower leg: No edema.  Skin:    General: Skin is warm and dry.  Neurological:     Mental Status: She is alert and oriented to person, place, and time.  Psychiatric:        Mood and Affect: Mood normal.        Behavior: Behavior normal.   Right ear: cerumen remains present, TM intact Left ear: cerumen remains present, unable to visualize TM  No results found for this or any previous visit (from the past 24 hour(s)).  No results found.   ASSESSMENT and PLAN  Problem List Items Addressed This Visit      Digestive   Gastroparesis     Endocrine   Controlled type 2 diabetes mellitus with diabetic neuropathy, with long-term current use of insulin (HCC)   Relevant Medications   rosuvastatin (CRESTOR) 10 MG tablet   Other Relevant Orders  Hemoglobin A1c   CMP14+EGFR     Musculoskeletal and Integument   Unspecified inflammatory spondylopathy, cervical region [facility])    Other Visit Diagnoses    Screening for cholesterol level    -   Primary   Relevant Orders   Lipid panel   General weakness       Relevant Orders   CBC   TSH   Essential hypertension       Relevant Medications   hydrochlorothiazide (HYDRODIURIL) 12.5 MG tablet   rosuvastatin (CRESTOR) 10 MG tablet  BP not at goal< 140/90 At max doses of Losartan and Coreg Added HCTZ Encouraged to take BP daily   Bilateral impacted cerumen       Relevant Orders   Ear Lavage Debrox 10 drops per ear X4 days     Needs 2nd Shingrix at local pharmacy  Return in about 3 months (around 02/15/2021).    Huston Foley Glorian Mcdonell, FNP-BC Primary [facility] at [place], [place] [address] Ph.  [phone] Fax [phone]

## 2020-11-15 NOTE — Patient Instructions (Addendum)
Get your second shingrix at your local pharmacy   Health Maintenance After Age 80 After age 29, you are at a higher risk for certain long-term diseases and infections as well as injuries from falls. Falls are a major cause of broken bones and head injuries in people who are older than age 47. Getting regular preventive care can help to keep you healthy and well. Preventive care includes getting regular testing and making lifestyle changes as recommended by your health care provider. Talk with your health care provider about:  Which screenings and tests you should have. A screening is a test that checks for a disease when you have no symptoms.  A diet and exercise plan that is right for you. What should I know about screenings and tests to prevent falls? Screening and testing are the best ways to find a health problem early. Early diagnosis and treatment give you the best chance of managing medical conditions that are common after age 49. Certain conditions and lifestyle choices may make you more likely to have a fall. Your health care provider may recommend:  Regular vision checks. Poor vision and conditions such as cataracts can make you more likely to have a fall. If you wear glasses, make sure to get your prescription updated if your vision changes.  Medicine review. Work with your health care provider to regularly review all of the medicines you are taking, including over-the-counter medicines. Ask your health care provider about any side effects that may make you more likely to have a fall. Tell your health care provider if any medicines that you take make you feel dizzy or sleepy.  Osteoporosis screening. Osteoporosis is a condition that causes the bones to get weaker. This can make the bones weak and cause them to break more easily.  Blood pressure screening. Blood pressure changes and medicines to control blood pressure can make you feel dizzy.  Strength and balance checks. Your health  care provider may recommend certain tests to check your strength and balance while standing, walking, or changing positions.  Foot health exam. Foot pain and numbness, as well as not wearing proper footwear, can make you more likely to have a fall.  Depression screening. You may be more likely to have a fall if you have a fear of falling, feel emotionally low, or feel unable to do activities that you used to do.  Alcohol use screening. Using too much alcohol can affect your balance and may make you more likely to have a fall. What actions can I take to lower my risk of falls? General instructions  Talk with your health care provider about your risks for falling. Tell your health care provider if: ? You fall. Be sure to tell your health care provider about all falls, even ones that seem minor. ? You feel dizzy, sleepy, or off-balance.  Take over-the-counter and prescription medicines only as told by your health care provider. These include any supplements.  Eat a healthy diet and maintain a healthy weight. A healthy diet includes low-fat dairy products, low-fat (lean) meats, and fiber from whole grains, beans, and lots of fruits and vegetables. Home safety  Remove any tripping hazards, such as rugs, cords, and clutter.  Install safety equipment such as grab bars in bathrooms and safety rails on stairs.  Keep rooms and walkways well-lit. Activity   Follow a regular exercise program to stay fit. This will help you maintain your balance. Ask your health care provider what types of exercise are  appropriate for you.  If you need a cane or walker, use it as recommended by your health care provider.  Wear supportive shoes that have nonskid soles. Lifestyle  Do not drink alcohol if your health care provider tells you not to drink.  If you drink alcohol, limit how much you have: ? 0-1 drink a day for women. ? 0-2 drinks a day for men.  Be aware of how much alcohol is in your drink. In  the [place] one drink equals one typical bottle of beer (12 oz), one-half glass of wine (5 oz), or one shot of hard liquor (1 oz).  Do not use any products that contain nicotine or tobacco, such as cigarettes and e-cigarettes. If you need help quitting, ask your health care provider. Summary  Having a healthy lifestyle and getting preventive care can help to protect your health and wellness after age 22.  Screening and testing are the best way to find a health problem early and help you avoid having a fall. Early diagnosis and treatment give you the best chance for managing medical conditions that are more common for people who are older than age 53.  Falls are a major cause of broken bones and head injuries in people who are older than age 44. Take precautions to prevent a fall at home.  Work with your health care provider to learn what changes you can make to improve your health and wellness and to prevent falls. This information is not intended to replace advice given to you by your health care provider. Make sure you discuss any questions you have with your health care provider. Document Revised: 04/02/2019 Document Reviewed: 10/23/2017 Elsevier Patient Education  [employer].   If you have lab work done today you will be contacted with your lab results within the next 2 weeks.  If you have not heard from [place] then please contact us. The fastest way to get your results is to register for My Chart.   IF you received an x-ray today, you will receive an invoice from [facility] Radiology. Please contact [facility] Radiology at [phone] with questions or concerns regarding your invoice.   IF you received labwork today, you will receive an invoice from [facility]. Please contact [facility] at [phone] with questions or concerns regarding your invoice.   Our billing staff will not be able to assist you with questions regarding bills from these companies.  You will be contacted with the  lab results as soon as they are available. The fastest way to get your results is to activate your My Chart account. Instructions are located on the last page of this paperwork. If you have not heard from [place] regarding the results in 2 weeks, please contact this office.

## 2020-11-16 LAB — CMP14+EGFR
ALT: 8 IU/L (ref 0–32)
AST: 20 IU/L (ref 0–40)
Albumin/Globulin Ratio: 2 (ref 1.2–2.2)
Albumin: 4.5 g/dL (ref 3.7–4.7)
Alkaline Phosphatase: 84 IU/L (ref 44–121)
BUN/Creatinine Ratio: 13 (ref 12–28)
BUN: 14 mg/dL (ref 8–27)
Bilirubin Total: 0.5 mg/dL (ref 0.0–1.2)
CO2: 21 mmol/L (ref 20–29)
Calcium: 9.8 mg/dL (ref 8.7–10.3)
Chloride: 97 mmol/L (ref 96–106)
Creatinine, Ser: 1.04 mg/dL — ABNORMAL HIGH (ref 0.57–1.00)
GFR calc Af Amer: 59 mL/min/{1.73_m2} — ABNORMAL LOW (ref >59)
GFR calc non Af Amer: 51 mL/min/{1.73_m2} — ABNORMAL LOW (ref >59)
Globulin, Total: 2.2 g/dL (ref 1.5–4.5)
Glucose: 158 mg/dL — ABNORMAL HIGH (ref 65–99)
Potassium: 4.5 mmol/L (ref 3.5–5.2)
Sodium: 135 mmol/L (ref 134–144)
Total Protein: 6.7 g/dL (ref 6.0–8.5)

## 2020-11-16 LAB — CBC
Hematocrit: 38.6 % (ref 34.0–46.6)
Hemoglobin: 12.8 g/dL (ref 11.1–15.9)
MCH: 29.4 pg (ref 26.6–33.0)
MCHC: 33.2 g/dL (ref 31.5–35.7)
MCV: 89 fL (ref 79–97)
Platelets: 335 10*3/uL (ref 150–450)
RBC: 4.36 x10E6/uL (ref 3.77–5.28)
RDW: 12.9 % (ref 11.7–15.4)
WBC: 9.1 10*3/uL (ref 3.4–10.8)

## 2020-11-16 LAB — HEMOGLOBIN A1C
Est. average glucose Bld gHb Est-mCnc: 137 mg/dL
Hgb A1c MFr Bld: 6.4 % — ABNORMAL HIGH (ref 4.8–5.6)

## 2020-11-16 LAB — LIPID PANEL
Chol/HDL Ratio: 3.9 ratio (ref 0.0–4.4)
Cholesterol, Total: 271 mg/dL — ABNORMAL HIGH (ref 100–199)
HDL: 69 mg/dL (ref >39)
LDL Chol Calc (NIH): 185 mg/dL — ABNORMAL HIGH (ref 0–99)
Triglycerides: 99 mg/dL (ref 0–149)
VLDL Cholesterol Cal: 17 mg/dL (ref 5–40)

## 2020-11-16 LAB — TSH: TSH: 1.7 u[IU]/mL (ref 0.450–4.500)

## 2020-11-16 NOTE — Progress Notes (Signed)
If you could let Judith [name] know overall her labs look good. Her A1c is at 6.4, which is good. Her cholesterol is elevated but we started her on the crestor yesterday which will improve that.

## 2020-12-01 ENCOUNTER — Telehealth: Payer: Self-pay | Admitting: *Deleted

## 2020-12-01 NOTE — Telephone Encounter (Signed)
Schedule AWV.  

## 2020-12-13 ENCOUNTER — Other Ambulatory Visit: Payer: Self-pay | Admitting: Family Medicine

## 2020-12-22 DIAGNOSIS — M79673 Pain in unspecified foot: Secondary | ICD-10-CM | POA: Diagnosis not present

## 2020-12-22 DIAGNOSIS — M19079 Primary osteoarthritis, unspecified ankle and foot: Secondary | ICD-10-CM | POA: Diagnosis not present

## 2020-12-22 DIAGNOSIS — R2681 Unsteadiness on feet: Secondary | ICD-10-CM | POA: Diagnosis not present

## 2020-12-22 DIAGNOSIS — B351 Tinea unguium: Secondary | ICD-10-CM | POA: Diagnosis not present

## 2020-12-22 DIAGNOSIS — L600 Ingrowing nail: Secondary | ICD-10-CM | POA: Diagnosis not present

## 2020-12-22 DIAGNOSIS — L605 Yellow nail syndrome: Secondary | ICD-10-CM | POA: Diagnosis not present

## 2020-12-22 DIAGNOSIS — L84 Corns and callosities: Secondary | ICD-10-CM | POA: Diagnosis not present

## 2021-01-02 ENCOUNTER — Ambulatory Visit (INDEPENDENT_AMBULATORY_CARE_PROVIDER_SITE_OTHER): Payer: Medicare Other | Admitting: Cardiovascular Disease

## 2021-01-02 ENCOUNTER — Other Ambulatory Visit: Payer: Self-pay

## 2021-01-02 ENCOUNTER — Encounter: Payer: Self-pay | Admitting: Cardiovascular Disease

## 2021-01-02 VITALS — BP 110/50 | HR 61 | Ht 63.5 in | Wt 134.0 lb

## 2021-01-02 DIAGNOSIS — Z794 Long term (current) use of insulin: Secondary | ICD-10-CM

## 2021-01-02 DIAGNOSIS — I442 Atrioventricular block, complete: Secondary | ICD-10-CM

## 2021-01-02 DIAGNOSIS — Z950 Presence of cardiac pacemaker: Secondary | ICD-10-CM | POA: Diagnosis not present

## 2021-01-02 DIAGNOSIS — I10 Essential (primary) hypertension: Secondary | ICD-10-CM

## 2021-01-02 DIAGNOSIS — E1140 Type 2 diabetes mellitus with diabetic neuropathy, unspecified: Secondary | ICD-10-CM | POA: Diagnosis not present

## 2021-01-02 NOTE — Progress Notes (Signed)
Cardiology office note:    Date:  01/02/2021   ID:  Judith [name], DOB 11/10/1941, MRN [id number]  PCP:  Just, Laurita Quint, FNP  Cardiologist:  Sanda Klein, MD    Referring MD: Jacelyn Pi, Lilia Argue, *   Chief complaint: pacemaker check  History of Present Illness:    Judith [name] is a 80 y.o. female with a hx of pacemaker implantation in [place].  She has complete heart block and is pacemaker dependent.  She moved to [place] while recovering from a fall but now is a resident at [place] with no plans of returning to [place].    The patient specifically denies any chest pain at rest exertion, dyspnea at rest or with exertion, orthopnea, paroxysmal nocturnal dyspnea, syncope, palpitations, focal neurological deficits, intermittent claudication, lower extremity edema, unexplained weight gain, cough, hemoptysis or wheezing.  Blood pressure has improved since her last visit while taking losartan, carvedilol, hydrochlorothiazide.  Also recently restarted on her statin Her LDL cholesterol came back at 185.  Device interrogation shows normal function.  Estimated generator longevity is 2 years.  She has a [employer] [phone] device that is not capable of remote downloads.  She has complete heart block and 100% ventricular pacing.  There is no underlying escape rhythm.  She only requires 4 % atrial pacing.  She has never had any episodes of atrial mode switch or high ventricular rates and her heart rate histogram distribution is quite favorable.  Her initial pacemaker was implanted in 2007.  It appears that her ventricular lead is still the original one (Guidant [id number] implanted July 05, 2006).  June 09, 2010 she underwent a pacemaker generator change out and placement of a new atrial lead (Guidant [id number]).   At one previous device check we had detected a 6-second episode of possible atrial fibrillation.  In.  Her hemoglobin A1c last August was 7.9%.  She is working on improving  glycemic control.  She does not have a history of coronary disease.  She reports a normal stress test in the past.  She had an echocardiogram just a few months before coming to [place] and was told that her heart pumping strength is normal.  She was told that she has a murmur which is due to a mildly leaky valve.  There is no reported history of heart failure that I am aware of.   She does not have intermittent claudication and reports that lower extremity ABIs have been performed in the past with normal results.  She has never had a stroke or TIA.  She was taking lovastatin for lipid lowering but has been off this medication for a while.  She has lost a lot of weight since then.  Past Medical History:  Diagnosis Date  . Anemia   . Arthritis   . Biliary gastritis   . Blood transfusion without reported diagnosis    2007  . Cervical spine arthritis 04/05/2016  . Controlled type 2 diabetes mellitus with diabetic neuropathy, with long-term current use of insulin ([place])   . Depression   . Diabetes mellitus type 2 with complications ([place])   . Dilated bile duct   . Duodenal ulcer disease   . Encounter for long-term (current) use of insulin ([place])   . Gallstones 01/17/2018  . Gastric ulcer   . Gastritis and gastroduodenitis   . Gastroparesis    severe  . GERD (gastroesophageal reflux disease)   . Gout    tested for gout but  was not  . Heart murmur   . Hyperlipidemia    in past not now  . Hypertension   . Hypokalemia, gastrointestinal losses    secondary to severe gastroparesis  . Long-term insulin use in type 2 diabetes ([place]) 04/05/2016  . Osteoporosis    just had bone density test possible   . Peripheral neuropathy   . Presence of cardiac pacemaker   . S/P partial gastrectomy    due to severe gastric and gudoenal ulcers  . Seasonal allergies   . Sleep apnea    could not use CPAP  . UTI (urinary tract infection)     Past Surgical History:  Procedure Laterality Date  .  COLONOSCOPY  02/11/2018   no polyps, + small internal hemorrhoids  . EUS N/A 12/12/2017   Procedure: UPPER ENDOSCOPIC ULTRASOUND (EUS) RADIAL;  Surgeon: [name], MD;  Location: [facility];  Service: Endoscopy;  Laterality: N/A;  . GASTRECTOMY    . JOINT REPLACEMENT    . pace maker    . PARTIAL GASTRECTOMY    . TONSILLECTOMY     45 years ago  . UPPER GASTROINTESTINAL ENDOSCOPY      Current Medications: Current Meds  Medication Sig  . Biotin 1000 MCG tablet Take 1,000 mcg by mouth daily.   . blood glucose meter kit and supplies KIT Dispense based on patient and insurance preference. Check capillary glucose three times a day, before meals.  Dx E11.65, Z79.4  . carvedilol (COREG) 25 MG tablet Take 1 tablet (25 mg total) by mouth 2 (two) times daily with a meal.  . colchicine 0.6 MG tablet Take 0.6 mg by mouth daily.   . DULoxetine (CYMBALTA) 60 MG capsule TAKE 1 CAPSULE (60 MG TOTAL) DAILY BY MOUTH.  . famotidine (PEPCID) 20 MG tablet Take 1 tablet (20 mg total) by mouth at bedtime.  . gabapentin (NEURONTIN) 100 MG capsule Take 1 capsule (100 mg) every morning and afternoon, and take 4 capsules (431m) at bedtime  . glucose blood (ONE TOUCH ULTRA TEST) test strip USE TO CHECK GLUCOSE LEVELS 3 TIMES A DAY, DX E11.9, Z79.4  . HUMALOG KWIKPEN 100 UNIT/ML KwikPen CHECK FSBS BEFORE EACH MEAL AND INJECT PER SSI(0-80.0UNITS)(80-149,5U)(150-180,9U)(181-500,CALL MD)  . hydrochlorothiazide (HYDRODIURIL) 12.5 MG tablet Take 1 tablet (12.5 mg total) by mouth daily.  . insulin glargine (LANTUS SOLOSTAR) 100 UNIT/ML Solostar Pen Inject 37 Units into the skin daily at 10 pm.  . insulin lispro (HUMALOG) 100 UNIT/ML injection Inject 1-10 Units into the skin 3 (three) times daily before meals. Sliding scale   . Insulin Pen Needle (B-D ULTRAFINE III SHORT PEN) 31G X 8 MM MISC To use as directed with insulin, four times a day. Dx E11.9, Z79.4  . losartan (COZAAR) 100 MG tablet Take 100 mg by mouth  daily.   . Multiple Vitamin (MULTIVITAMIN) tablet Take 1 tablet by mouth daily.   . pantoprazole (PROTONIX) 40 MG tablet TAKE 1 TABLET (40 MG TOTAL) DAILY BY MOUTH.  . rosuvastatin (CRESTOR) 10 MG tablet Take 1 tablet (10 mg total) by mouth daily.     Allergies:   Asa [aspirin], Tylenol [acetaminophen], and Sulfa antibiotics   Social History   Socioeconomic History  . Marital status: Widowed    Spouse name: Not on file  . Number of children: 3  . Years of education: Not on file  . Highest education level: High school graduate  Occupational History  . Not on file  Tobacco Use  . Smoking  status: Never Smoker  . Smokeless tobacco: Never Used  Vaping Use  . Vaping Use: Never used  Substance and Sexual Activity  . Alcohol use: No    Alcohol/week: 0.0 standard drinks  . Drug use: No  . Sexual activity: Not Currently  Other Topics Concern  . Not on file  Social History Narrative   Used to live independently in [place]   However had significant issues with severe gastroparesis resulting in severe hypokalemia and general weakness and fall resulting in hospitalization and SNF rehab   Now (10/2017) living with her daughter in [place] until able to stabilize and regain independence.      May 2019   Patient living in assisted living, [place]   Social Determinants of Health   Financial Resource Strain: Not on file  Food Insecurity: Not on file  Transportation Needs: Not on file  Physical Activity: Not on file  Stress: Not on file  Social Connections: Not on file     Family History: The patient's family history significant for the absence of premature cardiac illness  ROS:   Please see the history of present illness.    All other systems are reviewed and are negative.   EKGs/Labs/Other Studies Reviewed:    The following studies were reviewed today: Comprehensive pacemaker interrogation  EKG:  EKG is ordered today.  Atrial sensed, ventricular paced rhythm with very broad QRS  at 174 ms.  Appropriately prolonged QTC 505 ms. Recent Labs: 11/15/2020: ALT 8; BUN 14; Creatinine, Ser 1.04; Hemoglobin 12.8; Platelets 335; Potassium 4.5; Sodium 135; TSH 1.700  A1c 6.4% Recent Lipid Panel    Component Value Date/Time   CHOL 271 (H) 11/15/2020 1141   TRIG 99 11/15/2020 1141   HDL 69 11/15/2020 1141   CHOLHDL 3.9 11/15/2020 1141   LDLCALC 185 (H) 11/15/2020 1141    Physical Exam:    VS:  BP (!) 110/50   Pulse 61   Ht 5' 3.5" (1.613 m)   Wt 134 lb (60.8 kg)   SpO2 99%   BMI 23.36 kg/m     Wt Readings from Last 3 Encounters:  01/02/21 134 lb (60.8 kg)  11/15/20 135 lb (61.2 kg)  08/23/20 137 lb 3.2 oz (62.2 kg)      General: Alert, oriented x3, no distress, healthy left subclavian pacemaker site Head: no evidence of trauma, PERRL, EOMI, no exophtalmos or lid lag, no myxedema, no xanthelasma; normal ears, nose and oropharynx Neck: normal jugular venous pulsations and no hepatojugular reflux; brisk carotid pulses without delay and no carotid bruits Chest: clear to auscultation, no signs of consolidation by percussion or palpation, normal fremitus, symmetrical and full respiratory excursions Cardiovascular: normal position and quality of the apical impulse, regular rhythm, normal first and paradoxically split second heart sounds, no murmurs, rubs or gallops Abdomen: no tenderness or distention, no masses by palpation, no abnormal pulsatility or arterial bruits, normal bowel sounds, no hepatosplenomegaly Extremities: no clubbing, cyanosis or edema; 2+ radial, ulnar and brachial pulses bilaterally; 2+ right femoral, posterior tibial and dorsalis pedis pulses; 2+ left femoral, posterior tibial and dorsalis pedis pulses; no subclavian or femoral bruits Neurological: grossly nonfocal Psych: Normal mood and affect   ASSESSMENT:    1. CHB (complete heart block) (HCC)   2. Pacemaker   3. Controlled type 2 diabetes mellitus with diabetic neuropathy, with long-term  current use of insulin ([place])   4. Essential hypertension    PLAN:    In order of problems listed above:  1. CHB: Pacemaker dependent.  No complaints of syncope or dizziness or palpitations.  2. PPM: We will bring her back in 6 months; we will increase the frequency of office checks to every 3 months next year. 3. DM: With multiple complications including gastroparesis and neuropathy. Very well controlled, A1c 6.4%. restarted statin, target LDL <100. 4. HTN: Blood pressure now well controlled.  Avoid higher doses of diuretics due to history of pseudogout and risk of dehydration in the setting of gastroparesis.    Medication Adjustments/Labs and Tests Ordered: Current medicines are reviewed at length with the patient today.  Concerns regarding medicines are outlined above.  Orders Placed This Encounter  Procedures  . EKG 12-Lead   No orders of the defined types were placed in this encounter.  Patient Instructions  Medication Instructions:  No changes *If you need a refill on your cardiac medications before your next appointment, please call your pharmacy*   Lab Work: None ordered If you have labs (blood work) drawn today and your tests are completely normal, you will receive your results only by: Marland Kitchen MyChart Message (if you have MyChart) OR . A paper copy in the mail If you have any lab test that is abnormal or we need to change your treatment, we will call you to review the results.   Testing/Procedures: None ordered   Follow-Up: At [facility], you and your health needs are our priority.  As part of our continuing mission to provide you with exceptional heart care, we have created designated Provider Care Teams.  These Care Teams include your primary Cardiologist (physician) and Advanced Practice Providers (APPs -  Physician Assistants and Nurse Practitioners) who all work together to provide you with the care you need, when you need it.  We recommend signing up for the  patient portal called "MyChart".  Sign up information is provided on this After Visit Summary.  MyChart is used to connect with patients for Virtual Visits (Telemedicine).  Patients are able to view lab/test results, encounter notes, upcoming appointments, etc.  Non-urgent messages can be sent to your provider as well.   To learn more about what you can do with MyChart, go to [email or web address].    Your next appointment:   6 month(s)  The format for your next appointment:   In Person  Provider:   Sanda Klein, MD       Signed, Sanda Klein, MD  01/02/2021 4:45 PM    [place]

## 2021-01-02 NOTE — Patient Instructions (Signed)

## 2021-01-17 ENCOUNTER — Other Ambulatory Visit: Payer: Self-pay

## 2021-01-17 ENCOUNTER — Encounter (INDEPENDENT_AMBULATORY_CARE_PROVIDER_SITE_OTHER): Payer: Medicare Other | Admitting: Ophthalmology

## 2021-01-17 DIAGNOSIS — I10 Essential (primary) hypertension: Secondary | ICD-10-CM | POA: Diagnosis not present

## 2021-01-17 DIAGNOSIS — H35033 Hypertensive retinopathy, bilateral: Secondary | ICD-10-CM | POA: Diagnosis not present

## 2021-01-17 DIAGNOSIS — E113313 Type 2 diabetes mellitus with moderate nonproliferative diabetic retinopathy with macular edema, bilateral: Secondary | ICD-10-CM

## 2021-01-17 DIAGNOSIS — H43813 Vitreous degeneration, bilateral: Secondary | ICD-10-CM | POA: Diagnosis not present

## 2021-01-25 DIAGNOSIS — Z20828 Contact with and (suspected) exposure to other viral communicable diseases: Secondary | ICD-10-CM | POA: Diagnosis not present

## 2021-01-26 DIAGNOSIS — M25552 Pain in left hip: Secondary | ICD-10-CM | POA: Diagnosis not present

## 2021-01-26 DIAGNOSIS — M1189 Other specified crystal arthropathies, multiple sites: Secondary | ICD-10-CM | POA: Diagnosis not present

## 2021-01-26 DIAGNOSIS — K219 Gastro-esophageal reflux disease without esophagitis: Secondary | ICD-10-CM | POA: Diagnosis not present

## 2021-01-26 DIAGNOSIS — M1990 Unspecified osteoarthritis, unspecified site: Secondary | ICD-10-CM | POA: Diagnosis not present

## 2021-01-26 DIAGNOSIS — M549 Dorsalgia, unspecified: Secondary | ICD-10-CM | POA: Diagnosis not present

## 2021-01-26 DIAGNOSIS — E119 Type 2 diabetes mellitus without complications: Secondary | ICD-10-CM | POA: Diagnosis not present

## 2021-01-26 DIAGNOSIS — M7060 Trochanteric bursitis, unspecified hip: Secondary | ICD-10-CM | POA: Diagnosis not present

## 2021-01-26 DIAGNOSIS — Z79899 Other long term (current) drug therapy: Secondary | ICD-10-CM | POA: Diagnosis not present

## 2021-01-26 DIAGNOSIS — R768 Other specified abnormal immunological findings in serum: Secondary | ICD-10-CM | POA: Diagnosis not present

## 2021-02-14 ENCOUNTER — Encounter: Payer: Self-pay | Admitting: Family Medicine

## 2021-02-14 ENCOUNTER — Ambulatory Visit (INDEPENDENT_AMBULATORY_CARE_PROVIDER_SITE_OTHER): Payer: Medicare Other | Admitting: Family Medicine

## 2021-02-14 ENCOUNTER — Other Ambulatory Visit: Payer: Self-pay

## 2021-02-14 VITALS — BP 101/63 | HR 83 | Temp 98.1°F | Ht 63.0 in | Wt 131.4 lb

## 2021-02-14 DIAGNOSIS — E1140 Type 2 diabetes mellitus with diabetic neuropathy, unspecified: Secondary | ICD-10-CM | POA: Diagnosis not present

## 2021-02-14 DIAGNOSIS — I10 Essential (primary) hypertension: Secondary | ICD-10-CM

## 2021-02-14 DIAGNOSIS — K2970 Gastritis, unspecified, without bleeding: Secondary | ICD-10-CM | POA: Diagnosis not present

## 2021-02-14 DIAGNOSIS — F329 Major depressive disorder, single episode, unspecified: Secondary | ICD-10-CM

## 2021-02-14 DIAGNOSIS — G629 Polyneuropathy, unspecified: Secondary | ICD-10-CM | POA: Diagnosis not present

## 2021-02-14 DIAGNOSIS — M1000 Idiopathic gout, unspecified site: Secondary | ICD-10-CM | POA: Diagnosis not present

## 2021-02-14 DIAGNOSIS — K2990 Gastroduodenitis, unspecified, without bleeding: Secondary | ICD-10-CM | POA: Diagnosis not present

## 2021-02-14 DIAGNOSIS — Z794 Long term (current) use of insulin: Secondary | ICD-10-CM

## 2021-02-14 LAB — HEMOGLOBIN A1C
Est. average glucose Bld gHb Est-mCnc: 171 mg/dL
Hgb A1c MFr Bld: 7.6 % — ABNORMAL HIGH (ref 4.8–5.6)

## 2021-02-14 LAB — CMP14+EGFR
ALT: 10 IU/L (ref 0–32)
AST: 21 IU/L (ref 0–40)
Albumin/Globulin Ratio: 1.7 (ref 1.2–2.2)
Albumin: 4.2 g/dL (ref 3.7–4.7)
Alkaline Phosphatase: 78 IU/L (ref 44–121)
BUN/Creatinine Ratio: 15 (ref 12–28)
BUN: 15 mg/dL (ref 8–27)
Bilirubin Total: 0.5 mg/dL (ref 0.0–1.2)
CO2: 21 mmol/L (ref 20–29)
Calcium: 9.4 mg/dL (ref 8.7–10.3)
Chloride: 100 mmol/L (ref 96–106)
Creatinine, Ser: 1 mg/dL (ref 0.57–1.00)
GFR calc Af Amer: 61 mL/min/{1.73_m2} (ref >59)
GFR calc non Af Amer: 53 mL/min/{1.73_m2} — ABNORMAL LOW (ref >59)
Globulin, Total: 2.5 g/dL (ref 1.5–4.5)
Glucose: 137 mg/dL — ABNORMAL HIGH (ref 65–99)
Potassium: 4.4 mmol/L (ref 3.5–5.2)
Sodium: 140 mmol/L (ref 134–144)
Total Protein: 6.7 g/dL (ref 6.0–8.5)

## 2021-02-14 LAB — LIPID PANEL
Chol/HDL Ratio: 2.5 ratio (ref 0.0–4.4)
Cholesterol, Total: 140 mg/dL (ref 100–199)
HDL: 56 mg/dL (ref >39)
LDL Chol Calc (NIH): 66 mg/dL (ref 0–99)
Triglycerides: 95 mg/dL (ref 0–149)
VLDL Cholesterol Cal: 18 mg/dL (ref 5–40)

## 2021-02-14 NOTE — Patient Instructions (Addendum)
  Health Maintenance After Age 81 After age 81, you are at a higher risk for certain long-term diseases and infections as well as injuries from falls. Falls are a major cause of broken bones and head injuries in people who are older than age 81. Getting regular preventive care can help to keep you healthy and well. Preventive care includes getting regular testing and making lifestyle changes as recommended by your health care provider. Talk with your health care provider about:  Which screenings and tests you should have. A screening is a test that checks for a disease when you have no symptoms.  A diet and exercise plan that is right for you. What should I know about screenings and tests to prevent falls? Screening and testing are the best ways to find a health problem early. Early diagnosis and treatment give you the best chance of managing medical conditions that are common after age 81. Certain conditions and lifestyle choices may make you more likely to have a fall. Your health care provider may recommend:  Regular vision checks. Poor vision and conditions such as cataracts can make you more likely to have a fall. If you wear glasses, make sure to get your prescription updated if your vision changes.  Medicine review. Work with your health care provider to regularly review all of the medicines you are taking, including over-the-counter medicines. Ask your health care provider about any side effects that may make you more likely to have a fall. Tell your health care provider if any medicines that you take make you feel dizzy or sleepy.  Osteoporosis screening. Osteoporosis is a condition that causes the bones to get weaker. This can make the bones weak and cause them to break more easily.  Blood pressure screening. Blood pressure changes and medicines to control blood pressure can make you feel dizzy.  Strength and balance checks. Your health care provider may recommend certain tests to  check your strength and balance while standing, walking, or changing positions.  Foot health exam. Foot pain and numbness, as well as not wearing proper footwear, can make you more likely to have a fall.  Depression screening. You may be more likely to have a fall if you have a fear of falling, feel emotionally low, or feel unable to do activities that you used to do.  Alcohol use screening. Using too much alcohol can affect your balance and may make you more likely to have a fall. What actions can I take to lower my risk of falls? General instructions  Talk with your health care provider about your risks for falling. Tell your health care provider if: ? You fall. Be sure to tell your health care provider about all falls, even ones that seem minor. ? You feel dizzy, sleepy, or off-balance.  Take over-the-counter and prescription medicines only as told by your health care provider. These include any supplements.  Eat a healthy diet and maintain a healthy weight. A healthy diet includes low-fat dairy products, low-fat (lean) meats, and fiber from whole grains, beans, and lots of fruits and vegetables. Home safety  Remove any tripping hazards, such as rugs, cords, and clutter.  Install safety equipment such as grab bars in bathrooms and safety rails on stairs.  Keep rooms and walkways well-lit. Activity  Follow a regular exercise program to stay fit. This will help you maintain your balance. Ask your health care provider what types of exercise are appropriate for you.  If you need a cane   or walker, use it as recommended by your health care provider.  Wear supportive shoes that have nonskid soles.   Lifestyle  Do not drink alcohol if your health care provider tells you not to drink.  If you drink alcohol, limit how much you have: ? 0-1 drink a day for women. ? 0-2 drinks a day for men.  Be aware of how much alcohol is in your drink. In the [place] one drink equals one typical bottle  of beer (12 oz), one-half glass of wine (5 oz), or one shot of hard liquor (1 oz).  Do not use any products that contain nicotine or tobacco, such as cigarettes and e-cigarettes. If you need help quitting, ask your health care provider. Summary  Having a healthy lifestyle and getting preventive care can help to protect your health and wellness after age 81.  Screening and testing are the best way to find a health problem early and help you avoid having a fall. Early diagnosis and treatment give you the best chance for managing medical conditions that are more common for people who are older than age 81.  Falls are a major cause of broken bones and head injuries in people who are older than age 81. Take precautions to prevent a fall at home.  Work with your health care provider to learn what changes you can make to improve your health and wellness and to prevent falls. This information is not intended to replace advice given to you by your health care provider. Make sure you discuss any questions you have with your health care provider. Document Revised: 04/02/2019 Document Reviewed: 10/23/2017 Elsevier Patient Education  2021 Elsevier Inc.   If you have lab work done today you will be contacted with your lab results within the next 2 weeks.  If you have not heard from us then please contact us. The fastest way to get your results is to register for My Chart.   IF you received an x-ray today, you will receive an invoice from [facility]. Please contact [facility] at [phone] with questions or concerns regarding your invoice.   IF you received labwork today, you will receive an invoice from [facility]. Please contact [facility] at [phone] with questions or concerns regarding your invoice.   Our billing staff will not be able to assist you with questions regarding bills from these companies.  You will be contacted with the lab results as soon as they are available.  The fastest way to get your results is to activate your My Chart account. Instructions are located on the last page of this paperwork. If you have not heard from us regarding the results in 2 weeks, please contact this office.      

## 2021-02-14 NOTE — Progress Notes (Signed)
2/22/202210:10 AM  Judith [name] 1941/02/24, 80 y.o., female [id number]  Chief Complaint  Patient presents with  . Medical Management of Chronic Issues    3  m f/u - forms to be completed for [facility]     HPI:   Patient is a 80 y.o. female with past medical history significant for DM2 insulin dependent, HTN, pacemaker, gastroparesis, polyarthritis, gastric ulcers, depressionwho presents today for routine follow up   Patient Care Team: Shardae Kleinman, Laurita Quint, FNP as PCP - General (Family Medicine) Croitoru, Dani Gobble, MD as PCP - Cardiology (Cardiology) Eldridge Abrahams, MD as Referring Physician (Surgical Oncology) Pyrtle, Lajuan Lines, MD as Consulting Physician (Gastroenterology) Madelin Rear, MD as Referring Physician (Endocrinology) Croitoru, Dani Gobble, MD as Consulting Physician (Cardiology)  Lives at [facility] expensive to live Will go on Medicaid end of the year   Overall doing well Has had a vague viral illness that's improving Took a muscle relaxant for back pain (cyclobenzaprine)    Essential hypertension Carvedilol 25 mg bid Losartan 177m HCTZ 12.563madded last OV  BP Readings from Last 3 Encounters:  02/14/21 101/63  01/02/21 (!) 110/50  11/15/20 (!) 176/87     DM Managed by endo.  Neuropathy well controlled on gabapentin Gabapentin tid (400 mg dose at bedtime) Cymbalta daily 6020mLantus 28 units nightly SS before meals   GERD:  reflux/h/o recurrent ulcers Asymptomatic at this time, on regular diet.  pantoprazole 103m60mily pepcid 20mg8m   Presence of cardiac pacemaker managed by cards: last seen 01/02/21 , She is 100% paced History of complete heart block  Hyperlipid Last OV started crestor Lab Results  Component Value Date   CHOL 271 (H) 11/15/2020   HDL 69 11/15/2020   LDLCALC 185 (H) 11/15/2020   TRIG 99 11/15/2020   CHOLHDL 3.9 11/15/2020    Gout Colchicine daily Denies gout flares  Health Maintenance   Topic Date Due  . TETANUS/TDAP  08/23/2021 (Originally 03/08/1960)  . OPHTHALMOLOGY EXAM  03/09/2021  . HEMOGLOBIN A1C  05/15/2021  . FOOT EXAM  08/23/2021  . INFLUENZA VACCINE  Completed  . DEXA SCAN  Completed  . COVID-19 Vaccine  Completed  . PNA vac Low Risk Adult  Completed     Depression screen [facility] 11/15/2020 10/19/2020  Decreased Interest 0 0 0  Down, Depressed, Hopeless 0 0 0  PHQ - 2 Score 0 0 0  Altered sleeping - - -  Tired, decreased energy - - -  Change in appetite - - -  Feeling bad or failure about yourself  - - -  Trouble concentrating - - -  Moving slowly or fidgety/restless - - -  Suicidal thoughts - - -  PHQ-9 Score - - -  Difficult doing work/chores - - -    Fall Risk  02/14/2021 11/15/2020 10/19/2020 10/07/2020 10/05/2020  Falls in the past year? 0 0 0 0 0  Number falls in past yr: 0 0 0 0 0  Injury with Fall? 0 0 0 0 0  Comment - - - - -  Risk Factor Category  - - - - -  Risk for fall due to : - - - - -  Follow up Falls evaluation completed Falls evaluation completed Falls evaluation completed Falls evaluation completed Falls evaluation completed     Allergies  Allergen Reactions  . Asa [Aspirin] Other (See Comments)    Causes ulcers   . Tylenol [Acetaminophen]     Intolerance due to  liver damage  . Sulfa Antibiotics Rash    Prior to Admission medications   Medication Sig Start Date End Date Taking? Authorizing Provider  Biotin 1000 MCG tablet Take 1,000 mcg by mouth daily.    Yes [provider]  blood glucose meter kit and supplies KIT Dispense based on patient and insurance preference. Check capillary glucose three times a day, before meals.  Dx E11.65, Z79.4 10/29/17  Yes Rutherford Guys, MD  carvedilol (COREG) 25 MG tablet Take 1 tablet (25 mg total) by mouth 2 (two) times daily with a meal. 01/22/19  Yes Rutherford Guys, MD  colchicine 0.6 MG tablet Take 0.6 mg by mouth daily.    Yes [provider]   DULoxetine (CYMBALTA) 60 MG capsule TAKE 1 CAPSULE (60 MG TOTAL) DAILY BY MOUTH. 04/28/18  Yes Rutherford Guys, MD  famotidine (PEPCID) 20 MG tablet Take 1 tablet (20 mg total) by mouth at bedtime. 06/11/19  Yes Rutherford Guys, MD  gabapentin (NEURONTIN) 100 MG capsule Take 1 capsule (100 mg) every morning and afternoon, and take 4 capsules (469m) at bedtime 04/03/18  Yes SRutherford Guys MD  glucose blood (ONE TOUCH ULTRA TEST) test strip USE TO CHECK GLUCOSE LEVELS 3 TIMES A DAY, DX E11.9, Z79.4 11/30/17  Yes SRutherford Guys MD  HUMALOG KWIKPEN 100 UNIT/ML KwikPen CHECK FSBS [facility] AND INJECT PER SSI(0-80.0UNITS)(80-149,5U)(150-180,9U)(181-500,CALL MD) 09/16/19  Yes SRutherford Guys MD  insulin glargine (LANTUS SOLOSTAR) 100 UNIT/ML Solostar Pen Inject 37 Units into the skin daily at 10 pm. 08/23/20  Yes SRutherford Guys MD  insulin lispro (HUMALOG) 100 UNIT/ML injection Inject 1-10 Units into the skin 3 (three) times daily before meals. Sliding scale    Yes [provider]  Insulin Pen Needle (B-D ULTRAFINE III SHORT PEN) 31G X 8 MM MISC To use as directed with insulin, four times a day. Dx E11.9, Z79.4 11/08/17  Yes SRutherford Guys MD  losartan (COZAAR) 100 MG tablet Take 100 mg by mouth daily.    Yes [provider]  Multiple Vitamin (MULTIVITAMIN) tablet Take 1 tablet by mouth daily.    Yes [provider]  pantoprazole (PROTONIX) 40 MG tablet TAKE 1 TABLET (40 MG TOTAL) DAILY BY MOUTH. 04/28/18  Yes SRutherford Guys MD    Past Medical History:  Diagnosis Date  . Anemia   . Arthritis   . Biliary gastritis   . Blood transfusion without reported diagnosis    2007  . Cervical spine arthritis 04/05/2016  . Controlled type 2 diabetes mellitus with diabetic neuropathy, with long-term current use of insulin ([facility]   . Depression   . Diabetes mellitus type 2 with complications ([facility]   . Dilated bile duct   . Duodenal ulcer disease   . Encounter for  long-term (current) use of insulin ([facility]   . Gallstones 01/17/2018  . Gastric ulcer   . Gastritis and gastroduodenitis   . Gastroparesis    severe  . GERD (gastroesophageal reflux disease)   . Gout    tested for gout but was not  . Heart murmur   . Hyperlipidemia    in past not now  . Hypertension   . Hypokalemia, gastrointestinal losses    secondary to severe gastroparesis  . Long-term insulin use in type 2 diabetes ([facility] 04/05/2016  . Osteoporosis    Brenon Antosh had bone density test possible   . Peripheral neuropathy   . Presence of cardiac pacemaker   .  S/P partial gastrectomy    due to severe gastric and gudoenal ulcers  . Seasonal allergies   . Sleep apnea    could not use CPAP  . UTI (urinary tract infection)     Past Surgical History:  Procedure Laterality Date  . COLONOSCOPY  02/11/2018   no polyps, + small internal hemorrhoids  . EUS N/A 12/12/2017   Procedure: UPPER ENDOSCOPIC ULTRASOUND (EUS) RADIAL;  Surgeon: [name], MD;  Location: [facility] ENDOSCOPY;  Service: Endoscopy;  Laterality: N/A;  . GASTRECTOMY    . JOINT REPLACEMENT    . pace maker    . PARTIAL GASTRECTOMY    . TONSILLECTOMY     45 years ago  . UPPER GASTROINTESTINAL ENDOSCOPY      Social History   Tobacco Use  . Smoking status: Never Smoker  . Smokeless tobacco: Never Used  Substance Use Topics  . Alcohol use: No    Alcohol/week: 0.0 standard drinks    Family History  Problem Relation Age of Onset  . Diabetes Mother   . Diabetes Father   . Heart disease Father   . Diabetes Sister   . Heart disease Son   . Colon cancer Neg Hx   . Colon polyps Neg Hx   . Rectal cancer Neg Hx   . Stomach cancer Neg Hx   . Esophageal cancer Neg Hx     Review of Systems  Constitutional: Negative for chills, fever and malaise/fatigue.  Eyes: Negative for blurred vision and double vision.  Respiratory: Negative for cough, shortness of breath and wheezing.   Cardiovascular: Negative for chest pain,  palpitations and leg swelling.  Gastrointestinal: Positive for heartburn. Negative for abdominal pain, blood in stool, constipation, diarrhea, nausea and vomiting.  Genitourinary: Negative for dysuria, frequency and hematuria.  Musculoskeletal: Negative for back pain and joint pain.  Skin: Negative for rash.  Neurological: Negative for dizziness, weakness and headaches.     OBJECTIVE:  Today's Vitals   02/14/21 0948  BP: 101/63  Pulse: 83  Temp: 98.1 F (36.7 C)  TempSrc: Temporal  SpO2: 97%  Weight: 131 lb 6.4 oz (59.6 kg)  Height: '5\' 3"'  (1.6 m)   Body mass index is 23.28 kg/m.   Physical Exam Constitutional:      General: She is not in acute distress.    Appearance: Normal appearance. She is not ill-appearing.  HENT:     Head: Normocephalic.     Right Ear: Tympanic membrane, ear canal and external ear normal. There is no impacted cerumen.     Left Ear: Tympanic membrane, ear canal and external ear normal. There is no impacted cerumen.  Cardiovascular:     Rate and Rhythm: Normal rate and regular rhythm.     Pulses: Normal pulses.     Heart sounds: Normal heart sounds. No murmur heard. No friction rub. No gallop.   Pulmonary:     Effort: Pulmonary effort is normal. No respiratory distress.     Breath sounds: Normal breath sounds. No stridor. No wheezing, rhonchi or rales.  Abdominal:     General: Bowel sounds are normal.     Palpations: Abdomen is soft.     Tenderness: There is no abdominal tenderness.  Musculoskeletal:     Right lower leg: No edema.     Left lower leg: No edema.  Skin:    General: Skin is warm and dry.  Neurological:     Mental Status: She is alert and oriented to person, place,  and time.  Psychiatric:        Mood and Affect: Mood normal.        Behavior: Behavior normal.     No results found for this or any previous visit (from the past 24 hour(s)).  No results found.   ASSESSMENT and PLAN  Problem List Items Addressed This Visit       Cardiovascular and Mediastinum   Hypertension - Primary   Relevant Orders   CMP14+EGFR     Digestive   Gastritis and gastroduodenitis     Endocrine   Controlled type 2 diabetes mellitus with diabetic neuropathy, with long-term current use of insulin (HCC)   Relevant Orders   Lipid Panel   Hemoglobin A1c     Nervous and Auditory   Peripheral neuropathy     Other   Depression   Gout          Plan  Needs 2nd Shingrix at local pharmacy  Stop HCTZ due to hypotension   Chronic conditions stable on current regimens  Continue follow up with Endo of DM control  Return in about 3 months (around 05/14/2021).    Huston Foley Jorel Gravlin, FNP-BC Primary Care at [facility] [place], [place] [address] Ph.  [phone] Fax [phone]

## 2021-02-15 ENCOUNTER — Telehealth: Payer: Self-pay | Admitting: Family Medicine

## 2021-02-15 ENCOUNTER — Ambulatory Visit: Payer: Medicare Other | Admitting: Family Medicine

## 2021-02-15 NOTE — Telephone Encounter (Signed)
Judith [name] faxed over a from an  order summary report that was faxed yesterday . [name] needs it signed and faxed over now / they are faxing over again

## 2021-02-15 NOTE — Telephone Encounter (Signed)
Forms have been signed and faxed back.

## 2021-02-15 NOTE — Telephone Encounter (Signed)
I have received the form and Judith [name] now has it. She has called the office back to inform them that the provider on the form is Dr. Pamella Pert. If they are okay with Huston Foley just signing it then we can do that, but if they need to send a new form with [facility] name on it we can sign that one.   They will give a call back once they get out of their meeting.

## 2021-02-15 NOTE — Progress Notes (Signed)
Overall labs look good. No medication changes needed at this time.

## 2021-03-09 DIAGNOSIS — R748 Abnormal levels of other serum enzymes: Secondary | ICD-10-CM | POA: Diagnosis not present

## 2021-03-09 DIAGNOSIS — Z6821 Body mass index (BMI) 21.0-21.9, adult: Secondary | ICD-10-CM | POA: Diagnosis not present

## 2021-03-09 DIAGNOSIS — Z794 Long term (current) use of insulin: Secondary | ICD-10-CM | POA: Diagnosis not present

## 2021-03-09 DIAGNOSIS — E113593 Type 2 diabetes mellitus with proliferative diabetic retinopathy without macular edema, bilateral: Secondary | ICD-10-CM | POA: Diagnosis not present

## 2021-03-09 DIAGNOSIS — K3184 Gastroparesis: Secondary | ICD-10-CM | POA: Diagnosis not present

## 2021-03-09 DIAGNOSIS — E1140 Type 2 diabetes mellitus with diabetic neuropathy, unspecified: Secondary | ICD-10-CM | POA: Diagnosis not present

## 2021-04-11 DIAGNOSIS — L603 Nail dystrophy: Secondary | ICD-10-CM | POA: Diagnosis not present

## 2021-04-11 DIAGNOSIS — B351 Tinea unguium: Secondary | ICD-10-CM | POA: Diagnosis not present

## 2021-04-11 DIAGNOSIS — M79673 Pain in unspecified foot: Secondary | ICD-10-CM | POA: Diagnosis not present

## 2021-04-11 DIAGNOSIS — E1140 Type 2 diabetes mellitus with diabetic neuropathy, unspecified: Secondary | ICD-10-CM | POA: Diagnosis not present

## 2021-04-11 DIAGNOSIS — R2681 Unsteadiness on feet: Secondary | ICD-10-CM | POA: Diagnosis not present

## 2021-04-11 DIAGNOSIS — L851 Acquired keratosis [keratoderma] palmaris et plantaris: Secondary | ICD-10-CM | POA: Diagnosis not present

## 2021-04-18 DIAGNOSIS — Z23 Encounter for immunization: Secondary | ICD-10-CM | POA: Diagnosis not present

## 2021-05-02 ENCOUNTER — Other Ambulatory Visit: Payer: Self-pay

## 2021-05-02 ENCOUNTER — Encounter (INDEPENDENT_AMBULATORY_CARE_PROVIDER_SITE_OTHER): Payer: Medicare Other | Admitting: Ophthalmology

## 2021-05-02 DIAGNOSIS — H43813 Vitreous degeneration, bilateral: Secondary | ICD-10-CM

## 2021-05-02 DIAGNOSIS — E113311 Type 2 diabetes mellitus with moderate nonproliferative diabetic retinopathy with macular edema, right eye: Secondary | ICD-10-CM

## 2021-05-02 DIAGNOSIS — E113392 Type 2 diabetes mellitus with moderate nonproliferative diabetic retinopathy without macular edema, left eye: Secondary | ICD-10-CM

## 2021-05-02 DIAGNOSIS — I10 Essential (primary) hypertension: Secondary | ICD-10-CM

## 2021-05-02 DIAGNOSIS — H35033 Hypertensive retinopathy, bilateral: Secondary | ICD-10-CM

## 2021-05-09 DIAGNOSIS — E113593 Type 2 diabetes mellitus with proliferative diabetic retinopathy without macular edema, bilateral: Secondary | ICD-10-CM | POA: Diagnosis not present

## 2021-05-09 DIAGNOSIS — I10 Essential (primary) hypertension: Secondary | ICD-10-CM | POA: Diagnosis not present

## 2021-05-09 DIAGNOSIS — Z950 Presence of cardiac pacemaker: Secondary | ICD-10-CM | POA: Diagnosis not present

## 2021-05-09 DIAGNOSIS — Z8711 Personal history of peptic ulcer disease: Secondary | ICD-10-CM | POA: Diagnosis not present

## 2021-05-09 DIAGNOSIS — Z8639 Personal history of other endocrine, nutritional and metabolic disease: Secondary | ICD-10-CM | POA: Diagnosis not present

## 2021-05-09 DIAGNOSIS — E1140 Type 2 diabetes mellitus with diabetic neuropathy, unspecified: Secondary | ICD-10-CM | POA: Diagnosis not present

## 2021-05-16 ENCOUNTER — Ambulatory Visit: Payer: Medicare Other | Admitting: Family Medicine

## 2021-06-06 ENCOUNTER — Encounter (HOSPITAL_BASED_OUTPATIENT_CLINIC_OR_DEPARTMENT_OTHER): Payer: Self-pay | Admitting: Obstetrics and Gynecology

## 2021-06-06 ENCOUNTER — Emergency Department (HOSPITAL_BASED_OUTPATIENT_CLINIC_OR_DEPARTMENT_OTHER): Payer: Medicare Other | Admitting: Radiology

## 2021-06-06 ENCOUNTER — Emergency Department (HOSPITAL_BASED_OUTPATIENT_CLINIC_OR_DEPARTMENT_OTHER)
Admission: EM | Admit: 2021-06-06 | Discharge: 2021-06-06 | Disposition: A | Discharge status: Home / Self Care | Payer: Medicare Other | Attending: Emergency Medicine | Admitting: Emergency Medicine

## 2021-06-06 ENCOUNTER — Other Ambulatory Visit: Payer: Self-pay

## 2021-06-06 DIAGNOSIS — I10 Essential (primary) hypertension: Secondary | ICD-10-CM | POA: Insufficient documentation

## 2021-06-06 DIAGNOSIS — Z9660 Presence of unspecified orthopedic joint implant: Secondary | ICD-10-CM | POA: Diagnosis not present

## 2021-06-06 DIAGNOSIS — S99911A Unspecified injury of right ankle, initial encounter: Secondary | ICD-10-CM | POA: Diagnosis present

## 2021-06-06 DIAGNOSIS — Z79899 Other long term (current) drug therapy: Secondary | ICD-10-CM | POA: Insufficient documentation

## 2021-06-06 DIAGNOSIS — W1830XA Fall on same level, unspecified, initial encounter: Secondary | ICD-10-CM | POA: Diagnosis not present

## 2021-06-06 DIAGNOSIS — Z794 Long term (current) use of insulin: Secondary | ICD-10-CM | POA: Insufficient documentation

## 2021-06-06 DIAGNOSIS — Z950 Presence of cardiac pacemaker: Secondary | ICD-10-CM | POA: Diagnosis not present

## 2021-06-06 DIAGNOSIS — E1140 Type 2 diabetes mellitus with diabetic neuropathy, unspecified: Secondary | ICD-10-CM | POA: Insufficient documentation

## 2021-06-06 DIAGNOSIS — Y9289 Other specified places as the place of occurrence of the external cause: Secondary | ICD-10-CM | POA: Diagnosis not present

## 2021-06-06 DIAGNOSIS — S93401A Sprain of unspecified ligament of right ankle, initial encounter: Secondary | ICD-10-CM | POA: Diagnosis not present

## 2021-06-06 DIAGNOSIS — M25571 Pain in right ankle and joints of right foot: Secondary | ICD-10-CM | POA: Diagnosis not present

## 2021-06-06 DIAGNOSIS — Y9301 Activity, walking, marching and hiking: Secondary | ICD-10-CM | POA: Diagnosis not present

## 2021-06-06 DIAGNOSIS — W19XXXA Unspecified fall, initial encounter: Secondary | ICD-10-CM | POA: Diagnosis not present

## 2021-06-06 DIAGNOSIS — R0902 Hypoxemia: Secondary | ICD-10-CM | POA: Diagnosis not present

## 2021-06-06 MED ORDER — LIDOCAINE 5 % EX PTCH: 1.0000 | MEDICATED_PATCH | CUTANEOUS | 0 refills | Status: DC

## 2021-06-06 NOTE — ED Provider Notes (Signed)
[facility] EMERGENCY DEPT Provider Note   CSN: [id number] Arrival date & time: 06/06/21  1239     History Chief Complaint  Patient presents with   Ankle Pain   Fall    Judith [name] is a 80 y.o. female.  HPI     80 year old female with a history of type 2 diabetes, hypertension, hyperlipidemia, duodenal ulcers, pacemaker, presents with concern for ankle pain after a fall.  Reports she twisted her ankle as she was walking.  Denies head trauma, headache, LOC, n/v. Denies neck pain, back pain, chest or abdominal pain. No numbness or weakness.  Reports pain moderate in ankle, ok if elevated and not bearing weight but severe with weight bearing.   Past Medical History:  Diagnosis Date   Anemia    Arthritis    Biliary gastritis    Blood transfusion without reported diagnosis    2007   Cervical spine arthritis 04/05/2016   Controlled type 2 diabetes mellitus with diabetic neuropathy, with long-term current use of insulin ([place])    Depression    Diabetes mellitus type 2 with complications ([place])    Dilated bile duct    Duodenal ulcer disease    Encounter for long-term (current) use of insulin ([place])    Gallstones 01/17/2018   Gastric ulcer    Gastritis and gastroduodenitis    Gastroparesis    severe   GERD (gastroesophageal reflux disease)    Gout    tested for gout but was not   Heart murmur    Hyperlipidemia    in past not now   Hypertension    Hypokalemia, gastrointestinal losses    secondary to severe gastroparesis   Long-term insulin use in type 2 diabetes ([place]) 04/05/2016   Osteoporosis    just had bone density test possible    Peripheral neuropathy    Presence of cardiac pacemaker    S/P partial gastrectomy    due to severe gastric and gudoenal ulcers   Seasonal allergies    Sleep apnea    could not use CPAP   UTI (urinary tract infection)     Patient Active Problem List   Diagnosis Date Noted   Unspecified inflammatory spondylopathy, cervical  region ([place]) 11/15/2020   Multilevel degenerative disc disease 04/03/2018   Acute blood loss anemia 01/17/2018   Gallstones 01/17/2018   Weight loss, unintentional 01/17/2018   Dilated bile duct    Elevated liver function tests    Gastritis and gastroduodenitis    S/P partial gastrectomy    Peripheral neuropathy    Hypertension    Gastroparesis    Duodenal ulcer disease    Controlled type 2 diabetes mellitus with diabetic neuropathy, with long-term current use of insulin (HCC)    Depression    Biliary gastritis    Seasonal allergies    Presence of cardiac pacemaker    Gout    Cervical spine arthritis 04/05/2016   Long-term insulin use in type 2 diabetes ([place]) 04/05/2016    Past Surgical History:  Procedure Laterality Date   COLONOSCOPY  02/11/2018   no polyps, + small internal hemorrhoids   EUS N/A 12/12/2017   Procedure: UPPER ENDOSCOPIC ULTRASOUND (EUS) RADIAL;  Surgeon: [name], MD;  Location: [facility];  Service: Endoscopy;  Laterality: N/A;   GASTRECTOMY     JOINT REPLACEMENT     pace maker     PARTIAL GASTRECTOMY     TONSILLECTOMY     45 years ago   UPPER GASTROINTESTINAL  ENDOSCOPY       OB History     Gravida      Para      Term      Preterm      AB      Living  3      SAB      IAB      Ectopic      Multiple      Live Births              Family History  Problem Relation Age of Onset   Diabetes Mother    Diabetes Father    Heart disease Father    Diabetes Sister    Heart disease Son    Colon cancer Neg Hx    Colon polyps Neg Hx    Rectal cancer Neg Hx    Stomach cancer Neg Hx    Esophageal cancer Neg Hx     Social History   Tobacco Use   Smoking status: Never   Smokeless tobacco: Never  Vaping Use   Vaping Use: Never used  Substance Use Topics   Alcohol use: No    Alcohol/week: 0.0 standard drinks   Drug use: No    Home Medications Prior to Admission medications   Medication Sig Start Date End Date  Taking? Authorizing Provider  Biotin 1000 MCG tablet Take 1,000 mcg by mouth daily.    Yes [provider]  blood glucose meter kit and supplies KIT Dispense based on patient and insurance preference. Check capillary glucose three times a day, before meals.  Dx E11.65, Z79.4 10/29/17  Yes Jacelyn Pi, Lilia Argue, MD  carvedilol (COREG) 25 MG tablet Take 1 tablet (25 mg total) by mouth 2 (two) times daily with a meal. 01/22/19  Yes Jacelyn Pi, Lilia Argue, MD  colchicine 0.6 MG tablet Take 0.6 mg by mouth daily.    Yes [provider]  famotidine (PEPCID) 20 MG tablet Take 1 tablet (20 mg total) by mouth at bedtime. 06/11/19  Yes Jacelyn Pi, Lilia Argue, MD  gabapentin (NEURONTIN) 100 MG capsule Take 1 capsule (100 mg) every morning and afternoon, and take 4 capsules (482m) at bedtime 04/03/18  Yes SJacelyn Pi Irma M, MD  glucose blood (ONE TOUCH ULTRA TEST) test strip USE TO CHECK GLUCOSE LEVELS 3 TIMES A DAY, DX E11.9, Z79.4 11/30/17  Yes SJacelyn Pi ILilia Argue MD  HUMALOG KMary[place] Incorporated100 UNIT/ML KwikPen CHECK FSBS BEFORE EGastrodiagnostics A Medical Group [facility] Surgery [facility] OrangeMEAL AND INJECT PER SSI(0-80.0UNITS)(80-149,5U)(150-180,9U)(181-500,CALL MD) 09/16/19  Yes SJacelyn Pi Irma M, MD  insulin glargine (LANTUS SOLOSTAR) 100 UNIT/ML Solostar Pen Inject 37 Units into the skin daily at 10 pm. 08/23/20  Yes SJacelyn Pi ILilia Argue MD  Insulin Pen Needle (B-D ULTRAFINE III SHORT PEN) 31G X 8 MM MISC To use as directed with insulin, four times a day. Dx E11.9, Z79.4 11/08/17  Yes SJacelyn Pi Irma M, MD  lidocaine (LIDODERM) 5 % Place 1 patch onto the skin daily. Remove & Discard patch within 12 hours or as directed by MD 06/06/21  Yes SGareth Morgan MD  losartan (COZAAR) 100 MG tablet Take 100 mg by mouth daily.    Yes [provider]  Multiple Vitamin (MULTIVITAMIN) tablet Take 1 tablet by mouth daily.    Yes [provider]  pantoprazole (PROTONIX) 40 MG tablet TAKE 1 TABLET (40 MG TOTAL) DAILY BY MOUTH. 04/28/18  Yes  SJacelyn Pi Irma M, MD  rosuvastatin (CRESTOR) 10 MG tablet Take 1 tablet (  10 mg total) by mouth daily. 11/15/20  Yes Just, Laurita Quint, FNP  DULoxetine (CYMBALTA) 60 MG capsule TAKE 1 CAPSULE (60 MG TOTAL) DAILY BY MOUTH. Patient not taking: Reported on 06/06/2021 04/28/18   Jacelyn Pi, Lilia Argue, MD  insulin lispro (HUMALOG) 100 UNIT/ML injection Inject 1-10 Units into the skin 3 (three) times daily before meals. Sliding scale  Patient not taking: Reported on 06/06/2021    [provider]    Allergies    Asa [aspirin], Tylenol [acetaminophen], and Sulfa antibiotics  Review of Systems   Review of Systems  Constitutional:  Negative for fever.  Respiratory:  Negative for shortness of breath.   Cardiovascular:  Negative for chest pain.  Gastrointestinal:  Negative for abdominal pain, nausea and vomiting.  Musculoskeletal:  Positive for arthralgias and gait problem. Negative for back pain and neck pain.  Skin:  Negative for rash and wound.  Neurological:  Negative for weakness, numbness and headaches.   Physical Exam Updated Vital Signs BP (!) 164/63 (BP Location: Right Arm)   Pulse 66   Temp 98.1 F (36.7 C)   Resp 14   Ht '5\' 3"'  (1.6 m)   Wt 61.2 kg   SpO2 98%   BMI 23.91 kg/m   Physical Exam Vitals and nursing note reviewed.  Constitutional:      General: She is not in acute distress.    Appearance: Normal appearance. She is not ill-appearing, toxic-appearing or diaphoretic.  HENT:     Head: Normocephalic.  Eyes:     Conjunctiva/sclera: Conjunctivae normal.  Cardiovascular:     Rate and Rhythm: Normal rate and regular rhythm.     Pulses: Normal pulses.  Pulmonary:     Effort: Pulmonary effort is normal. No respiratory distress.  Musculoskeletal:        General: Swelling (right ankle) and tenderness present. No deformity or signs of injury.     Cervical back: No rigidity.     Comments: No knee, foot, or hip tenderness  Skin:    General: Skin is warm and dry.      Coloration: Skin is not jaundiced or pale.  Neurological:     General: No focal deficit present.     Mental Status: She is alert and oriented to person, place, and time.    ED Results / Procedures / Treatments   Labs (all labs ordered are listed, but only abnormal results are displayed) Labs Reviewed - No data to display  EKG None  Radiology DG Ankle Complete Right  Result Date: 06/06/2021 CLINICAL DATA:  Fall.  Ankle pain. EXAM: RIGHT ANKLE - COMPLETE 3+ VIEW COMPARISON:  None FINDINGS: There is no evidence of fracture, dislocation, or joint effusion. There is no evidence of arthropathy or other focal bone abnormality. Soft tissues are unremarkable. IMPRESSION: Negative. Electronically Signed   By: Kerby Moors M.D.   On: 06/06/2021 13:05    Procedures Procedures   Medications Ordered in ED Medications - No data to display  ED Course  I have reviewed the triage vital signs and the nursing notes.  Pertinent labs & imaging results that were available during my care of the patient were reviewed by me and considered in my medical decision making (see chart for details).    MDM Rules/Calculators/A&P                           80 year old female with a history of type 2 diabetes, hypertension, hyperlipidemia, duodenal  ulcers, pacemaker, presents with concern for ankle pain after a fall. Denies other injuries, pain and low suspicion for this by history and exam.    XR right ankle without fracture. Suspect ankle sprain. Recommend weight bearing as tolerated, given ASO ankle brace. Has walker at home. Patient discharged in stable condition with understanding of reasons to return.    Final Clinical Impression(s) / ED Diagnoses Final diagnoses:  Sprain of right ankle, unspecified ligament, initial encounter    Rx / DC Orders ED Discharge Orders          Ordered    lidocaine (LIDODERM) 5 %  Every 24 hours        06/06/21 1619             Gareth Morgan,  MD 06/07/21 [phone]

## 2021-06-06 NOTE — ED Triage Notes (Signed)
Patient was walking at [place] hurt her right ankle with a mechanical fall.

## 2021-06-08 DIAGNOSIS — E0841 Diabetes mellitus due to underlying condition with diabetic mononeuropathy: Secondary | ICD-10-CM | POA: Diagnosis not present

## 2021-06-08 DIAGNOSIS — E0865 Diabetes mellitus due to underlying condition with hyperglycemia: Secondary | ICD-10-CM | POA: Diagnosis not present

## 2021-06-08 DIAGNOSIS — K3184 Gastroparesis: Secondary | ICD-10-CM | POA: Diagnosis not present

## 2021-06-08 DIAGNOSIS — R748 Abnormal levels of other serum enzymes: Secondary | ICD-10-CM | POA: Diagnosis not present

## 2021-06-08 DIAGNOSIS — E119 Type 2 diabetes mellitus without complications: Secondary | ICD-10-CM | POA: Diagnosis not present

## 2021-06-08 DIAGNOSIS — Z6821 Body mass index (BMI) 21.0-21.9, adult: Secondary | ICD-10-CM | POA: Diagnosis not present

## 2021-06-13 DIAGNOSIS — S93409A Sprain of unspecified ligament of unspecified ankle, initial encounter: Secondary | ICD-10-CM | POA: Diagnosis not present

## 2021-07-10 ENCOUNTER — Ambulatory Visit (INDEPENDENT_AMBULATORY_CARE_PROVIDER_SITE_OTHER): Payer: Medicare Other | Admitting: Cardiovascular Disease

## 2021-07-10 ENCOUNTER — Encounter: Payer: Self-pay | Admitting: Cardiovascular Disease

## 2021-07-10 ENCOUNTER — Other Ambulatory Visit: Payer: Self-pay

## 2021-07-10 VITALS — BP 147/71 | HR 73 | Ht 63.0 in | Wt 128.4 lb

## 2021-07-10 DIAGNOSIS — Z950 Presence of cardiac pacemaker: Secondary | ICD-10-CM | POA: Diagnosis not present

## 2021-07-10 DIAGNOSIS — I442 Atrioventricular block, complete: Secondary | ICD-10-CM | POA: Diagnosis not present

## 2021-07-10 DIAGNOSIS — E1140 Type 2 diabetes mellitus with diabetic neuropathy, unspecified: Secondary | ICD-10-CM | POA: Diagnosis not present

## 2021-07-10 DIAGNOSIS — I10 Essential (primary) hypertension: Secondary | ICD-10-CM | POA: Diagnosis not present

## 2021-07-10 DIAGNOSIS — Z794 Long term (current) use of insulin: Secondary | ICD-10-CM

## 2021-07-10 NOTE — Patient Instructions (Signed)

## 2021-07-13 NOTE — Progress Notes (Signed)
Cardiology office note:    Date:  07/13/2021   ID:  Judith [name], DOB [birth year], MRN [id number]  PCP:  Just, Laurita Quint, FNP (Inactive)  Cardiologist:  Sanda Klein, MD    Referring MD: Just, Laurita Quint, FNP   Chief complaint: pacemaker check  History of Present Illness:    Judith [name] is a 80 y.o. female with a hx of pacemaker implantation in [place].  She has complete heart block and is pacemaker dependent.  She moved to [place] while recovering from a fall but now is a resident at [facility] with no plans of returning to [place].    She has done quite well since her last appointment.  She has settled into [place] and has made friends.  The patient specifically denies any chest pain at rest exertion, dyspnea at rest or with exertion, orthopnea, paroxysmal nocturnal dyspnea, syncope, palpitations, focal neurological deficits, intermittent claudication, lower extremity edema, unexplained weight gain, cough, hemoptysis or wheezing.  Her blood pressure today was borderline high, but at a recent appointment it was as low as 101/63.  After starting her statin, LDL is much improved at 66.  She has satisfactory glycemic control with a hemoglobin A1c of 7.6%.  Renal function is normal with a creatinine of 1.0.  Device interrogation shows normal function.  Estimated generator longevity is 1.5 years.  She has a [employer] [id number] device that is not capable of remote downloads.  She has complete heart block and 100% ventricular pacing.  There is no underlying escape rhythm.  She only paces the atrium 9% of the time.  She has not had atrial fibrillation or high ventricular rates.  The heart rate histogram distribution is good and lead parameters are excellent.  Her device is not capable of remote monitoring.  Her initial pacemaker was implanted in 2007.  It appears that her ventricular lead is still the original one (Guidant [id number] implanted July 05, 2006).  June 09, 2010 she  underwent a pacemaker generator change out and placement of a new atrial lead (Guidant [id number]).   At one previous device check we had detected a 6-second episode of possible atrial fibrillation.  She does not have a history of coronary disease.  She reports a normal stress test in the past.  She had an echocardiogram just a few months before coming to [place] and was told that her heart pumping strength is normal.  She was told that she has a murmur which is due to a mildly leaky valve.  There is no reported history of heart failure that I am aware of.   She does not have intermittent claudication and reports that lower extremity ABIs have been performed in the past with normal results.  She has never had a stroke or TIA.  She was taking lovastatin for lipid lowering but has been off this medication for a while.  She has lost a lot of weight since then.  Past Medical History:  Diagnosis Date   Anemia    Arthritis    Biliary gastritis    Blood transfusion without reported diagnosis    2007   Cervical spine arthritis 04/05/2016   Controlled type 2 diabetes mellitus with diabetic neuropathy, with long-term current use of insulin ([place])    Depression    Diabetes mellitus type 2 with complications ([place])    Dilated bile duct    Duodenal ulcer disease    Encounter for long-term (current) use of insulin ([place])  Gallstones 01/17/2018   Gastric ulcer    Gastritis and gastroduodenitis    Gastroparesis    severe   GERD (gastroesophageal reflux disease)    Gout    tested for gout but was not   Heart murmur    Hyperlipidemia    in past not now   Hypertension    Hypokalemia, gastrointestinal losses    secondary to severe gastroparesis   Long-term insulin use in type 2 diabetes ([place]) 04/05/2016   Osteoporosis    just had bone density test possible    Peripheral neuropathy    Presence of cardiac pacemaker    S/P partial gastrectomy    due to severe gastric and gudoenal ulcers   Seasonal  allergies    Sleep apnea    could not use CPAP   UTI (urinary tract infection)     Past Surgical History:  Procedure Laterality Date   COLONOSCOPY  02/11/2018   no polyps, + small internal hemorrhoids   EUS N/A 12/12/2017   Procedure: UPPER ENDOSCOPIC ULTRASOUND (EUS) RADIAL;  Surgeon: [name], MD;  Location: [facility];  Service: Endoscopy;  Laterality: N/A;   GASTRECTOMY     JOINT REPLACEMENT     pace maker     PARTIAL GASTRECTOMY     TONSILLECTOMY     45 years ago   UPPER GASTROINTESTINAL ENDOSCOPY      Current Medications: Current Meds  Medication Sig   Biotin 1000 MCG tablet Take 1,000 mcg by mouth daily.    blood glucose meter kit and supplies KIT Dispense based on patient and insurance preference. Check capillary glucose three times a day, before meals.  Dx E11.65, Z79.4   carvedilol (COREG) 25 MG tablet Take 1 tablet (25 mg total) by mouth 2 (two) times daily with a meal.   colchicine 0.6 MG tablet Take 0.6 mg by mouth daily.    DULoxetine (CYMBALTA) 60 MG capsule TAKE 1 CAPSULE (60 MG TOTAL) DAILY BY MOUTH.   famotidine (PEPCID) 20 MG tablet Take 1 tablet (20 mg total) by mouth at bedtime.   gabapentin (NEURONTIN) 100 MG capsule Take 1 capsule (100 mg) every morning and afternoon, and take 4 capsules (442m) at bedtime   glucose blood (ONE TOUCH ULTRA TEST) test strip USE TO CHECK GLUCOSE LEVELS 3 TIMES A DAY, DX E11.9, Z79.4   HUMALOG KWIKPEN 100 UNIT/ML KwikPen CHECK FSBS BEFORE EACH MEAL AND INJECT PER SSI(0-80.0UNITS)(80-149,5U)(150-180,9U)(181-500,CALL MD)   insulin glargine (LANTUS SOLOSTAR) 100 UNIT/ML Solostar Pen Inject 37 Units into the skin daily at 10 pm.   insulin lispro (HUMALOG) 100 UNIT/ML injection Inject 1-10 Units into the skin 3 (three) times daily before meals. Sliding scale   Insulin Pen Needle (B-D ULTRAFINE III SHORT PEN) 31G X 8 MM MISC To use as directed with insulin, four times a day. Dx E11.9, Z79.4   lidocaine (LIDODERM) 5 % Place  1 patch onto the skin daily. Remove & Discard patch within 12 hours or as directed by MD   losartan (COZAAR) 100 MG tablet Take 100 mg by mouth daily.    Multiple Vitamin (MULTIVITAMIN) tablet Take 1 tablet by mouth daily.    pantoprazole (PROTONIX) 40 MG tablet TAKE 1 TABLET (40 MG TOTAL) DAILY BY MOUTH.   rosuvastatin (CRESTOR) 10 MG tablet Take 1 tablet (10 mg total) by mouth daily.     Allergies:   Asa [aspirin], Tylenol [acetaminophen], and Sulfa antibiotics   Social History   Socioeconomic History   Marital status:  Widowed    Spouse name: Not on file   Number of children: 3   Years of education: Not on file   Highest education level: High school graduate  Occupational History   Not on file  Tobacco Use   Smoking status: Never   Smokeless tobacco: Never  Vaping Use   Vaping Use: Never used  Substance and Sexual Activity   Alcohol use: No    Alcohol/week: 0.0 standard drinks   Drug use: No   Sexual activity: Not Currently  Other Topics Concern   Not on file  Social History Narrative   Used to live independently in [place]   However had significant issues with severe gastroparesis resulting in severe hypokalemia and general weakness and fall resulting in hospitalization and SNF rehab   Now (10/2017) living with her daughter in [place] until able to stabilize and regain independence.      May 2019   Patient living in assisted living, [place]   Social Determinants of Health   Financial Resource Strain: Not on file  Food Insecurity: Not on file  Transportation Needs: Not on file  Physical Activity: Not on file  Stress: Not on file  Social Connections: Not on file     Family History: The patient's family history significant for the absence of premature cardiac illness  ROS:   Please see the history of present illness.    All other systems are reviewed and are negative.   EKGs/Labs/Other Studies Reviewed:    The following studies were reviewed  today: Comprehensive pacemaker interrogation  EKG:  EKG is ordered today.  Atrial sensed, ventricular paced rhythm with very broad QRS at 174 ms.  Appropriately prolonged QTC 505 ms. Recent Labs: 11/15/2020: Hemoglobin 12.8; Platelets 335; TSH 1.700 02/14/2021: ALT 10; BUN 15; Creatinine, Ser 1.00; Potassium 4.4; Sodium 140  A1c 6.4% Recent Lipid Panel    Component Value Date/Time   CHOL 140 02/14/2021 1017   TRIG 95 02/14/2021 1017   HDL 56 02/14/2021 1017   CHOLHDL 2.5 02/14/2021 1017   LDLCALC 66 02/14/2021 1017    Physical Exam:    VS:  BP (!) 147/71   Pulse 73   Ht '5\' 3"'  (1.6 m)   Wt 128 lb 6.4 oz (58.2 kg)   SpO2 96%   BMI 22.75 kg/m     Wt Readings from Last 3 Encounters:  07/10/21 128 lb 6.4 oz (58.2 kg)  06/06/21 135 lb (61.2 kg)  02/14/21 131 lb 6.4 oz (59.6 kg)     General: Alert, oriented x3, no distress, healthy left subclavian pacemaker site Head: no evidence of trauma, PERRL, EOMI, no exophtalmos or lid lag, no myxedema, no xanthelasma; normal ears, nose and oropharynx Neck: normal jugular venous pulsations and no hepatojugular reflux; brisk carotid pulses without delay and no carotid bruits Chest: clear to auscultation, no signs of consolidation by percussion or palpation, normal fremitus, symmetrical and full respiratory excursions Cardiovascular: normal position and quality of the apical impulse, regular rhythm, normal first and paradoxically split second heart sounds, no murmurs, rubs or gallops Abdomen: no tenderness or distention, no masses by palpation, no abnormal pulsatility or arterial bruits, normal bowel sounds, no hepatosplenomegaly Extremities: no clubbing, cyanosis or edema; 2+ radial, ulnar and brachial pulses bilaterally; 2+ right femoral, posterior tibial and dorsalis pedis pulses; 2+ left femoral, posterior tibial and dorsalis pedis pulses; no subclavian or femoral bruits Neurological: grossly nonfocal Psych: Normal mood and  affect   ASSESSMENT:    1. CHB (  complete heart block) ([place])   2. Pacemaker   3. Controlled type 2 diabetes mellitus with diabetic neuropathy, with long-term current use of insulin ([place])   4. Essential hypertension    PLAN:    In order of problems listed above:  CHB: Pacemaker dependent.  Asymptomatic. PPM: Bring her back in 6 months.  When she reaches the last year of anticipated battery longevity will increase the frequency of office checks to every 3 months. DM: With multiple complications including gastroparesis and neuropathy.  Glycemic control has deteriorated compared with last year, but remains acceptable.  LDL in target range. HTN: Controlled, continue current medications avoid higher doses of diuretics due to history of pseudogout and risk of dehydration in the setting of gastroparesis.    Medication Adjustments/Labs and Tests Ordered: Current medicines are reviewed at length with the patient today.  Concerns regarding medicines are outlined above.  Orders Placed This Encounter  Procedures   EKG 12-Lead   No orders of the defined types were placed in this encounter.  Patient Instructions  Medication Instructions:  No changes *If you need a refill on your cardiac medications before your next appointment, please call your pharmacy*   Lab Work: None ordered If you have labs (blood work) drawn today and your tests are completely normal, you will receive your results only by: [place] (if you have MyChart) OR A paper copy in the mail If you have any lab test that is abnormal or we need to change your treatment, we will call you to review the results.   Testing/Procedures: None ordered   Follow-Up: At [facility], you and your health needs are our priority.  As part of our continuing mission to provide you with exceptional heart care, we have created designated Provider Care Teams.  These Care Teams include your primary Cardiologist (physician) and Advanced  Practice Providers (APPs -  Physician Assistants and Nurse Practitioners) who all work together to provide you with the care you need, when you need it.  We recommend signing up for the patient portal called "MyChart".  Sign up information is provided on this After Visit Summary.  MyChart is used to connect with patients for Virtual Visits (Telemedicine).  Patients are able to view lab/test results, encounter notes, upcoming appointments, etc.  Non-urgent messages can be sent to your provider as well.   To learn more about what you can do with MyChart, go to [email or web address].    Your next appointment:   12 month(s)  The format for your next appointment:   In Person  Provider:   Sanda Klein, MD    Signed, Sanda Klein, MD  07/13/2021 4:35 PM    [place]

## 2021-08-15 ENCOUNTER — Encounter (INDEPENDENT_AMBULATORY_CARE_PROVIDER_SITE_OTHER): Payer: Medicare Other | Admitting: Ophthalmology

## 2021-08-15 ENCOUNTER — Other Ambulatory Visit: Payer: Self-pay

## 2021-08-15 DIAGNOSIS — H26491 Other secondary cataract, right eye: Secondary | ICD-10-CM | POA: Diagnosis not present

## 2021-08-15 DIAGNOSIS — I10 Essential (primary) hypertension: Secondary | ICD-10-CM

## 2021-08-15 DIAGNOSIS — H35033 Hypertensive retinopathy, bilateral: Secondary | ICD-10-CM

## 2021-08-15 DIAGNOSIS — E113392 Type 2 diabetes mellitus with moderate nonproliferative diabetic retinopathy without macular edema, left eye: Secondary | ICD-10-CM

## 2021-08-15 DIAGNOSIS — E113311 Type 2 diabetes mellitus with moderate nonproliferative diabetic retinopathy with macular edema, right eye: Secondary | ICD-10-CM | POA: Diagnosis not present

## 2021-08-15 DIAGNOSIS — H43813 Vitreous degeneration, bilateral: Secondary | ICD-10-CM

## 2021-08-16 DIAGNOSIS — M79673 Pain in unspecified foot: Secondary | ICD-10-CM | POA: Diagnosis not present

## 2021-08-16 DIAGNOSIS — B351 Tinea unguium: Secondary | ICD-10-CM | POA: Diagnosis not present

## 2021-08-16 DIAGNOSIS — L84 Corns and callosities: Secondary | ICD-10-CM | POA: Diagnosis not present

## 2021-08-16 DIAGNOSIS — E1140 Type 2 diabetes mellitus with diabetic neuropathy, unspecified: Secondary | ICD-10-CM | POA: Diagnosis not present

## 2021-08-16 DIAGNOSIS — R2681 Unsteadiness on feet: Secondary | ICD-10-CM | POA: Diagnosis not present

## 2021-08-16 DIAGNOSIS — L605 Yellow nail syndrome: Secondary | ICD-10-CM | POA: Diagnosis not present

## 2021-09-12 DIAGNOSIS — E0841 Diabetes mellitus due to underlying condition with diabetic mononeuropathy: Secondary | ICD-10-CM | POA: Diagnosis not present

## 2021-09-12 DIAGNOSIS — E119 Type 2 diabetes mellitus without complications: Secondary | ICD-10-CM | POA: Diagnosis not present

## 2021-09-12 DIAGNOSIS — K3184 Gastroparesis: Secondary | ICD-10-CM | POA: Diagnosis not present

## 2021-09-12 DIAGNOSIS — R748 Abnormal levels of other serum enzymes: Secondary | ICD-10-CM | POA: Diagnosis not present

## 2021-09-12 DIAGNOSIS — Z6821 Body mass index (BMI) 21.0-21.9, adult: Secondary | ICD-10-CM | POA: Diagnosis not present

## 2021-09-12 DIAGNOSIS — E0865 Diabetes mellitus due to underlying condition with hyperglycemia: Secondary | ICD-10-CM | POA: Diagnosis not present

## 2021-09-12 DIAGNOSIS — H3523 Other non-diabetic proliferative retinopathy, bilateral: Secondary | ICD-10-CM | POA: Diagnosis not present

## 2021-09-14 ENCOUNTER — Other Ambulatory Visit: Payer: Self-pay

## 2021-09-14 ENCOUNTER — Encounter (INDEPENDENT_AMBULATORY_CARE_PROVIDER_SITE_OTHER): Payer: Medicare Other | Admitting: Ophthalmology

## 2021-09-14 DIAGNOSIS — H26491 Other secondary cataract, right eye: Secondary | ICD-10-CM | POA: Diagnosis not present

## 2021-09-29 DIAGNOSIS — Z23 Encounter for immunization: Secondary | ICD-10-CM | POA: Diagnosis not present

## 2021-10-03 DIAGNOSIS — Z23 Encounter for immunization: Secondary | ICD-10-CM | POA: Diagnosis not present

## 2021-10-24 ENCOUNTER — Other Ambulatory Visit: Payer: Self-pay

## 2021-10-24 ENCOUNTER — Encounter (INDEPENDENT_AMBULATORY_CARE_PROVIDER_SITE_OTHER): Payer: Medicare Other | Admitting: Ophthalmology

## 2021-10-24 DIAGNOSIS — H35033 Hypertensive retinopathy, bilateral: Secondary | ICD-10-CM

## 2021-10-24 DIAGNOSIS — E113392 Type 2 diabetes mellitus with moderate nonproliferative diabetic retinopathy without macular edema, left eye: Secondary | ICD-10-CM | POA: Diagnosis not present

## 2021-10-24 DIAGNOSIS — H43813 Vitreous degeneration, bilateral: Secondary | ICD-10-CM | POA: Diagnosis not present

## 2021-10-24 DIAGNOSIS — I10 Essential (primary) hypertension: Secondary | ICD-10-CM

## 2021-10-24 DIAGNOSIS — E113311 Type 2 diabetes mellitus with moderate nonproliferative diabetic retinopathy with macular edema, right eye: Secondary | ICD-10-CM | POA: Diagnosis not present

## 2021-10-25 DIAGNOSIS — L851 Acquired keratosis [keratoderma] palmaris et plantaris: Secondary | ICD-10-CM | POA: Diagnosis not present

## 2021-10-25 DIAGNOSIS — R2681 Unsteadiness on feet: Secondary | ICD-10-CM | POA: Diagnosis not present

## 2021-10-25 DIAGNOSIS — B351 Tinea unguium: Secondary | ICD-10-CM | POA: Diagnosis not present

## 2021-10-25 DIAGNOSIS — L603 Nail dystrophy: Secondary | ICD-10-CM | POA: Diagnosis not present

## 2021-10-25 DIAGNOSIS — M79674 Pain in right toe(s): Secondary | ICD-10-CM | POA: Diagnosis not present

## 2021-10-25 DIAGNOSIS — E1142 Type 2 diabetes mellitus with diabetic polyneuropathy: Secondary | ICD-10-CM | POA: Diagnosis not present

## 2021-11-28 ENCOUNTER — Inpatient Hospital Stay (HOSPITAL_COMMUNITY)
Admission: EM | Admit: 2021-11-28 | Discharge: 2021-12-11 | DRG: 418 | Disposition: A | Discharge status: Skilled Nursing Facility | Payer: Medicare Other | Source: Skilled Nursing Facility | Attending: Internal Medicine | Admitting: Internal Medicine

## 2021-11-28 ENCOUNTER — Other Ambulatory Visit: Payer: Self-pay

## 2021-11-28 ENCOUNTER — Encounter (HOSPITAL_COMMUNITY): Payer: Self-pay

## 2021-11-28 ENCOUNTER — Emergency Department (HOSPITAL_COMMUNITY): Payer: Medicare Other

## 2021-11-28 DIAGNOSIS — Z8249 Family history of ischemic heart disease and other diseases of the circulatory system: Secondary | ICD-10-CM

## 2021-11-28 DIAGNOSIS — Z79899 Other long term (current) drug therapy: Secondary | ICD-10-CM

## 2021-11-28 DIAGNOSIS — K8012 Calculus of gallbladder with acute and chronic cholecystitis without obstruction: Secondary | ICD-10-CM | POA: Diagnosis not present

## 2021-11-28 DIAGNOSIS — K8080 Other cholelithiasis without obstruction: Secondary | ICD-10-CM | POA: Diagnosis not present

## 2021-11-28 DIAGNOSIS — Y92239 Unspecified place in hospital as the place of occurrence of the external cause: Secondary | ICD-10-CM | POA: Diagnosis not present

## 2021-11-28 DIAGNOSIS — K8066 Calculus of gallbladder and bile duct with acute and chronic cholecystitis without obstruction: Secondary | ICD-10-CM | POA: Diagnosis not present

## 2021-11-28 DIAGNOSIS — E1165 Type 2 diabetes mellitus with hyperglycemia: Secondary | ICD-10-CM

## 2021-11-28 DIAGNOSIS — M810 Age-related osteoporosis without current pathological fracture: Secondary | ICD-10-CM | POA: Diagnosis present

## 2021-11-28 DIAGNOSIS — K8050 Calculus of bile duct without cholangitis or cholecystitis without obstruction: Secondary | ICD-10-CM | POA: Diagnosis not present

## 2021-11-28 DIAGNOSIS — R1111 Vomiting without nausea: Secondary | ICD-10-CM | POA: Diagnosis not present

## 2021-11-28 DIAGNOSIS — I10 Essential (primary) hypertension: Secondary | ICD-10-CM | POA: Diagnosis present

## 2021-11-28 DIAGNOSIS — I442 Atrioventricular block, complete: Secondary | ICD-10-CM | POA: Diagnosis not present

## 2021-11-28 DIAGNOSIS — R112 Nausea with vomiting, unspecified: Secondary | ICD-10-CM | POA: Diagnosis not present

## 2021-11-28 DIAGNOSIS — Z20822 Contact with and (suspected) exposure to covid-19: Secondary | ICD-10-CM | POA: Diagnosis not present

## 2021-11-28 DIAGNOSIS — R531 Weakness: Secondary | ICD-10-CM | POA: Diagnosis not present

## 2021-11-28 DIAGNOSIS — R739 Hyperglycemia, unspecified: Secondary | ICD-10-CM | POA: Diagnosis not present

## 2021-11-28 DIAGNOSIS — M109 Gout, unspecified: Secondary | ICD-10-CM | POA: Diagnosis present

## 2021-11-28 DIAGNOSIS — E1142 Type 2 diabetes mellitus with diabetic polyneuropathy: Secondary | ICD-10-CM | POA: Diagnosis not present

## 2021-11-28 DIAGNOSIS — K5710 Diverticulosis of small intestine without perforation or abscess without bleeding: Secondary | ICD-10-CM | POA: Diagnosis present

## 2021-11-28 DIAGNOSIS — L270 Generalized skin eruption due to drugs and medicaments taken internally: Secondary | ICD-10-CM | POA: Diagnosis not present

## 2021-11-28 DIAGNOSIS — K82A1 Gangrene of gallbladder in cholecystitis: Secondary | ICD-10-CM | POA: Diagnosis present

## 2021-11-28 DIAGNOSIS — R109 Unspecified abdominal pain: Secondary | ICD-10-CM | POA: Diagnosis not present

## 2021-11-28 DIAGNOSIS — Z833 Family history of diabetes mellitus: Secondary | ICD-10-CM

## 2021-11-28 DIAGNOSIS — G629 Polyneuropathy, unspecified: Secondary | ICD-10-CM

## 2021-11-28 DIAGNOSIS — K2950 Unspecified chronic gastritis without bleeding: Secondary | ICD-10-CM | POA: Diagnosis present

## 2021-11-28 DIAGNOSIS — Z903 Acquired absence of stomach [part of]: Secondary | ICD-10-CM

## 2021-11-28 DIAGNOSIS — G4730 Sleep apnea, unspecified: Secondary | ICD-10-CM | POA: Insufficient documentation

## 2021-11-28 DIAGNOSIS — M549 Dorsalgia, unspecified: Secondary | ICD-10-CM | POA: Diagnosis not present

## 2021-11-28 DIAGNOSIS — R110 Nausea: Secondary | ICD-10-CM | POA: Diagnosis not present

## 2021-11-28 DIAGNOSIS — E876 Hypokalemia: Secondary | ICD-10-CM | POA: Diagnosis not present

## 2021-11-28 DIAGNOSIS — D72829 Elevated white blood cell count, unspecified: Secondary | ICD-10-CM | POA: Diagnosis not present

## 2021-11-28 DIAGNOSIS — Z934 Other artificial openings of gastrointestinal tract status: Secondary | ICD-10-CM | POA: Diagnosis not present

## 2021-11-28 DIAGNOSIS — L859 Epidermal thickening, unspecified: Secondary | ICD-10-CM | POA: Diagnosis present

## 2021-11-28 DIAGNOSIS — K5900 Constipation, unspecified: Secondary | ICD-10-CM | POA: Diagnosis not present

## 2021-11-28 DIAGNOSIS — K219 Gastro-esophageal reflux disease without esophagitis: Secondary | ICD-10-CM | POA: Diagnosis present

## 2021-11-28 DIAGNOSIS — R7989 Other specified abnormal findings of blood chemistry: Secondary | ICD-10-CM | POA: Diagnosis not present

## 2021-11-28 DIAGNOSIS — K319 Disease of stomach and duodenum, unspecified: Secondary | ICD-10-CM | POA: Diagnosis present

## 2021-11-28 DIAGNOSIS — I700 Atherosclerosis of aorta: Secondary | ICD-10-CM | POA: Diagnosis present

## 2021-11-28 DIAGNOSIS — D649 Anemia, unspecified: Secondary | ICD-10-CM | POA: Diagnosis present

## 2021-11-28 DIAGNOSIS — T368X5A Adverse effect of other systemic antibiotics, initial encounter: Secondary | ICD-10-CM | POA: Diagnosis not present

## 2021-11-28 DIAGNOSIS — Z950 Presence of cardiac pacemaker: Secondary | ICD-10-CM | POA: Diagnosis present

## 2021-11-28 DIAGNOSIS — R1084 Generalized abdominal pain: Secondary | ICD-10-CM | POA: Diagnosis not present

## 2021-11-28 DIAGNOSIS — Z881 Allergy status to other antibiotic agents status: Secondary | ICD-10-CM

## 2021-11-28 DIAGNOSIS — Z8711 Personal history of peptic ulcer disease: Secondary | ICD-10-CM

## 2021-11-28 DIAGNOSIS — F32A Depression, unspecified: Secondary | ICD-10-CM | POA: Diagnosis present

## 2021-11-28 DIAGNOSIS — Z794 Long term (current) use of insulin: Secondary | ICD-10-CM

## 2021-11-28 DIAGNOSIS — D751 Secondary polycythemia: Secondary | ICD-10-CM | POA: Insufficient documentation

## 2021-11-28 DIAGNOSIS — Z886 Allergy status to analgesic agent status: Secondary | ICD-10-CM

## 2021-11-28 DIAGNOSIS — E785 Hyperlipidemia, unspecified: Secondary | ICD-10-CM | POA: Insufficient documentation

## 2021-11-28 LAB — CBC WITH DIFFERENTIAL/PLATELET
Abs Immature Granulocytes: 0.06 10*3/uL (ref 0.00–0.07)
Basophils Absolute: 0.1 10*3/uL (ref 0.0–0.1)
Basophils Relative: 0 %
Eosinophils Absolute: 0.1 10*3/uL (ref 0.0–0.5)
Eosinophils Relative: 1 %
HCT: 48.3 % — ABNORMAL HIGH (ref 36.0–46.0)
Hemoglobin: 16 g/dL — ABNORMAL HIGH (ref 12.0–15.0)
Immature Granulocytes: 0 %
Lymphocytes Relative: 8 %
Lymphs Abs: 1.2 10*3/uL (ref 0.7–4.0)
MCH: 30.6 pg (ref 26.0–34.0)
MCHC: 33.1 g/dL (ref 30.0–36.0)
MCV: 92.4 fL (ref 80.0–100.0)
Monocytes Absolute: 0.3 10*3/uL (ref 0.1–1.0)
Monocytes Relative: 2 %
Neutro Abs: 13.6 10*3/uL — ABNORMAL HIGH (ref 1.7–7.7)
Neutrophils Relative %: 89 %
Platelets: 283 10*3/uL (ref 150–400)
RBC: 5.23 MIL/uL — ABNORMAL HIGH (ref 3.87–5.11)
RDW: 14 % (ref 11.5–15.5)
WBC: 15.3 10*3/uL — ABNORMAL HIGH (ref 4.0–10.5)
nRBC: 0 % (ref 0.0–0.2)

## 2021-11-28 LAB — COMPREHENSIVE METABOLIC PANEL
ALT: 14 U/L (ref 0–44)
AST: 33 U/L (ref 15–41)
Albumin: 4.7 g/dL (ref 3.5–5.0)
Alkaline Phosphatase: 66 U/L (ref 38–126)
Anion gap: 15 (ref 5–15)
BUN: 14 mg/dL (ref 8–23)
CO2: 24 mmol/L (ref 22–32)
Calcium: 9.7 mg/dL (ref 8.9–10.3)
Chloride: 94 mmol/L — ABNORMAL LOW (ref 98–111)
Creatinine, Ser: 0.79 mg/dL (ref 0.44–1.00)
GFR, Estimated: >60 mL/min (ref >60)
Glucose, Bld: 296 mg/dL — ABNORMAL HIGH (ref 70–99)
Potassium: 4.9 mmol/L (ref 3.5–5.1)
Sodium: 133 mmol/L — ABNORMAL LOW (ref 135–145)
Total Bilirubin: 1.5 mg/dL — ABNORMAL HIGH (ref 0.3–1.2)
Total Protein: 8.3 g/dL — ABNORMAL HIGH (ref 6.5–8.1)

## 2021-11-28 LAB — I-STAT CHEM 8, ED
BUN: 16 mg/dL (ref 8–23)
Calcium, Ion: 1.09 mmol/L — ABNORMAL LOW (ref 1.15–1.40)
Chloride: 97 mmol/L — ABNORMAL LOW (ref 98–111)
Creatinine, Ser: 0.7 mg/dL (ref 0.44–1.00)
Glucose, Bld: 296 mg/dL — ABNORMAL HIGH (ref 70–99)
HCT: 50 % — ABNORMAL HIGH (ref 36.0–46.0)
Hemoglobin: 17 g/dL — ABNORMAL HIGH (ref 12.0–15.0)
Potassium: 4.9 mmol/L (ref 3.5–5.1)
Sodium: 135 mmol/L (ref 135–145)
TCO2: 28 mmol/L (ref 22–32)

## 2021-11-28 LAB — CBG MONITORING, ED: Glucose-Capillary: 274 mg/dL — ABNORMAL HIGH (ref 70–99)

## 2021-11-28 LAB — LIPASE, BLOOD: Lipase: 19 U/L (ref 11–51)

## 2021-11-28 MED ORDER — OXYCODONE HCL 5 MG PO TABS
5.0000 mg | ORAL_TABLET | ORAL | Status: AC | PRN
  Administered 2021-11-29 (×2): 5 mg via ORAL
  Filled 2021-11-28 (×2): qty 1

## 2021-11-28 MED ORDER — ONDANSETRON HCL 4 MG/2ML IJ SOLN
4.0000 mg | Freq: Four times a day (QID) | INTRAMUSCULAR | Status: DC | PRN
  Administered 2021-11-28 – 2021-12-02 (×6): 4 mg via INTRAVENOUS
  Filled 2021-11-28 (×7): qty 2

## 2021-11-28 MED ORDER — MELATONIN 3 MG PO TABS
3.0000 mg | ORAL_TABLET | Freq: Every day | ORAL | Status: DC
  Administered 2021-11-28 – 2021-11-29 (×2): 3 mg via ORAL
  Filled 2021-11-28 (×3): qty 1

## 2021-11-28 MED ORDER — HYDROMORPHONE HCL 1 MG/ML IJ SOLN
0.5000 mg | INTRAMUSCULAR | Status: DC | PRN
  Administered 2021-11-28 – 2021-12-02 (×13): 0.5 mg via INTRAVENOUS
  Filled 2021-11-28: qty 1
  Filled 2021-11-28 (×13): qty 0.5

## 2021-11-28 MED ORDER — ONDANSETRON HCL 4 MG PO TABS
4.0000 mg | ORAL_TABLET | Freq: Four times a day (QID) | ORAL | Status: DC | PRN
  Administered 2021-11-29: 4 mg via ORAL
  Filled 2021-11-28 (×2): qty 1

## 2021-11-28 MED ORDER — METOCLOPRAMIDE HCL 5 MG/ML IJ SOLN
5.0000 mg | Freq: Once | INTRAMUSCULAR | Status: AC
  Administered 2021-11-28: 5 mg via INTRAVENOUS
  Filled 2021-11-28: qty 2

## 2021-11-28 MED ORDER — IOHEXOL 350 MG/ML SOLN
80.0000 mL | Freq: Once | INTRAVENOUS | Status: AC | PRN
  Administered 2021-11-28: 80 mL via INTRAVENOUS

## 2021-11-28 MED ORDER — PANTOPRAZOLE SODIUM 40 MG IV SOLR
40.0000 mg | INTRAVENOUS | Status: DC
  Administered 2021-11-28: 40 mg via INTRAVENOUS

## 2021-11-28 MED ORDER — LABETALOL HCL 5 MG/ML IV SOLN: 20.0000 mg | INTRAVENOUS | Status: DC | PRN

## 2021-11-28 MED ORDER — CARVEDILOL 25 MG PO TABS
25.0000 mg | ORAL_TABLET | Freq: Two times a day (BID) | ORAL | Status: DC
  Administered 2021-11-28 – 2021-12-11 (×25): 25 mg via ORAL
  Filled 2021-11-28 (×26): qty 1

## 2021-11-28 MED ORDER — ONDANSETRON HCL 4 MG/2ML IJ SOLN: 4.0000 mg | Freq: Once | INTRAMUSCULAR | Status: DC

## 2021-11-28 MED ORDER — SODIUM CHLORIDE (PF) 0.9 % IJ SOLN
INTRAMUSCULAR | Status: AC
  Filled 2021-11-28: qty 50

## 2021-11-28 MED ORDER — DIPHENHYDRAMINE HCL 50 MG/ML IJ SOLN
12.5000 mg | Freq: Once | INTRAMUSCULAR | Status: AC
  Administered 2021-11-28: 12.5 mg via INTRAVENOUS
  Filled 2021-11-28: qty 1

## 2021-11-28 MED ORDER — LACTATED RINGERS IV SOLN: INTRAVENOUS | Status: DC

## 2021-11-28 NOTE — Progress Notes (Signed)
[facility]  11/28/2021 4:32 PM  Called by the ER physician in regards to this patient with choledocholithiasis, patient of Dr. Vena Rua.  Discussed with Dr. Loletha Carrow. I have made the patient n.p.o. at midnight in case we are able to do ERCP tomorrow and I will do a formal consult in the morning. Continue to monitor LFT's overnight.  Ellouise Newer, PA-C

## 2021-11-28 NOTE — ED Provider Notes (Signed)
4:35 PM Care of the patient assumed at signout.  Now on evaluation she is awake, alert, relatively comfortable.  She is aware of all findings.  I discussed them with our GI colleagues, from [facility].  Patient will be admitted to the hospitalist team, with GI consultation.    Carmin Muskrat, MD 11/28/21 2308

## 2021-11-28 NOTE — ED Notes (Signed)
Family updated as to patient's status. (Daughter: [name]: [phone])

## 2021-11-28 NOTE — H&P (Signed)
History and Physical    Judith [name] OVZ:[id number] DOB: November 03, 1941 DOA: 11/28/2021  PCP: Janie Morning, DO   Patient coming from: Home.  [facility].  I have personally briefly reviewed patient's old medical records in [place]  Chief Complaint: Abdominal pain.  HPI: Judith [name] is a 80 y.o. female with medical history significant of normocytic anemia, osteoarthritis, biliary gastritis, cervical spine DDD, controlled type II DM with neuropathy, depression, gout, hyperlipidemia, hypertension, osteoporosis, seasonal allergies, sleep apnea not on CPAP, history of UTI, history of partial gastrectomy, biliary gastritis, GERD, gastroparesis, duodenal and gastric ulcers, history of gallstones, dilated bile duct who is coming to the emergency department due to multiple episodes of emesis today since he woke up with abdominal pain and nausea about 0500 today.  ED Course: Initial vital signs were temperature 97.2 F, pulse 74, respiration 18, BP 189/78 mmHg O2 sat 99% on room air.  The patient received Benadryl 12.5 mg IVP x1 and metoclopramide 5 mg IVP x1.  Lab work: CBC showed a white count of 15.3 with 89% neutrophils, hemoglobin 16.0 g/dL platelets 283.  Lipase was normal.  CMP showed a sodium of 133 and chloride of 94 mmol/L.  Glucose 296 and total bilirubin 1.5 mg/dL.  Total protein was 8.3 g/dL.  Renal function, the rest of the electrolytes and hepatic function tests were unremarkable.  Imaging: CT abdomen/pelvis with contrast showed cholelithiasis and choledocholithiasis up to 1.1 cm.  There was aortic atherosclerosis.  Please see images and full radiology report for further details.  Review of Systems: As per HPI otherwise all other systems reviewed and are negative.  Past Medical History:  Diagnosis Date   Anemia    Arthritis    Biliary gastritis    Blood transfusion without reported diagnosis    2007   Cervical spine arthritis 04/05/2016   Controlled  type 2 diabetes mellitus with diabetic neuropathy, with long-term current use of insulin ([place])    Depression    Diabetes mellitus type 2 with complications ([place])    Dilated bile duct    Duodenal ulcer disease    Encounter for long-term (current) use of insulin ([place])    Gallstones 01/17/2018   Gastric ulcer    Gastritis and gastroduodenitis    Gastroparesis    severe   GERD (gastroesophageal reflux disease)    Gout    tested for gout but was not   Heart murmur    Hyperlipidemia    in past not now   Hypertension    Hypokalemia, gastrointestinal losses    secondary to severe gastroparesis   Long-term insulin use in type 2 diabetes ([place]) 04/05/2016   Osteoporosis    just had bone density test possible    Peripheral neuropathy    Presence of cardiac pacemaker    S/P partial gastrectomy    due to severe gastric and gudoenal ulcers   Seasonal allergies    Sleep apnea    could not use CPAP   UTI (urinary tract infection)     Past Surgical History:  Procedure Laterality Date   COLONOSCOPY  02/11/2018   no polyps, + small internal hemorrhoids   EUS N/A 12/12/2017   Procedure: UPPER ENDOSCOPIC ULTRASOUND (EUS) RADIAL;  Surgeon: [name], MD;  Location: [facility];  Service: Endoscopy;  Laterality: N/A;   GASTRECTOMY     JOINT REPLACEMENT     pace maker     PARTIAL GASTRECTOMY     TONSILLECTOMY  19 years ago   UPPER GASTROINTESTINAL ENDOSCOPY      Social History  reports that she has never smoked. She has never used smokeless tobacco. She reports that she does not drink alcohol and does not use drugs.  Allergies  Allergen Reactions   Asa [Aspirin] Other (See Comments)    Causes ulcers    Tylenol [Acetaminophen]     Intolerance due to liver damage   Sulfa Antibiotics Rash    Family History  Problem Relation Age of Onset   Diabetes Mother    Diabetes Father    Heart disease Father    Diabetes Sister    Heart disease Son    Colon cancer Neg Hx    Colon  polyps Neg Hx    Rectal cancer Neg Hx    Stomach cancer Neg Hx    Esophageal cancer Neg Hx    Prior to Admission medications   Medication Sig Start Date End Date Taking? Authorizing Provider  Biotin 1000 MCG tablet Take 1,000 mcg by mouth daily.     [provider]  blood glucose meter kit and supplies KIT Dispense based on patient and insurance preference. Check capillary glucose three times a day, before meals.  Dx E11.65, Z79.4 10/29/17   Jacelyn Pi, Lilia Argue, MD  carvedilol (COREG) 25 MG tablet Take 1 tablet (25 mg total) by mouth 2 (two) times daily with a meal. 01/22/19   Jacelyn Pi, Lilia Argue, MD  colchicine 0.6 MG tablet Take 0.6 mg by mouth daily.     [provider]  DULoxetine (CYMBALTA) 60 MG capsule TAKE 1 CAPSULE (60 MG TOTAL) DAILY BY MOUTH. 04/28/18   Jacelyn Pi, Lilia Argue, MD  famotidine (PEPCID) 20 MG tablet Take 1 tablet (20 mg total) by mouth at bedtime. 06/11/19   Jacelyn Pi, Lilia Argue, MD  gabapentin (NEURONTIN) 100 MG capsule Take 1 capsule (100 mg) every morning and afternoon, and take 4 capsules (429m) at bedtime 04/03/18   SJacelyn Pi ILilia Argue MD  glucose blood (ONE TOUCH ULTRA TEST) test strip USE TO CHECK GLUCOSE LEVELS 3 TIMES A DAY, DX E11.9, Z79.4 11/30/17   SJacelyn Pi ILilia Argue MD  [facility]/ML KwikPen CHECK FSBS BEFORE [facility]ME[facility] AND INJECT PER SSI(0-80.0UNITS)(80-149,5U)(150-180,9U)(181-500,CALL MD) 09/16/19   SJacelyn Pi ILilia Argue MD  insulin glargine (LANTUS SOLOSTAR) 100 UNIT/ML Solostar Pen Inject 37 Units into the skin daily at 10 pm. 08/23/20   SJacelyn Pi ILilia Argue MD  insulin lispro (HUMALOG) 100 UNIT/ML injection Inject 1-10 Units into the skin 3 (three) times daily before meals. Sliding scale    [provider]  Insulin Pen Needle (B-D ULTRAFINE III SHORT PEN) 31G X 8 MM MISC To use as directed with insulin, four times a day. Dx E11.9, Z79.4 11/08/17   SJacelyn Pi ILilia Argue MD  lidocaine (LIDODERM) 5 % Place 1  patch onto the skin daily. Remove & Discard patch within 12 hours or as directed by MD 06/06/21   SGareth Morgan MD  losartan (COZAAR) 100 MG tablet Take 100 mg by mouth daily.     [provider]  Multiple Vitamin (MULTIVITAMIN) tablet Take 1 tablet by mouth daily.     [provider]  pantoprazole (PROTONIX) 40 MG tablet TAKE 1 TABLET (40 MG TOTAL) DAILY BY MOUTH. 04/28/18   SJacelyn Pi ILilia Argue MD  rosuvastatin (CRESTOR) 10 MG tablet Take 1 tablet (10 mg total) by mouth daily. 11/15/20   Just, KLaurita Quint  FNP   Physical Exam: Vitals:   11/28/21 1500 11/28/21 1515 11/28/21 1545 11/28/21 1600  BP: (!) 167/75 (!) 179/70 (!) 168/81 (!) 105/95  Pulse: 68 64 88 92  Resp: 18   16  Temp:      TempSrc:      SpO2: 98% 98% 94% 100%  Weight:      Height:       Constitutional: Chronically ill-appearing.  NAD, calm, comfortable Eyes: PERRL, lids and conjunctivae normal.  No icterus. ENMT: Mucous membranes are mildly dry.  Posterior pharynx clear of any exudate or lesions. Neck: normal, supple, no masses, no thyromegaly Respiratory: Decreased breath sounds in bases, otherwise clear to auscultation bilaterally, no wheezing, no crackles. Normal respiratory effort. No accessory muscle use.  Cardiovascular: Regular rate and rhythm, no murmurs / rubs / gallops. No extremity edema. 2+ pedal pulses. No carotid bruits.  Abdomen: No distention.  Surgical scars.  Soft, positive LUQ and epigastric tenderness, no masses palpated. No hepatosplenomegaly. Bowel sounds positive.  Musculoskeletal: no clubbing / cyanosis.  Good ROM, no contractures. Normal muscle tone.  Skin: Hyperkeratosis with desquamation on the upper back. Neurologic: CN 2-12 grossly intact. Sensation intact, DTR normal. Strength 5/5 in all 4.  Psychiatric: Normal judgment and insight. Alert and oriented x 3. Normal mood.   Labs on Admission: I have personally reviewed following labs and imaging studies  CBC: Recent Labs   Lab 11/28/21 1417 11/28/21 1439  WBC 15.3*  --   NEUTROABS 13.6*  --   HGB 16.0* 17.0*  HCT 48.3* 50.0*  MCV 92.4  --   PLT 283  --     Basic Metabolic Panel: Recent Labs  Lab 11/28/21 1417 11/28/21 1439  NA 133* 135  K 4.9 4.9  CL 94* 97*  CO2 24  --   GLUCOSE 296* 296*  BUN 14 16  CREATININE 0.79 0.70  CALCIUM 9.7  --     GFR: Estimated Creatinine Clearance: 45.6 mL/min (by C-G formula based on SCr of 0.7 mg/dL).  Liver Function Tests: Recent Labs  Lab 11/28/21 1417  AST 33  ALT 14  ALKPHOS 66  BILITOT 1.5*  PROT 8.3*  ALBUMIN 4.7   Radiological Exams on Admission: CT ABDOMEN PELVIS W CONTRAST  Result Date: 11/28/2021 CLINICAL DATA:  Nonlocalized acute abdominal pain. EXAM: CT ABDOMEN AND PELVIS WITH CONTRAST TECHNIQUE: Multidetector CT imaging of the abdomen and pelvis was performed using the standard protocol following bolus administration of intravenous contrast. CONTRAST:  7m OMNIPAQUE IOHEXOL 350 MG/ML SOLN COMPARISON:  None. FINDINGS: Lower chest: Partially visualized cardiac leads. Query mitral annular calcifications. No acute abnormality. Tiny hiatal hernia. Hepatobiliary: No focal liver abnormality. Calcified gallstone noted within the gallbladder lumen. No gallbladder wall thickening or pericholecystic fluid. The main pancreatic duct is enlarged in caliber measuring up to 1.3 cm with several gallstone noted within its lumen (largest measuring up to 1.1 cm). Pancreas: No focal lesion. Normal pancreatic contour. No surrounding inflammatory changes. No main pancreatic ductal dilatation. Spleen: Normal in size without focal abnormality. Adrenals/Urinary Tract: No adrenal nodule bilaterally. Bilateral kidneys enhance symmetrically. Bilateral renal cortical scarring. Fluid density lesion within left kidney likely represents a simple renal cyst and measures up to 1.8 cm. Subcentimeter hypodensities are too small to characterize. No hydronephrosis. No hydroureter.  The urinary bladder is unremarkable. On delayed imaging, there is no urothelial wall thickening and there are no filling defects in the opacified portions of the bilateral collecting systems or ureters. Stomach/Bowel: Partial gastrectomy  surgical changes noted. Stomach is within normal limits. No evidence of bowel wall thickening or dilatation. Appendix appears normal. Vascular/Lymphatic: No abdominal aorta or iliac aneurysm. Severe atherosclerotic plaque of the aorta and its branches. No abdominal, pelvic, or inguinal lymphadenopathy. Reproductive: Uterus and bilateral adnexa are unremarkable. Other: No intraperitoneal free fluid. No intraperitoneal free gas. No organized fluid collection. Musculoskeletal: No abdominal wall hernia or abnormality. No suspicious lytic or blastic osseous lesions. No acute displaced fracture. Levoscoliosis of the thoracolumbar spine. Multilevel degenerative changes of the spine. IMPRESSION: 1. Cholelithiasis and choledocholithiasis (up to 1.1cm). 2.  Aortic Atherosclerosis (ICD10-I70.0) - severe. Electronically Signed   By: Iven Finn M.D.   On: 11/28/2021 15:51    EKG: Independently reviewed.   Assessment/Plan Principal Problem:   Choledocholithiasis Observation/telemetry. Keep NPO. Continue IVF. Analgesics as needed. Antiemetics as needed. Protonix 40 mg IVP every 24 hours. Follow-up CBC and CMP in the morning. [facility] will evaluate in a.m. for possible ERCP.  Active Problems:   Peripheral neuropathy Continue gabapentin 100 mg p.o. twice daily Continue gabapentin 400 mg p.o. bedtime.    Hypertension Continue carvedilol 25 mg p.o. twice daily. Labetalol 20 mg every 2 hours as needed. Monitor blood pressure and heart rate.    Gout On colchicine. Currently asymptomatic.    Sleep apnea Not on CPAP.    Hyperlipidemia/Aortic atherosclerosis Currently NPO. Will hold statin.    Type 2 diabetes mellitus with hyperglycemia (HCC) Currently  NPO. Continue IV fluids. Check CBG.    Polycythemia Check H&H after hydration       DVT prophylaxis: Lovenox SQ. Code Status:   Full code. Family Communication:   Disposition Plan:   Patient is from:  Home.  Anticipated DC to:  Home.  Anticipated DC date:  11/30/2021.  Anticipated DC barriers: Clinical status.  Consults called:  [facility] will see in AM. Admission status:  Telemetry/observation.  Severity of Illness:  High severity in the setting of new quality abdominal pain due to CBD dilatation secondary to choledocholithiasis.  The patient will remain in the hospital for IV hydration, symptoms management and evaluation by GI in AM.  Reubin Milan MD [facility]  How to contact the [facility] Attending or Consulting provider [place] or covering provider during after hours [place], for this patient?   Check the care team in [facility] and look for a) attending/consulting [facility] provider listed and b) the [facility] team listed Log into [email or web address] and use [place]'s universal password to access. If you do not have the password, please contact the hospital operator. Locate the [facility] provider you are looking for under [facility] and page to a number that you can be directly reached. If you still have difficulty reaching the provider, please page the [facility] (Director on Call) for the Hospitalists listed on amion for assistance.  11/28/2021, 4:56 PM   This document was prepared using Dragon voice recognition software and may contain some unintended transcription errors.

## 2021-11-28 NOTE — ED Provider Notes (Signed)
[place] DEPT Provider Note   CSN: [id number] Arrival date & time: 11/28/21  1333     History Chief Complaint  Patient presents with   Abdominal Pain   Emesis    Judith [name] is a 80 y.o. female with PMHx of type 2 diabetes, GERD, gastroparesis, gastroduodenitis, gastric ulcers and gastritis status post partial gastrectomy, duodenal ulcer disease, who presents today from [facility] assisted living facility for complaints of nausea and vomiting since today.  She estimates greater than 3 episodes of vomiting today.  She denies any diarrhea or abdominal pain.  Reports that yesterday was a normal day for her and she is able to eat without any difficulties.  She is unsure if there are any sick contacts at her facility.  She has been unable to keep any food down today.  History is disseminated today given her discomfort.     Past Medical History:  Diagnosis Date   Anemia    Arthritis    Biliary gastritis    Blood transfusion without reported diagnosis    2007   Cervical spine arthritis 04/05/2016   Controlled type 2 diabetes mellitus with diabetic neuropathy, with long-term current use of insulin ([place])    Depression    Diabetes mellitus type 2 with complications ([place])    Dilated bile duct    Duodenal ulcer disease    Encounter for long-term (current) use of insulin ([place])    Gallstones 01/17/2018   Gastric ulcer    Gastritis and gastroduodenitis    Gastroparesis    severe   GERD (gastroesophageal reflux disease)    Gout    tested for gout but was not   Heart murmur    Hyperlipidemia    in past not now   Hypertension    Hypokalemia, gastrointestinal losses    secondary to severe gastroparesis   Long-term insulin use in type 2 diabetes ([place]) 04/05/2016   Osteoporosis    just had bone density test possible    Peripheral neuropathy    Presence of cardiac pacemaker    S/P partial gastrectomy    due to severe gastric and gudoenal ulcers   Seasonal  allergies    Sleep apnea    could not use CPAP   UTI (urinary tract infection)     Patient Active Problem List   Diagnosis Date Noted   Unspecified inflammatory spondylopathy, cervical region ([place]) 11/15/2020   Multilevel degenerative disc disease 04/03/2018   Acute blood loss anemia 01/17/2018   Gallstones 01/17/2018   Weight loss, unintentional 01/17/2018   Dilated bile duct    Elevated liver function tests    Gastritis and gastroduodenitis    S/P partial gastrectomy    Peripheral neuropathy    Hypertension    Gastroparesis    Duodenal ulcer disease    Controlled type 2 diabetes mellitus with diabetic neuropathy, with long-term current use of insulin (HCC)    Depression    Biliary gastritis    Seasonal allergies    Presence of cardiac pacemaker    Gout    Cervical spine arthritis 04/05/2016   Long-term insulin use in type 2 diabetes ([place]) 04/05/2016    Past Surgical History:  Procedure Laterality Date   COLONOSCOPY  02/11/2018   no polyps, + small internal hemorrhoids   EUS N/A 12/12/2017   Procedure: UPPER ENDOSCOPIC ULTRASOUND (EUS) RADIAL;  Surgeon: [name], MD;  Location: [facility];  Service: Endoscopy;  Laterality: N/A;   GASTRECTOMY  JOINT REPLACEMENT     pace maker     PARTIAL GASTRECTOMY     TONSILLECTOMY     105 years ago   UPPER GASTROINTESTINAL ENDOSCOPY       OB History     Gravida      Para      Term      Preterm      AB      Living  3      SAB      IAB      Ectopic      Multiple      Live Births              Family History  Problem Relation Age of Onset   Diabetes Mother    Diabetes Father    Heart disease Father    Diabetes Sister    Heart disease Son    Colon cancer Neg Hx    Colon polyps Neg Hx    Rectal cancer Neg Hx    Stomach cancer Neg Hx    Esophageal cancer Neg Hx     Social History   Tobacco Use   Smoking status: Never   Smokeless tobacco: Never  Vaping Use   Vaping Use: Never used   Substance Use Topics   Alcohol use: No    Alcohol/week: 0.0 standard drinks   Drug use: No    Home Medications Prior to Admission medications   Medication Sig Start Date End Date Taking? Authorizing Provider  Biotin 1000 MCG tablet Take 1,000 mcg by mouth daily.     [provider]  blood glucose meter kit and supplies KIT Dispense based on patient and insurance preference. Check capillary glucose three times a day, before meals.  Dx E11.65, Z79.4 10/29/17   Jacelyn Pi, Lilia Argue, MD  carvedilol (COREG) 25 MG tablet Take 1 tablet (25 mg total) by mouth 2 (two) times daily with a meal. 01/22/19   Jacelyn Pi, Lilia Argue, MD  colchicine 0.6 MG tablet Take 0.6 mg by mouth daily.     [provider]  DULoxetine (CYMBALTA) 60 MG capsule TAKE 1 CAPSULE (60 MG TOTAL) DAILY BY MOUTH. 04/28/18   Jacelyn Pi, Lilia Argue, MD  famotidine (PEPCID) 20 MG tablet Take 1 tablet (20 mg total) by mouth at bedtime. 06/11/19   Jacelyn Pi, Lilia Argue, MD  gabapentin (NEURONTIN) 100 MG capsule Take 1 capsule (100 mg) every morning and afternoon, and take 4 capsules (469m) at bedtime 04/03/18   SJacelyn Pi ILilia Argue MD  glucose blood (ONE TOUCH ULTRA TEST) test strip USE TO CHECK GLUCOSE LEVELS 3 TIMES A DAY, DX E11.9, Z79.4 11/30/17   SJacelyn Pi ILilia Argue MD  [facility]OG [facility] UN[facility]/ML KwikPen CHECK FSBS BEFORE EComplex Care Hospital At RidgelakeMEAL AND INJECT PER SSI(0-80.0UNITS)(80-149,5U)(150-180,9U)(181-500,CALL MD) 09/16/19   SJacelyn Pi ILilia Argue MD  insulin glargine (LANTUS SOLOSTAR) 100 UNIT/ML Solostar Pen Inject 37 Units into the skin daily at 10 pm. 08/23/20   SJacelyn Pi ILilia Argue MD  insulin lispro (HUMALOG) 100 UNIT/ML injection Inject 1-10 Units into the skin 3 (three) times daily before meals. Sliding scale    [provider]  Insulin Pen Needle (B-D ULTRAFINE III SHORT PEN) 31G X 8 MM MISC To use as directed with insulin, four times a day. Dx E11.9, Z79.4 11/08/17   SJacelyn Pi ILilia Argue MD  lidocaine  (LIDODERM) 5 % Place 1 patch onto the skin daily. Remove & Discard patch within 12 hours  or as directed by MD 06/06/21   Gareth Morgan, MD  losartan (COZAAR) 100 MG tablet Take 100 mg by mouth daily.     [provider]  Multiple Vitamin (MULTIVITAMIN) tablet Take 1 tablet by mouth daily.     [provider]  pantoprazole (PROTONIX) 40 MG tablet TAKE 1 TABLET (40 MG TOTAL) DAILY BY MOUTH. 04/28/18   Jacelyn Pi, Lilia Argue, MD  rosuvastatin (CRESTOR) 10 MG tablet Take 1 tablet (10 mg total) by mouth daily. 11/15/20   Just, Laurita Quint, FNP    Allergies    Asa [aspirin], Tylenol [acetaminophen], and Sulfa antibiotics  Review of Systems   Review of Systems  Constitutional:  Negative for fever.  Respiratory:  Negative for shortness of breath.   Cardiovascular:  Negative for chest pain.  Gastrointestinal:  Positive for nausea and vomiting. Negative for abdominal pain, constipation and diarrhea.   Physical Exam Updated Vital Signs BP (!) 189/78 (BP Location: Left Arm)   Pulse 74   Temp (!) 97.2 F (36.2 C) (Oral)   Resp 18   Ht _0  (1.6 m)   Wt 59 kg   SpO2 99%   BMI 23.03 kg/m   Physical Exam Constitutional:      General: She is in acute distress.     Appearance: She is well-developed. She is ill-appearing.     Comments: Holding emesis bag  HENT:     Head: Normocephalic.  Cardiovascular:     Rate and Rhythm: Normal rate and regular rhythm.     Heart sounds: Normal heart sounds. No murmur heard. Pulmonary:     Effort: Pulmonary effort is normal. No respiratory distress.     Breath sounds: Normal breath sounds. No wheezing, rhonchi or rales.  Abdominal:     General: Bowel sounds are decreased.     Comments: Examination very limited given patient discomfort she did not want an abdominal exam performed. She pointed to LLQ when asked where her pain was.   Skin:    General: Skin is warm and dry.     Capillary Refill: Capillary refill takes less than 2 seconds.   Neurological:     Mental Status: She is alert.    ED Results / Procedures / Treatments   Labs (all labs ordered are listed, but only abnormal results are displayed) Labs Reviewed  CBG MONITORING, ED - Abnormal; Notable for the following components:      Result Value   Glucose-Capillary 274 (*)    All other components within normal limits  COMPREHENSIVE METABOLIC PANEL  CBC WITH DIFFERENTIAL/PLATELET  LIPASE, BLOOD  I-STAT CHEM 8, ED    EKG None  Radiology No results found.  Procedures Procedures   Medications Ordered in ED Medications  lactated ringers infusion ( Intravenous New Bag/Given 11/28/21 1425)  metoCLOPramide (REGLAN) injection 5 mg (5 mg Intravenous Given 11/28/21 1426)  diphenhydrAMINE (BENADRYL) injection 12.5 mg (12.5 mg Intravenous Given 11/28/21 1425)    ED Course  I have reviewed the triage vital signs and the nursing notes.  Pertinent labs & imaging results that were available during my care of the patient were reviewed by me and considered in my medical decision making (see chart for details).    MDM Rules/Calculators/A&P                          Judith [name] is a 80 y.o. female who presents with acute nausea and vomiting since today. She appears  uncomfortable on examination and is holding an emesis bag. Abdominal examination was limited due to her discomfort. Given her age and PMHx of multiple GI problems including GERD, gastric ulcers, duodenal ulcers, partial gastrectomy, gastroparesis will obtain initial workup with CBC, CMP, lipase and CT abd/pelv with contrast if renal function allows. Will give IVF hydration. Ordered Reglan and benadryl as anti-emetic cocktail given her history of gastroparesis.   2:54 PM CBC with mild leukocytosis at 15.3, polycythemia with hemoglobin of 16, CMP notable for hyperglycemia to 96, sodium of 133 which corrects to 136.  Creatinine is stable at 0.79.  Lipase is normal at 19.  Given normal renal function will order CT  abdomen pelvis with contrast.  3:15 PM patient awaiting CT. Signed out to Dr. Vanita Panda. Will need reassessment after anti-emetics and further workup is pending imaging finding.   Final Clinical Impression(s) / ED Diagnoses Final diagnoses:  None    Rx / DC Orders ED Discharge Orders     None        Sharion Settler, DO 11/28/21 [place], [place], DO 11/29/21 0710

## 2021-11-28 NOTE — ED Triage Notes (Signed)
Pt brought in by ems from [facility] for abd pain, n/v.  Pt actively vomiting upon arrival. Pt states abd pain/n/v started about 5am today. Pt denies any recent sick contacts.

## 2021-11-29 DIAGNOSIS — K8012 Calculus of gallbladder with acute and chronic cholecystitis without obstruction: Secondary | ICD-10-CM | POA: Diagnosis not present

## 2021-11-29 DIAGNOSIS — M109 Gout, unspecified: Secondary | ICD-10-CM | POA: Diagnosis present

## 2021-11-29 DIAGNOSIS — E1165 Type 2 diabetes mellitus with hyperglycemia: Secondary | ICD-10-CM | POA: Diagnosis not present

## 2021-11-29 DIAGNOSIS — Z7401 Bed confinement status: Secondary | ICD-10-CM | POA: Diagnosis not present

## 2021-11-29 DIAGNOSIS — M6259 Muscle wasting and atrophy, not elsewhere classified, multiple sites: Secondary | ICD-10-CM | POA: Diagnosis not present

## 2021-11-29 DIAGNOSIS — E1142 Type 2 diabetes mellitus with diabetic polyneuropathy: Secondary | ICD-10-CM | POA: Diagnosis not present

## 2021-11-29 DIAGNOSIS — D62 Acute posthemorrhagic anemia: Secondary | ICD-10-CM | POA: Diagnosis not present

## 2021-11-29 DIAGNOSIS — M1000 Idiopathic gout, unspecified site: Secondary | ICD-10-CM | POA: Diagnosis not present

## 2021-11-29 DIAGNOSIS — K319 Disease of stomach and duodenum, unspecified: Secondary | ICD-10-CM | POA: Diagnosis not present

## 2021-11-29 DIAGNOSIS — Z20822 Contact with and (suspected) exposure to covid-19: Secondary | ICD-10-CM | POA: Diagnosis not present

## 2021-11-29 DIAGNOSIS — K82A1 Gangrene of gallbladder in cholecystitis: Secondary | ICD-10-CM | POA: Diagnosis not present

## 2021-11-29 DIAGNOSIS — I700 Atherosclerosis of aorta: Secondary | ICD-10-CM | POA: Diagnosis not present

## 2021-11-29 DIAGNOSIS — K8050 Calculus of bile duct without cholangitis or cholecystitis without obstruction: Secondary | ICD-10-CM | POA: Diagnosis not present

## 2021-11-29 DIAGNOSIS — D72829 Elevated white blood cell count, unspecified: Secondary | ICD-10-CM | POA: Diagnosis not present

## 2021-11-29 DIAGNOSIS — Z794 Long term (current) use of insulin: Secondary | ICD-10-CM | POA: Diagnosis not present

## 2021-11-29 DIAGNOSIS — Z833 Family history of diabetes mellitus: Secondary | ICD-10-CM | POA: Diagnosis not present

## 2021-11-29 DIAGNOSIS — Z934 Other artificial openings of gastrointestinal tract status: Secondary | ICD-10-CM | POA: Diagnosis not present

## 2021-11-29 DIAGNOSIS — L859 Epidermal thickening, unspecified: Secondary | ICD-10-CM | POA: Diagnosis present

## 2021-11-29 DIAGNOSIS — K812 Acute cholecystitis with chronic cholecystitis: Secondary | ICD-10-CM | POA: Diagnosis not present

## 2021-11-29 DIAGNOSIS — Y92239 Unspecified place in hospital as the place of occurrence of the external cause: Secondary | ICD-10-CM | POA: Diagnosis not present

## 2021-11-29 DIAGNOSIS — R2681 Unsteadiness on feet: Secondary | ICD-10-CM | POA: Diagnosis not present

## 2021-11-29 DIAGNOSIS — I442 Atrioventricular block, complete: Secondary | ICD-10-CM | POA: Diagnosis not present

## 2021-11-29 DIAGNOSIS — K2990 Gastroduodenitis, unspecified, without bleeding: Secondary | ICD-10-CM | POA: Diagnosis not present

## 2021-11-29 DIAGNOSIS — K5710 Diverticulosis of small intestine without perforation or abscess without bleeding: Secondary | ICD-10-CM | POA: Diagnosis not present

## 2021-11-29 DIAGNOSIS — I10 Essential (primary) hypertension: Secondary | ICD-10-CM | POA: Diagnosis not present

## 2021-11-29 DIAGNOSIS — G629 Polyneuropathy, unspecified: Secondary | ICD-10-CM | POA: Diagnosis not present

## 2021-11-29 DIAGNOSIS — D649 Anemia, unspecified: Secondary | ICD-10-CM | POA: Diagnosis not present

## 2021-11-29 DIAGNOSIS — Z886 Allergy status to analgesic agent status: Secondary | ICD-10-CM | POA: Diagnosis not present

## 2021-11-29 DIAGNOSIS — Z881 Allergy status to other antibiotic agents status: Secondary | ICD-10-CM | POA: Diagnosis not present

## 2021-11-29 DIAGNOSIS — R1310 Dysphagia, unspecified: Secondary | ICD-10-CM | POA: Diagnosis not present

## 2021-11-29 DIAGNOSIS — R531 Weakness: Secondary | ICD-10-CM | POA: Diagnosis not present

## 2021-11-29 DIAGNOSIS — Z950 Presence of cardiac pacemaker: Secondary | ICD-10-CM | POA: Diagnosis not present

## 2021-11-29 DIAGNOSIS — K8080 Other cholelithiasis without obstruction: Secondary | ICD-10-CM | POA: Diagnosis not present

## 2021-11-29 DIAGNOSIS — E785 Hyperlipidemia, unspecified: Secondary | ICD-10-CM | POA: Diagnosis present

## 2021-11-29 DIAGNOSIS — K3189 Other diseases of stomach and duodenum: Secondary | ICD-10-CM | POA: Diagnosis not present

## 2021-11-29 DIAGNOSIS — K8020 Calculus of gallbladder without cholecystitis without obstruction: Secondary | ICD-10-CM | POA: Diagnosis not present

## 2021-11-29 DIAGNOSIS — F32A Depression, unspecified: Secondary | ICD-10-CM | POA: Diagnosis present

## 2021-11-29 DIAGNOSIS — Z48816 Encounter for surgical aftercare following surgery on the genitourinary system: Secondary | ICD-10-CM | POA: Diagnosis not present

## 2021-11-29 DIAGNOSIS — G4730 Sleep apnea, unspecified: Secondary | ICD-10-CM | POA: Diagnosis present

## 2021-11-29 DIAGNOSIS — K838 Other specified diseases of biliary tract: Secondary | ICD-10-CM | POA: Diagnosis not present

## 2021-11-29 DIAGNOSIS — M6281 Muscle weakness (generalized): Secondary | ICD-10-CM | POA: Diagnosis not present

## 2021-11-29 DIAGNOSIS — Z8249 Family history of ischemic heart disease and other diseases of the circulatory system: Secondary | ICD-10-CM | POA: Diagnosis not present

## 2021-11-29 DIAGNOSIS — R41841 Cognitive communication deficit: Secondary | ICD-10-CM | POA: Diagnosis not present

## 2021-11-29 DIAGNOSIS — Z01810 Encounter for preprocedural cardiovascular examination: Secondary | ICD-10-CM | POA: Diagnosis not present

## 2021-11-29 DIAGNOSIS — D751 Secondary polycythemia: Secondary | ICD-10-CM | POA: Diagnosis not present

## 2021-11-29 DIAGNOSIS — Z79899 Other long term (current) drug therapy: Secondary | ICD-10-CM | POA: Diagnosis not present

## 2021-11-29 DIAGNOSIS — K8066 Calculus of gallbladder and bile duct with acute and chronic cholecystitis without obstruction: Secondary | ICD-10-CM | POA: Diagnosis not present

## 2021-11-29 DIAGNOSIS — K2950 Unspecified chronic gastritis without bleeding: Secondary | ICD-10-CM | POA: Diagnosis not present

## 2021-11-29 LAB — COMPREHENSIVE METABOLIC PANEL
ALT: 43 U/L (ref 0–44)
AST: 138 U/L — ABNORMAL HIGH (ref 15–41)
Albumin: 3.7 g/dL (ref 3.5–5.0)
Alkaline Phosphatase: 96 U/L (ref 38–126)
Anion gap: 10 (ref 5–15)
BUN: 12 mg/dL (ref 8–23)
CO2: 25 mmol/L (ref 22–32)
Calcium: 8.9 mg/dL (ref 8.9–10.3)
Chloride: 98 mmol/L (ref 98–111)
Creatinine, Ser: 0.73 mg/dL (ref 0.44–1.00)
GFR, Estimated: >60 mL/min (ref >60)
Glucose, Bld: 279 mg/dL — ABNORMAL HIGH (ref 70–99)
Potassium: 3.4 mmol/L — ABNORMAL LOW (ref 3.5–5.1)
Sodium: 133 mmol/L — ABNORMAL LOW (ref 135–145)
Total Bilirubin: 1.3 mg/dL — ABNORMAL HIGH (ref 0.3–1.2)
Total Protein: 6.4 g/dL — ABNORMAL LOW (ref 6.5–8.1)

## 2021-11-29 LAB — GLUCOSE, CAPILLARY
Glucose-Capillary: 160 mg/dL — ABNORMAL HIGH (ref 70–99)
Glucose-Capillary: 222 mg/dL — ABNORMAL HIGH (ref 70–99)
Glucose-Capillary: 249 mg/dL — ABNORMAL HIGH (ref 70–99)
Glucose-Capillary: 272 mg/dL — ABNORMAL HIGH (ref 70–99)
Glucose-Capillary: 286 mg/dL — ABNORMAL HIGH (ref 70–99)
Glucose-Capillary: 305 mg/dL — ABNORMAL HIGH (ref 70–99)

## 2021-11-29 LAB — CBC
HCT: 39.2 % (ref 36.0–46.0)
Hemoglobin: 12.7 g/dL (ref 12.0–15.0)
MCH: 30.5 pg (ref 26.0–34.0)
MCHC: 32.4 g/dL (ref 30.0–36.0)
MCV: 94.2 fL (ref 80.0–100.0)
Platelets: 199 10*3/uL (ref 150–400)
RBC: 4.16 MIL/uL (ref 3.87–5.11)
RDW: 14 % (ref 11.5–15.5)
WBC: 16.9 10*3/uL — ABNORMAL HIGH (ref 4.0–10.5)
nRBC: 0 % (ref 0.0–0.2)

## 2021-11-29 LAB — HEMOGLOBIN A1C
Hgb A1c MFr Bld: 7.9 % — ABNORMAL HIGH (ref 4.8–5.6)
Mean Plasma Glucose: 180.03 mg/dL

## 2021-11-29 MED ORDER — INSULIN ASPART 100 UNIT/ML IJ SOLN
0.0000 [IU] | Freq: Every day | INTRAMUSCULAR | Status: DC
  Administered 2021-12-01: 3 [IU] via SUBCUTANEOUS
  Administered 2021-12-06: 22:00:00 2 [IU] via SUBCUTANEOUS

## 2021-11-29 MED ORDER — ROSUVASTATIN CALCIUM 10 MG PO TABS
10.0000 mg | ORAL_TABLET | Freq: Every day | ORAL | Status: DC
  Administered 2021-12-01 – 2021-12-02 (×2): 10 mg via ORAL
  Filled 2021-11-29 (×3): qty 1

## 2021-11-29 MED ORDER — INSULIN ASPART 100 UNIT/ML IJ SOLN
0.0000 [IU] | Freq: Three times a day (TID) | INTRAMUSCULAR | Status: DC
  Administered 2021-11-29: 13:00:00 7 [IU] via SUBCUTANEOUS
  Administered 2021-11-29: 3 [IU] via SUBCUTANEOUS
  Administered 2021-11-30 (×2): 2 [IU] via SUBCUTANEOUS
  Administered 2021-12-01: 5 [IU] via SUBCUTANEOUS
  Administered 2021-12-01 (×2): 3 [IU] via SUBCUTANEOUS
  Administered 2021-12-02 – 2021-12-03 (×6): 2 [IU] via SUBCUTANEOUS
  Administered 2021-12-04 – 2021-12-05 (×3): 1 [IU] via SUBCUTANEOUS
  Administered 2021-12-06 – 2021-12-07 (×3): 2 [IU] via SUBCUTANEOUS
  Administered 2021-12-07: 3 [IU] via SUBCUTANEOUS
  Administered 2021-12-08 – 2021-12-09 (×3): 2 [IU] via SUBCUTANEOUS
  Administered 2021-12-09: 1 [IU] via SUBCUTANEOUS
  Administered 2021-12-10: 17:00:00 5 [IU] via SUBCUTANEOUS
  Administered 2021-12-11: 12:00:00 2 [IU] via SUBCUTANEOUS

## 2021-11-29 MED ORDER — OXYCODONE HCL 5 MG PO TABS
5.0000 mg | ORAL_TABLET | Freq: Four times a day (QID) | ORAL | Status: DC | PRN
  Administered 2021-12-01 – 2021-12-10 (×19): 5 mg via ORAL
  Filled 2021-11-29 (×21): qty 1

## 2021-11-29 MED ORDER — INSULIN GLARGINE 100 UNIT/ML SOLOSTAR PEN: 15.0000 [IU] | PEN_INJECTOR | Freq: Every day | SUBCUTANEOUS | Status: DC

## 2021-11-29 MED ORDER — DULOXETINE HCL 60 MG PO CPEP
60.0000 mg | ORAL_CAPSULE | Freq: Every day | ORAL | Status: DC
  Administered 2021-11-29 – 2021-12-11 (×11): 60 mg via ORAL
  Filled 2021-11-29 (×11): qty 1

## 2021-11-29 MED ORDER — LOSARTAN POTASSIUM 50 MG PO TABS
100.0000 mg | ORAL_TABLET | Freq: Every day | ORAL | Status: DC
  Administered 2021-11-29 – 2021-12-11 (×11): 100 mg via ORAL
  Filled 2021-11-29 (×11): qty 2

## 2021-11-29 MED ORDER — PANTOPRAZOLE SODIUM 40 MG IV SOLR
40.0000 mg | Freq: Two times a day (BID) | INTRAVENOUS | Status: DC
  Administered 2021-11-29 – 2021-12-08 (×17): 40 mg via INTRAVENOUS
  Filled 2021-11-29 (×18): qty 40

## 2021-11-29 MED ORDER — COLCHICINE 0.6 MG PO TABS
0.6000 mg | ORAL_TABLET | Freq: Every day | ORAL | Status: DC
  Administered 2021-12-01 – 2021-12-11 (×10): 0.6 mg via ORAL
  Filled 2021-11-29 (×13): qty 1

## 2021-11-29 MED ORDER — INSULIN GLARGINE-YFGN 100 UNIT/ML ~~LOC~~ SOLN
15.0000 [IU] | Freq: Every day | SUBCUTANEOUS | Status: DC
  Administered 2021-11-29 – 2021-12-11 (×12): 15 [IU] via SUBCUTANEOUS
  Filled 2021-11-29 (×13): qty 0.15

## 2021-11-29 MED ORDER — POTASSIUM CHLORIDE CRYS ER 20 MEQ PO TBCR
40.0000 meq | EXTENDED_RELEASE_TABLET | Freq: Once | ORAL | Status: DC
  Filled 2021-11-29 (×2): qty 2

## 2021-11-29 MED ORDER — INSULIN ASPART 100 UNIT/ML IJ SOLN: 0.0000 [IU] | Freq: Three times a day (TID) | INTRAMUSCULAR | Status: DC

## 2021-11-29 MED ORDER — SODIUM CHLORIDE 0.9 % IV SOLN
6.2500 mg | Freq: Once | INTRAVENOUS | Status: AC
  Administered 2021-11-30: 6.25 mg via INTRAVENOUS
  Filled 2021-11-29: qty 0.25

## 2021-11-29 MED ORDER — ADULT MULTIVITAMIN W/MINERALS CH
1.0000 | ORAL_TABLET | Freq: Every day | ORAL | Status: DC
  Administered 2021-12-01 – 2021-12-11 (×10): 1 via ORAL
  Filled 2021-11-29 (×10): qty 1

## 2021-11-29 MED ORDER — GABAPENTIN 100 MG PO CAPS
100.0000 mg | ORAL_CAPSULE | Freq: Two times a day (BID) | ORAL | Status: DC
  Administered 2021-11-29 – 2021-12-11 (×20): 100 mg via ORAL
  Filled 2021-11-29 (×22): qty 1

## 2021-11-29 MED ORDER — GABAPENTIN 400 MG PO CAPS
400.0000 mg | ORAL_CAPSULE | Freq: Every day | ORAL | Status: DC
  Administered 2021-11-29 – 2021-12-10 (×12): 400 mg via ORAL
  Filled 2021-11-29 (×12): qty 1

## 2021-11-29 NOTE — TOC Initial Note (Signed)
Transition of Care [facility]) - Initial/Assessment Note    Patient Details  Name: Judith [name] MRN: [id number] Date of Birth: [birth year]  Transition of Care [facility]) CM/SW Contact:    Leeroy Cha, RN Phone Number: 11/29/2021, 7:40 AM  Clinical Narrative:                 Ct scan of abd confirms cholelithasis, gi consult for poss. Ercp pending. Iv lr at 100cc/hr and npo. Plan is to return home with self care.  Lives alone does have family in the area.  Expected Discharge Plan: Home/Self Care Barriers to Discharge: Continued Medical Work up   Patient Goals and CMS Choice Patient states their goals for this hospitalization and ongoing recovery are:: to return to my home CMS Medicare.gov Compare Post Acute Care list provided to:: Patient Choice offered to / list presented to : Patient  Expected Discharge Plan and Services Expected Discharge Plan: Home/Self Care   Discharge Planning Services: CM Consult                                          Prior Living Arrangements/Services   Lives with:: Self Patient language and need for interpreter reviewed:: Yes Do you feel safe going back to the place where you live?: Yes            Criminal Activity/Legal Involvement Pertinent to Current Situation/Hospitalization: No - Comment as needed  Activities of Daily Living Home Assistive Devices/Equipment: Eyeglasses (reading glasses) ADL Screening (condition at time of admission) Patient's cognitive ability adequate to safely complete daily activities?: Yes Is the patient deaf or have difficulty hearing?: No Does the patient have difficulty seeing, even when wearing glasses/contacts?: No Does the patient have difficulty concentrating, remembering, or making decisions?: No Patient able to express need for assistance with ADLs?: Yes Does the patient have difficulty dressing or bathing?: No Independently performs ADLs?: Yes (appropriate for developmental age) Does the patient have  difficulty walking or climbing stairs?: Yes Weakness of Legs: Both Weakness of Arms/Hands: Both  Permission Sought/Granted                  Emotional Assessment Appearance:: Appears stated age Attitude/Demeanor/Rapport: Engaged Affect (typically observed): Calm Orientation: : Oriented to Self, Oriented to Place, Oriented to  Time, Oriented to Situation Alcohol / Substance Use: Not Applicable Psych Involvement: No (comment)  Admission diagnosis:  Choledocholithiasis [K80.50] Patient Active Problem List   Diagnosis Date Noted   Choledocholithiasis 11/28/2021   Sleep apnea    Hyperlipidemia    Type 2 diabetes mellitus with hyperglycemia ([place])    Polycythemia    Unspecified inflammatory spondylopathy, cervical region ([place]) 11/15/2020   Multilevel degenerative disc disease 04/03/2018   Acute blood loss anemia 01/17/2018   Gallstones 01/17/2018   Weight loss, unintentional 01/17/2018   Dilated bile duct    Elevated liver function tests    Gastritis and gastroduodenitis    S/P partial gastrectomy    Peripheral neuropathy    Hypertension    Gastroparesis    Duodenal ulcer disease    Controlled type 2 diabetes mellitus with diabetic neuropathy, with long-term current use of insulin (HCC)    Depression    Biliary gastritis    Seasonal allergies    Presence of cardiac pacemaker    Gout    Cervical spine arthritis 04/05/2016   Long-term insulin use in type 2 diabetes (  [place]) 04/05/2016   PCP:  Janie Morning, DO Pharmacy:   [place], [address] [place] [address] Phone: [phone] Fax: [phone]     Social Determinants of Health (SDOH) Interventions    Readmission Risk Interventions No flowsheet data found.

## 2021-11-29 NOTE — Progress Notes (Signed)
Telephone Call  Spoke with daughter and explained that we are planning on ERCP tomorrow afternoon 2:30 pm with Dr. Rush Landmark. Explained that this is a technically difficult procedure and we will attempt and then discuss further plans after the attempt. I answered all her questions.  Ellouise Newer, PA-C

## 2021-11-29 NOTE — H&P (View-Only) (Signed)
Telephone Call  Spoke with daughter and explained that we are planning on ERCP tomorrow afternoon 2:30 pm with Dr. Rush Landmark. Explained that this is a technically difficult procedure and we will attempt and then discuss further plans after the attempt. I answered all her questions.  Ellouise Newer, PA-C

## 2021-11-29 NOTE — Progress Notes (Signed)
PROGRESS NOTE   Judith [name]  BSJ:[id number]    DOB: Jul 26, 1941    DOA: 11/28/2021  PCP: Janie Morning, DO   I have briefly reviewed patients previous medical records in [facility].  Chief Complaint  Patient presents with   Abdominal Pain   Emesis    Brief Narrative:   80 y.o. female with medical history significant of normocytic anemia, osteoarthritis, biliary gastritis, cervical spine DDD, controlled type II DM with neuropathy, depression, gout, hyperlipidemia, hypertension, osteoporosis, seasonal allergies, sleep apnea not on CPAP, history of UTI, history of partial gastrectomy, biliary gastritis, GERD, gastroparesis, duodenal and gastric ulcers, history of gallstones, dilated bile duct who is coming to the emergency department due to multiple episodes of emesis, nausea and abdominal pain on day of admission.  CT abdomen and pelvis with contrast showed cholelithiasis and choledocholithiasis up to 1.1 cm.  Admitted for suspected symptomatic choledocholithiasis/cholelithiasis, [facility] consulted and plan for ERCP on 12/8 at 2:30 PM (technically difficult procedure)   Assessment & Plan:  Principal Problem:   Choledocholithiasis Active Problems:   Peripheral neuropathy   Hypertension   Gout   Sleep apnea   Hyperlipidemia   Type 2 diabetes mellitus with hyperglycemia (HCC)   Polycythemia   Principal Problem: Abdominal pain, nausea and vomiting/suspecting symptomatic choledocholithiasis: - CT abdomen shows choledocholithiasis up to 1.1 cm, lab work shows minimally elevated bilirubin and alkaline phosphatase, other LFTs unremarkable. - Continue supportive care with as needed analgesics, antiemetics, IV PPI. [facility] input appreciated.  Prior history of [place] II anatomy, unsuccessful EUS in the past, plan for ERCP on 12/8 at 2:30 PM and indicate that this may be a technically difficult procedure due to altered anatomy. - Trend daily LFTs. -Defer antibiotic decision to  GI.  Continue clear liquid diet for today and n.p.o. after midnight.  Active Problems:   Peripheral neuropathy Continue gabapentin 100 mg p.o. twice daily Continue gabapentin 400 mg p.o. bedtime.     Hypertension Continue carvedilol 25 mg p.o. twice daily and home dose of losartan. Labetalol 20 mg every 2 hours as needed. Monitor blood pressure and heart rate. Controlled.     Gout On colchicine, continue. Currently asymptomatic.     Sleep apnea Not on CPAP.     Hyperlipidemia/Aortic atherosclerosis Continue rosuvastatin.     Type 2 diabetes mellitus with hyperglycemia (HCC) Uncontrolled.  CBG up to 304 this morning.  Missed home dose of Lantus last night. Started Semglee at reduced dose 15 units daily, NovoLog SSI.  Monitor and adjust insulins as needed.     Polycythemia Resolved after IV hydration.  Hypokalemia Replace and follow.  Leukocytosis WBC up from 15.3-16.9.  No fevers though.  May be stress response.  Defer antibiotic decision to GI.  Body mass index is 23.03 kg/m.  Nutritional Status          DVT prophylaxis: SCDs Start: 11/28/21 1643     Code Status: Full Code Family Communication: None at bedside. Disposition:  Status is: Inpatient  Remains inpatient appropriate because: Severity of presentation with several episodes of nausea and vomiting, abdominal pain and concern for symptomatic choledocholithiasis, plan for ERCP tomorrow in a patient with distorted biliary anatomy.        Consultants:   [facility]  Procedures:   None  Antimicrobials:      Subjective:  Seen this morning.  Reports that she had several up to 8-10 episodes of nonbloody emesis yesterday, persisting nausea and mid abdominal pain and tenderness-moderate  in intensity.  States that she had 3 BMs yesterday and thought that would relieve her nausea and vomiting but it did not.  No fever or chills.  Objective:   Vitals:   11/28/21 2221 11/29/21 0009 11/29/21 0545  11/29/21 1140  BP: (!) 167/83 138/73 (!) 144/59 (!) 133/42  Pulse: 89 88 83 68  Resp:    18  Temp: 98.5 F (36.9 C) 98.8 F (37.1 C) 98.5 F (36.9 C) 98 F (36.7 C)  TempSrc: Oral Oral Oral Oral  SpO2: 95% 96% 98% 94%  Weight:      Height:        General exam: Elderly female, moderately built and nourished lying uncomfortably propped up in bed without distress.  Oral mucosa with borderline hydration. Respiratory system: Clear to auscultation. Respiratory effort normal. Cardiovascular system: S1 & S2 heard, RRR. No JVD, murmurs, rubs, gallops or clicks. No pedal edema.  Telemetry personally reviewed: V paced rhythm. Gastrointestinal system: Abdomen is nondistended, soft, mid abdominal tenderness with voluntary guarding but no rigidity or rebound.  No organomegaly or masses appreciated.  Normal bowel sounds heard. Central nervous system: Alert and oriented. No focal neurological deficits. Extremities: Symmetric 5 x 5 power. Skin: No rashes, lesions or ulcers Psychiatry: Judgement and insight appear normal. Mood & affect appropriate.     Data Reviewed:   I have personally reviewed following labs and imaging studies   CBC: Recent Labs  Lab 11/28/21 1417 11/28/21 1439 11/29/21 0458  WBC 15.3*  --  16.9*  NEUTROABS 13.6*  --   --   HGB 16.0* 17.0* 12.7  HCT 48.3* 50.0* 39.2  MCV 92.4  --  94.2  PLT 283  --  643    Basic Metabolic Panel: Recent Labs  Lab 11/28/21 1417 11/28/21 1439 11/29/21 0458  NA 133* 135 133*  K 4.9 4.9 3.4*  CL 94* 97* 98  CO2 24  --  25  GLUCOSE 296* 296* 279*  BUN 14 16 12   CREATININE 0.79 0.70 0.73  CALCIUM 9.7  --  8.9    Liver Function Tests: Recent Labs  Lab 11/28/21 1417 11/29/21 0458  AST 33 138*  ALT 14 43  ALKPHOS 66 96  BILITOT 1.5* 1.3*  PROT 8.3* 6.4*  ALBUMIN 4.7 3.7    CBG: Recent Labs  Lab 11/29/21 0002 11/29/21 0540 11/29/21 1136  GLUCAP 249* 272* 305*    Microbiology Studies:  No results found for this  or any previous visit (from the past 240 hour(s)).  Radiology Studies:  CT ABDOMEN PELVIS W CONTRAST  Result Date: 11/28/2021 CLINICAL DATA:  Nonlocalized acute abdominal pain. EXAM: CT ABDOMEN AND PELVIS WITH CONTRAST TECHNIQUE: Multidetector CT imaging of the abdomen and pelvis was performed using the standard protocol following bolus administration of intravenous contrast. CONTRAST:  47mL OMNIPAQUE IOHEXOL 350 MG/ML SOLN COMPARISON:  None. FINDINGS: Lower chest: Partially visualized cardiac leads. Query mitral annular calcifications. No acute abnormality. Tiny hiatal hernia. Hepatobiliary: No focal liver abnormality. Calcified gallstone noted within the gallbladder lumen. No gallbladder wall thickening or pericholecystic fluid. The main pancreatic duct is enlarged in caliber measuring up to 1.3 cm with several gallstone noted within its lumen (largest measuring up to 1.1 cm). Pancreas: No focal lesion. Normal pancreatic contour. No surrounding inflammatory changes. No main pancreatic ductal dilatation. Spleen: Normal in size without focal abnormality. Adrenals/Urinary Tract: No adrenal nodule bilaterally. Bilateral kidneys enhance symmetrically. Bilateral renal cortical scarring. Fluid density lesion within left kidney likely represents a simple  renal cyst and measures up to 1.8 cm. Subcentimeter hypodensities are too small to characterize. No hydronephrosis. No hydroureter. The urinary bladder is unremarkable. On delayed imaging, there is no urothelial wall thickening and there are no filling defects in the opacified portions of the bilateral collecting systems or ureters. Stomach/Bowel: Partial gastrectomy surgical changes noted. Stomach is within normal limits. No evidence of bowel wall thickening or dilatation. Appendix appears normal. Vascular/Lymphatic: No abdominal aorta or iliac aneurysm. Severe atherosclerotic plaque of the aorta and its branches. No abdominal, pelvic, or inguinal lymphadenopathy.  Reproductive: Uterus and bilateral adnexa are unremarkable. Other: No intraperitoneal free fluid. No intraperitoneal free gas. No organized fluid collection. Musculoskeletal: No abdominal wall hernia or abnormality. No suspicious lytic or blastic osseous lesions. No acute displaced fracture. Levoscoliosis of the thoracolumbar spine. Multilevel degenerative changes of the spine. IMPRESSION: 1. Cholelithiasis and choledocholithiasis (up to 1.1cm). 2.  Aortic Atherosclerosis (ICD10-I70.0) - severe. Electronically Signed   By: Iven Finn M.D.   On: 11/28/2021 15:51    Scheduled Meds:    carvedilol  25 mg Oral BID WC   colchicine  0.6 mg Oral Daily   DULoxetine  60 mg Oral Daily   gabapentin  100 mg Oral BID   gabapentin  400 mg Oral QHS   insulin aspart  0-5 Units Subcutaneous QHS   insulin aspart  0-9 Units Subcutaneous TID WC   insulin glargine-yfgn  15 Units Subcutaneous Daily   losartan  100 mg Oral Daily   melatonin  3 mg Oral QHS   [START ON 11/30/2021] multivitamin with minerals  1 tablet Oral Daily   pantoprazole (PROTONIX) IV  40 mg Intravenous Q12H   potassium chloride  40 mEq Oral Once   rosuvastatin  10 mg Oral Daily    Continuous Infusions:    lactated ringers 100 mL/hr at 11/29/21 0430     LOS: 0 days     Vernell Leep, MD,  FACP, [facility], [facility], [facility] (Care Management Physician Certified) [place]  To contact the attending provider between 7A-7P or the covering provider during after hours 7P-7A, please log into the web site [email or web address] and access using universal [place] password for that web site. If you do not have the password, please call the hospital operator.  11/29/2021, 3:01 PM

## 2021-11-29 NOTE — H&P (View-Only) (Signed)
Consultation  Referring Provider: Dr. Algis Liming  Primary Care Physician:  Janie Morning, DO Primary Gastroenterologist: Dr. Hilarie Fredrickson       Reason for Consultation: Choledocholithiasis            HPI:   Judith [name] is a 80 y.o. female with past medical history significant for normocytic anemia, osteoarthritis, biliary gastritis, cervical spine DDD, type 2 diabetes with neuropathy, depression, osteoporosis, history of partial gastrectomy (BII anatomy), GERD, gastroparesis, gastric and duodenal ulcers, gallstones and multiple others, who presented to the ED on 11/28/2021 with abdominal pain and nausea which had started earlier.    Today, the patient tells me she was feeling fine until around 5 AM yesterday, 11/28/2021 when she awoke with nausea and started vomiting.  Explains that she went then went to the bathroom and had 3 "very large solid" bowel movements.  Tells me she had eaten spaghetti the night before, but all she vomited was stomach acid.  She continued to vomit throughout the day and presented to the ER.  Along with this notes epigastric pain.  Describes her history of previous ulcer disease and feels like it is the same.  Tells me now she remains nauseous regardless of Zofran but has not vomited since yesterday.  Also describes her history of gastroparesis but apparently has never been on medication for this at home.    Denies fever, chills, blood in her stool or weight loss.  ED course: BP 189/78, CBC with a white count of 15.3, hemoglobin 16, platelets 283, lipase normal, CT of the abdomen pelvis with contrast showed cholelithiasis and choledocholithiasis up to 1.1 cm  GI history: 02/11/2018 colonoscopy: Small internal hemorrhoids and otherwise normal 12/12/2017 EUS: With large amount of retained solid food in the stomach, apparent Billroth II anatomy with adjacent gastric and jejunal inflammation but no frank cancer, unable to visualize the head of the pancreas, extrahepatic bile ducts  due to her postsurgical anatomy, LFTs thought possibly increased due to extreme Tylenol use and she was told to discontinue 11/11/2017 office visit with Dr. Hilarie Fredrickson: That time discussed gastroparesis which improved with small more frequent meals and a modified diet, also had some elevated LFTs and discussed GERD using Pantoprazole 40 daily, Ranitidine 150 mg in the evening and Carafate 4 times a day  Past Medical History:  Diagnosis Date   Anemia    Arthritis    Biliary gastritis    Blood transfusion without reported diagnosis    2007   Cervical spine arthritis 04/05/2016   Controlled type 2 diabetes mellitus with diabetic neuropathy, with long-term current use of insulin ([place])    Depression    Diabetes mellitus type 2 with complications ([place])    Dilated bile duct    Duodenal ulcer disease    Encounter for long-term (current) use of insulin ([place])    Gallstones 01/17/2018   Gastric ulcer    Gastritis and gastroduodenitis    Gastroparesis    severe   GERD (gastroesophageal reflux disease)    Gout    tested for gout but was not   Heart murmur    Hyperlipidemia    in past not now   Hypertension    Hypokalemia, gastrointestinal losses    secondary to severe gastroparesis   Long-term insulin use in type 2 diabetes ([place]) 04/05/2016   Osteoporosis    just had bone density test possible    Peripheral neuropathy    Presence of cardiac pacemaker    S/P partial gastrectomy  due to severe gastric and gudoenal ulcers   Seasonal allergies    Sleep apnea    could not use CPAP   UTI (urinary tract infection)     Past Surgical History:  Procedure Laterality Date   COLONOSCOPY  02/11/2018   no polyps, + small internal hemorrhoids   EUS N/A 12/12/2017   Procedure: UPPER ENDOSCOPIC ULTRASOUND (EUS) RADIAL;  Surgeon: [name], MD;  Location: [facility];  Service: Endoscopy;  Laterality: N/A;   GASTRECTOMY     JOINT REPLACEMENT     pace maker     PARTIAL GASTRECTOMY      TONSILLECTOMY     90 years ago   UPPER GASTROINTESTINAL ENDOSCOPY      Family History  Problem Relation Age of Onset   Diabetes Mother    Diabetes Father    Heart disease Father    Diabetes Sister    Heart disease Son    Colon cancer Neg Hx    Colon polyps Neg Hx    Rectal cancer Neg Hx    Stomach cancer Neg Hx    Esophageal cancer Neg Hx     Social History   Tobacco Use   Smoking status: Never   Smokeless tobacco: Never  Vaping Use   Vaping Use: Never used  Substance Use Topics   Alcohol use: No    Alcohol/week: 0.0 standard drinks   Drug use: No    Prior to Admission medications   Medication Sig Start Date End Date Taking? Authorizing Provider  Biotin 1000 MCG tablet Take 1,000 mcg by mouth daily.     [provider]  blood glucose meter kit and supplies KIT Dispense based on patient and insurance preference. Check capillary glucose three times a day, before meals.  Dx E11.65, Z79.4 10/29/17   Jacelyn Pi, Lilia Argue, MD  carvedilol (COREG) 25 MG tablet Take 1 tablet (25 mg total) by mouth 2 (two) times daily with a meal. 01/22/19   Jacelyn Pi, Lilia Argue, MD  colchicine 0.6 MG tablet Take 0.6 mg by mouth daily.     [provider]  DULoxetine (CYMBALTA) 60 MG capsule TAKE 1 CAPSULE (60 MG TOTAL) DAILY BY MOUTH. 04/28/18   Jacelyn Pi, Lilia Argue, MD  famotidine (PEPCID) 20 MG tablet Take 1 tablet (20 mg total) by mouth at bedtime. 06/11/19   Jacelyn Pi, Lilia Argue, MD  gabapentin (NEURONTIN) 100 MG capsule Take 1 capsule (100 mg) every morning and afternoon, and take 4 capsules (480m) at bedtime 04/03/18   SJacelyn Pi ILilia Argue MD  glucose blood (ONE TOUCH ULTRA TEST) test strip USE TO CHECK GLUCOSE LEVELS 3 TIMES A DAY, DX E11.9, Z79.4 11/30/17   SJacelyn Pi ILilia Argue MD  [facility] UN[facility]/ML KwikPen CHECK FSBS BEFORE [facility] Center[facility] AND INJECT PER SSI(0-80.0UNITS)(80-149,5U)(150-180,9U)(181-500,CALL MD) 09/16/19   SJacelyn Pi ILilia Argue MD  insulin glargine  (LANTUS SOLOSTAR) 100 UNIT/ML Solostar Pen Inject 37 Units into the skin daily at 10 pm. 08/23/20   SJacelyn Pi ILilia Argue MD  insulin lispro (HUMALOG) 100 UNIT/ML injection Inject 1-10 Units into the skin 3 (three) times daily before meals. Sliding scale    [provider]  Insulin Pen Needle (B-D ULTRAFINE III SHORT PEN) 31G X 8 MM MISC To use as directed with insulin, four times a day. Dx E11.9, Z79.4 11/08/17   SJacelyn Pi ILilia Argue MD  lidocaine (LIDODERM) 5 % Place 1 patch onto the skin daily. Remove & Discard patch  within 12 hours or as directed by MD 06/06/21   Gareth Morgan, MD  losartan (COZAAR) 100 MG tablet Take 100 mg by mouth daily.     [provider]  Multiple Vitamin (MULTIVITAMIN) tablet Take 1 tablet by mouth daily.     [provider]  pantoprazole (PROTONIX) 40 MG tablet TAKE 1 TABLET (40 MG TOTAL) DAILY BY MOUTH. 04/28/18   Jacelyn Pi, Lilia Argue, MD  rosuvastatin (CRESTOR) 10 MG tablet Take 1 tablet (10 mg total) by mouth daily. 11/15/20   Just, Laurita Quint, FNP    Current Facility-Administered Medications  Medication Dose Route Frequency Provider Last Rate Last Admin   carvedilol (COREG) tablet 25 mg  25 mg Oral BID WC Reubin Milan, MD   25 mg at 11/29/21 0813   HYDROmorphone (DILAUDID) injection 0.5 mg  0.5 mg Intravenous Q2H PRN Reubin Milan, MD   0.5 mg at 11/29/21 1308   labetalol (NORMODYNE) injection 20 mg  20 mg Intravenous Q2H PRN Reubin Milan, MD       lactated ringers infusion   Intravenous Continuous Reubin Milan, MD 100 mL/hr at 11/29/21 0430 New Bag at 11/29/21 0430   melatonin tablet 3 mg  3 mg Oral QHS Kathryne Eriksson, NP   3 mg at 11/28/21 2113   ondansetron (ZOFRAN) tablet 4 mg  4 mg Oral Q6H PRN Reubin Milan, MD       Or   ondansetron [facility]) injection 4 mg  4 mg Intravenous Q6H PRN Reubin Milan, MD   4 mg at 11/29/21 0429   pantoprazole (PROTONIX) injection 40 mg  40 mg Intravenous Q24H  Reubin Milan, MD   40 mg at 11/28/21 1929    Allergies as of 11/28/2021 - Review Complete 11/28/2021  Allergen Reaction Noted   Asa [aspirin] Other (See Comments) 12/20/2015   Tylenol [acetaminophen]  04/22/2018   Sulfa antibiotics Rash 12/20/2015     Review of Systems:    Constitutional: No weight loss, fever or chills Skin: No rash  Cardiovascular: No chest pain Respiratory: No SOB  Gastrointestinal: See HPI and otherwise negative Genitourinary: No dysuria Neurological: No headache, dizziness or syncope Musculoskeletal: No new muscle or joint pain Hematologic: No bleeding  Psychiatric: No history of depression or anxiety    Physical Exam:  Vital signs in last 24 hours: Temp:  [97.2 F (36.2 C)-99.3 F (37.4 C)] 98.5 F (36.9 C) (12/07 0545) Pulse Rate:  [64-98] 83 (12/07 0545) Resp:  [13-20] 18 (12/06 1847) BP: (105-194)/(59-125) 144/59 (12/07 0545) SpO2:  [94 %-100 %] 98 % (12/07 0545) Weight:  [59 kg] 59 kg (12/06 1348) Last BM Date: 10/28/21 General:   Pleasant Elderly Caucasian female appears to be in NAD, Well developed, Well nourished, alert and cooperative Head:  Normocephalic and atraumatic. Eyes:   PEERL, EOMI. No icterus. Conjunctiva pink. Ears:  Normal auditory acuity. Neck:  Supple Throat: Oral cavity and pharynx without inflammation, swelling or lesion.  Lungs: Respirations even and unlabored. Lungs clear to auscultation bilaterally.   No wheezes, crackles, or rhonchi.  Heart: Normal S1, S2. No MRG. Regular rate and rhythm. No peripheral edema, cyanosis or pallor.  Abdomen:  Soft, nondistended, moderate epigastric TTP with involuntary guarding, normal bowel sounds. No appreciable masses or hepatomegaly. Rectal:  Not performed.  Msk:  Symmetrical without gross deformities. Peripheral pulses intact.  Extremities:  Without edema, no deformity or joint abnormality.  Neurologic:  Alert and  oriented x4;  grossly  normal neurologically. Skin:   Dry and  intact without significant lesions or rashes. Psychiatric: Demonstrates good judgement and reason without abnormal affect or behaviors.  LAB RESULTS: Recent Labs    11/28/21 1417 11/28/21 1439 11/29/21 0458  WBC 15.3*  --  16.9*  HGB 16.0* 17.0* 12.7  HCT 48.3* 50.0* 39.2  PLT 283  --  199   BMET Recent Labs    11/28/21 1417 11/28/21 1439 11/29/21 0458  NA 133* 135 133*  K 4.9 4.9 3.4*  CL 94* 97* 98  CO2 24  --  25  GLUCOSE 296* 296* 279*  BUN _0 CREATININE 0.79 0.70 0.73  CALCIUM 9.7  --  8.9   Hepatic Function Latest Ref Rng & Units 11/29/2021 11/28/2021 02/14/2021  Total Protein 6.5 - 8.1 g/dL 6.4(L) 8.3(H) 6.7  Albumin 3.5 - 5.0 g/dL 3.7 4.7 4.2  AST 15 - 41 U/L 138(H) 33 21  ALT 0 - 44 U/L 43 14 10  Alk Phosphatase 38 - 126 U/L 96 66 78  Total Bilirubin 0.3 - 1.2 mg/dL 1.3(H) 1.5(H) 0.5  Bilirubin, Direct 0.0 - 0.3 mg/dL - - -    STUDIES: CT ABDOMEN PELVIS W CONTRAST  Result Date: 11/28/2021 CLINICAL DATA:  Nonlocalized acute abdominal pain. EXAM: CT ABDOMEN AND PELVIS WITH CONTRAST TECHNIQUE: Multidetector CT imaging of the abdomen and pelvis was performed using the standard protocol following bolus administration of intravenous contrast. CONTRAST:  47m OMNIPAQUE IOHEXOL 350 MG/ML SOLN COMPARISON:  None. FINDINGS: Lower chest: Partially visualized cardiac leads. Query mitral annular calcifications. No acute abnormality. Tiny hiatal hernia. Hepatobiliary: No focal liver abnormality. Calcified gallstone noted within the gallbladder lumen. No gallbladder wall thickening or pericholecystic fluid. The main pancreatic duct is enlarged in caliber measuring up to 1.3 cm with several gallstone noted within its lumen (largest measuring up to 1.1 cm). Pancreas: No focal lesion. Normal pancreatic contour. No surrounding inflammatory changes. No main pancreatic ductal dilatation. Spleen: Normal in size without focal abnormality. Adrenals/Urinary Tract: No adrenal nodule  bilaterally. Bilateral kidneys enhance symmetrically. Bilateral renal cortical scarring. Fluid density lesion within left kidney likely represents a simple renal cyst and measures up to 1.8 cm. Subcentimeter hypodensities are too small to characterize. No hydronephrosis. No hydroureter. The urinary bladder is unremarkable. On delayed imaging, there is no urothelial wall thickening and there are no filling defects in the opacified portions of the bilateral collecting systems or ureters. Stomach/Bowel: Partial gastrectomy surgical changes noted. Stomach is within normal limits. No evidence of bowel wall thickening or dilatation. Appendix appears normal. Vascular/Lymphatic: No abdominal aorta or iliac aneurysm. Severe atherosclerotic plaque of the aorta and its branches. No abdominal, pelvic, or inguinal lymphadenopathy. Reproductive: Uterus and bilateral adnexa are unremarkable. Other: No intraperitoneal free fluid. No intraperitoneal free gas. No organized fluid collection. Musculoskeletal: No abdominal wall hernia or abnormality. No suspicious lytic or blastic osseous lesions. No acute displaced fracture. Levoscoliosis of the thoracolumbar spine. Multilevel degenerative changes of the spine. IMPRESSION: 1. Cholelithiasis and choledocholithiasis (up to 1.1cm). 2.  Aortic Atherosclerosis (ICD10-I70.0) - severe. Electronically Signed   By: MIven FinnM.D.   On: 11/28/2021 15:51      Impression / Plan:   Impression: 1.  Choledocholithiasis/cholelithiasis: Patient with abdominal pain, nausea and vomiting and a CT showing Choleydocholithiasis up to 1.1 cm, bili 1.5--> 1.3 overnight, alk phos 33--> 138, other LFTs normal, patient with known Billroth II anatomy, unsuccessful EUS in the past; we will need to consider ERCP versus  other interventions as this would likely be technically difficult, could also be element of gastroparesis+/- gastritis/PUD 2.  Type 2 diabetes 3.  Peripheral neuropathy 4.   Hypertension  Plan: 1.  Patient does have altered anatomy and due to this ERCP will be more complicated.  I will communicate with Dr. Rush Landmark our biliary specialist to see if this is something he could pursue and when timing of this would be. 2.  Continue to monitor LFTs 3.  Would recommend starting antibiotics giving rise in white count and need to delay procedure 4.  Patient can be on a clear liquid diet today if she would like 5.  Increased Pantoprazole to 40 mg twice daily given patient's extensive history and epigastric pain.  Thank you for your kind consultation, we will continue to follow.  Lavone Nian [facility]  11/29/2021, 8:33 AM     [facility] Attending   I have taken an interval history, reviewed the chart and examined the patient. I agree with the Advanced Practitioner's note, impression and recommendations.  Majority the medical decision-making in the formulation of the assessment and plan were performed by me.  This patient has cholelithiasis and choledocholithiasis which may be symptomatic.  I am not sure we can say if this is driving her problems.  Nevertheless it is prudent to attempt removal of the common bile duct stones.  This is technically challenging given her altered anatomy status post Billroth II surgery.  Fortunately Dr. Stefani Dama Roddy may be able to try tomorrow afternoon.  That is what we are tentatively planning for, ERCP with stone extraction.  Continue current treatment for now.  The PA has mentioned the possibility of starting antibiotics.  She does have a leukocytosis but no other overt signs of infection so I will defer to the hospitalist on this.  Gatha Mayer, MD, [facility] 11/29/2021 3:10 PM

## 2021-11-29 NOTE — Consult Note (Addendum)
Consultation  Referring Provider: Dr. Algis Liming  Primary Care Physician:  Janie Morning, DO Primary Gastroenterologist: Dr. Hilarie Fredrickson       Reason for Consultation: Choledocholithiasis            HPI:   Judith [name] is a 80 y.o. female with past medical history significant for normocytic anemia, osteoarthritis, biliary gastritis, cervical spine DDD, type 2 diabetes with neuropathy, depression, osteoporosis, history of partial gastrectomy (BII anatomy), GERD, gastroparesis, gastric and duodenal ulcers, gallstones and multiple others, who presented to the ED on 11/28/2021 with abdominal pain and nausea which had started earlier.    Today, the patient tells me she was feeling fine until around 5 AM yesterday, 11/28/2021 when she awoke with nausea and started vomiting.  Explains that she went then went to the bathroom and had 3 "very large solid" bowel movements.  Tells me she had eaten spaghetti the night before, but all she vomited was stomach acid.  She continued to vomit throughout the day and presented to the ER.  Along with this notes epigastric pain.  Describes her history of previous ulcer disease and feels like it is the same.  Tells me now she remains nauseous regardless of Zofran but has not vomited since yesterday.  Also describes her history of gastroparesis but apparently has never been on medication for this at home.    Denies fever, chills, blood in her stool or weight loss.  ED course: BP 189/78, CBC with a white count of 15.3, hemoglobin 16, platelets 283, lipase normal, CT of the abdomen pelvis with contrast showed cholelithiasis and choledocholithiasis up to 1.1 cm  GI history: 02/11/2018 colonoscopy: Small internal hemorrhoids and otherwise normal 12/12/2017 EUS: With large amount of retained solid food in the stomach, apparent Billroth II anatomy with adjacent gastric and jejunal inflammation but no frank cancer, unable to visualize the head of the pancreas, extrahepatic bile ducts  due to her postsurgical anatomy, LFTs thought possibly increased due to extreme Tylenol use and she was told to discontinue 11/11/2017 office visit with Dr. Hilarie Fredrickson: That time discussed gastroparesis which improved with small more frequent meals and a modified diet, also had some elevated LFTs and discussed GERD using Pantoprazole 40 daily, Ranitidine 150 mg in the evening and Carafate 4 times a day  Past Medical History:  Diagnosis Date   Anemia    Arthritis    Biliary gastritis    Blood transfusion without reported diagnosis    2007   Cervical spine arthritis 04/05/2016   Controlled type 2 diabetes mellitus with diabetic neuropathy, with long-term current use of insulin ([place])    Depression    Diabetes mellitus type 2 with complications ([place])    Dilated bile duct    Duodenal ulcer disease    Encounter for long-term (current) use of insulin ([place])    Gallstones 01/17/2018   Gastric ulcer    Gastritis and gastroduodenitis    Gastroparesis    severe   GERD (gastroesophageal reflux disease)    Gout    tested for gout but was not   Heart murmur    Hyperlipidemia    in past not now   Hypertension    Hypokalemia, gastrointestinal losses    secondary to severe gastroparesis   Long-term insulin use in type 2 diabetes ([place]) 04/05/2016   Osteoporosis    just had bone density test possible    Peripheral neuropathy    Presence of cardiac pacemaker    S/P partial gastrectomy  due to severe gastric and gudoenal ulcers   Seasonal allergies    Sleep apnea    could not use CPAP   UTI (urinary tract infection)     Past Surgical History:  Procedure Laterality Date   COLONOSCOPY  02/11/2018   no polyps, + small internal hemorrhoids   EUS N/A 12/12/2017   Procedure: UPPER ENDOSCOPIC ULTRASOUND (EUS) RADIAL;  Surgeon: [name], MD;  Location: [facility];  Service: Endoscopy;  Laterality: N/A;   GASTRECTOMY     JOINT REPLACEMENT     pace maker     PARTIAL GASTRECTOMY      TONSILLECTOMY     90 years ago   UPPER GASTROINTESTINAL ENDOSCOPY      Family History  Problem Relation Age of Onset   Diabetes Mother    Diabetes Father    Heart disease Father    Diabetes Sister    Heart disease Son    Colon cancer Neg Hx    Colon polyps Neg Hx    Rectal cancer Neg Hx    Stomach cancer Neg Hx    Esophageal cancer Neg Hx     Social History   Tobacco Use   Smoking status: Never   Smokeless tobacco: Never  Vaping Use   Vaping Use: Never used  Substance Use Topics   Alcohol use: No    Alcohol/week: 0.0 standard drinks   Drug use: No    Prior to Admission medications   Medication Sig Start Date End Date Taking? Authorizing Provider  Biotin 1000 MCG tablet Take 1,000 mcg by mouth daily.     [provider]  blood glucose meter kit and supplies KIT Dispense based on patient and insurance preference. Check capillary glucose three times a day, before meals.  Dx E11.65, Z79.4 10/29/17   Jacelyn Pi, Lilia Argue, MD  carvedilol (COREG) 25 MG tablet Take 1 tablet (25 mg total) by mouth 2 (two) times daily with a meal. 01/22/19   Jacelyn Pi, Lilia Argue, MD  colchicine 0.6 MG tablet Take 0.6 mg by mouth daily.     [provider]  DULoxetine (CYMBALTA) 60 MG capsule TAKE 1 CAPSULE (60 MG TOTAL) DAILY BY MOUTH. 04/28/18   Jacelyn Pi, Lilia Argue, MD  famotidine (PEPCID) 20 MG tablet Take 1 tablet (20 mg total) by mouth at bedtime. 06/11/19   Jacelyn Pi, Lilia Argue, MD  gabapentin (NEURONTIN) 100 MG capsule Take 1 capsule (100 mg) every morning and afternoon, and take 4 capsules (480m) at bedtime 04/03/18   SJacelyn Pi ILilia Argue MD  glucose blood (ONE TOUCH ULTRA TEST) test strip USE TO CHECK GLUCOSE LEVELS 3 TIMES A DAY, DX E11.9, Z79.4 11/30/17   SJacelyn Pi ILilia Argue MD  [facility] UN[facility]/ML KwikPen CHECK FSBS BEFORE [facility] CenterME[facility] AND INJECT PER SSI(0-80.0UNITS)(80-149,5U)(150-180,9U)(181-500,CALL MD) 09/16/19   SJacelyn Pi ILilia Argue MD  insulin glargine  (LANTUS SOLOSTAR) 100 UNIT/ML Solostar Pen Inject 37 Units into the skin daily at 10 pm. 08/23/20   SJacelyn Pi ILilia Argue MD  insulin lispro (HUMALOG) 100 UNIT/ML injection Inject 1-10 Units into the skin 3 (three) times daily before meals. Sliding scale    [provider]  Insulin Pen Needle (B-D ULTRAFINE III SHORT PEN) 31G X 8 MM MISC To use as directed with insulin, four times a day. Dx E11.9, Z79.4 11/08/17   SJacelyn Pi ILilia Argue MD  lidocaine (LIDODERM) 5 % Place 1 patch onto the skin daily. Remove & Discard patch  within 12 hours or as directed by MD 06/06/21   Gareth Morgan, MD  losartan (COZAAR) 100 MG tablet Take 100 mg by mouth daily.     [provider]  Multiple Vitamin (MULTIVITAMIN) tablet Take 1 tablet by mouth daily.     [provider]  pantoprazole (PROTONIX) 40 MG tablet TAKE 1 TABLET (40 MG TOTAL) DAILY BY MOUTH. 04/28/18   Jacelyn Pi, Lilia Argue, MD  rosuvastatin (CRESTOR) 10 MG tablet Take 1 tablet (10 mg total) by mouth daily. 11/15/20   Just, Laurita Quint, FNP    Current Facility-Administered Medications  Medication Dose Route Frequency Provider Last Rate Last Admin   carvedilol (COREG) tablet 25 mg  25 mg Oral BID WC Reubin Milan, MD   25 mg at 11/29/21 0813   HYDROmorphone (DILAUDID) injection 0.5 mg  0.5 mg Intravenous Q2H PRN Reubin Milan, MD   0.5 mg at 11/29/21 1308   labetalol (NORMODYNE) injection 20 mg  20 mg Intravenous Q2H PRN Reubin Milan, MD       lactated ringers infusion   Intravenous Continuous Reubin Milan, MD 100 mL/hr at 11/29/21 0430 New Bag at 11/29/21 0430   melatonin tablet 3 mg  3 mg Oral QHS Kathryne Eriksson, NP   3 mg at 11/28/21 2113   ondansetron (ZOFRAN) tablet 4 mg  4 mg Oral Q6H PRN Reubin Milan, MD       Or   ondansetron [facility]) injection 4 mg  4 mg Intravenous Q6H PRN Reubin Milan, MD   4 mg at 11/29/21 0429   pantoprazole (PROTONIX) injection 40 mg  40 mg Intravenous Q24H  Reubin Milan, MD   40 mg at 11/28/21 1929    Allergies as of 11/28/2021 - Review Complete 11/28/2021  Allergen Reaction Noted   Asa [aspirin] Other (See Comments) 12/20/2015   Tylenol [acetaminophen]  04/22/2018   Sulfa antibiotics Rash 12/20/2015     Review of Systems:    Constitutional: No weight loss, fever or chills Skin: No rash  Cardiovascular: No chest pain Respiratory: No SOB  Gastrointestinal: See HPI and otherwise negative Genitourinary: No dysuria Neurological: No headache, dizziness or syncope Musculoskeletal: No new muscle or joint pain Hematologic: No bleeding  Psychiatric: No history of depression or anxiety    Physical Exam:  Vital signs in last 24 hours: Temp:  [97.2 F (36.2 C)-99.3 F (37.4 C)] 98.5 F (36.9 C) (12/07 0545) Pulse Rate:  [64-98] 83 (12/07 0545) Resp:  [13-20] 18 (12/06 1847) BP: (105-194)/(59-125) 144/59 (12/07 0545) SpO2:  [94 %-100 %] 98 % (12/07 0545) Weight:  [59 kg] 59 kg (12/06 1348) Last BM Date: 10/28/21 General:   Pleasant Elderly Caucasian female appears to be in NAD, Well developed, Well nourished, alert and cooperative Head:  Normocephalic and atraumatic. Eyes:   PEERL, EOMI. No icterus. Conjunctiva pink. Ears:  Normal auditory acuity. Neck:  Supple Throat: Oral cavity and pharynx without inflammation, swelling or lesion.  Lungs: Respirations even and unlabored. Lungs clear to auscultation bilaterally.   No wheezes, crackles, or rhonchi.  Heart: Normal S1, S2. No MRG. Regular rate and rhythm. No peripheral edema, cyanosis or pallor.  Abdomen:  Soft, nondistended, moderate epigastric TTP with involuntary guarding, normal bowel sounds. No appreciable masses or hepatomegaly. Rectal:  Not performed.  Msk:  Symmetrical without gross deformities. Peripheral pulses intact.  Extremities:  Without edema, no deformity or joint abnormality.  Neurologic:  Alert and  oriented x4;  grossly  normal neurologically. Skin:   Dry and  intact without significant lesions or rashes. Psychiatric: Demonstrates good judgement and reason without abnormal affect or behaviors.  LAB RESULTS: Recent Labs    11/28/21 1417 11/28/21 1439 11/29/21 0458  WBC 15.3*  --  16.9*  HGB 16.0* 17.0* 12.7  HCT 48.3* 50.0* 39.2  PLT 283  --  199   BMET Recent Labs    11/28/21 1417 11/28/21 1439 11/29/21 0458  NA 133* 135 133*  K 4.9 4.9 3.4*  CL 94* 97* 98  CO2 24  --  25  GLUCOSE 296* 296* 279*  BUN _0 CREATININE 0.79 0.70 0.73  CALCIUM 9.7  --  8.9   Hepatic Function Latest Ref Rng & Units 11/29/2021 11/28/2021 02/14/2021  Total Protein 6.5 - 8.1 g/dL 6.4(L) 8.3(H) 6.7  Albumin 3.5 - 5.0 g/dL 3.7 4.7 4.2  AST 15 - 41 U/L 138(H) 33 21  ALT 0 - 44 U/L 43 14 10  Alk Phosphatase 38 - 126 U/L 96 66 78  Total Bilirubin 0.3 - 1.2 mg/dL 1.3(H) 1.5(H) 0.5  Bilirubin, Direct 0.0 - 0.3 mg/dL - - -    STUDIES: CT ABDOMEN PELVIS W CONTRAST  Result Date: 11/28/2021 CLINICAL DATA:  Nonlocalized acute abdominal pain. EXAM: CT ABDOMEN AND PELVIS WITH CONTRAST TECHNIQUE: Multidetector CT imaging of the abdomen and pelvis was performed using the standard protocol following bolus administration of intravenous contrast. CONTRAST:  47m OMNIPAQUE IOHEXOL 350 MG/ML SOLN COMPARISON:  None. FINDINGS: Lower chest: Partially visualized cardiac leads. Query mitral annular calcifications. No acute abnormality. Tiny hiatal hernia. Hepatobiliary: No focal liver abnormality. Calcified gallstone noted within the gallbladder lumen. No gallbladder wall thickening or pericholecystic fluid. The main pancreatic duct is enlarged in caliber measuring up to 1.3 cm with several gallstone noted within its lumen (largest measuring up to 1.1 cm). Pancreas: No focal lesion. Normal pancreatic contour. No surrounding inflammatory changes. No main pancreatic ductal dilatation. Spleen: Normal in size without focal abnormality. Adrenals/Urinary Tract: No adrenal nodule  bilaterally. Bilateral kidneys enhance symmetrically. Bilateral renal cortical scarring. Fluid density lesion within left kidney likely represents a simple renal cyst and measures up to 1.8 cm. Subcentimeter hypodensities are too small to characterize. No hydronephrosis. No hydroureter. The urinary bladder is unremarkable. On delayed imaging, there is no urothelial wall thickening and there are no filling defects in the opacified portions of the bilateral collecting systems or ureters. Stomach/Bowel: Partial gastrectomy surgical changes noted. Stomach is within normal limits. No evidence of bowel wall thickening or dilatation. Appendix appears normal. Vascular/Lymphatic: No abdominal aorta or iliac aneurysm. Severe atherosclerotic plaque of the aorta and its branches. No abdominal, pelvic, or inguinal lymphadenopathy. Reproductive: Uterus and bilateral adnexa are unremarkable. Other: No intraperitoneal free fluid. No intraperitoneal free gas. No organized fluid collection. Musculoskeletal: No abdominal wall hernia or abnormality. No suspicious lytic or blastic osseous lesions. No acute displaced fracture. Levoscoliosis of the thoracolumbar spine. Multilevel degenerative changes of the spine. IMPRESSION: 1. Cholelithiasis and choledocholithiasis (up to 1.1cm). 2.  Aortic Atherosclerosis (ICD10-I70.0) - severe. Electronically Signed   By: MIven FinnM.D.   On: 11/28/2021 15:51      Impression / Plan:   Impression: 1.  Choledocholithiasis/cholelithiasis: Patient with abdominal pain, nausea and vomiting and a CT showing Choleydocholithiasis up to 1.1 cm, bili 1.5--> 1.3 overnight, alk phos 33--> 138, other LFTs normal, patient with known Billroth II anatomy, unsuccessful EUS in the past; we will need to consider ERCP versus  other interventions as this would likely be technically difficult, could also be element of gastroparesis+/- gastritis/PUD 2.  Type 2 diabetes 3.  Peripheral neuropathy 4.   Hypertension  Plan: 1.  Patient does have altered anatomy and due to this ERCP will be more complicated.  I will communicate with Dr. Rush Landmark our biliary specialist to see if this is something he could pursue and when timing of this would be. 2.  Continue to monitor LFTs 3.  Would recommend starting antibiotics giving rise in white count and need to delay procedure 4.  Patient can be on a clear liquid diet today if she would like 5.  Increased Pantoprazole to 40 mg twice daily given patient's extensive history and epigastric pain.  Thank you for your kind consultation, we will continue to follow.  Lavone Nian [facility]  11/29/2021, 8:33 AM     [facility] Attending   I have taken an interval history, reviewed the chart and examined the patient. I agree with the Advanced Practitioner's note, impression and recommendations.  Majority the medical decision-making in the formulation of the assessment and plan were performed by me.  This patient has cholelithiasis and choledocholithiasis which may be symptomatic.  I am not sure we can say if this is driving her problems.  Nevertheless it is prudent to attempt removal of the common bile duct stones.  This is technically challenging given her altered anatomy status post Billroth II surgery.  Fortunately Dr. Stefani Dama Roddy may be able to try tomorrow afternoon.  That is what we are tentatively planning for, ERCP with stone extraction.  Continue current treatment for now.  The PA has mentioned the possibility of starting antibiotics.  She does have a leukocytosis but no other overt signs of infection so I will defer to the hospitalist on this.  Gatha Mayer, MD, [facility] 11/29/2021 3:10 PM

## 2021-11-30 ENCOUNTER — Inpatient Hospital Stay (HOSPITAL_COMMUNITY): Payer: Medicare Other | Admitting: Certified Registered Nurse Anesthetist

## 2021-11-30 ENCOUNTER — Inpatient Hospital Stay (HOSPITAL_COMMUNITY): Payer: Medicare Other

## 2021-11-30 ENCOUNTER — Encounter (HOSPITAL_COMMUNITY)
Admission: EM | Disposition: A | Discharge status: Skilled Nursing Facility | Payer: Self-pay | Source: Skilled Nursing Facility | Attending: Internal Medicine

## 2021-11-30 ENCOUNTER — Encounter (HOSPITAL_COMMUNITY): Payer: Self-pay | Admitting: Internal Medicine

## 2021-11-30 DIAGNOSIS — K838 Other specified diseases of biliary tract: Secondary | ICD-10-CM

## 2021-11-30 DIAGNOSIS — K3189 Other diseases of stomach and duodenum: Secondary | ICD-10-CM

## 2021-11-30 HISTORY — PX: BIOPSY: SHX5522

## 2021-11-30 HISTORY — PX: ENDOSCOPIC RETROGRADE CHOLANGIOPANCREATOGRAPHY (ERCP) WITH PROPOFOL: SHX5810

## 2021-11-30 HISTORY — PX: SUBMUCOSAL TATTOO INJECTION: SHX6856

## 2021-11-30 HISTORY — PX: BILIARY STENT PLACEMENT: SHX5538

## 2021-11-30 HISTORY — PX: SPHINCTEROTOMY: SHX5544

## 2021-11-30 HISTORY — PX: ESOPHAGOGASTRODUODENOSCOPY (EGD) WITH PROPOFOL: SHX5813

## 2021-11-30 LAB — CBC
HCT: 36.8 % (ref 36.0–46.0)
Hemoglobin: 11.9 g/dL — ABNORMAL LOW (ref 12.0–15.0)
MCH: 31 pg (ref 26.0–34.0)
MCHC: 32.3 g/dL (ref 30.0–36.0)
MCV: 95.8 fL (ref 80.0–100.0)
Platelets: 192 10*3/uL (ref 150–400)
RBC: 3.84 MIL/uL — ABNORMAL LOW (ref 3.87–5.11)
RDW: 14.3 % (ref 11.5–15.5)
WBC: 15.2 10*3/uL — ABNORMAL HIGH (ref 4.0–10.5)
nRBC: 0 % (ref 0.0–0.2)

## 2021-11-30 LAB — COMPREHENSIVE METABOLIC PANEL
ALT: 27 U/L (ref 0–44)
AST: 49 U/L — ABNORMAL HIGH (ref 15–41)
Albumin: 3.2 g/dL — ABNORMAL LOW (ref 3.5–5.0)
Alkaline Phosphatase: 72 U/L (ref 38–126)
Anion gap: 7 (ref 5–15)
BUN: 13 mg/dL (ref 8–23)
CO2: 29 mmol/L (ref 22–32)
Calcium: 8.4 mg/dL — ABNORMAL LOW (ref 8.9–10.3)
Chloride: 99 mmol/L (ref 98–111)
Creatinine, Ser: 0.81 mg/dL (ref 0.44–1.00)
GFR, Estimated: >60 mL/min (ref >60)
Glucose, Bld: 182 mg/dL — ABNORMAL HIGH (ref 70–99)
Potassium: 3.7 mmol/L (ref 3.5–5.1)
Sodium: 135 mmol/L (ref 135–145)
Total Bilirubin: 0.9 mg/dL (ref 0.3–1.2)
Total Protein: 5.7 g/dL — ABNORMAL LOW (ref 6.5–8.1)

## 2021-11-30 LAB — GLUCOSE, CAPILLARY
Glucose-Capillary: 120 mg/dL — ABNORMAL HIGH (ref 70–99)
Glucose-Capillary: 154 mg/dL — ABNORMAL HIGH (ref 70–99)
Glucose-Capillary: 162 mg/dL — ABNORMAL HIGH (ref 70–99)
Glucose-Capillary: 215 mg/dL — ABNORMAL HIGH (ref 70–99)
Glucose-Capillary: 259 mg/dL — ABNORMAL HIGH (ref 70–99)
Glucose-Capillary: 265 mg/dL — ABNORMAL HIGH (ref 70–99)

## 2021-11-30 SURGERY — ENDOSCOPIC RETROGRADE CHOLANGIOPANCREATOGRAPHY (ERCP) WITH PROPOFOL
Anesthesia: General

## 2021-11-30 MED ORDER — INDOMETHACIN 50 MG RE SUPP: 100.0000 mg | Freq: Once | RECTAL | Status: DC

## 2021-11-30 MED ORDER — LIDOCAINE 2% (20 MG/ML) 5 ML SYRINGE
INTRAMUSCULAR | Status: DC | PRN
  Administered 2021-11-30: 80 mg via INTRAVENOUS

## 2021-11-30 MED ORDER — GLUCAGON HCL RDNA (DIAGNOSTIC) 1 MG IJ SOLR
INTRAMUSCULAR | Status: DC | PRN
  Administered 2021-11-30 (×7): .25 mg via INTRAVENOUS

## 2021-11-30 MED ORDER — FENTANYL CITRATE (PF) 100 MCG/2ML IJ SOLN
INTRAMUSCULAR | Status: DC | PRN
  Administered 2021-11-30 (×2): 50 ug via INTRAVENOUS

## 2021-11-30 MED ORDER — LACTATED RINGERS IV SOLN: INTRAVENOUS | Status: DC

## 2021-11-30 MED ORDER — PHENYLEPHRINE HCL-NACL 20-0.9 MG/250ML-% IV SOLN
INTRAVENOUS | Status: DC | PRN
  Administered 2021-11-30: 15 ug/min via INTRAVENOUS

## 2021-11-30 MED ORDER — INDOMETHACIN 50 MG RE SUPP
RECTAL | Status: DC | PRN
  Administered 2021-11-30: 100 mg via RECTAL

## 2021-11-30 MED ORDER — ONDANSETRON HCL 4 MG/2ML IJ SOLN: 4.0000 mg | Freq: Once | INTRAMUSCULAR | Status: DC | PRN

## 2021-11-30 MED ORDER — PROPOFOL 10 MG/ML IV BOLUS
INTRAVENOUS | Status: DC | PRN
  Administered 2021-11-30: 130 mg via INTRAVENOUS

## 2021-11-30 MED ORDER — PHENYLEPHRINE HCL (PRESSORS) 10 MG/ML IV SOLN
INTRAVENOUS | Status: AC
  Filled 2021-11-30: qty 1

## 2021-11-30 MED ORDER — FENTANYL CITRATE (PF) 100 MCG/2ML IJ SOLN: 25.0000 ug | INTRAMUSCULAR | Status: DC | PRN

## 2021-11-30 MED ORDER — INDOMETHACIN 50 MG RE SUPP
RECTAL | Status: AC
  Filled 2021-11-30: qty 2

## 2021-11-30 MED ORDER — ROCURONIUM BROMIDE 10 MG/ML (PF) SYRINGE
PREFILLED_SYRINGE | INTRAVENOUS | Status: DC | PRN
  Administered 2021-11-30: 30 mg via INTRAVENOUS

## 2021-11-30 MED ORDER — DEXAMETHASONE SODIUM PHOSPHATE 10 MG/ML IJ SOLN
INTRAMUSCULAR | Status: DC | PRN
  Administered 2021-11-30: 5 mg via INTRAVENOUS

## 2021-11-30 MED ORDER — SUCCINYLCHOLINE CHLORIDE 200 MG/10ML IV SOSY
PREFILLED_SYRINGE | INTRAVENOUS | Status: DC | PRN
  Administered 2021-11-30: 100 mg via INTRAVENOUS

## 2021-11-30 MED ORDER — SODIUM CHLORIDE 0.9 % IV SOLN
INTRAVENOUS | Status: DC | PRN
  Administered 2021-11-30: 75 mL

## 2021-11-30 MED ORDER — CIPROFLOXACIN IN D5W 400 MG/200ML IV SOLN
INTRAVENOUS | Status: AC
  Filled 2021-11-30: qty 200

## 2021-11-30 MED ORDER — SODIUM CHLORIDE 0.9 % IV SOLN: INTRAVENOUS | Status: DC

## 2021-11-30 MED ORDER — CIPROFLOXACIN IN D5W 400 MG/200ML IV SOLN
400.0000 mg | Freq: Once | INTRAVENOUS | Status: AC
  Administered 2021-11-30: 400 mg via INTRAVENOUS

## 2021-11-30 MED ORDER — SUGAMMADEX SODIUM 200 MG/2ML IV SOLN
INTRAVENOUS | Status: DC | PRN
  Administered 2021-11-30: 120 mg via INTRAVENOUS

## 2021-11-30 MED ORDER — FENTANYL CITRATE (PF) 100 MCG/2ML IJ SOLN
INTRAMUSCULAR | Status: AC
  Filled 2021-11-30: qty 2

## 2021-11-30 MED ORDER — ONDANSETRON HCL 4 MG/2ML IJ SOLN
INTRAMUSCULAR | Status: DC | PRN
  Administered 2021-11-30: 4 mg via INTRAVENOUS

## 2021-11-30 MED ORDER — SPOT INK MARKER SYRINGE KIT
PACK | SUBMUCOSAL | Status: DC | PRN
  Administered 2021-11-30: 3 mL via SUBMUCOSAL

## 2021-11-30 MED ORDER — GLUCAGON HCL RDNA (DIAGNOSTIC) 1 MG IJ SOLR
INTRAMUSCULAR | Status: AC
  Filled 2021-11-30: qty 1

## 2021-11-30 NOTE — Anesthesia Procedure Notes (Signed)
Procedure Name: Intubation Date/Time: 11/30/2021 2:56 PM Performed by: Cynda Familia, CRNA Pre-anesthesia Checklist: Patient identified, Emergency Drugs available, Suction available and Patient being monitored Patient Re-evaluated:Patient Re-evaluated prior to induction Oxygen Delivery Method: Circle System Utilized Preoxygenation: Pre-oxygenation with 100% oxygen Induction Type: IV induction, Rapid sequence and Cricoid Pressure applied Ventilation: Mask ventilation without difficulty Grade View: Grade IV Tube type: Oral Number of attempts: 1 Airway Equipment and Method: Stylet Placement Confirmation: ETT inserted through vocal cords under direct vision, positive ETCO2 and breath sounds checked- equal and bilateral Secured at: 21 cm Tube secured with: Tape Dental Injury: Teeth and Oropharynx as per pre-operative assessment  Difficulty Due To: Difficulty was anticipated, Difficult Airway- due to reduced neck mobility, Difficult Airway- due to anterior larynx, Difficult Airway- due to limited oral opening and Difficult Airway- due to dentition Future Recommendations: Recommend- induction with short-acting agent, and alternative techniques readily available Comments: DIFFICULT INTUBATION-- DLX1 WITH MILLER 2 - NO VIEW- GLIDESCOPE (3)- atraumatic- poor dentition esp bottom- all unchanged with intubation- bilat BS Rose

## 2021-11-30 NOTE — Op Note (Signed)
[facility] Patient Name: Judith [name] Procedure Date: 11/30/2021 MRN: [id number] Attending MD: Justice Britain , MD Date of Birth: 12/15/1941 CSN: [id number] Age: 80 Admit Type: Outpatient Procedure:                ERCP Indications:              Bile duct stone(s), Abnormal MRCP, Elevated liver                            enzymes, with history of Bilroth 2 anatomy Providers:                Justice Britain, MD, Carlyn Reichert, RN, Cherylynn Ridges, Technician, Frazier Richards, Technician, Marla Roe, CRNA Referring MD:             Gatha Mayer, MD, [facility] Team Medicines:                General Anesthesia, Cipro 400 mg IV, Indomethacin                            100 mg PR, Glucagon 3.55 mg IV Complications:            No immediate complications. Estimated Blood Loss:     Estimated blood loss was minimal. Procedure:                Pre-Anesthesia Assessment:                           - Prior to the procedure, a History and Physical                            was performed, and patient medications and                            allergies were reviewed. The patient's tolerance of                            previous anesthesia was also reviewed. The risks                            and benefits of the procedure and the sedation                            options and risks were discussed with the patient.                            All questions were answered, and informed consent                            was obtained. Prior Anticoagulants: The patient has  taken no previous anticoagulant or antiplatelet                            agents. ASA Grade Assessment: III - A patient with                            severe systemic disease. After reviewing the risks                            and benefits, the patient was deemed in                            satisfactory condition to undergo the  procedure.                           After obtaining informed consent, the scope was                            passed under direct vision. Throughout the                            procedure, the patient's blood pressure, pulse, and                            oxygen saturations were monitored continuously. The                            [id number]-[id number] ([id number]) Olympus therapeutic endoscope                            was introduced through the mouth, and used to                            inject contrast into without successful                            cannulation. The [id number]-[id number] ([id number]) Olympus                            duodenoscope was introduced through the mouth, and                            used to inject contrast into and used to inject                            contrast into the bile duct. Findings:      The scout film was normal other than pacemaker lead noted.      The esophagus was successfully intubated under direct vision without       detailed examination of the pharynx, larynx, and associated structures.       A therapeutic esophagogastroduodenoscopy scope was used for the       examination of the upper gastrointestinal tract initially. The scope was       passed under direct vision through the upper GI tract.  No gross lesions       were noted in the entire esophagus. The Z-line was regular and was found       38 cm from the incisors. Diffuse severely erythematous mucosa without       bleeding was found in the entire examined stomach - this was biopsied       for HP evaluation. 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 The gastrojejunal anastomosis was characterized by       inflammation. This was traversed. The efferent limb was examined. The       afferent limb was examined - this was tattooed for demarcation purposes       in the future. While going through the jejunum, this was felt to be       normal, though significant angulations were  noted. Using fluoroscopy I       was able to traverse this area safely into the duodenum. Multiople       medium non-bleeding diverticulae were found in the second/third/fourth       portion of the duodenum making passage difficult without getting the       scope into the diverticulae. I was able to find the pylorus in       retrograde. Coming back, I found the major papilla which was bulging and       very floppy.      Using the therapeutic EGD scope for nearly an hour I could not cannulate       the bile duct with the Hydratome sphincterotome and Billroth II       sphincterotomes.      I transitioned to the Duodenoscope and very carefully used fluroscopy to       enter into the region of the duodenum. The bile duct could not be       cannulated with the Hydratome sphincterotome and Billroth II       sphincterotome.      Decision made to try a fistulotomy. A biliary pre-cut fistultomy       measuring 10 mm in length was made with a monofilament needle knife       using a freehand technique using ERBE electrocautery. There was no       post-sphincterotomy bleeding. Bile was noted to flow. A long 0.035 inch       Soft Jagwire was passed into the biliary tree but kept moving towards       the cystic duct and the gallbladder region. I tried a double wire       technique to try and get the wire into the biliary duct but this did not       help. After removing the prior wire and extending the fistulotomy       further, I was able to get the wire to pass into the biliary tree. The       bile duct was deeply cannulated with the needle knife. Contrast was       injected. I personally interpreted the bile duct images. Ductal flow of       contrast was adequate. Image quality was adequate. Contrast extended to       the bifurcation. The main bile duct contained filling defects thought to       be stones and sludge. The main bile duct was moderately dilated. The       largest diameter was 14 mm. The  biliary sphincterotomy was extended just       a bit further with a monofilament needle knife using a freehand       technique using ERBE electrocautery. There was no post-sphincterotomy       bleeding. To discover objects, the biliary tree was swept. Sludge was       swept from the duct. Due to the overall length of time for today's       procedure, it was decided that we needed to complete the procedure. One       10 Fr by 5 cm plastic biliary stent with a single external flap and a       single internal flap was placed into the common bile duct. Bile and       sludge flowed through the stent. The stent was in good position.      A pancreatogram was not performed.      The duodenoscope was withdrawn from the patient. Impression:               - No gross lesions in esophagus. Z-line regular, 38                            cm from the incisors.                           - Erythematous mucosa in the stomach - biopsied.                           - Patent Billroth II gastrojejunostomy was found,                            characterized by inflammation.                           - Efferent limb normal in appearance.                           - Afferent limb normal in appearance but                            significant tortuosity. Tattooed for demarcation                            purposes in future.                           - Multiple non-bleeding duodenal diverticulae noted.                           - The major papilla appeared to be bulging and very                            floppy.                           - As noted above, extremely difficult ERCP  cannulation.                           - Eventually after Therapeutic EGD attempt,                            Duodenoscope attempt with mutliple wires and                            sphincterotomes being unsuccessful, we proceeded                            with a Biliary Fistulotomy. Eventual cannulation of                             the bile duct was made.                           - Filling defects consistent with stones and sludge                            were seen on the cholangiogram.                           - The entire main bile duct was moderately dilated.                           - The examination was suspicious for                            choledocholithiasis.                           - The biliary tree was swept and sludge was found.                           - One plastic biliary stent was placed into the                            common bile duct. Moderate Sedation:      Not Applicable - Patient had care per Anesthesia. Recommendation:           - The patient will be observed post-procedure,                            until all discharge criteria are met.                           - Return patient to hospital ward for ongoing care.                           - Clear liquid diet.                           - Observe patient's clinical course.                           -  Check liver enzymes (AST, ALT, alkaline                            phosphatase, bilirubin) in the morning.                           - Await path results.                           - Watch for pancreatitis, bleeding, perforation,                            and cholangitis.                           - Repeat ERCP in 2 months to remove stent.                           - Discuss case with surgery to proceed with                            cholecystectomy when able for definitive treatment.                           - The findings and recommendations were discussed                            with the patient.                           - The findings and recommendations were discussed                            with the patient's family.                           - The findings and recommendations were discussed                            with the referring physician. Procedure Code(s):        --- Professional  ---                           [phone], 73, Endoscopic retrograde                            cholangiopancreatography (ERCP); with placement of                            endoscopic stent into biliary or pancreatic duct,                            including pre- and post-dilation and guide wire                            passage, when performed, including sphincterotomy,  when performed, each stent                           43264, 33, Endoscopic retrograde                            cholangiopancreatography (ERCP); with removal of                            calculi/debris from biliary/pancreatic duct(s) Diagnosis Code(s):        --- Professional ---                           K31.89, Other diseases of stomach and duodenum                           Z98.0, Intestinal bypass and anastomosis status                           K83.8, Other specified diseases of biliary tract                           R93.2, Abnormal findings on diagnostic imaging of                            liver and biliary tract                           K80.50, Calculus of bile duct without cholangitis                            or cholecystitis without obstruction                           R74.8, Abnormal levels of other serum enzymes                           K57.10, Diverticulosis of small intestine without                            perforation or abscess without bleeding CPT copyright 2019 [employer]. All rights reserved. The codes documented in this report are preliminary and upon coder review may  be revised to meet current compliance requirements. Justice Britain, MD 11/30/2021 6:21:48 PM Number of Addenda: 0

## 2021-11-30 NOTE — Anesthesia Postprocedure Evaluation (Signed)
Anesthesia Post Note  Patient: Durinda Buzzelli  Procedure(s) Performed: ENDOSCOPIC RETROGRADE CHOLANGIOPANCREATOGRAPHY (ERCP) WITH PROPOFOL ESOPHAGOGASTRODUODENOSCOPY (EGD) WITH PROPOFOL BIOPSY SPHINCTEROTOMY BILIARY STENT PLACEMENT SUBMUCOSAL TATTOO INJECTION     Patient location during evaluation: PACU Anesthesia Type: General Level of consciousness: awake and alert Pain management: pain level controlled Vital Signs Assessment: post-procedure vital signs reviewed and stable Respiratory status: spontaneous breathing, nonlabored ventilation, respiratory function stable and patient connected to nasal cannula oxygen Cardiovascular status: blood pressure returned to baseline and stable Postop Assessment: no apparent nausea or vomiting Anesthetic complications: no   No notable events documented.  Last Vitals:  Vitals:   11/30/21 1359 11/30/21 1812  BP: (!) 168/61 (!) 186/83  Pulse: 78 71  Resp: 10 20  Temp: 36.9 C 36.9 C  SpO2: 92% 93%    Last Pain:  Vitals:   11/30/21 1812  TempSrc:   PainSc: Asleep                 Caledonia Zou S

## 2021-11-30 NOTE — Progress Notes (Signed)
Brief procedure note.  Extremely difficult ERCP attempt. Patient has significant gastritis and this was biopsied. Gastrojejunostomy noted with inflammation present but no overt stenosis. Billroth II anatomy noted. Efferrent limb benign in appearance. Afferent limb with significant angulations.  This was able to be traversed eventually after using fluoroscopy into the duodenum with evaluation of the pylorus and retrograde.  Ampulla in difficult position.  Over an hour an attempt with therapeutic EGD unsuccessful. Transitioned with great care to duodena scope and after another hour and a half of trying eventually decided to do fistulotomy.  Able to access the cystic duct.  After significant further attempt able to access the biliary tree.  10 French 5 cm stent placed in the biliary tree.  Okay for clears if patient can tolerate. Monitor for pancreatitis. No VTE prophylaxis x48 hours. Seems reasonable to attempt cholecystectomy. ERCP follow-up in 6 to 10 weeks for stent removal, sphincterotomy extension, stone removal.  Monitor LFTs.  Inpatient GI team will continue to follow.  Justice Britain, MD [facility] Office # [phone]

## 2021-11-30 NOTE — Interval H&P Note (Signed)
History and Physical Interval Note:  11/30/2021 1:25 PM  Judith [name]  has presented today for surgery, with the diagnosis of choleodocholithesis.  The various methods of treatment have been discussed with the patient and family. After consideration of risks, benefits and other options for treatment, the patient has consented to  Procedure(s): ENDOSCOPIC RETROGRADE CHOLANGIOPANCREATOGRAPHY (ERCP) WITH PROPOFOL (N/A) as a surgical intervention.  The patient's history has been reviewed, patient examined, no change in status, stable for surgery.  I have reviewed the patient's chart and labs.  Questions were answered to the patient's satisfaction.    The risks of an ERCP were discussed at length, including but not limited to the risk of perforation, bleeding, abdominal pain, post-ERCP pancreatitis (while usually mild can be severe and even life threatening).  Patient with evidence of choledocholithiasis and abnormal LFTs.  History of Bilroth 2 makes attempt potentially more difficult.  Will attempt endoscopic approach via ERCP.  If unsuccessful then may require PTC/PBD or Device-Assisted ERCP at [facility].  Time will tell.  The risks of an ERCP were discussed at length, including but not limited to the risk of perforation, bleeding, abdominal pain, post-ERCP pancreatitis (while usually mild can be severe and even life threatening).   [employer]

## 2021-11-30 NOTE — Anesthesia Procedure Notes (Deleted)
Spinal  Patient location during procedure: OR Start time: 11/30/2021 3:02 PM End time: 11/30/2021 3:07 PM Reason for block: surgical anesthesia Staffing Performed: anesthesiologist  Anesthesiologist: Myrtie Soman, MD Preanesthetic Checklist Completed: patient identified, IV checked, site marked, risks and benefits discussed, surgical consent, monitors and equipment checked, pre-op evaluation and timeout performed Spinal Block Patient position: sitting Prep: Betadine Patient monitoring: heart rate, continuous pulse ox and blood pressure Approach: midline Injection technique: single-shot Needle Needle type: Sprotte  Needle gauge: 24 G Needle length: 9 cm Assessment Sensory level: T6 Events: CSF return Additional Notes

## 2021-11-30 NOTE — Interval H&P Note (Signed)
History and Physical Interval Note:  11/30/2021 1:24 PM  Judith [name]  has presented today for surgery, with the diagnosis of choleodocholithesis.  The various methods of treatment have been discussed with the patient and family. After consideration of risks, benefits and other options for treatment, the patient has consented to  Procedure(s): ENDOSCOPIC RETROGRADE CHOLANGIOPANCREATOGRAPHY (ERCP) WITH PROPOFOL (N/A) as a surgical intervention.  The patient's history has been reviewed, patient examined, no change in status, stable for surgery.  I have reviewed the patient's chart and labs.  Questions were answered to the patient's satisfaction.    Patient with evidence of choledocholithiasis and abnormal LFTs.  History of Bilroth 2 makes attempt potentially more difficult.  Will attempt endoscopic approach via ERCP.  If unsuccessful then may require PTC/PBD or Device-Assisted ERCP at [facility].  Time will tell.  The risks of an ERCP were discussed at length, including but not limited to the risk of perforation, bleeding, abdominal pain, post-ERCP pancreatitis (while usually mild can be severe and even life threatening).   [employer]

## 2021-11-30 NOTE — Progress Notes (Signed)
PROGRESS NOTE   Judith [name]  AJO:[id number]    DOB: 05-Sep-1941    DOA: 11/28/2021  PCP: Janie Morning, DO   I have briefly reviewed patients previous medical records in [facility].  Chief Complaint  Patient presents with   Abdominal Pain   Emesis    Brief Narrative:   80 y.o. female with medical history significant of normocytic anemia, osteoarthritis, biliary gastritis, cervical spine DDD, controlled type II DM with neuropathy, depression, gout, hyperlipidemia, hypertension, osteoporosis, seasonal allergies, sleep apnea not on CPAP, history of UTI, history of partial gastrectomy, biliary gastritis, GERD, gastroparesis, duodenal and gastric ulcers, history of gallstones, dilated bile duct who is coming to the emergency department due to multiple episodes of emesis, nausea and abdominal pain on day of admission.  CT abdomen and pelvis with contrast showed cholelithiasis and choledocholithiasis up to 1.1 cm.  Admitted for suspected symptomatic choledocholithiasis/cholelithiasis, [facility] consulted and s/p extremely difficult ERCP attempt 12/8.   Assessment & Plan:  Principal Problem:   Choledocholithiasis Active Problems:   Peripheral neuropathy   Hypertension   Gout   Sleep apnea   Hyperlipidemia   Type 2 diabetes mellitus with hyperglycemia (HCC)   Polycythemia   Principal Problem: Abdominal pain, nausea and vomiting/suspecting symptomatic choledocholithiasis: - CT abdomen shows choledocholithiasis up to 1.1 cm, lab work shows minimally elevated bilirubin and alkaline phosphatase, other LFTs unremarkable. - Continue supportive care with as needed analgesics, antiemetics, IV PPI. [facility] input appreciated.  Prior history of Billroth II anatomy, unsuccessful EUS in the past - Trend daily LFTs, almost back to normal.. -No clear indication for antibiotics and hence none started. -S/p extremely difficult ERCP attempt by GI.  Gastritis biopsied.  Biliary stent placed.   Per GI, clears okay, seems reasonable to attempt cholecystectomy (will consult [facility] in a.m.) and ERCP follow-up in 6 to 10 weeks for stent removal, sphincterotomy extension and stone removal.  Monitor for postprocedure acute pancreatitis.  Active Problems:   Peripheral neuropathy Continue gabapentin 100 mg p.o. twice daily Continue gabapentin 400 mg p.o. bedtime.     Hypertension Continue carvedilol 25 mg p.o. twice daily and home dose of losartan. Labetalol 20 mg every 2 hours as needed. Monitor blood pressure and heart rate. Mildly uncontrolled at times this afternoon likely due to procedure     Gout On colchicine, continue. Currently asymptomatic.     Sleep apnea Not on CPAP.     Hyperlipidemia/Aortic atherosclerosis Continue rosuvastatin.     Type 2 diabetes mellitus with hyperglycemia (HCC) Uncontrolled.  CBG up to 304 this morning.  Missed home dose of Lantus last night. Started Semglee at reduced dose 15 units daily, NovoLog SSI.  Monitor and adjust insulins as needed. Reasonably controlled. A1c 7.9     Polycythemia Resolved after IV hydration.  Hypokalemia Replaced  Leukocytosis WBC up from 15.3-16.9.  No fevers though.  May be stress response.  No antibiotics initiated.  Back down to 15.2.  Body mass index is 23.03 kg/m.  Nutritional Status          DVT prophylaxis: SCDs Start: 11/28/21 1643     Code Status: Full Code Family Communication: None at bedside. Disposition:  Status is: Inpatient  Remains inpatient appropriate because: Severity of presentation with several episodes of nausea and vomiting, abdominal pain and concern for symptomatic choledocholithiasis, plan for ERCP tomorrow in a patient with distorted biliary anatomy.        Consultants:   [facility]  Procedures:  ERCP with biliary stent by Dr. Rush Landmark on 12/8.  Antimicrobials:      Subjective:  Seen this morning prior to procedure.  Abdominal pain 8/10 in severity, down  to 5/10 after IV pain meds.  Passing flatus.  No vomiting or nausea.  Objective:   Vitals:   11/29/21 2110 11/30/21 0450 11/30/21 1205 11/30/21 1359  BP: (!) 148/58 (!) 111/53 (!) 141/56 (!) 168/61  Pulse: 71 82 78 78  Resp: 18 18 20 10   Temp: 99 F (37.2 C) 98 F (36.7 C) 98.2 F (36.8 C) 98.5 F (36.9 C)  TempSrc: Oral Oral Oral Oral  SpO2: 96% 94% 91% 92%  Weight:      Height:        General exam: Elderly female, moderately built and nourished lying uncomfortably propped up in bed without distress.  Oral mucosa moist Respiratory system: Clear to auscultation.  No increased work of breathing. Cardiovascular system: S1 and S2 heard, RRR.  No JVD, murmurs or pedal edema.  Telemetry personally reviewed: V paced rhythm. Gastrointestinal system: Abdomen is nondistended, soft, epigastric tenderness with voluntary guarding, no rigidity or rebound.  Normal bowel sounds heard Central nervous system: Alert and oriented. No focal neurological deficits. Extremities: Symmetric 5 x 5 power. Skin: No rashes, lesions or ulcers Psychiatry: Judgement and insight appear normal. Mood & affect appropriate.     Data Reviewed:   I have personally reviewed following labs and imaging studies   CBC: Recent Labs  Lab 11/28/21 1417 11/28/21 1439 11/29/21 0458 11/30/21 0458  WBC 15.3*  --  16.9* 15.2*  NEUTROABS 13.6*  --   --   --   HGB 16.0* 17.0* 12.7 11.9*  HCT 48.3* 50.0* 39.2 36.8  MCV 92.4  --  94.2 95.8  PLT 283  --  199 161    Basic Metabolic Panel: Recent Labs  Lab 11/28/21 1417 11/28/21 1439 11/29/21 0458 11/30/21 0458  NA 133* 135 133* 135  K 4.9 4.9 3.4* 3.7  CL 94* 97* 98 99  CO2 24  --  25 29  GLUCOSE 296* 296* 279* 182*  BUN 14 16 12 13   CREATININE 0.79 0.70 0.73 0.81  CALCIUM 9.7  --  8.9 8.4*    Liver Function Tests: Recent Labs  Lab 11/28/21 1417 11/29/21 0458 11/30/21 0458  AST 33 138* 49*  ALT 14 43 27  ALKPHOS 66 96 72  BILITOT 1.5* 1.3* 0.9   PROT 8.3* 6.4* 5.7*  ALBUMIN 4.7 3.7 3.2*    CBG: Recent Labs  Lab 11/30/21 0737 11/30/21 1201 11/30/21 1447  GLUCAP 162* 154* 120*    Microbiology Studies:  No results found for this or any previous visit (from the past 240 hour(s)).  Radiology Studies:  DG C-Arm 1-60 Min-No Report  Result Date: 11/30/2021 Fluoroscopy was utilized by the requesting physician.  No radiographic interpretation.   DG C-Arm 1-60 Min-No Report  Result Date: 11/30/2021 Fluoroscopy was utilized by the requesting physician.  No radiographic interpretation.    Scheduled Meds:    [MAR Hold] carvedilol  25 mg Oral BID WC   [MAR Hold] colchicine  0.6 mg Oral Daily   [MAR Hold] DULoxetine  60 mg Oral Daily   [MAR Hold] gabapentin  100 mg Oral BID   [MAR Hold] gabapentin  400 mg Oral QHS   indomethacin  100 mg Rectal Once   [MAR Hold] insulin aspart  0-5 Units Subcutaneous QHS   [MAR Hold] insulin aspart  0-9 Units Subcutaneous  TID WC   [MAR Hold] insulin glargine-yfgn  15 Units Subcutaneous Daily   [MAR Hold] losartan  100 mg Oral Daily   [MAR Hold] melatonin  3 mg Oral QHS   [MAR Hold] multivitamin with minerals  1 tablet Oral Daily   [MAR Hold] pantoprazole (PROTONIX) IV  40 mg Intravenous Q12H   [MAR Hold] potassium chloride  40 mEq Oral Once   [MAR Hold] rosuvastatin  10 mg Oral Daily    Continuous Infusions:    sodium chloride     lactated ringers Stopped (11/30/21 1745)   lactated ringers 20 mL/hr at 11/30/21 1403     LOS: 1 day     Vernell Leep, MD,  FACP, [facility], [facility], [facility] (Care Management Physician Certified) [place]  To contact the attending provider between 7A-7P or the covering provider during after hours 7P-7A, please log into the web site [email or web address] and access using universal [place] password for that web site. If you do not have the password, please call the hospital operator.  11/30/2021, 6:17 PM

## 2021-11-30 NOTE — Anesthesia Preprocedure Evaluation (Signed)
Anesthesia Evaluation  Patient identified by MRN, date of birth, ID band Patient awake    Reviewed: Allergy & Precautions, H&P , NPO status , Patient's Chart, lab work & pertinent test results  Airway Mallampati: II  TM Distance: >3 FB Neck ROM: Full    Dental no notable dental hx.    Pulmonary sleep apnea and Continuous Positive Airway Pressure Ventilation ,    Pulmonary exam normal breath sounds clear to auscultation       Cardiovascular hypertension, Normal cardiovascular exam+ pacemaker  Rhythm:Regular Rate:Normal     Neuro/Psych negative neurological ROS  negative psych ROS   GI/Hepatic Neg liver ROS, PUD, GERD  Medicated,Gastroparesis    Endo/Other  diabetes, Insulin Dependent  Renal/GU negative Renal ROS  negative genitourinary   Musculoskeletal negative musculoskeletal ROS (+)   Abdominal   Peds negative pediatric ROS (+)  Hematology negative hematology ROS (+)   Anesthesia Other Findings   Reproductive/Obstetrics negative OB ROS                             Anesthesia Physical Anesthesia Plan  ASA: 3  Anesthesia Plan: General   Post-op Pain Management:    Induction: Intravenous  PONV Risk Score and Plan: 3 and Ondansetron, Dexamethasone and Treatment may vary due to age or medical condition  Airway Management Planned: Oral ETT  Additional Equipment:   Intra-op Plan:   Post-operative Plan: Extubation in OR  Informed Consent: I have reviewed the patients History and Physical, chart, labs and discussed the procedure including the risks, benefits and alternatives for the proposed anesthesia with the patient or authorized representative who has indicated his/her understanding and acceptance.     Dental advisory given  Plan Discussed with: CRNA and Surgeon  Anesthesia Plan Comments:         Anesthesia Quick Evaluation

## 2021-12-01 ENCOUNTER — Encounter (HOSPITAL_COMMUNITY): Payer: Self-pay | Admitting: Internal Medicine

## 2021-12-01 DIAGNOSIS — Z01810 Encounter for preprocedural cardiovascular examination: Secondary | ICD-10-CM

## 2021-12-01 DIAGNOSIS — K8050 Calculus of bile duct without cholangitis or cholecystitis without obstruction: Secondary | ICD-10-CM | POA: Diagnosis not present

## 2021-12-01 DIAGNOSIS — I10 Essential (primary) hypertension: Secondary | ICD-10-CM

## 2021-12-01 LAB — COMPREHENSIVE METABOLIC PANEL
ALT: 117 U/L — ABNORMAL HIGH (ref 0–44)
AST: 280 U/L — ABNORMAL HIGH (ref 15–41)
Albumin: 3.3 g/dL — ABNORMAL LOW (ref 3.5–5.0)
Alkaline Phosphatase: 135 U/L — ABNORMAL HIGH (ref 38–126)
Anion gap: 11 (ref 5–15)
BUN: 16 mg/dL (ref 8–23)
CO2: 25 mmol/L (ref 22–32)
Calcium: 8.2 mg/dL — ABNORMAL LOW (ref 8.9–10.3)
Chloride: 96 mmol/L — ABNORMAL LOW (ref 98–111)
Creatinine, Ser: 0.96 mg/dL (ref 0.44–1.00)
GFR, Estimated: 59 mL/min — ABNORMAL LOW (ref >60)
Glucose, Bld: 247 mg/dL — ABNORMAL HIGH (ref 70–99)
Potassium: 4.2 mmol/L (ref 3.5–5.1)
Sodium: 132 mmol/L — ABNORMAL LOW (ref 135–145)
Total Bilirubin: 0.8 mg/dL (ref 0.3–1.2)
Total Protein: 6.2 g/dL — ABNORMAL LOW (ref 6.5–8.1)

## 2021-12-01 LAB — CBC
HCT: 38.4 % (ref 36.0–46.0)
Hemoglobin: 12.5 g/dL (ref 12.0–15.0)
MCH: 30.7 pg (ref 26.0–34.0)
MCHC: 32.6 g/dL (ref 30.0–36.0)
MCV: 94.3 fL (ref 80.0–100.0)
Platelets: 178 10*3/uL (ref 150–400)
RBC: 4.07 MIL/uL (ref 3.87–5.11)
RDW: 13.5 % (ref 11.5–15.5)
WBC: 14.5 10*3/uL — ABNORMAL HIGH (ref 4.0–10.5)
nRBC: 0 % (ref 0.0–0.2)

## 2021-12-01 LAB — GLUCOSE, CAPILLARY
Glucose-Capillary: 247 mg/dL — ABNORMAL HIGH (ref 70–99)
Glucose-Capillary: 238 mg/dL — ABNORMAL HIGH (ref 70–99)
Glucose-Capillary: 264 mg/dL — ABNORMAL HIGH (ref 70–99)
Glucose-Capillary: 175 mg/dL — ABNORMAL HIGH (ref 70–99)

## 2021-12-01 NOTE — Care Management Important Message (Signed)
Important Message  Patient Details IM Letter given to the Patient. Name: Judith [name] MRN: [id number] Date of Birth: [birth year]   Medicare Important Message Given:  Yes     Kerin Salen 12/01/2021, 1:17 PM

## 2021-12-01 NOTE — Transfer of Care (Signed)
Immediate Anesthesia Transfer of Care Note  Patient: Judith [name]  Procedure(s) Performed: ENDOSCOPIC RETROGRADE CHOLANGIOPANCREATOGRAPHY (ERCP) WITH PROPOFOL ESOPHAGOGASTRODUODENOSCOPY (EGD) WITH PROPOFOL BIOPSY SPHINCTEROTOMY BILIARY STENT PLACEMENT SUBMUCOSAL TATTOO INJECTION  Patient Location: PACU  Anesthesia Type:General  Level of Consciousness: awake  Airway & Oxygen Therapy: Patient Spontanous Breathing and Patient connected to face mask oxygen  Post-op Assessment: Report given to RN and Post -op Vital signs reviewed and stable  Post vital signs: Reviewed and stable  Last Vitals:  Vitals Value Taken Time    Last Pain:  Vitals:   12/01/21 0848  TempSrc:   PainSc: 7       Patients Stated Pain Goal: 3 (06/23/15 0109)  Complications: No notable events documented.

## 2021-12-01 NOTE — Progress Notes (Addendum)
PROGRESS NOTE   Judith [name]  MEQ:[id number]    DOB: [birth year]    DOA: 11/28/2021  PCP: Janie Morning, DO   I have briefly reviewed patients previous medical records in [facility].  Chief Complaint  Patient presents with   Abdominal Pain   Emesis    Brief Narrative:   80 y.o. female with medical history significant of normocytic anemia, osteoarthritis, biliary gastritis, cervical spine DDD, controlled type II DM with neuropathy, depression, gout, hyperlipidemia, hypertension, osteoporosis, seasonal allergies, sleep apnea not on CPAP, history of UTI, history of partial gastrectomy, biliary gastritis, GERD, gastroparesis, duodenal and gastric ulcers, history of gallstones, dilated bile duct who is coming to the emergency department due to multiple episodes of emesis, nausea and abdominal pain on day of admission.  CT abdomen and pelvis with contrast showed cholelithiasis and choledocholithiasis up to 1.1 cm.  Admitted for suspected symptomatic choledocholithiasis/cholelithiasis, [facility] consulted and s/p extremely difficult ERCP attempt 12/8.  General surgery consulted for possible cholecystectomy.   Assessment & Plan:  Principal Problem:   Choledocholithiasis Active Problems:   Peripheral neuropathy   Hypertension   Gout   Sleep apnea   Hyperlipidemia   Type 2 diabetes mellitus with hyperglycemia (HCC)   Polycythemia   Principal Problem: Symptomatic choledocholithiasis: - CT abdomen shows choledocholithiasis up to 1.1 cm, lab work shows minimally elevated bilirubin and alkaline phosphatase, other LFTs unremarkable. - Continue supportive care with as needed analgesics, antiemetics, IV PPI. [facility] input appreciated.  Prior history of Billroth II anatomy, unsuccessful EUS in the past - Trend daily LFTs, increase in AST and ALT as below, post ERCP. -No clear indication for antibiotics and hence none started. -S/p extremely difficult ERCP attempt by GI.  Gastritis  biopsied.  Biliary stent placed.   - Per GI, continue clear liquid diet and advance as tolerated (left on clears in case she goes for cholecystectomy), they have consulted general surgery for cholecystectomy, continue twice daily PPI, no chemical VTE prophylaxis for another 24 hours, monitor for pancreatitis and trend LFTs. - ERCP follow-up in 6 to 10 weeks for stent removal, sphincterotomy extension and stone removal.  -General surgery consultation appreciated.  Feel that she may benefit from cholecystectomy during this hospitalization.  Cardiology has provided preop clearance.  Timing of procedure yet to be finalized.  Active Problems:   Peripheral neuropathy Continue gabapentin 100 mg p.o. twice daily Continue gabapentin 400 mg p.o. bedtime.     Hypertension Continue carvedilol 25 mg p.o. twice daily and home dose of losartan. Labetalol 20 mg every 2 hours as needed. Monitor blood pressure and heart rate. Mildly uncontrolled at times, continue current regimen without change.     Gout On colchicine, continue. Currently asymptomatic.     Sleep apnea Not on CPAP.     Hyperlipidemia/Aortic atherosclerosis Continue rosuvastatin.     Type 2 diabetes mellitus with hyperglycemia ([place]) Started Semglee at reduced dose 15 units daily, NovoLog SSI.  Monitor and adjust insulins as needed. Mildly uncontrolled in the mid 200s. A1c 7.9     Polycythemia Resolved after IV hydration.  Hypokalemia Replaced  Leukocytosis WBC up from 15.3-16.9.  No fevers though.  May be stress response.  No antibiotics initiated.  Back down to 14.5.  Complete heart block s/p pacemaker placement At the general surgery's request, cardiology provided preop clearance.  Paced rhythm on telemetry.  Body mass index is 23.03 kg/m.  Nutritional Status          DVT prophylaxis:  SCDs Start: 11/28/21 1643     Code Status: Full Code Family Communication: daughter at bedside. Disposition:  Status is:  Inpatient  Remains inpatient appropriate because: Monitoring post ERCP for pancreatitis, trending LFTs, possible cholecystectomy.       Consultants:   [facility] General surgery  Procedures:   ERCP with biliary stent by Dr. Rush Landmark on 12/8.  Antimicrobials:      Subjective:  Feels worn out postprocedure yesterday.  Abdominal pain better but still on pain meds.  No nausea or vomiting.  No BM.  Objective:   Vitals:   12/01/21 0226 12/01/21 0529 12/01/21 0829 12/01/21 1138  BP: (!) 182/66 (!) 148/81 (!) 166/74 (!) 154/63  Pulse: 65 73 74 73  Resp: 18 20    Temp: 98.3 F (36.8 C) 98 F (36.7 C) 98.6 F (37 C) 99.1 F (37.3 C)  TempSrc: Oral Oral Oral Oral  SpO2: 98% 100% 95% 92%  Weight:      Height:        General exam: Elderly female, moderately built and nourished lying uncomfortably propped up in bed without distress.  Oral mucosa moist Respiratory system: Clear to auscultation.  No increased work of breathing. Cardiovascular system: S1 and S2 heard, RRR.  No JVD, murmurs or pedal edema.  Telemetry personally reviewed: V paced rhythm. Gastrointestinal system: Abdomen is nondistended and soft.  Mild tenderness (less compared to yesterday), in the epigastric region without rigidity, guarding or rebound.  Normal bowel sounds heard Central nervous system: Alert and oriented. No focal neurological deficits. Extremities: Symmetric 5 x 5 power. Skin: No rashes, lesions or ulcers Psychiatry: Judgement and insight appear normal. Mood & affect appropriate.     Data Reviewed:   I have personally reviewed following labs and imaging studies   CBC: Recent Labs  Lab 11/28/21 1417 11/28/21 1439 11/29/21 0458 11/30/21 0458 12/01/21 0539  WBC 15.3*  --  16.9* 15.2* 14.5*  NEUTROABS 13.6*  --   --   --   --   HGB 16.0*   < > 12.7 11.9* 12.5  HCT 48.3*   < > 39.2 36.8 38.4  MCV 92.4  --  94.2 95.8 94.3  PLT 283  --  199 192 178   < > = values in this interval not  displayed.    Basic Metabolic Panel: Recent Labs  Lab 11/28/21 1417 11/28/21 1439 11/29/21 0458 11/30/21 0458 12/01/21 0539  NA 133* 135 133* 135 132*  K 4.9 4.9 3.4* 3.7 4.2  CL 94* 97* 98 99 96*  CO2 24  --  25 29 25   GLUCOSE 296* 296* 279* 182* 247*  BUN 14 16 12 13 16   CREATININE 0.79 0.70 0.73 0.81 0.96  CALCIUM 9.7  --  8.9 8.4* 8.2*    Liver Function Tests: Recent Labs  Lab 11/28/21 1417 11/29/21 0458 11/30/21 0458 12/01/21 0539  AST 33 138* 49* 280*  ALT 14 43 27 117*  ALKPHOS 66 96 72 135*  BILITOT 1.5* 1.3* 0.9 0.8  PROT 8.3* 6.4* 5.7* 6.2*  ALBUMIN 4.7 3.7 3.2* 3.3*    CBG: Recent Labs  Lab 11/30/21 2340 12/01/21 0818 12/01/21 1135  GLUCAP 265* 247* 238*    Microbiology Studies:  No results found for this or any previous visit (from the past 240 hour(s)).  Radiology Studies:  DG ERCP WITH SPHINCTEROTOMY  Result Date: 12/01/2021 CLINICAL DATA:  80 year old female with history of choledocholithiasis. EXAM: ERCP TECHNIQUE: Multiple spot images obtained with the fluoroscopic  device and submitted for interpretation post-procedure. FLUOROSCOPY TIME:  Fluoroscopy Time:  6 minutes, 56 seconds Radiation Exposure Index (if provided by the fluoroscopic device): Provided. Number of Acquired Spot Images: 63 COMPARISON:  CT abdomen pelvis from 11/28/2021 FINDINGS: Fluoroscopic images demonstrate positioning of the endoscope near the second portion the duodenum with retrograde wire cannulation of the common bile duct. Cholangiogram was performed which demonstrated filling defects in the distal common bile duct compatible with known choledocholithiasis. No significant intrahepatic biliary ductal dilation is noted. Mild extrahepatic biliary ductal dilation. Upon completion, there was placement of a plastic common bile duct stent with decompression of the biliary tree. IMPRESSION: Choledocholithiasis with mild extrahepatic biliary ductal dilation. Placement of a common  bile duct stent. These images were submitted for radiologic interpretation only. Please see the procedural report for full procedural details and the amount of contrast and the fluoroscopy time utilized. Ruthann Cancer, MD [facility] Radiology Electronically Signed   By: Ruthann Cancer M.D.   On: 12/01/2021 08:03   DG C-Arm 1-60 Min-No Report  Result Date: 11/30/2021 Fluoroscopy was utilized by the requesting physician.  No radiographic interpretation.   DG C-Arm 1-60 Min-No Report  Result Date: 11/30/2021 Fluoroscopy was utilized by the requesting physician.  No radiographic interpretation.    Scheduled Meds:    carvedilol  25 mg Oral BID WC   colchicine  0.6 mg Oral Daily   DULoxetine  60 mg Oral Daily   gabapentin  100 mg Oral BID   gabapentin  400 mg Oral QHS   indomethacin  100 mg Rectal Once   insulin aspart  0-5 Units Subcutaneous QHS   insulin aspart  0-9 Units Subcutaneous TID WC   insulin glargine-yfgn  15 Units Subcutaneous Daily   losartan  100 mg Oral Daily   melatonin  3 mg Oral QHS   multivitamin with minerals  1 tablet Oral Daily   pantoprazole (PROTONIX) IV  40 mg Intravenous Q12H   potassium chloride  40 mEq Oral Once   rosuvastatin  10 mg Oral Daily    Continuous Infusions:    lactated ringers 100 mL/hr at 11/30/21 1902     LOS: 2 days     Vernell Leep, MD,  FACP, [facility], [facility], [facility] (Care Management Physician Certified) [place]  To contact the attending provider between 7A-7P or the covering provider during after hours 7P-7A, please log into the web site [email or web address] and access using universal [place] password for that web site. If you do not have the password, please call the hospital operator.  12/01/2021, 12:20 PM

## 2021-12-01 NOTE — Progress Notes (Addendum)
Progress Note   Subjective  Chief Complaint: Choledocholithiasis  Patient status post difficult ERCP with Dr. Rush Landmark due to known Billroth II anatomy. After multiple attempts Dr. Rush Landmark was able to access the bile duct and placed 5 cm stent  Patient's daughter [name] was in the room. Patient denies fever, chills. Patient's had clear liquids last night and this morning, denies nausea and vomiting. States she has no appetite at this time. Patient states her abdominal pain is unchanged from previous, no epigastric discomfort. Patient's not had a bowel movement since Tuesday, states this can be normal for her.   Objective   Vital signs in last 24 hours: Temp:  [98 F (36.7 C)-98.6 F (37 C)] 98.6 F (37 C) (12/09 0829) Pulse Rate:  [64-89] 74 (12/09 0829) Resp:  [10-20] 20 (12/09 0529) BP: (141-214)/(56-83) 166/74 (12/09 0829) SpO2:  [91 %-100 %] 95 % (12/09 0829) Last BM Date: 11/27/21 General:    white female in NAD Heart:  Regular rate and rhythm; no murmurs Lungs: Respirations even and unlabored, lungs CTA bilaterally Abdomen:  Soft, slight left upper quadrant tenderness and nondistended. Normal bowel sounds. Extremities:  Without edema. Neurologic:  Alert and oriented,  grossly normal neurologically. Psych:  Cooperative. Normal mood and affect.  Intake/Output from previous day: 12/08 0701 - 12/09 0700 In: 2200 [I.V.:2200] Out: 1700 [Urine:1700] Intake/Output this shift: No intake/output data recorded.  Lab Results: Recent Labs    11/29/21 0458 11/30/21 0458 12/01/21 0539  WBC 16.9* 15.2* 14.5*  HGB 12.7 11.9* 12.5  HCT 39.2 36.8 38.4  PLT 199 192 178   BMET Recent Labs    11/29/21 0458 11/30/21 0458 12/01/21 0539  NA 133* 135 132*  K 3.4* 3.7 4.2  CL 98 99 96*  CO2 25 29 25   GLUCOSE 279* 182* 247*  BUN 12 13 16   CREATININE 0.73 0.81 0.96  CALCIUM 8.9 8.4* 8.2*   LFT Recent Labs    12/01/21 0539  PROT 6.2*  ALBUMIN 3.3*  AST 280*   ALT 117*  ALKPHOS 135*  BILITOT 0.8   PT/INR No results for input(s): LABPROT, INR in the last 72 hours.  Studies/Results: DG ERCP WITH SPHINCTEROTOMY  Result Date: 12/01/2021 CLINICAL DATA:  80 year old female with history of choledocholithiasis. EXAM: ERCP TECHNIQUE: Multiple spot images obtained with the fluoroscopic device and submitted for interpretation post-procedure. FLUOROSCOPY TIME:  Fluoroscopy Time:  6 minutes, 56 seconds Radiation Exposure Index (if provided by the fluoroscopic device): Provided. Number of Acquired Spot Images: 63 COMPARISON:  CT abdomen pelvis from 11/28/2021 FINDINGS: Fluoroscopic images demonstrate positioning of the endoscope near the second portion the duodenum with retrograde wire cannulation of the common bile duct. Cholangiogram was performed which demonstrated filling defects in the distal common bile duct compatible with known choledocholithiasis. No significant intrahepatic biliary ductal dilation is noted. Mild extrahepatic biliary ductal dilation. Upon completion, there was placement of a plastic common bile duct stent with decompression of the biliary tree. IMPRESSION: Choledocholithiasis with mild extrahepatic biliary ductal dilation. Placement of a common bile duct stent. These images were submitted for radiologic interpretation only. Please see the procedural report for full procedural details and the amount of contrast and the fluoroscopy time utilized. Ruthann Cancer, MD [facility] Radiology Electronically Signed   By: Ruthann Cancer M.D.   On: 12/01/2021 08:03   DG C-Arm 1-60 Min-No Report  Result Date: 11/30/2021 Fluoroscopy was utilized by the requesting physician.  No radiographic interpretation.  DG C-Arm 1-60 Min-No Report  Result Date: 11/30/2021 Fluoroscopy was utilized by the requesting physician.  No radiographic interpretation.       Assessment / Plan:    Impression: 1.   Choledocholithiasis/cholelithiasis:  patient with known Billroth II anatomy, unsuccessful EUS in the past s/p ERCP 11/30/21 with Dr. Rush Landmark.  could also be element of gastroparesis+/- gastritis/PUD White blood cell count trending down AST 280 (49) ALT 117 (27) Alkphos 135  TBili 0.8 (0.9) patient had slight increase in AST, ALT, bilirubin normal. Expected after ERCP yesterday which was technically difficult due to patient's anatomy 2.  Type 2 diabetes 3.  Peripheral neuropathy 4.  Hypertension   Plan: - Continue to monitor LFTs -Monitor for pancreatitis, but no epigastric pain, nausea, vomiting at this time. -Continue clear liquid diet, advance as tolerated. -Continue Pantoprazole 40 mg twice daily given patient's extensive history and epigastric pain. -No VTE prophylaxis for another 24 hours -consult for general surgery for possible cholecystectomy -We will need follow-up ERCP 6 to 10 weeks for stent removal, stone removal and sphincterectomy extension    LOS: 2 days   Vladimir Crofts  12/01/2021, 8:40 AM     [facility] Attending   I have taken an interval history, reviewed the chart and examined the patient. I agree with the Advanced Practitioner's note, impression and recommendations.  Majority the medical decision-making in the formulation of the assessment and plan were performed by me.   She is a bit worn out but overall sxs improved.  LFT rise not surprising s/p ERCP and bile duct intervention.  Gatha Mayer, MD, [facility] 12/01/2021 3:03 PM

## 2021-12-01 NOTE — TOC Initial Note (Signed)
Transition of Care [facility]) - Initial/Assessment Note    Patient Details  Name: Judith [name] MRN: [id number] Date of Birth: 11-08-1941  [facility] CM/SW Contact:    Judith Phi, RN Phone Number: 12/01/2021, 2:16 PM  Clinical Narrative: Spoke to dtr Judith [name]-d/c plan to return back to [facility]. Await PT recc prior fl2. Family to transport back to [place] @ d/c.                  Expected Discharge Plan: Assisted Living Barriers to Discharge: Continued Medical Work up   Patient Goals and CMS Choice Patient states their goals for this hospitalization and ongoing recovery are:: from [facility] [address] CMS Medicare.gov Compare Post Acute Care list provided to:: Patient Choice offered to / list presented to : Patient  Expected Discharge Plan and Services Expected Discharge Plan: Assisted Living   Discharge Planning Services: CM Consult                                          Prior Living Arrangements/Services   Lives with:: Self Patient language and need for interpreter reviewed:: Yes Do you feel safe going back to the place where you live?: Yes            Criminal Activity/Legal Involvement Pertinent to Current Situation/Hospitalization: No - Comment as needed  Activities of Daily Living Home Assistive Devices/Equipment: Eyeglasses (reading glasses) ADL Screening (condition at time of admission) Patient's cognitive ability adequate to safely complete daily activities?: Yes Is the patient deaf or have difficulty hearing?: No Does the patient have difficulty seeing, even when wearing glasses/contacts?: No Does the patient have difficulty concentrating, remembering, or making decisions?: No Patient able to express need for assistance with ADLs?: Yes Does the patient have difficulty dressing or bathing?: No Independently performs ADLs?: Yes (appropriate for developmental age) Does the patient have difficulty walking or climbing  stairs?: Yes Weakness of Legs: Both Weakness of Arms/Hands: Both  Permission Sought/Granted                  Emotional Assessment Appearance:: Appears stated age Attitude/Demeanor/Rapport: Engaged Affect (typically observed): Calm Orientation: : Oriented to Self, Oriented to Place, Oriented to  Time, Oriented to Situation Alcohol / Substance Use: Not Applicable Psych Involvement: No (comment)  Admission diagnosis:  Choledocholithiasis [K80.50] Patient Active Problem List   Diagnosis Date Noted   Choledocholithiasis 11/28/2021   Sleep apnea    Hyperlipidemia    Type 2 diabetes mellitus with hyperglycemia ([place])    Polycythemia    Unspecified inflammatory spondylopathy, cervical region ([place]) 11/15/2020   Multilevel degenerative disc disease 04/03/2018   Acute blood loss anemia 01/17/2018   Gallstones 01/17/2018   Weight loss, unintentional 01/17/2018   Dilated bile duct    Elevated liver function tests    Gastritis and gastroduodenitis    S/P partial gastrectomy    Peripheral neuropathy    Hypertension    Gastroparesis    Duodenal ulcer disease    Controlled type 2 diabetes mellitus with diabetic neuropathy, with long-term current use of insulin (HCC)    Depression    Biliary gastritis    Seasonal allergies    Presence of cardiac pacemaker    Gout    Cervical spine arthritis 04/05/2016   Long-term insulin use in type 2 diabetes ([place]) 04/05/2016   PCP:  Judith Morning, DO  Pharmacy:   [facility] [address] [place] [address] Phone: [phone] Fax: [phone]     Social Determinants of Health (SDOH) Interventions    Readmission Risk Interventions No flowsheet data found.

## 2021-12-01 NOTE — Consult Note (Signed)
Cardiology Consultation:   Patient ID: Judith [name] MRN: [id number]; DOB: [birth year]  Admit date: 11/28/2021 Date of Consult: 12/01/2021  PCP:  Janie Morning, DO   [facility] Providers Cardiologist:  Sanda Klein, MD   {  Patient Profile:   Judith [name] is a 80 y.o. female with a hx of complete heart block s/p permanent pacemaker implantation with GEN change out in 2011, diabetes mellitus with peripheral neuropathy, biliary gastritis with history of partial gastrectomy, depression, gout, OSA not on CPAP, and hypertension who is being seen 12/01/2021 for the evaluation of Pre-op clearance at the request of Dr. Algis Liming.  Patient had a permanent pacemaker implantation in 2007 in [place].  No prior history of coronary artery disease.  Remote stress test in the past was normal.  1 time her device detected 6-second episode of possible atrial fibrillation.  Last seen by Dr. Sallyanne Kuster July 2022.  Patient is pacemaker dependent on note.  History of Present Illness:   Judith [name] presented to ER 12/6 with abdominal pain, nausea and vomiting.  CT of abdomen showed cholelithiasis and choledocholithiasis up to 1.1 cm.  She was seen by GI and underwent ERCP which was difficult due to known Billroth II anatomy.  Underwent stent placement to common bile duct.  Given ongoing abdominal pain General surgery is consulted for consideration of cholecystectomy and currently tentatively planned for tomorrow.  LFTs are trending up Renal function normal Potassium 4.2 Hemoglobin 12.5 No EKG done this admission  Patient lives at [facility].  She walks about a quarter of a mile 3 times per week.  No exertional chest pain or shortness of breath.  She denies palpitation, orthopnea, PND, syncope, lower extremity edema or melena.  Past Medical History:  Diagnosis Date   Anemia    Arthritis    Biliary gastritis    Blood transfusion without reported diagnosis    2007   Cervical spine  arthritis 04/05/2016   Controlled type 2 diabetes mellitus with diabetic neuropathy, with long-term current use of insulin ([place])    Depression    Diabetes mellitus type 2 with complications ([place])    Dilated bile duct    Duodenal ulcer disease    Encounter for long-term (current) use of insulin ([place])    Gallstones 01/17/2018   Gastric ulcer    Gastritis and gastroduodenitis    Gastroparesis    severe   GERD (gastroesophageal reflux disease)    Gout    tested for gout but was not   Heart murmur    Hyperlipidemia    in past not now   Hypertension    Hypokalemia, gastrointestinal losses    secondary to severe gastroparesis   Long-term insulin use in type 2 diabetes ([place]) 04/05/2016   Osteoporosis    just had bone density test possible    Peripheral neuropathy    Presence of cardiac pacemaker    S/P partial gastrectomy    due to severe gastric and gudoenal ulcers   Seasonal allergies    Sleep apnea    could not use CPAP   UTI (urinary tract infection)     Past Surgical History:  Procedure Laterality Date   BILIARY STENT PLACEMENT N/A 11/30/2021   Procedure: BILIARY STENT PLACEMENT;  Surgeon: Irving Copas., MD;  Location: [facility];  Service: Gastroenterology;  Laterality: N/A;   BIOPSY  11/30/2021   Procedure: BIOPSY;  Surgeon: Irving Copas., MD;  Location: [facility];  Service: Gastroenterology;;   COLONOSCOPY  02/11/2018  no polyps, + small internal hemorrhoids   ENDOSCOPIC RETROGRADE CHOLANGIOPANCREATOGRAPHY (ERCP) WITH PROPOFOL N/A 11/30/2021   Procedure: ENDOSCOPIC RETROGRADE CHOLANGIOPANCREATOGRAPHY (ERCP) WITH PROPOFOL;  Surgeon: Rush Landmark Telford Nab., MD;  Location: [facility];  Service: Gastroenterology;  Laterality: N/A;   ESOPHAGOGASTRODUODENOSCOPY (EGD) WITH PROPOFOL N/A 11/30/2021   Procedure: ESOPHAGOGASTRODUODENOSCOPY (EGD) WITH PROPOFOL;  Surgeon: Rush Landmark Telford Nab., MD;  Location: [facility];  Service: Gastroenterology;   Laterality: N/A;   EUS N/A 12/12/2017   Procedure: UPPER ENDOSCOPIC ULTRASOUND (EUS) RADIAL;  Surgeon: [name], MD;  Location: [facility];  Service: Endoscopy;  Laterality: N/A;   GASTRECTOMY     JOINT REPLACEMENT     pace maker     PARTIAL GASTRECTOMY     SPHINCTEROTOMY  11/30/2021   Procedure: SPHINCTEROTOMY;  Surgeon: Mansouraty, Telford Nab., MD;  Location: [facility];  Service: Gastroenterology;;   SUBMUCOSAL TATTOO INJECTION  11/30/2021   Procedure: SUBMUCOSAL TATTOO INJECTION;  Surgeon: Irving Copas., MD;  Location: [facility];  Service: Gastroenterology;;   TONSILLECTOMY     45 years ago   UPPER GASTROINTESTINAL ENDOSCOPY       Inpatient Medications: Scheduled Meds:  carvedilol  25 mg Oral BID WC   colchicine  0.6 mg Oral Daily   DULoxetine  60 mg Oral Daily   gabapentin  100 mg Oral BID   gabapentin  400 mg Oral QHS   indomethacin  100 mg Rectal Once   insulin aspart  0-5 Units Subcutaneous QHS   insulin aspart  0-9 Units Subcutaneous TID WC   insulin glargine-yfgn  15 Units Subcutaneous Daily   losartan  100 mg Oral Daily   multivitamin with minerals  1 tablet Oral Daily   pantoprazole (PROTONIX) IV  40 mg Intravenous Q12H   potassium chloride  40 mEq Oral Once   rosuvastatin  10 mg Oral Daily   Continuous Infusions:  lactated ringers 100 mL/hr at 11/30/21 1902   PRN Meds: HYDROmorphone (DILAUDID) injection, ondansetron **OR** ondansetron (ZOFRAN) IV, ondansetron (ZOFRAN) IV, oxyCODONE  Allergies:    Allergies  Allergen Reactions   Asa [Aspirin] Other (See Comments)    Causes ulcers    Ibuprofen     Per mar   Tylenol [Acetaminophen]     Intolerance due to liver damage   Sulfa Antibiotics Rash    Social History:   Social History   Socioeconomic History   Marital status: Widowed    Spouse name: Not on file   Number of children: 3   Years of education: Not on file   Highest education level: High school graduate  Occupational  History   Not on file  Tobacco Use   Smoking status: Never   Smokeless tobacco: Never  Vaping Use   Vaping Use: Never used  Substance and Sexual Activity   Alcohol use: No    Alcohol/week: 0.0 standard drinks   Drug use: No   Sexual activity: Not Currently  Other Topics Concern   Not on file  Social History Narrative   Used to live independently in [place]   However had significant issues with severe gastroparesis resulting in severe hypokalemia and general weakness and fall resulting in hospitalization and SNF rehab   Now (10/2017) living with her daughter in [place] until able to stabilize and regain independence.      May 2019   Patient living in assisted living, [place]   Social Determinants of Health   Financial Resource Strain: Not on file  Food Insecurity: Not on file  Transportation Needs: Not on file  Physical Activity: Not on file  Stress: Not on file  Social Connections: Not on file  Intimate Partner Violence: Not on file    Family History:    Family History  Problem Relation Age of Onset   Diabetes Mother    Diabetes Father    Heart disease Father    Diabetes Sister    Heart disease Son    Colon cancer Neg Hx    Colon polyps Neg Hx    Rectal cancer Neg Hx    Stomach cancer Neg Hx    Esophageal cancer Neg Hx      ROS:  Please see the history of present illness.   All other ROS reviewed and negative.     Physical Exam/Data:   Vitals:   12/01/21 0226 12/01/21 0529 12/01/21 0829 12/01/21 1138  BP: (!) 182/66 (!) 148/81 (!) 166/74 (!) 154/63  Pulse: 65 73 74 73  Resp: 18 20    Temp: 98.3 F (36.8 C) 98 F (36.7 C) 98.6 F (37 C) 99.1 F (37.3 C)  TempSrc: Oral Oral Oral Oral  SpO2: 98% 100% 95% 92%  Weight:      Height:        Intake/Output Summary (Last 24 hours) at 12/01/2021 1428 Last data filed at 12/01/2021 0601 Gross per 24 hour  Intake 2200 ml  Output 1700 ml  Net 500 ml   Last 3 Weights 11/28/2021 07/10/2021 06/06/2021  Weight  (lbs) 130 lb 128 lb 6.4 oz 135 lb  Weight (kg) 58.968 kg 58.242 kg 61.236 kg     Body mass index is 23.03 kg/m.  General:  Well nourished, well developed, in no acute distress HEENT: normal Neck: no JVD Vascular: No carotid bruits; Distal pulses 2+ bilaterally Cardiac:  normal S1, S2; RRR; no murmur  Lungs:  clear to auscultation bilaterally, no wheezing, rhonchi or rales  Abd: soft, + tender, no hepatomegaly  Ext: no edema Musculoskeletal:  No deformities, BUE and BLE strength normal and equal Skin: warm and dry  Neuro:  CNs 2-12 intact, no focal abnormalities noted Psych:  Normal affect   EKG:  The EKG was personally reviewed and demonstrates:  AV paced rhythm  Telemetry:  Telemetry was personally reviewed and demonstrates:  Paced rhythm   Relevant CV Studies: As above   Laboratory Data:   Chemistry Recent Labs  Lab 11/29/21 0458 11/30/21 0458 12/01/21 0539  NA 133* 135 132*  K 3.4* 3.7 4.2  CL 98 99 96*  CO2 25 29 25   GLUCOSE 279* 182* 247*  BUN 12 13 16   CREATININE 0.73 0.81 0.96  CALCIUM 8.9 8.4* 8.2*  GFRNONAA >60 >60 59*  ANIONGAP 10 7 11     Recent Labs  Lab 11/29/21 0458 11/30/21 0458 12/01/21 0539  PROT 6.4* 5.7* 6.2*  ALBUMIN 3.7 3.2* 3.3*  AST 138* 49* 280*  ALT 43 27 117*  ALKPHOS 96 72 135*  BILITOT 1.3* 0.9 0.8   Hematology Recent Labs  Lab 11/29/21 0458 11/30/21 0458 12/01/21 0539  WBC 16.9* 15.2* 14.5*  RBC 4.16 3.84* 4.07  HGB 12.7 11.9* 12.5  HCT 39.2 36.8 38.4  MCV 94.2 95.8 94.3  MCH 30.5 31.0 30.7  MCHC 32.4 32.3 32.6  RDW 14.0 14.3 13.5  PLT 199 192 178   Radiology/Studies:  CT ABDOMEN PELVIS W CONTRAST  Result Date: 11/28/2021 CLINICAL DATA:  Nonlocalized acute abdominal pain. EXAM: CT ABDOMEN AND PELVIS WITH CONTRAST TECHNIQUE: Multidetector CT imaging  of the abdomen and pelvis was performed using the standard protocol following bolus administration of intravenous contrast. CONTRAST:  71mL OMNIPAQUE IOHEXOL 350 MG/ML  SOLN COMPARISON:  None. FINDINGS: Lower chest: Partially visualized cardiac leads. Query mitral annular calcifications. No acute abnormality. Tiny hiatal hernia. Hepatobiliary: No focal liver abnormality. Calcified gallstone noted within the gallbladder lumen. No gallbladder wall thickening or pericholecystic fluid. The main pancreatic duct is enlarged in caliber measuring up to 1.3 cm with several gallstone noted within its lumen (largest measuring up to 1.1 cm). Pancreas: No focal lesion. Normal pancreatic contour. No surrounding inflammatory changes. No main pancreatic ductal dilatation. Spleen: Normal in size without focal abnormality. Adrenals/Urinary Tract: No adrenal nodule bilaterally. Bilateral kidneys enhance symmetrically. Bilateral renal cortical scarring. Fluid density lesion within left kidney likely represents a simple renal cyst and measures up to 1.8 cm. Subcentimeter hypodensities are too small to characterize. No hydronephrosis. No hydroureter. The urinary bladder is unremarkable. On delayed imaging, there is no urothelial wall thickening and there are no filling defects in the opacified portions of the bilateral collecting systems or ureters. Stomach/Bowel: Partial gastrectomy surgical changes noted. Stomach is within normal limits. No evidence of bowel wall thickening or dilatation. Appendix appears normal. Vascular/Lymphatic: No abdominal aorta or iliac aneurysm. Severe atherosclerotic plaque of the aorta and its branches. No abdominal, pelvic, or inguinal lymphadenopathy. Reproductive: Uterus and bilateral adnexa are unremarkable. Other: No intraperitoneal free fluid. No intraperitoneal free gas. No organized fluid collection. Musculoskeletal: No abdominal wall hernia or abnormality. No suspicious lytic or blastic osseous lesions. No acute displaced fracture. Levoscoliosis of the thoracolumbar spine. Multilevel degenerative changes of the spine. IMPRESSION: 1. Cholelithiasis and  choledocholithiasis (up to 1.1cm). 2.  Aortic Atherosclerosis (ICD10-I70.0) - severe. Electronically Signed   By: Iven Finn M.D.   On: 11/28/2021 15:51   DG ERCP WITH SPHINCTEROTOMY  Result Date: 12/01/2021 CLINICAL DATA:  80 year old female with history of choledocholithiasis. EXAM: ERCP TECHNIQUE: Multiple spot images obtained with the fluoroscopic device and submitted for interpretation post-procedure. FLUOROSCOPY TIME:  Fluoroscopy Time:  6 minutes, 56 seconds Radiation Exposure Index (if provided by the fluoroscopic device): Provided. Number of Acquired Spot Images: 63 COMPARISON:  CT abdomen pelvis from 11/28/2021 FINDINGS: Fluoroscopic images demonstrate positioning of the endoscope near the second portion the duodenum with retrograde wire cannulation of the common bile duct. Cholangiogram was performed which demonstrated filling defects in the distal common bile duct compatible with known choledocholithiasis. No significant intrahepatic biliary ductal dilation is noted. Mild extrahepatic biliary ductal dilation. Upon completion, there was placement of a plastic common bile duct stent with decompression of the biliary tree. IMPRESSION: Choledocholithiasis with mild extrahepatic biliary ductal dilation. Placement of a common bile duct stent. These images were submitted for radiologic interpretation only. Please see the procedural report for full procedural details and the amount of contrast and the fluoroscopy time utilized. Ruthann Cancer, MD [facility] Radiology Electronically Signed   By: Ruthann Cancer M.D.   On: 12/01/2021 08:03   DG C-Arm 1-60 Min-No Report  Result Date: 11/30/2021 Fluoroscopy was utilized by the requesting physician.  No radiographic interpretation.   DG C-Arm 1-60 Min-No Report  Result Date: 11/30/2021 Fluoroscopy was utilized by the requesting physician.  No radiographic interpretation.     Assessment and Plan:    Complete heart block Patient is pacemaker dependent.  2.  Surgical clearance Patient lives at assisted living facility and fairly active.  She has no exertional chest pain or shortness  of breath.  No symptoms concerning for angina or heart failure.  RCRI 0.9%.  She is getting at least 4 METS of activity without any problem.  Patient will be cleared at acceptable risk. No cardiac work up needed.   3. HTN - BP up, likely due to pain - Continue Coreg and Losartan  - Per primary team    Risk Assessment/Risk Scores:              For questions or updates, please contact [place] Please consult [email or web address] for contact info under    Jarrett Soho, PA  12/01/2021 2:28 PM
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
## 2021-12-02 DIAGNOSIS — Z950 Presence of cardiac pacemaker: Secondary | ICD-10-CM

## 2021-12-02 LAB — COMPREHENSIVE METABOLIC PANEL
ALT: 104 U/L — ABNORMAL HIGH (ref 0–44)
AST: 323 U/L — ABNORMAL HIGH (ref 15–41)
Albumin: 3 g/dL — ABNORMAL LOW (ref 3.5–5.0)
Alkaline Phosphatase: 188 U/L — ABNORMAL HIGH (ref 38–126)
Anion gap: 8 (ref 5–15)
BUN: 15 mg/dL (ref 8–23)
CO2: 28 mmol/L (ref 22–32)
Calcium: 8 mg/dL — ABNORMAL LOW (ref 8.9–10.3)
Chloride: 97 mmol/L — ABNORMAL LOW (ref 98–111)
Creatinine, Ser: 0.82 mg/dL (ref 0.44–1.00)
GFR, Estimated: >60 mL/min (ref >60)
Glucose, Bld: 203 mg/dL — ABNORMAL HIGH (ref 70–99)
Potassium: 3.6 mmol/L (ref 3.5–5.1)
Sodium: 133 mmol/L — ABNORMAL LOW (ref 135–145)
Total Bilirubin: 0.9 mg/dL (ref 0.3–1.2)
Total Protein: 5.4 g/dL — ABNORMAL LOW (ref 6.5–8.1)

## 2021-12-02 LAB — CBC
HCT: 31.9 % — ABNORMAL LOW (ref 36.0–46.0)
Hemoglobin: 10.4 g/dL — ABNORMAL LOW (ref 12.0–15.0)
MCH: 30.6 pg (ref 26.0–34.0)
MCHC: 32.6 g/dL (ref 30.0–36.0)
MCV: 93.8 fL (ref 80.0–100.0)
Platelets: 180 10*3/uL (ref 150–400)
RBC: 3.4 MIL/uL — ABNORMAL LOW (ref 3.87–5.11)
RDW: 14 % (ref 11.5–15.5)
WBC: 11.1 10*3/uL — ABNORMAL HIGH (ref 4.0–10.5)
nRBC: 0 % (ref 0.0–0.2)

## 2021-12-02 LAB — GLUCOSE, CAPILLARY
Glucose-Capillary: 196 mg/dL — ABNORMAL HIGH (ref 70–99)
Glucose-Capillary: 185 mg/dL — ABNORMAL HIGH (ref 70–99)
Glucose-Capillary: 220 mg/dL — ABNORMAL HIGH (ref 70–99)
Glucose-Capillary: 192 mg/dL — ABNORMAL HIGH (ref 70–99)
Glucose-Capillary: 182 mg/dL — ABNORMAL HIGH (ref 70–99)

## 2021-12-02 LAB — LIPASE, BLOOD: Lipase: 22 U/L (ref 11–51)

## 2021-12-02 NOTE — Progress Notes (Signed)
PROGRESS NOTE  Judith [name] URK:[id number] DOB: 1941/01/28 DOA: 11/28/2021 PCP: Janie Morning, DO  HPI/Recap of past 67 hours: 80 year old female with medical history significant for normocytic anemia, osteoarthritis, biliary gastritis, cervical spine DJD, uncontrolled type 2 diabetes mellitus with neuropathy, depression, gout, hyperlipidemia, sleep apnea not on CPAP, who presented with abdominal pain nausea and vomiting on the day of admission.  CT of the abdomen and pelvis with contrast showed cholelithiasis and choledocholithiasis of up to 1.1 cm lobe GI was consulted and they did a difficult ERCP attempted November 30, 2021 and general surgery consulted for possible cholecystectomy  Subjective: Patient seen and examined at bedside she was sitting at the side of the bed did not have any problem.  Later this evening nurse reported patient was feeling weak and groggy, nurse will be holding her gabapentin also she desaturated to 80s and she had to put back her O2 on at 2 L/min. LFT elevated,Will hold her Crestor,   Assessment/Plan: Principal Problem:   Choledocholithiasis Active Problems:   Peripheral neuropathy   Hypertension   Gout   Sleep apnea   Hyperlipidemia   Type 2 diabetes mellitus with hyperglycemia ([place])   Polycythemia  #1 symptomatic choledocholithiasis CT scan is showing cholelithiasis of 1.6 cm Patient has elevated bilirubin and alkaline phosphatase.  Her LFTs are unremarkable Surgery is considering possible cholecystectomy:  2.  Complete heart block status post pacemaker pacemaker placement Rhythm is paced Cardiology consulted for preop clearance  3.  Elevated LFT likely due to choledocholithiasis patient is status post ERCP GI and surgery are on board   4.  Hypertension: Slightly uncontrolled Continue carvedilol twice daily and losartan As needed labetalol did ordered Monitor heart rate and blood pressure  5.  Type 2 diabetes mellitus with  hyperglycemia Patient was started with on Semglee Continue sliding scale insulin Last hemoglobin A1c 7.9  6.  Leukocytosis which is improving etiology is unclear patient is not on antibiotics for this   Code Status: Full  Severity of Illness: The appropriate patient status for this patient is INPATIENT. Inpatient status is judged to be reasonable and necessary in order to provide the required intensity of service to ensure the patient's safety. The patient's presenting symptoms, physical exam findings, and initial radiographic and laboratory data in the context of their chronic comorbidities is felt to place them at high risk for further clinical deterioration. Furthermore, it is not anticipated that the patient will be medically stable for discharge from the hospital within 2 midnights of admission.   * I certify that at the point of admission it is my clinical judgment that the patient will require inpatient hospital care spanning beyond 2 midnights from the point of admission due to high intensity of service, high risk for further deterioration and high frequency of surveillance required.*   Family Communication: None at bedside  Disposition Plan: Surgery Status is: Inpatient   Dispo: The patient is from: Home              Anticipated d/c is to:               Anticipated d/c date is:               Patient currently not medically stable for discharge  Consultants: Surgery GI Cardiology  Procedures: ERCP Biliary stent  Antimicrobials: None  DVT prophylaxis: SCD   Objective: Vitals:   12/01/21 0829 12/01/21 1138 12/01/21 2038 12/02/21 0516  BP: (!) 166/74 (!) 154/63 (!) 143/68 Marland Kitchen)  168/69  Pulse: 74 73 65 68  Resp:   14 16  Temp: 98.6 F (37 C) 99.1 F (37.3 C) 98.4 F (36.9 C) 98.9 F (37.2 C)  TempSrc: Oral Oral Oral Oral  SpO2: 95% 92% 91% 92%  Weight:      Height:       No intake or output data in the 24 hours ending 12/02/21 1047 Filed Weights   11/28/21  1348  Weight: 59 kg   Body mass index is 23.03 kg/m.  Exam:  General: 80 y.o. year-old female well developed well nourished in no acute distress.  Alert and oriented x3. Cardiovascular: Regular rate and rhythm with no rubs or gallops.  No thyromegaly or JVD noted.   Respiratory: Clear to auscultation with no wheezes or rales. Good inspiratory effort. Abdomen: Soft nontender nondistended with normal bowel sounds x4 quadrants. Musculoskeletal: No lower extremity edema. 2/4 pulses in all 4 extremities. Skin: No ulcerative lesions noted or rashes, Psychiatry: Mood is appropriate for condition and setting Neurology:    Data Reviewed: CBC: Recent Labs  Lab 11/28/21 1417 11/28/21 1439 11/29/21 0458 11/30/21 0458 12/01/21 0539 12/02/21 0528  WBC 15.3*  --  16.9* 15.2* 14.5* 11.1*  NEUTROABS 13.6*  --   --   --   --   --   HGB 16.0* 17.0* 12.7 11.9* 12.5 10.4*  HCT 48.3* 50.0* 39.2 36.8 38.4 31.9*  MCV 92.4  --  94.2 95.8 94.3 93.8  PLT 283  --  199 192 178 829   Basic Metabolic Panel: Recent Labs  Lab 11/28/21 1417 11/28/21 1439 11/29/21 0458 11/30/21 0458 12/01/21 0539 12/02/21 0528  NA 133* 135 133* 135 132* 133*  K 4.9 4.9 3.4* 3.7 4.2 3.6  CL 94* 97* 98 99 96* 97*  CO2 24  --  25 29 25 28   GLUCOSE 296* 296* 279* 182* 247* 203*  BUN 14 16 12 13 16 15   CREATININE 0.79 0.70 0.73 0.81 0.96 0.82  CALCIUM 9.7  --  8.9 8.4* 8.2* 8.0*   GFR: Estimated Creatinine Clearance: 44.5 mL/min (by C-G formula based on SCr of 0.82 mg/dL). Liver Function Tests: Recent Labs  Lab 11/28/21 1417 11/29/21 0458 11/30/21 0458 12/01/21 0539 12/02/21 0528  AST 33 138* 49* 280* 323*  ALT 14 43 27 117* 104*  ALKPHOS 66 96 72 135* 188*  BILITOT 1.5* 1.3* 0.9 0.8 0.9  PROT 8.3* 6.4* 5.7* 6.2* 5.4*  ALBUMIN 4.7 3.7 3.2* 3.3* 3.0*   Recent Labs  Lab 11/28/21 1417 12/02/21 0528  LIPASE 19 22   No results for input(s): AMMONIA in the last 168 hours. Coagulation Profile: No  results for input(s): INR, PROTIME in the last 168 hours. Cardiac Enzymes: No results for input(s): CKTOTAL, CKMB, CKMBINDEX, TROPONINI in the last 168 hours. BNP (last 3 results) No results for input(s): PROBNP in the last 8760 hours. HbA1C: Recent Labs    11/29/21 1207  HGBA1C 7.9*   CBG: Recent Labs  Lab 12/01/21 0818 12/01/21 1135 12/01/21 1600 12/01/21 2036 12/02/21 0802  GLUCAP 247* 238* 264* 175* 196*   Lipid Profile: No results for input(s): CHOL, HDL, LDLCALC, TRIG, CHOLHDL, LDLDIRECT in the last 72 hours. Thyroid Function Tests: No results for input(s): TSH, T4TOTAL, FREET4, T3FREE, THYROIDAB in the last 72 hours. Anemia Panel: No results for input(s): VITAMINB12, FOLATE, FERRITIN, TIBC, IRON, RETICCTPCT in the last 72 hours. Urine analysis:    Component Value Date/Time   COLORURINE YELLOW 01/04/2019 1331  APPEARANCEUR CLEAR 01/04/2019 1331   LABSPEC 1.015 01/04/2019 1331   PHURINE 5.0 01/04/2019 1331   GLUCOSEU NEGATIVE 01/04/2019 1331   HGBUR NEGATIVE 01/04/2019 1331   BILIRUBINUR NEGATIVE 01/04/2019 1331   BILIRUBINUR negative 12/20/2015 1516   KETONESUR 20 (A) 01/04/2019 1331   PROTEINUR NEGATIVE 01/04/2019 1331   UROBILINOGEN 0.2 12/20/2015 1516   NITRITE NEGATIVE 01/04/2019 1331   LEUKOCYTESUR NEGATIVE 01/04/2019 1331   Sepsis Labs: @LABRCNTIP (procalcitonin:4,lacticidven:4)  )No results found for this or any previous visit (from the past 240 hour(s)).    Studies: No results found.  Scheduled Meds:  carvedilol  25 mg Oral BID WC   colchicine  0.6 mg Oral Daily   DULoxetine  60 mg Oral Daily   gabapentin  100 mg Oral BID   gabapentin  400 mg Oral QHS   indomethacin  100 mg Rectal Once   insulin aspart  0-5 Units Subcutaneous QHS   insulin aspart  0-9 Units Subcutaneous TID WC   insulin glargine-yfgn  15 Units Subcutaneous Daily   losartan  100 mg Oral Daily   multivitamin with minerals  1 tablet Oral Daily   pantoprazole (PROTONIX) IV   40 mg Intravenous Q12H   potassium chloride  40 mEq Oral Once   rosuvastatin  10 mg Oral Daily    Continuous Infusions:  lactated ringers 100 mL/hr at 12/02/21 0916     LOS: 3 days     Cristal Deer, MD [facility]  To reach me or the doctor on call, go to: [email or web address] Password [email or web address]  12/02/2021, 10:47 AM

## 2021-12-02 NOTE — Progress Notes (Signed)
2 Days Post-Op   Subjective/Chief Complaint: ERCP yesterday.  Notes reviewed  Patient reports some abdominal pain in the upper abdomen this morning   Objective: Vital signs in last 24 hours: Temp:  [98.4 F (36.9 C)-99.1 F (37.3 C)] 98.9 F (37.2 C) (12/10 0516) Pulse Rate:  [65-73] 68 (12/10 0516) Resp:  [14-16] 16 (12/10 0516) BP: (143-168)/(63-69) 168/69 (12/10 0516) SpO2:  [91 %-92 %] 92 % (12/10 0516) Last BM Date:  (12/7)  Intake/Output from previous day: No intake/output data recorded. Intake/Output this shift: No intake/output data recorded.  Exam: Awake and alert Abdomen is soft and nondistended but there is some mild tenderness with guarding across the upper abdomen  Lab Results:  Recent Labs    12/01/21 0539 12/02/21 0528  WBC 14.5* 11.1*  HGB 12.5 10.4*  HCT 38.4 31.9*  PLT 178 180   BMET Recent Labs    12/01/21 0539 12/02/21 0528  NA 132* 133*  K 4.2 3.6  CL 96* 97*  CO2 25 28  GLUCOSE 247* 203*  BUN 16 15  CREATININE 0.96 0.82  CALCIUM 8.2* 8.0*   PT/INR No results for input(s): LABPROT, INR in the last 72 hours. ABG No results for input(s): PHART, HCO3 in the last 72 hours.  Invalid input(s): PCO2, PO2  Studies/Results: DG ERCP WITH SPHINCTEROTOMY  Result Date: 12/01/2021 CLINICAL DATA:  80 year old female with history of choledocholithiasis. EXAM: ERCP TECHNIQUE: Multiple spot images obtained with the fluoroscopic device and submitted for interpretation post-procedure. FLUOROSCOPY TIME:  Fluoroscopy Time:  6 minutes, 56 seconds Radiation Exposure Index (if provided by the fluoroscopic device): Provided. Number of Acquired Spot Images: 63 COMPARISON:  CT abdomen pelvis from 11/28/2021 FINDINGS: Fluoroscopic images demonstrate positioning of the endoscope near the second portion the duodenum with retrograde wire cannulation of the common bile duct. Cholangiogram was performed which demonstrated filling defects in the distal common bile  duct compatible with known choledocholithiasis. No significant intrahepatic biliary ductal dilation is noted. Mild extrahepatic biliary ductal dilation. Upon completion, there was placement of a plastic common bile duct stent with decompression of the biliary tree. IMPRESSION: Choledocholithiasis with mild extrahepatic biliary ductal dilation. Placement of a common bile duct stent. These images were submitted for radiologic interpretation only. Please see the procedural report for full procedural details and the amount of contrast and the fluoroscopy time utilized. Ruthann Cancer, MD [facility] Radiology Electronically Signed   By: Ruthann Cancer M.D.   On: 12/01/2021 08:03   DG C-Arm 1-60 Min-No Report  Result Date: 11/30/2021 Fluoroscopy was utilized by the requesting physician.  No radiographic interpretation.   DG C-Arm 1-60 Min-No Report  Result Date: 11/30/2021 Fluoroscopy was utilized by the requesting physician.  No radiographic interpretation.    Anti-infectives: Anti-infectives (From admission, onward)    Start     Dose/Rate Route Frequency Ordered Stop   11/30/21 1415  ciprofloxacin (CIPRO) IVPB 400 mg        400 mg 200 mL/hr over 60 Minutes Intravenous  Once 11/30/21 1402 11/30/21 1501       Assessment/Plan: Choledocholithiasis  Past medical history of Billroth II gastrojejunostomy due to gastric and duodenal ulcers  Results of the ERCP were reviewed.  The patient underwent a fistulotomy. Cardiology has evaluated patient and has cleared for any surgical intervention  Will allow liquids today.  We will hold on any surgery until least Monday as this could be quite complex given her previous surgery and could even require a  common duct exploration.  We will continue evaluate her labs to see if there are any trends.  We will start her on clear liquids today to see how this is tolerated.  Coralie Keens 12/02/2021

## 2021-12-02 NOTE — Progress Notes (Signed)
Patient's daughter expressed concern over drowsiness. Upon reviewing the chart it appears the patient had dilaudid around 1300. Gabapentin held. Vital signs taken. BP in 256'H systolic, patient paced at 80. O2 sat noted to be low @ 86% on RA. Patient placed on 2L nasal cannula. Blood sugar checked, 192. Required 2 units of insulin administered. MD informed.

## 2021-12-02 NOTE — Progress Notes (Signed)
Cardiologist:  Croitoru   Subjective:  Denies SSCP, palpitations or Dyspnea For GB surgery today   Objective:  Vitals:   12/01/21 0829 12/01/21 1138 12/01/21 2038 12/02/21 0516  BP: (!) 166/74 (!) 154/63 (!) 143/68 (!) 168/69  Pulse: 74 73 65 68  Resp:   14 16  Temp: 98.6 F (37 C) 99.1 F (37.3 C) 98.4 F (36.9 C) 98.9 F (37.2 C)  TempSrc: Oral Oral Oral Oral  SpO2: 95% 92% 91% 92%  Weight:      Height:        Intake/Output from previous day: No intake or output data in the 24 hours ending 12/02/21 0902  Physical Exam: Affect appropriate Healthy:  appears stated age 80: normal Neck supple with no adenopathy JVP normal no bruits no thyromegaly Lungs clear with no wheezing and good diaphragmatic motion Heart:  S1/S2 no murmur, no rub, gallop or click PMI normal Abdomen: benighn, BS positve, no tenderness, no AAA no bruit.  No HSM or HJR Distal pulses intact with no bruits No edema Neuro non-focal Skin warm and dry No muscular weakness   Lab Results: Basic Metabolic Panel: Recent Labs    12/01/21 0539 12/02/21 0528  NA 132* 133*  K 4.2 3.6  CL 96* 97*  CO2 25 28  GLUCOSE 247* 203*  BUN 16 15  CREATININE 0.96 0.82  CALCIUM 8.2* 8.0*   Liver Function Tests: Recent Labs    12/01/21 0539 12/02/21 0528  AST 280* 323*  ALT 117* 104*  ALKPHOS 135* 188*  BILITOT 0.8 0.9  PROT 6.2* 5.4*  ALBUMIN 3.3* 3.0*   Recent Labs    12/02/21 0528  LIPASE 22   CBC: Recent Labs    12/01/21 0539 12/02/21 0528  WBC 14.5* 11.1*  HGB 12.5 10.4*  HCT 38.4 31.9*  MCV 94.3 93.8  PLT 178 180   Cardiac Enzymes: No results for input(s): CKTOTAL, CKMB, CKMBINDEX, TROPONINI in the last 72 hours. BNP: Invalid input(s): POCBNP D-Dimer: No results for input(s): DDIMER in the last 72 hours. Hemoglobin A1C: Recent Labs    11/29/21 1207  HGBA1C 7.9*   Fasting Lipid Panel: No results for input(s): CHOL, HDL, LDLCALC, TRIG, CHOLHDL, LDLDIRECT in the  last 72 hours. Thyroid Function Tests: No results for input(s): TSH, T4TOTAL, T3FREE, THYROIDAB in the last 72 hours.  Invalid input(s): FREET3 Anemia Panel: No results for input(s): VITAMINB12, FOLATE, FERRITIN, TIBC, IRON, RETICCTPCT in the last 72 hours.  Imaging: DG ERCP WITH SPHINCTEROTOMY  Result Date: 12/01/2021 CLINICAL DATA:  80 year old female with history of choledocholithiasis. EXAM: ERCP TECHNIQUE: Multiple spot images obtained with the fluoroscopic device and submitted for interpretation post-procedure. FLUOROSCOPY TIME:  Fluoroscopy Time:  6 minutes, 56 seconds Radiation Exposure Index (if provided by the fluoroscopic device): Provided. Number of Acquired Spot Images: 63 COMPARISON:  CT abdomen pelvis from 11/28/2021 FINDINGS: Fluoroscopic images demonstrate positioning of the endoscope near the second portion the duodenum with retrograde wire cannulation of the common bile duct. Cholangiogram was performed which demonstrated filling defects in the distal common bile duct compatible with known choledocholithiasis. No significant intrahepatic biliary ductal dilation is noted. Mild extrahepatic biliary ductal dilation. Upon completion, there was placement of a plastic common bile duct stent with decompression of the biliary tree. IMPRESSION: Choledocholithiasis with mild extrahepatic biliary ductal dilation. Placement of a common bile duct stent. These images were submitted for radiologic interpretation only. Please see the procedural report for full procedural details and the amount of contrast and  the fluoroscopy time utilized. Ruthann Cancer, MD [facility] Radiology Electronically Signed   By: Ruthann Cancer M.D.   On: 12/01/2021 08:03   DG C-Arm 1-60 Min-No Report  Result Date: 11/30/2021 Fluoroscopy was utilized by the requesting physician.  No radiographic interpretation.   DG C-Arm 1-60 Min-No Report  Result Date:  11/30/2021 Fluoroscopy was utilized by the requesting physician.  No radiographic interpretation.    Cardiac Studies:  ECG: P synch pacing    Telemetry:  NSR 12/02/2021   Echo: None  Medications:    carvedilol  25 mg Oral BID WC   colchicine  0.6 mg Oral Daily   DULoxetine  60 mg Oral Daily   gabapentin  100 mg Oral BID   gabapentin  400 mg Oral QHS   indomethacin  100 mg Rectal Once   insulin aspart  0-5 Units Subcutaneous QHS   insulin aspart  0-9 Units Subcutaneous TID WC   insulin glargine-yfgn  15 Units Subcutaneous Daily   losartan  100 mg Oral Daily   multivitamin with minerals  1 tablet Oral Daily   pantoprazole (PROTONIX) IV  40 mg Intravenous Q12H   potassium chloride  40 mEq Oral Once   rosuvastatin  10 mg Oral Daily      lactated ringers 100 mL/hr at 12/01/21 2241    Assessment/Plan:   Preoperative :  clear to have GB surgery good functional status no history of CAD or CHF PPM:  normal function  GB:  bilroth 2 post ERCP for cholecystectomy today   Jenkins Rouge 12/02/2021, 9:02 AM

## 2021-12-03 DIAGNOSIS — M1000 Idiopathic gout, unspecified site: Secondary | ICD-10-CM

## 2021-12-03 DIAGNOSIS — E1165 Type 2 diabetes mellitus with hyperglycemia: Secondary | ICD-10-CM

## 2021-12-03 LAB — COMPREHENSIVE METABOLIC PANEL
ALT: 223 U/L — ABNORMAL HIGH (ref 0–44)
AST: 573 U/L — ABNORMAL HIGH (ref 15–41)
Albumin: 3.1 g/dL — ABNORMAL LOW (ref 3.5–5.0)
Alkaline Phosphatase: 312 U/L — ABNORMAL HIGH (ref 38–126)
Anion gap: 9 (ref 5–15)
BUN: 15 mg/dL (ref 8–23)
CO2: 28 mmol/L (ref 22–32)
Calcium: 8.2 mg/dL — ABNORMAL LOW (ref 8.9–10.3)
Chloride: 97 mmol/L — ABNORMAL LOW (ref 98–111)
Creatinine, Ser: 0.88 mg/dL (ref 0.44–1.00)
GFR, Estimated: >60 mL/min (ref >60)
Glucose, Bld: 145 mg/dL — ABNORMAL HIGH (ref 70–99)
Potassium: 3.7 mmol/L (ref 3.5–5.1)
Sodium: 134 mmol/L — ABNORMAL LOW (ref 135–145)
Total Bilirubin: 3.3 mg/dL — ABNORMAL HIGH (ref 0.3–1.2)
Total Protein: 5.6 g/dL — ABNORMAL LOW (ref 6.5–8.1)

## 2021-12-03 LAB — RESP PANEL BY RT-PCR (FLU A&B, COVID) ARPGX2
Influenza A by PCR: NEGATIVE
Influenza B by PCR: NEGATIVE
SARS Coronavirus 2 by RT PCR: NEGATIVE

## 2021-12-03 LAB — GLUCOSE, CAPILLARY
Glucose-Capillary: 158 mg/dL — ABNORMAL HIGH (ref 70–99)
Glucose-Capillary: 181 mg/dL — ABNORMAL HIGH (ref 70–99)
Glucose-Capillary: 174 mg/dL — ABNORMAL HIGH (ref 70–99)
Glucose-Capillary: 166 mg/dL — ABNORMAL HIGH (ref 70–99)

## 2021-12-03 NOTE — Assessment & Plan Note (Signed)
--   Stable.  Continue long-acting insulin and sliding scale.

## 2021-12-03 NOTE — Assessment & Plan Note (Signed)
--   Continue carvedilol, losartan.

## 2021-12-03 NOTE — Assessment & Plan Note (Signed)
--   Continue gabapentin and duloxetine.

## 2021-12-03 NOTE — Progress Notes (Signed)
  Progress Note Judith [name]   LGX:[id number]  DOB: May 20, 1941  DOA: 11/28/2021     4 Date of Service: 12/03/2021   Clinical Course 80 year old woman presented with emesis, nausea, abdominal pain.  CT showed cholelithiasis, choledocholithiasis. --Admitted for further evaluation and treatment of choledocholithiasis and symptomatic cholelithiasis.  Status post extremely difficult ERCP 12/8.  Plan for cholecystectomy.  Assessment and Plan * Choledocholithiasis -- Status post difficult ERCP by gastroenterology with biliary stent placement. ERCP follow-up in 6 to 10 weeks for stent removal, sphincterotomy extension and stone removal.  -- Plan for cholecystectomy per general surgery this hospitalization.  Type 2 diabetes mellitus with hyperglycemia ([facility]) -- Stable.  Continue long-acting insulin and sliding scale.  Gout -- continue colchicine  Benign essential HTN -- Continue carvedilol, losartan.  Peripheral neuropathy -- Continue gabapentin and duloxetine.  Presence of cardiac pacemaker --cleared by cardiology for surgery  Subjective:  Feels ok today.  Objective Vitals:   12/02/21 1701 12/02/21 2137 12/03/21 0557 12/03/21 1531  BP:  (!) 132/57 (!) 155/78 (!) 159/69  Pulse:  75 64 64  Resp:  14 18 16   Temp:  99.3 F (37.4 C) 98.5 F (36.9 C) 98.6 F (37 C)  TempSrc:    Oral  SpO2: (!) 88% 97% 94% 90%  Weight:      Height:       59 kg  Vital signs were reviewed and unremarkable.  Exam Physical Exam Vitals reviewed.  Constitutional:      General: She is not in acute distress.    Appearance: She is not ill-appearing.  Cardiovascular:     Rate and Rhythm: Normal rate and regular rhythm.     Heart sounds: No murmur heard. Pulmonary:     Effort: Pulmonary effort is normal. No respiratory distress.     Breath sounds: No wheezing, rhonchi or rales.  Neurological:     Mental Status: She is alert.  Psychiatric:        Mood and Affect: Mood normal.        Behavior:  Behavior normal.    Labs / Other Information My review of labs, imaging, notes and other tests is significant for     Total bilirubin now up to 3.3, transaminases and alkaline phosphatase trending up  Disposition Plan: Status is: Inpatient  Remains inpatient appropriate because: LFTs trending up, needs cholecystectomy   SCDs  Time spent: 35 minutes [facility] 12/03/2021, 6:35 PM

## 2021-12-03 NOTE — Progress Notes (Signed)
Cardiologist:  Croitoru   Subjective:  Denies SSCP, palpitations or Dyspnea GB surgery postponed till Monday   Objective:  Vitals:   12/02/21 1233 12/02/21 1701 12/02/21 2137 12/03/21 0557  BP: (!) 174/65  (!) 132/57 (!) 155/78  Pulse: 61  75 64  Resp: 18  14 18   Temp: 97.8 F (36.6 C)  99.3 F (37.4 C) 98.5 F (36.9 C)  TempSrc: Oral     SpO2: 94% (!) 88% 97% 94%  Weight:      Height:        Intake/Output from previous day:  Intake/Output Summary (Last 24 hours) at 12/03/2021 8469 Last data filed at 12/02/2021 1236 Gross per 24 hour  Intake --  Output 300 ml  Net -300 ml    Physical Exam: Affect appropriate Healthy:  appears stated age HEENT: normal Neck supple with no adenopathy JVP normal no bruits no thyromegaly Lungs clear with no wheezing and good diaphragmatic motion Heart:  S1/S2 no murmur, no rub, gallop or click PMI normal Abdomen: benighn, BS positve, no tenderness, no AAA no bruit.  No HSM or HJR Distal pulses intact with no bruits No edema Neuro non-focal Skin warm and dry No muscular weakness   Lab Results: Basic Metabolic Panel: Recent Labs    12/02/21 0528 12/03/21 0530  NA 133* 134*  K 3.6 3.7  CL 97* 97*  CO2 28 28  GLUCOSE 203* 145*  BUN 15 15  CREATININE 0.82 0.88  CALCIUM 8.0* 8.2*   Liver Function Tests: Recent Labs    12/02/21 0528 12/03/21 0530  AST 323* 573*  ALT 104* 223*  ALKPHOS 188* 312*  BILITOT 0.9 3.3*  PROT 5.4* 5.6*  ALBUMIN 3.0* 3.1*   Recent Labs    12/02/21 0528  LIPASE 22   CBC: Recent Labs    12/01/21 0539 12/02/21 0528  WBC 14.5* 11.1*  HGB 12.5 10.4*  HCT 38.4 31.9*  MCV 94.3 93.8  PLT 178 180   Cardiac Enzymes: No results for input(s): CKTOTAL, CKMB, CKMBINDEX, TROPONINI in the last 72 hours. BNP: Invalid input(s): POCBNP D-Dimer: No results for input(s): DDIMER in the last 72 hours. Hemoglobin A1C: No results for input(s): HGBA1C in the last 72 hours.  Fasting Lipid  Panel: No results for input(s): CHOL, HDL, LDLCALC, TRIG, CHOLHDL, LDLDIRECT in the last 72 hours. Thyroid Function Tests: No results for input(s): TSH, T4TOTAL, T3FREE, THYROIDAB in the last 72 hours.  Invalid input(s): FREET3 Anemia Panel: No results for input(s): VITAMINB12, FOLATE, FERRITIN, TIBC, IRON, RETICCTPCT in the last 72 hours.  Imaging: No results found.  Cardiac Studies:  ECG: P synch pacing    Telemetry:  NSR 12/03/2021   Echo: None  Medications:    carvedilol  25 mg Oral BID WC   colchicine  0.6 mg Oral Daily   DULoxetine  60 mg Oral Daily   gabapentin  100 mg Oral BID   gabapentin  400 mg Oral QHS   indomethacin  100 mg Rectal Once   insulin aspart  0-5 Units Subcutaneous QHS   insulin aspart  0-9 Units Subcutaneous TID WC   insulin glargine-yfgn  15 Units Subcutaneous Daily   losartan  100 mg Oral Daily   multivitamin with minerals  1 tablet Oral Daily   pantoprazole (PROTONIX) IV  40 mg Intravenous Q12H   potassium chloride  40 mEq Oral Once      lactated ringers 100 mL/hr at 12/03/21 0546    Assessment/Plan:   Preoperative :  clear to have GB surgery good functional status no history of CAD or CHF PPM:  normal function  GB:  bilroth 2 post ERCP for cholecystectomy Monday   Cardiology will sign off  Call with any post op concerns   Jenkins Rouge 12/03/2021, 8:33 AM

## 2021-12-03 NOTE — Hospital Course (Addendum)
80 year old woman presented with emesis, nausea, abdominal pain.  CT showed cholelithiasis, choledocholithiasis. --Admitted for further evaluation and treatment of choledocholithiasis and symptomatic cholelithiasis.  Status post extremely difficult ERCP 12/8.  Due to difficult anatomy it took some time to discern whether operation could be performed here.  Plan is now to proceed with surgery 12/14.

## 2021-12-03 NOTE — Assessment & Plan Note (Signed)
--  cleared by cardiology for surgery

## 2021-12-03 NOTE — Assessment & Plan Note (Addendum)
--   continue colchicine

## 2021-12-03 NOTE — Progress Notes (Signed)
3 Days Post-Op   Subjective/Chief Complaint: Patient reports minimal abdominal pain this morning.  She tolerated full liquids yesterday   Objective: Vital signs in last 24 hours: Temp:  [97.8 F (36.6 C)-99.3 F (37.4 C)] 98.5 F (36.9 C) (12/11 0557) Pulse Rate:  [61-75] 64 (12/11 0557) Resp:  [14-18] 18 (12/11 0557) BP: (132-174)/(57-78) 155/78 (12/11 0557) SpO2:  [88 %-97 %] 94 % (12/11 0557) Last BM Date:  (12/7)  Intake/Output from previous day: 12/10 0701 - 12/11 0700 In: 0  Out: 300 [Urine:300] Intake/Output this shift: No intake/output data recorded.  Exam: She is awake and alert.  She looks more comfortable than yesterday Abdomen is soft with minimal epigastric tenderness and there was no guarding  Lab Results:  Recent Labs    12/01/21 0539 12/02/21 0528  WBC 14.5* 11.1*  HGB 12.5 10.4*  HCT 38.4 31.9*  PLT 178 180   BMET Recent Labs    12/02/21 0528 12/03/21 0530  NA 133* 134*  K 3.6 3.7  CL 97* 97*  CO2 28 28  GLUCOSE 203* 145*  BUN 15 15  CREATININE 0.82 0.88  CALCIUM 8.0* 8.2*   PT/INR No results for input(s): LABPROT, INR in the last 72 hours. ABG No results for input(s): PHART, HCO3 in the last 72 hours.  Invalid input(s): PCO2, PO2  Studies/Results: No results found.  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  This is concerning that there may still be stones in the bile duct. Will make her n.p.o. tonight after midnight and plan to repeat the liver function test in the morning.  Will defer to my partner tomorrow whether or not to proceed with a cholecystectomy  and cholangiogram with the risk for a need for a bile duct exploration.  Coralie Keens 12/03/2021

## 2021-12-03 NOTE — Assessment & Plan Note (Addendum)
--   Status post difficult ERCP by gastroenterology with biliary stent placement. ERCP follow-up in 6 to 10 weeks for stent removal, sphincterotomy extension and stone removal.  -- Plan for cholecystectomy 12/14

## 2021-12-04 ENCOUNTER — Other Ambulatory Visit: Payer: Self-pay

## 2021-12-04 ENCOUNTER — Telehealth: Payer: Self-pay

## 2021-12-04 ENCOUNTER — Encounter: Payer: Self-pay | Admitting: Gastroenterology

## 2021-12-04 DIAGNOSIS — K2960 Other gastritis without bleeding: Secondary | ICD-10-CM

## 2021-12-04 DIAGNOSIS — K8050 Calculus of bile duct without cholangitis or cholecystitis without obstruction: Secondary | ICD-10-CM

## 2021-12-04 DIAGNOSIS — K838 Other specified diseases of biliary tract: Secondary | ICD-10-CM

## 2021-12-04 DIAGNOSIS — R7989 Other specified abnormal findings of blood chemistry: Secondary | ICD-10-CM

## 2021-12-04 LAB — COMPREHENSIVE METABOLIC PANEL
ALT: 132 U/L — ABNORMAL HIGH (ref 0–44)
AST: 173 U/L — ABNORMAL HIGH (ref 15–41)
Albumin: 3 g/dL — ABNORMAL LOW (ref 3.5–5.0)
Alkaline Phosphatase: 243 U/L — ABNORMAL HIGH (ref 38–126)
Anion gap: 9 (ref 5–15)
BUN: 8 mg/dL (ref 8–23)
CO2: 28 mmol/L (ref 22–32)
Calcium: 8.1 mg/dL — ABNORMAL LOW (ref 8.9–10.3)
Chloride: 98 mmol/L (ref 98–111)
Creatinine, Ser: 0.72 mg/dL (ref 0.44–1.00)
GFR, Estimated: >60 mL/min (ref >60)
Glucose, Bld: 95 mg/dL (ref 70–99)
Potassium: 3.4 mmol/L — ABNORMAL LOW (ref 3.5–5.1)
Sodium: 135 mmol/L (ref 135–145)
Total Bilirubin: 3.2 mg/dL — ABNORMAL HIGH (ref 0.3–1.2)
Total Protein: 5.6 g/dL — ABNORMAL LOW (ref 6.5–8.1)

## 2021-12-04 LAB — GLUCOSE, CAPILLARY
Glucose-Capillary: 82 mg/dL (ref 70–99)
Glucose-Capillary: 85 mg/dL (ref 70–99)
Glucose-Capillary: 133 mg/dL — ABNORMAL HIGH (ref 70–99)
Glucose-Capillary: 95 mg/dL (ref 70–99)

## 2021-12-04 LAB — SURGICAL PATHOLOGY

## 2021-12-04 MED ORDER — POTASSIUM CHLORIDE CRYS ER 20 MEQ PO TBCR
40.0000 meq | EXTENDED_RELEASE_TABLET | Freq: Once | ORAL | Status: AC
  Administered 2021-12-04: 40 meq via ORAL
  Filled 2021-12-04: qty 2

## 2021-12-04 MED ORDER — SALINE SPRAY 0.65 % NA SOLN
1.0000 | NASAL | Status: DC | PRN
  Administered 2021-12-05: 1 via NASAL
  Filled 2021-12-04 (×2): qty 44

## 2021-12-04 NOTE — Telephone Encounter (Signed)
ERCP scheduled 02/01/22 at [facility] at 945 am with GM   The pt has been mailed a copy of the instructions, a copy sent to My Chart and the pt will be given the information at discharge

## 2021-12-04 NOTE — Telephone Encounter (Signed)
-----   Message from Irving Copas., MD sent at 12/03/2021  6:43 AM EST ----- ERCP follow-up in 6 to 8 weeks.  Thanks. GM

## 2021-12-04 NOTE — Progress Notes (Signed)
  Progress Note Judith [name]   [id number]  DOB: 1941-03-14  DOA: 11/28/2021     5 Date of Service: 12/04/2021   Clinical Course 80 year old woman presented with emesis, nausea, abdominal pain.  CT showed cholelithiasis, choledocholithiasis. --Admitted for further evaluation and treatment of choledocholithiasis and symptomatic cholelithiasis.  Status post extremely difficult ERCP 12/8.  Due to difficult anatomy operation can be performed here.  Further direction awaited from surgical service.  Assessment and Plan * Choledocholithiasis -- Status post difficult ERCP by gastroenterology with biliary stent placement. ERCP follow-up in 6 to 10 weeks for stent removal, sphincterotomy extension and stone removal.  -- Plan for cholecystectomy, however it is likely this can be performed at this facility.  Further direction awaited from surgery service.  Type 2 diabetes mellitus with hyperglycemia (HCC) -- Stable.  Continue long-acting insulin and sliding scale.  Gout -- continue colchicine  Benign essential HTN -- Continue carvedilol, losartan.  Peripheral neuropathy -- Continue gabapentin and duloxetine.  Presence of cardiac pacemaker --cleared by cardiology for surgery   Subjective:  Feels ok, wants to eat, no pain.  Objective Vitals:   12/03/21 1531 12/03/21 2015 12/04/21 0452 12/04/21 1319  BP: (!) 159/69 (!) 159/67 (!) 157/64 (!) 157/88  Pulse: 64 65 66 72  Resp: 16 20 14 16   Temp: 98.6 F (37 C) 99.4 F (37.4 C) 99.7 F (37.6 C) 98 F (36.7 C)  TempSrc: Oral Oral Oral Oral  SpO2: 90% 95% 95% 97%  Weight:      Height:       59 kg  Vital signs were reviewed and unremarkable.   Exam Physical Exam Vitals reviewed.  Constitutional:      General: She is not in acute distress.    Appearance: She is not ill-appearing.  Cardiovascular:     Rate and Rhythm: Normal rate and regular rhythm.     Heart sounds: No murmur heard. Pulmonary:     Effort: Pulmonary effort  is normal. No respiratory distress.     Breath sounds: No wheezing, rhonchi or rales.  Abdominal:     General: There is no distension.  Neurological:     Mental Status: She is alert.  Psychiatric:        Mood and Affect: Mood normal.        Behavior: Behavior normal.     Labs / Other Information My review of labs, imaging, notes and other tests is significant for     Potassium 3.4, alkaline phosphatase, AST, ALT, total bilirubin slightly improved.  Disposition Plan: Status is: Inpatient  Remains inpatient appropriate because: Needs cholecystectomy.  SCDs     Time spent: 20 minutes [facility] 12/04/2021, 7:17 PM

## 2021-12-04 NOTE — Progress Notes (Signed)
Patient's daughter states she wants a final decision by general surgery by tomorrow, December 05, 2021, on if the gallbladder surgery will be performed at [facility] or not. Patient and patient's daughter stated that they would prefer to be transferred to [facility] or [facility] to have surgery, if [name] is not equipped or able to perform the surgery. Hospitalist made aware and directed me to general surgery.  Layla Maw, RN

## 2021-12-04 NOTE — TOC Progression Note (Signed)
Transition of Care [facility]) - Progression Note    Patient Details  Name: Judith [name] MRN: [id number] Date of Birth: 27-Mar-1941  [facility]) CM/SW Contact  Leeroy Cha, RN Phone Number: 12/04/2021, 7:56 AM  Clinical Narrative:     Transition of Care [facility]) Screening Note   Patient Details  Name: Judith [name] Date of Birth: 1941-12-04   Transition of Care [facility]) CM/SW Contact:    Leeroy Cha, RN Phone Number: 12/04/2021, 7:56 AM    Transition of Care Department [facility]) has reviewed patient and no TOC needs have been identified at this time. We will continue to monitor patient advancement through interdisciplinary progression rounds. If new patient transition needs arise, please place a TOC consult.  Patient is from [facility] n[facility] plan is for family to transport back when ready for dc.  Will need update pt note for the fl2.   Expected Discharge Plan: Assisted Living Barriers to Discharge: Continued Medical Work up  Expected Discharge Plan and Services Expected Discharge Plan: Assisted Living   Discharge Planning Services: CM Consult                                           Social Determinants of Health (SDOH) Interventions    Readmission Risk Interventions No flowsheet data found.

## 2021-12-04 NOTE — Progress Notes (Signed)
  4 Days Post-Op  Subjective: CC: Patient reports that her abdominal pain has improved and is more soreness of her upper abdomen now. No n/v. T. Bili remains elevated at 3.2.  Reports her B2 surgery was in 2007 in [place]  Objective: Vital signs in last 24 hours: Temp:  [98.6 F (37 C)-99.7 F (37.6 C)] 99.7 F (37.6 C) (12/12 0452) Pulse Rate:  [64-66] 66 (12/12 0452) Resp:  [14-20] 14 (12/12 0452) BP: (157-159)/(64-69) 157/64 (12/12 0452) SpO2:  [90 %-95 %] 95 % (12/12 0452) Last BM Date: 11/29/21  Intake/Output from previous day: 12/11 0701 - 12/12 0700 In: 1727.9 [P.O.:600; I.V.:1127.9] Out: 1000 [Urine:1000] Intake/Output this shift: Total I/O In: -  Out: 800 [Urine:800]  PE: Gen:  Alert, NAD, pleasant Pulm: Rate and effort normal Abd: Soft, ND, upper abdominal tenderness without peritonitis, +BS. Prior midline incision well healed.  Psych: A&Ox4 Skin: no rashes noted, warm and dry  Lab Results:  Recent Labs    12/02/21 0528  WBC 11.1*  HGB 10.4*  HCT 31.9*  PLT 180   BMET Recent Labs    12/03/21 0530 12/04/21 0448  NA 134* 135  K 3.7 3.4*  CL 97* 98  CO2 28 28  GLUCOSE 145* 95  BUN 15 8  CREATININE 0.88 0.72  CALCIUM 8.2* 8.1*   PT/INR No results for input(s): LABPROT, INR in the last 72 hours. CMP     Component Value Date/Time   NA 135 12/04/2021 0448   NA 140 02/14/2021 1017   K 3.4 (L) 12/04/2021 0448   CL 98 12/04/2021 0448   CO2 28 12/04/2021 0448   GLUCOSE 95 12/04/2021 0448   BUN 8 12/04/2021 0448   BUN 15 02/14/2021 1017   CREATININE 0.72 12/04/2021 0448   CALCIUM 8.1 (L) 12/04/2021 0448   PROT 5.6 (L) 12/04/2021 0448   PROT 6.7 02/14/2021 1017   ALBUMIN 3.0 (L) 12/04/2021 0448   ALBUMIN 4.2 02/14/2021 1017   AST 173 (H) 12/04/2021 0448   ALT 132 (H) 12/04/2021 0448   ALKPHOS 243 (H) 12/04/2021 0448   BILITOT 3.2 (H) 12/04/2021 0448   BILITOT 0.5 02/14/2021 1017   GFRNONAA >60 12/04/2021 0448   GFRAA 61  02/14/2021 1017   Lipase     Component Value Date/Time   LIPASE 22 12/02/2021 0528    Studies/Results: No results found.  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 Mansouraty on 12/8. During the procedure filling defects consistent with stones and sludge were seen on the cholangiogram and biliary tree was swept and sludge was found; gastritis was biopsied.  Based on procedure note Dr. Mansouraty was able to access the duodenum via the stomach as well as gain access to the efferent and afferent limbs of the gastrojejunostomy. - Cleared by Cardiology - Patient's AST/ALT/Alk Phos improved from yesterday but T. Bili still elevated at 3.2 from 3.3 (was wnl on 12/10). Given rise in T. Bili after ERCP there is concern for continued Choledocholithiasis. Will discuss with MD about   how to proceed (re-engaging GI for another ERCP vs proceeding Lap Chole w/ IOC).   - Further recs to follow   FEN: Keep NPO for now  ID: Cipro preop. None currently.  VTE: SCDs, okay for chemical ppx from surgical perspective   Complete heart block status post pacemaker placement Hypertension Hyperlipidemia Diabetes mellitus -A1c 7.9   LOS: 5 days     M  , PA-C [facility] 12/04/2021, 9:37 AM Please see Amion for pager number during day hours 7:00am-4:30pm  

## 2021-12-04 NOTE — Care Management Important Message (Signed)
Important Message  Patient Details IM Letter given to the Patient. Name: Judith [name] MRN: [id number] Date of Birth: [birth year]   Medicare Important Message Given:  Yes     Kerin Salen 12/04/2021, 1:06 PM

## 2021-12-05 LAB — COMPREHENSIVE METABOLIC PANEL WITH GFR
ALT: 80 U/L — ABNORMAL HIGH (ref 0–44)
AST: 59 U/L — ABNORMAL HIGH (ref 15–41)
Albumin: 2.7 g/dL — ABNORMAL LOW (ref 3.5–5.0)
Alkaline Phosphatase: 215 U/L — ABNORMAL HIGH (ref 38–126)
Anion gap: 10 (ref 5–15)
BUN: 8 mg/dL (ref 8–23)
CO2: 29 mmol/L (ref 22–32)
Calcium: 8.2 mg/dL — ABNORMAL LOW (ref 8.9–10.3)
Chloride: 96 mmol/L — ABNORMAL LOW (ref 98–111)
Creatinine, Ser: 0.8 mg/dL (ref 0.44–1.00)
GFR, Estimated: >60 mL/min
Glucose, Bld: 123 mg/dL — ABNORMAL HIGH (ref 70–99)
Potassium: 3.7 mmol/L (ref 3.5–5.1)
Sodium: 135 mmol/L (ref 135–145)
Total Bilirubin: 2 mg/dL — ABNORMAL HIGH (ref 0.3–1.2)
Total Protein: 5.2 g/dL — ABNORMAL LOW (ref 6.5–8.1)

## 2021-12-05 LAB — GLUCOSE, CAPILLARY
Glucose-Capillary: 70 mg/dL (ref 70–99)
Glucose-Capillary: 142 mg/dL — ABNORMAL HIGH (ref 70–99)
Glucose-Capillary: 126 mg/dL — ABNORMAL HIGH (ref 70–99)
Glucose-Capillary: 107 mg/dL — ABNORMAL HIGH (ref 70–99)
Glucose-Capillary: 142 mg/dL — ABNORMAL HIGH (ref 70–99)
Glucose-Capillary: 119 mg/dL — ABNORMAL HIGH (ref 70–99)

## 2021-12-05 LAB — CBC
HCT: 34.3 % — ABNORMAL LOW (ref 36.0–46.0)
Hemoglobin: 11.3 g/dL — ABNORMAL LOW (ref 12.0–15.0)
MCH: 30.5 pg (ref 26.0–34.0)
MCHC: 32.9 g/dL (ref 30.0–36.0)
MCV: 92.7 fL (ref 80.0–100.0)
Platelets: 190 K/uL (ref 150–400)
RBC: 3.7 MIL/uL — ABNORMAL LOW (ref 3.87–5.11)
RDW: 14.6 % (ref 11.5–15.5)
WBC: 12.6 K/uL — ABNORMAL HIGH (ref 4.0–10.5)
nRBC: 0 % (ref 0.0–0.2)

## 2021-12-05 MED ORDER — DIPHENHYDRAMINE HCL 50 MG/ML IJ SOLN
25.0000 mg | Freq: Four times a day (QID) | INTRAMUSCULAR | Status: DC | PRN
  Administered 2021-12-05: 25 mg via INTRAVENOUS
  Filled 2021-12-05: qty 1

## 2021-12-05 MED ORDER — CIPROFLOXACIN IN D5W 400 MG/200ML IV SOLN
400.0000 mg | Freq: Two times a day (BID) | INTRAVENOUS | Status: DC
  Filled 2021-12-05: qty 200

## 2021-12-05 NOTE — Progress Notes (Signed)
°  Progress Note Judith [name]   JOI:[id number]  DOB: [birth year]  DOA: 11/28/2021     6 Date of Service: 12/05/2021   Clinical Course 80 year old woman presented with emesis, nausea, abdominal pain.  CT showed cholelithiasis, choledocholithiasis. --Admitted for further evaluation and treatment of choledocholithiasis and symptomatic cholelithiasis.  Status post extremely difficult ERCP 12/8.  Due to difficult anatomy it took some time to discern whether operation could be performed here.  Plan is now to proceed with surgery 12/14.  Assessment and Plan * Choledocholithiasis -- Status post difficult ERCP by gastroenterology with biliary stent placement. ERCP follow-up in 6 to 10 weeks for stent removal, sphincterotomy extension and stone removal.  -- Plan for cholecystectomy 12/14  Type 2 diabetes mellitus with hyperglycemia ([place]) -- Stable.  Continue long-acting insulin and sliding scale.  Gout -- continue colchicine  Benign essential HTN -- Continue carvedilol, losartan.  Peripheral neuropathy -- Continue gabapentin and duloxetine.  Presence of cardiac pacemaker --cleared by cardiology for surgery  Subjective:  Feels ok today, no pain, no n/v  Objective Vitals:   12/04/21 1319 12/04/21 2000 12/05/21 0401 12/05/21 1254  BP: (!) 157/88 (!) 161/69 (!) 166/88 (!) 170/81  Pulse: 72 67 79 68  Resp: 16 18 18 18   Temp: 98 F (36.7 C) 99.4 F (37.4 C) 98.7 F (37.1 C) 97.8 F (36.6 C)  TempSrc: Oral Oral Oral Oral  SpO2: 97% 96% 96% 98%  Weight:      Height:       59 kg  Vital signs were reviewed and unremarkable.  Exam Physical Exam Vitals reviewed.  Constitutional:      General: She is not in acute distress.    Appearance: She is not ill-appearing or toxic-appearing.  Cardiovascular:     Rate and Rhythm: Normal rate and regular rhythm.  Pulmonary:     Effort: No respiratory distress.     Breath sounds: No wheezing, rhonchi or rales.  Neurological:     Mental  Status: She is alert.  Psychiatric:        Mood and Affect: Mood normal.        Behavior: Behavior normal.    Labs / Other Information My review of labs, imaging, notes and other tests is significant for     BMP unremarkable, AST, ALT, total bilirubin trending down.  Disposition Plan: Status is: Inpatient  Remains inpatient appropriate because: Cholecystectomy planned  Time spent: 20 minutes [facility] 12/05/2021, 3:02 PM

## 2021-12-05 NOTE — Progress Notes (Signed)
Received report. Assuming care of patient at this time.  °

## 2021-12-05 NOTE — Progress Notes (Addendum)
5 Days Post-Op  Subjective: CC: Patient reports mainly back pain but does have some epigastric soreness.  Tolerating full liquids without any nausea or vomiting.  Objective: Vital signs in last 24 hours: Temp:  [98 F (36.7 C)-99.4 F (37.4 C)] 98.7 F (37.1 C) (12/13 0401) Pulse Rate:  [67-79] 79 (12/13 0401) Resp:  [16-18] 18 (12/13 0401) BP: (157-166)/(69-88) 166/88 (12/13 0401) SpO2:  [96 %-97 %] 96 % (12/13 0401) Last BM Date: 11/29/21  Intake/Output from previous day: 12/12 0701 - 12/13 0700 In: 2226.1 [P.O.:240; I.V.:1986.1] Out: 1850 [Urine:1850] Intake/Output this shift: No intake/output data recorded.  PE: Gen:  Alert, NAD, pleasant Pulm: Rate and effort normal Abd: Soft, ND, upper abdominal tenderness without peritonitis, +BS. Prior midline incision well healed.  Psych: A&Ox4 Skin: no rashes noted, warm and dry  Lab Results:  Recent Labs    12/05/21 0804  WBC 12.6*  HGB 11.3*  HCT 34.3*  PLT 190   BMET Recent Labs    12/04/21 0448 12/05/21 0804  NA 135 135  K 3.4* 3.7  CL 98 96*  CO2 28 29  GLUCOSE 95 123*  BUN 8 8  CREATININE 0.72 0.80  CALCIUM 8.1* 8.2*   PT/INR No results for input(s): LABPROT, INR in the last 72 hours. CMP     Component Value Date/Time   NA 135 12/05/2021 0804   NA 140 02/14/2021 1017   K 3.7 12/05/2021 0804   CL 96 (L) 12/05/2021 0804   CO2 29 12/05/2021 0804   GLUCOSE 123 (H) 12/05/2021 0804   BUN 8 12/05/2021 0804   BUN 15 02/14/2021 1017   CREATININE 0.80 12/05/2021 0804   CALCIUM 8.2 (L) 12/05/2021 0804   PROT 5.2 (L) 12/05/2021 0804   PROT 6.7 02/14/2021 1017   ALBUMIN 2.7 (L) 12/05/2021 0804   ALBUMIN 4.2 02/14/2021 1017   AST 59 (H) 12/05/2021 0804   ALT 80 (H) 12/05/2021 0804   ALKPHOS 215 (H) 12/05/2021 0804   BILITOT 2.0 (H) 12/05/2021 0804   BILITOT 0.5 02/14/2021 1017   GFRNONAA >60 12/05/2021 0804   GFRAA 61 02/14/2021 1017   Lipase     Component Value Date/Time   LIPASE 22  12/02/2021 0528    Studies/Results: No results found.  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 Rush Landmark on 12/8. During the procedure filling defects consistent with stones and sludge were seen on the cholangiogram and biliary tree was swept and sludge was found; gastritis was biopsied.  Based on procedure note Dr. Rush Landmark was able to access the duodenum via the stomach as well as gain access to the efferent and afferent limbs of the gastrojejunostomy. - Cleared by Cardiology - Patient's LFT's/T. Bili rose after ERCP there is concern for continued Choledocholithiasis. Now downtrending with T. Bili of 2.0 this am from 3.2.  - Will plan for laparoscopic, possible open, cholecystectomy tomorrow. Our OR is not equipped for come bile duct exploration. Will plan for patient to follow up with GI for repeat ERCP post  op. Per chart it appears is already planned for repeat ERCP with GI on 02/01/22    FEN: FLD. NPO at midnight.  ID: Cipro. WBC 12.6, afebrile  VTE: SCDs, okay for chemical ppx from surgical perspective   Complete heart block status post pacemaker placement Hypertension Hyperlipidemia Diabetes mellitus -A1c 7.9   LOS: 6 days    Jillyn Ledger , [facility] 12/05/2021, 9:26 AM Please see Amion for pager number during day hours 7:00am-4:30pm

## 2021-12-06 ENCOUNTER — Inpatient Hospital Stay (HOSPITAL_COMMUNITY): Payer: Medicare Other | Admitting: Certified Registered Nurse Anesthetist

## 2021-12-06 ENCOUNTER — Encounter (HOSPITAL_COMMUNITY): Payer: Self-pay | Admitting: Internal Medicine

## 2021-12-06 ENCOUNTER — Encounter (HOSPITAL_COMMUNITY)
Admission: EM | Disposition: A | Discharge status: Skilled Nursing Facility | Payer: Self-pay | Source: Skilled Nursing Facility | Attending: Internal Medicine

## 2021-12-06 DIAGNOSIS — G629 Polyneuropathy, unspecified: Secondary | ICD-10-CM

## 2021-12-06 HISTORY — PX: CHOLECYSTECTOMY: SHX55

## 2021-12-06 LAB — COMPREHENSIVE METABOLIC PANEL
ALT: 64 U/L — ABNORMAL HIGH (ref 0–44)
AST: 46 U/L — ABNORMAL HIGH (ref 15–41)
Albumin: 2.8 g/dL — ABNORMAL LOW (ref 3.5–5.0)
Alkaline Phosphatase: 208 U/L — ABNORMAL HIGH (ref 38–126)
Anion gap: 11 (ref 5–15)
BUN: 5 mg/dL — ABNORMAL LOW (ref 8–23)
CO2: 29 mmol/L (ref 22–32)
Calcium: 8.7 mg/dL — ABNORMAL LOW (ref 8.9–10.3)
Chloride: 95 mmol/L — ABNORMAL LOW (ref 98–111)
Creatinine, Ser: 0.7 mg/dL (ref 0.44–1.00)
GFR, Estimated: >60 mL/min (ref >60)
Glucose, Bld: 118 mg/dL — ABNORMAL HIGH (ref 70–99)
Potassium: 3.2 mmol/L — ABNORMAL LOW (ref 3.5–5.1)
Sodium: 135 mmol/L (ref 135–145)
Total Bilirubin: 1.5 mg/dL — ABNORMAL HIGH (ref 0.3–1.2)
Total Protein: 5.6 g/dL — ABNORMAL LOW (ref 6.5–8.1)

## 2021-12-06 LAB — CBC
HCT: 36.2 % (ref 36.0–46.0)
Hemoglobin: 11.9 g/dL — ABNORMAL LOW (ref 12.0–15.0)
MCH: 30.6 pg (ref 26.0–34.0)
MCHC: 32.9 g/dL (ref 30.0–36.0)
MCV: 93.1 fL (ref 80.0–100.0)
Platelets: 229 10*3/uL (ref 150–400)
RBC: 3.89 MIL/uL (ref 3.87–5.11)
RDW: 14.6 % (ref 11.5–15.5)
WBC: 11.4 10*3/uL — ABNORMAL HIGH (ref 4.0–10.5)
nRBC: 0 % (ref 0.0–0.2)

## 2021-12-06 LAB — GLUCOSE, CAPILLARY
Glucose-Capillary: 106 mg/dL — ABNORMAL HIGH (ref 70–99)
Glucose-Capillary: 118 mg/dL — ABNORMAL HIGH (ref 70–99)
Glucose-Capillary: 164 mg/dL — ABNORMAL HIGH (ref 70–99)
Glucose-Capillary: 188 mg/dL — ABNORMAL HIGH (ref 70–99)
Glucose-Capillary: 192 mg/dL — ABNORMAL HIGH (ref 70–99)
Glucose-Capillary: 203 mg/dL — ABNORMAL HIGH (ref 70–99)
Glucose-Capillary: 91 mg/dL (ref 70–99)

## 2021-12-06 SURGERY — LAPAROSCOPIC CHOLECYSTECTOMY
Anesthesia: General | Site: Abdomen

## 2021-12-06 MED ORDER — BUPIVACAINE-EPINEPHRINE (PF) 0.25% -1:200000 IJ SOLN
INTRAMUSCULAR | Status: AC
  Filled 2021-12-06: qty 30

## 2021-12-06 MED ORDER — CEFAZOLIN SODIUM-DEXTROSE 2-4 GM/100ML-% IV SOLN
INTRAVENOUS | Status: AC
  Filled 2021-12-06: qty 100

## 2021-12-06 MED ORDER — CHLORHEXIDINE GLUCONATE 0.12 % MT SOLN
15.0000 mL | Freq: Once | OROMUCOSAL | Status: AC
  Administered 2021-12-06: 12:00:00 15 mL via OROMUCOSAL

## 2021-12-06 MED ORDER — PROPOFOL 10 MG/ML IV BOLUS
INTRAVENOUS | Status: DC | PRN
  Administered 2021-12-06: 30 mg via INTRAVENOUS
  Administered 2021-12-06: 120 mg via INTRAVENOUS

## 2021-12-06 MED ORDER — PHENYLEPHRINE 40 MCG/ML (10ML) SYRINGE FOR IV PUSH (FOR BLOOD PRESSURE SUPPORT)
PREFILLED_SYRINGE | INTRAVENOUS | Status: DC | PRN
  Administered 2021-12-06 (×2): 120 ug via INTRAVENOUS

## 2021-12-06 MED ORDER — RINGERS IRRIGATION IR SOLN
Status: DC | PRN
  Administered 2021-12-06: 1

## 2021-12-06 MED ORDER — DEXAMETHASONE SODIUM PHOSPHATE 10 MG/ML IJ SOLN
INTRAMUSCULAR | Status: AC
  Filled 2021-12-06: qty 1

## 2021-12-06 MED ORDER — MORPHINE SULFATE (PF) 2 MG/ML IV SOLN
2.0000 mg | INTRAVENOUS | Status: DC | PRN
  Administered 2021-12-06: 20:00:00 2 mg via INTRAVENOUS
  Filled 2021-12-06: qty 1

## 2021-12-06 MED ORDER — PHENYLEPHRINE 40 MCG/ML (10ML) SYRINGE FOR IV PUSH (FOR BLOOD PRESSURE SUPPORT)
PREFILLED_SYRINGE | INTRAVENOUS | Status: AC
  Filled 2021-12-06: qty 10

## 2021-12-06 MED ORDER — LABETALOL HCL 5 MG/ML IV SOLN
INTRAVENOUS | Status: AC
  Filled 2021-12-06: qty 4

## 2021-12-06 MED ORDER — FENTANYL CITRATE (PF) 100 MCG/2ML IJ SOLN
INTRAMUSCULAR | Status: AC
  Filled 2021-12-06: qty 2

## 2021-12-06 MED ORDER — ONDANSETRON HCL 4 MG/2ML IJ SOLN
INTRAMUSCULAR | Status: AC
  Filled 2021-12-06: qty 2

## 2021-12-06 MED ORDER — DEXAMETHASONE SODIUM PHOSPHATE 4 MG/ML IJ SOLN
INTRAMUSCULAR | Status: DC | PRN
  Administered 2021-12-06: 5 mg via INTRAVENOUS

## 2021-12-06 MED ORDER — LACTATED RINGERS IV SOLN: INTRAVENOUS | Status: DC

## 2021-12-06 MED ORDER — ROCURONIUM BROMIDE 10 MG/ML (PF) SYRINGE
PREFILLED_SYRINGE | INTRAVENOUS | Status: DC | PRN
  Administered 2021-12-06: 10 mg via INTRAVENOUS
  Administered 2021-12-06: 60 mg via INTRAVENOUS
  Administered 2021-12-06: 10 mg via INTRAVENOUS

## 2021-12-06 MED ORDER — TRAMADOL HCL 50 MG PO TABS
50.0000 mg | ORAL_TABLET | Freq: Four times a day (QID) | ORAL | Status: DC | PRN
  Administered 2021-12-06 – 2021-12-11 (×7): 50 mg via ORAL
  Filled 2021-12-06 (×6): qty 1

## 2021-12-06 MED ORDER — ONDANSETRON HCL 4 MG/2ML IJ SOLN: 4.0000 mg | Freq: Once | INTRAMUSCULAR | Status: DC | PRN

## 2021-12-06 MED ORDER — LIDOCAINE 5 % EX PTCH
1.0000 | MEDICATED_PATCH | CUTANEOUS | Status: DC
  Administered 2021-12-06 – 2021-12-10 (×5): 1 via TRANSDERMAL
  Filled 2021-12-06 (×6): qty 1

## 2021-12-06 MED ORDER — TRAMADOL HCL 50 MG PO TABS
ORAL_TABLET | ORAL | Status: AC
  Filled 2021-12-06: qty 1

## 2021-12-06 MED ORDER — ROCURONIUM BROMIDE 10 MG/ML (PF) SYRINGE
PREFILLED_SYRINGE | INTRAVENOUS | Status: AC
  Filled 2021-12-06: qty 10

## 2021-12-06 MED ORDER — LIDOCAINE 2% (20 MG/ML) 5 ML SYRINGE
INTRAMUSCULAR | Status: DC | PRN
  Administered 2021-12-06: 50 mg via INTRAVENOUS

## 2021-12-06 MED ORDER — PROPOFOL 10 MG/ML IV BOLUS
INTRAVENOUS | Status: AC
  Filled 2021-12-06: qty 20

## 2021-12-06 MED ORDER — BUPIVACAINE-EPINEPHRINE 0.25% -1:200000 IJ SOLN
INTRAMUSCULAR | Status: DC | PRN
  Administered 2021-12-06: 10 mL

## 2021-12-06 MED ORDER — LABETALOL HCL 5 MG/ML IV SOLN
INTRAVENOUS | Status: DC | PRN
  Administered 2021-12-06 (×2): 5 mg via INTRAVENOUS

## 2021-12-06 MED ORDER — FENTANYL CITRATE PF 50 MCG/ML IJ SOSY: 25.0000 ug | PREFILLED_SYRINGE | INTRAMUSCULAR | Status: DC | PRN

## 2021-12-06 MED ORDER — ONDANSETRON HCL 4 MG/2ML IJ SOLN
INTRAMUSCULAR | Status: DC | PRN
  Administered 2021-12-06: 4 mg via INTRAVENOUS

## 2021-12-06 MED ORDER — CEFAZOLIN SODIUM-DEXTROSE 2-3 GM-%(50ML) IV SOLR
INTRAVENOUS | Status: DC | PRN
  Administered 2021-12-06: 2 g via INTRAVENOUS

## 2021-12-06 MED ORDER — SODIUM CHLORIDE 0.9 % IV SOLN
2.0000 g | INTRAVENOUS | Status: DC
  Administered 2021-12-06 – 2021-12-10 (×5): 2 g via INTRAVENOUS
  Filled 2021-12-06 (×6): qty 20

## 2021-12-06 MED ORDER — FENTANYL CITRATE (PF) 100 MCG/2ML IJ SOLN
INTRAMUSCULAR | Status: DC | PRN
  Administered 2021-12-06: 50 ug via INTRAVENOUS
  Administered 2021-12-06 (×2): 25 ug via INTRAVENOUS
  Administered 2021-12-06 (×2): 50 ug via INTRAVENOUS

## 2021-12-06 SURGICAL SUPPLY — 49 items
APPLIER CLIP ROT 10 11.4 M/L (STAPLE) ×3
BAG COUNTER SPONGE SURGICOUNT (BAG) ×1 IMPLANT
BAG SURGICOUNT SPONGE COUNTING (BAG) ×1
BIOPATCH WHT 1IN DISK W/4.0 H (GAUZE/BANDAGES/DRESSINGS) ×2 IMPLANT
CABLE HIGH FREQUENCY MONO STRZ (ELECTRODE) ×2 IMPLANT
CLIP APPLIE ROT 10 11.4 M/L (STAPLE) ×1 IMPLANT
COVER MAYO STAND STRL (DRAPES) ×3 IMPLANT
CUTTER FLEX LINEAR 45M (STAPLE) ×2 IMPLANT
DECANTER SPIKE VIAL GLASS SM (MISCELLANEOUS) ×1 IMPLANT
DERMABOND ADVANCED (GAUZE/BANDAGES/DRESSINGS) ×2
DERMABOND ADVANCED .7 DNX12 (GAUZE/BANDAGES/DRESSINGS) IMPLANT
DRAIN CHANNEL 19F RND (DRAIN) ×2 IMPLANT
DRAPE C-ARM 42X120 X-RAY (DRAPES) IMPLANT
DRAPE UTILITY XL STRL (DRAPES) ×3 IMPLANT
DRAPE WARM FLUID 44X44 (DRAPES) ×3 IMPLANT
DRSG TEGADERM 4X4.75 (GAUZE/BANDAGES/DRESSINGS) ×2 IMPLANT
ELECT REM PT RETURN 15FT ADLT (MISCELLANEOUS) ×3 IMPLANT
EVACUATOR SILICONE 100CC (DRAIN) ×2 IMPLANT
GLOVE SURG MICRO LTX SZ7.5 (GLOVE) ×3 IMPLANT
GLOVE SURG UNDER LTX SZ8 (GLOVE) ×3 IMPLANT
GOWN STRL REUS W/TWL XL LVL3 (GOWN DISPOSABLE) ×6 IMPLANT
HEMOSTAT SNOW SURGICEL 2X4 (HEMOSTASIS) ×2 IMPLANT
HEMOSTAT SURGICEL 4X8 (HEMOSTASIS) IMPLANT
IRRIG SUCT STRYKERFLOW 2 WTIP (MISCELLANEOUS) ×3
IRRIGATION SUCT STRKRFLW 2 WTP (MISCELLANEOUS) ×1 IMPLANT
KIT BASIN OR (CUSTOM PROCEDURE TRAY) ×3 IMPLANT
KIT TURNOVER KIT A (KITS) IMPLANT
PAD POSITIONING PINK XL (MISCELLANEOUS) IMPLANT
PENCIL SMOKE EVACUATOR (MISCELLANEOUS) IMPLANT
POUCH SPECIMEN RETRIEVAL 10MM (ENDOMECHANICALS) ×3 IMPLANT
PROTECTOR NERVE ULNAR (MISCELLANEOUS) IMPLANT
RELOAD STAPLE 45 3.5 BLU ETS (ENDOMECHANICALS) IMPLANT
RELOAD STAPLE TA45 3.5 REG BLU (ENDOMECHANICALS) ×3 IMPLANT
SCISSORS LAP 5X35 DISP (ENDOMECHANICALS) ×3 IMPLANT
SET CHOLANGIOGRAPH MIX (MISCELLANEOUS) ×3 IMPLANT
SET TUBE SMOKE EVAC HIGH FLOW (TUBING) IMPLANT
SLEEVE ENDOPATH XCEL 5M (ENDOMECHANICALS) ×2 IMPLANT
SLEEVE XCEL OPT CAN 5 100 (ENDOMECHANICALS) ×3 IMPLANT
SUT ETHILON 2 0 PS N (SUTURE) ×2 IMPLANT
SUT MNCRL AB 4-0 PS2 18 (SUTURE) ×3 IMPLANT
SUT VIC AB 4-0 PS2 27 (SUTURE) ×2 IMPLANT
TAPE CLOTH 4X10 WHT NS (GAUZE/BANDAGES/DRESSINGS) IMPLANT
TOWEL OR 17X26 10 PK STRL BLUE (TOWEL DISPOSABLE) ×3 IMPLANT
TOWEL OR NON WOVEN STRL DISP B (DISPOSABLE) ×3 IMPLANT
TRAY LAPAROSCOPIC (CUSTOM PROCEDURE TRAY) ×3 IMPLANT
TROCAR BLADELESS OPT 5 100 (ENDOMECHANICALS) ×3 IMPLANT
TROCAR XCEL 12X100 BLDLESS (ENDOMECHANICALS) ×2 IMPLANT
TROCAR XCEL BLUNT TIP 100MML (ENDOMECHANICALS) ×3 IMPLANT
TROCAR XCEL NON-BLD 11X100MML (ENDOMECHANICALS) IMPLANT

## 2021-12-06 NOTE — Transfer of Care (Signed)
Immediate Anesthesia Transfer of Care Note  Patient: Judith [name]  Procedure(s) Performed: LAPAROSCOPIC CHOLECYSTECTOMY, LYSIS OF ADHESIONS (Abdomen)  Patient Location: PACU  Anesthesia Type:General  Level of Consciousness: drowsy and patient cooperative  Airway & Oxygen Therapy: Patient Spontanous Breathing and Patient connected to face mask oxygen  Post-op Assessment: Report given to RN and Post -op Vital signs reviewed and stable  Post vital signs: Reviewed and stable  Last Vitals:  Vitals Value Taken Time  BP 184/144 12/06/21 1501  Temp 37.3 C 12/06/21 1501  Pulse 60 12/06/21 1507  Resp 10 12/06/21 1507  SpO2 100 % 12/06/21 1507  Vitals shown include unvalidated device data.  Last Pain:  Vitals:   12/06/21 1138  TempSrc:   PainSc: 0-No pain      Patients Stated Pain Goal: 3 (56/15/37 9432)  Complications: No notable events documented.

## 2021-12-06 NOTE — Op Note (Signed)
Preoperative diagnosis: Acute on chronic cholecystitis  Postoperative diagnosis gangrenous cholecystitis  Procedure: Laparoscopic subtotal cholecystectomy  Surgeon: Erroll Luna, MD  Assistant: Melina Modena PA  Anesthesia: General with 0.25% Marcaine plain  EBL: 60 cc  Drains 19 round drain the gallbladder fossa  IV fluids: Per anesthesia record  Indications for procedure: The patient is a 80 year old female admitted for choledocholithiasis of the weekend.  Of note she had a Billroth II reconstruction many years ago for peptic ulcer disease.  She underwent stenting of her common duct.  She still has significant cholecystitis we discussed cholecystectomy with her today.  We discussed increased risk of opening as well as the increased complication rate due to her advanced age and multiple comorbidities.  We discussed the previous surgery and the impact that may have on her cholecystectomy.  After lengthy discussion the patient and her daughter, they wish to proceed with an attempted cholecystectomy due to his continued pain.The procedure has been discussed with the patient. Operative and non operative treatments have been discussed. Risks of surgery include bleeding, infection,  Common bile duct injury,  Injury to the stomach,liver, colon,small intestine, abdominal wall,  Diaphragm,  Major blood vessels,  And the need for an open procedure.  Other risks include worsening of medical problems, death,  DVT and pulmonary embolism, and cardiovascular events.   Medical options have also been discussed. The patient has been informed of long term expectations of surgery and non surgical options,  The patient agrees to proceed.          Description of procedure: The patient was met in the holding area and questions were answered.  The procedure was reviewed with her.  She was taken back to the operative room.  She was placed supine upon the OR table.  After induction of general endotracheal anesthesia,  the abdomen was prepped and draped in sterile fashion timeout performed.  Proper patient, site and procedure verified.  She received appropriate preoperative antibiotics.  A 5 mm Optiview port was placed in the patient's right lower quadrant under direct vision.  We entered the abdominal cavity through abdominal wall layers and direct vision with no evidence of bowel injury.  Insufflation was then begun.  She had multiple midline adhesions.  I placed a second rubber quadrant port under direct vision.  Then placed an additional left lower quadrant port and initial 5 mm port in the epigastrium.  With these ports is able to carefully take the omentum down to the anterior abdominal wall.  The liver was noted.  The gallbladder was covered both transverse colon.  There is significant inflammation of the gallbladder.  I was able to dissect the colon off of the gallbladder and identify the dome.  This was somewhat gangrenous.  With these ports is able to grab the dome and retracted to the patient's right shoulder.  The gallbladder is extremely friable and ripped quite easily.  I then mobilized the gallbladder off the liver bed initially to provide better mobility.  Due to the inflammation I could not clearly visit visualize the cystic duct.  I was able to dissect most of the gallbladder off the liver edge.  Once I got down the infundibulum due to the dense adhesions and inflammation, I did not feel it was safe to proceed to try to dissect out the remainder of the infundibulum and cystic duct remnant.  The epigastric port was upsized to 12 mm.  Through that I placed a GIA 45 laparoscopic stapler.  This  was fired across the infundibulum to resect the remainder the gallbladder.  This was removed via an Endo Catch bag.  Inspection showed good hemostasis and closure of the infundibulum.  There is very little gallbladder left since this was right above the cystic duct.  We were away from the common duct.  Of note she did have a  common duct stent.  Gallbladder bed was made hemostatic.  Any oozing was controlled with cautery or small clips.  There is no evidence of bile leakage.  Copious irrigation was used until clear.  Four-quadrant laparoscopy was performed and I did did not see any evidence of colonic or bowel injury.  A drain was placed to the right upper quadrant port site.  This was secured to skin with 2-0 nylon.  It was placed in gallbladder bed.  Surgicel snow was placed in the gallbladder fossa.  All ports were then removed.  The upsized port fascia was closed with 0 Vicryl.  4 Monocryl used to close all skin incisions.  All counts found to be correct.  The patient was awoke extubated taken to recovery in satisfactory condition.

## 2021-12-06 NOTE — Anesthesia Preprocedure Evaluation (Addendum)
Anesthesia Evaluation  Patient identified by MRN, date of birth, ID band Patient awake    Reviewed: Allergy & Precautions, NPO status , Patient's Chart, lab work & pertinent test results, reviewed documented beta blocker date and time   History of Anesthesia Complications (+) DIFFICULT AIRWAY  Airway Mallampati: III  TM Distance: >3 FB Neck ROM: Limited    Dental  (+) Dental Advisory Given   Pulmonary sleep apnea and Continuous Positive Airway Pressure Ventilation ,    Pulmonary exam normal breath sounds clear to auscultation       Cardiovascular hypertension, Pt. on medications Normal cardiovascular exam+ pacemaker + Valvular Problems/Murmurs  Rhythm:Regular Rate:Normal  EKG 12/01/2021 Atrial sensed, ventricular paced rhythm   Neuro/Psych PSYCHIATRIC DISORDERS Depression Peripheral neuropathy  Neuromuscular disease    GI/Hepatic PUD, GERD  Medicated and Controlled,Elevated LFT's Choledocholithiasis   Endo/Other  diabetes, Well Controlled, Type 2, Insulin Dependent  Renal/GU negative Renal ROS  negative genitourinary   Musculoskeletal  (+) Arthritis , Osteoarthritis,  C-spine OA   Abdominal   Peds  Hematology  (+) anemia ,   Anesthesia Other Findings   Reproductive/Obstetrics                            Anesthesia Physical Anesthesia Plan  ASA: 3  Anesthesia Plan: General   Post-op Pain Management:    Induction: Intravenous, Rapid sequence and Cricoid pressure planned  PONV Risk Score and Plan: 4 or greater and Treatment may vary due to age or medical condition and Ondansetron  Airway Management Planned: Oral ETT and Video Laryngoscope Planned  Additional Equipment:   Intra-op Plan:   Post-operative Plan: Extubation in OR  Informed Consent: I have reviewed the patients History and Physical, chart, labs and discussed the procedure including the risks, benefits and alternatives  for the proposed anesthesia with the patient or authorized representative who has indicated his/her understanding and acceptance.     Dental advisory given  Plan Discussed with: CRNA and Anesthesiologist  Anesthesia Plan Comments:        Anesthesia Quick Evaluation

## 2021-12-06 NOTE — Progress Notes (Addendum)
PROGRESS NOTE  Judith [name] RDE:[id number] DOB: 1941-07-25 DOA: 11/28/2021 PCP: Janie Morning, DO   LOS: 7 days   Brief narrative:  Patient is 80 years old female with past medical history of diabetes, hypertension, peripheral neuropathy, gout presented to hospital with abdominal pain nausea and vomiting.  In the ED, patient had a CT scan which showed cholelithiasis and choledocholithiasis.  GI was consulted and patient underwent ERCP on 11/30/2021.  Patient did have extremely difficult ERCP.  General surgery was consulted and the plan is surgical intervention on 12/06/2021.  Assessment/Plan:  Principal Problem:   Choledocholithiasis Active Problems:   Peripheral neuropathy   Benign essential HTN   Presence of cardiac pacemaker   Gout   Type 2 diabetes mellitus with hyperglycemia (HCC)   Choledocholithiasis. Patient underwent difficult ERCP followed by biliary stent placement on 11/30/2021.  Plan is to follow-up with GI in 6 to 10 weeks for stent removal sphincterectomy extension and stone removal.  Plan for definitive cholecystectomy 12/06/2021.  Type 2 diabetes mellitus with hyperglycemia. Sliding scale insulin and long-acting insulin.  Continue to monitor blood glucose levels  History of gout. Continue colchicine.  Benign essential hypertension. Continue Coreg and losartan.  Will monitor blood pressure.  Peripheral neuropathy. Continue gabapentin and duloxetine  Mild hypokalemia.  Will need replacement after surgery.  Disposition.  Patient is from [facility].  We will get PT evaluation after surgery.  Might need higher level of care   DVT prophylaxis: Place and maintain sequential compression device Start: 12/03/21 1840 SCDs Start: 11/28/21 1643   Code Status: Full code  Family Communication: Spoke with the patient's daughter at bedside.  Status is: Inpatient  Remains inpatient appropriate because: Need for  cholecystectomy   Consultants: GI General surgery  Procedures: ERCP with stent placement on 11/30/2021  Anti-infectives:  None  Anti-infectives (From admission, onward)    Start     Dose/Rate Route Frequency Ordered Stop   12/05/21 1030  ciprofloxacin (CIPRO) IVPB 400 mg  Status:  Discontinued        400 mg 200 mL/hr over 60 Minutes Intravenous 2 times daily 12/05/21 0935 12/05/21 1505   11/30/21 1415  ciprofloxacin (CIPRO) IVPB 400 mg        400 mg 200 mL/hr over 60 Minutes Intravenous  Once 11/30/21 1402 11/30/21 1501        Subjective: Today, patient was seen and examined at bedside.  Patient complains of generalized weakness and fatigue but no nausea or vomiting.  Awaiting for surgical intervention.  Patient's daughter at bedside.  Objective: Vitals:   12/05/21 2216 12/06/21 0543  BP: (!) 164/73   Pulse: 60 61  Resp: 18 16  Temp: 98.8 F (37.1 C) 97.9 F (36.6 C)  SpO2: 95% 92%    Intake/Output Summary (Last 24 hours) at 12/06/2021 0845 Last data filed at 12/06/2021 0827 Gross per 24 hour  Intake 1249.59 ml  Output 500 ml  Net 749.59 ml   Filed Weights   11/28/21 1348  Weight: 59 kg   Body mass index is 23.03 kg/m.   Physical Exam:  GENERAL: Patient is alert awake and oriented. Not in obvious distress.  Mildly anxious HENT: No scleral pallor or icterus. Pupils equally reactive to light. Oral mucosa is moist NECK: is supple, no gross swelling noted. CHEST: Clear to auscultation. No crackles or wheezes.  Diminished breath sounds bilaterally. CVS: S1 and S2 heard, no murmur. Regular rate and rhythm.  ABDOMEN: Soft, tenderness of the right upper  quadrant on deep palpation.  Bowel sounds are present. EXTREMITIES: No edema. CNS: Cranial nerves are intact. No focal motor deficits. SKIN: warm and dry without rashes.  Data Review: I have personally reviewed the following laboratory data and studies,  CBC: Recent Labs  Lab 11/30/21 0458  12/01/21 0539 12/02/21 0528 12/05/21 0804 12/06/21 0448  WBC 15.2* 14.5* 11.1* 12.6* 11.4*  HGB 11.9* 12.5 10.4* 11.3* 11.9*  HCT 36.8 38.4 31.9* 34.3* 36.2  MCV 95.8 94.3 93.8 92.7 93.1  PLT 192 178 180 190 616   Basic Metabolic Panel: Recent Labs  Lab 12/02/21 0528 12/03/21 0530 12/04/21 0448 12/05/21 0804 12/06/21 0448  NA 133* 134* 135 135 135  K 3.6 3.7 3.4* 3.7 3.2*  CL 97* 97* 98 96* 95*  CO2 28 28 28 29 29   GLUCOSE 203* 145* 95 123* 118*  BUN 15 15 8 8  5*  CREATININE 0.82 0.88 0.72 0.80 0.70  CALCIUM 8.0* 8.2* 8.1* 8.2* 8.7*   Liver Function Tests: Recent Labs  Lab 12/02/21 0528 12/03/21 0530 12/04/21 0448 12/05/21 0804 12/06/21 0448  AST 323* 573* 173* 59* 46*  ALT 104* 223* 132* 80* 64*  ALKPHOS 188* 312* 243* 215* 208*  BILITOT 0.9 3.3* 3.2* 2.0* 1.5*  PROT 5.4* 5.6* 5.6* 5.2* 5.6*  ALBUMIN 3.0* 3.1* 3.0* 2.7* 2.8*   Recent Labs  Lab 12/02/21 0528  LIPASE 22   No results for input(s): AMMONIA in the last 168 hours. Cardiac Enzymes: No results for input(s): CKTOTAL, CKMB, CKMBINDEX, TROPONINI in the last 168 hours. BNP (last 3 results) No results for input(s): BNP in the last 8760 hours.  ProBNP (last 3 results) No results for input(s): PROBNP in the last 8760 hours.  CBG: Recent Labs  Lab 12/05/21 1652 12/05/21 2131 12/06/21 0035 12/06/21 0128 12/06/21 0720  GLUCAP 142* 119* 91 164* 106*   Recent Results (from the past 240 hour(s))  Resp Panel by RT-PCR (Flu A&B, Covid) Nasopharyngeal Swab     Status: None   Collection Time: 12/03/21  9:32 AM   Specimen: Nasopharyngeal Swab; Nasopharyngeal(NP) swabs in vial transport medium  Result Value Ref Range Status   SARS Coronavirus 2 by RT PCR NEGATIVE NEGATIVE Final    Comment: (NOTE) SARS-CoV-2 target nucleic acids are NOT DETECTED.  The SARS-CoV-2 RNA is generally detectable in upper respiratory specimens during the acute phase of infection. The lowest concentration of SARS-CoV-2  viral copies this assay can detect is 138 copies/mL. A negative result does not preclude SARS-Cov-2 infection and should not be used as the sole basis for treatment or other patient management decisions. A negative result may occur with  improper specimen collection/handling, submission of specimen other than nasopharyngeal swab, presence of viral mutation(s) within the areas targeted by this assay, and inadequate number of viral copies(<138 copies/mL). A negative result must be combined with clinical observations, patient history, and epidemiological information. The expected result is Negative.  Fact Sheet for Patients:  EntrepreneurPulse.com.au  Fact Sheet for Healthcare Providers:  IncredibleEmployment.be  This test is no t yet approved or cleared by the [place] FDA and  has been authorized for detection and/or diagnosis of SARS-CoV-2 by FDA under an Emergency Use Authorization (EUA). This EUA will remain  in effect (meaning this test can be used) for the duration of the COVID-19 declaration under Section 564(b)(1) of the Act, 21 U.S.C.section 360bbb-3(b)(1), unless the authorization is terminated  or revoked sooner.       Influenza A by PCR NEGATIVE NEGATIVE  Final   Influenza B by PCR NEGATIVE NEGATIVE Final    Comment: (NOTE) The Xpert Xpress SARS-CoV-2/FLU/RSV plus assay is intended as an aid in the diagnosis of influenza from Nasopharyngeal swab specimens and should not be used as a sole basis for treatment. Nasal washings and aspirates are unacceptable for Xpert Xpress SARS-CoV-2/FLU/RSV testing.  Fact Sheet for Patients: EntrepreneurPulse.com.au  Fact Sheet for Healthcare Providers: IncredibleEmployment.be  This test is not yet approved or cleared by the [place] FDA and has been authorized for detection and/or diagnosis of SARS-CoV-2 by FDA under an Emergency Use Authorization  (EUA). This EUA will remain in effect (meaning this test can be used) for the duration of the COVID-19 declaration under Section 564(b)(1) of the Act, 21 U.S.C. section 360bbb-3(b)(1), unless the authorization is terminated or revoked.  Performed at [facility], [place] [address], [place], [place] [address]      Studies: No results found.    Flora Lipps, MD  [facility] 12/06/2021  If 7PM-7AM, please contact night-coverage

## 2021-12-06 NOTE — Progress Notes (Signed)
Received report from [facility], RN. Assuming care of patient at this time.

## 2021-12-06 NOTE — Progress Notes (Signed)
Received patient from PACU back into 1418. Patient drowsy but alert and oriented. BP 174/65 but vital signs stable. Will continue to monitor.

## 2021-12-06 NOTE — Progress Notes (Signed)
Received report from floor RN for surgery today.

## 2021-12-06 NOTE — Progress Notes (Signed)
6 Days Post-Op   Subjective/Chief Complaint: rash Had a reaction to IV cipro Taken it numerous times with no issues   No questions about surgery LFT improving    Objective: Vital signs in last 24 hours: Temp:  [97.8 F (36.6 C)-98.8 F (37.1 C)] 97.9 F (36.6 C) (12/14 0543) Pulse Rate:  [60-68] 61 (12/14 0543) Resp:  [16-18] 16 (12/14 0543) BP: (164-170)/(73-81) 164/73 (12/13 2216) SpO2:  [92 %-98 %] 92 % (12/14 0543) Last BM Date: 12/04/21  Intake/Output from previous day: 12/13 0701 - 12/14 0700 In: 1249.6 [I.V.:1249.6] Out: -  Intake/Output this shift: Total I/O In: -  Out: 500 [Urine:500]   Gen:  Alert, NAD, pleasant Pulm: Rate and effort normal Abd: Soft, ND, upper abdominal tenderness without peritonitis, +BS. Prior midline incision well healed.  Psych: A&Ox4   Lab Results:  Recent Labs    12/05/21 0804 12/06/21 0448  WBC 12.6* 11.4*  HGB 11.3* 11.9*  HCT 34.3* 36.2  PLT 190 229   BMET Recent Labs    12/05/21 0804 12/06/21 0448  NA 135 135  K 3.7 3.2*  CL 96* 95*  CO2 29 29  GLUCOSE 123* 118*  BUN 8 5*  CREATININE 0.80 0.70  CALCIUM 8.2* 8.7*   PT/INR No results for input(s): LABPROT, INR in the last 72 hours. ABG No results for input(s): PHART, HCO3 in the last 72 hours.  Invalid input(s): PCO2, PO2  Studies/Results: No results found.  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 Rush Landmark on 12/8. During the procedure filling defects consistent with stones and sludge were seen on the cholangiogram and biliary tree was swept and sludge was found; gastritis was biopsied.  Based on procedure note Dr. Rush Landmark was able to access the duodenum via the stomach as well as gain access to the efferent and afferent limbs of the gastrojejunostomy. - Cleared by Cardiology - Patient's LFT's/T. Bili rose after ERCP there is concern for continued Choledocholithiasis. Now downtrending with T. Bili of 2.0 this am from 3.2.  -  laparoscopic, possible open, cholecystectomy    The procedure has been discussed with the patient. Operative and non operative treatments have been discussed. Risks of surgery include bleeding, infection,  Common bile duct injury,  Injury to the stomach,liver, colon,small intestine, abdominal wall,  Diaphragm,  Major blood vessels,  And the need for an open procedure.  Other risks include worsening of medical problems, death,  DVT and pulmonary embolism, and cardiovascular events.   Medical options have also been discussed. The patient has been informed of long term expectations of surgery and non surgical options,  The patient agrees to proceed.     LFT improvement so will not need CBD exploration Will plan for patient to follow up with GI for repeat ERCP post op. Per chart it appears  is already planned for repeat ERCP with GI on 02/01/22    FEN: FLD. NPO at midnight.  ID: Cipro. WBC 12.6, afebrile  VTE: SCDs, okay for chemical ppx from surgical perspective   Complete heart block status post pacemaker placement Hypertension Hyperlipidemia Diabetes mellitus -A1c 7.9   LOS: 7 days    Judith [name] ,MD  12/06/2021

## 2021-12-06 NOTE — Anesthesia Postprocedure Evaluation (Signed)
Anesthesia Post Note  Patient: Judith [name]  Procedure(s) Performed: LAPAROSCOPIC CHOLECYSTECTOMY, LYSIS OF ADHESIONS (Abdomen)     Patient location during evaluation: PACU Anesthesia Type: General Level of consciousness: awake and alert and oriented Pain management: pain level controlled Vital Signs Assessment: post-procedure vital signs reviewed and stable Respiratory status: spontaneous breathing, nonlabored ventilation and respiratory function stable Cardiovascular status: blood pressure returned to baseline and stable Postop Assessment: no apparent nausea or vomiting Anesthetic complications: no   No notable events documented.  Last Vitals:  Vitals:   12/06/21 1530 12/06/21 1606  BP: (!) 183/72 (!) 174/65  Pulse: 70 64  Resp: 14 14  Temp:  36.5 C  SpO2: 95% 96%    Last Pain:  Vitals:   12/06/21 1606  TempSrc: Oral  PainSc:                  Alejandra Hunt A.

## 2021-12-06 NOTE — H&P (View-Only) (Signed)
6 Days Post-Op   Subjective/Chief Complaint: rash Had a reaction to IV cipro Taken it numerous times with no issues   No questions about surgery LFT improving    Objective: Vital signs in last 24 hours: Temp:  [97.8 F (36.6 C)-98.8 F (37.1 C)] 97.9 F (36.6 C) (12/14 0543) Pulse Rate:  [60-68] 61 (12/14 0543) Resp:  [16-18] 16 (12/14 0543) BP: (164-170)/(73-81) 164/73 (12/13 2216) SpO2:  [92 %-98 %] 92 % (12/14 0543) Last BM Date: 12/04/21  Intake/Output from previous day: 12/13 0701 - 12/14 0700 In: 1249.6 [I.V.:1249.6] Out: -  Intake/Output this shift: Total I/O In: -  Out: 500 [Urine:500]   Gen:  Alert, NAD, pleasant Pulm: Rate and effort normal Abd: Soft, ND, upper abdominal tenderness without peritonitis, +BS. Prior midline incision well healed.  Psych: A&Ox4   Lab Results:  Recent Labs    12/05/21 0804 12/06/21 0448  WBC 12.6* 11.4*  HGB 11.3* 11.9*  HCT 34.3* 36.2  PLT 190 229   BMET Recent Labs    12/05/21 0804 12/06/21 0448  NA 135 135  K 3.7 3.2*  CL 96* 95*  CO2 29 29  GLUCOSE 123* 118*  BUN 8 5*  CREATININE 0.80 0.70  CALCIUM 8.2* 8.7*   PT/INR No results for input(s): LABPROT, INR in the last 72 hours. ABG No results for input(s): PHART, HCO3 in the last 72 hours.  Invalid input(s): PCO2, PO2  Studies/Results: No results found.  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 Rush Landmark on 12/8. During the procedure filling defects consistent with stones and sludge were seen on the cholangiogram and biliary tree was swept and sludge was found; gastritis was biopsied.  Based on procedure note Dr. Rush Landmark was able to access the duodenum via the stomach as well as gain access to the efferent and afferent limbs of the gastrojejunostomy. - Cleared by Cardiology - Patient's LFT's/T. Bili rose after ERCP there is concern for continued Choledocholithiasis. Now downtrending with T. Bili of 2.0 this am from 3.2.  -  laparoscopic, possible open, cholecystectomy    The procedure has been discussed with the patient. Operative and non operative treatments have been discussed. Risks of surgery include bleeding, infection,  Common bile duct injury,  Injury to the stomach,liver, colon,small intestine, abdominal wall,  Diaphragm,  Major blood vessels,  And the need for an open procedure.  Other risks include worsening of medical problems, death,  DVT and pulmonary embolism, and cardiovascular events.   Medical options have also been discussed. The patient has been informed of long term expectations of surgery and non surgical options,  The patient agrees to proceed.     LFT improvement so will not need CBD exploration Will plan for patient to follow up with GI for repeat ERCP post op. Per chart it appears  is already planned for repeat ERCP with GI on 02/01/22    FEN: FLD. NPO at midnight.  ID: Cipro. WBC 12.6, afebrile  VTE: SCDs, okay for chemical ppx from surgical perspective   Complete heart block status post pacemaker placement Hypertension Hyperlipidemia Diabetes mellitus -A1c 7.9   LOS: 7 days    Judith [name] ,MD  12/06/2021

## 2021-12-06 NOTE — Interval H&P Note (Signed)
History and Physical Interval Note:  12/06/2021 9:17 AM  Judith [name]  has presented today for surgery, with the diagnosis of choledocholithiasis.  The various methods of treatment have been discussed with the patient and family. After consideration of risks, benefits and other options for treatment, the patient has consented to  Procedure(s): LAPAROSCOPIC possible open CHOLECYSTECTOMY (N/A) as a surgical intervention.  The patient's history has been reviewed, patient examined, no change in status, stable for surgery.  I have reviewed the patient's chart and labs.  Questions were answered to the patient's satisfaction.     [place]

## 2021-12-06 NOTE — Anesthesia Procedure Notes (Signed)
Procedure Name: Intubation Date/Time: 12/06/2021 1:08 PM Performed by: Claudia Desanctis, CRNA Pre-anesthesia Checklist: Emergency Drugs available, Suction available, Patient identified and Patient being monitored Patient Re-evaluated:Patient Re-evaluated prior to induction Oxygen Delivery Method: Circle system utilized Preoxygenation: Pre-oxygenation with 100% oxygen Induction Type: IV induction, Rapid sequence and Cricoid Pressure applied Laryngoscope Size: Glidescope and 3 Grade View: Grade I Tube type: Oral Tube size: 7.0 mm Number of attempts: 1 Airway Equipment and Method: Video-laryngoscopy Placement Confirmation: ETT inserted through vocal cords under direct vision, positive ETCO2 and breath sounds checked- equal and bilateral Secured at: 21 cm Tube secured with: Tape Dental Injury: Teeth and Oropharynx as per pre-operative assessment

## 2021-12-07 ENCOUNTER — Encounter (HOSPITAL_COMMUNITY): Payer: Self-pay | Admitting: Surgery

## 2021-12-07 LAB — BASIC METABOLIC PANEL
Anion gap: 16 — ABNORMAL HIGH (ref 5–15)
BUN: 9 mg/dL (ref 8–23)
CO2: 24 mmol/L (ref 22–32)
Calcium: 8 mg/dL — ABNORMAL LOW (ref 8.9–10.3)
Chloride: 92 mmol/L — ABNORMAL LOW (ref 98–111)
Creatinine, Ser: 0.82 mg/dL (ref 0.44–1.00)
GFR, Estimated: >60 mL/min (ref >60)
Glucose, Bld: 164 mg/dL — ABNORMAL HIGH (ref 70–99)
Potassium: 4 mmol/L (ref 3.5–5.1)
Sodium: 132 mmol/L — ABNORMAL LOW (ref 135–145)

## 2021-12-07 LAB — CBC
HCT: 38.6 % (ref 36.0–46.0)
Hemoglobin: 12.8 g/dL (ref 12.0–15.0)
MCH: 31.1 pg (ref 26.0–34.0)
MCHC: 33.2 g/dL (ref 30.0–36.0)
MCV: 93.9 fL (ref 80.0–100.0)
Platelets: 299 10*3/uL (ref 150–400)
RBC: 4.11 MIL/uL (ref 3.87–5.11)
RDW: 15 % (ref 11.5–15.5)
WBC: 23.4 10*3/uL — ABNORMAL HIGH (ref 4.0–10.5)
nRBC: 0 % (ref 0.0–0.2)

## 2021-12-07 LAB — GLUCOSE, CAPILLARY
Glucose-Capillary: 175 mg/dL — ABNORMAL HIGH (ref 70–99)
Glucose-Capillary: 176 mg/dL — ABNORMAL HIGH (ref 70–99)
Glucose-Capillary: 223 mg/dL — ABNORMAL HIGH (ref 70–99)
Glucose-Capillary: 186 mg/dL — ABNORMAL HIGH (ref 70–99)

## 2021-12-07 LAB — MAGNESIUM: Magnesium: 1.2 mg/dL — ABNORMAL LOW (ref 1.7–2.4)

## 2021-12-07 MED ORDER — MAGNESIUM SULFATE 2 GM/50ML IV SOLN
2.0000 g | Freq: Once | INTRAVENOUS | Status: AC
  Administered 2021-12-07: 2 g via INTRAVENOUS
  Filled 2021-12-07: qty 50

## 2021-12-07 MED ORDER — ENOXAPARIN SODIUM 40 MG/0.4ML IJ SOSY
40.0000 mg | PREFILLED_SYRINGE | INTRAMUSCULAR | Status: DC
  Administered 2021-12-07 – 2021-12-11 (×5): 40 mg via SUBCUTANEOUS
  Filled 2021-12-07 (×5): qty 0.4

## 2021-12-07 NOTE — Progress Notes (Signed)
PROGRESS NOTE  Judith [name] SEG:[id number] DOB: 1941/04/09 DOA: 11/28/2021 PCP: Janie Morning, DO   LOS: 8 days   Brief narrative:  Patient is 80 years old female with past medical history of diabetes, hypertension, peripheral neuropathy, gout presented to hospital with abdominal pain nausea and vomiting.  In the ED, patient had a CT scan which showed cholelithiasis and choledocholithiasis.  GI was consulted and patient underwent ERCP on 11/30/2021.  Patient did have extremely difficult ERCP.  General surgery was consulted and the patient underwent surgical intervention on 12/06/2021.  Assessment/Plan:  Principal Problem:   Choledocholithiasis Active Problems:   Peripheral neuropathy   Benign essential HTN   Presence of cardiac pacemaker   Gout   Type 2 diabetes mellitus with hyperglycemia (HCC)  Acute gangrenous cholecystitis.  Patient underwent a laparoscopic cholecystectomy on 12/06/2021 with findings of gangrenous cholecystitis and underwent subtotal cholecystectomy.  Drain was placed in.  Rising leukocytosis today.  We will follow surgical recommendations.  On IV Rocephin at this time.  Choledocholithiasis. Patient underwent difficult ERCP followed by biliary stent placement on 11/30/2021.  Plan is to follow-up with GI in 6 to 10 weeks for stent removal sphincterectomy extension and stone removal.    Leukocytosis.  Significant and trending up today at 23.4.  We will continue to monitor closely.  On IV Rocephin.  Type 2 diabetes mellitus with hyperglycemia. Currently on sliding scale insulin and long-acting insulin.  Continue to monitor blood glucose levels.  Latest hemoglobin A1c of 7.9.  Overall glycemic control is adequate.  History of gout. Continue colchicine.  Benign essential hypertension. Continue Coreg and losartan.  Blood pressure is mildly elevated.  We will continue to monitor closely.  Peripheral neuropathy. Continue gabapentin and duloxetine  Mild hypokalemia.   Improved after replacement.  Latest potassium of 4.0.  Hypomagnesemia.  Magnesium of 1.2 today.  Will replace with IV magnesium sulfate 2 g.  History of complete heart block status post pacemaker placement.  Disposition.  Patient is from [facility] assisted [facility] facility.  We will get PT evaluation.  Might need higher level of care   DVT prophylaxis: Place and maintain sequential compression device Start: 12/03/21 1840 SCDs Start: 11/28/21 1643   Code Status: Full code  Family Communication:  Spoke with the patient's daughter at bedside 12/06/2021.  Status is: Inpatient  Remains inpatient appropriate because: Status postcholecystectomy, hypomagnesemia, rising leukocytosis, PT evaluation and possible need for placement.   Consultants: GI General surgery  Procedures: ERCP with stent placement on 11/30/2021 Laparoscopic subtotal cholecystectomy on 12/06/2021.  Anti-infectives:  Rocephin IV.  Anti-infectives (From admission, onward)    Start     Dose/Rate Route Frequency Ordered Stop   12/06/21 2000  cefTRIAXone (ROCEPHIN) 2 g in sodium chloride 0.9 % 100 mL IVPB       Note to Pharmacy: Pharmacy may adjust dosing strength / duration / interval for maximal efficacy   2 g 200 mL/hr over 30 Minutes Intravenous Every 24 hours 12/06/21 1608     12/06/21 1252  ceFAZolin (ANCEF) 2-4 GM/100ML-% IVPB       Note to Pharmacy: Dellie Catholic: cabinet override      12/06/21 1252 12/07/21 0059   12/05/21 1030  ciprofloxacin (CIPRO) IVPB 400 mg  Status:  Discontinued        400 mg 200 mL/hr over 60 Minutes Intravenous 2 times daily 12/05/21 0935 12/05/21 1505   11/30/21 1415  ciprofloxacin (CIPRO) IVPB 400 mg        400 mg 200  mL/hr over 60 Minutes Intravenous  Once 11/30/21 1402 11/30/21 1501       Subjective: Today, patient was seen and examined at bedside.  Patient denies any bowel movements or gas.  He denies any shortness of breath chest pain palpitation.  Status post  laparoscopic cholecystectomy yesterday.  Objective: Vitals:   12/07/21 0153 12/07/21 0418  BP: (!) 155/66 (!) 156/65  Pulse: 75 72  Resp: 17 17  Temp: 98.3 F (36.8 C) 98.3 F (36.8 C)  SpO2: 92% 94%    Intake/Output Summary (Last 24 hours) at 12/07/2021 0810 Last data filed at 12/07/2021 0418 Gross per 24 hour  Intake 950 ml  Output 1155 ml  Net -205 ml    Filed Weights   11/28/21 1348 12/06/21 1138  Weight: 59 kg 59 kg   Body mass index is 23.04 kg/m.   Physical Exam:  GENERAL: Patient is alert awake and oriented. Not in obvious distress.  Mildly anxious HENT: No scleral pallor or icterus. Pupils equally reactive to light. Oral mucosa is moist NECK: is supple, no gross swelling noted. CHEST: Clear to auscultation. No crackles or wheezes.  Diminished breath sounds bilaterally. CVS: S1 and S2 heard, no murmur. Regular rate and rhythm.  ABDOMEN: Soft, appropriately tender on palpation, status post laparoscopy, right upper quadrant drain in place. EXTREMITIES: No edema. CNS: Cranial nerves are intact. No focal motor deficits. SKIN: warm and dry without rashes.  Data Review: I have personally reviewed the following laboratory data and studies,  CBC: Recent Labs  Lab 12/01/21 0539 12/02/21 0528 12/05/21 0804 12/06/21 0448 12/07/21 0443  WBC 14.5* 11.1* 12.6* 11.4* 23.4*  HGB 12.5 10.4* 11.3* 11.9* 12.8  HCT 38.4 31.9* 34.3* 36.2 38.6  MCV 94.3 93.8 92.7 93.1 93.9  PLT 178 180 190 229 409    Basic Metabolic Panel: Recent Labs  Lab 12/03/21 0530 12/04/21 0448 12/05/21 0804 12/06/21 0448 12/07/21 0443  NA 134* 135 135 135 132*  K 3.7 3.4* 3.7 3.2* 4.0  CL 97* 98 96* 95* 92*  CO2 28 28 29 29 24   GLUCOSE 145* 95 123* 118* 164*  BUN 15 8 8  5* 9  CREATININE 0.88 0.72 0.80 0.70 0.82  CALCIUM 8.2* 8.1* 8.2* 8.7* 8.0*  MG  --   --   --   --  1.2*    Liver Function Tests: Recent Labs  Lab 12/02/21 0528 12/03/21 0530 12/04/21 0448 12/05/21 0804  12/06/21 0448  AST 323* 573* 173* 59* 46*  ALT 104* 223* 132* 80* 64*  ALKPHOS 188* 312* 243* 215* 208*  BILITOT 0.9 3.3* 3.2* 2.0* 1.5*  PROT 5.4* 5.6* 5.6* 5.2* 5.6*  ALBUMIN 3.0* 3.1* 3.0* 2.7* 2.8*    Recent Labs  Lab 12/02/21 0528  LIPASE 22    No results for input(s): AMMONIA in the last 168 hours. Cardiac Enzymes: No results for input(s): CKTOTAL, CKMB, CKMBINDEX, TROPONINI in the last 168 hours. BNP (last 3 results) No results for input(s): BNP in the last 8760 hours.  ProBNP (last 3 results) No results for input(s): PROBNP in the last 8760 hours.  CBG: Recent Labs  Lab 12/06/21 1103 12/06/21 1551 12/06/21 1609 12/06/21 2123 12/07/21 0746  GLUCAP 118* 188* 192* 203* 175*    Recent Results (from the past 240 hour(s))  Resp Panel by RT-PCR (Flu A&B, Covid) Nasopharyngeal Swab     Status: None   Collection Time: 12/03/21  9:32 AM   Specimen: Nasopharyngeal Swab; Nasopharyngeal(NP) swabs in vial transport medium  Result Value Ref Range Status   SARS Coronavirus 2 by RT PCR NEGATIVE NEGATIVE Final    Comment: (NOTE) SARS-CoV-2 target nucleic acids are NOT DETECTED.  The SARS-CoV-2 RNA is generally detectable in upper respiratory specimens during the acute phase of infection. The lowest concentration of SARS-CoV-2 viral copies this assay can detect is 138 copies/mL. A negative result does not preclude SARS-Cov-2 infection and should not be used as the sole basis for treatment or other patient management decisions. A negative result may occur with  improper specimen collection/handling, submission of specimen other than nasopharyngeal swab, presence of viral mutation(s) within the areas targeted by this assay, and inadequate number of viral copies(<138 copies/mL). A negative result must be combined with clinical observations, patient history, and epidemiological information. The expected result is Negative.  Fact Sheet for Patients:   EntrepreneurPulse.com.au  Fact Sheet for Healthcare Providers:  IncredibleEmployment.be  This test is no t yet approved or cleared by the [place] FDA and  has been authorized for detection and/or diagnosis of SARS-CoV-2 by FDA under an Emergency Use Authorization (EUA). This EUA will remain  in effect (meaning this test can be used) for the duration of the COVID-19 declaration under Section 564(b)(1) of the Act, 21 U.S.C.section 360bbb-3(b)(1), unless the authorization is terminated  or revoked sooner.       Influenza A by PCR NEGATIVE NEGATIVE Final   Influenza B by PCR NEGATIVE NEGATIVE Final    Comment: (NOTE) The Xpert Xpress SARS-CoV-2/FLU/RSV plus assay is intended as an aid in the diagnosis of influenza from Nasopharyngeal swab specimens and should not be used as a sole basis for treatment. Nasal washings and aspirates are unacceptable for Xpert Xpress SARS-CoV-2/FLU/RSV testing.  Fact Sheet for Patients: EntrepreneurPulse.com.au  Fact Sheet for Healthcare Providers: IncredibleEmployment.be  This test is not yet approved or cleared by the [place] FDA and has been authorized for detection and/or diagnosis of SARS-CoV-2 by FDA under an Emergency Use Authorization (EUA). This EUA will remain in effect (meaning this test can be used) for the duration of the COVID-19 declaration under Section 564(b)(1) of the Act, 21 U.S.C. section 360bbb-3(b)(1), unless the authorization is terminated or revoked.  Performed at [facility], [place] [address] [place], [place] [address]       Studies: No results found.    Flora Lipps, MD  [facility] 12/07/2021  If 7PM-7AM, please contact night-coverage

## 2021-12-07 NOTE — Progress Notes (Signed)
1 Day Post-Op  Subjective: CC: Having upper abdominal pain, mainly on the right side around her drain. Tolerating diet without n/v. RN reports she mobilized to the bathroom this morning but hasn't mobilized in the halls. Voiding.   Objective: Vital signs in last 24 hours: Temp:  [97.7 F (36.5 C)-99.1 F (37.3 C)] 98.3 F (36.8 C) (12/15 0418) Pulse Rate:  [61-81] 65 (12/15 0831) Resp:  [14-17] 17 (12/15 0418) BP: (151-189)/(60-144) 151/60 (12/15 0831) SpO2:  [92 %-98 %] 94 % (12/15 0418) Weight:  [59 kg] 59 kg (12/14 1138) Last BM Date: 12/04/21  Intake/Output from previous day: 12/14 0701 - 12/15 0700 In: 950 [I.V.:900; IV Piggyback:50] Out: 6659 [Urine:1000; Drains:105; Blood:50] Intake/Output this shift: No intake/output data recorded.  PE: Gen:  Alert, NAD, pleasant Pulm: effort and rate normal Abd: Soft, ND, epigastric and ruq tenderness mainly around incisions and without peritonitis. +BS, incisions with glue intact appears well and are without drainage, bleeding, or signs of infection, drain with dark red output. Non-bilious  Psych: A&Ox3  Skin: no rashes noted, warm and dry  Lab Results:  Recent Labs    12/06/21 0448 12/07/21 0443  WBC 11.4* 23.4*  HGB 11.9* 12.8  HCT 36.2 38.6  PLT 229 299   BMET Recent Labs    12/06/21 0448 12/07/21 0443  NA 135 132*  K 3.2* 4.0  CL 95* 92*  CO2 29 24  GLUCOSE 118* 164*  BUN 5* 9  CREATININE 0.70 0.82  CALCIUM 8.7* 8.0*   PT/INR No results for input(s): LABPROT, INR in the last 72 hours. CMP     Component Value Date/Time   NA 132 (L) 12/07/2021 0443   NA 140 02/14/2021 1017   K 4.0 12/07/2021 0443   CL 92 (L) 12/07/2021 0443   CO2 24 12/07/2021 0443   GLUCOSE 164 (H) 12/07/2021 0443   BUN 9 12/07/2021 0443   BUN 15 02/14/2021 1017   CREATININE 0.82 12/07/2021 0443   CALCIUM 8.0 (L) 12/07/2021 0443   PROT 5.6 (L) 12/06/2021 0448   PROT 6.7 02/14/2021 1017   ALBUMIN 2.8 (L) 12/06/2021 0448    ALBUMIN 4.2 02/14/2021 1017   AST 46 (H) 12/06/2021 0448   ALT 64 (H) 12/06/2021 0448   ALKPHOS 208 (H) 12/06/2021 0448   BILITOT 1.5 (H) 12/06/2021 0448   BILITOT 0.5 02/14/2021 1017   GFRNONAA >60 12/07/2021 0443   GFRAA 61 02/14/2021 1017   Lipase     Component Value Date/Time   LIPASE 22 12/02/2021 0528    Studies/Results: No results found.  Anti-infectives: Anti-infectives (From admission, onward)    Start     Dose/Rate Route Frequency Ordered Stop   12/06/21 2000  cefTRIAXone (ROCEPHIN) 2 g in sodium chloride 0.9 % 100 mL IVPB       Note to Pharmacy: Pharmacy may adjust dosing strength / duration / interval for maximal efficacy   2 g 200 mL/hr over 30 Minutes Intravenous Every 24 hours 12/06/21 1608     12/06/21 1252  ceFAZolin (ANCEF) 2-4 GM/100ML-% IVPB       Note to Pharmacy: Dellie Catholic: cabinet override      12/06/21 1252 12/07/21 0059   12/05/21 1030  ciprofloxacin (CIPRO) IVPB 400 mg  Status:  Discontinued        400 mg 200 mL/hr over 60 Minutes Intravenous 2 times daily 12/05/21 0935 12/05/21 1505   11/30/21 1415  ciprofloxacin (CIPRO) IVPB 400 mg  400 mg 200 mL/hr over 60 Minutes Intravenous  Once 11/30/21 1402 11/30/21 1501        Assessment/Plan POD 1 s/p Laparoscopic subtotal cholecystectomy with drain placement for gangrenous cholecystitis - Dr. Brantley Stage, 12/06/2021 - Patient is s/p ERCP pre-op, 11/30/2021, Biliary Fistulotomy and CBD stent placement by Dr. Rush Landmark  - Cont abx - Cont drain - Mobilize. PT - Pulm toilet   FEN: CM, IVF per TRH ID: Rocephin VTE: SCDs, Lovenox   Complete heart block status post pacemaker placement Hypertension Hyperlipidemia Diabetes mellitus -A1c 7.9   LOS: 8 days    Jillyn Ledger , [facility] 12/07/2021, 10:30 AM Please see Amion for pager number during day hours 7:00am-4:30pm

## 2021-12-07 NOTE — Evaluation (Signed)
Physical Therapy Evaluation Patient Details Name: Judith [name] MRN: [id number] DOB: 1941-06-28 Today's Date: 12/07/2021  History of Present Illness  Pt is an 80 year old female s/p Laparoscopic subtotal cholecystectomy with drain placement for gangrenous cholecystitis - Dr. Brantley Stage, 12/06/2021.  Past medical history significant of but not limited to diabetes, hypertension, peripheral neuropathy, gout  Clinical Impression  Pt admitted with above diagnosis. Pt currently with functional limitations due to the deficits listed below (see PT Problem List). Pt will benefit from skilled PT to increase their independence and safety with mobility to allow discharge to the venue listed below.  Pt assisted with mobility however only able to tolerate in room at this time.  Pt from ALF however they were only providing assist for IADLs prior to admission per pt.  Recommend SNF if ALF is unable to provide current level of care.       Recommendations for follow up therapy are one component of a multi-disciplinary discharge planning process, led by the attending physician.  Recommendations may be updated based on patient status, additional functional criteria and insurance authorization.  Follow Up Recommendations Skilled nursing-short term rehab (<3 hours/day)    Assistance Recommended at Discharge Set up Supervision/Assistance  Functional Status Assessment Patient has had a recent decline in their functional status and demonstrates the ability to make significant improvements in function in a reasonable and predictable amount of time.  Equipment Recommendations  None recommended by PT    Recommendations for Other Services       Precautions / Restrictions Precautions Precautions: Fall Precaution Comments: JP drain      Mobility  Bed Mobility Overal bed mobility: Needs Assistance Bed Mobility: Rolling;Sidelying to Sit Rolling: Min assist Sidelying to sit: Min assist       General bed mobility  comments: cues for log roll technique, assist to LE positioning and then trunk upright    Transfers Overall transfer level: Needs assistance Equipment used: Rolling walker (2 wheels) Transfers: Sit to/from Stand Sit to Stand: Min guard           General transfer comment: verbal cues for hand placement    Ambulation/Gait Ambulation/Gait assistance: Min guard Gait Distance (Feet): 10 Feet (x2) Assistive device: Rolling walker (2 wheels) Gait Pattern/deviations: Step-through pattern;Decreased stride length;Trunk flexed Gait velocity: decr     General Gait Details: verbal cues for posture and RW positioning, pt ambulated to/from bathroom, too fatigued to ambulate in hallway however agreeable to remain OOB in recliner  Stairs            Wheelchair Mobility    Modified Rankin (Stroke Patients Only)       Balance Overall balance assessment: Needs assistance         Standing balance support: No upper extremity supported Standing balance-Leahy Scale: Fair                               Pertinent Vitals/Pain Pain Assessment: Faces Faces Pain Scale: Hurts even more Pain Location: abdomen Pain Descriptors / Indicators: Grimacing;Sore Pain Intervention(s): Repositioned;Monitored during session;RN gave pain meds during session    [place] expects to be discharged to:: Assisted living                        Prior Function Prior Level of Function : Needs assist             Mobility Comments: typically ambulatory without  assistive device, able to perform ADLs ADLs Comments: ALF assists with housekeeping and laundry     Hand Dominance        Extremity/Trunk Assessment        Lower Extremity Assessment Lower Extremity Assessment: Generalized weakness       Communication   Communication: No difficulties  Cognition Arousal/Alertness: Awake/alert Behavior During Therapy: WFL for tasks assessed/performed Overall  Cognitive Status: Within Functional Limits for tasks assessed                                          General Comments      Exercises     Assessment/Plan    PT Assessment Patient needs continued PT services  PT Problem List Decreased mobility;Decreased activity tolerance;Decreased strength;Decreased knowledge of use of DME       PT Treatment Interventions DME instruction;Gait training;Balance training;Therapeutic exercise;Functional mobility training;Therapeutic activities;Patient/family education    PT Goals (Current goals can be found in the Care Plan section)  Acute Rehab PT Goals PT Goal Formulation: With patient Time For Goal Achievement: 12/21/21 Potential to Achieve Goals: Good    Frequency Min 2X/week   Barriers to discharge        Co-evaluation               AM-PAC PT "6 Clicks" Mobility  Outcome Measure Help needed turning from your back to your side while in a flat bed without using bedrails?: A Little Help needed moving from lying on your back to sitting on the side of a flat bed without using bedrails?: A Little Help needed moving to and from a bed to a chair (including a wheelchair)?: A Little Help needed standing up from a chair using your arms (e.g., wheelchair or bedside chair)?: A Little Help needed to walk in hospital room?: A Little Help needed climbing 3-5 steps with a railing? : A Lot 6 Click Score: 17    End of Session Equipment Utilized During Treatment: Gait belt Activity Tolerance: Patient limited by fatigue Patient left: in chair;with call bell/phone within reach;with chair alarm set;with nursing/sitter in room Nurse Communication: Mobility status PT Visit Diagnosis: Difficulty in walking, not elsewhere classified (R26.2)    Time: 1018-1030 PT Time Calculation (min) (ACUTE ONLY): 12 min   Charges:   PT Evaluation $PT Eval Low Complexity: 1 Low        Kati PT, DPT Acute Rehabilitation Services Pager:  [phone] Office: [place] 12/07/2021, 1:17 PM

## 2021-12-07 NOTE — Plan of Care (Signed)
  Problem: Education: Goal: Knowledge of General Education information will improve Description: Including pain rating scale, medication(s)/side effects and non-pharmacologic comfort measures Outcome: Progressing   Problem: Coping: Goal: Level of anxiety will decrease Outcome: Progressing   Problem: Pain Managment: Goal: General experience of comfort will improve Outcome: Progressing   Problem: Skin Integrity: Goal: Risk for impaired skin integrity will decrease Outcome: Progressing   

## 2021-12-07 NOTE — Care Management Important Message (Signed)
Important Message  Patient Details IM Letter given to the Patient. Name: Judith [name] MRN: [id number] Date of Birth: [birth year]   Medicare Important Message Given:  Yes     Kerin Salen 12/07/2021, 11:52 AM

## 2021-12-08 LAB — COMPREHENSIVE METABOLIC PANEL
ALT: 46 U/L — ABNORMAL HIGH (ref 0–44)
AST: 78 U/L — ABNORMAL HIGH (ref 15–41)
Albumin: 2.9 g/dL — ABNORMAL LOW (ref 3.5–5.0)
Alkaline Phosphatase: 155 U/L — ABNORMAL HIGH (ref 38–126)
Anion gap: 11 (ref 5–15)
BUN: 10 mg/dL (ref 8–23)
CO2: 28 mmol/L (ref 22–32)
Calcium: 7.6 mg/dL — ABNORMAL LOW (ref 8.9–10.3)
Chloride: 91 mmol/L — ABNORMAL LOW (ref 98–111)
Creatinine, Ser: 0.77 mg/dL (ref 0.44–1.00)
GFR, Estimated: >60 mL/min (ref >60)
Glucose, Bld: 152 mg/dL — ABNORMAL HIGH (ref 70–99)
Potassium: 3.4 mmol/L — ABNORMAL LOW (ref 3.5–5.1)
Sodium: 130 mmol/L — ABNORMAL LOW (ref 135–145)
Total Bilirubin: 1.2 mg/dL (ref 0.3–1.2)
Total Protein: 5.8 g/dL — ABNORMAL LOW (ref 6.5–8.1)

## 2021-12-08 LAB — GLUCOSE, CAPILLARY
Glucose-Capillary: 153 mg/dL — ABNORMAL HIGH (ref 70–99)
Glucose-Capillary: 166 mg/dL — ABNORMAL HIGH (ref 70–99)
Glucose-Capillary: 117 mg/dL — ABNORMAL HIGH (ref 70–99)
Glucose-Capillary: 116 mg/dL — ABNORMAL HIGH (ref 70–99)

## 2021-12-08 LAB — CBC
HCT: 35.5 % — ABNORMAL LOW (ref 36.0–46.0)
Hemoglobin: 11.5 g/dL — ABNORMAL LOW (ref 12.0–15.0)
MCH: 30.7 pg (ref 26.0–34.0)
MCHC: 32.4 g/dL (ref 30.0–36.0)
MCV: 94.9 fL (ref 80.0–100.0)
Platelets: 259 10*3/uL (ref 150–400)
RBC: 3.74 MIL/uL — ABNORMAL LOW (ref 3.87–5.11)
RDW: 14.9 % (ref 11.5–15.5)
WBC: 19 10*3/uL — ABNORMAL HIGH (ref 4.0–10.5)
nRBC: 0 % (ref 0.0–0.2)

## 2021-12-08 LAB — SURGICAL PATHOLOGY

## 2021-12-08 LAB — MAGNESIUM: Magnesium: 1.8 mg/dL (ref 1.7–2.4)

## 2021-12-08 MED ORDER — DIPHENHYDRAMINE HCL 25 MG PO CAPS: 25.0000 mg | ORAL_CAPSULE | Freq: Four times a day (QID) | ORAL | Status: DC | PRN

## 2021-12-08 MED ORDER — POTASSIUM CHLORIDE CRYS ER 20 MEQ PO TBCR
40.0000 meq | EXTENDED_RELEASE_TABLET | Freq: Once | ORAL | Status: AC
  Administered 2021-12-08: 40 meq via ORAL
  Filled 2021-12-08: qty 2

## 2021-12-08 MED ORDER — PANTOPRAZOLE SODIUM 40 MG PO TBEC
40.0000 mg | DELAYED_RELEASE_TABLET | Freq: Two times a day (BID) | ORAL | Status: DC
  Administered 2021-12-08 – 2021-12-11 (×6): 40 mg via ORAL
  Filled 2021-12-08 (×6): qty 1

## 2021-12-08 NOTE — Plan of Care (Signed)

## 2021-12-08 NOTE — TOC Progression Note (Addendum)
Transition of Care [facility]) - Progression Note    Patient Details  Name: Delynn Olvera MRN: [id number] Date of Birth: 1941-04-03  [facility] CM/SW Contact  Leeroy Cha, RN Phone Number: 12/08/2021, 9:56 AM  Clinical Narrative:    Fl2 done and sent out via the hub to area snf's. [facility] number is [phone] A Spoke with patient and daughter has no preference at this time but has someone who has searched for snf's in the area.  She will call them and let me know. Patient and daughter have chosen [facility].  Levander Campion at [facility] message left to please call me back. [facility] can not take until Monday due to covid outbreak Expected Discharge Plan: Assisted Living Barriers to Discharge: Continued Medical Work up  Expected Discharge Plan and Services Expected Discharge Plan: Assisted Living   Discharge Planning Services: CM Consult                                           Social Determinants of Health (SDOH) Interventions    Readmission Risk Interventions No flowsheet data found.

## 2021-12-08 NOTE — Discharge Instructions (Signed)
[facility]  Please arrive at least 30 min before your appointment to complete your check in paperwork.  If you are unable to arrive 30 min prior to your appointment time we may have to cancel or reschedule you. LAPAROSCOPIC SURGERY: POST OP INSTRUCTIONS Always review your discharge instruction sheet given to you by the facility where your surgery was performed. IF YOU HAVE DISABILITY OR FAMILY LEAVE FORMS, YOU MUST BRING THEM TO THE OFFICE FOR PROCESSING.   DO NOT GIVE THEM TO YOUR DOCTOR.  PAIN CONTROL  First take acetaminophen (Tylenol) AND/or ibuprofen (Advil) to control your pain after surgery.  Follow directions on package.  Taking acetaminophen (Tylenol) and/or ibuprofen (Advil) regularly after surgery will help to control your pain and lower the amount of prescription pain medication you may need.  You should not take more than 4,000 mg (4 grams) of acetaminophen (Tylenol) in 24 hours.  You should not take ibuprofen (Advil), aleve, motrin, naprosyn or other NSAIDS if you have a history of stomach ulcers or chronic kidney disease.  A prescription for pain medication may be given to you upon discharge.  Take your pain medication as prescribed, if you still have uncontrolled pain after taking acetaminophen (Tylenol) or ibuprofen (Advil). Use ice packs to help control pain. If you need a refill on your pain medication, please contact your pharmacy.  They will contact our office to request authorization. Prescriptions will not be filled after 5pm or on week-ends.  HOME MEDICATIONS Take your usually prescribed medications unless otherwise directed.  DIET You should follow a light diet the first few days after arrival home.  Be sure to include lots of fluids daily. Avoid fatty, fried foods.   CONSTIPATION It is common to experience some constipation after surgery and if you are taking pain medication.  Increasing fluid intake and taking a stool softener (such as Colace)  will usually help or prevent this problem from occurring.  A mild laxative (Milk of Magnesia or Miralax) should be taken according to package instructions if there are no bowel movements after 48 hours.  WOUND/INCISION CARE Most patients will experience some swelling and bruising in the area of the incisions.  Ice packs will help.  Swelling and bruising can take several days to resolve.  Unless discharge instructions indicate otherwise, follow guidelines below  STERI-STRIPS - you may remove your outer bandages 48 hours after surgery, and you may shower at that time.  You have steri-strips (small skin tapes) in place directly over the incision.  These strips should be left on the skin for 7-10 days.   DERMABOND/SKIN GLUE - you may shower in 24 hours.  The glue will flake off over the next 2-3 weeks. Any sutures or staples will be removed at the office during your follow-up visit.  ACTIVITIES You may resume regular (light) daily activities beginning the next day--such as daily self-care, walking, climbing stairs--gradually increasing activities as tolerated.  You may have sexual intercourse when it is comfortable.  Refrain from any heavy lifting or straining until approved by your doctor. You may drive when you are no longer taking prescription pain medication, you can comfortably wear a seatbelt, and you can safely maneuver your car and apply brakes.  FOLLOW-UP You should see your doctor in the office for a follow-up appointment approximately 2-3 weeks after your surgery.  You should have been given your post-op/follow-up appointment when your surgery was scheduled.  If you did not receive a post-op/follow-up appointment, make sure   that you call for this appointment within a day or two after you arrive home to insure a convenient appointment time.   WHEN TO CALL YOUR DOCTOR: Fever over 101.0 Inability to urinate Continued bleeding from incision. Increased pain, redness, or drainage from the  incision. Increasing abdominal pain  The clinic staff is available to answer your questions during regular business hours.  Please don't hesitate to call and ask to speak to one of the nurses for clinical concerns.  If you have a medical emergency, go to the nearest emergency room or call 911.  A surgeon from [facility] is always on call at the hospital. [address], [address], [place], [place]  [address] ? [address] [address], [place], [place]   [address] [phone] ? [phone] ? FAX [phone]  

## 2021-12-08 NOTE — Plan of Care (Signed)
°  Problem: Education: Goal: Knowledge of General Education information will improve Description: Including pain rating scale, medication(s)/side effects and non-pharmacologic comfort measures Outcome: Progressing   Problem: Clinical Measurements: Goal: Will remain free from infection Outcome: Progressing Goal: Diagnostic test results will improve Outcome: Progressing Goal: Respiratory complications will improve Outcome: Progressing Goal: Cardiovascular complication will be avoided Outcome: Progressing   Problem: Activity: Goal: Risk for activity intolerance will decrease Outcome: Progressing

## 2021-12-08 NOTE — Progress Notes (Signed)
2 Days Post-Op  Subjective: CC: Still some epigastric and ruq pain that is improving and feels is well controlled on pain medication. Tolerating diet without n/v. PT rec SNF.   Objective: Vital signs in last 24 hours: Temp:  [97.5 F (36.4 C)-97.8 F (36.6 C)] 97.8 F (36.6 C) (12/16 0519) Pulse Rate:  [64-71] 68 (12/16 0519) Resp:  [18] 18 (12/15 1700) BP: (129-166)/(58-67) 166/67 (12/16 0519) SpO2:  [93 %-97 %] 93 % (12/16 0519) Last BM Date: 12/04/21  Intake/Output from previous day: 12/15 0701 - 12/16 0700 In: 2120 [P.O.:1020; I.V.:1100] Out: 120 [Drains:120] Intake/Output this shift: No intake/output data recorded.  PE: Gen:  Alert, NAD, pleasant Pulm: effort and rate normal Abd: Soft, ND, epigastric and ruq tenderness mainly around incisions and drain. No peritonitis. +BS, incisions with glue intact appears well and are without drainage, bleeding, or signs of infection. Drain with dark red output and non-bilious  Psych: A&Ox3  Skin: no rashes noted, warm and dry  Lab Results:  Recent Labs    12/07/21 0443 12/08/21 0447  WBC 23.4* 19.0*  HGB 12.8 11.5*  HCT 38.6 35.5*  PLT 299 259   BMET Recent Labs    12/07/21 0443 12/08/21 0447  NA 132* 130*  K 4.0 3.4*  CL 92* 91*  CO2 24 28  GLUCOSE 164* 152*  BUN 9 10  CREATININE 0.82 0.77  CALCIUM 8.0* 7.6*   PT/INR No results for input(s): LABPROT, INR in the last 72 hours. CMP     Component Value Date/Time   NA 130 (L) 12/08/2021 0447   NA 140 02/14/2021 1017   K 3.4 (L) 12/08/2021 0447   CL 91 (L) 12/08/2021 0447   CO2 28 12/08/2021 0447   GLUCOSE 152 (H) 12/08/2021 0447   BUN 10 12/08/2021 0447   BUN 15 02/14/2021 1017   CREATININE 0.77 12/08/2021 0447   CALCIUM 7.6 (L) 12/08/2021 0447   PROT 5.8 (L) 12/08/2021 0447   PROT 6.7 02/14/2021 1017   ALBUMIN 2.9 (L) 12/08/2021 0447   ALBUMIN 4.2 02/14/2021 1017   AST 78 (H) 12/08/2021 0447   ALT 46 (H) 12/08/2021 0447   ALKPHOS 155 (H)  12/08/2021 0447   BILITOT 1.2 12/08/2021 0447   BILITOT 0.5 02/14/2021 1017   GFRNONAA >60 12/08/2021 0447   GFRAA 61 02/14/2021 1017   Lipase     Component Value Date/Time   LIPASE 22 12/02/2021 0528    Studies/Results: No results found.  Anti-infectives: Anti-infectives (From admission, onward)    Start     Dose/Rate Route Frequency Ordered Stop   12/06/21 2000  cefTRIAXone (ROCEPHIN) 2 g in sodium chloride 0.9 % 100 mL IVPB       Note to Pharmacy: Pharmacy may adjust dosing strength / duration / interval for maximal efficacy   2 g 200 mL/hr over 30 Minutes Intravenous Every 24 hours 12/06/21 1608     12/06/21 1252  ceFAZolin (ANCEF) 2-4 GM/100ML-% IVPB       Note to Pharmacy: Dellie Catholic: cabinet override      12/06/21 1252 12/07/21 0059   12/05/21 1030  ciprofloxacin (CIPRO) IVPB 400 mg  Status:  Discontinued        400 mg 200 mL/hr over 60 Minutes Intravenous 2 times daily 12/05/21 0935 12/05/21 1505   11/30/21 1415  ciprofloxacin (CIPRO) IVPB 400 mg        400 mg 200 mL/hr over 60 Minutes Intravenous  Once 11/30/21 1402 11/30/21 1501  Assessment/Plan POD 2 s/p Laparoscopic subtotal cholecystectomy with drain placement for gangrenous cholecystitis - Dr. Brantley Stage, 12/06/2021 - Patient is s/p ERCP pre-op, 11/30/2021, Biliary Fistulotomy and CBD stent placement by Dr. Rush Landmark. GI has already arranged follow up for repeat ERCP on 02/01/22 per notes - Cont abx. 5d post op - Cont drain. Can d/c prior to discharge if output remains non-bilious - Mobilize. PT. Rec SNF - Pulm toilet - Surgical stable for discharge to SNF when bed available. Will arrange follow up.   FEN: CM, IVF per [facility] ID: Rocephin 12/14 >>  VTE: SCDs, Lovenox   Complete heart block status post pacemaker placement Hypertension Hyperlipidemia Diabetes mellitus -A1c 7.9   LOS: 9 days    Jillyn Ledger , [facility] 12/08/2021, 9:53 AM Please see Amion for pager number  during day hours 7:00am-4:30pm

## 2021-12-08 NOTE — NC FL2 (Signed)
[place] LEVEL OF CARE SCREENING TOOL     IDENTIFICATION  Patient Name: Judith [name] Birthdate: 12/09/1941 Sex: female Admission Date (Current Location): 11/28/2021  [facility] and [place] Number:  Herbalist and Address:  [facility],  [address] [place], [place]      Provider Number: [id number]  Attending Physician Name and Address:  Flora Lipps, MD  Relative Name and Phone Number:       Current Level of Care: Hospital Recommended Level of Care: [place] Prior Approval Number:    Date Approved/Denied:   PASRR Number: [id number]  Discharge Plan: SNF    Current Diagnoses: Patient Active Problem List   Diagnosis Date Noted   Choledocholithiasis 11/28/2021   Sleep apnea    Hyperlipidemia    Type 2 diabetes mellitus with hyperglycemia ([place])    Polycythemia    Unspecified inflammatory spondylopathy, cervical region ([place]) 11/15/2020   Multilevel degenerative disc disease 04/03/2018   Acute blood loss anemia 01/17/2018   Gallstones 01/17/2018   Weight loss, unintentional 01/17/2018   Dilated bile duct    Elevated liver function tests    Gastritis and gastroduodenitis    S/P partial gastrectomy    Peripheral neuropathy    Benign essential HTN    Gastroparesis    Duodenal ulcer disease    Controlled type 2 diabetes mellitus with diabetic neuropathy, with long-term current use of insulin (HCC)    Depression    Biliary gastritis    Seasonal allergies    Presence of cardiac pacemaker    Gout    Cervical spine arthritis 04/05/2016   Long-term insulin use in type 2 diabetes ([place]) 04/05/2016    Orientation RESPIRATION BLADDER Height & Weight     Self, Time, Situation, Place  Normal Continent Weight: 59 kg Height:  5\' 3"  (160 cm)  BEHAVIORAL SYMPTOMS/MOOD NEUROLOGICAL BOWEL NUTRITION STATUS      Continent Diet (regular)  AMBULATORY STATUS COMMUNICATION OF NEEDS Skin   Extensive Assist Verbally Normal                        Personal Care Assistance Level of Assistance  Bathing, Feeding, Dressing Bathing Assistance: Limited assistance Feeding assistance: Limited assistance Dressing Assistance: Limited assistance     Functional Limitations Info  Sight, Hearing, Speech Sight Info: Adequate Hearing Info: Adequate Speech Info: Adequate    SPECIAL CARE FACTORS FREQUENCY  PT (By licensed PT), OT (By licensed OT)     PT Frequency: 5 x weekly OT Frequency: 5 x weekly            Contractures      Additional Factors Info  Code Status Code Status Info: full             Current Medications (12/08/2021):  This is the current hospital active medication list Current Facility-Administered Medications  Medication Dose Route Frequency Provider Last Rate Last Admin   carvedilol (COREG) tablet 25 mg  25 mg Oral BID WC Jill Alexanders, PA-C   25 mg at 12/08/21 6283   cefTRIAXone (ROCEPHIN) 2 g in sodium chloride 0.9 % 100 mL IVPB  2 g Intravenous Q24H Jill Alexanders, PA-C 200 mL/hr at 12/07/21 2000 2 g at 12/07/21 2000   colchicine tablet 0.6 mg  0.6 mg Oral Daily Jill Alexanders, PA-C   0.6 mg at 12/08/21 1517   diphenhydrAMINE (BENADRYL) injection 25 mg  25 mg Intravenous Q6H PRN Obie Dredge  S, PA-C   25 mg at 12/05/21 1426   DULoxetine (CYMBALTA) DR capsule 60 mg  60 mg Oral Daily Jill Alexanders, PA-C   60 mg at 12/08/21 0828   enoxaparin (LOVENOX) injection 40 mg  40 mg Subcutaneous Q24H Jillyn Ledger, PA-C   40 mg at 12/07/21 1312   gabapentin (NEURONTIN) capsule 100 mg  100 mg Oral BID Jill Alexanders, PA-C   100 mg at 12/08/21 1601   gabapentin (NEURONTIN) capsule 400 mg  400 mg Oral QHS Jill Alexanders, PA-C   400 mg at 12/07/21 2202   insulin aspart (novoLOG) injection 0-5 Units  0-5 Units Subcutaneous QHS Jill Alexanders, PA-C   2 Units at 12/06/21 2140   insulin aspart (novoLOG) injection 0-9 Units  0-9 Units Subcutaneous TID WC  Jill Alexanders, PA-C   2 Units at 12/08/21 0827   insulin glargine-yfgn [facility]) injection 15 Units  15 Units Subcutaneous Daily Jill Alexanders, [place]   15 Units at 12/07/21 1029   lactated ringers infusion   Intravenous Continuous Jill Alexanders, PA-C 100 mL/hr at 12/07/21 1800 Infusion Verify at 12/07/21 1800   lidocaine (LIDODERM) 5 % 1 patch  1 patch Transdermal Q24H Jill Alexanders, PA-C   1 patch at 12/07/21 1710   losartan (COZAAR) tablet 100 mg  100 mg Oral Daily Jill Alexanders, PA-C   100 mg at 12/08/21 0932   morphine 2 MG/ML injection 2 mg  2 mg Intravenous Q3H PRN Jill Alexanders, PA-C   2 mg at 12/06/21 1941   multivitamin with minerals tablet 1 tablet  1 tablet Oral Daily Jill Alexanders, PA-C   1 tablet at 12/08/21 0828   ondansetron (ZOFRAN) tablet 4 mg  4 mg Oral Q6H PRN Jill Alexanders, PA-C   4 mg at 11/29/21 1641   Or   ondansetron (ZOFRAN) injection 4 mg  4 mg Intravenous Q6H PRN Jill Alexanders, PA-C   4 mg at 12/02/21 0900   oxyCODONE (Oxy IR/ROXICODONE) immediate release tablet 5 mg  5 mg Oral Q6H PRN Jill Alexanders, PA-C   5 mg at 12/07/21 2008   pantoprazole (PROTONIX) injection 40 mg  40 mg Intravenous Q12H Jill Alexanders, PA-C   40 mg at 12/08/21 0827   sodium chloride (OCEAN) 0.65 % nasal spray 1 spray  1 spray Each Nare PRN Jill Alexanders, PA-C   1 spray at 12/05/21 2154   traMADol (ULTRAM) tablet 50 mg  50 mg Oral Q6H PRN Jill Alexanders, PA-C   50 mg at 12/07/21 3557     Discharge Medications: Please see discharge summary for a list of discharge medications.  Relevant Imaging Results:  Relevant Lab Results:   Additional Information [id number]  Leeroy Cha, RN

## 2021-12-08 NOTE — Progress Notes (Signed)
PHARMACIST - PHYSICIAN COMMUNICATION  DR:   Louanne Belton  CONCERNING: IV to Oral Route Change Policy  RECOMMENDATION: This patient is receiving Protonix and Benadryl by the intravenous route.  Based on criteria approved by the Pharmacy and Therapeutics Committee, the intravenous medication(s) is/are being converted to the equivalent oral dose form(s).   DESCRIPTION: These criteria include: The patient is eating (either orally or via tube) and/or has been taking other orally administered medications for a least 24 hours The patient has no evidence of active gastrointestinal bleeding or impaired GI absorption (gastrectomy, short bowel, patient on TNA or NPO).  If you have questions about this conversion, please contact the Pharmacy Department  []   [phone] )  [facility] []   [phone] )  [facility] []   [phone] )  [facility] []   [phone] )  [facility] [x]   [phone] )  [place], PharmD, [place]: [phone] 12/08/2021 10:44 AM

## 2021-12-08 NOTE — Progress Notes (Signed)
°   12/08/21 1300  Mobility  Activity Ambulated in hall  Level of Assistance Contact guard assist, steadying assist  Assistive Device Front wheel walker  Distance Ambulated (ft) 50 ft  Mobility Ambulated with assistance in hallway  Mobility Response Tolerated well  Mobility performed by Mobility specialist  $Mobility charge 1 Mobility   Pt agreeable to mobilize this afternoon. Requested to use the bathroom prior to session. RN also requested to give pt medication prior to session. Once completed, pt ambulated about 52ft in hall with RW, tolerated well. She noted some pain at her surgical site(s), otherwise no complaints. Left pt in bed with call bell at side. RN notified of session.  [place] Specialist Acute Rehab Services Office: [phone]

## 2021-12-08 NOTE — Progress Notes (Signed)
PROGRESS NOTE  Judith [name] FTD:[id number] DOB: 09-20-1941 DOA: 11/28/2021 PCP: Janie Morning, DO   LOS: 9 days   Brief narrative:  Patient is 80 years old female with past medical history of diabetes, hypertension, peripheral neuropathy, gout presented to hospital with abdominal pain nausea and vomiting.  In the ED, patient had a CT scan which showed cholelithiasis and choledocholithiasis.  GI was consulted and patient underwent ERCP on 11/30/2021.  Patient did have extremely difficult ERCP.  General surgery was consulted and the patient underwent surgical intervention on 12/06/2021.  Assessment/Plan:  Principal Problem:   Choledocholithiasis Active Problems:   Peripheral neuropathy   Benign essential HTN   Presence of cardiac pacemaker   Gout   Type 2 diabetes mellitus with hyperglycemia (HCC)  Acute gangrenous cholecystitis.  Patient underwent  laparoscopic cholecystectomy on 12/06/2021 with findings of gangrenous cholecystitis and underwent subtotal cholecystectomy.  Drain was placed in. Persisting leukocytosis. We will follow surgical recommendations.  On IV Rocephin at this time.  General surgery has seen the patient today and recommend okay from their side for disposition  Choledocholithiasis. Patient underwent difficult ERCP followed by biliary stent placement on 11/30/2021.  Plan is to follow-up with GI in 6 to 10 weeks for stent removal sphincterectomy extension and stone removal.    Type 2 diabetes mellitus with hyperglycemia. Currently on sliding scale insulin and long-acting insulin.  Continue to monitor blood glucose levels.  Latest hemoglobin A1c of 7.9.  Overall glycemic control is adequate.  TPOC glucose of 143  History of gout. Continue colchicine.  Benign essential hypertension. Continue Coreg and losartan.    Peripheral neuropathy. Continue gabapentin and duloxetine  Mild hypokalemia.  We will replace again today.  Potassium 3.4.    Hypomagnesemia.   Replenished.  Magnesium 1.8.  History of complete heart block status post pacemaker placement.  Disposition.  Patient is from [facility] assisted [facility] facility.  Physical therapy recommending skilled nursing level of care.  TOC has been consulted.  DVT prophylaxis: enoxaparin (LOVENOX) injection 40 mg Start: 12/07/21 1200 Place and maintain sequential compression device Start: 12/03/21 1840 SCDs Start: 11/28/21 1643   Code Status: Full code  Family Communication:   Spoke with the patient's daughter at bedside 12/08/2021.  She is inquiring about skilled nursing facility placement.  Status is: Inpatient  Remains inpatient appropriate because: Status postcholecystectomy, leukocytosis, electrolyte imbalance, need for skilled nursing facility placement  Consultants: GI General surgery  Procedures: ERCP with stent placement on 11/30/2021 Laparoscopic subtotal cholecystectomy on 12/06/2021.  Anti-infectives:  Rocephin IV.  Anti-infectives (From admission, onward)    Start     Dose/Rate Route Frequency Ordered Stop   12/06/21 2000  cefTRIAXone (ROCEPHIN) 2 g in sodium chloride 0.9 % 100 mL IVPB       Note to Pharmacy: Pharmacy may adjust dosing strength / duration / interval for maximal efficacy   2 g 200 mL/hr over 30 Minutes Intravenous Every 24 hours 12/06/21 1608     12/06/21 1252  ceFAZolin (ANCEF) 2-4 GM/100ML-% IVPB       Note to Pharmacy: Dellie Catholic: cabinet override      12/06/21 1252 12/07/21 0059   12/05/21 1030  ciprofloxacin (CIPRO) IVPB 400 mg  Status:  Discontinued        400 mg 200 mL/hr over 60 Minutes Intravenous 2 times daily 12/05/21 0935 12/05/21 1505   11/30/21 1415  ciprofloxacin (CIPRO) IVPB 400 mg        400 mg 200 mL/hr over 60 Minutes Intravenous  Once 11/30/21 1402 11/30/21 1501       Subjective: Today, patient was seen and examined at bedside.  Patient denies overt pain nausea vomiting fever or chills.  Has not had a bowel movement since  her surgery.   Objective: Vitals:   12/07/21 2016 12/08/21 0519  BP: 129/64 (!) 166/67  Pulse: 70 68  Resp:    Temp: 97.7 F (36.5 C) 97.8 F (36.6 C)  SpO2: 94% 93%    Intake/Output Summary (Last 24 hours) at 12/08/2021 0800 Last data filed at 12/08/2021 0500 Gross per 24 hour  Intake 2120 ml  Output 120 ml  Net 2000 ml    Filed Weights   11/28/21 1348 12/06/21 1138  Weight: 59 kg 59 kg   Body mass index is 23.04 kg/m.   Physical Exam:  General:  Average built, not in obvious distress HENT:   No scleral pallor or icterus noted. Oral mucosa is moist.  Chest:  Clear breath sounds.  Diminished breath sounds bilaterally. No crackles or wheezes.  CVS: S1 &S2 heard. No murmur.  Regular rate and rhythm. Abdomen: Soft, epigastric and right upper quadrant tenderness appropriate nondistended.  Bowel sounds are heard.  Status post laparoscopic surgery with right upper quadrant drain with dark red output Extremities: No cyanosis, clubbing or edema.  Peripheral pulses are palpable. Psych: Alert, awake and oriented, normal mood CNS:  No cranial nerve deficits.  Power equal in all extremities.   Skin: Warm and dry.  No rashes noted.  Data Review: I have personally reviewed the following laboratory data and studies,  CBC: Recent Labs  Lab 12/02/21 0528 12/05/21 0804 12/06/21 0448 12/07/21 0443 12/08/21 0447  WBC 11.1* 12.6* 11.4* 23.4* 19.0*  HGB 10.4* 11.3* 11.9* 12.8 11.5*  HCT 31.9* 34.3* 36.2 38.6 35.5*  MCV 93.8 92.7 93.1 93.9 94.9  PLT 180 190 229 299 540    Basic Metabolic Panel: Recent Labs  Lab 12/04/21 0448 12/05/21 0804 12/06/21 0448 12/07/21 0443 12/08/21 0447  NA 135 135 135 132* 130*  K 3.4* 3.7 3.2* 4.0 3.4*  CL 98 96* 95* 92* 91*  CO2 28 29 29 24 28   GLUCOSE 95 123* 118* 164* 152*  BUN 8 8 5* 9 10  CREATININE 0.72 0.80 0.70 0.82 0.77  CALCIUM 8.1* 8.2* 8.7* 8.0* 7.6*  MG  --   --   --  1.2* 1.8    Liver Function Tests: Recent Labs  Lab  12/03/21 0530 12/04/21 0448 12/05/21 0804 12/06/21 0448 12/08/21 0447  AST 573* 173* 59* 46* 78*  ALT 223* 132* 80* 64* 46*  ALKPHOS 312* 243* 215* 208* 155*  BILITOT 3.3* 3.2* 2.0* 1.5* 1.2  PROT 5.6* 5.6* 5.2* 5.6* 5.8*  ALBUMIN 3.1* 3.0* 2.7* 2.8* 2.9*    Recent Labs  Lab 12/02/21 0528  LIPASE 22    No results for input(s): AMMONIA in the last 168 hours. Cardiac Enzymes: No results for input(s): CKTOTAL, CKMB, CKMBINDEX, TROPONINI in the last 168 hours. BNP (last 3 results) No results for input(s): BNP in the last 8760 hours.  ProBNP (last 3 results) No results for input(s): PROBNP in the last 8760 hours.  CBG: Recent Labs  Lab 12/07/21 0746 12/07/21 1128 12/07/21 1624 12/07/21 2110 12/08/21 0741  GLUCAP 175* 176* 223* 186* 153*    Recent Results (from the past 240 hour(s))  Resp Panel by RT-PCR (Flu A&B, Covid) Nasopharyngeal Swab     Status: None   Collection Time: 12/03/21  9:32 AM  Specimen: Nasopharyngeal Swab; Nasopharyngeal(NP) swabs in vial transport medium  Result Value Ref Range Status   SARS Coronavirus 2 by RT PCR NEGATIVE NEGATIVE Final    Comment: (NOTE) SARS-CoV-2 target nucleic acids are NOT DETECTED.  The SARS-CoV-2 RNA is generally detectable in upper respiratory specimens during the acute phase of infection. The lowest concentration of SARS-CoV-2 viral copies this assay can detect is 138 copies/mL. A negative result does not preclude SARS-Cov-2 infection and should not be used as the sole basis for treatment or other patient management decisions. A negative result may occur with  improper specimen collection/handling, submission of specimen other than nasopharyngeal swab, presence of viral mutation(s) within the areas targeted by this assay, and inadequate number of viral copies(<138 copies/mL). A negative result must be combined with clinical observations, patient history, and epidemiological information. The expected result is  Negative.  Fact Sheet for Patients:  EntrepreneurPulse.com.au  Fact Sheet for Healthcare Providers:  IncredibleEmployment.be  This test is no t yet approved or cleared by the [place] FDA and  has been authorized for detection and/or diagnosis of SARS-CoV-2 by FDA under an Emergency Use Authorization (EUA). This EUA will remain  in effect (meaning this test can be used) for the duration of the COVID-19 declaration under Section 564(b)(1) of the Act, 21 U.S.C.section 360bbb-3(b)(1), unless the authorization is terminated  or revoked sooner.       Influenza A by PCR NEGATIVE NEGATIVE Final   Influenza B by PCR NEGATIVE NEGATIVE Final    Comment: (NOTE) The Xpert Xpress SARS-CoV-2/FLU/RSV plus assay is intended as an aid in the diagnosis of influenza from Nasopharyngeal swab specimens and should not be used as a sole basis for treatment. Nasal washings and aspirates are unacceptable for Xpert Xpress SARS-CoV-2/FLU/RSV testing.  Fact Sheet for Patients: EntrepreneurPulse.com.au  Fact Sheet for Healthcare Providers: IncredibleEmployment.be  This test is not yet approved or cleared by the [place] FDA and has been authorized for detection and/or diagnosis of SARS-CoV-2 by FDA under an Emergency Use Authorization (EUA). This EUA will remain in effect (meaning this test can be used) for the duration of the COVID-19 declaration under Section 564(b)(1) of the Act, 21 U.S.C. section 360bbb-3(b)(1), unless the authorization is terminated or revoked.  Performed at [facility], [place] [address] [place], [place] [address]       Studies: No results found.    Flora Lipps, MD  [facility] 12/08/2021  If 7PM-7AM, please contact night-coverage

## 2021-12-09 LAB — CBC
HCT: 33.6 % — ABNORMAL LOW (ref 36.0–46.0)
Hemoglobin: 11 g/dL — ABNORMAL LOW (ref 12.0–15.0)
MCH: 30.5 pg (ref 26.0–34.0)
MCHC: 32.7 g/dL (ref 30.0–36.0)
MCV: 93.1 fL (ref 80.0–100.0)
Platelets: 304 10*3/uL (ref 150–400)
RBC: 3.61 MIL/uL — ABNORMAL LOW (ref 3.87–5.11)
RDW: 15 % (ref 11.5–15.5)
WBC: 14.8 10*3/uL — ABNORMAL HIGH (ref 4.0–10.5)
nRBC: 0 % (ref 0.0–0.2)

## 2021-12-09 LAB — BASIC METABOLIC PANEL
Anion gap: 10 (ref 5–15)
BUN: 6 mg/dL — ABNORMAL LOW (ref 8–23)
CO2: 27 mmol/L (ref 22–32)
Calcium: 7.7 mg/dL — ABNORMAL LOW (ref 8.9–10.3)
Chloride: 95 mmol/L — ABNORMAL LOW (ref 98–111)
Creatinine, Ser: 0.69 mg/dL (ref 0.44–1.00)
GFR, Estimated: >60 mL/min (ref >60)
Glucose, Bld: 78 mg/dL (ref 70–99)
Potassium: 3.5 mmol/L (ref 3.5–5.1)
Sodium: 132 mmol/L — ABNORMAL LOW (ref 135–145)

## 2021-12-09 LAB — GLUCOSE, CAPILLARY
Glucose-Capillary: 78 mg/dL (ref 70–99)
Glucose-Capillary: 157 mg/dL — ABNORMAL HIGH (ref 70–99)
Glucose-Capillary: 147 mg/dL — ABNORMAL HIGH (ref 70–99)
Glucose-Capillary: 125 mg/dL — ABNORMAL HIGH (ref 70–99)

## 2021-12-09 LAB — MAGNESIUM: Magnesium: 1.4 mg/dL — ABNORMAL LOW (ref 1.7–2.4)

## 2021-12-09 MED ORDER — MAGNESIUM OXIDE -MG SUPPLEMENT 400 (240 MG) MG PO TABS
400.0000 mg | ORAL_TABLET | Freq: Two times a day (BID) | ORAL | Status: DC
  Administered 2021-12-09 – 2021-12-11 (×5): 400 mg via ORAL
  Filled 2021-12-09 (×5): qty 1

## 2021-12-09 MED ORDER — POTASSIUM CHLORIDE CRYS ER 20 MEQ PO TBCR
40.0000 meq | EXTENDED_RELEASE_TABLET | Freq: Once | ORAL | Status: AC
  Administered 2021-12-09: 40 meq via ORAL
  Filled 2021-12-09: qty 2

## 2021-12-09 MED ORDER — MAGNESIUM SULFATE 2 GM/50ML IV SOLN
2.0000 g | Freq: Once | INTRAVENOUS | Status: AC
  Administered 2021-12-09: 2 g via INTRAVENOUS
  Filled 2021-12-09: qty 50

## 2021-12-09 NOTE — Plan of Care (Signed)
°  Problem: Clinical Measurements: Goal: Ability to maintain clinical measurements within normal limits will improve Outcome: Progressing Goal: Will remain free from infection Outcome: Progressing   Problem: Activity: Goal: Risk for activity intolerance will decrease Outcome: Progressing   Problem: Elimination: Goal: Will not experience complications related to bowel motility Outcome: Progressing   Problem: Pain Managment: Goal: General experience of comfort will improve Outcome: Progressing

## 2021-12-09 NOTE — Progress Notes (Signed)
PROGRESS NOTE  Judith [name] ZOX:[id number] DOB: March 26, 1941 DOA: 11/28/2021 PCP: Janie Morning, DO   LOS: 10 days   Brief narrative:  Patient is 80 years old female with past medical history of diabetes, hypertension, peripheral neuropathy, gout presented to hospital with abdominal pain nausea and vomiting.  In the ED, patient had a CT scan which showed cholelithiasis and choledocholithiasis.  GI was consulted and patient underwent ERCP on 11/30/2021.  Patient did have extremely difficult ERCP.  General surgery was consulted and the patient underwent surgical intervention on 12/06/2021.  Assessment/Plan:  Principal Problem:   Choledocholithiasis Active Problems:   Peripheral neuropathy   Benign essential HTN   Presence of cardiac pacemaker   Gout   Type 2 diabetes mellitus with hyperglycemia (HCC)  Acute gangrenous cholecystitis.  Patient underwent  laparoscopic  subtotoal cholecystectomy on 12/06/2021 with findings of gangrenous cholecystitis. Drain was placed in a.m. could be discontinued prior to discharge if low output.   On IV Rocephin at this time and plan is total of 5-day after surgery.  Could be changed to oral on discharge general surgery following.  Follow surgical recommendations.  Mild leukocytosis but trending down.  Patient is however afebrile.  Choledocholithiasis. Patient underwent difficult ERCP followed by biliary stent placement on 11/30/2021.  Plan is to follow-up with GI in 6 to 10 weeks for stent removal sphincterectomy extension and stone removal.  Plan for repeat ERCP on 02/01/2022.  Type 2 diabetes mellitus with hyperglycemia. Currently on sliding scale insulin and long-acting insulin.   Latest hemoglobin A1c of 7.9.  Overall glycemic control is adequate.  Last POC glucose of 78.  History of gout. Continue colchicine.  Benign essential hypertension. Continue Coreg and losartan.    Peripheral neuropathy. Continue gabapentin and duloxetine  Mild hypokalemia.   Improved after replacement.  Latest potassium 3.5.  We will give 1 dose of p.o. potassium today for  Hypomagnesemia.  Magnesium 1.4 today.  We will replenish through IV and add magnesium oxide as appropriate  History of complete heart block status post pacemaker placement.  Disposition.  Patient is from [facility] assisted [facility] facility.  Physical therapy recommends skilled nursing level of care.  TOC has been consulted.  DVT prophylaxis: enoxaparin (LOVENOX) injection 40 mg Start: 12/07/21 1200 Place and maintain sequential compression device Start: 12/03/21 1840 SCDs Start: 11/28/21 1643   Code Status: Full code  Family Communication:   Spoke with the patient's daughter at bedside on 12/08/2021.    Status is: Inpatient  Remains inpatient appropriate because: Status postcholecystectomy, leukocytosis, electrolyte imbalances, need for skilled nursing facility placement  Consultants: GI General surgery  Procedures: ERCP with stent placement on 11/30/2021 Laparoscopic subtotal cholecystectomy on 12/06/2021.  Anti-infectives:  Rocephin IV.  Anti-infectives (From admission, onward)    Start     Dose/Rate Route Frequency Ordered Stop   12/06/21 2000  cefTRIAXone (ROCEPHIN) 2 g in sodium chloride 0.9 % 100 mL IVPB       Note to Pharmacy: Pharmacy may adjust dosing strength / duration / interval for maximal efficacy   2 g 200 mL/hr over 30 Minutes Intravenous Every 24 hours 12/06/21 1608     12/06/21 1252  ceFAZolin (ANCEF) 2-4 GM/100ML-% IVPB       Note to Pharmacy: Dellie Catholic: cabinet override      12/06/21 1252 12/07/21 0059   12/05/21 1030  ciprofloxacin (CIPRO) IVPB 400 mg  Status:  Discontinued        400 mg 200 mL/hr over 60 Minutes Intravenous  2 times daily 12/05/21 0935 12/05/21 1505   11/30/21 1415  ciprofloxacin (CIPRO) IVPB 400 mg        400 mg 200 mL/hr over 60 Minutes Intravenous  Once 11/30/21 1402 11/30/21 1501       Subjective: Today, patient was  seen and examined at bedside.  Patient has had a bowel movement.  No nausea vomiting has mild abdominal pain.   Objective: Vitals:   12/08/21 2033 12/09/21 0441  BP: (!) 152/79 (!) 167/84  Pulse: 69 84  Resp: 18 18  Temp: (!) 97.5 F (36.4 C) 98 F (36.7 C)  SpO2: 96% 95%    Intake/Output Summary (Last 24 hours) at 12/09/2021 0833 Last data filed at 12/09/2021 0451 Gross per 24 hour  Intake 3531.03 ml  Output 137 ml  Net 3394.03 ml    Filed Weights   11/28/21 1348 12/06/21 1138  Weight: 59 kg 59 kg   Body mass index is 23.04 kg/m.   Physical Exam:  General:  Average built, not in obvious distress HENT:   No scleral pallor or icterus noted. Oral mucosa is moist.  Chest:  Clear breath sounds.  Diminished breath sounds bilaterally.  CVS: S1 &S2 heard. No murmur.  Regular rate and rhythm. Abdomen: Soft, mild nonspecific tenderness noted, status post laparoscopic surgery with right upper quadrant drain Extremities: No cyanosis, clubbing or edema.  Peripheral pulses are palpable. Psych: Alert, awake and oriented, normal mood CNS:  No cranial nerve deficits.  Power equal in all extremities.   Skin: Warm and dry.  No rashes noted.  Data Review: I have personally reviewed the following laboratory data and studies,  CBC: Recent Labs  Lab 12/05/21 0804 12/06/21 0448 12/07/21 0443 12/08/21 0447 12/09/21 0538  WBC 12.6* 11.4* 23.4* 19.0* 14.8*  HGB 11.3* 11.9* 12.8 11.5* 11.0*  HCT 34.3* 36.2 38.6 35.5* 33.6*  MCV 92.7 93.1 93.9 94.9 93.1  PLT 190 229 299 259 932    Basic Metabolic Panel: Recent Labs  Lab 12/05/21 0804 12/06/21 0448 12/07/21 0443 12/08/21 0447 12/09/21 0538  NA 135 135 132* 130* 132*  K 3.7 3.2* 4.0 3.4* 3.5  CL 96* 95* 92* 91* 95*  CO2 29 29 24 28 27   GLUCOSE 123* 118* 164* 152* 78  BUN 8 5* 9 10 6*  CREATININE 0.80 0.70 0.82 0.77 0.69  CALCIUM 8.2* 8.7* 8.0* 7.6* 7.7*  MG  --   --  1.2* 1.8 1.4*    Liver Function Tests: Recent Labs   Lab 12/03/21 0530 12/04/21 0448 12/05/21 0804 12/06/21 0448 12/08/21 0447  AST 573* 173* 59* 46* 78*  ALT 223* 132* 80* 64* 46*  ALKPHOS 312* 243* 215* 208* 155*  BILITOT 3.3* 3.2* 2.0* 1.5* 1.2  PROT 5.6* 5.6* 5.2* 5.6* 5.8*  ALBUMIN 3.1* 3.0* 2.7* 2.8* 2.9*    No results for input(s): LIPASE, AMYLASE in the last 168 hours.  No results for input(s): AMMONIA in the last 168 hours. Cardiac Enzymes: No results for input(s): CKTOTAL, CKMB, CKMBINDEX, TROPONINI in the last 168 hours. BNP (last 3 results) No results for input(s): BNP in the last 8760 hours.  ProBNP (last 3 results) No results for input(s): PROBNP in the last 8760 hours.  CBG: Recent Labs  Lab 12/08/21 0741 12/08/21 1151 12/08/21 1620 12/08/21 2019 12/09/21 0734  GLUCAP 153* 166* 117* 116* 78    Recent Results (from the past 240 hour(s))  Resp Panel by RT-PCR (Flu A&B, Covid) Nasopharyngeal Swab  Status: None   Collection Time: 12/03/21  9:32 AM   Specimen: Nasopharyngeal Swab; Nasopharyngeal(NP) swabs in vial transport medium  Result Value Ref Range Status   SARS Coronavirus 2 by RT PCR NEGATIVE NEGATIVE Final    Comment: (NOTE) SARS-CoV-2 target nucleic acids are NOT DETECTED.  The SARS-CoV-2 RNA is generally detectable in upper respiratory specimens during the acute phase of infection. The lowest concentration of SARS-CoV-2 viral copies this assay can detect is 138 copies/mL. A negative result does not preclude SARS-Cov-2 infection and should not be used as the sole basis for treatment or other patient management decisions. A negative result may occur with  improper specimen collection/handling, submission of specimen other than nasopharyngeal swab, presence of viral mutation(s) within the areas targeted by this assay, and inadequate number of viral copies(<138 copies/mL). A negative result must be combined with clinical observations, patient history, and epidemiological information. The  expected result is Negative.  Fact Sheet for Patients:  EntrepreneurPulse.com.au  Fact Sheet for Healthcare Providers:  IncredibleEmployment.be  This test is no t yet approved or cleared by the [place] FDA and  has been authorized for detection and/or diagnosis of SARS-CoV-2 by FDA under an Emergency Use Authorization (EUA). This EUA will remain  in effect (meaning this test can be used) for the duration of the COVID-19 declaration under Section 564(b)(1) of the Act, 21 U.S.C.section 360bbb-3(b)(1), unless the authorization is terminated  or revoked sooner.       Influenza A by PCR NEGATIVE NEGATIVE Final   Influenza B by PCR NEGATIVE NEGATIVE Final    Comment: (NOTE) The Xpert Xpress SARS-CoV-2/FLU/RSV plus assay is intended as an aid in the diagnosis of influenza from Nasopharyngeal swab specimens and should not be used as a sole basis for treatment. Nasal washings and aspirates are unacceptable for Xpert Xpress SARS-CoV-2/FLU/RSV testing.  Fact Sheet for Patients: EntrepreneurPulse.com.au  Fact Sheet for Healthcare Providers: IncredibleEmployment.be  This test is not yet approved or cleared by the [place] FDA and has been authorized for detection and/or diagnosis of SARS-CoV-2 by FDA under an Emergency Use Authorization (EUA). This EUA will remain in effect (meaning this test can be used) for the duration of the COVID-19 declaration under Section 564(b)(1) of the Act, 21 U.S.C. section 360bbb-3(b)(1), unless the authorization is terminated or revoked.  Performed at [facility], [place] [address] [place], [place] [address]       Studies: No results found.   Flora Lipps, MD  [facility] 12/09/2021  If 7PM-7AM, please contact night-coverage

## 2021-12-09 NOTE — Progress Notes (Signed)
3 Days Post-Op   Subjective/Chief Complaint: Doing fine, tol diet, having bowel function   Objective: Vital signs in last 24 hours: Temp:  [97.5 F (36.4 C)-98 F (36.7 C)] 98 F (36.7 C) (12/17 0441) Pulse Rate:  [69-84] 84 (12/17 0441) Resp:  [18] 18 (12/17 0441) BP: (121-167)/(73-84) 167/84 (12/17 0441) SpO2:  [94 %-96 %] 95 % (12/17 0441) Last BM Date: 12/08/21  Intake/Output from previous day: 12/16 0701 - 12/17 0700 In: 3531 [I.V.:3431; IV Piggyback:100] Out: 137 [Drains:137] Intake/Output this shift: No intake/output data recorded.  Abd: Soft, ND, minimal tender, small hematoma midline incision. +BS, incisions without infection Drain with ss output  Lab Results:  Recent Labs    12/08/21 0447 12/09/21 0538  WBC 19.0* 14.8*  HGB 11.5* 11.0*  HCT 35.5* 33.6*  PLT 259 304   BMET Recent Labs    12/08/21 0447 12/09/21 0538  NA 130* 132*  K 3.4* 3.5  CL 91* 95*  CO2 28 27  GLUCOSE 152* 78  BUN 10 6*  CREATININE 0.77 0.69  CALCIUM 7.6* 7.7*   PT/INR No results for input(s): LABPROT, INR in the last 72 hours. ABG No results for input(s): PHART, HCO3 in the last 72 hours.  Invalid input(s): PCO2, PO2  Studies/Results: No results found.  Anti-infectives: Anti-infectives (From admission, onward)    Start     Dose/Rate Route Frequency Ordered Stop   12/06/21 2000  cefTRIAXone (ROCEPHIN) 2 g in sodium chloride 0.9 % 100 mL IVPB       Note to Pharmacy: Pharmacy may adjust dosing strength / duration / interval for maximal efficacy   2 g 200 mL/hr over 30 Minutes Intravenous Every 24 hours 12/06/21 1608     12/06/21 1252  ceFAZolin (ANCEF) 2-4 GM/100ML-% IVPB       Note to Pharmacy: Dellie Catholic: cabinet override      12/06/21 1252 12/07/21 0059   12/05/21 1030  ciprofloxacin (CIPRO) IVPB 400 mg  Status:  Discontinued        400 mg 200 mL/hr over 60 Minutes Intravenous 2 times daily 12/05/21 0935 12/05/21 1505   11/30/21 1415  ciprofloxacin (CIPRO)  IVPB 400 mg        400 mg 200 mL/hr over 60 Minutes Intravenous  Once 11/30/21 1402 11/30/21 1501       Assessment/Plan:  POD 3 s/p Laparoscopic subtotal cholecystectomy with drain placement for gangrenous cholecystitis - Dr. Brantley Stage, 12/06/2021 - Patient is s/p ERCP pre-op, 11/30/2021, Biliary Fistulotomy and CBD stent placement by Dr. Rush Landmark. GI has already arranged follow up for repeat ERCP on 02/01/22 per notes - Cont abx. 5d post op this can be po if she is able to be discharged - Cont drain. Can d/c prior to discharge if output remains non-bilious - Mobilize. PT. Rec SNF - Pulm toilet - Surgical stable for discharge to SNF when bed available. Will arrange follow up.   FEN: CM, IVF per [facility] ID: Rocephin 12/14 >>  VTE: SCDs, Lovenox   Complete heart block status post pacemaker placement Hypertension Hyperlipidemia Diabetes mellitus -A1c 7.9  Rolm Bookbinder 12/09/2021

## 2021-12-09 NOTE — Progress Notes (Signed)
°   12/09/21 1400  Mobility  Activity Ambulated in hall  Level of Assistance Contact guard assist, steadying assist  Assistive Device Front wheel walker  Distance Ambulated (ft) 50 ft  Mobility Ambulated with assistance in hallway  Mobility Response Tolerated well  $Mobility charge 1 Mobility   Pt agreeable to mobilizing this afternoon. Ambulated about 58ft in hall with RW, tolerated well. She did state that she was not feeling her strongest today and wanted to keep the session short. Pt left in bed with call bell at side. RN notified of session.  Rodney Specialist Acute Rehab Services Office: [phone]

## 2021-12-10 LAB — COMPREHENSIVE METABOLIC PANEL
ALT: 26 U/L (ref 0–44)
AST: 25 U/L (ref 15–41)
Albumin: 2.8 g/dL — ABNORMAL LOW (ref 3.5–5.0)
Alkaline Phosphatase: 128 U/L — ABNORMAL HIGH (ref 38–126)
Anion gap: 6 (ref 5–15)
BUN: 5 mg/dL — ABNORMAL LOW (ref 8–23)
CO2: 30 mmol/L (ref 22–32)
Calcium: 8.3 mg/dL — ABNORMAL LOW (ref 8.9–10.3)
Chloride: 96 mmol/L — ABNORMAL LOW (ref 98–111)
Creatinine, Ser: 0.69 mg/dL (ref 0.44–1.00)
GFR, Estimated: >60 mL/min (ref >60)
Glucose, Bld: 89 mg/dL (ref 70–99)
Potassium: 3.9 mmol/L (ref 3.5–5.1)
Sodium: 132 mmol/L — ABNORMAL LOW (ref 135–145)
Total Bilirubin: 1 mg/dL (ref 0.3–1.2)
Total Protein: 6 g/dL — ABNORMAL LOW (ref 6.5–8.1)

## 2021-12-10 LAB — GLUCOSE, CAPILLARY
Glucose-Capillary: 77 mg/dL (ref 70–99)
Glucose-Capillary: 93 mg/dL (ref 70–99)
Glucose-Capillary: 253 mg/dL — ABNORMAL HIGH (ref 70–99)
Glucose-Capillary: 131 mg/dL — ABNORMAL HIGH (ref 70–99)

## 2021-12-10 LAB — CBC
HCT: 36.6 % (ref 36.0–46.0)
Hemoglobin: 11.8 g/dL — ABNORMAL LOW (ref 12.0–15.0)
MCH: 30.3 pg (ref 26.0–34.0)
MCHC: 32.2 g/dL (ref 30.0–36.0)
MCV: 93.8 fL (ref 80.0–100.0)
Platelets: 377 10*3/uL (ref 150–400)
RBC: 3.9 MIL/uL (ref 3.87–5.11)
RDW: 15.2 % (ref 11.5–15.5)
WBC: 12.6 10*3/uL — ABNORMAL HIGH (ref 4.0–10.5)
nRBC: 0 % (ref 0.0–0.2)

## 2021-12-10 LAB — MAGNESIUM: Magnesium: 1.6 mg/dL — ABNORMAL LOW (ref 1.7–2.4)

## 2021-12-10 MED ORDER — ENSURE ENLIVE PO LIQD
237.0000 mL | Freq: Two times a day (BID) | ORAL | Status: DC
  Administered 2021-12-10 – 2021-12-11 (×4): 237 mL via ORAL

## 2021-12-10 MED ORDER — MAGNESIUM SULFATE 2 GM/50ML IV SOLN
2.0000 g | Freq: Once | INTRAVENOUS | Status: AC
  Administered 2021-12-10: 11:00:00 2 g via INTRAVENOUS
  Filled 2021-12-10: qty 50

## 2021-12-10 NOTE — Progress Notes (Signed)
4 Days Post-Op   Subjective/Chief Complaint: Tol diet, no n/v await placement   Objective: Vital signs in last 24 hours: Temp:  [97.8 F (36.6 C)-99.3 F (37.4 C)] 99.3 F (37.4 C) (12/17 1940) Pulse Rate:  [68-79] 70 (12/18 0747) Resp:  [18] 18 (12/17 1940) BP: (160-164)/(78-79) 160/78 (12/17 1940) SpO2:  [97 %-99 %] 97 % (12/17 1940) Last BM Date: 12/09/21  Intake/Output from previous day: 12/17 0701 - 12/18 0700 In: 2870.3 [P.O.:480; I.V.:2290.3; IV Piggyback:100.1] Out: 3540 [Urine:3500; Drains:40] Intake/Output this shift: No intake/output data recorded.  Abd: Soft, ND, minimal tender, small hematoma midline incision. +BS, incisions without infection drain with ss output  Lab Results:  Recent Labs    12/08/21 0447 12/09/21 0538  WBC 19.0* 14.8*  HGB 11.5* 11.0*  HCT 35.5* 33.6*  PLT 259 304   BMET Recent Labs    12/08/21 0447 12/09/21 0538  NA 130* 132*  K 3.4* 3.5  CL 91* 95*  CO2 28 27  GLUCOSE 152* 78  BUN 10 6*  CREATININE 0.77 0.69  CALCIUM 7.6* 7.7*   PT/INR No results for input(s): LABPROT, INR in the last 72 hours. ABG No results for input(s): PHART, HCO3 in the last 72 hours.  Invalid input(s): PCO2, PO2  Studies/Results: No results found.  Anti-infectives: Anti-infectives (From admission, onward)    Start     Dose/Rate Route Frequency Ordered Stop   12/06/21 2000  cefTRIAXone (ROCEPHIN) 2 g in sodium chloride 0.9 % 100 mL IVPB       Note to Pharmacy: Pharmacy may adjust dosing strength / duration / interval for maximal efficacy   2 g 200 mL/hr over 30 Minutes Intravenous Every 24 hours 12/06/21 1608     12/06/21 1252  ceFAZolin (ANCEF) 2-4 GM/100ML-% IVPB       Note to Pharmacy: Dellie Catholic: cabinet override      12/06/21 1252 12/07/21 0059   12/05/21 1030  ciprofloxacin (CIPRO) IVPB 400 mg  Status:  Discontinued        400 mg 200 mL/hr over 60 Minutes Intravenous 2 times daily 12/05/21 0935 12/05/21 1505   11/30/21 1415   ciprofloxacin (CIPRO) IVPB 400 mg        400 mg 200 mL/hr over 60 Minutes Intravenous  Once 11/30/21 1402 11/30/21 1501       Assessment/Plan: POD 4 s/p Laparoscopic subtotal cholecystectomy with drain placement for gangrenous cholecystitis - Dr. Brantley Stage, 12/06/2021 - Patient is s/p ERCP pre-op, 11/30/2021, Biliary Fistulotomy and CBD stent placement by Dr. Rush Landmark. GI has already arranged follow up for repeat ERCP on 02/01/22 per notes - Cont abx. 5d post op this can be po if she is able to be discharged - Cont drain. Can d/c prior to discharge if output remains non-bilious - Mobilize. PT. Rec SNF - Pulm toilet - Surgical stable for discharge to SNF when bed available. Will arrange follow up.   FEN: CM, IVF per [facility] ID: Rocephin 12/14 >>  VTE: SCDs, Lovenox   Complete heart block status post pacemaker placement Hypertension Hyperlipidemia Diabetes mellitus -A1c 7.9  Rolm Bookbinder 12/10/2021

## 2021-12-10 NOTE — Plan of Care (Signed)
  Problem: Clinical Measurements: Goal: Will remain free from infection Outcome: Progressing   Problem: Activity: Goal: Risk for activity intolerance will decrease Outcome: Progressing   Problem: Safety: Goal: Ability to remain free from injury will improve Outcome: Progressing   

## 2021-12-10 NOTE — Progress Notes (Signed)
PROGRESS NOTE  Judith [name] OAC:[id number] DOB: [birth year] DOA: 11/28/2021 PCP: Janie Morning, DO   LOS: 11 days   Brief narrative:  Patient is 80 years old female with past medical history of diabetes, hypertension, peripheral neuropathy, gout presented to hospital with abdominal pain nausea and vomiting.  In the ED, patient had a CT scan which showed cholelithiasis and choledocholithiasis.  GI was consulted and patient underwent ERCP on 11/30/2021.  Patient did have extremely difficult ERCP.  General surgery was consulted and the patient underwent surgical intervention on 12/06/2021.  Assessment/Plan:  Principal Problem:   Choledocholithiasis Active Problems:   Peripheral neuropathy   Benign essential HTN   Presence of cardiac pacemaker   Gout   Type 2 diabetes mellitus with hyperglycemia (HCC)  Acute gangrenous cholecystitis.  Patient underwent  laparoscopic  subtotoal cholecystectomy on 12/06/2021 with findings of gangrenous cholecystitis. Drain was placed in a.m. could be discontinued prior to discharge if low output.   On IV Rocephin at this time and plan is total of 5-day after surgery.  Could be changed to oral on discharge general surgery following.  Mild leukocytosis but trending down.  Afebrile.  Patient is clinically improving.  Tolerating diet.  Choledocholithiasis. Patient underwent difficult ERCP followed by biliary stent placement on 11/30/2021.  Plan is to follow-up with GI in 6 to 10 weeks for stent removal sphincterectomy extension and stone removal.  Plan for repeat ERCP on 02/01/2022.  Type 2 diabetes mellitus with hyperglycemia. Currently on sliding scale insulin and long-acting insulin.   Latest hemoglobin A1c of 7.9.  Overall glycemic control is adequate.  Last POC glucose of 77.  History of gout. Continue colchicine.  Benign essential hypertension. Continue Coreg and losartan.    Peripheral neuropathy. Continue gabapentin and duloxetine  Mild hypokalemia.   Improved after replacement.  Latest potassium was 3.9.   Hypomagnesemia.  Magnesium 1.6 today.  We will replenish through IV and add magnesium oxide  History of complete heart block status post pacemaker placement.  Disposition.  Patient is from [facility] assisted [facility] facility.  Physical therapy has recommended skilled nursing level of care.  TOC has been consulted.  DVT prophylaxis: enoxaparin (LOVENOX) injection 40 mg Start: 12/07/21 1200 Place and maintain sequential compression device Start: 12/03/21 1840 SCDs Start: 11/28/21 1643   Code Status: Full code  Family Communication:   Spoke with the patient's daughter at bedside on 12/08/2021.    Status is: Inpatient  Remains inpatient appropriate because: Status postcholecystectomy, need for skilled nursing facility placement  Consultants: GI General surgery  Procedures: ERCP with stent placement on 11/30/2021 Laparoscopic subtotal cholecystectomy on 12/06/2021.  Anti-infectives:  Rocephin IV 12/14>.  Anti-infectives (From admission, onward)    Start     Dose/Rate Route Frequency Ordered Stop   12/06/21 2000  cefTRIAXone (ROCEPHIN) 2 g in sodium chloride 0.9 % 100 mL IVPB       Note to Pharmacy: Pharmacy may adjust dosing strength / duration / interval for maximal efficacy   2 g 200 mL/hr over 30 Minutes Intravenous Every 24 hours 12/06/21 1608     12/06/21 1252  ceFAZolin (ANCEF) 2-4 GM/100ML-% IVPB       Note to Pharmacy: Dellie Catholic: cabinet override      12/06/21 1252 12/07/21 0059   12/05/21 1030  ciprofloxacin (CIPRO) IVPB 400 mg  Status:  Discontinued        400 mg 200 mL/hr over 60 Minutes Intravenous 2 times daily 12/05/21 0935 12/05/21 1505   11/30/21 1415  ciprofloxacin (CIPRO) IVPB 400 mg        400 mg 200 mL/hr over 60 Minutes Intravenous  Once 11/30/21 1402 11/30/21 1501       Subjective: Today, patient was seen and examined at bedside.  Patient denies any nausea, vomiting, fever or chills.   Has had a bowel movement yesterday.     Objective: Vitals:   12/09/21 1940 12/10/21 0747  BP: (!) 160/78   Pulse: 68 70  Resp: 18   Temp: 99.3 F (37.4 C)   SpO2: 97%     Intake/Output Summary (Last 24 hours) at 12/10/2021 0917 Last data filed at 12/10/2021 0656 Gross per 24 hour  Intake 2870.32 ml  Output 3540 ml  Net -669.68 ml    Filed Weights   11/28/21 1348 12/06/21 1138  Weight: 59 kg 59 kg   Body mass index is 23.04 kg/m.   Physical Exam:  General:  Average built, not in obvious distress HENT:   No scleral pallor or icterus noted. Oral mucosa is moist.  Chest:  Clear breath sounds.  Diminished breath sounds bilaterally. No crackles or wheezes.  CVS: S1 &S2 heard. No murmur.  Regular rate and rhythm. Abdomen: Soft, nontender, nondistended.  Status post laparoscopic surgery with right upper quadrant drain.  Bowel sounds are heard.   Extremities: No cyanosis, clubbing or edema.  Peripheral pulses are palpable. Psych: Alert, awake and oriented, normal mood CNS:  No cranial nerve deficits.  Power equal in all extremities.   Skin: Warm and dry.  No rashes noted.  Data Review: I have personally reviewed the following laboratory data and studies,  CBC: Recent Labs  Lab 12/06/21 0448 12/07/21 0443 12/08/21 0447 12/09/21 0538 12/10/21 0824  WBC 11.4* 23.4* 19.0* 14.8* 12.6*  HGB 11.9* 12.8 11.5* 11.0* 11.8*  HCT 36.2 38.6 35.5* 33.6* 36.6  MCV 93.1 93.9 94.9 93.1 93.8  PLT 229 299 259 304 287    Basic Metabolic Panel: Recent Labs  Lab 12/06/21 0448 12/07/21 0443 12/08/21 0447 12/09/21 0538 12/10/21 0824  NA 135 132* 130* 132* 132*  K 3.2* 4.0 3.4* 3.5 3.9  CL 95* 92* 91* 95* 96*  CO2 29 24 28 27 30   GLUCOSE 118* 164* 152* 78 89  BUN 5* 9 10 6* 5*  CREATININE 0.70 0.82 0.77 0.69 0.69  CALCIUM 8.7* 8.0* 7.6* 7.7* 8.3*  MG  --  1.2* 1.8 1.4* 1.6*    Liver Function Tests: Recent Labs  Lab 12/04/21 0448 12/05/21 0804 12/06/21 0448  12/08/21 0447 12/10/21 0824  AST 173* 59* 46* 78* 25  ALT 132* 80* 64* 46* 26  ALKPHOS 243* 215* 208* 155* 128*  BILITOT 3.2* 2.0* 1.5* 1.2 1.0  PROT 5.6* 5.2* 5.6* 5.8* 6.0*  ALBUMIN 3.0* 2.7* 2.8* 2.9* 2.8*    No results for input(s): LIPASE, AMYLASE in the last 168 hours.  No results for input(s): AMMONIA in the last 168 hours. Cardiac Enzymes: No results for input(s): CKTOTAL, CKMB, CKMBINDEX, TROPONINI in the last 168 hours. BNP (last 3 results) No results for input(s): BNP in the last 8760 hours.  ProBNP (last 3 results) No results for input(s): PROBNP in the last 8760 hours.  CBG: Recent Labs  Lab 12/09/21 0734 12/09/21 1159 12/09/21 1640 12/09/21 2104 12/10/21 0747  GLUCAP 78 157* 147* 125* 77    Recent Results (from the past 240 hour(s))  Resp Panel by RT-PCR (Flu A&B, Covid) Nasopharyngeal Swab     Status: None   Collection Time:  12/03/21  9:32 AM   Specimen: Nasopharyngeal Swab; Nasopharyngeal(NP) swabs in vial transport medium  Result Value Ref Range Status   SARS Coronavirus 2 by RT PCR NEGATIVE NEGATIVE Final    Comment: (NOTE) SARS-CoV-2 target nucleic acids are NOT DETECTED.  The SARS-CoV-2 RNA is generally detectable in upper respiratory specimens during the acute phase of infection. The lowest concentration of SARS-CoV-2 viral copies this assay can detect is 138 copies/mL. A negative result does not preclude SARS-Cov-2 infection and should not be used as the sole basis for treatment or other patient management decisions. A negative result may occur with  improper specimen collection/handling, submission of specimen other than nasopharyngeal swab, presence of viral mutation(s) within the areas targeted by this assay, and inadequate number of viral copies(<138 copies/mL). A negative result must be combined with clinical observations, patient history, and epidemiological information. The expected result is Negative.  Fact Sheet for Patients:   EntrepreneurPulse.com.au  Fact Sheet for Healthcare Providers:  IncredibleEmployment.be  This test is no t yet approved or cleared by the [place] FDA and  has been authorized for detection and/or diagnosis of SARS-CoV-2 by FDA under an Emergency Use Authorization (EUA). This EUA will remain  in effect (meaning this test can be used) for the duration of the COVID-19 declaration under Section 564(b)(1) of the Act, 21 U.S.C.section 360bbb-3(b)(1), unless the authorization is terminated  or revoked sooner.       Influenza A by PCR NEGATIVE NEGATIVE Final   Influenza B by PCR NEGATIVE NEGATIVE Final    Comment: (NOTE) The Xpert Xpress SARS-CoV-2/FLU/RSV plus assay is intended as an aid in the diagnosis of influenza from Nasopharyngeal swab specimens and should not be used as a sole basis for treatment. Nasal washings and aspirates are unacceptable for Xpert Xpress SARS-CoV-2/FLU/RSV testing.  Fact Sheet for Patients: EntrepreneurPulse.com.au  Fact Sheet for Healthcare Providers: IncredibleEmployment.be  This test is not yet approved or cleared by the [place] FDA and has been authorized for detection and/or diagnosis of SARS-CoV-2 by FDA under an Emergency Use Authorization (EUA). This EUA will remain in effect (meaning this test can be used) for the duration of the COVID-19 declaration under Section 564(b)(1) of the Act, 21 U.S.C. section 360bbb-3(b)(1), unless the authorization is terminated or revoked.  Performed at [facility], [place] [address] [place], [place] [address]       Studies: No results found.   Flora Lipps, MD  [facility] 12/10/2021  If 7PM-7AM, please contact night-coverage

## 2021-12-11 DIAGNOSIS — E1165 Type 2 diabetes mellitus with hyperglycemia: Secondary | ICD-10-CM | POA: Diagnosis not present

## 2021-12-11 DIAGNOSIS — R41841 Cognitive communication deficit: Secondary | ICD-10-CM | POA: Diagnosis not present

## 2021-12-11 DIAGNOSIS — R1310 Dysphagia, unspecified: Secondary | ICD-10-CM | POA: Diagnosis not present

## 2021-12-11 DIAGNOSIS — K810 Acute cholecystitis: Secondary | ICD-10-CM | POA: Diagnosis not present

## 2021-12-11 DIAGNOSIS — K5901 Slow transit constipation: Secondary | ICD-10-CM | POA: Diagnosis not present

## 2021-12-11 DIAGNOSIS — Z7401 Bed confinement status: Secondary | ICD-10-CM | POA: Diagnosis not present

## 2021-12-11 DIAGNOSIS — G629 Polyneuropathy, unspecified: Secondary | ICD-10-CM | POA: Diagnosis not present

## 2021-12-11 DIAGNOSIS — G8929 Other chronic pain: Secondary | ICD-10-CM | POA: Diagnosis not present

## 2021-12-11 DIAGNOSIS — R630 Anorexia: Secondary | ICD-10-CM | POA: Diagnosis not present

## 2021-12-11 DIAGNOSIS — R2681 Unsteadiness on feet: Secondary | ICD-10-CM | POA: Diagnosis not present

## 2021-12-11 DIAGNOSIS — M6259 Muscle wasting and atrophy, not elsewhere classified, multiple sites: Secondary | ICD-10-CM | POA: Diagnosis not present

## 2021-12-11 DIAGNOSIS — E0841 Diabetes mellitus due to underlying condition with diabetic mononeuropathy: Secondary | ICD-10-CM | POA: Diagnosis not present

## 2021-12-11 DIAGNOSIS — Z950 Presence of cardiac pacemaker: Secondary | ICD-10-CM | POA: Diagnosis not present

## 2021-12-11 DIAGNOSIS — M1000 Idiopathic gout, unspecified site: Secondary | ICD-10-CM | POA: Diagnosis not present

## 2021-12-11 DIAGNOSIS — E118 Type 2 diabetes mellitus with unspecified complications: Secondary | ICD-10-CM | POA: Diagnosis not present

## 2021-12-11 DIAGNOSIS — M6281 Muscle weakness (generalized): Secondary | ICD-10-CM | POA: Diagnosis not present

## 2021-12-11 DIAGNOSIS — K8050 Calculus of bile duct without cholangitis or cholecystitis without obstruction: Secondary | ICD-10-CM | POA: Diagnosis not present

## 2021-12-11 DIAGNOSIS — Z48816 Encounter for surgical aftercare following surgery on the genitourinary system: Secondary | ICD-10-CM | POA: Diagnosis not present

## 2021-12-11 DIAGNOSIS — E871 Hypo-osmolality and hyponatremia: Secondary | ICD-10-CM | POA: Diagnosis not present

## 2021-12-11 DIAGNOSIS — I10 Essential (primary) hypertension: Secondary | ICD-10-CM | POA: Diagnosis not present

## 2021-12-11 DIAGNOSIS — R531 Weakness: Secondary | ICD-10-CM | POA: Diagnosis not present

## 2021-12-11 DIAGNOSIS — M109 Gout, unspecified: Secondary | ICD-10-CM | POA: Diagnosis not present

## 2021-12-11 DIAGNOSIS — Z794 Long term (current) use of insulin: Secondary | ICD-10-CM | POA: Diagnosis not present

## 2021-12-11 LAB — BASIC METABOLIC PANEL
Anion gap: 9 (ref 5–15)
BUN: 6 mg/dL — ABNORMAL LOW (ref 8–23)
CO2: 26 mmol/L (ref 22–32)
Calcium: 8.5 mg/dL — ABNORMAL LOW (ref 8.9–10.3)
Chloride: 98 mmol/L (ref 98–111)
Creatinine, Ser: 0.59 mg/dL (ref 0.44–1.00)
GFR, Estimated: >60 mL/min (ref >60)
Glucose, Bld: 101 mg/dL — ABNORMAL HIGH (ref 70–99)
Potassium: 3.7 mmol/L (ref 3.5–5.1)
Sodium: 133 mmol/L — ABNORMAL LOW (ref 135–145)

## 2021-12-11 LAB — CBC
HCT: 35.4 % — ABNORMAL LOW (ref 36.0–46.0)
Hemoglobin: 11.8 g/dL — ABNORMAL LOW (ref 12.0–15.0)
MCH: 30.9 pg (ref 26.0–34.0)
MCHC: 33.3 g/dL (ref 30.0–36.0)
MCV: 92.7 fL (ref 80.0–100.0)
Platelets: 354 10*3/uL (ref 150–400)
RBC: 3.82 MIL/uL — ABNORMAL LOW (ref 3.87–5.11)
RDW: 14.9 % (ref 11.5–15.5)
WBC: 11.3 10*3/uL — ABNORMAL HIGH (ref 4.0–10.5)
nRBC: 0 % (ref 0.0–0.2)

## 2021-12-11 LAB — GLUCOSE, CAPILLARY
Glucose-Capillary: 98 mg/dL (ref 70–99)
Glucose-Capillary: 164 mg/dL — ABNORMAL HIGH (ref 70–99)

## 2021-12-11 MED ORDER — OXYCODONE HCL 5 MG PO TABS: 5.0000 mg | ORAL_TABLET | Freq: Four times a day (QID) | ORAL | 0 refills | Status: DC | PRN

## 2021-12-11 MED ORDER — POLYETHYLENE GLYCOL 3350 17 G PO PACK: 17.0000 g | PACK | Freq: Every day | ORAL | 0 refills | Status: DC | PRN

## 2021-12-11 MED ORDER — MORPHINE SULFATE (PF) 2 MG/ML IV SOLN: 2.0000 mg | INTRAVENOUS | Status: DC | PRN

## 2021-12-11 MED ORDER — POLYETHYLENE GLYCOL 3350 17 G PO PACK
17.0000 g | PACK | Freq: Every day | ORAL | Status: DC
  Filled 2021-12-11: qty 1

## 2021-12-11 MED ORDER — MAGNESIUM OXIDE -MG SUPPLEMENT 400 (240 MG) MG PO TABS: 400.0000 mg | ORAL_TABLET | Freq: Two times a day (BID) | ORAL | 0 refills | Status: AC

## 2021-12-11 MED ORDER — ENSURE ENLIVE PO LIQD: 237.0000 mL | Freq: Two times a day (BID) | ORAL | Status: DC

## 2021-12-11 MED ORDER — DOCUSATE SODIUM 100 MG PO CAPS: 100.0000 mg | ORAL_CAPSULE | Freq: Two times a day (BID) | ORAL | 0 refills | Status: DC | PRN

## 2021-12-11 MED ORDER — DOCUSATE SODIUM 100 MG PO CAPS
100.0000 mg | ORAL_CAPSULE | Freq: Two times a day (BID) | ORAL | Status: DC
  Administered 2021-12-11: 10:00:00 100 mg via ORAL
  Filled 2021-12-11: qty 1

## 2021-12-11 MED ORDER — OXYCODONE HCL 5 MG PO TABS: 5.0000 mg | ORAL_TABLET | Freq: Four times a day (QID) | ORAL | Status: DC | PRN

## 2021-12-11 NOTE — Plan of Care (Signed)
  Problem: Clinical Measurements: Goal: Will remain free from infection Outcome: Progressing   Problem: Activity: Goal: Risk for activity intolerance will decrease Outcome: Progressing   Problem: Pain Managment: Goal: General experience of comfort will improve Outcome: Progressing   Problem: Skin Integrity: Goal: Risk for impaired skin integrity will decrease Outcome: Progressing   

## 2021-12-11 NOTE — Discharge Summary (Signed)
Physician Discharge Summary  Judith [name] GEZ:[id number] DOB: July 25, 1941 DOA: 11/28/2021  PCP: Janie Morning, DO  Admit date: 11/28/2021 Discharge date: 12/11/2021  Admitted From: Assisted living facility  Discharge disposition: Skilled nursing facility  Recommendations for Outpatient Follow-Up:   Follow up with your primary care provider at the skilled nursing facility in 3 to 5 days Check CBC, CMP, magnesium in the next visit Follow-up with general surgery as has been scheduled. Patient has plans for repeat ERCP on 02/01/2022 as per GI.  This has been scheduled at 9:45 AM with [facility] gastroenterology.  Discharge Diagnosis:   Principal Problem:   Choledocholithiasis Active Problems:   Peripheral neuropathy   Benign essential HTN   Presence of cardiac pacemaker   Gout   Type 2 diabetes mellitus with hyperglycemia (HCC) Acute gangrenous cholecystitis  Discharge Condition: Improved.  Diet recommendation: Low sodium, heart healthy.  Carbohydrate-modified.    Wound care: Laparoscopic site care  Code status: Full.   History of Present Illness:   Patient is 80 years old female with past medical history of diabetes, hypertension, peripheral neuropathy, gout presented to hospital with abdominal pain nausea and vomiting.  In the ED, patient had a CT scan which showed cholelithiasis and choledocholithiasis.  GI was consulted and patient underwent ERCP on 11/30/2021.  Patient did have extremely difficult ERCP.  General surgery was consulted and the patient underwent surgical intervention on 12/06/2021.  Hospital Course:   Following conditions were addressed during hospitalization as listed below,  Acute gangrenous cholecystitis.  Patient underwent  laparoscopic  subtotoal cholecystectomy on 12/06/2021 with findings of gangrenous cholecystitis.  Drain was placed after surgery which will be removed on discharge.  Patient has completed a 5-day course of IV antibiotic postoperatively.   Patient has been seen by general surgery today and is stable for disposition to skilled nursing facility.    Choledocholithiasis. Patient underwent difficult ERCP followed by biliary stent placement on 11/30/2021.  Plan is to follow-up with GI in 6 to 10 weeks for stent removal sphincterectomy extension and stone removal.  Plan for repeat ERCP on 02/01/2022 as per GI.Marland Kitchen  Schedule has been made.   Type 2 diabetes mellitus with hyperglycemia. Patient is currently on sliding scale insulin and long-acting insulin at home this will be resumed on discharge.  History of gout. Continue colchicine.   Benign essential hypertension. Continue Coreg and losartan.     Peripheral neuropathy. Continue gabapentin and duloxetine   Mild hypokalemia.  Improved after replacement.  Latest potassium was 3.9.    Hypomagnesemia.  Will be given a 5-day course of magnesium oxide on discharge  History of complete heart block status post pacemaker placement  Disposition.  At this time, patient is stable for disposition to skilled nursing facility.  Patient will need to follow-up with surgery as has been scheduled and with GI which has been scheduled as appropriate  Medical Consultants:   GI General surgery  Procedures:    ERCP with stent placement on 11/30/2021 Laparoscopic subtotal cholecystectomy on 12/06/2021. Subjective:   Today, patient was seen and examined at bedside.  Patient denies any nausea vomiting or abdominal pain.  Feels constipated.  Discharge Exam:   Vitals:   12/11/21 0552 12/11/21 1002  BP: (!) 177/73 (!) 174/63  Pulse: 68 65  Resp: 18 16  Temp: 98.9 F (37.2 C) (!) 97.5 F (36.4 C)  SpO2: 95% 99%   Vitals:   12/10/21 0747 12/10/21 2053 12/11/21 0552 12/11/21 1002  BP:  (!) 182/75 Marland Kitchen)  177/73 (!) 174/63  Pulse: 70 65 68 65  Resp:  '20 18 16  ' Temp:  98.4 F (36.9 C) 98.9 F (37.2 C) (!) 97.5 F (36.4 C)  TempSrc:  Oral Oral Oral  SpO2:  98% 95% 99%  Weight:      Height:         General: Alert awake, not in obvious distress HENT: pupils equally reacting to light,  No scleral pallor or icterus noted. Oral mucosa is moist.  Chest:  Clear breath sounds.  Diminished breath sounds bilaterally. No crackles or wheezes.  CVS: S1 &S2 heard. No murmur.  Regular rate and rhythm. Abdomen: Soft, nontender, nondistended.  Bowel sounds are heard.  Status post laparoscopic surgery with right upper quadrant drain to be removed today. Extremities: No cyanosis, clubbing or edema.  Peripheral pulses are palpable. Psych: Alert, awake and oriented, normal mood CNS:  No cranial nerve deficits.  Power equal in all extremities.   Skin: Warm and dry.  No rashes noted.  The results of significant diagnostics from this hospitalization (including imaging, microbiology, ancillary and laboratory) are listed below for reference.     Diagnostic Studies:   CT ABDOMEN PELVIS W CONTRAST  Result Date: 11/28/2021 CLINICAL DATA:  Nonlocalized acute abdominal pain. EXAM: CT ABDOMEN AND PELVIS WITH CONTRAST TECHNIQUE: Multidetector CT imaging of the abdomen and pelvis was performed using the standard protocol following bolus administration of intravenous contrast. CONTRAST:  62m OMNIPAQUE IOHEXOL 350 MG/ML SOLN COMPARISON:  None. FINDINGS: Lower chest: Partially visualized cardiac leads. Query mitral annular calcifications. No acute abnormality. Tiny hiatal hernia. Hepatobiliary: No focal liver abnormality. Calcified gallstone noted within the gallbladder lumen. No gallbladder wall thickening or pericholecystic fluid. The main pancreatic duct is enlarged in caliber measuring up to 1.3 cm with several gallstone noted within its lumen (largest measuring up to 1.1 cm). Pancreas: No focal lesion. Normal pancreatic contour. No surrounding inflammatory changes. No main pancreatic ductal dilatation. Spleen: Normal in size without focal abnormality. Adrenals/Urinary Tract: No adrenal nodule bilaterally. Bilateral  kidneys enhance symmetrically. Bilateral renal cortical scarring. Fluid density lesion within left kidney likely represents a simple renal cyst and measures up to 1.8 cm. Subcentimeter hypodensities are too small to characterize. No hydronephrosis. No hydroureter. The urinary bladder is unremarkable. On delayed imaging, there is no urothelial wall thickening and there are no filling defects in the opacified portions of the bilateral collecting systems or ureters. Stomach/Bowel: Partial gastrectomy surgical changes noted. Stomach is within normal limits. No evidence of bowel wall thickening or dilatation. Appendix appears normal. Vascular/Lymphatic: No abdominal aorta or iliac aneurysm. Severe atherosclerotic plaque of the aorta and its branches. No abdominal, pelvic, or inguinal lymphadenopathy. Reproductive: Uterus and bilateral adnexa are unremarkable. Other: No intraperitoneal free fluid. No intraperitoneal free gas. No organized fluid collection. Musculoskeletal: No abdominal wall hernia or abnormality. No suspicious lytic or blastic osseous lesions. No acute displaced fracture. Levoscoliosis of the thoracolumbar spine. Multilevel degenerative changes of the spine. IMPRESSION: 1. Cholelithiasis and choledocholithiasis (up to 1.1cm). 2.  Aortic Atherosclerosis (ICD10-I70.0) - severe. Electronically Signed   By: MIven FinnM.D.   On: 11/28/2021 15:51     Labs:   Basic Metabolic Panel: Recent Labs  Lab 12/07/21 0443 12/08/21 0447 12/09/21 0538 12/10/21 0824 12/11/21 0428  NA 132* 130* 132* 132* 133*  K 4.0 3.4* 3.5 3.9 3.7  CL 92* 91* 95* 96* 98  CO2 '24 28 27 30 26  ' GLUCOSE 164* 152* 78 89 101*  BUN 9  10 6* 5* 6*  CREATININE 0.82 0.77 0.69 0.69 0.59  CALCIUM 8.0* 7.6* 7.7* 8.3* 8.5*  MG 1.2* 1.8 1.4* 1.6*  --    GFR Estimated Creatinine Clearance: 45.6 mL/min (by C-G formula based on SCr of 0.59 mg/dL). Liver Function Tests: Recent Labs  Lab 12/05/21 0804 12/06/21 0448  12/08/21 0447 12/10/21 0824  AST 59* 46* 78* 25  ALT 80* 64* 46* 26  ALKPHOS 215* 208* 155* 128*  BILITOT 2.0* 1.5* 1.2 1.0  PROT 5.2* 5.6* 5.8* 6.0*  ALBUMIN 2.7* 2.8* 2.9* 2.8*   No results for input(s): LIPASE, AMYLASE in the last 168 hours. No results for input(s): AMMONIA in the last 168 hours. Coagulation profile No results for input(s): INR, PROTIME in the last 168 hours.  CBC: Recent Labs  Lab 12/07/21 0443 12/08/21 0447 12/09/21 0538 12/10/21 0824 12/11/21 0428  WBC 23.4* 19.0* 14.8* 12.6* 11.3*  HGB 12.8 11.5* 11.0* 11.8* 11.8*  HCT 38.6 35.5* 33.6* 36.6 35.4*  MCV 93.9 94.9 93.1 93.8 92.7  PLT 299 259 304 377 354   Cardiac Enzymes: No results for input(s): CKTOTAL, CKMB, CKMBINDEX, TROPONINI in the last 168 hours. BNP: Invalid input(s): POCBNP CBG: Recent Labs  Lab 12/10/21 0747 12/10/21 1120 12/10/21 1642 12/10/21 2049 12/11/21 0737  GLUCAP 77 93 253* 131* 98   D-Dimer No results for input(s): DDIMER in the last 72 hours. Hgb A1c No results for input(s): HGBA1C in the last 72 hours. Lipid Profile No results for input(s): CHOL, HDL, LDLCALC, TRIG, CHOLHDL, LDLDIRECT in the last 72 hours. Thyroid function studies No results for input(s): TSH, T4TOTAL, T3FREE, THYROIDAB in the last 72 hours.  Invalid input(s): FREET3 Anemia work up No results for input(s): VITAMINB12, FOLATE, FERRITIN, TIBC, IRON, RETICCTPCT in the last 72 hours. Microbiology Recent Results (from the past 240 hour(s))  Resp Panel by RT-PCR (Flu A&B, Covid) Nasopharyngeal Swab     Status: None   Collection Time: 12/03/21  9:32 AM   Specimen: Nasopharyngeal Swab; Nasopharyngeal(NP) swabs in vial transport medium  Result Value Ref Range Status   SARS Coronavirus 2 by RT PCR NEGATIVE NEGATIVE Final    Comment: (NOTE) SARS-CoV-2 target nucleic acids are NOT DETECTED.  The SARS-CoV-2 RNA is generally detectable in upper respiratory specimens during the acute phase of infection. The  lowest concentration of SARS-CoV-2 viral copies this assay can detect is 138 copies/mL. A negative result does not preclude SARS-Cov-2 infection and should not be used as the sole basis for treatment or other patient management decisions. A negative result may occur with  improper specimen collection/handling, submission of specimen other than nasopharyngeal swab, presence of viral mutation(s) within the areas targeted by this assay, and inadequate number of viral copies(<138 copies/mL). A negative result must be combined with clinical observations, patient history, and epidemiological information. The expected result is Negative.  Fact Sheet for Patients:  EntrepreneurPulse.com.au  Fact Sheet for Healthcare Providers:  IncredibleEmployment.be  This test is no t yet approved or cleared by the [place] FDA and  has been authorized for detection and/or diagnosis of SARS-CoV-2 by FDA under an Emergency Use Authorization (EUA). This EUA will remain  in effect (meaning this test can be used) for the duration of the COVID-19 declaration under Section 564(b)(1) of the Act, 21 U.S.C.section 360bbb-3(b)(1), unless the authorization is terminated  or revoked sooner.       Influenza A by PCR NEGATIVE NEGATIVE Final   Influenza B by PCR NEGATIVE NEGATIVE Final  Comment: (NOTE) The Xpert Xpress SARS-CoV-2/FLU/RSV plus assay is intended as an aid in the diagnosis of influenza from Nasopharyngeal swab specimens and should not be used as a sole basis for treatment. Nasal washings and aspirates are unacceptable for Xpert Xpress SARS-CoV-2/FLU/RSV testing.  Fact Sheet for Patients: EntrepreneurPulse.com.au  Fact Sheet for Healthcare Providers: IncredibleEmployment.be  This test is not yet approved or cleared by the [place] FDA and has been authorized for detection and/or diagnosis of SARS-CoV-2 by FDA under  an Emergency Use Authorization (EUA). This EUA will remain in effect (meaning this test can be used) for the duration of the COVID-19 declaration under Section 564(b)(1) of the Act, 21 U.S.C. section 360bbb-3(b)(1), unless the authorization is terminated or revoked.  Performed at [facility], [place] [address], [place], [place] [address]      Discharge Instructions:   Discharge Instructions     Call MD for:  persistant nausea and vomiting   Complete by: As directed    Call MD for:  redness, tenderness, or signs of infection (pain, swelling, redness, odor or green/yellow discharge around incision site)   Complete by: As directed    Call MD for:  severe uncontrolled pain   Complete by: As directed    Call MD for:  temperature >100.4   Complete by: As directed    Diet Carb Modified   Complete by: As directed    Discharge instructions   Complete by: As directed    Follow-up with care provider at the skilled nursing facility in 3 to 5 days.  Check  CBC liver function test and BMP at that visit.  Follow-up with general surgery as scheduled by the clinic.  Seek medical attention for worsening symptoms   Increase activity slowly   Complete by: As directed       Allergies as of 12/11/2021       Reactions   Asa [aspirin] Other (See Comments)   Causes ulcers   Ibuprofen    Per mar   Tylenol [acetaminophen]    Intolerance due to liver damage   Ciprofloxacin Rash   Sulfa Antibiotics Rash        Medication List     TAKE these medications    benzonatate 100 MG capsule Commonly known as: TESSALON Take 100 mg by mouth every 8 (eight) hours as needed for cough.   Biotin 1000 MCG tablet Take 1,000 mcg by mouth daily.   blood glucose meter kit and supplies Kit Dispense based on patient and insurance preference. Check capillary glucose three times a day, before meals.  Dx E11.65, Z79.4   carvedilol 25 MG tablet Commonly known as: COREG Take 1 tablet (25  mg total) by mouth 2 (two) times daily with a meal.   cetirizine 10 MG tablet Commonly known as: ZYRTEC Take 10 mg by mouth daily as needed for allergies.   colchicine 0.6 MG tablet Take 0.6 mg by mouth daily.   cyclobenzaprine 5 MG tablet Commonly known as: FLEXERIL Take 5 mg by mouth every 12 (twelve) hours as needed for muscle spasms.   docusate sodium 100 MG capsule Commonly known as: COLACE Take 1 capsule (100 mg total) by mouth 2 (two) times daily as needed for mild constipation or moderate constipation.   DULoxetine 60 MG capsule Commonly known as: CYMBALTA TAKE 1 CAPSULE (60 MG TOTAL) DAILY BY MOUTH.   famotidine 20 MG tablet Commonly known as: PEPCID Take 1 tablet (20 mg total) by mouth at bedtime.  feeding supplement Liqd Take 237 mLs by mouth 2 (two) times daily between meals.   fluocinonide cream 0.05 % Commonly known as: LIDEX Apply 1 application topically at bedtime as needed for dry skin.   gabapentin 100 MG capsule Commonly known as: NEURONTIN Take 1 capsule (100 mg) every morning and afternoon, and take 4 capsules (443m) at bedtime What changed:  how much to take how to take this when to take this   glucose 4 GM chewable tablet Chew 1 tablet by mouth as needed for low blood sugar.   glucose blood test strip Commonly known as: ONE TOUCH ULTRA TEST USE TO CHECK GLUCOSE LEVELS 3 TIMES A DAY, DX E11.9, Z79.4   insulin lispro 100 UNIT/ML injection Commonly known as: HUMALOG Inject 0-20 Units into the skin 3 (three) times daily as needed (see admin instructions). 70-150 10 units 151-200 12 units 201-250 14 units 251-300 16 units 301-350 18 units 351-400+ 20 units & notify MD What changed: Another medication with the same name was changed. Make sure you understand how and when to take each.   HumaLOG KwikPen 100 UNIT/ML KwikPen Generic drug: insulin lispro CHECK FSBS BEFORE EACH MEAL AND INJECT PER  SSI(0-80.0UNITS)(80-149,5U)(150-180,9U)(181-500,CALL MD) What changed: See the new instructions.   Insulin Pen Needle 31G X 8 MM Misc Commonly known as: B-D ULTRAFINE III SHORT PEN To use as directed with insulin, four times a day. Dx E11.9, Z79.4   Lantus SoloStar 100 UNIT/ML Solostar Pen Generic drug: insulin glargine Inject 37 Units into the skin daily at 10 pm. What changed:  how much to take when to take this   losartan 100 MG tablet Commonly known as: COZAAR Take 100 mg by mouth daily.   magnesium oxide 400 (240 Mg) MG tablet Commonly known as: MAG-OX Take 1 tablet (400 mg total) by mouth 2 (two) times daily for 5 days.   multivitamin tablet Take 1 tablet by mouth daily.   mupirocin ointment 2 % Commonly known as: BACTROBAN Apply 1 application topically every 12 (twelve) hours as needed (redness/rash).   ondansetron 8 MG tablet Commonly known as: ZOFRAN Take 8 mg by mouth every 8 (eight) hours as needed for nausea or vomiting.   oxyCODONE 5 MG immediate release tablet Commonly known as: Oxy IR/ROXICODONE Take 1 tablet (5 mg total) by mouth every 6 (six) hours as needed for moderate pain or severe pain.   pantoprazole 40 MG tablet Commonly known as: PROTONIX TAKE 1 TABLET (40 MG TOTAL) DAILY BY MOUTH.   polyethylene glycol 17 g packet Commonly known as: MiraLax Take 17 g by mouth daily as needed for moderate constipation (if not helped with stool softner).   rosuvastatin 10 MG tablet Commonly known as: Crestor Take 1 tablet (10 mg total) by mouth daily.        Follow-up Information     [facility] [place]   Specialty: General Surgery Why: 230pm. Please arrive 30 minutes prior to your appointment for paperwork. Please bring a copy of your photo ID and insurance card. Contact information: [address] [phone]                 Time coordinating discharge: 39  minutes  Signed:  Osa Fogarty  [facility] 12/11/2021, 10:55 AM

## 2021-12-11 NOTE — TOC Progression Note (Addendum)
Transition of Care [facility]) - Progression Note    Patient Details  Name: Judith [name] MRN: [id number] Date of Birth: December 30, 1940  [facility] CM/SW Contact  Leeroy Cha, RN Phone Number: 12/11/2021, 10:46 AM  Clinical Narrative:    Patient can go to [facility] this am.  Will have room number and repoort number when dc summary obtained. Patient and daughter informed of transport via ptar. DC summary sent to [facility] patient will go to room1201/ number to call report to is [phone]. Ptar called at [phone]  Expected Discharge Plan: Assisted Living Barriers to Discharge: Continued Medical Work up  Expected Discharge Plan and Services Expected Discharge Plan: Assisted Living   Discharge Planning Services: CM Consult                                           Social Determinants of Health (SDOH) Interventions    Readmission Risk Interventions No flowsheet data found.

## 2021-12-11 NOTE — Progress Notes (Signed)
[facility] Progress Note  5 Days Post-Op  Subjective: CC-  Up in chair. Abdomen still sore at times but improving. Denies n/v. Tolerating diet. Last BM was 2-3 days ago, typically has a BM every day. WBC down 11.3, afebrile  Objective: Vital signs in last 24 hours: Temp:  [98.4 F (36.9 C)-98.9 F (37.2 C)] 98.9 F (37.2 C) (12/19 0552) Pulse Rate:  [65-68] 68 (12/19 0552) Resp:  [18-20] 18 (12/19 0552) BP: (177-182)/(73-75) 177/73 (12/19 0552) SpO2:  [95 %-98 %] 95 % (12/19 0552) Last BM Date: 12/09/21  Intake/Output from previous day: 12/18 0701 - 12/19 0700 In: 2883.4 [P.O.:660; I.V.:2123.4; IV Piggyback:100] Out: 4975 [Urine:4900; Drains:75] Intake/Output this shift: Total I/O In: 240 [P.O.:240] Out: -   PE: Gen:  Alert, NAD, pleasant Pulm: rate and effort normal Abd: Soft, NT/ND, incisions cdi/ epigastric incision with some surrounding ecchymosis, drain serosanguinous  Lab Results:  Recent Labs    12/10/21 0824 12/11/21 0428  WBC 12.6* 11.3*  HGB 11.8* 11.8*  HCT 36.6 35.4*  PLT 377 354   BMET Recent Labs    12/10/21 0824 12/11/21 0428  NA 132* 133*  K 3.9 3.7  CL 96* 98  CO2 30 26  GLUCOSE 89 101*  BUN 5* 6*  CREATININE 0.69 0.59  CALCIUM 8.3* 8.5*   PT/INR No results for input(s): LABPROT, INR in the last 72 hours. CMP     Component Value Date/Time   NA 133 (L) 12/11/2021 0428   NA 140 02/14/2021 1017   K 3.7 12/11/2021 0428   CL 98 12/11/2021 0428   CO2 26 12/11/2021 0428   GLUCOSE 101 (H) 12/11/2021 0428   BUN 6 (L) 12/11/2021 0428   BUN 15 02/14/2021 1017   CREATININE 0.59 12/11/2021 0428   CALCIUM 8.5 (L) 12/11/2021 0428   PROT 6.0 (L) 12/10/2021 0824   PROT 6.7 02/14/2021 1017   ALBUMIN 2.8 (L) 12/10/2021 0824   ALBUMIN 4.2 02/14/2021 1017   AST 25 12/10/2021 0824   ALT 26 12/10/2021 0824   ALKPHOS 128 (H) 12/10/2021 0824   BILITOT 1.0 12/10/2021 0824   BILITOT 0.5 02/14/2021 1017   GFRNONAA >60 12/11/2021  0428   GFRAA 61 02/14/2021 1017   Lipase     Component Value Date/Time   LIPASE 22 12/02/2021 0528       Studies/Results: No results found.  Anti-infectives: Anti-infectives (From admission, onward)    Start     Dose/Rate Route Frequency Ordered Stop   12/06/21 2000  cefTRIAXone (ROCEPHIN) 2 g in sodium chloride 0.9 % 100 mL IVPB       Note to Pharmacy: Pharmacy may adjust dosing strength / duration / interval for maximal efficacy   2 g 200 mL/hr over 30 Minutes Intravenous Every 24 hours 12/06/21 1608     12/06/21 1252  ceFAZolin (ANCEF) 2-4 GM/100ML-% IVPB       Note to Pharmacy: Dellie Catholic: cabinet override      12/06/21 1252 12/07/21 0059   12/05/21 1030  ciprofloxacin (CIPRO) IVPB 400 mg  Status:  Discontinued        400 mg 200 mL/hr over 60 Minutes Intravenous 2 times daily 12/05/21 0935 12/05/21 1505   11/30/21 1415  ciprofloxacin (CIPRO) IVPB 400 mg        400 mg 200 mL/hr over 60 Minutes Intravenous  Once 11/30/21 1402 11/30/21 1501        Assessment/Plan POD #5 s/p Laparoscopic subtotal cholecystectomy with drain placement for gangrenous  cholecystitis - Dr. Brantley Stage, 12/06/2021 - Patient is s/p ERCP pre-op, 11/30/2021, Biliary Fistulotomy and CBD stent placement by Dr. Rush Landmark. GI has already arranged follow up for repeat ERCP on 02/01/22 per notes - today is day #5 of abx, ok to stop after this from surgical standpoint - drain is serosanguinous - d/c prior to discharge if output remains non-bilious - continue therapies, recommending SNF - Surgically stable for discharge to SNF when bed available. Discharge instructions and follow up info on AVS   FEN: CM. Add colace/ miralax ID: Rocephin 12/14 >>  VTE: SCDs, Lovenox   Complete heart block status post pacemaker placement Hypertension Hyperlipidemia Diabetes mellitus -A1c 7.9   LOS: 12 days    Wellington [place], [facility] Surgery 12/11/2021, 9:12 AM Please see Amion for pager number  during day hours 7:00am-4:30pm

## 2021-12-11 NOTE — Progress Notes (Signed)
Made md aware of patient's BP trends.

## 2021-12-11 NOTE — Progress Notes (Signed)
Report given to [facility] at [facility]. IV removed. Pt dressed in personal clothes. Daughter took belongings earlier. AVS printed and placed in packed along with hard script. PT transport via [facility]

## 2021-12-12 DIAGNOSIS — E871 Hypo-osmolality and hyponatremia: Secondary | ICD-10-CM | POA: Diagnosis not present

## 2021-12-12 DIAGNOSIS — K8050 Calculus of bile duct without cholangitis or cholecystitis without obstruction: Secondary | ICD-10-CM | POA: Diagnosis not present

## 2021-12-12 DIAGNOSIS — K810 Acute cholecystitis: Secondary | ICD-10-CM | POA: Diagnosis not present

## 2021-12-12 DIAGNOSIS — Z794 Long term (current) use of insulin: Secondary | ICD-10-CM | POA: Diagnosis not present

## 2021-12-12 DIAGNOSIS — M109 Gout, unspecified: Secondary | ICD-10-CM | POA: Diagnosis not present

## 2021-12-12 DIAGNOSIS — R630 Anorexia: Secondary | ICD-10-CM | POA: Diagnosis not present

## 2021-12-12 DIAGNOSIS — I10 Essential (primary) hypertension: Secondary | ICD-10-CM | POA: Diagnosis not present

## 2021-12-12 DIAGNOSIS — E0841 Diabetes mellitus due to underlying condition with diabetic mononeuropathy: Secondary | ICD-10-CM | POA: Diagnosis not present

## 2021-12-12 DIAGNOSIS — Z950 Presence of cardiac pacemaker: Secondary | ICD-10-CM | POA: Diagnosis not present

## 2021-12-12 DIAGNOSIS — G8929 Other chronic pain: Secondary | ICD-10-CM | POA: Diagnosis not present

## 2021-12-12 DIAGNOSIS — K5901 Slow transit constipation: Secondary | ICD-10-CM | POA: Diagnosis not present

## 2021-12-12 DIAGNOSIS — E118 Type 2 diabetes mellitus with unspecified complications: Secondary | ICD-10-CM | POA: Diagnosis not present

## 2021-12-27 ENCOUNTER — Other Ambulatory Visit: Payer: Self-pay | Admitting: *Deleted

## 2021-12-27 NOTE — Patient Outreach (Signed)
Per [place] eligible member resides in [facility]. Screened for potential [facility] Care Management services.   Update received from [place] indicating Mrs. Marik will return to [place] on 12/29/21.  Writer will sign off. No identifiable [facility] Care Management needs at this time.     Marthenia Rolling, MSN, RN,BSN [place] Acute Care Coordinator [phone] [facility]) [phone]  (Toll free office)

## 2022-01-09 ENCOUNTER — Encounter (INDEPENDENT_AMBULATORY_CARE_PROVIDER_SITE_OTHER): Payer: Medicare Other | Admitting: Ophthalmology

## 2022-01-09 ENCOUNTER — Other Ambulatory Visit: Payer: Self-pay

## 2022-01-09 DIAGNOSIS — I10 Essential (primary) hypertension: Secondary | ICD-10-CM

## 2022-01-09 DIAGNOSIS — H43813 Vitreous degeneration, bilateral: Secondary | ICD-10-CM

## 2022-01-09 DIAGNOSIS — H35033 Hypertensive retinopathy, bilateral: Secondary | ICD-10-CM

## 2022-01-09 DIAGNOSIS — E113392 Type 2 diabetes mellitus with moderate nonproliferative diabetic retinopathy without macular edema, left eye: Secondary | ICD-10-CM | POA: Diagnosis not present

## 2022-01-09 DIAGNOSIS — E113311 Type 2 diabetes mellitus with moderate nonproliferative diabetic retinopathy with macular edema, right eye: Secondary | ICD-10-CM | POA: Diagnosis not present

## 2022-01-19 ENCOUNTER — Telehealth: Payer: Self-pay

## 2022-01-19 ENCOUNTER — Other Ambulatory Visit: Payer: Self-pay

## 2022-01-19 DIAGNOSIS — R7989 Other specified abnormal findings of blood chemistry: Secondary | ICD-10-CM

## 2022-01-19 NOTE — Telephone Encounter (Signed)
LFT's have been ordered. Spoke with pt's daughter & she plans to bring pt in within the next week for lab work.

## 2022-01-19 NOTE — Telephone Encounter (Signed)
-----   Message from Irving Copas., MD sent at 01/19/2022  1:16 PM EST ----- Regarding: Follow-up Patty, this patient needs at least a set of LFTs to see how things are 2 months after her hospitalization and ERCP and cholecystectomy.  Lets work on trying to get that done in the next week or so.  Thanks. GM

## 2022-01-21 NOTE — Progress Notes (Signed)
Cardiology office note:    Date:  01/22/2022   ID:  Judith [name], DOB [birth year], MRN [id number]  PCP:  Janie Morning, DO  Cardiologist:  Sanda Klein, MD    Referring MD: No ref. provider found   Chief complaint: pacemaker check  History of Present Illness:    Judith [name] is a 81 y.o. female with a hx of pacemaker implantation in [place].  She has complete heart block and is pacemaker dependent.  She moved to [place] while recovering from a fall but now is a resident at [facility] with no plans of returning to [place].    She was hospitalized 11/28/2021 with coledocholithiasis, had ERCP/biliary stent 11/30/2021 and lap cholecystectomy 12/06/2021, scheduled for another ERCP 02/01/2022 for stent removal.  The patient specifically denies any chest pain at rest exertion, dyspnea at rest or with exertion, orthopnea, paroxysmal nocturnal dyspnea, syncope, palpitations, focal neurological deficits, intermittent claudication, lower extremity edema, unexplained weight gain, cough, hemoptysis or wheezing.  A year ago, LDL was 66 (on statin). Most recent Hgb A1c is 7.9%.  Last device check was in-office 07/10/2021.  She has a [employer] [id number] device that is not capable of remote downloads.  She has complete heart block and 100% ventricular pacing.  There is no underlying escape rhythm.  She only paces the atrium 8% of the time.  She has not any atrial fibrillation or high ventricular rates.  The heart rate histogram distribution is good and lead parameters are excellent.    Her initial pacemaker was implanted in 2007.  It appears that her ventricular lead is still the original one (Guidant [id number] implanted July 05, 2006).  June 09, 2010 she underwent a pacemaker generator change out and placement of a new atrial lead (Guidant [id number]).   At one previous device check we had detected a 6-second episode of possible atrial fibrillation.  She does not have a history of coronary  disease.  She reports a normal stress test in the past.  She had an echocardiogram just a few months before coming to [place] and was told that her heart pumping strength is normal.  She was told that she has a murmur which is due to a mildly leaky valve.  There is no reported history of heart failure that I am aware of.   She does not have intermittent claudication and reports that lower extremity ABIs have been performed in the past with normal results.  She has never had a stroke or TIA.  She was taking lovastatin for lipid lowering but has been off this medication for a while.  She has lost a lot of weight since then.  Past Medical History:  Diagnosis Date   Anemia    Arthritis    Biliary gastritis    Blood transfusion without reported diagnosis    2007   Cervical spine arthritis 04/05/2016   Controlled type 2 diabetes mellitus with diabetic neuropathy, with long-term current use of insulin ([facility])    Depression    Diabetes mellitus type 2 with complications ([facility])    Dilated bile duct    Duodenal ulcer disease    Encounter for long-term (current) use of insulin ([facility])    Gallstones 01/17/2018   Gastric ulcer    Gastritis and gastroduodenitis    Gastroparesis    severe   GERD (gastroesophageal reflux disease)    Gout    tested for gout but was not   Heart murmur    Hyperlipidemia  in past not now   Hypertension    Hypokalemia, gastrointestinal losses    secondary to severe gastroparesis   Long-term insulin use in type 2 diabetes ([facility]) 04/05/2016   Osteoporosis    just had bone density test possible    Peripheral neuropathy    Presence of cardiac pacemaker    S/P partial gastrectomy    due to severe gastric and gudoenal ulcers   Seasonal allergies    Sleep apnea    could not use CPAP   UTI (urinary tract infection)     Past Surgical History:  Procedure Laterality Date   BILIARY STENT PLACEMENT N/A 11/30/2021   Procedure: BILIARY STENT PLACEMENT;  Surgeon: Irving Copas., MD;  Location: [facility];  Service: Gastroenterology;  Laterality: N/A;   BIOPSY  11/30/2021   Procedure: BIOPSY;  Surgeon: Rush Landmark Telford Nab., MD;  Location: [facility];  Service: Gastroenterology;;   CHOLECYSTECTOMY N/A 12/06/2021   Procedure: LAPAROSCOPIC CHOLECYSTECTOMY, LYSIS OF ADHESIONS;  Surgeon: Erroll Luna, MD;  Location: [facility];  Service: General;  Laterality: N/A;   COLONOSCOPY  02/11/2018   no polyps, + small internal hemorrhoids   ENDOSCOPIC RETROGRADE CHOLANGIOPANCREATOGRAPHY (ERCP) WITH PROPOFOL N/A 11/30/2021   Procedure: ENDOSCOPIC RETROGRADE CHOLANGIOPANCREATOGRAPHY (ERCP) WITH PROPOFOL;  Surgeon: Irving Copas., MD;  Location: [facility];  Service: Gastroenterology;  Laterality: N/A;   ESOPHAGOGASTRODUODENOSCOPY (EGD) WITH PROPOFOL N/A 11/30/2021   Procedure: ESOPHAGOGASTRODUODENOSCOPY (EGD) WITH PROPOFOL;  Surgeon: Rush Landmark Telford Nab., MD;  Location: [facility];  Service: Gastroenterology;  Laterality: N/A;   EUS N/A 12/12/2017   Procedure: UPPER ENDOSCOPIC ULTRASOUND (EUS) RADIAL;  Surgeon: [name], MD;  Location: [facility];  Service: Endoscopy;  Laterality: N/A;   GASTRECTOMY     JOINT REPLACEMENT     pace maker     PARTIAL GASTRECTOMY     SPHINCTEROTOMY  11/30/2021   Procedure: SPHINCTEROTOMY;  Surgeon: Mansouraty, Telford Nab., MD;  Location: [facility];  Service: Gastroenterology;;   [place] INJECTION  11/30/2021   Procedure: SUBMUCOSAL TATTOO INJECTION;  Surgeon: Irving Copas., MD;  Location: [facility];  Service: Gastroenterology;;   TONSILLECTOMY     45 years ago   UPPER GASTROINTESTINAL ENDOSCOPY      Current Medications: Current Meds  Medication Sig   benzonatate (TESSALON) 100 MG capsule Take 100 mg by mouth every 8 (eight) hours as needed for cough.   Biotin 1000 MCG tablet Take 1,000 mcg by mouth daily.    blood glucose meter kit and supplies KIT Dispense based on patient and  insurance preference. Check capillary glucose three times a day, before meals.  Dx E11.65, Z79.4   carvedilol (COREG) 25 MG tablet Take 1 tablet (25 mg total) by mouth 2 (two) times daily with a meal.   cetirizine (ZYRTEC) 10 MG tablet Take 10 mg by mouth daily as needed for allergies.   colchicine 0.6 MG tablet Take 0.6 mg by mouth daily.    cyclobenzaprine (FLEXERIL) 5 MG tablet Take 5 mg by mouth every 12 (twelve) hours as needed for muscle spasms.   docusate sodium (COLACE) 100 MG capsule Take 1 capsule (100 mg total) by mouth 2 (two) times daily as needed for mild constipation or moderate constipation.   DULoxetine (CYMBALTA) 60 MG capsule TAKE 1 CAPSULE (60 MG TOTAL) DAILY BY MOUTH.   famotidine (PEPCID) 20 MG tablet Take 1 tablet (20 mg total) by mouth at bedtime.   fluocinonide cream (LIDEX) 9.38 % Apply 1 application topically at bedtime  as needed for dry skin.   gabapentin (NEURONTIN) 100 MG capsule Take 1 capsule (100 mg) every morning and afternoon, and take 4 capsules (462m) at bedtime (Patient taking differently: Take 100-400 mg by mouth See admin instructions. Take 1 capsule (100 mg) every morning and afternoon, and take 4 capsules (4043m at bedtime)   glucose 4 GM chewable tablet Chew 1 tablet by mouth as needed for low blood sugar.   glucose blood (ONE TOUCH ULTRA TEST) test strip USE TO CHECK GLUCOSE LEVELS 3 TIMES A DAY, DX E11.9, Z79.4   HUMALOG KWIKPEN 100 UNIT/ML KwikPen CHECK FSBS BEFORE EACH MEAL AND INJECT PER SSI(0-80.0UNITS)(80-149,5U)(150-180,9U)(181-500,CALL MD) (Patient taking differently: 0-4 Units at bedtime as needed (See admin instructions). 0-249 0 units 250-400 4 units Over 400 notify MD)   insulin glargine (LANTUS SOLOSTAR) 100 UNIT/ML Solostar Pen Inject 37 Units into the skin daily at 10 pm. (Patient taking differently: Inject 22 Units into the skin every morning.)   insulin lispro (HUMALOG) 100 UNIT/ML injection Inject 0-20 Units into the skin 3 (three)  times daily as needed (see admin instructions). 70-150 10 units 151-200 12 units 201-250 14 units 251-300 16 units 301-350 18 units 351-400+ 20 units & notify MD   Insulin Pen Needle (B-D ULTRAFINE III SHORT PEN) 31G X 8 MM MISC To use as directed with insulin, four times a day. Dx E11.9, Z79.4   losartan (COZAAR) 100 MG tablet Take 100 mg by mouth daily.    Multiple Vitamin (MULTIVITAMIN) tablet Take 1 tablet by mouth daily.    mupirocin ointment (BACTROBAN) 2 % Apply 1 application topically every 12 (twelve) hours as needed (redness/rash).   pantoprazole (PROTONIX) 40 MG tablet TAKE 1 TABLET (40 MG TOTAL) DAILY BY MOUTH.   polyethylene glycol (MIRALAX) 17 g packet Take 17 g by mouth daily as needed for moderate constipation (if not helped with stool softner).   rosuvastatin (CRESTOR) 10 MG tablet Take 1 tablet (10 mg total) by mouth daily.   [DISCONTINUED] feeding supplement (ENSURE ENLIVE / ENSURE PLUS) LIQD Take 237 mLs by mouth 2 (two) times daily between meals.   [DISCONTINUED] ondansetron (ZOFRAN) 8 MG tablet Take 8 mg by mouth every 8 (eight) hours as needed for nausea or vomiting.   [DISCONTINUED] oxyCODONE (OXY IR/ROXICODONE) 5 MG immediate release tablet Take 1 tablet (5 mg total) by mouth every 6 (six) hours as needed for moderate pain or severe pain.     Allergies:   Asa [aspirin], Ibuprofen, Tylenol [acetaminophen], Ciprofloxacin, and Sulfa antibiotics   Social History   Socioeconomic History   Marital status: Widowed    Spouse name: Not on file   Number of children: 3   Years of education: Not on file   Highest education level: High school graduate  Occupational History   Not on file  Tobacco Use   Smoking status: Never   Smokeless tobacco: Never  Vaping Use   Vaping Use: Never used  Substance and Sexual Activity   Alcohol use: No    Alcohol/week: 0.0 standard drinks   Drug use: No   Sexual activity: Not Currently  Other Topics Concern   Not on file  Social  History Narrative   [place] had significant issues with severe gastroparesis resulting in severe hypokalemia and general weakness and fall resulting in hospitalization and SNF rehab   Now (10/2017) [place] able to [place] and regain independence.  May 2019   Patient living in assisted living, [place]   Social Determinants of Health   Financial Resource Strain: Not on file  Food Insecurity: Not on file  Transportation Needs: Not on file  Physical Activity: Not on file  Stress: Not on file  Social Connections: Not on file     Family History: The patient's family history significant for the absence of premature cardiac illness  ROS:   Please see the history of present illness.    All other systems are reviewed and are negative.   EKGs/Labs/Other Studies Reviewed:    The following studies were reviewed today: Comprehensive pacemaker interrogation  EKG:  EKG is not ordered today.  Atrial sensed, ventricular paced rhythm with very broad QRS at 174 ms.  Appropriately prolonged QTC 505 ms. Recent Labs: 12/10/2021: ALT 26; Magnesium 1.6 12/11/2021: BUN 6; Creatinine, Ser 0.59; Hemoglobin 11.8; Platelets 354; Potassium 3.7; Sodium 133  11/29/2021 A1c 7.9% Recent Lipid Panel    Component Value Date/Time   CHOL 140 02/14/2021 1017   TRIG 95 02/14/2021 1017   HDL 56 02/14/2021 1017   CHOLHDL 2.5 02/14/2021 1017   LDLCALC 66 02/14/2021 1017    Physical Exam:    VS:  BP (!) 152/76    Pulse 73    Ht _0  (1.626 m)    Wt 126 lb (57.2 kg)    SpO2 96%    BMI 21.63 kg/m     Wt Readings from Last 3 Encounters:  01/22/22 126 lb (57.2 kg)  12/06/21 130 lb 1.1 oz (59 kg)  07/10/21 128 lb 6.4 oz (58.2 kg)      General: Alert, oriented x3, no distress, healthy left subclavian PM site Head: no evidence of trauma, PERRL, EOMI, no exophtalmos or lid lag, no myxedema, no xanthelasma; normal ears, nose and  oropharynx Neck: normal jugular venous pulsations and no hepatojugular reflux; brisk carotid pulses without delay and no carotid bruits Chest: clear to auscultation, no signs of consolidation by percussion or palpation, normal fremitus, symmetrical and full respiratory excursions Cardiovascular: normal position and quality of the apical impulse, regular rhythm, normal first and second heart sounds, no murmurs, rubs or gallops Abdomen: no tenderness or distention, no masses by palpation, no abnormal pulsatility or arterial bruits, normal bowel sounds, no hepatosplenomegaly Extremities: no clubbing, cyanosis or edema; 2+ radial, ulnar and brachial pulses bilaterally; 2+ right femoral, posterior tibial and dorsalis pedis pulses; 2+ left femoral, posterior tibial and dorsalis pedis pulses; no subclavian or femoral bruits Neurological: grossly nonfocal Psych: Normal mood and affect    ASSESSMENT:    1. CHB (complete heart block) (HCC)   2. Pacemaker   3. Controlled type 2 diabetes mellitus with diabetic neuropathy, with long-term current use of insulin ([facility])   4. Benign essential HTN   5. Choledocholithiasis     PLAN:    In order of problems listed above:  CHB: Pacemaker dependent.  Asymptomatic. PPM: Roughly 18 months battery longevity, device cannot do remote downloads, bring back in 6 months.  When she reaches the last year of anticipated battery longevity will increase the frequency of office checks to every 3 months. DM: With multiple complications including gastroparesis and neuropathy.  Glycemic control has deteriorated likely due to her pancreatitis/cholecystitis events.  LDL in target range. HTN: a little high today, but usually controlled; continue current medications; avoid higher doses of diuretics due to history of pseudogout and risk of dehydration in the setting of gastroparesis.  Choledocholithiasis: recovering well.  Stent removal next week.   Medication Adjustments/Labs and  Tests Ordered: Current medicines are reviewed at length with the patient today.  Concerns regarding medicines are outlined above.  No orders of the defined types were placed in this encounter.  No orders of the defined types were placed in this encounter.   Patient Instructions  Medication Instructions:  No changes *If you need a refill on your cardiac medications before your next appointment, please call your pharmacy*   Lab Work: None ordered If you have labs (blood work) drawn today and your tests are completely normal, you will receive your results only by: [facility] (if you have MyChart) OR A paper copy in the mail If you have any lab test that is abnormal or we need to change your treatment, we will call you to review the results.   Testing/Procedures: None ordered   Follow-Up: At [facility], you and your health needs are our priority.  As part of our continuing mission to provide you with exceptional heart care, we have created designated Provider Care Teams.  These Care Teams include your primary Cardiologist (physician) and Advanced Practice Providers (APPs -  Physician Assistants and Nurse Practitioners) who all work together to provide you with the care you need, when you need it.  We recommend signing up for the patient portal called "MyChart".  Sign up information is provided on this After Visit Summary.  MyChart is used to connect with patients for Virtual Visits (Telemedicine).  Patients are able to view lab/test results, encounter notes, upcoming appointments, etc.  Non-urgent messages can be sent to your provider as well.   To learn more about what you can do with MyChart, go to [email or web address].    Your next appointment:   6 month(s)  The format for your next appointment:   In Person  Provider:   Sanda Klein, MD {     Signed, Sanda Klein, MD  01/22/2022 9:33 AM    [place]

## 2022-01-22 ENCOUNTER — Other Ambulatory Visit (INDEPENDENT_AMBULATORY_CARE_PROVIDER_SITE_OTHER): Payer: Medicare Other

## 2022-01-22 ENCOUNTER — Ambulatory Visit (INDEPENDENT_AMBULATORY_CARE_PROVIDER_SITE_OTHER): Payer: Medicare Other | Admitting: Cardiovascular Disease

## 2022-01-22 ENCOUNTER — Other Ambulatory Visit: Payer: Self-pay

## 2022-01-22 ENCOUNTER — Encounter: Payer: Self-pay | Admitting: Cardiovascular Disease

## 2022-01-22 VITALS — BP 152/76 | HR 73 | Ht 64.0 in | Wt 126.0 lb

## 2022-01-22 DIAGNOSIS — Z794 Long term (current) use of insulin: Secondary | ICD-10-CM

## 2022-01-22 DIAGNOSIS — I10 Essential (primary) hypertension: Secondary | ICD-10-CM | POA: Diagnosis not present

## 2022-01-22 DIAGNOSIS — I442 Atrioventricular block, complete: Secondary | ICD-10-CM

## 2022-01-22 DIAGNOSIS — R7989 Other specified abnormal findings of blood chemistry: Secondary | ICD-10-CM | POA: Diagnosis not present

## 2022-01-22 DIAGNOSIS — Z950 Presence of cardiac pacemaker: Secondary | ICD-10-CM | POA: Diagnosis not present

## 2022-01-22 DIAGNOSIS — E1140 Type 2 diabetes mellitus with diabetic neuropathy, unspecified: Secondary | ICD-10-CM

## 2022-01-22 DIAGNOSIS — K8050 Calculus of bile duct without cholangitis or cholecystitis without obstruction: Secondary | ICD-10-CM

## 2022-01-22 LAB — HEPATIC FUNCTION PANEL
ALT: 5 U/L (ref 0–35)
AST: 13 U/L (ref 0–37)
Albumin: 3.9 g/dL (ref 3.5–5.2)
Alkaline Phosphatase: 55 U/L (ref 39–117)
Bilirubin, Direct: 0.1 mg/dL (ref 0.0–0.3)
Total Bilirubin: 0.6 mg/dL (ref 0.2–1.2)
Total Protein: 6.8 g/dL (ref 6.0–8.3)

## 2022-01-22 NOTE — Patient Instructions (Signed)

## 2022-01-24 ENCOUNTER — Encounter (HOSPITAL_COMMUNITY): Payer: Self-pay | Admitting: Gastroenterology

## 2022-02-01 ENCOUNTER — Ambulatory Visit (HOSPITAL_COMMUNITY): Payer: Medicare Other

## 2022-02-01 ENCOUNTER — Encounter (HOSPITAL_COMMUNITY)
Admission: RE | Disposition: A | Discharge status: Home / Self Care | Payer: Self-pay | Source: Ambulatory Visit | Attending: Gastroenterology

## 2022-02-01 ENCOUNTER — Ambulatory Visit (HOSPITAL_COMMUNITY): Payer: Medicare Other | Admitting: Anesthesiology

## 2022-02-01 ENCOUNTER — Ambulatory Visit (HOSPITAL_BASED_OUTPATIENT_CLINIC_OR_DEPARTMENT_OTHER): Payer: Medicare Other | Admitting: Anesthesiology

## 2022-02-01 ENCOUNTER — Ambulatory Visit (HOSPITAL_COMMUNITY)
Admission: RE | Admit: 2022-02-01 | Discharge: 2022-02-01 | Disposition: A | Discharge status: Home / Self Care | Payer: Medicare Other | Source: Ambulatory Visit | Attending: Gastroenterology | Admitting: Gastroenterology

## 2022-02-01 ENCOUNTER — Encounter (HOSPITAL_COMMUNITY): Payer: Self-pay | Admitting: Gastroenterology

## 2022-02-01 DIAGNOSIS — R7989 Other specified abnormal findings of blood chemistry: Secondary | ICD-10-CM

## 2022-02-01 DIAGNOSIS — K8050 Calculus of bile duct without cholangitis or cholecystitis without obstruction: Secondary | ICD-10-CM

## 2022-02-01 DIAGNOSIS — K838 Other specified diseases of biliary tract: Secondary | ICD-10-CM

## 2022-02-01 DIAGNOSIS — K6389 Other specified diseases of intestine: Secondary | ICD-10-CM | POA: Diagnosis not present

## 2022-02-01 DIAGNOSIS — Z794 Long term (current) use of insulin: Secondary | ICD-10-CM | POA: Insufficient documentation

## 2022-02-01 DIAGNOSIS — K219 Gastro-esophageal reflux disease without esophagitis: Secondary | ICD-10-CM | POA: Insufficient documentation

## 2022-02-01 DIAGNOSIS — G4730 Sleep apnea, unspecified: Secondary | ICD-10-CM | POA: Diagnosis not present

## 2022-02-01 DIAGNOSIS — D759 Disease of blood and blood-forming organs, unspecified: Secondary | ICD-10-CM | POA: Insufficient documentation

## 2022-02-01 DIAGNOSIS — Z980 Intestinal bypass and anastomosis status: Secondary | ICD-10-CM | POA: Diagnosis not present

## 2022-02-01 DIAGNOSIS — K2960 Other gastritis without bleeding: Secondary | ICD-10-CM

## 2022-02-01 DIAGNOSIS — Z950 Presence of cardiac pacemaker: Secondary | ICD-10-CM | POA: Diagnosis not present

## 2022-02-01 DIAGNOSIS — Z419 Encounter for procedure for purposes other than remedying health state, unspecified: Secondary | ICD-10-CM

## 2022-02-01 DIAGNOSIS — D649 Anemia, unspecified: Secondary | ICD-10-CM | POA: Diagnosis not present

## 2022-02-01 DIAGNOSIS — E1143 Type 2 diabetes mellitus with diabetic autonomic (poly)neuropathy: Secondary | ICD-10-CM | POA: Insufficient documentation

## 2022-02-01 DIAGNOSIS — E119 Type 2 diabetes mellitus without complications: Secondary | ICD-10-CM | POA: Diagnosis not present

## 2022-02-01 DIAGNOSIS — I10 Essential (primary) hypertension: Secondary | ICD-10-CM | POA: Diagnosis not present

## 2022-02-01 DIAGNOSIS — Z9049 Acquired absence of other specified parts of digestive tract: Secondary | ICD-10-CM | POA: Diagnosis not present

## 2022-02-01 DIAGNOSIS — Z8711 Personal history of peptic ulcer disease: Secondary | ICD-10-CM | POA: Insufficient documentation

## 2022-02-01 DIAGNOSIS — Z4659 Encounter for fitting and adjustment of other gastrointestinal appliance and device: Secondary | ICD-10-CM | POA: Diagnosis not present

## 2022-02-01 HISTORY — PX: STENT REMOVAL: SHX6421

## 2022-02-01 HISTORY — PX: REMOVAL OF STONES: SHX5545

## 2022-02-01 HISTORY — PX: ENDOSCOPIC RETROGRADE CHOLANGIOPANCREATOGRAPHY (ERCP) WITH PROPOFOL: SHX5810

## 2022-02-01 HISTORY — PX: BILIARY DILATION: SHX6850

## 2022-02-01 HISTORY — PX: BILIARY STENT PLACEMENT: SHX5538

## 2022-02-01 LAB — GLUCOSE, CAPILLARY
Glucose-Capillary: 348 mg/dL — ABNORMAL HIGH (ref 70–99)
Glucose-Capillary: 353 mg/dL — ABNORMAL HIGH (ref 70–99)
Glucose-Capillary: 345 mg/dL — ABNORMAL HIGH (ref 70–99)

## 2022-02-01 SURGERY — ENDOSCOPIC RETROGRADE CHOLANGIOPANCREATOGRAPHY (ERCP) WITH PROPOFOL
Anesthesia: General

## 2022-02-01 MED ORDER — SODIUM CHLORIDE 0.9 % IV SOLN
INTRAVENOUS | Status: AC
  Filled 2022-02-01: qty 8

## 2022-02-01 MED ORDER — SUCCINYLCHOLINE CHLORIDE 200 MG/10ML IV SOSY
PREFILLED_SYRINGE | INTRAVENOUS | Status: DC | PRN
  Administered 2022-02-01: 60 mg via INTRAVENOUS

## 2022-02-01 MED ORDER — SODIUM CHLORIDE 0.9 % IV SOLN: INTRAVENOUS | Status: DC

## 2022-02-01 MED ORDER — INSULIN ASPART 100 UNIT/ML IJ SOLN
INTRAMUSCULAR | Status: AC
  Filled 2022-02-01: qty 1

## 2022-02-01 MED ORDER — PROPOFOL 10 MG/ML IV BOLUS
INTRAVENOUS | Status: DC | PRN
  Administered 2022-02-01: 80 mg via INTRAVENOUS

## 2022-02-01 MED ORDER — GLUCAGON HCL RDNA (DIAGNOSTIC) 1 MG IJ SOLR
INTRAMUSCULAR | Status: DC | PRN
  Administered 2022-02-01 (×4): .25 mg via INTRAVENOUS

## 2022-02-01 MED ORDER — LACTATED RINGERS IV SOLN: INTRAVENOUS | Status: DC | PRN

## 2022-02-01 MED ORDER — INSULIN ASPART 100 UNIT/ML IJ SOLN
10.0000 [IU] | Freq: Once | INTRAMUSCULAR | Status: AC
  Administered 2022-02-01: 10 [IU] via SUBCUTANEOUS
  Filled 2022-02-01: qty 0.1

## 2022-02-01 MED ORDER — SODIUM CHLORIDE 0.9 % IV SOLN
INTRAVENOUS | Status: DC | PRN
  Administered 2022-02-01: 75 mL

## 2022-02-01 MED ORDER — DEXAMETHASONE SODIUM PHOSPHATE 10 MG/ML IJ SOLN
INTRAMUSCULAR | Status: DC | PRN
  Administered 2022-02-01: 5 mg via INTRAVENOUS

## 2022-02-01 MED ORDER — INDOMETHACIN 50 MG RE SUPP
RECTAL | Status: AC
  Filled 2022-02-01: qty 2

## 2022-02-01 MED ORDER — FENTANYL CITRATE (PF) 100 MCG/2ML IJ SOLN
INTRAMUSCULAR | Status: DC | PRN
  Administered 2022-02-01: 25 ug via INTRAVENOUS

## 2022-02-01 MED ORDER — SODIUM CHLORIDE 0.9 % IV SOLN
INTRAVENOUS | Status: DC | PRN
  Administered 2022-02-01: 3 g via INTRAVENOUS

## 2022-02-01 MED ORDER — CIPROFLOXACIN IN D5W 400 MG/200ML IV SOLN
INTRAVENOUS | Status: AC
  Filled 2022-02-01: qty 200

## 2022-02-01 MED ORDER — FENTANYL CITRATE (PF) 100 MCG/2ML IJ SOLN: 25.0000 ug | INTRAMUSCULAR | Status: DC | PRN

## 2022-02-01 MED ORDER — ONDANSETRON HCL 4 MG/2ML IJ SOLN
INTRAMUSCULAR | Status: DC | PRN
  Administered 2022-02-01: 4 mg via INTRAVENOUS

## 2022-02-01 MED ORDER — PHENYLEPHRINE HCL-NACL 20-0.9 MG/250ML-% IV SOLN
INTRAVENOUS | Status: DC | PRN
  Administered 2022-02-01: 30 ug/min via INTRAVENOUS

## 2022-02-01 MED ORDER — SUGAMMADEX SODIUM 200 MG/2ML IV SOLN
INTRAVENOUS | Status: DC | PRN
  Administered 2022-02-01: 125 mg via INTRAVENOUS

## 2022-02-01 MED ORDER — PROMETHAZINE HCL 25 MG/ML IJ SOLN: 6.2500 mg | INTRAMUSCULAR | Status: DC | PRN

## 2022-02-01 MED ORDER — LIDOCAINE 2% (20 MG/ML) 5 ML SYRINGE
INTRAMUSCULAR | Status: DC | PRN
  Administered 2022-02-01: 50 mg via INTRAVENOUS

## 2022-02-01 MED ORDER — INDOMETHACIN 50 MG RE SUPP
RECTAL | Status: DC | PRN
  Administered 2022-02-01: 100 mg via RECTAL

## 2022-02-01 MED ORDER — PHENYLEPHRINE 40 MCG/ML (10ML) SYRINGE FOR IV PUSH (FOR BLOOD PRESSURE SUPPORT)
PREFILLED_SYRINGE | INTRAVENOUS | Status: DC | PRN
  Administered 2022-02-01: 40 ug via INTRAVENOUS
  Administered 2022-02-01: 80 ug via INTRAVENOUS
  Administered 2022-02-01 (×2): 40 ug via INTRAVENOUS
  Administered 2022-02-01: 80 ug via INTRAVENOUS
  Administered 2022-02-01: 40 ug via INTRAVENOUS

## 2022-02-01 MED ORDER — ROCURONIUM BROMIDE 100 MG/10ML IV SOLN
INTRAVENOUS | Status: DC | PRN
  Administered 2022-02-01: 40 mg via INTRAVENOUS
  Administered 2022-02-01: 20 mg via INTRAVENOUS

## 2022-02-01 NOTE — Op Note (Addendum)
[facility] Patient Name: Judith [name] Procedure Date : 02/01/2022 MRN: [id number] Attending MD: Justice Britain , MD Date of Birth: 04-Dec-1941 CSN: [id number] Age: 81 Admit Type: Outpatient Procedure:                ERCP Indications:              Bile duct stone(s), Biliary stent removal, Postop                            exam: Cholecystectomy, Prior Endoscopic Retrograde                            Cholangiopancreatography Providers:                Justice Britain, MD, Jeanella Cara, RN,                            Cletis Athens, Technician, Nicholes Calamity Referring MD:             Lajuan Lines. Pyrtle, MD, Jayme Cloud. Collins, Dr. Brantley Stage Medicines:                General Anesthesia, Unasyn 3 g IV, Indomethacin 100                            mg PR, Glucagon 1 mg IV Complications:            No immediate complications. Estimated Blood Loss:     Estimated blood loss was minimal. Procedure:                Pre-Anesthesia Assessment:                           - Prior to the procedure, a History and Physical                            was performed, and patient medications and                            allergies were reviewed. The patient's tolerance of                            previous anesthesia was also reviewed. The risks                            and benefits of the procedure and the sedation                            options and risks were discussed with the patient.                            All questions were answered, and informed consent                            was obtained. Prior Anticoagulants: The patient has  taken no previous anticoagulant or antiplatelet                            agents except for aspirin. ASA Grade Assessment:                            III - A patient with severe systemic disease. After                            reviewing the risks and benefits, the patient was                            deemed in  satisfactory condition to undergo the                            procedure.                           After obtaining informed consent, the scope was                            passed under direct vision. Throughout the                            procedure, the patient's blood pressure, pulse, and                            oxygen saturations were monitored continuously. The                            GIF-H190 ([id number]) Olympus endoscope was introduced                            through the mouth, and used to inject contrast into                            and used to locate the major papilla. The PCF-190TL                            ([id number]) Olympus colonoscope was introduced                            through the mouth, and used to inject contrast into                            and used to locate the major papilla. The [id number]-Q190V                            ([id number]) Olympus duodenoscope was introduced                            through the mouth, and used to inject contrast into  and used to inject contrast into the bile duct. The                            ERCP was determined to be ASGE Difficulty Grade 3                            due to challenging cannulation because of abnormal                            anatomy. Successful completion of the procedure was                            aided by performing the maneuvers documented                            (below) in this report. The patient tolerated the                            procedure. Scope In: Scope Out: Findings:      A scout film of the abdomen was obtained. Surgical clips, consistent       with a previous cholecystectomy, were seen in the area of the right       upper quadrant of the abdomen. A biliary stent was visible on the scout       film.      The esophagus was successfully intubated under direct vision without       detailed examination of the pharynx, larynx, and associated structures.        A standard esophagogastroduodenoscopy scope was initially used for the       examination of the upper gastrointestinal tract and then transitioned to       a pediatric colonoscope. The scope was passed under direct vision       through the upper GI tract. No gross lesions were noted in the entire       esophagus. Evidence of a patent Billroth II gastrojejunostomy was found.       The gastrojejunal anastomosis was characterized by congestion. This was       traversed. The efferent limb was examined. The afferent limb was       examined and noted from previous tattoo placement from last ERCP.       Localized moderately congested mucosa without active bleeding and with       no stigmata of bleeding was found in the distal afferent jejunum that       did not allow me to pass initially with the endoscope or the       duodenoscope. The pediatric colonoscope was able to traverse this region       however. I then repositioned the patient into left-lateral, extreme       left-lateral and finally made decision to change to supine positioning.       The bile duct could not be cannulated with the pediatric colonoscope (as       had been issue previously). I then tried to pass a long 0.035 Jagwire       into the afferent limb and then when the patient was in the supine       position, I used it as a  fluoroscopic guide to traverse the very       angulated afferent limb and the duodenal diverticulum that were present       and eventually got to a better position with the duodenoscope. This took       nearly an hour to finally get into this position. A major papilla precut       sphincterotomy had been performed and one plastic biliary stent       originating in the biliary tree was emerging from the major papilla. The       stent was visibly patent.      I then used a short 0.035 inch Soft Antonietta Breach was passed into the biliary       tree to go alongside the stent to ensure that we could have an        opportunity to remove the stent and have good access. This was       successful. The short-nosed traction sphincterotome was passed over the       guidewire and the bile duct was then deeply cannulated. Contrast was       injected. I personally interpreted the bile duct images. Ductal flow of       contrast was adequate. Image quality was adequate. Contrast extended to       the hepatic ducts.      I then removed one stent was removed from the biliary tree using a       rat-toothed forceps with success.      A short 0.035 inch Soft Jagwire was passed into the biliary tree. The       short-nosed traction sphincterotome was passed over the guidewire and       the bile duct was then deeply cannulated. Contrast was injected.       Opacification of the entire biliary tree except for the cystic duct and       gallbladder was successful. The maximum diameter of the ducts was 12 mm.       The lower third of the main bile duct and middle third of the main bile       duct contained filling defects thought to be stones and sludge. Dilation       of the common bile duct with a 6 mm Hurricane and an 08-01-09 mm balloon       (to a maximum balloon size of 10 mm) as a sphincteroplasty was       successful. To discover objects, the biliary tree was swept with a       retrieval balloon. Sludge was swept from the duct. A few stones were       removed (largest was at least 14 mm in size (more of a sludgey stone).       One stone remained. An occlusion cholangiogram was performed that showed       no further significant biliary pathology other than what was noted here.       Due to the overall time that this procedure took I decided to restent       her for now. One 10 Fr by 5 cm plastic biliary stent with a single       external flap and a single internal flap was placed into the common bile       duct. Bile flowed through the stent. The stent was in good position.      A pancreatogram was not performed.  The duodenoscope was withdrawn from the patient. Impression:               - No gross lesions in esophagus.                           - Patent Billroth II gastrojejunostomy was found,                            characterized by congestion.                           - The efferent limb was normal in appearance.                           - Congested (edematous) jejunum in the afferent                            limb.                           - After significant issues as noted above we were                            able to cannulate once patient was in the supine                            position.                           - Prior major papilla sphincterotomy.                           - One visibly patent stent from the biliary tree                            was seen in the major papilla. This was removed.                           - Filling defects consistent with stones and sludge                            were seen on the cholangiogram.                           - Choledocholithiasis was found. Sphincteroplasty                            performed and balloon trawl did remove many stones.                            However one stone remained and a stent was inserted. Recommendation:           - The patient will be observed post-procedure,  until all discharge criteria are met.                           - Discharge patient to home.                           - Patient has a contact number available for                            emergencies. The signs and symptoms of potential                            delayed complications were discussed with the                            patient. Return to normal activities tomorrow.                            Written discharge instructions were provided to the                            patient.                           - Low fat diet.                           - Repeat ERCP in 2 months to remove stent. Put                             patient in supine position initially. Use pediatric                            colonoscope to follow and leave a wire to aid in                            cannulation (this hopefully decreases issues with                            accessing the afferent limb deeply.                           - Check liver enzymes (AST, ALT, alkaline                            phosphatase, bilirubin) in 2 weeks.                           - Watch for pancreatitis, bleeding, perforation,                            and cholangitis.                           - The findings and recommendations were discussed  with the patient.                           - The findings and recommendations were discussed                            with the patient's family. Procedure Code(s):        --- Professional ---                           [id number], Endoscopic retrograde                            cholangiopancreatography (ERCP); with removal and                            exchange of stent(s), biliary or pancreatic duct,                            including pre- and post-dilation and guide wire                            passage, when performed, including sphincterotomy,                            when performed, each stent exchanged                           [id number], Endoscopic retrograde                            cholangiopancreatography (ERCP); with removal of                            calculi/debris from biliary/pancreatic duct(s) Diagnosis Code(s):        --- Professional ---                           Z98.0, Intestinal bypass and anastomosis status                           K63.89, Other specified diseases of intestine                           Z96.89, Presence of other specified functional                            implants                           K80.50, Calculus of bile duct without cholangitis                            or cholecystitis without obstruction                            Z46.59, Encounter for fitting and adjustment of  other gastrointestinal appliance and device                           Z09, Encounter for follow-up examination after                            completed treatment for conditions other than                            malignant neoplasm                           Z90.49, Acquired absence of other specified parts                            of digestive tract                           Z98.890, Other specified postprocedural states                           R93.2, Abnormal findings on diagnostic imaging of                            liver and biliary tract CPT copyright 2019 [employer]. All rights reserved. The codes documented in this report are preliminary and upon coder review may  be revised to meet current compliance requirements. Justice Britain, MD 02/01/2022 12:17:34 PM Number of Addenda: 0

## 2022-02-01 NOTE — H&P (Signed)
GASTROENTEROLOGY PROCEDURE H&P NOTE   Primary Care Physician: Janie Morning, DO  HPI: Judith [name] is a 81 y.o. female who presents for repeat ERCP in setting of Billroth anatomy and stent removal/exchange with choledocholithiasis.  She is already status post cholecystectomy.  Past Medical History:  Diagnosis Date   Anemia    Arthritis    Biliary gastritis    Blood transfusion without reported diagnosis    2007   Cervical spine arthritis 04/05/2016   Controlled type 2 diabetes mellitus with diabetic neuropathy, with long-term current use of insulin ([place])    Depression    Diabetes mellitus type 2 with complications ([place])    Dilated bile duct    Duodenal ulcer disease    Encounter for long-term (current) use of insulin ([place])    Gallstones 01/17/2018   Gastric ulcer    Gastritis and gastroduodenitis    Gastroparesis    severe   GERD (gastroesophageal reflux disease)    Gout    tested for gout but was not   Heart murmur    Hyperlipidemia    in past not now   Hypertension    Hypokalemia, gastrointestinal losses    secondary to severe gastroparesis   Long-term insulin use in type 2 diabetes ([place]) 04/05/2016   Osteoporosis    just had bone density test possible    Peripheral neuropathy    Presence of cardiac pacemaker    S/P partial gastrectomy    due to severe gastric and gudoenal ulcers   Seasonal allergies    Sleep apnea    could not use CPAP   UTI (urinary tract infection)    Past Surgical History:  Procedure Laterality Date   BILIARY STENT PLACEMENT N/A 11/30/2021   Procedure: BILIARY STENT PLACEMENT;  Surgeon: Irving Copas., MD;  Location: [facility];  Service: Gastroenterology;  Laterality: N/A;   BIOPSY  11/30/2021   Procedure: BIOPSY;  Surgeon: Rush Landmark Telford Nab., MD;  Location: [facility];  Service: Gastroenterology;;   CHOLECYSTECTOMY N/A 12/06/2021   Procedure: LAPAROSCOPIC CHOLECYSTECTOMY, LYSIS OF ADHESIONS;  Surgeon: Erroll Luna,  MD;  Location: [facility];  Service: General;  Laterality: N/A;   COLONOSCOPY  02/11/2018   no polyps, + small internal hemorrhoids   ENDOSCOPIC RETROGRADE CHOLANGIOPANCREATOGRAPHY (ERCP) WITH PROPOFOL N/A 11/30/2021   Procedure: ENDOSCOPIC RETROGRADE CHOLANGIOPANCREATOGRAPHY (ERCP) WITH PROPOFOL;  Surgeon: Irving Copas., MD;  Location: [facility];  Service: Gastroenterology;  Laterality: N/A;   ESOPHAGOGASTRODUODENOSCOPY (EGD) WITH PROPOFOL N/A 11/30/2021   Procedure: ESOPHAGOGASTRODUODENOSCOPY (EGD) WITH PROPOFOL;  Surgeon: Rush Landmark Telford Nab., MD;  Location: [facility];  Service: Gastroenterology;  Laterality: N/A;   EUS N/A 12/12/2017   Procedure: UPPER ENDOSCOPIC ULTRASOUND (EUS) RADIAL;  Surgeon: [name], MD;  Location: [facility];  Service: Endoscopy;  Laterality: N/A;   GASTRECTOMY     JOINT REPLACEMENT     pace maker     PARTIAL GASTRECTOMY     SPHINCTEROTOMY  11/30/2021   Procedure: SPHINCTEROTOMY;  Surgeon: Mansouraty, Telford Nab., MD;  Location: [facility];  Service: Gastroenterology;;   SUBMUCOSAL TATTOO INJECTION  11/30/2021   Procedure: SUBMUCOSAL TATTOO INJECTION;  Surgeon: Irving Copas., MD;  Location: [facility];  Service: Gastroenterology;;   TONSILLECTOMY     45 years ago   UPPER GASTROINTESTINAL ENDOSCOPY     Current Facility-Administered Medications  Medication Dose Route Frequency Provider Last Rate Last Admin   0.9 %  sodium chloride infusion   Intravenous Continuous Mansouraty, Telford Nab., MD  fentaNYL (SUBLIMAZE) injection 25-50 mcg  25-50 mcg Intravenous Q5 min PRN Nolon Nations, MD       promethazine (PHENERGAN) injection 6.25-12.5 mg  6.25-12.5 mg Intravenous Q15 min PRN Nolon Nations, MD        Current Facility-Administered Medications:    0.9 %  sodium chloride infusion, , Intravenous, Continuous, Mansouraty, Telford Nab., MD   fentaNYL (SUBLIMAZE) injection 25-50 mcg, 25-50 mcg, Intravenous, Q5 min PRN,  Nolon Nations, MD   promethazine (PHENERGAN) injection 6.25-12.5 mg, 6.25-12.5 mg, Intravenous, Q15 min PRN, Nolon Nations, MD Allergies  Allergen Reactions   Asa [Aspirin] Other (See Comments)    Causes ulcers    Ibuprofen     Per mar   Tylenol [Acetaminophen]     Intolerance due to liver damage   Ciprofloxacin Rash   Sulfa Antibiotics Rash   Family History  Problem Relation Age of Onset   Diabetes Mother    Diabetes Father    Heart disease Father    Diabetes Sister    Heart disease Son    Colon cancer Neg Hx    Colon polyps Neg Hx    Rectal cancer Neg Hx    Stomach cancer Neg Hx    Esophageal cancer Neg Hx    Social History   Socioeconomic History   Marital status: Widowed    Spouse name: Not on file   Number of children: 3   Years of education: Not on file   Highest education level: High school graduate  Occupational History   Not on file  Tobacco Use   Smoking status: Never   Smokeless tobacco: Never  Vaping Use   Vaping Use: Never used  Substance and Sexual Activity   Alcohol use: No    Alcohol/week: 0.0 standard drinks   Drug use: No   Sexual activity: Not Currently  Other Topics Concern   Not on file  Social History Narrative   Used to live independently in [place]   However had significant issues with severe gastroparesis resulting in severe hypokalemia and general weakness and fall resulting in hospitalization and SNF rehab   Now (10/2017) living with her daughter in [place] until able to stabilize and regain independence.      May 2019   Patient living in assisted living, [place]   Social Determinants of Health   Financial Resource Strain: Not on file  Food Insecurity: Not on file  Transportation Needs: Not on file  Physical Activity: Not on file  Stress: Not on file  Social Connections: Not on file  Intimate Partner Violence: Not on file    Physical Exam: Today's Vitals   02/01/22 0740  BP: (!) 197/65  Pulse: 66  Resp: 11  Temp:  (!) 96.7 F (35.9 C)  TempSrc: Temporal  SpO2: 99%  Weight: 57.2 kg  Height: 5\' 4"  (1.626 m)  PainSc: 0-No pain   Body mass index is 21.63 kg/m. GEN: NAD EYE: Sclerae anicteric ENT: MMM CV: Non-tachycardic GI: Soft, NT/ND NEURO:  Alert & Oriented x 3  Lab Results: No results for input(s): WBC, HGB, HCT, PLT in the last 72 hours. BMET No results for input(s): NA, K, CL, CO2, GLUCOSE, BUN, CREATININE, CALCIUM in the last 72 hours. LFT No results for input(s): PROT, ALBUMIN, AST, ALT, ALKPHOS, BILITOT, BILIDIR, IBILI in the last 72 hours. PT/INR No results for input(s): LABPROT, INR in the last 72 hours.   Impression / Plan: This is a 81 y.o.female who presents for repeat ERCP in setting  of Billroth anatomy and stent removal/exchange with choledocholithiasis.  She is already status post cholecystectomy.  The risks of an ERCP were discussed at length, including but not limited to the risk of perforation, bleeding, abdominal pain, post-ERCP pancreatitis (while usually mild can be severe and even life threatening).  The risks and benefits of endoscopic evaluation/treatment were discussed with the patient and/or family; these include but are not limited to the risk of perforation, infection, bleeding, missed lesions, lack of diagnosis, severe illness requiring hospitalization, as well as anesthesia and sedation related illnesses.  The patient's history has been reviewed, patient examined, no change in status, and deemed stable for procedure.  The patient and/or family is agreeable to proceed.    Justice Britain, MD [facility] Office # [phone]

## 2022-02-01 NOTE — Anesthesia Procedure Notes (Signed)
Procedure Name: Intubation Date/Time: 02/01/2022 9:08 AM Performed by: Gwyndolyn Saxon, CRNA Pre-anesthesia Checklist: Patient identified and Emergency Drugs available Patient Re-evaluated:Patient Re-evaluated prior to induction Oxygen Delivery Method: Circle system utilized Preoxygenation: Pre-oxygenation with 100% oxygen Induction Type: IV induction Ventilation: Mask ventilation without difficulty Laryngoscope Size: Glidescope and 3 Grade View: Grade I Tube type: Oral Tube size: 7.0 mm Number of attempts: 1 Airway Equipment and Method: Rigid stylet Placement Confirmation: ETT inserted through vocal cords under direct vision, positive ETCO2 and breath sounds checked- equal and bilateral Secured at: 20 cm Tube secured with: Tape Dental Injury: Teeth and Oropharynx as per pre-operative assessment  Difficulty Due To: Difficulty was anticipated and Difficult Airway- due to reduced neck mobility

## 2022-02-01 NOTE — Transfer of Care (Signed)
Immediate Anesthesia Transfer of Care Note  Patient: Emelie Newsom  Procedure(s) Performed: ENDOSCOPIC RETROGRADE CHOLANGIOPANCREATOGRAPHY (ERCP) WITH PROPOFOL STENT REMOVAL REMOVAL OF STONES BILIARY DILATION BILIARY STENT PLACEMENT  Patient Location: PACU  Anesthesia Type:General  Level of Consciousness: drowsy  Airway & Oxygen Therapy: Patient Spontanous Breathing and Patient connected to face mask oxygen  Post-op Assessment: Report given to RN and Post -op Vital signs reviewed and stable  Post vital signs: Reviewed and stable  Last Vitals:  Vitals Value Taken Time  BP 207/73 02/01/22 1151  Temp    Pulse 65 02/01/22 1156  Resp 16 02/01/22 1156  SpO2 100 % 02/01/22 1156  Vitals shown include unvalidated device data.  Last Pain:  Vitals:   02/01/22 0740  TempSrc: Temporal  PainSc: 0-No pain         Complications: No notable events documented.

## 2022-02-01 NOTE — Anesthesia Preprocedure Evaluation (Addendum)
Anesthesia Evaluation  Patient identified by MRN, date of birth, ID band Patient awake    Reviewed: Allergy & Precautions, NPO status , Patient's Chart, lab work & pertinent test results  History of Anesthesia Complications (+) DIFFICULT AIRWAY and history of anesthetic complications  Airway Mallampati: III  TM Distance: >3 FB Neck ROM: Limited    Dental  (+) Dental Advisory Given, Chipped, Missing   Pulmonary sleep apnea and Continuous Positive Airway Pressure Ventilation ,    Pulmonary exam normal breath sounds clear to auscultation       Cardiovascular hypertension, Pt. on medications Normal cardiovascular exam+ pacemaker + Valvular Problems/Murmurs  Rhythm:Regular Rate:Normal  EKG 12/01/2021 Atrial sensed, ventricular paced rhythm   Neuro/Psych PSYCHIATRIC DISORDERS Depression Peripheral neuropathy  Neuromuscular disease    GI/Hepatic PUD, GERD  Medicated and Controlled,Elevated LFT's Choledocholithiasis   Endo/Other  diabetes, Well Controlled, Type 2, Insulin Dependent  Renal/GU negative Renal ROS  negative genitourinary   Musculoskeletal  (+) Arthritis , Osteoarthritis,  C-spine OA   Abdominal   Peds  Hematology  (+) Blood dyscrasia, anemia ,   Anesthesia Other Findings   Reproductive/Obstetrics                           Anesthesia Physical  Anesthesia Plan  ASA: 3  Anesthesia Plan: General   Post-op Pain Management: Minimal or no pain anticipated   Induction: Intravenous, Rapid sequence and Cricoid pressure planned  PONV Risk Score and Plan: 3 and Treatment may vary due to age or medical condition, Ondansetron and Dexamethasone  Airway Management Planned: Oral ETT and Video Laryngoscope Planned  Additional Equipment: None  Intra-op Plan:   Post-operative Plan: Extubation in OR  Informed Consent: I have reviewed the patients History and Physical, chart, labs and  discussed the procedure including the risks, benefits and alternatives for the proposed anesthesia with the patient or authorized representative who has indicated his/her understanding and acceptance.     Dental advisory given  Plan Discussed with: CRNA  Anesthesia Plan Comments:        Anesthesia Quick Evaluation

## 2022-02-02 NOTE — Anesthesia Postprocedure Evaluation (Signed)
Anesthesia Post Note  Patient: Judith [name]  Procedure(s) Performed: ENDOSCOPIC RETROGRADE CHOLANGIOPANCREATOGRAPHY (ERCP) WITH PROPOFOL STENT REMOVAL REMOVAL OF STONES BILIARY DILATION BILIARY STENT PLACEMENT     Patient location during evaluation: PACU Anesthesia Type: General Level of consciousness: sedated and patient cooperative Pain management: pain level controlled Vital Signs Assessment: post-procedure vital signs reviewed and stable Respiratory status: spontaneous breathing Cardiovascular status: stable Anesthetic complications: no   No notable events documented.  Last Vitals:  Vitals:   02/01/22 1221 02/01/22 1235  BP: (!) 179/62 (!) 182/81  Pulse: 68 70  Resp: 15 13  Temp:  (!) 36.3 C  SpO2: 93% 92%    Last Pain:  Vitals:   02/01/22 1235  TempSrc:   PainSc: 0-No pain                 Nolon Nations

## 2022-02-03 ENCOUNTER — Encounter (HOSPITAL_COMMUNITY): Payer: Self-pay | Admitting: Gastroenterology

## 2022-02-16 ENCOUNTER — Telehealth: Payer: Self-pay | Admitting: Gastroenterology

## 2022-02-16 ENCOUNTER — Other Ambulatory Visit: Payer: Self-pay

## 2022-02-16 DIAGNOSIS — K838 Other specified diseases of biliary tract: Secondary | ICD-10-CM

## 2022-02-16 DIAGNOSIS — K8050 Calculus of bile duct without cholangitis or cholecystitis without obstruction: Secondary | ICD-10-CM

## 2022-02-16 DIAGNOSIS — R7989 Other specified abnormal findings of blood chemistry: Secondary | ICD-10-CM

## 2022-02-16 DIAGNOSIS — K2960 Other gastritis without bleeding: Secondary | ICD-10-CM

## 2022-02-16 NOTE — Telephone Encounter (Signed)
ERCP scheduled, pt instructed and medications reviewed.  Patient instructions mailed to home and sent to My Chart.  Patient to call with any questions or concerns.  

## 2022-02-16 NOTE — Telephone Encounter (Signed)
ERCP scheduled for 04/09/22 at [facility] with GM at 845 am Left message on machine to call back

## 2022-02-16 NOTE — Telephone Encounter (Signed)
Patients daughter calling to follow up on the repeat ERCP procedure that was recommended.

## 2022-03-06 ENCOUNTER — Encounter (INDEPENDENT_AMBULATORY_CARE_PROVIDER_SITE_OTHER): Payer: Medicare Other | Admitting: Ophthalmology

## 2022-04-04 ENCOUNTER — Encounter (HOSPITAL_COMMUNITY): Payer: Self-pay | Admitting: Gastroenterology

## 2022-04-08 NOTE — Anesthesia Preprocedure Evaluation (Addendum)
Anesthesia Evaluation  ?Patient identified by MRN, date of birth, ID band ?Patient awake ? ? ? ?Reviewed: ?Allergy & Precautions, NPO status , Patient's Chart, lab work & pertinent test results ? ?History of Anesthesia Complications ?(+) DIFFICULT AIRWAY and history of anesthetic complications ? ?Airway ?Mallampati: II ? ?TM Distance: >3 FB ?Neck ROM: Full ? ? ? Dental ? ?(+) Chipped, Missing, Dental Advisory Given ?  ?Pulmonary ?sleep apnea and Continuous Positive Airway Pressure Ventilation ,  ?  ?breath sounds clear to auscultation ? ? ? ? ? ? Cardiovascular ?hypertension, Pt. on medications ?+ pacemaker + Valvular Problems/Murmurs  ?Rhythm:Regular Rate:Normal ? ?EKG 12/01/2021 ?Atrial sensed, ventricular paced rhythm ?  ?Neuro/Psych ?PSYCHIATRIC DISORDERS Depression Peripheral neuropathy ? Neuromuscular disease   ? GI/Hepatic ?PUD, GERD  Medicated and Controlled,Elevated LFT's ?Choledocholithiasis ?  ?Endo/Other  ?diabetes, Well Controlled, Type 2, Insulin Dependent ? Renal/GU ?negative Renal ROS  ?negative genitourinary ?  ?Musculoskeletal ? ?(+) Arthritis , Osteoarthritis,  C-spine OA  ? Abdominal ?  ?Peds ? Hematology ? ?(+) Blood dyscrasia, anemia ,   ?Anesthesia Other Findings ? ? Reproductive/Obstetrics ? ?  ? ? ? ? ? ? ? ? ? ? ? ? ? ?  ?  ? ? ? ? ? ? ? ?Anesthesia Physical ? ?Anesthesia Plan ? ?ASA: 3 ? ?Anesthesia Plan: General  ? ?Post-op Pain Management: Minimal or no pain anticipated  ? ?Induction: Intravenous, Rapid sequence and Cricoid pressure planned ? ?PONV Risk Score and Plan: 3 and Treatment may vary due to age or medical condition, Ondansetron and Dexamethasone ? ?Airway Management Planned: Oral ETT and Video Laryngoscope Planned ? ?Additional Equipment: None ? ?Intra-op Plan:  ? ?Post-operative Plan: Extubation in OR ? ?Informed Consent: I have reviewed the patients History and Physical, chart, labs and discussed the procedure including the risks, benefits and  alternatives for the proposed anesthesia with the patient or authorized representative who has indicated his/her understanding and acceptance.  ? ? ? ?Dental advisory given ? ?Plan Discussed with: CRNA ? ?Anesthesia Plan Comments:   ? ? ? ? ? ?Anesthesia Quick Evaluation ? ?

## 2022-04-09 ENCOUNTER — Ambulatory Visit (HOSPITAL_COMMUNITY): Payer: Medicare Other

## 2022-04-09 ENCOUNTER — Encounter (HOSPITAL_COMMUNITY)
Admission: RE | Disposition: A | Discharge status: Home / Self Care | Payer: Self-pay | Source: Ambulatory Visit | Attending: Gastroenterology

## 2022-04-09 ENCOUNTER — Other Ambulatory Visit: Payer: Self-pay

## 2022-04-09 ENCOUNTER — Ambulatory Visit (HOSPITAL_BASED_OUTPATIENT_CLINIC_OR_DEPARTMENT_OTHER): Payer: Medicare Other | Admitting: Certified Registered Nurse Anesthetist

## 2022-04-09 ENCOUNTER — Ambulatory Visit (HOSPITAL_COMMUNITY): Payer: Medicare Other | Admitting: Certified Registered Nurse Anesthetist

## 2022-04-09 ENCOUNTER — Encounter (HOSPITAL_COMMUNITY): Payer: Self-pay | Admitting: Gastroenterology

## 2022-04-09 ENCOUNTER — Ambulatory Visit (HOSPITAL_COMMUNITY)
Admission: RE | Admit: 2022-04-09 | Discharge: 2022-04-09 | Disposition: A | Discharge status: Home / Self Care | Payer: Medicare Other | Source: Ambulatory Visit | Attending: Gastroenterology | Admitting: Gastroenterology

## 2022-04-09 DIAGNOSIS — K449 Diaphragmatic hernia without obstruction or gangrene: Secondary | ICD-10-CM | POA: Insufficient documentation

## 2022-04-09 DIAGNOSIS — K2960 Other gastritis without bleeding: Secondary | ICD-10-CM

## 2022-04-09 DIAGNOSIS — D759 Disease of blood and blood-forming organs, unspecified: Secondary | ICD-10-CM | POA: Insufficient documentation

## 2022-04-09 DIAGNOSIS — Z4589 Encounter for adjustment and management of other implanted devices: Secondary | ICD-10-CM | POA: Diagnosis not present

## 2022-04-09 DIAGNOSIS — G4730 Sleep apnea, unspecified: Secondary | ICD-10-CM | POA: Insufficient documentation

## 2022-04-09 DIAGNOSIS — Z4659 Encounter for fitting and adjustment of other gastrointestinal appliance and device: Secondary | ICD-10-CM

## 2022-04-09 DIAGNOSIS — Z980 Intestinal bypass and anastomosis status: Secondary | ICD-10-CM

## 2022-04-09 DIAGNOSIS — K838 Other specified diseases of biliary tract: Secondary | ICD-10-CM

## 2022-04-09 DIAGNOSIS — K2289 Other specified disease of esophagus: Secondary | ICD-10-CM | POA: Diagnosis not present

## 2022-04-09 DIAGNOSIS — E119 Type 2 diabetes mellitus without complications: Secondary | ICD-10-CM | POA: Insufficient documentation

## 2022-04-09 DIAGNOSIS — K5710 Diverticulosis of small intestine without perforation or abscess without bleeding: Secondary | ICD-10-CM | POA: Insufficient documentation

## 2022-04-09 DIAGNOSIS — R7989 Other specified abnormal findings of blood chemistry: Secondary | ICD-10-CM

## 2022-04-09 DIAGNOSIS — Z9049 Acquired absence of other specified parts of digestive tract: Secondary | ICD-10-CM | POA: Diagnosis not present

## 2022-04-09 DIAGNOSIS — D649 Anemia, unspecified: Secondary | ICD-10-CM | POA: Insufficient documentation

## 2022-04-09 DIAGNOSIS — I10 Essential (primary) hypertension: Secondary | ICD-10-CM | POA: Insufficient documentation

## 2022-04-09 DIAGNOSIS — Z8711 Personal history of peptic ulcer disease: Secondary | ICD-10-CM | POA: Insufficient documentation

## 2022-04-09 DIAGNOSIS — K8050 Calculus of bile duct without cholangitis or cholecystitis without obstruction: Secondary | ICD-10-CM | POA: Diagnosis present

## 2022-04-09 DIAGNOSIS — Z794 Long term (current) use of insulin: Secondary | ICD-10-CM | POA: Diagnosis not present

## 2022-04-09 DIAGNOSIS — Z950 Presence of cardiac pacemaker: Secondary | ICD-10-CM | POA: Diagnosis not present

## 2022-04-09 HISTORY — PX: ENDOSCOPIC RETROGRADE CHOLANGIOPANCREATOGRAPHY (ERCP) WITH PROPOFOL: SHX5810

## 2022-04-09 HISTORY — PX: STENT REMOVAL: SHX6421

## 2022-04-09 HISTORY — PX: BILIARY DILATION: SHX6850

## 2022-04-09 HISTORY — PX: REMOVAL OF STONES: SHX5545

## 2022-04-09 HISTORY — PX: BIOPSY: SHX5522

## 2022-04-09 LAB — GLUCOSE, CAPILLARY: Glucose-Capillary: 168 mg/dL — ABNORMAL HIGH (ref 70–99)

## 2022-04-09 SURGERY — ENDOSCOPIC RETROGRADE CHOLANGIOPANCREATOGRAPHY (ERCP) WITH PROPOFOL
Anesthesia: General

## 2022-04-09 MED ORDER — ONDANSETRON HCL 4 MG/2ML IJ SOLN
INTRAMUSCULAR | Status: DC | PRN
  Administered 2022-04-09: 4 mg via INTRAVENOUS

## 2022-04-09 MED ORDER — PHENYLEPHRINE HCL-NACL 20-0.9 MG/250ML-% IV SOLN
INTRAVENOUS | Status: DC | PRN
  Administered 2022-04-09: 25 ug/min via INTRAVENOUS

## 2022-04-09 MED ORDER — SUGAMMADEX SODIUM 200 MG/2ML IV SOLN
INTRAVENOUS | Status: DC | PRN
  Administered 2022-04-09: 200 mg via INTRAVENOUS

## 2022-04-09 MED ORDER — FENTANYL CITRATE (PF) 100 MCG/2ML IJ SOLN
INTRAMUSCULAR | Status: AC
Start: 2022-04-09 — End: ?
  Filled 2022-04-09: qty 2

## 2022-04-09 MED ORDER — INDOMETHACIN 50 MG RE SUPP
RECTAL | Status: AC
  Filled 2022-04-09: qty 2

## 2022-04-09 MED ORDER — LIDOCAINE 2% (20 MG/ML) 5 ML SYRINGE
INTRAMUSCULAR | Status: DC | PRN
  Administered 2022-04-09: 50 mg via INTRAVENOUS

## 2022-04-09 MED ORDER — DROPERIDOL 2.5 MG/ML IJ SOLN
0.6250 mg | Freq: Once | INTRAMUSCULAR | Status: DC | PRN
  Filled 2022-04-09: qty 2

## 2022-04-09 MED ORDER — SODIUM CHLORIDE 0.9 % IV SOLN
1.5000 g | Freq: Once | INTRAVENOUS | Status: AC
  Administered 2022-04-09: 1.5 g via INTRAVENOUS
  Filled 2022-04-09: qty 1.5

## 2022-04-09 MED ORDER — GLUCAGON HCL RDNA (DIAGNOSTIC) 1 MG IJ SOLR
INTRAMUSCULAR | Status: AC
  Filled 2022-04-09: qty 2

## 2022-04-09 MED ORDER — MEPERIDINE HCL 50 MG/ML IJ SOLN: 6.2500 mg | INTRAMUSCULAR | Status: DC | PRN

## 2022-04-09 MED ORDER — INDOMETHACIN 50 MG RE SUPP
RECTAL | Status: DC | PRN
  Administered 2022-04-09: 100 mg via RECTAL

## 2022-04-09 MED ORDER — FENTANYL CITRATE (PF) 100 MCG/2ML IJ SOLN
INTRAMUSCULAR | Status: DC | PRN
  Administered 2022-04-09: 25 ug via INTRAVENOUS
  Administered 2022-04-09: 50 ug via INTRAVENOUS
  Administered 2022-04-09: 25 ug via INTRAVENOUS

## 2022-04-09 MED ORDER — SODIUM CHLORIDE 0.9 % IV SOLN
INTRAVENOUS | Status: DC | PRN
  Administered 2022-04-09: 100 mL

## 2022-04-09 MED ORDER — GLUCAGON HCL RDNA (DIAGNOSTIC) 1 MG IJ SOLR
INTRAMUSCULAR | Status: DC | PRN
  Administered 2022-04-09 (×3): .25 mg via INTRAVENOUS

## 2022-04-09 MED ORDER — AMOXICILLIN-POT CLAVULANATE 875-125 MG PO TABS: 1.0000 | ORAL_TABLET | Freq: Two times a day (BID) | ORAL | 0 refills | Status: AC

## 2022-04-09 MED ORDER — ROCURONIUM BROMIDE 10 MG/ML (PF) SYRINGE
PREFILLED_SYRINGE | INTRAVENOUS | Status: DC | PRN
  Administered 2022-04-09: 40 mg via INTRAVENOUS
  Administered 2022-04-09 (×2): 10 mg via INTRAVENOUS

## 2022-04-09 MED ORDER — DEXAMETHASONE SODIUM PHOSPHATE 10 MG/ML IJ SOLN
INTRAMUSCULAR | Status: DC | PRN
  Administered 2022-04-09: 4 mg via INTRAVENOUS

## 2022-04-09 MED ORDER — PHENYLEPHRINE 40 MCG/ML (10ML) SYRINGE FOR IV PUSH (FOR BLOOD PRESSURE SUPPORT)
PREFILLED_SYRINGE | INTRAVENOUS | Status: DC | PRN
  Administered 2022-04-09: 120 ug via INTRAVENOUS
  Administered 2022-04-09: 80 ug via INTRAVENOUS

## 2022-04-09 MED ORDER — LACTATED RINGERS IV SOLN
INTRAVENOUS | Status: DC
Start: 2022-04-09 — End: 2022-04-09

## 2022-04-09 MED ORDER — PROPOFOL 10 MG/ML IV BOLUS
INTRAVENOUS | Status: AC
  Filled 2022-04-09: qty 20

## 2022-04-09 MED ORDER — PROPOFOL 10 MG/ML IV BOLUS
INTRAVENOUS | Status: DC | PRN
  Administered 2022-04-09: 80 mg via INTRAVENOUS

## 2022-04-09 MED ORDER — SODIUM CHLORIDE 0.9 % IV SOLN: INTRAVENOUS | Status: DC

## 2022-04-09 NOTE — Transfer of Care (Signed)
Immediate Anesthesia Transfer of Care Note ? ?Patient: KIELEY AKTER ? ?Procedure(s) Performed: ENDOSCOPIC RETROGRADE CHOLANGIOPANCREATOGRAPHY (ERCP) WITH PROPOFOL ?BIOPSY ?STENT REMOVAL ?BILIARY DILATION ?REMOVAL OF STONES ? ?Patient Location: PACU and Endoscopy Unit ? ?Anesthesia Type:General ? ?Level of Consciousness: drowsy and patient cooperative ? ?Airway & Oxygen Therapy: Patient Spontanous Breathing and Patient connected to face mask oxygen ? ?Post-op Assessment: Report given to RN and Post -op Vital signs reviewed and stable ? ?Post vital signs: Reviewed and stable ? ?Last Vitals:  ?Vitals Value Taken Time  ?BP 173/77 04/09/22 1137  ?Temp 36.4 ?C 04/09/22 1137  ?Pulse 77 04/09/22 1138  ?Resp 14 04/09/22 1138  ?SpO2 100 % 04/09/22 1138  ?Vitals shown include unvalidated device data. ? ?Last Pain:  ?Vitals:  ? 04/09/22 1137  ?TempSrc: Tympanic  ?PainSc: Asleep  ?   ? ?  ? ?Complications: No notable events documented. ?

## 2022-04-09 NOTE — Op Note (Signed)
[facility] ?Patient Name: Judith [name] ?Procedure Date: 04/09/2022 ?MRN: [id number] ?Attending MD: Justice Britain , MD ?Date of Birth: 1941-04-24 ?CSN: [id number] ?Age: 81 ?Admit Type: Outpatient ?Procedure:                ERCP ?Indications:              Bile duct stone(s), Stent change ?Providers:                Justice Britain, MD, Jeanella Cara, RN,  ?                          Tyna Jaksch Technician ?Referring MD:             Lajuan Lines. Hilarie Fredrickson, MD, Janie Morning ?Medicines:                General Anesthesia, Unasyn 1.5 g IV, Indomethacin  ?                          100 mg PR, Glucagon 1 mg IV ?Complications:            No immediate complications. ?Estimated Blood Loss:     Estimated blood loss was minimal. ?Procedure:                Pre-Anesthesia Assessment: ?                          - Prior to the procedure, a History and Physical  ?                          was performed, and patient medications and  ?                          allergies were reviewed. The patient's tolerance of  ?                          previous anesthesia was also reviewed. The risks  ?                          and benefits of the procedure and the sedation  ?                          options and risks were discussed with the patient.  ?                          All questions were answered, and informed consent  ?                          was obtained. Prior Anticoagulants: The patient has  ?                          taken no previous anticoagulant or antiplatelet  ?                          agents. ASA Grade Assessment: III - A patient with  ?  severe systemic disease. After reviewing the risks  ?                          and benefits, the patient was deemed in  ?                          satisfactory condition to undergo the procedure. ?                          After obtaining informed consent, the scope was  ?                          passed under direct vision. Throughout the  ?                           procedure, the patient's blood pressure, pulse, and  ?                          oxygen saturations were monitored continuously. The  ?                          [id number]-HQ190L ([id number]) Olympus colonoscope was  ?                          introduced through the mouth, and used to inject  ?                          contrast into and used to locate the major papilla.  ?                          The [id number]-[id number] ([id number]) Olympus duodenoscope was  ?                          introduced through the mouth, and used to inject  ?                          contrast into and used to inject contrast into the  ?                          bile duct. The ERCP was determined to be ASGE  ?                          Difficulty Grade 3 due to post-surgical anatomy.  ?                          Successful completion of the procedure was aided by  ?                          performing the maneuvers documented (below) in this  ?                          report. The patient tolerated the procedure. ?Scope In: ?Scope Out: ?Findings: ?     A scout film  of the abdomen was obtained. Surgical clips, consistent  ?     with a previous cholecystectomy, were seen in the area of the right  ?     upper quadrant of the abdomen. ?     The esophagus was successfully intubated under direct vision without  ?     detailed examination of the pharynx, larynx, and associated structures.  ?     A pediatric colonoscope scope was used for the examination of the upper  ?     gastrointestinal tract. The scope was passed under direct vision through  ?     the upper GI tract. White nummular lesions were noted in the distal  ?     esophagus - these were biopsied to rule out Candida. No other gross  ?     mucosal lesions of the esophagus noted. A 2 cm hiatal hernia was  ?     present. Evidence of a Billroth II anastomosis was found in the gastric  ?     body. This was characterized by inflammation. The examined efferent limb  ?     of jejunum was examined  (previous tattoo was noted to allow [place] to focus  ?     today's examination). After beginning the procedure in supine  ?     positioning (the way this was successful with last procedure) we did  ?     achieve duodenal intubation. Duodenal diverticulae were noted.  ?     Eventually the pylorus in retroflexion was found. A long 0.035 inch Soft  ?     Antonietta Breach was passed into the duodenum, to aid in duodenoscope passage. We  ?     transitioned to the duodenoscope. In supine position and in left lateral  ?     position, I could not traverse into the duodenum, to significant  ?     angulation. Pressure did not help as well. We decided to transition to  ?     typice ERCP prone positioning. I removed the duodenoscope and the wire.  ?     I then replaced the pediatric colonoscope and eventually left a wire in  ?     the duodenum, as I had previously. In the prone ERCP position, the scope  ?     got into the angled region once again. We then put her into a partial  ?     left-lateral position and gave abdominal pressure, and we were able to  ?     traverse into the duodenum. One plastic biliary stent originating in the  ?     biliary tree was emerging from the major papilla. The stent was visibly  ?     patent. A major papilla precut fistulotomy had been performed  ?     previously. The stent was removed from the biliary tree using a snare. ?     A short 0.035 inch Soft Jagwire was passed into the biliary tree. The  ?     Hydratome sphincterotome was passed over the guidewire and the bile duct  ?     was then deeply cannulated. Contrast was injected. I personally  ?     interpreted the bile duct images. Ductal flow of contrast was adequate.  ?     Image quality was adequate. Contrast extended to the hepatic ducts.  ?     Opacification of the entire biliary tree except for the gallbladder was  ?  successful. The lower third of the main bile duct and middle third of  ?     the main bile duct contained filling defects thought  to be stones and  ?     sludge. The upper third of the main bile duct was moderately dilated.  ?     The largest diameter was 15 mm but tapered to approximately 8 mm  ?     distally. Dilation of the distal common bile duct with an 08-01-09 mm x  ?     5.5 cm CRE balloon (to a maximum balloon size of 10 mm) dilator was  ?     successful as a sphincteroplasty for a total of 4-minutes. The biliary  ?     tree was swept with a retrieval balloon starting at the bifurcation.  ?     Sludge was swept from the duct. A few stone and stone fragments were  ?     removed. No stones remained. An occlusion cholangiogram was performed  ?     that showed no further significant biliary pathology. I noted a possible  ?     cystic duct air bubble, but could not access the cystic duct  ?     unfortunately. ?     A pancreatogram was not performed. ?     The duodenoscope was withdrawn from the patient. ?Impression:               - Distal white nummular lesions in esophageal  ?                          mucosa - biopsied. ?                          - 2 cm hiatal hernia. ?                          - A Billroth II anastomosis was found,  ?                          characterized by inflammation. ?                          - Normal examined efferent jejunum. ?                          - Difficult access to the biliary tree as noted  ?                          above requiring multiple position changes and scope  ?                          changes to give [place] the best opportunity to help  ?                          this patient. ?                          - One visibly patent stent from the biliary tree  ?  was seen in the major papilla. This was removed. ?                          - Prior major papilla fistulotomy was present. ?                          - The upper third of the main bile duct was  ?                          moderately dilated. ?                          - Filling defects consistent with stones and sludge  ?                           was seen on the cholangiogram. ?                          - Choledocholithiasis was found. Complete removal  ?                          was accomplished by sweeping after sphinc

## 2022-04-09 NOTE — Anesthesia Procedure Notes (Signed)
Procedure Name: Intubation ?Date/Time: 04/09/2022 9:14 AM ?Performed by: Milford Cage, CRNA ?Pre-anesthesia Checklist: Patient identified, Emergency Drugs available, Suction available and Patient being monitored ?Patient Re-evaluated:Patient Re-evaluated prior to induction ?Oxygen Delivery Method: Circle system utilized ?Preoxygenation: Pre-oxygenation with 100% oxygen ?Induction Type: IV induction ?Ventilation: Mask ventilation without difficulty ?Laryngoscope Size: Glidescope and 3 ?Grade View: Grade I ?Tube type: Oral ?Tube size: 7.0 mm ?Number of attempts: 1 ?Airway Equipment and Method: Stylet and Oral airway ?Placement Confirmation: ETT inserted through vocal cords under direct vision, positive ETCO2 and breath sounds checked- equal and bilateral ?Secured at: 20 cm ?Tube secured with: Tape ?Dental Injury: Teeth and Oropharynx as per pre-operative assessment  ?Difficulty Due To: Difficulty was anticipated and Difficult Airway- due to anterior larynx ?Future Recommendations: Recommend- induction with short-acting agent, and alternative techniques readily available ? ? ? ? ?

## 2022-04-09 NOTE — H&P (Signed)
? ?GASTROENTEROLOGY PROCEDURE H&P NOTE  ? ?Primary Care Physician: ?Janie Morning, DO ? ?HPI: ?Judith [name] is a 81 y.o. female who presents for ERCP for stent removal and attempt at final removal of choledocholithiasis. ? ?Past Medical History:  ?Diagnosis Date  ? Anemia   ? Arthritis   ? Biliary gastritis   ? Blood transfusion without reported diagnosis   ? 2007  ? Cervical spine arthritis 04/05/2016  ? Controlled type 2 diabetes mellitus with diabetic neuropathy, with long-term current use of insulin ([place])   ? Depression   ? Diabetes mellitus type 2 with complications ([place])   ? Dilated bile duct   ? Duodenal ulcer disease   ? Encounter for long-term (current) use of insulin ([place])   ? Gallstones 01/17/2018  ? Gastric ulcer   ? Gastritis and gastroduodenitis   ? Gastroparesis   ? severe  ? GERD (gastroesophageal reflux disease)   ? Gout   ? tested for gout but was not  ? Heart murmur   ? Hyperlipidemia   ? in past not now  ? Hypertension   ? Hypokalemia, gastrointestinal losses   ? secondary to severe gastroparesis  ? Long-term insulin use in type 2 diabetes ([place]) 04/05/2016  ? Osteoporosis   ? just had bone density test possible   ? Peripheral neuropathy   ? Presence of cardiac pacemaker   ? S/P partial gastrectomy   ? due to severe gastric and gudoenal ulcers  ? Seasonal allergies   ? Sleep apnea   ? could not use CPAP  ? UTI (urinary tract infection)   ? ?Past Surgical History:  ?Procedure Laterality Date  ? BILIARY DILATION  02/01/2022  ? Procedure: BILIARY DILATION;  Surgeon: Rush Landmark Telford Nab., MD;  Location: [facility];  Service: Gastroenterology;;  ? BILIARY STENT PLACEMENT N/A 11/30/2021  ? Procedure: BILIARY STENT PLACEMENT;  Surgeon: Rush Landmark Telford Nab., MD;  Location: [facility];  Service: Gastroenterology;  Laterality: N/A;  ? BILIARY STENT PLACEMENT  02/01/2022  ? Procedure: BILIARY STENT PLACEMENT;  Surgeon: Irving Copas., MD;  Location: [facility];  Service: Gastroenterology;;  ?  BIOPSY  11/30/2021  ? Procedure: BIOPSY;  Surgeon: Irving Copas., MD;  Location: [facility];  Service: Gastroenterology;;  ? CHOLECYSTECTOMY N/A 12/06/2021  ? Procedure: LAPAROSCOPIC CHOLECYSTECTOMY, LYSIS OF ADHESIONS;  Surgeon: Erroll Luna, MD;  Location: [facility];  Service: General;  Laterality: N/A;  ? COLONOSCOPY  02/11/2018  ? no polyps, + small internal hemorrhoids  ? ENDOSCOPIC RETROGRADE CHOLANGIOPANCREATOGRAPHY (ERCP) WITH PROPOFOL N/A 11/30/2021  ? Procedure: ENDOSCOPIC RETROGRADE CHOLANGIOPANCREATOGRAPHY (ERCP) WITH PROPOFOL;  Surgeon: Rush Landmark Telford Nab., MD;  Location: [facility];  Service: Gastroenterology;  Laterality: N/A;  ? ENDOSCOPIC RETROGRADE CHOLANGIOPANCREATOGRAPHY (ERCP) WITH PROPOFOL N/A 02/01/2022  ? Procedure: ENDOSCOPIC RETROGRADE CHOLANGIOPANCREATOGRAPHY (ERCP) WITH PROPOFOL;  Surgeon: Rush Landmark Telford Nab., MD;  Location: [facility];  Service: Gastroenterology;  Laterality: N/A;  ? ESOPHAGOGASTRODUODENOSCOPY (EGD) WITH PROPOFOL N/A 11/30/2021  ? Procedure: ESOPHAGOGASTRODUODENOSCOPY (EGD) WITH PROPOFOL;  Surgeon: Rush Landmark Telford Nab., MD;  Location: [facility];  Service: Gastroenterology;  Laterality: N/A;  ? EUS N/A 12/12/2017  ? Procedure: UPPER ENDOSCOPIC ULTRASOUND (EUS) RADIAL;  Surgeon: [name], MD;  Location: [facility];  Service: Endoscopy;  Laterality: N/A;  ? GASTRECTOMY    ? JOINT REPLACEMENT    ? pace maker    ? PARTIAL GASTRECTOMY    ? REMOVAL OF STONES  02/01/2022  ? Procedure: REMOVAL OF STONES;  Surgeon: Rush Landmark Telford Nab., MD;  Location: [facility];  Service: Gastroenterology;;  ? SPHINCTEROTOMY  11/30/2021  ? Procedure: SPHINCTEROTOMY;  Surgeon: Rush Landmark Telford Nab., MD;  Location: [facility];  Service: Gastroenterology;;  ? [place]  02/01/2022  ? Procedure: STENT REMOVAL;  Surgeon: Irving Copas., MD;  Location: [facility];  Service: Gastroenterology;;  ? [place] INJECTION  11/30/2021  ? Procedure:  SUBMUCOSAL TATTOO INJECTION;  Surgeon: Irving Copas., MD;  Location: [facility];  Service: Gastroenterology;;  ? TONSILLECTOMY    ? 45 years ago  ? UPPER GASTROINTESTINAL ENDOSCOPY    ? ?Current Facility-Administered Medications  ?Medication Dose Route Frequency Provider Last Rate Last Admin  ? 0.9 %  sodium chloride infusion   Intravenous Continuous Mansouraty, Telford Nab., MD      ? droperidol (INAPSINE) 2.5 MG/ML injection 0.625 mg  0.625 mg Intravenous Once PRN Nolon Nations, MD      ? meperidine (DEMEROL) injection 6.25-12.5 mg  6.25-12.5 mg Intravenous Q5 min PRN Nolon Nations, MD      ? ? ?Current Facility-Administered Medications:  ?  0.9 %  sodium chloride infusion, , Intravenous, Continuous, Mansouraty, Telford Nab., MD ?  droperidol (INAPSINE) 2.5 MG/ML injection 0.625 mg, 0.625 mg, Intravenous, Once PRN, Nolon Nations, MD ?  meperidine (DEMEROL) injection 6.25-12.5 mg, 6.25-12.5 mg, Intravenous, Q5 min PRN, Nolon Nations, MD ?Allergies  ?Allergen Reactions  ? Asa [Aspirin] Other (See Comments)  ?  Causes ulcers ?  ? Ibuprofen   ?  Per mar  ? Tylenol [Acetaminophen]   ?  Intolerance due to liver damage  ? Ciprofloxacin Rash  ? Sulfa Antibiotics Rash  ? ?Family History  ?Problem Relation Age of Onset  ? Diabetes Mother   ? Diabetes Father   ? Heart disease Father   ? Diabetes Sister   ? Heart disease Son   ? Colon cancer Neg Hx   ? Colon polyps Neg Hx   ? Rectal cancer Neg Hx   ? Stomach cancer Neg Hx   ? Esophageal cancer Neg Hx   ? ?Social History  ? ?Socioeconomic History  ? Marital status: Widowed  ?  Spouse name: Not on file  ? Number of children: 3  ? Years of education: Not on file  ? Highest education level: High school graduate  ?Occupational History  ? Not on file  ?Tobacco Use  ? Smoking status: Never  ? Smokeless tobacco: Never  ?Vaping Use  ? Vaping Use: Never used  ?Substance and Sexual Activity  ? Alcohol use: No  ?  Alcohol/week: 0.0 standard drinks  ? Drug use: No  ?  Sexual activity: Not Currently  ?Other Topics Concern  ? Not on file  ?Social History Narrative  ? Used to live independently in [place]  ? However had significant issues with severe gastroparesis resulting in severe hypokalemia and general weakness and fall resulting in hospitalization and SNF rehab  ? Now (10/2017) living with her daughter in [place] until able to stabilize and regain independence.  ?   ? May 2019  ? Patient living in assisted living, [place]  ? ?Social Determinants of Health  ? ?Financial Resource Strain: Not on file  ?Food Insecurity: Not on file  ?Transportation Needs: Not on file  ?Physical Activity: Not on file  ?Stress: Not on file  ?Social Connections: Not on file  ?Intimate Partner Violence: Not on file  ? ? ?Physical Exam: ?There were no vitals filed for this visit. ?There is no height or weight on file to  calculate BMI. ?GEN: NAD ?EYE: Sclerae anicteric ?ENT: MMM ?CV: Non-tachycardic ?GI: Soft, NT/ND ?NEURO:  Alert & Oriented x 3 ? ?Lab Results: ?No results for input(s): WBC, HGB, HCT, PLT in the last 72 hours. ?BMET ?No results for input(s): NA, K, CL, CO2, GLUCOSE, BUN, CREATININE, CALCIUM in the last 72 hours. ?LFT ?No results for input(s): PROT, ALBUMIN, AST, ALT, ALKPHOS, BILITOT, BILIDIR, IBILI in the last 72 hours. ?PT/INR ?No results for input(s): LABPROT, INR in the last 72 hours. ? ? ?Impression / Plan: ?This is a 81 y.o.female who presents for ERCP for stent removal and attempt at final removal of choledocholithiasis. ? ?The risks of [name] ERCP were discussed at length, including but not limited to the risk of perforation, bleeding, abdominal pain, post-ERCP pancreatitis (while usually mild can be severe and even life threatening). ? ?The risks and benefits of endoscopic evaluation/treatment were discussed with the patient and/or family; these include but are not limited to the risk of perforation, infection, bleeding, missed lesions, lack of diagnosis, severe illness requiring  hospitalization, as well as anesthesia and sedation related illnesses.  The patient's history has been reviewed, patient examined, no change in status, and deemed stable for procedure.  The patient and/or fa

## 2022-04-09 NOTE — Discharge Instructions (Signed)
YOU HAD AN ENDOSCOPIC PROCEDURE TODAY: Refer to the procedure report and other information in the discharge instructions given to you for any specific questions about what was found during the examination. If this information does not answer your questions, please call [facility] office at [phone] to clarify.   YOU SHOULD EXPECT: Some feelings of bloating in the abdomen. Passage of more gas than usual. Walking can help get rid of the air that was put into your GI tract during the procedure and reduce the bloating. If you had a lower endoscopy (such as a colonoscopy or flexible sigmoidoscopy) you may notice spotting of blood in your stool or on the toilet paper. Some abdominal soreness may be present for a day or two, also.  DIET: Your first meal following the procedure should be a light meal and then it is ok to progress to your normal diet. A half-sandwich or bowl of soup is an example of a good first meal. Heavy or fried foods are harder to digest and may make you feel nauseous or bloated. Drink plenty of fluids but you should avoid alcoholic beverages for 24 hours. If you had a esophageal dilation, please see attached instructions for diet.    ACTIVITY: Your care partner should take you home directly after the procedure. You should plan to take it easy, moving slowly for the rest of the day. You can resume normal activity the day after the procedure however YOU SHOULD NOT DRIVE, use power tools, machinery or perform tasks that involve climbing or major physical exertion for 24 hours (because of the sedation medicines used during the test).   SYMPTOMS TO REPORT IMMEDIATELY: A gastroenterologist can be reached at any hour. Please call [phone]  for any of the following symptoms:   Following upper endoscopy (EGD, EUS, ERCP, esophageal dilation) Vomiting of blood or coffee ground material  New, significant abdominal pain  New, significant chest pain or pain under the shoulder blades  Painful or  persistently difficult swallowing  New shortness of breath  Black, tarry-looking or red, bloody stools  FOLLOW UP:  If any biopsies were taken you will be contacted by phone or by letter within the next 1-3 weeks. Call [phone]  if you have not heard about the biopsies in 3 weeks.  Please also call with any specific questions about appointments or follow up tests.  

## 2022-04-09 NOTE — Anesthesia Postprocedure Evaluation (Signed)
Anesthesia Post Note ? ?Patient: Judith [name] ? ?Procedure(s) Performed: ENDOSCOPIC RETROGRADE CHOLANGIOPANCREATOGRAPHY (ERCP) WITH PROPOFOL ?BIOPSY ?STENT REMOVAL ?BILIARY DILATION ?REMOVAL OF STONES ? ?  ? ?Patient location during evaluation: PACU ?Anesthesia Type: General ?Level of consciousness: sedated and patient cooperative ?Pain management: pain level controlled ?Vital Signs Assessment: post-procedure vital signs reviewed and stable ?Respiratory status: spontaneous breathing ?Cardiovascular status: stable ?Anesthetic complications: no ?Comments: Pt instructed to take home BP meds when she gets home ? ? ?No notable events documented. ? ?Last Vitals:  ?Vitals:  ? 04/09/22 1215 04/09/22 1220  ?BP:  (!) 192/57  ?Pulse: 76 77  ?Resp: 13 13  ?Temp:    ?SpO2: 94% 94%  ?  ?Last Pain:  ?Vitals:  ? 04/09/22 1137  ?TempSrc: Tympanic  ?PainSc: Asleep  ? ? ?  ?  ?  ?  ?  ?  ? ?[place] ? ? ? ? ?

## 2022-04-10 ENCOUNTER — Encounter (HOSPITAL_COMMUNITY): Payer: Self-pay | Admitting: Gastroenterology

## 2022-04-10 ENCOUNTER — Other Ambulatory Visit: Payer: Self-pay

## 2022-04-10 ENCOUNTER — Encounter: Payer: Self-pay | Admitting: Gastroenterology

## 2022-04-10 DIAGNOSIS — K838 Other specified diseases of biliary tract: Secondary | ICD-10-CM

## 2022-04-10 DIAGNOSIS — R7989 Other specified abnormal findings of blood chemistry: Secondary | ICD-10-CM

## 2022-04-10 LAB — SURGICAL PATHOLOGY

## 2022-04-17 ENCOUNTER — Encounter (INDEPENDENT_AMBULATORY_CARE_PROVIDER_SITE_OTHER): Payer: Medicare Other | Admitting: Ophthalmology

## 2022-04-17 DIAGNOSIS — H43813 Vitreous degeneration, bilateral: Secondary | ICD-10-CM | POA: Diagnosis not present

## 2022-04-17 DIAGNOSIS — E113311 Type 2 diabetes mellitus with moderate nonproliferative diabetic retinopathy with macular edema, right eye: Secondary | ICD-10-CM

## 2022-04-17 DIAGNOSIS — E113392 Type 2 diabetes mellitus with moderate nonproliferative diabetic retinopathy without macular edema, left eye: Secondary | ICD-10-CM | POA: Diagnosis not present

## 2022-04-17 DIAGNOSIS — I10 Essential (primary) hypertension: Secondary | ICD-10-CM

## 2022-04-17 DIAGNOSIS — H35033 Hypertensive retinopathy, bilateral: Secondary | ICD-10-CM

## 2022-05-10 ENCOUNTER — Encounter: Payer: Self-pay | Admitting: *Deleted

## 2022-05-14 ENCOUNTER — Other Ambulatory Visit (INDEPENDENT_AMBULATORY_CARE_PROVIDER_SITE_OTHER): Payer: Medicare Other

## 2022-05-14 DIAGNOSIS — R7989 Other specified abnormal findings of blood chemistry: Secondary | ICD-10-CM | POA: Diagnosis not present

## 2022-05-14 DIAGNOSIS — K838 Other specified diseases of biliary tract: Secondary | ICD-10-CM

## 2022-05-14 LAB — HEPATIC FUNCTION PANEL
ALT: 9 U/L (ref 0–35)
AST: 19 U/L (ref 0–37)
Albumin: 4 g/dL (ref 3.5–5.2)
Alkaline Phosphatase: 70 U/L (ref 39–117)
Bilirubin, Direct: 0.2 mg/dL (ref 0.0–0.3)
Total Bilirubin: 0.6 mg/dL (ref 0.2–1.2)
Total Protein: 6.7 g/dL (ref 6.0–8.3)

## 2022-05-16 ENCOUNTER — Ambulatory Visit (INDEPENDENT_AMBULATORY_CARE_PROVIDER_SITE_OTHER): Payer: Medicare Other | Admitting: Internal Medicine

## 2022-05-16 ENCOUNTER — Encounter: Payer: Self-pay | Admitting: Internal Medicine

## 2022-05-16 VITALS — BP 110/60 | HR 70 | Ht 64.0 in | Wt 124.0 lb

## 2022-05-16 DIAGNOSIS — K3184 Gastroparesis: Secondary | ICD-10-CM

## 2022-05-16 DIAGNOSIS — K219 Gastro-esophageal reflux disease without esophagitis: Secondary | ICD-10-CM

## 2022-05-16 DIAGNOSIS — R7989 Other specified abnormal findings of blood chemistry: Secondary | ICD-10-CM

## 2022-05-16 DIAGNOSIS — K8050 Calculus of bile duct without cholangitis or cholecystitis without obstruction: Secondary | ICD-10-CM

## 2022-05-16 NOTE — Patient Instructions (Signed)
Continue Pantoprazole and Famotidine as directed.   Dr.Pyrtle recommendations that you follow up with your Primary Care Physician every 6 months.   Contact Dr.Smith regarding your back pain.   If you are age 82 or older, your body mass index should be between 23-30. Your Body mass index is 21.28 kg/m. If this is out of the aforementioned range listed, please consider follow up with your Primary Care Provider.  If you are age 52 or younger, your body mass index should be between 19-25. Your Body mass index is 21.28 kg/m. If this is out of the aformentioned range listed, please consider follow up with your Primary Care Provider.   ________________________________________________________  The [facility] providers would like to encourage you to use [facility] to communicate with providers for non-urgent requests or questions.  Due to long hold times on the telephone, sending your provider a message by [facility] may be a faster and more efficient way to get a response.  Please allow 48 business hours for a response.  Please remember that this is for non-urgent requests.  _______________________________________________________  Thank you for choosing me and [facility].

## 2022-05-16 NOTE — Progress Notes (Signed)
Subjective:    Patient ID: Judith [name], female    DOB: Jul 28, 1941, 81 y.o.   MRN: [id number]  HPI Judith [name] is an 81 year old female with a past medical history of GERD, gastroparesis, partial gastrectomy (B-II anatomy) for PUD, gallstones and choledocholithiasis who is here for follow-up.  She is here today with her daughter.  She was hospitalized with choledocholithiasis in December 2022.  She underwent subtotal cholecystectomy and had a temporary drain placement.  She has undergone 2 subsequent ERCPs with Dr. Rush Landmark.  Her last ERCP was on 04/09/2022 by Dr. Rush Landmark.  Due to anatomy this was a difficult procedure.  He was able to access the biliary tree remove the biliary stent, perform sphincteroplasty and biliary stone removal.  Since this time she has been feeling well.  She has had no abdominal pain.  No nausea or vomiting.  No trouble with frequent heartburn.  She does feel hoarse in the morning and wonders if this is related to heartburn.  She tries to eat small more frequent meals due to gastroparesis.  Bowel movements have been regular.  She is no longer needing laxatives.  She lives at [facility]  She does struggle with lower back pain on a regular basis.   Review of Systems As per HPI, otherwise negative.  Current Medications, Allergies, Past Medical History, Past Surgical History, Family History and Social History were reviewed in [employer] record.    Objective:   Physical Exam BP 110/60   Pulse 70   Ht '5\' 4"'$  (1.626 m)   Wt 124 lb (56.2 kg)   BMI 21.28 kg/m  Gen: awake, alert, NAD HEENT: anicteric CV: RRR, no mrg Pulm: CTA b/l Abd: soft, NT/ND, +BS throughout Ext: no c/c/e Neuro: nonfocal  CMP     Component Value Date/Time   NA 133 (L) 12/11/2021 0428   NA 140 02/14/2021 1017   K 3.7 12/11/2021 0428   CL 98 12/11/2021 0428   CO2 26 12/11/2021 0428   GLUCOSE 101 (H) 12/11/2021 0428   BUN 6 (L) 12/11/2021 0428    BUN 15 02/14/2021 1017   CREATININE 0.59 12/11/2021 0428   CALCIUM 8.5 (L) 12/11/2021 0428   PROT 6.7 05/14/2022 0841   PROT 6.7 02/14/2021 1017   ALBUMIN 4.0 05/14/2022 0841   ALBUMIN 4.2 02/14/2021 1017   AST 19 05/14/2022 0841   ALT 9 05/14/2022 0841   ALKPHOS 70 05/14/2022 0841   BILITOT 0.6 05/14/2022 0841   BILITOT 0.5 02/14/2021 1017   GFRNONAA >60 12/11/2021 0428   GFRAA 61 02/14/2021 1017        Assessment & Plan:  81 year old female with a past medical history of GERD, gastroparesis, partial gastrectomy (B-II anatomy) for PUD, gallstones and choledocholithiasis who is here for follow-up.   Cholelithiasis/choledocholithiasis --status post partial cholecystectomy and ERCP with stone removal.  Due to biliary stasis recurrent stones are possible but for now stones are cleared from her duct and her liver enzymes are normalized.  We briefly discussed ursodiol for possible prevention of gallstones but I am not convinced that this provides any additional benefit.  Therefore I am not recommending it at this time -- I suggest hepatic function panel or complete metabolic panel every 6 months; she should return to GI clinic in the event of recurrent liver enzyme elevation  2.  GERD/hx PUD --status post prior antrectomy.  Continue pantoprazole 40 mg in the morning and famotidine 20 mg in the evening  3.  Gastroparesis --small more frequent meals and a gastroparesis diet  4.  Constipation --no longer an issue.  She is not taking MiraLAX or Colace.  We can discontinue MiraLAX and she can use Colace 100 mg nightly as needed  30 minutes total spent today including patient facing time, coordination of care, reviewing medical history/procedures/pertinent radiology studies, and documentation of the encounter.

## 2022-07-17 ENCOUNTER — Encounter (INDEPENDENT_AMBULATORY_CARE_PROVIDER_SITE_OTHER): Payer: Medicare Other | Admitting: Ophthalmology

## 2022-07-17 DIAGNOSIS — H43813 Vitreous degeneration, bilateral: Secondary | ICD-10-CM | POA: Diagnosis not present

## 2022-07-17 DIAGNOSIS — H35033 Hypertensive retinopathy, bilateral: Secondary | ICD-10-CM | POA: Diagnosis not present

## 2022-07-17 DIAGNOSIS — I10 Essential (primary) hypertension: Secondary | ICD-10-CM | POA: Diagnosis not present

## 2022-07-17 DIAGNOSIS — E113393 Type 2 diabetes mellitus with moderate nonproliferative diabetic retinopathy without macular edema, bilateral: Secondary | ICD-10-CM | POA: Diagnosis not present

## 2022-07-23 ENCOUNTER — Ambulatory Visit: Payer: Medicare Other | Admitting: Student

## 2022-08-07 ENCOUNTER — Encounter: Payer: Self-pay | Admitting: Student

## 2022-08-07 ENCOUNTER — Ambulatory Visit (INDEPENDENT_AMBULATORY_CARE_PROVIDER_SITE_OTHER): Payer: Medicare Other | Admitting: Student

## 2022-08-07 VITALS — BP 137/57 | HR 63 | Ht 64.0 in | Wt 125.1 lb

## 2022-08-07 DIAGNOSIS — Z7689 Persons encountering health services in other specified circumstances: Secondary | ICD-10-CM | POA: Insufficient documentation

## 2022-08-07 HISTORY — DX: Persons encountering health services in other specified circumstances: Z76.89

## 2022-08-07 MED ORDER — LIDOCAINE 4 % EX PTCH: 1.0000 | MEDICATED_PATCH | Freq: Every day | CUTANEOUS | 0 refills | Status: DC | PRN

## 2022-08-07 NOTE — Progress Notes (Signed)
    SUBJECTIVE:   CHIEF COMPLAINT / HPI:   [name] presents to establish care today as a new patient.  She is here today with her daughter Judith [name], who is her healthcare power of attorney.  Judith [name] is a resident at [facility] assisted living facility on the [place] side.  Judith [name] is very independent, able to perform all of her ADLs.  Her ALF does cook for her.  Main concern today is diabetes and running high with glucose. She does have a CGM and uses LAI and SAI.  CBGs run in the 300s-400s.  Pacemaker in 2007, has complete AV heart block with 100% ventricular pacing. Follows with cardiology. Will need battery replaced soon.  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  No lower extremity edema.  Distal pulses palpable. Resp: Normal work of breathing on room air, no wheezing or crackles Abdomen: Soft, nontender nondistended.  Well-healed vertical midline scar   Skin: Warm, dry, well-perfused, no rashes/bruises or lesions Neuro:   ASSESSMENT/PLAN:   Encounter to establish care with new doctor No urgent issues today Plan for A1c check at next visit and titration of insulin regimen     Judith [name], [place]

## 2022-08-07 NOTE — Patient Instructions (Signed)
It was great to meet you! Thank you for allowing me to participate in your care!  I recommend that you always bring your medications to each appointment as this makes it easy to ensure we are on the correct medications and helps [place] not miss when refills are needed.  Our plans for today:  - I placed a referral to gynecology for your urinary incontinence - Your lidocaine patch may be worn as needed - We will see you back in 1-2 weeks for follow-up to discuss your diabetes regimen further   Dr. Orvis Brill, [place]

## 2022-08-07 NOTE — Assessment & Plan Note (Signed)
No urgent issues today Plan for A1c check at next visit and titration of insulin regimen

## 2022-08-21 ENCOUNTER — Ambulatory Visit (INDEPENDENT_AMBULATORY_CARE_PROVIDER_SITE_OTHER): Payer: Medicare Other | Admitting: Student

## 2022-08-21 ENCOUNTER — Encounter: Payer: Self-pay | Admitting: Student

## 2022-08-21 VITALS — BP 137/64 | HR 64 | Ht 64.0 in | Wt 125.2 lb

## 2022-08-21 DIAGNOSIS — Z794 Long term (current) use of insulin: Secondary | ICD-10-CM | POA: Diagnosis not present

## 2022-08-21 DIAGNOSIS — I10 Essential (primary) hypertension: Secondary | ICD-10-CM

## 2022-08-21 DIAGNOSIS — N3946 Mixed incontinence: Secondary | ICD-10-CM | POA: Diagnosis not present

## 2022-08-21 DIAGNOSIS — E1140 Type 2 diabetes mellitus with diabetic neuropathy, unspecified: Secondary | ICD-10-CM

## 2022-08-21 LAB — POCT GLYCOSYLATED HEMOGLOBIN (HGB A1C): HbA1c, POC (controlled diabetic range): 7.8 % — AB (ref 0.0–7.0)

## 2022-08-21 MED ORDER — SOLIFENACIN SUCCINATE 10 MG PO TABS: 10.0000 mg | ORAL_TABLET | Freq: Every day | ORAL | 3 refills | Status: DC

## 2022-08-21 NOTE — Progress Notes (Unsigned)
    SUBJECTIVE:   CHIEF COMPLAINT / HPI:   T2DM Current regimen: Lantus 25u daily at bedtime, Humalog 0-20u TID PRN SSI Fasting CBG 83, reports elevation in 300s Says her glucose is very labile  Hypertension Losartan 100 mg daily, Coreg 25 mg BID  Urinary continence Reports she will stand up and lose urinary control Also sometimes does not make it to the restroom in time to relieve bladder Will go see dentist that has medicaid- going on Thursday, has rotten tooth on lower teeth. No fevers, chills.   [facility] in [place]  PMH / [place]: Reviewed  OBJECTIVE:   There were no vitals taken for this visit. ***   ASSESSMENT/PLAN:   No problem-specific Assessment & Plan notes found for this encounter.     Orvis Brill, [place]    {    This will disappear when note is signed, click to select method of visit    :1}

## 2022-08-21 NOTE — Patient Instructions (Addendum)
It was great seeing you today.  I sent in a prescription called Vesicare to help with your urinary incontinence. Let me know if this does not help.  You have an appointment at 10:30 am on September 7th with Dr. Valentina Lucks (our clinical pharmacist) here at the [facility] center to help with blood sugar management.   If you have any questions or concerns, please feel free to call the clinic.    Be well,  Dr. Orvis Brill [facility] [phone]

## 2022-08-22 DIAGNOSIS — R32 Unspecified urinary incontinence: Secondary | ICD-10-CM | POA: Insufficient documentation

## 2022-08-22 NOTE — Assessment & Plan Note (Signed)
Well-controlled Continue current regimen (Losartan, Carvedilol)

## 2022-08-22 NOTE — Assessment & Plan Note (Signed)
No concern for UTI Symptoms of incontinence most consistent with mixed urge/stress picture Will start with Vesicare daily and see if she has improvement Discussed time voiding as well

## 2022-08-22 NOTE — Assessment & Plan Note (Signed)
A1c 7.8% today, at goal. Given labile nature of glucose, will set up appointment with Dr. Valentina Lucks for further management which patient agreed to. Asked her to bring in a glucose log to visit.

## 2022-08-30 ENCOUNTER — Encounter: Payer: Self-pay | Admitting: Pharmacist

## 2022-08-30 ENCOUNTER — Ambulatory Visit (INDEPENDENT_AMBULATORY_CARE_PROVIDER_SITE_OTHER): Payer: Medicare Other | Admitting: Pharmacist

## 2022-08-30 DIAGNOSIS — Z794 Long term (current) use of insulin: Secondary | ICD-10-CM | POA: Diagnosis not present

## 2022-08-30 DIAGNOSIS — E1140 Type 2 diabetes mellitus with diabetic neuropathy, unspecified: Secondary | ICD-10-CM

## 2022-08-30 DIAGNOSIS — I10 Essential (primary) hypertension: Secondary | ICD-10-CM

## 2022-08-30 MED ORDER — LOSARTAN POTASSIUM 50 MG PO TABS: 50.0000 mg | ORAL_TABLET | Freq: Every day | ORAL | 3 refills | Status: DC

## 2022-08-30 MED ORDER — LANTUS SOLOSTAR 100 UNIT/ML ~~LOC~~ SOPN: 20.0000 [IU] | PEN_INJECTOR | Freq: Every day | SUBCUTANEOUS | 11 refills | Status: DC

## 2022-08-30 MED ORDER — HUMALOG KWIKPEN 200 UNIT/ML ~~LOC~~ SOPN: 6.0000 [IU] | PEN_INJECTOR | Freq: Three times a day (TID) | SUBCUTANEOUS | 11 refills | Status: DC

## 2022-08-30 NOTE — Progress Notes (Signed)
Reviewed: I agree with Dr. Koval's documentation and management. 

## 2022-08-30 NOTE — Progress Notes (Signed)
S:     Chief Complaint  Patient presents with   Medication Management    diabetes   Judith [name] is a 81 y.o. female who presents for diabetes evaluation, education, and management.  PMH is significant for hypertension, diabetes, gastroparesis.  Patient was referred and last seen by Primary Care Provider, Dr. Owens Shark, on 08/21/2022.   Today, patient arrives in good spirits and presents without any assistance.  Patient reports Diabetes was diagnosed in 81s.   Current diabetes medications include: Humalog (insulin lispro) with meals 6-12 units, Lantus (insulin glargine) 25 units daily Current hypertension medications include: carvedilol 25 mg BID, losartan 100 mg daily. Current hyperlipidemia medications include: rosuvastatin 10 mg daily  Patient reports adherence to taking all medications as prescribed.   Patient reports symptomatic hypoglycemic events about once a month overnight.  Patient reports neuropathy (nerve pain) - fair control with gabapentin. Patient reports visual changes.  O:   Review of Systems  Eyes:  Positive for blurred vision.  Neurological:  Positive for tingling.  All other systems reviewed and are negative.   Physical Exam Constitutional:      Appearance: Normal appearance. She is normal weight.  Neurological:     Mental Status: She is alert.  Psychiatric:        Mood and Affect: Mood normal.        Behavior: Behavior normal.    LibreView CGM Download:  % Time CGM is active: 59% Average Glucose: 191 mg/dL Glucose Variability: 31.7 (goal <36%) Time in Goal:  - Time in range 70-180: 55% - Time above range: 45% - Time below range: 0% Observed patterns: missing data overnight  Lab Results  Component Value Date   HGBA1C 7.8 (A) 08/21/2022   Vitals:   08/30/22 1121  BP: (!) 122/51  Pulse: 60  SpO2: 98%    Lipid Panel     Component Value Date/Time   CHOL 140 02/14/2021 1017   TRIG 95 02/14/2021 1017   HDL 56 02/14/2021 1017    CHOLHDL 2.5 02/14/2021 1017   LDLCALC 66 02/14/2021 1017    A/P: Diabetes longstanding, currently uncontrolled but with minimal low readings. Patient is able to verbalize appropriate hypoglycemia management plan. Medication adherence appears good. Control is suboptimal due to elevated blood glucose on CGM.  No evidence of any hypoglycemia for the previous 2 weeks.  However, patient reports of symptomatic hypoglycemia overnight approximately once a month. -Decreased basal insulin Lantus (insulin glargine) from 25 to 20 units daily to decrease risk of hypoglycemia.  -Adjusted dose of rapid insulin Humalog (insulin lispro) by adding insulin if blood glucose 100-150 before meals to help with elevated blood glucose.  100-150 6 units 151-200 8 units 201-250 10 units 251+ 12 units -Extensively discussed pathophysiology of diabetes, recommended lifestyle interventions, dietary effects on blood sugar control.  -Counseled on s/sx of and management of hypoglycemia.  -Counseled patient on importance of scanning Freestyle Libre and encouraged patient to scan before going to bed to obtain overnight CGM data.   Blood pressure goal of <130/80 mmHg. Medication adherence good. Reports dizziness upon standing.  - Decreased losartan from 100 to 50 mg daily due to diastolic BP of 51 and patient reports of orthostatic hypotension. Reevaluate control at next follow-up.  Provided instructions to either take 1/2 of losartan '100mg'$  or to switch to '50mg'$  daily - new prescription provided.   Written patient instructions provided. Patient verbalized understanding of treatment plan.  Total time in face to face counseling  70 minutes.    Follow-up:  Pharmacist with Dr. Valentina Lucks 09/27/2022. PCP clinic visit prn.  Patient seen with Martina Sinner, PharmD Candidate, Jeneen Rinks,  PharmD PGY-1 Resident, and Joseph Art, PharmD, PGY2 Pharmacy Resident.

## 2022-08-30 NOTE — Patient Instructions (Addendum)
It was nice to see you today!  Your goal blood sugar is 80-130 before eating and less than 180 after eating.  Medication Changes: Decrease dose of basal insulin Lantus to 20 units daily (insulin glargine).   Adjust dose of rapid insulin humalog (insulin lispro) 100-150 6 units 151-200 8 units 201-250 10 units >250 12 units  Monitor blood sugars at home and keep a log (glucometer or piece of paper) to bring with you to your next visit.  Decrease losartan to 50 mg daily. Please cut 100 mg tablets in half and will send a new prescription.  Keep up the good work with diet and exercise. Aim for a diet full of vegetables, fruit and lean meats (chicken, [place], fish). Try to limit salt intake by eating fresh or frozen vegetables (instead of canned), rinse canned vegetables prior to cooking and do not add any additional salt to meals.

## 2022-08-30 NOTE — Assessment & Plan Note (Signed)
Diabetes longstanding, currently uncontrolled but with minimal low readings. Patient is able to verbalize appropriate hypoglycemia management plan. Medication adherence appears good. Control is suboptimal due to elevated blood glucose on CGM.  No evidence of any hypoglycemia for the previous 2 weeks.  However, patient reports of symptomatic hypoglycemia overnight approximately once a month. -Decreased basal insulin Lantus (insulin glargine) from 25 to 20 units daily to decrease risk of hypoglycemia.  -Adjusted dose of rapid insulin Humalog (insulin lispro) by adding insulin if blood glucose 100-150 before meals to help with elevated blood glucose.  100-150 6 units 151-200 8 units 201-250 10 units 251+ 12 units -Extensively discussed pathophysiology of diabetes, recommended lifestyle interventions, dietary effects on blood sugar control.  -Counseled on s/sx of and management of hypoglycemia.  -Counseled patient on importance of scanning Freestyle Libre and encouraged patient to scan before going to bed to obtain overnight CGM data.

## 2022-09-03 ENCOUNTER — Telehealth: Payer: Self-pay

## 2022-09-03 NOTE — Telephone Encounter (Signed)
Received phone call from Kathline Magic regarding patient's elevated blood sugar and insulin questions. Attempted to call back on number provided in VM, unable to leave message.   Received phone call from patient's daughter regarding concern. She provided me with another number to reach patient's nurse. Called [phone]. Spoke with Deseree, patient's nurse. Patient missed afternoon dose of sliding scale insulin.   Patient's current CBG is 400. Sliding scale of 12 units administered. Spoke with Dr. Valentina Lucks regarding patient. He advises that patient receive nightly Lantus. No changes in medication at this time. He would like facility to call tomorrow if blood sugar levels continue to run greater than 250.   Returned call to patient's daughter, [name], with update. She was appreciative.   Talbot Grumbling, RN

## 2022-09-27 ENCOUNTER — Encounter: Payer: Self-pay | Admitting: Pharmacist

## 2022-09-27 ENCOUNTER — Ambulatory Visit (HOSPITAL_BASED_OUTPATIENT_CLINIC_OR_DEPARTMENT_OTHER): Payer: Medicare Other | Admitting: Pharmacist

## 2022-09-27 VITALS — BP 137/54 | HR 70 | Ht 62.0 in

## 2022-09-27 DIAGNOSIS — E1165 Type 2 diabetes mellitus with hyperglycemia: Secondary | ICD-10-CM | POA: Diagnosis not present

## 2022-09-27 DIAGNOSIS — E1140 Type 2 diabetes mellitus with diabetic neuropathy, unspecified: Secondary | ICD-10-CM

## 2022-09-27 DIAGNOSIS — Z794 Long term (current) use of insulin: Secondary | ICD-10-CM | POA: Diagnosis not present

## 2022-09-27 MED ORDER — HUMALOG KWIKPEN 200 UNIT/ML ~~LOC~~ SOPN: 6.0000 [IU] | PEN_INJECTOR | Freq: Three times a day (TID) | SUBCUTANEOUS | 11 refills | Status: DC

## 2022-09-27 MED ORDER — LANTUS SOLOSTAR 100 UNIT/ML ~~LOC~~ SOPN: 16.0000 [IU] | PEN_INJECTOR | Freq: Every day | SUBCUTANEOUS | 11 refills | Status: DC

## 2022-09-27 NOTE — Progress Notes (Signed)
Reviewed: I agree with Dr. Koval's documentation and management. 

## 2022-09-27 NOTE — Patient Instructions (Addendum)
It was nice to see you today!  Medication Changes: Decrease lantus to 16 units every morning. Adjust sliding scale as discussed: Inject 6-10 Units into the skin 3 (three) times daily before meals. 80-150 6 units, 151-200 7 units, 201-250 8 units, 251-300 9 units. If >300 give 10 units, recheck at next meal, and call MD if multiple readings > 300 in a day  Monitor blood sugars at home and keep a log (glucometer or piece of paper) to bring with you to your next visit.  Keep up the good work with diet and exercise. Aim for a diet full of vegetables, fruit and lean meats (chicken, [place], fish). Try to limit salt intake by eating fresh or frozen vegetables (instead of canned), rinse canned vegetables prior to cooking and do not add any additional salt to meals.

## 2022-09-27 NOTE — Assessment & Plan Note (Signed)
Diabetes longstanding currently at goal (most recent A1C 7.8, goal <8 given age and fragility). Patient is able to verbalize appropriate hypoglycemia management plan. Medication adherence appears appropriate. -Decreased dose of basal insulin Lantus (insulin glargine) from 20 units to 16 units daily to further reduce episodes of nocturnal hypoglycemia goal is 1 or fewer episodes of registered blood glucose < 80 overnight per month.  -Decreased dose of rapid insulin Humalog (insulin lispro)  FROM  6-12 units to 6-10 Units 3 times daily before meals  TO Inject 6-10 Units into the skin 3 (three) times daily before meals. (IF blood glucose is:  < 80 - treat low blood sugar including eating meal, recheck blood sugar and continue with sliding scale,  80-150=6 units,  151-200=7 units,  201-250=8 units,  251-300=9 units.  If >300 give 10 units, recheck at next meal, and call MD if multiple readings > 300 in a day -Patient educated on purpose, proper use, and potential adverse effects of hypoglycemia.  -Extensively discussed pathophysiology of diabetes, recommended lifestyle interventions, dietary effects on blood sugar control.  -Counseled on s/sx of and management of hypoglycemia.  -Next A1c anticipated November 2023.

## 2022-09-27 NOTE — Progress Notes (Signed)
S:     Chief Complaint  Patient presents with   Medication Management    Diabetes follow-up   81 y.o. female who presents for diabetes evaluation, education, and management.  PMH is significant for hypertension, diabetes, gastroparesis.  Patient was last seen by Primary Care Provider, Dr. Owens Shark, on 08/21/2022 and last seen in Rx clinic on 08/30/2022. At last visit, Lantus (insulin glargine) was decreased from 25 units to 20 units and mealtime Humalog (insulin lispro) was adjusted to add correction of 6-12 units.   Today, patient arrives in good spirits and presents without any assistance. Patient is accompanied by daughter, Judith [name].  Patient reports Diabetes was diagnosed in 25s.   Current diabetes medications include: Humalog (insulin lispro) with meals 6-12 units, Lantus (insulin glargine) 20 units daily Current hypertension medications include: carvedilol 25 mg BID, losartan 50 mg daily. Current hyperlipidemia medications include: rosuvastatin 10 mg daily  Patient reports adherence to taking all medications as prescribed.  Insurance coverage: Medicare  Patient reports hypoglycemic events.  Reported home fasting blood sugars: see CGM data below   Patient-reported exercise habits: walks occasionally, limited by recent COVID diagnosis.   O:   Review of Systems  All other systems reviewed and are negative.   Physical Exam Constitutional:      Appearance: Normal appearance. She is normal weight.  Pulmonary:     Effort: Pulmonary effort is normal.  Neurological:     Mental Status: She is alert.  Psychiatric:        Mood and Affect: Mood normal.        Behavior: Behavior normal.        Thought Content: Thought content normal.        Judgment: Judgment normal.     CGM Download:  Average Glucose: 141 mg/dL Glucose Management Indicator: 6.7  Glucose Variability: 44.2 (goal <36%) Time in Goal:  - Time in range 70-180: 62% - Time above range: 25% - Time below  range: 13% Observed patterns: Nocturnal hypoglycemia around 3-9 am  Lab Results  Component Value Date   HGBA1C 7.8 (A) 08/21/2022   Vitals:   09/27/22 1034 09/27/22 1040  BP: 124/78 (!) 137/54  Pulse:  70  SpO2:  100%    Lipid Panel     Component Value Date/Time   CHOL 140 02/14/2021 1017   TRIG 95 02/14/2021 1017   HDL 56 02/14/2021 1017   CHOLHDL 2.5 02/14/2021 1017   LDLCALC 66 02/14/2021 1017    A/P: Diabetes longstanding currently at goal (most recent A1C 7.8, goal <8 given age and fragility). Patient is able to verbalize appropriate hypoglycemia management plan. Medication adherence appears appropriate. -Decreased dose of basal insulin Lantus (insulin glargine) from 20 units to 16 units daily to further reduce episodes of nocturnal hypoglycemia goal is 1 or fewer episodes of registered blood glucose < 80 overnight per month.  -Decreased dose of rapid insulin Humalog (insulin lispro)  FROM  6-12 units to 6-10 Units 3 times daily before meals  TO Inject 6-10 Units into the skin 3 (three) times daily before meals. (IF blood glucose is:  < 80 - treat low blood sugar including eating meal, recheck blood sugar and continue with sliding scale,  80-150=6 units,  151-200=7 units,  201-250=8 units,  251-300=9 units.  If >300 give 10 units, recheck at next meal, and call MD if multiple readings > 300 in a day -Patient educated on purpose, proper use, and potential adverse effects of hypoglycemia.  -Extensively  discussed pathophysiology of diabetes, recommended lifestyle interventions, dietary effects on blood sugar control.  -Counseled on s/sx of and management of hypoglycemia.  -Next A1c anticipated November 2023.   Written patient instructions provided. Patient verbalized understanding of treatment plan.  Total time in face to face counseling 40 minutes.    Follow-up:  Pharmacist PRN. PCP clinic visit in 1 month.   Return to pharmacy clinic in 1-2 months per PCP  request. Patient seen with Geraldo Docker, PharmD Candidate, Garrel Ridgel PharmD Candidate.and Joseph Art, PharmD, PGY2 Pharmacy Resident.  Marland Kitchen

## 2022-09-28 ENCOUNTER — Telehealth: Payer: Self-pay

## 2022-09-28 ENCOUNTER — Encounter: Payer: Self-pay | Admitting: Cardiovascular Disease

## 2022-09-28 NOTE — Telephone Encounter (Signed)
Yes, I can sign.

## 2022-09-28 NOTE — Telephone Encounter (Signed)
Patients daughter calls nurse line to discuss insulin regime.   Patients daughter has Programme researcher, broadcasting/film/video on the call as well. Daughter reports confusion at [facility] with patients insulin.   [facility] nurse reports they need a specific time on giving Lantus. She reports concerns of giving Lantus and (1st meal) Humalog "too close together." She reports the patient wakes up everyday at 9 and immediately goes to breakfast.   Spoke with Valentina Lucks. He reports it is ok to give Lantus and (1st meal) Humalog at the same time.   [facility] is aware of UTD sliding scale changes made at [facility] visit.   They were appreciative of call.

## 2022-10-23 ENCOUNTER — Encounter (INDEPENDENT_AMBULATORY_CARE_PROVIDER_SITE_OTHER): Payer: Medicare Other | Admitting: Ophthalmology

## 2022-10-23 DIAGNOSIS — H43813 Vitreous degeneration, bilateral: Secondary | ICD-10-CM

## 2022-10-23 DIAGNOSIS — H35033 Hypertensive retinopathy, bilateral: Secondary | ICD-10-CM

## 2022-10-23 DIAGNOSIS — E113393 Type 2 diabetes mellitus with moderate nonproliferative diabetic retinopathy without macular edema, bilateral: Secondary | ICD-10-CM | POA: Diagnosis not present

## 2022-10-23 DIAGNOSIS — I10 Essential (primary) hypertension: Secondary | ICD-10-CM

## 2022-11-01 ENCOUNTER — Ambulatory Visit (INDEPENDENT_AMBULATORY_CARE_PROVIDER_SITE_OTHER): Payer: Medicare Other | Admitting: Student

## 2022-11-01 ENCOUNTER — Encounter: Payer: Self-pay | Admitting: Student

## 2022-11-01 VITALS — BP 128/80 | HR 88 | Wt 115.8 lb

## 2022-11-01 DIAGNOSIS — R634 Abnormal weight loss: Secondary | ICD-10-CM

## 2022-11-01 DIAGNOSIS — E1165 Type 2 diabetes mellitus with hyperglycemia: Secondary | ICD-10-CM | POA: Diagnosis present

## 2022-11-01 DIAGNOSIS — R0982 Postnasal drip: Secondary | ICD-10-CM

## 2022-11-01 DIAGNOSIS — Z794 Long term (current) use of insulin: Secondary | ICD-10-CM | POA: Diagnosis not present

## 2022-11-01 DIAGNOSIS — E1140 Type 2 diabetes mellitus with diabetic neuropathy, unspecified: Secondary | ICD-10-CM | POA: Diagnosis not present

## 2022-11-01 HISTORY — DX: Postnasal drip: R09.82

## 2022-11-01 LAB — POCT GLYCOSYLATED HEMOGLOBIN (HGB A1C): HbA1c, POC (controlled diabetic range): 7.5 % — AB (ref 0.0–7.0)

## 2022-11-01 MED ORDER — FLUTICASONE PROPIONATE 50 MCG/ACT NA SUSP: 2.0000 | Freq: Every day | NASAL | 6 refills | Status: DC

## 2022-11-01 NOTE — Progress Notes (Signed)
    SUBJECTIVE:   CHIEF COMPLAINT / HPI:   Judith [name] is an 81 year old female here for follow-up of diabetes.  Also of concern today, still having a sore throat since she had COVID about a month ago.  She says she has lost 10 pounds unintentionally as well. No change in bowel habits.  No blood in stool.  No abdominal pain.  No fevers, night sweats She says she is just "not been hungry" since she had COVID.  T2DM follow up On Insulin glargine 16u daily to reduce nocturnal hypoglycemia (goal is 1 or fewer episodes of CBG <80 per month) Has not had any nighttime lows since the decrease in long-acting insulin. Fasting CBG is 170  Has been on insulin for >20 years. Taking Insulin lispro 6-10 units TID before meals  Current hypertension medications include: carvedilol 25 mg BID, losartan 50 mg daily. Current hyperlipidemia medications include: rosuvastatin 10 mg daily  PERTINENT  PMH / PSH: Reviewed  OBJECTIVE:   BP 128/80   Pulse 88   Wt 115 lb 12.8 oz (52.5 kg)   SpO2 94%   BMI 21.18 kg/m   General: Thin, elderly female in no distress, very pleasant HEENT: Significant postnasal drip with pharyngeal erythema.  No palpable cervical lymphadenopathy CV: Regular rate and rhythm Respiratory: Normal work of breathing on room air Abdomen: Soft, nontender, nondistended.  No palpable masses. Extremities: Warm, well-perfused ASSESSMENT/PLAN:   Controlled type 2 diabetes mellitus with diabetic neuropathy, with long-term current use of insulin (HCC) Reviewed CGM readings,In target 48% of the time. Average CBG 191.  Continue current regimen, no changes today.  Unintentional weight loss Over the course of 2 months, has lost 10 pounds unintentionally.  Weight today 115 pounds, on 08/30/2022 weight 125 pounds. No red flag signs or symptoms on exam or history today. Consider depression, thyroid abnormality, malignancy although less likely.  Also consider post-COVID syndrome, given that this happened  around the time when she had COVID and in the course thereafter. We will have very close follow-up in 1 month to recheck weight.  Encouraged to have an extra snack and/or Ensure shake daily.   Postnasal drip Significant nasal drainage with pharyngeal erythema on exam today. No concern for bacterial sinusitis given lack of facial pain, purulent nasal discharge or fever. If she develops any of the above symptoms, can treat with an antibiotic. For now, encouraged to use Flonase daily as well as saline nasal spray.      Orvis Brill, [place]

## 2022-11-01 NOTE — Assessment & Plan Note (Signed)
Over the course of 2 months, has lost 10 pounds unintentionally.  Weight today 115 pounds, on 08/30/2022 weight 125 pounds. No red flag signs or symptoms on exam or history today. Consider depression, thyroid abnormality, malignancy although less likely.  Also consider post-COVID syndrome, given that this happened around the time when she had COVID and in the course thereafter. We will have very close follow-up in 1 month to recheck weight.  Encouraged to have an extra snack and/or Ensure shake daily.

## 2022-11-01 NOTE — Assessment & Plan Note (Signed)
Reviewed CGM readings,In target 48% of the time. Average CBG 191.  Continue current regimen, no changes today.

## 2022-11-01 NOTE — Assessment & Plan Note (Signed)
Significant nasal drainage with pharyngeal erythema on exam today. No concern for bacterial sinusitis given lack of facial pain, purulent nasal discharge or fever. If she develops any of the above symptoms, can treat with an antibiotic. For now, encouraged to use Flonase daily as well as saline nasal spray.

## 2022-11-01 NOTE — Patient Instructions (Addendum)
It was great seeing you today.  I sent in Flonase to your pharmacy. Use 2 sprays in each nostril. If you develop a fever, or have face pain with worsening drainage from your nose please call me and I will send in an antibiotic.  Keep taking your insulin as you are.  I will see you back in 1 month to check on your weight. Start eating an extra snack and protein/ensure drink.  If you have any questions or concerns, please feel free to call the clinic.    Be well,  Dr. Orvis Brill [facility] [phone]

## 2022-11-02 ENCOUNTER — Telehealth: Payer: Self-pay

## 2022-11-02 NOTE — Telephone Encounter (Signed)
Patients daughter calls nurse line in regards to Ensure.   She reports she feels Glucerna would be a better choice given her diabetes.   I spoke with PCP and she is fine with any supplemental drink.   I called daughter back and informed and also advised to purchase OTC.

## 2022-11-05 ENCOUNTER — Ambulatory Visit: Payer: Medicare Other | Attending: Cardiovascular Disease | Admitting: Cardiovascular Disease

## 2022-11-05 ENCOUNTER — Encounter: Payer: Self-pay | Admitting: Cardiovascular Disease

## 2022-11-05 VITALS — BP 138/75 | HR 72 | Ht 63.5 in | Wt 117.6 lb

## 2022-11-05 DIAGNOSIS — E1140 Type 2 diabetes mellitus with diabetic neuropathy, unspecified: Secondary | ICD-10-CM | POA: Insufficient documentation

## 2022-11-05 DIAGNOSIS — I442 Atrioventricular block, complete: Secondary | ICD-10-CM | POA: Diagnosis not present

## 2022-11-05 DIAGNOSIS — Z794 Long term (current) use of insulin: Secondary | ICD-10-CM | POA: Insufficient documentation

## 2022-11-05 DIAGNOSIS — Z950 Presence of cardiac pacemaker: Secondary | ICD-10-CM | POA: Insufficient documentation

## 2022-11-05 DIAGNOSIS — I10 Essential (primary) hypertension: Secondary | ICD-10-CM | POA: Insufficient documentation

## 2022-11-05 NOTE — Patient Instructions (Signed)
Medication Instructions:  Your physician recommends that you continue on your current medications as directed. Please refer to the Current Medication list given to you today.  *If you need a refill on your cardiac medications before your next appointment, please call your pharmacy*   Lab Work: None ordered.  If you have labs (blood work) drawn today and your tests are completely normal, you will receive your results only by: [place] (if you have MyChart) OR A paper copy in the mail If you have any lab test that is abnormal or we need to change your treatment, we will call you to review the results.   Testing/Procedures: None ordered.    Follow-Up: At [facility], you and your health needs are our priority.  As part of our continuing mission to provide you with exceptional heart care, we have created designated Provider Care Teams.  These Care Teams include your primary Cardiologist (physician) and Advanced Practice Providers (APPs -  Physician Assistants and Nurse Practitioners) who all work together to provide you with the care you need, when you need it.  We recommend signing up for the patient portal called "MyChart".  Sign up information is provided on this After Visit Summary.  MyChart is used to connect with patients for Virtual Visits (Telemedicine).  Patients are able to view lab/test results, encounter notes, upcoming appointments, etc.  Non-urgent messages can be sent to your provider as well.   To learn more about what you can do with MyChart, go to [email or web address].    Your next appointment:   3 month(s)  The format for your next appointment:   In Person  Provider:   Sanda Klein, MD   Important Information About Sugar

## 2022-11-05 NOTE — Progress Notes (Signed)
Cardiology office note:    Date:  11/05/2022   ID:  Judith [name], DOB 02/24/1941, MRN [id number]  PCP:  Orvis Brill, DO  Cardiologist:  Sanda Klein, MD    Referring MD: Orvis Brill, DO   Chief complaint: pacemaker check  History of Present Illness:    Judith [name] is a 81 y.o. female with a hx of pacemaker implantation in [place].  She has complete heart block and is pacemaker dependent.  Her device is a Engineer, production S device that is not capable of remote downloads.  She was hospitalized 11/28/2021 with coledocholithiasis, had ERCP/biliary stent 11/30/2021 and lap cholecystectomy 12/06/2021, scheduled for another ERCP 02/01/2022 for stent removal.  She has not had any serious health issues since the resolution of her biliary problems.  She has no cardiovascular complaints. The patient specifically denies any chest pain at rest exertion, dyspnea at rest or with exertion, orthopnea, paroxysmal nocturnal dyspnea, syncope, palpitations, focal neurological deficits, intermittent claudication, lower extremity edema, unexplained weight gain, cough, hemoptysis or wheezing.   The pacemaker has approximately 1 year of remaining battery longevity (implanted 2011).  She has only 9% atrial pacing and has normal heart rate histogram distribution.  She has 100% ventricular pacing and there is no ventricular escape rhythm.  She has not had any episodes of atrial mode switch or high ventricular rates.  Her initial pacemaker was implanted in 2007.  It appears that her ventricular lead is still the original one (Guidant [id number] implanted July 05, 2006).  June 09, 2010 she underwent a pacemaker generator change out and placement of a new atrial lead (Guidant [id number]).   She does not have a history of coronary disease.  She reports a normal stress test in the past.  She had an echocardiogram just a few months before coming to [place] and was told that her heart pumping strength is  normal.  She was told that she has a murmur which is due to a mildly leaky valve.  There is no reported history of heart failure that I am aware of.   She does not have intermittent claudication and reports that lower extremity ABIs have been performed in the past with normal results.  She has never had a stroke or TIA.  She was taking lovastatin for lipid lowering but has been off this medication for a while.  She has lost a lot of weight since then.  Past Medical History:  Diagnosis Date   Anemia    Arthritis    Biliary gastritis    Blood transfusion without reported diagnosis    2007   Cervical spine arthritis 04/05/2016   Choledocholithiasis    Controlled type 2 diabetes mellitus with diabetic neuropathy, with long-term current use of insulin ([facility])    Depression    Diabetes mellitus type 2 with complications ([place])    Dilated bile duct    Duodenal ulcer disease    Encounter for long-term (current) use of insulin ([place])    Gallstones 01/17/2018   Gastric ulcer    Gastritis and gastroduodenitis    Gastroparesis    severe   GERD (gastroesophageal reflux disease)    Gout    tested for gout but was not   Heart murmur    Hiatal hernia    Hyperlipidemia    in past not now   Hypertension    Hypokalemia, gastrointestinal losses    secondary to severe gastroparesis   Internal hemorrhoids    Long-term insulin  use in type 2 diabetes ([place]) 04/05/2016   Osteoporosis    just had bone density test possible    Peripheral neuropathy    Presence of cardiac pacemaker    S/P partial gastrectomy    due to severe gastric and gudoenal ulcers   Seasonal allergies    Sleep apnea    could not use CPAP   UTI (urinary tract infection)     Past Surgical History:  Procedure Laterality Date   BILIARY DILATION  02/01/2022   Procedure: BILIARY DILATION;  Surgeon: Irving Copas., MD;  Location: [facility];  Service: Gastroenterology;;   BILIARY DILATION  04/09/2022   Procedure: BILIARY  DILATION;  Surgeon: Irving Copas., MD;  Location: [facility];  Service: Gastroenterology;;   BILIARY STENT PLACEMENT N/A 11/30/2021   Procedure: BILIARY STENT PLACEMENT;  Surgeon: Irving Copas., MD;  Location: [facility];  Service: Gastroenterology;  Laterality: N/A;   BILIARY STENT PLACEMENT  02/01/2022   Procedure: BILIARY STENT PLACEMENT;  Surgeon: Rush Landmark Telford Nab., MD;  Location: [facility];  Service: Gastroenterology;;   BIOPSY  11/30/2021   Procedure: BIOPSY;  Surgeon: Irving Copas., MD;  Location: [facility];  Service: Gastroenterology;;   BIOPSY  04/09/2022   Procedure: BIOPSY;  Surgeon: Irving Copas., MD;  Location: [facility];  Service: Gastroenterology;;   CHOLECYSTECTOMY N/A 12/06/2021   Procedure: LAPAROSCOPIC CHOLECYSTECTOMY, LYSIS OF ADHESIONS;  Surgeon: Erroll Luna, MD;  Location: [facility];  Service: General;  Laterality: N/A;   COLONOSCOPY  02/11/2018   no polyps, + small internal hemorrhoids   ENDOSCOPIC RETROGRADE CHOLANGIOPANCREATOGRAPHY (ERCP) WITH PROPOFOL N/A 11/30/2021   Procedure: ENDOSCOPIC RETROGRADE CHOLANGIOPANCREATOGRAPHY (ERCP) WITH PROPOFOL;  Surgeon: Irving Copas., MD;  Location: [facility];  Service: Gastroenterology;  Laterality: N/A;   ENDOSCOPIC RETROGRADE CHOLANGIOPANCREATOGRAPHY (ERCP) WITH PROPOFOL N/A 02/01/2022   Procedure: ENDOSCOPIC RETROGRADE CHOLANGIOPANCREATOGRAPHY (ERCP) WITH PROPOFOL;  Surgeon: Rush Landmark Telford Nab., MD;  Location: [facility];  Service: Gastroenterology;  Laterality: N/A;   ENDOSCOPIC RETROGRADE CHOLANGIOPANCREATOGRAPHY (ERCP) WITH PROPOFOL N/A 04/09/2022   Procedure: ENDOSCOPIC RETROGRADE CHOLANGIOPANCREATOGRAPHY (ERCP) WITH PROPOFOL;  Surgeon: Rush Landmark Telford Nab., MD;  [facility];  Service: Gastroenterology;  Laterality: N/A;   ESOPHAGOGASTRODUODENOSCOPY (EGD) WITH PROPOFOL N/A 11/30/2021   Procedure: ESOPHAGOGASTRODUODENOSCOPY (EGD) WITH PROPOFOL;   Surgeon: Rush Landmark Telford Nab., MD;  [facility];  Service: Gastroenterology;  Laterality: N/A;   EUS N/A 12/12/2017   Procedure: UPPER ENDOSCOPIC ULTRASOUND (EUS) RADIAL;  Surgeon: [name], MD;  [facility];  Service: Endoscopy;  Laterality: N/A;   GASTRECTOMY     JOINT REPLACEMENT     pace maker     PARTIAL GASTRECTOMY     REMOVAL OF STONES  02/01/2022   Procedure: REMOVAL OF STONES;  Surgeon: Rush Landmark Telford Nab., MD;  Location: [facility];  Service: Gastroenterology;;   REMOVAL OF STONES  04/09/2022   Procedure: REMOVAL OF STONES;  Surgeon: Irving Copas., MD;  Location: [facility];  Service: Gastroenterology;;   Joan Mayans  11/30/2021   Procedure: Joan Mayans;  Surgeon: Irving Copas., MD;  Location: [facility];  Service: Gastroenterology;;   Lavell Islam REMOVAL  02/01/2022   Procedure: STENT REMOVAL;  Surgeon: Irving Copas., MD;  Location: [facility];  Service: Gastroenterology;;   Lavell Islam REMOVAL  04/09/2022   Procedure: STENT REMOVAL;  Surgeon: Irving Copas., MD;  Location: [facility];  Service: Gastroenterology;;   [place] INJECTION  11/30/2021   Procedure: SUBMUCOSAL TATTOO INJECTION;  Surgeon: Irving Copas., MD;  [facility];  Service: Gastroenterology;;   TONSILLECTOMY     45 years ago   UPPER GASTROINTESTINAL ENDOSCOPY      Current Medications: Current Meds  Medication Sig   Biotin 1000 MCG tablet Take 1,000 mcg by mouth daily.    carvedilol (COREG) 12.5 MG tablet Take 25 mg by mouth 2 (two) times daily with a meal.   colchicine 0.6 MG tablet Take 0.6 mg by mouth daily.   cyclobenzaprine (FLEXERIL) 5 MG tablet Take 5 mg by mouth 2 (two) times daily.   DULoxetine (CYMBALTA) 60 MG capsule Take 60 mg by mouth daily.   famotidine (PEPCID) 20 MG tablet Take 1 tablet (20 mg total) by mouth at bedtime.   fluticasone (FLONASE) 50 MCG/ACT nasal spray Place 2 sprays into both  nostrils daily.   gabapentin (NEURONTIN) 100 MG capsule Take 1 capsule (100 mg) every morning and afternoon, and take 4 capsules ('400mg'$ ) at bedtime (Patient taking differently: Take 100-400 mg by mouth See admin instructions. Take 1 capsule (100 mg) every morning and afternoon, and take 4 capsules ('400mg'$ ) at bedtime)   insulin glargine (LANTUS SOLOSTAR) 100 UNIT/ML Solostar Pen Inject 16 Units into the skin at bedtime.   insulin lispro (HUMALOG KWIKPEN) 200 UNIT/ML KwikPen Inject 6-10 Units into the skin 3 (three) times daily before meals. 80-150 6 units, 151-200 7 units, 201-250 8 units, 250-299 9 units. If >300 give 10 units, recheck at next meal, and call MD if multiple readings > 300 in a day.   Insulin Pen Needle (B-D ULTRAFINE III SHORT PEN) 31G X 8 MM MISC To use as directed with insulin, four times a day. Dx E11.9, Z79.4   losartan (COZAAR) 50 MG tablet Take 1 tablet (50 mg total) by mouth daily.   Multiple Vitamin (MULTIVITAMIN) tablet Take 1 tablet by mouth daily.    pantoprazole (PROTONIX) 40 MG tablet Take 40 mg by mouth daily.   rosuvastatin (CRESTOR) 10 MG tablet Take 1 tablet (10 mg total) by mouth daily.   solifenacin (VESICARE) 10 MG tablet Take 1 tablet (10 mg total) by mouth daily.     Allergies:   Asa [aspirin], Ibuprofen, Tylenol [acetaminophen], Ciprofloxacin, and Sulfa antibiotics   Social History   Socioeconomic History   Marital status: Widowed    Spouse name: Not on file   Number of children: 3   Years of education: Not on file   Highest education level: High school graduate  Occupational History   Not on file  Tobacco Use   Smoking status: Never   Smokeless tobacco: Never  Vaping Use   Vaping Use: Never used  Substance and Sexual Activity   Alcohol use: No    Alcohol/week: 0.0 standard drinks of alcohol   Drug use: No   Sexual activity: Not Currently  Other Topics Concern   Not on file  Social History Narrative   Used to live independently in [place]    However had significant issues with severe gastroparesis resulting in severe hypokalemia and general weakness and fall resulting in hospitalization and SNF rehab   Now (10/2017) living with her daughter in [place] until able to stabilize and regain independence.      May 2019   Patient living in assisted living, [place]   Social Determinants of Health   Financial Resource Strain: Low Risk  (01/24/2018)   Overall Financial Resource Strain (CARDIA)    Difficulty of Paying Living Expenses: Not hard at all  Food Insecurity: No Food Insecurity (01/24/2018)  Hunger Vital Sign    Worried About Running Out of Food in the Last Year: Never true    Ran Out of Food in the Last Year: Never true  Transportation Needs: No Transportation Needs (01/24/2018)   PRAPARE - Hydrologist (Medical): No    Lack of Transportation (Non-Medical): No  Physical Activity: Inactive (01/24/2018)   Exercise Vital Sign    Days of Exercise per Week: 0 days    Minutes of Exercise per Session: 0 min  Stress: No Stress Concern Present (01/24/2018)   [place]    Feeling of Stress : Not at all  Social Connections: Moderately Isolated (01/24/2018)   Social Connection and Isolation Panel [NHANES]    Frequency of Communication with Friends and Family: Once a week    Frequency of Social Gatherings with Friends and Family: Once a week    Attends Religious Services: More than 4 times per year    Active Member of [employer] or Organizations: No    Attends Archivist Meetings: Never    Marital Status: Widowed     Family History: The patient's family history significant for the absence of premature cardiac illness  ROS:   Please see the history of present illness.    All other systems are reviewed and are negative.   EKGs/Labs/Other Studies Reviewed:    The following studies were reviewed today: Comprehensive pacemaker interrogation  including testing of lead pacing thresholds.  EKG:  EKG is ordered today.  She has atrial sensed (sinus), ventricular paced rhythm with a very broad QRS at about 170 ms, QTc 516 seconds. Recent Labs: 12/10/2021: Magnesium 1.6 12/11/2021: BUN 6; Creatinine, Ser 0.59; Hemoglobin 11.8; Platelets 354; Potassium 3.7; Sodium 133 05/14/2022: ALT 9  11/29/2021 A1c 7.9% Recent Lipid Panel    Component Value Date/Time   CHOL 140 02/14/2021 1017   TRIG 95 02/14/2021 1017   HDL 56 02/14/2021 1017   CHOLHDL 2.5 02/14/2021 1017   LDLCALC 66 02/14/2021 1017    Physical Exam:    VS:  BP 138/75 (BP Location: Left Arm, Patient Position: Sitting, Cuff Size: Small)   Pulse 72   Ht 5' 3.5" (1.613 m)   Wt 53.3 kg   SpO2 97%   BMI 20.51 kg/m     Wt Readings from Last 3 Encounters:  11/05/22 53.3 kg  11/01/22 52.5 kg  08/30/22 57 kg      General: Alert, oriented x3, no distress, healthy left subclavian pacemaker site Head: no evidence of trauma, PERRL, EOMI, no exophtalmos or lid lag, no myxedema, no xanthelasma; normal ears, nose and oropharynx Neck: normal jugular venous pulsations and no hepatojugular reflux; brisk carotid pulses without delay and no carotid bruits Chest: clear to auscultation, no signs of consolidation by percussion or palpation, normal fremitus, symmetrical and full respiratory excursions Cardiovascular: normal position and quality of the apical impulse, regular rhythm, normal first and paradoxically split second heart sounds, no murmurs, rubs or gallops Abdomen: no tenderness or distention, no masses by palpation, no abnormal pulsatility or arterial bruits, normal bowel sounds, no hepatosplenomegaly Extremities: no clubbing, cyanosis or edema; 2+ radial, ulnar and brachial pulses bilaterally; 2+ right femoral, posterior tibial and dorsalis pedis pulses; 2+ left femoral, posterior tibial and dorsalis pedis pulses; no subclavian or femoral bruits Neurological: grossly  nonfocal Psych: Normal mood and affect    ASSESSMENT:    1. CHB (complete heart block) (HCC)   2.  Pacemaker   3. Controlled type 2 diabetes mellitus with diabetic neuropathy, with long-term current use of insulin ([place])   4. Benign essential HTN     PLAN:    In order of problems listed above:  CHB: Pacemaker dependent.   PPM: Normal device function (device not capable of remote monitoring).  Anticipate 12 months of remaining battery longevity.  We will start doing in office checks every 3 months. Recommend Aigis puch at the time of generator change. DM: Glycemic control has returned to desirable range after A1c was high following her problems with cholecystitis/pancreatitis.  Excellent LDL cholesterol . HTN: Adequate control    Medication Adjustments/Labs and Tests Ordered: Current medicines are reviewed at length with the patient today.  Concerns regarding medicines are outlined above.  Orders Placed This Encounter  Procedures   EKG 12-Lead   No orders of the defined types were placed in this encounter.   Patient Instructions  Medication Instructions:  Your physician recommends that you continue on your current medications as directed. Please refer to the Current Medication list given to you today.  *If you need a refill on your cardiac medications before your next appointment, please call your pharmacy*   Lab Work: None ordered.  If you have labs (blood work) drawn today and your tests are completely normal, you will receive your results only by: [facility] (if you have MyChart) OR A paper copy in the mail If you have any lab test that is abnormal or we need to change your treatment, we will call you to review the results.   Testing/Procedures: None ordered.    Follow-Up: At [facility], you and your health needs are our priority.  As part of our continuing mission to provide you with exceptional heart care, we have created designated Provider  Care Teams.  These Care Teams include your primary Cardiologist (physician) and Advanced Practice Providers (APPs -  Physician Assistants and Nurse Practitioners) who all work together to provide you with the care you need, when you need it.  We recommend signing up for the patient portal called "MyChart".  Sign up information is provided on this After Visit Summary.  MyChart is used to connect with patients for Virtual Visits (Telemedicine).  Patients are able to view lab/test results, encounter notes, upcoming appointments, etc.  Non-urgent messages can be sent to your provider as well.   To learn more about what you can do with MyChart, go to [email or web address].    Your next appointment:   3 month(s)  The format for your next appointment:   In Person  Provider:   Sanda Klein, MD   Important Information About Sugar         Signed, Sanda Klein, MD  11/05/2022 9:28 AM    [place]

## 2022-11-07 ENCOUNTER — Telehealth: Payer: Self-pay

## 2022-11-07 NOTE — Telephone Encounter (Signed)
Patient LVM on nurse line reporting she missed PCP phone call.   Patient did not leave any additional information and I do not see any recents notes.   I attempted to call her back, however no answer.   Will forward to PCP.

## 2022-11-12 ENCOUNTER — Encounter: Payer: Self-pay | Admitting: Student

## 2022-11-12 MED ORDER — AMOXICILLIN 500 MG PO TABS
500.0000 mg | ORAL_TABLET | Freq: Two times a day (BID) | ORAL | 0 refills | Status: AC
Start: 2022-11-12 — End: 2022-11-17

## 2022-11-12 NOTE — Telephone Encounter (Signed)
Daughter calls nurse line requesting to speak with PCP.   She reports an antibiotic was supposed to be called in.  Will forward to PCP.

## 2022-11-27 ENCOUNTER — Ambulatory Visit (INDEPENDENT_AMBULATORY_CARE_PROVIDER_SITE_OTHER): Payer: Medicare Other | Admitting: Pharmacist

## 2022-11-27 ENCOUNTER — Encounter: Payer: Self-pay | Admitting: Pharmacist

## 2022-11-27 VITALS — BP 138/47 | HR 65 | Wt 116.4 lb

## 2022-11-27 DIAGNOSIS — Z794 Long term (current) use of insulin: Secondary | ICD-10-CM

## 2022-11-27 DIAGNOSIS — I10 Essential (primary) hypertension: Secondary | ICD-10-CM

## 2022-11-27 DIAGNOSIS — E1140 Type 2 diabetes mellitus with diabetic neuropathy, unspecified: Secondary | ICD-10-CM

## 2022-11-27 MED ORDER — LANTUS SOLOSTAR 100 UNIT/ML ~~LOC~~ SOPN: 18.0000 [IU] | PEN_INJECTOR | Freq: Every day | SUBCUTANEOUS | 3 refills | Status: DC

## 2022-11-27 MED ORDER — HUMALOG KWIKPEN 200 UNIT/ML ~~LOC~~ SOPN: 6.0000 [IU] | PEN_INJECTOR | Freq: Three times a day (TID) | SUBCUTANEOUS | 11 refills | Status: DC

## 2022-11-27 NOTE — Patient Instructions (Signed)
Nice to see you today!  Your blood sugars are doing much better, now that you have recovered from your recent illness.   We made small adjustments to your insulin therapy today.   Lantus (insulin glargine) increase to 18 units each morning.   Humalog (insulin lispro) same scale to 300 - we changed the scall 300-350 = 10 units and > 350 is now 12 units.   Losartan '50mg'$  once daily.    We updated all medications with your facility to match our current plans.   Keep next appoint with Dr. Owens Shark

## 2022-11-27 NOTE — Progress Notes (Signed)
Reviewed: I agree with Dr. Koval's documentation and management. 

## 2022-11-27 NOTE — Progress Notes (Signed)
S:     Chief Complaint  Patient presents with   Medication Management    Diabetes - and Med Review   81 y.o. female who presents for diabetes evaluation, education, and management.  PMH is significant for heart block with pacer.  Patient was referred and last seen by Primary Care Provider, Dr. Owens Shark, on 11/01/2022.   At last pharmacist visit, we attempted to balance her insulin (basal-bolus) and refine her Humalog  sliding scale.    Today, patient arrives in good spirits and presents without any assistance. She is accompanied by her daughter, [name].  Since our last visit she reports that she was sick for ~ 3 weeks and had elevated blood sugars the entire time.   Family/Social History: Living in assisted living with medication administration support.   Current diabetes medications include: Lantus (insulin glargine) 16 units daily and Humalog (insulin lispro) sliding scale Current hypertension medications include: losartan '100mg'$  daily per [facility] report Current hyperlipidemia medications include: rosuvastatin '10mg'$  daily  Patient reports adherence to taking all medications as prescribed.  Patient requested clarification orders for discontinuing multiple medications on her [facility] list that she is not taking nor does she plan to use.  I agreed to review all medications and update assisted living with complete list.   Do you feel that your medications are working for you? yes Have you been experiencing any side effects to the medications prescribed? no Do you have any problems obtaining medications due to transportation or finances? no  Patient denies hypoglycemic events.  Patient reported dietary habits: Eats 3 meals/day  Patient-reported exercise habits: limited to walking - 3 laps around building when weather is acceptable.    O:   Review of Systems  All other systems reviewed and are negative.   Physical Exam Constitutional:      Appearance: Normal appearance. She is normal  weight.  Pulmonary:     Effort: Pulmonary effort is normal.  Neurological:     Mental Status: She is alert.  Psychiatric:        Mood and Affect: Mood normal.        Behavior: Behavior normal.        Thought Content: Thought content normal.      Patient had facility monitored CGM data and values recorded during illness were much higher than goal with multiple readings in the high 200s and low 300s.  Since 11/23/2022 blood sugar control has been much improved.  CGM data is for 1 week only.  CGM Download: 11/27/2022 % Time CGM is active: 65% Average Glucose: 171 mg/dL Glucose Management Indicator: N/A with 1 week data  Glucose Variability: 27.4% (goal <36%) Time in Goal:  - Time in range 70-180: 62% - Time above range: 34% (4% very high likely due to recent illness) - Time below range: 0% Observed patterns: Overall good control - elevated during recent illness.   Lab Results  Component Value Date   HGBA1C 7.5 (A) 11/01/2022   Vitals:   11/27/22 0959 11/27/22 1006  BP: (!) 129/49 (!) 138/47  Pulse: 65   SpO2: 99%     Lipid Panel     Component Value Date/Time   CHOL 140 02/14/2021 1017   TRIG 95 02/14/2021 1017   HDL 56 02/14/2021 1017   CHOLHDL 2.5 02/14/2021 1017   LDLCALC 66 02/14/2021 1017    A/P: Diabetes longstanding and currently doing much better following recent blood sugar elevations during recent illness. Patient is able to verbalize appropriate  hypoglycemia management plan. Medication adherence appears good. Control is suboptimal due to slightly elevated blood sugar control. -Increased dose of basal insulin Lantus (insulin glargine) from 16 to 18 units daily.  -Continued rapid insulin Humalog (insulin lispro) at same doses however added a > 350 step for patient to get 12 units if at that value.  -Patient educated on purpose, proper use, and potential adverse effects of hypoglycemia.  -Extensively discussed pathophysiology of diabetes, recommended lifestyle  interventions, dietary effects on blood sugar control.  -Counseled on s/sx of and management of hypoglycemia.  -Next A1c anticipated 2-3 months.   Hypertension longstanding with losartan '100mg'$  per facility MAR. Blood pressure at goal of <458 systolic mmHg.  Patient denied any dizziness. Medication adherence good. Blood pressure control is suboptimal due to low diastolic placing patient at risk for falls. -order to reduce losartan to '50mg'$  daily sent to facility  Medication reconcilliation: Discontinued the following due to no longer indicated or in use.  (Sent notification to care facility) Lidocaine patch, docusate, ondansetron, flucinonide cream, tessalon pearls. Patient and daughter agreed that medication regimens in EPIC/CHL and the assisted living facility were now in sync.     Written patient instructions provided. Patient verbalized understanding of treatment plan.  Total time in face to face counseling 44 minutes.    Follow-up:  Pharmacist PRN. PCP clinic visit in 12/03/2022.

## 2022-11-27 NOTE — Assessment & Plan Note (Signed)
Diabetes longstanding and currently doing much better following recent blood sugar elevations during recent illness. Patient is able to verbalize appropriate hypoglycemia management plan. Medication adherence appears good. Control is suboptimal due to slightly elevated blood sugar control. -Increased dose of basal insulin Lantus (insulin glargine) from 16 to 18 units daily.  -Continued rapid insulin Humalog (insulin lispro) at same doses however added a > 350 step for patient to get 12 units if at that value.  -Patient educated on purpose, proper use, and potential adverse effects of hypoglycemia.  -Extensively discussed pathophysiology of diabetes, recommended lifestyle interventions, dietary effects on blood sugar control.  -Counseled on s/sx of and management of hypoglycemia.  -Next A1c anticipated 2-3 months.

## 2022-11-27 NOTE — Assessment & Plan Note (Signed)
Hypertension longstanding with losartan '100mg'$  per facility MAR. Blood pressure at goal of <324 systolic mmHg.  Patient denied any dizziness. Medication adherence good. Blood pressure control is suboptimal due to low diastolic placing patient at risk for falls. -order to reduce losartan to '50mg'$  daily sent to facility

## 2022-12-03 ENCOUNTER — Ambulatory Visit (INDEPENDENT_AMBULATORY_CARE_PROVIDER_SITE_OTHER): Payer: Medicare Other | Admitting: Student

## 2022-12-03 VITALS — BP 130/58 | HR 70 | Ht 63.5 in | Wt 115.8 lb

## 2022-12-03 DIAGNOSIS — I10 Essential (primary) hypertension: Secondary | ICD-10-CM | POA: Diagnosis present

## 2022-12-03 DIAGNOSIS — Z794 Long term (current) use of insulin: Secondary | ICD-10-CM

## 2022-12-03 DIAGNOSIS — E1140 Type 2 diabetes mellitus with diabetic neuropathy, unspecified: Secondary | ICD-10-CM | POA: Diagnosis not present

## 2022-12-03 NOTE — Progress Notes (Signed)
    SUBJECTIVE:   CHIEF COMPLAINT / HPI:   Judith [name] is an 81 year-old female here for follow-up of T2DM and HTN.  T2DM Still getting CBGs in the 200-300s. Her Lantus was recently increased from 16 to 18 units daily by Dr. Everitt Amber on 11/27/2022. Continued her Humalog at the same dose, but had a greater than 350 step for patient to get a 12 units if at that value. Has been snacking more trying to gain some weight- eating chips, cheese.  HTN Taking losartan 50 mg daily and Coreg 25 mg twice daily. Losartan was recently reduced to 50 mg daily given blood pressure goal less than 960 systolic milligrams mercury. Denies any chest pain, headaches, dizziness, shortness of breath.  PERTINENT  PMH / PSH: Reviewed  OBJECTIVE:   BP (!) 130/58   Pulse 70   Ht 5' 3.5" (1.613 m)   Wt 115 lb 12.8 oz (52.5 kg)   BMI 20.19 kg/m   General: Pleasant, well-appearing but thin 81 year old female CV: Regular rate and rhythm Respiratory: Normal work of breathing on room air, no wheezing or crackles.  Good air movement throughout. Extremities: Distal pulses palpable   ASSESSMENT/PLAN:   Benign essential HTN Continue losartan 50 mg daily and Coreg 25 twice daily. No dizziness.  Does have low diastolic so, trying to avoid falls.  Controlled type 2 diabetes mellitus with diabetic neuropathy, with long-term current use of insulin (HCC) A1c currently at goal, 7.5%.  Discussed that hypoglycemia is much higher risk than hyperglycemia. Plan to increase basal insulin Lantus (insulin glargine) from 18 units to 20 units daily. Continue Humalog sliding scale as is. Discussed adding in high-protein snacks as well, and avoiding sugary foods that will cause fluctuations in blood sugars. Check A1c at next visit.   Patient to let me know if having oral surgery with or without general anesthesia, and will provide counseling on medications to take and/or hold on the day of surgery.  Orvis Brill, [place]

## 2022-12-03 NOTE — Assessment & Plan Note (Signed)
Continue losartan 50 mg daily and Coreg 25 twice daily. No dizziness.  Does have low diastolic so, trying to avoid falls.

## 2022-12-03 NOTE — Patient Instructions (Addendum)
It was great seeing you today.  We increased your Lantus from 18 units to 20 units daily. If you develop symptoms of hypoglycemia (sweaty, clammy, shaky, feelings of faintness), please go back down to 18 units daily.  Eat plenty of high-protein, high-calorie snacks as we discussed (peanut butter, nuts, [place] and cheese, [place] yogurt, eggs, cottage cheese)  I will plan to see you in January unless you need to see me before then to discuss oral surgery and which medicine to take around that time.  If you have any questions or concerns, please feel free to call the clinic.   Have a wonderful day,  Dr. Orvis Brill [facility] [phone]

## 2022-12-03 NOTE — Assessment & Plan Note (Signed)
A1c currently at goal, 7.5%.  Discussed that hypoglycemia is much higher risk than hyperglycemia. Plan to increase basal insulin Lantus (insulin glargine) from 18 units to 20 units daily. Continue Humalog sliding scale as is. Discussed adding in high-protein snacks as well, and avoiding sugary foods that will cause fluctuations in blood sugars. Check A1c at next visit.

## 2022-12-04 ENCOUNTER — Ambulatory Visit: Payer: Medicare Other | Admitting: Student

## 2022-12-06 ENCOUNTER — Encounter: Payer: Self-pay | Admitting: Cardiovascular Disease

## 2022-12-14 ENCOUNTER — Other Ambulatory Visit: Payer: Self-pay | Admitting: Student

## 2022-12-14 MED ORDER — INSULIN GLARGINE 100 UNIT/ML SOLOSTAR PEN: 16.0000 [IU] | PEN_INJECTOR | SUBCUTANEOUS | 11 refills | Status: DC

## 2022-12-14 MED ORDER — INSULIN GLARGINE 100 UNIT/ML SOLOSTAR PEN
16.0000 [IU] | PEN_INJECTOR | SUBCUTANEOUS | 11 refills | Status: DC
Start: 2022-12-14 — End: 2022-12-14

## 2022-12-14 NOTE — Progress Notes (Signed)
Per living facility, Judy's CBG have been low (into the 50s). New script written to decrease lantus to 16 units daily.  Added information to HOLD if glucose < 80.  Spoke with [facility]- they cannot take orders over the phone. Will fax over script to [place], DO

## 2022-12-28 ENCOUNTER — Ambulatory Visit (INDEPENDENT_AMBULATORY_CARE_PROVIDER_SITE_OTHER): Payer: Medicare Other | Admitting: Student

## 2022-12-28 VITALS — BP 146/66 | HR 78 | Temp 97.5°F | Ht 63.5 in | Wt 115.4 lb

## 2022-12-28 DIAGNOSIS — N390 Urinary tract infection, site not specified: Secondary | ICD-10-CM | POA: Insufficient documentation

## 2022-12-28 DIAGNOSIS — N3001 Acute cystitis with hematuria: Secondary | ICD-10-CM

## 2022-12-28 LAB — POCT URINALYSIS DIP (MANUAL ENTRY)
Bilirubin, UA: NEGATIVE
Glucose, UA: NEGATIVE mg/dL
Ketones, POC UA: NEGATIVE mg/dL
Nitrite, UA: NEGATIVE
Spec Grav, UA: 1.015 (ref 1.010–1.025)
Urobilinogen, UA: 0.2 E.U./dL
pH, UA: 6.5 (ref 5.0–8.0)

## 2022-12-28 LAB — POCT UA - MICROSCOPIC ONLY
RBC, Urine, Miroscopic: >20 (ref 0–2)
WBC, Ur, HPF, POC: >20 (ref 0–5)

## 2022-12-28 MED ORDER — CEPHALEXIN 500 MG PO CAPS: 500.0000 mg | ORAL_CAPSULE | Freq: Two times a day (BID) | ORAL | 0 refills | Status: AC

## 2022-12-28 NOTE — Progress Notes (Deleted)
  SUBJECTIVE:   CHIEF COMPLAINT / HPI:   Painful Urination:  complains of urinary frequency, urgency and dysuria x 2-3 days, without flank pain, fever, chills, or abnormal vaginal discharge or bleeding.   Pain in the lower stomach but no back pain.   Urinary incontinence that improved with the vesicare   PERTINENT  PMH / PSH: HTN, sleep apnea, h/o choledocholithiasis and biliary gastritis, gastroparesis, DM2, peripheral neuropathy, c-spine arthritis, depression, gout, HLD, postnasal drip, s/p partial gastrectomy, seasonal allergies, urinary incontinence   OBJECTIVE:  BP (!) 145/77   Pulse 78   Ht 5' 3.5" (1.613 m)   Wt 115 lb 6.4 oz (52.3 kg)   SpO2 97%   BMI 20.12 kg/m  Physical Exam   ASSESSMENT/PLAN:  Urinary tract infection without hematuria, site unspecified -     POCT urinalysis dipstick -     Urine Culture -     Urine Microscopic   Urine dipstick shows {ua dip:315113}.  Micro exam: {urine micro:315114}.  No follow-ups on file. Erskine Emery, MD 12/28/2022, 9:09 AM PGY-2, [place] {    This will disappear when note is signed, click to select method of visit    :1}

## 2022-12-28 NOTE — Assessment & Plan Note (Signed)
Urine dipstick shows positive for RBC's and positive for leukocytes.  Treat with Keflex and sent Ucx for speciation. Uncomplicated UTI, no systemic symptoms.

## 2022-12-28 NOTE — Patient Instructions (Addendum)
It was great to see you today! Thank you for choosing [facility] for your primary care. Judith [name] was seen for follow up.  Today we addressed: -Continue with Keflex twice a day for 7 total days  -I will call if there are going to be any changes to that regimen   If you haven't already, sign up for My Chart to have easy access to your labs results, and communication with your primary care physician.  We are checking some labs today. If they are abnormal, I will call you. If they are normal, I will send you a MyChart message (if it is active) or a letter in the mail. If you do not hear about your labs in the next 2 weeks, please call the office. I recommend that you always bring your medications to each appointment as this makes it easy to ensure you are on the correct medications and helps [place] not miss refills when you need them. Call the clinic at [phone] if your symptoms worsen or you have any concerns.  You should return to our clinic Return if symptoms worsen or fail to improve. Please arrive 15 minutes before your appointment to ensure smooth check in process.  We appreciate your efforts in making this happen.  Thank you for allowing me to participate in your care, Erskine Emery, MD 12/28/2022, 9:21 AM PGY-2, [place]

## 2022-12-28 NOTE — Progress Notes (Signed)
  SUBJECTIVE:   CHIEF COMPLAINT / HPI:   Painful Urination:  Complains of urinary frequency, urgency and dysuria x 3 days, without flank pain, fever, chills, or abnormal vaginal discharge. She reports feeling this 2 weeks ago that intermittently resolved with increased water intake.   Patient reports that she has hematuria when having UTI, has not had a UTI for several years.   Patient also has some urinary incontinence for which they are taking Vesicare.  She has reported that she previously had dribbling of her urine that has since improved.   PERTINENT  PMH / PSH: HTN, sleep apnea, h/o choledocholithiasis and biliary gastritis, gastroparesis, DM2, peripheral neuropathy, c-spine arthritis, depression, gout, HLD, postnasal drip, s/p partial gastrectomy, seasonal allergies, urinary incontinence   OBJECTIVE:  BP (!) 146/66   Pulse 78   Temp (!) 97.5 F (36.4 C)   Ht 5' 3.5" (1.613 m)   Wt 115 lb 6.4 oz (52.3 kg)   SpO2 97%   BMI 20.12 kg/m  Physical Exam Vitals reviewed.  Constitutional:      Appearance: Normal appearance. She is not toxic-appearing.  HENT:     Head: Normocephalic.     Nose: Nose normal.     Mouth/Throat:     Mouth: Mucous membranes are moist.  Eyes:     Conjunctiva/sclera: Conjunctivae normal.  Cardiovascular:     Rate and Rhythm: Normal rate and regular rhythm.  Pulmonary:     Effort: Pulmonary effort is normal.     Breath sounds: Normal breath sounds.  Abdominal:     General: Abdomen is flat. Bowel sounds are normal.     Palpations: Abdomen is soft.     Tenderness: There is no right CVA tenderness or left CVA tenderness.  Musculoskeletal:     Cervical back: Normal range of motion.  Skin:    Capillary Refill: Capillary refill takes less than 2 seconds.  Neurological:     General: No focal deficit present.     Mental Status: She is alert.  Psychiatric:        Mood and Affect: Mood normal.        Behavior: Behavior normal.       ASSESSMENT/PLAN:   Acute cystitis with hematuria Assessment & Plan: Urine dipstick shows positive for RBC's and positive for leukocytes.  Treat with Keflex and sent Ucx for speciation. Uncomplicated UTI, no systemic symptoms.    Orders: -     POCT urinalysis dipstick -     Urine Culture -     POCT UA - Microscopic Only -     Cephalexin; Take 1 capsule (500 mg total) by mouth 2 (two) times daily for 7 days. For 7 days  Dispense: 14 capsule; Refill: 0  Slightly elevated BP, just took BP medications prior to visit   Return if symptoms worsen or fail to improve. Erskine Emery, MD 12/28/2022, 1:06 PM PGY-2, [place]

## 2023-01-01 LAB — URINE CULTURE

## 2023-01-06 ENCOUNTER — Encounter: Payer: Self-pay | Admitting: Student

## 2023-01-07 ENCOUNTER — Encounter (HOSPITAL_BASED_OUTPATIENT_CLINIC_OR_DEPARTMENT_OTHER): Payer: Self-pay | Admitting: Emergency Medicine

## 2023-01-07 ENCOUNTER — Emergency Department (HOSPITAL_BASED_OUTPATIENT_CLINIC_OR_DEPARTMENT_OTHER)
Admission: EM | Admit: 2023-01-07 | Discharge: 2023-01-07 | Disposition: A | Discharge status: Home / Self Care | Payer: Medicare Other | Attending: Emergency Medicine | Admitting: Emergency Medicine

## 2023-01-07 ENCOUNTER — Other Ambulatory Visit: Payer: Self-pay

## 2023-01-07 ENCOUNTER — Emergency Department (HOSPITAL_BASED_OUTPATIENT_CLINIC_OR_DEPARTMENT_OTHER): Payer: Medicare Other

## 2023-01-07 DIAGNOSIS — S3992XA Unspecified injury of lower back, initial encounter: Secondary | ICD-10-CM | POA: Diagnosis present

## 2023-01-07 DIAGNOSIS — W010XXA Fall on same level from slipping, tripping and stumbling without subsequent striking against object, initial encounter: Secondary | ICD-10-CM | POA: Insufficient documentation

## 2023-01-07 DIAGNOSIS — S0990XA Unspecified injury of head, initial encounter: Secondary | ICD-10-CM | POA: Diagnosis not present

## 2023-01-07 DIAGNOSIS — Z794 Long term (current) use of insulin: Secondary | ICD-10-CM | POA: Diagnosis not present

## 2023-01-07 DIAGNOSIS — S32010A Wedge compression fracture of first lumbar vertebra, initial encounter for closed fracture: Secondary | ICD-10-CM | POA: Diagnosis not present

## 2023-01-07 MED ORDER — OXYCODONE HCL 5 MG PO TABS
5.0000 mg | ORAL_TABLET | Freq: Once | ORAL | Status: AC
  Administered 2023-01-07: 5 mg via ORAL
  Filled 2023-01-07: qty 1

## 2023-01-07 MED ORDER — OXYCODONE HCL 5 MG PO TABS: 5.0000 mg | ORAL_TABLET | Freq: Three times a day (TID) | ORAL | 0 refills | Status: AC | PRN

## 2023-01-07 NOTE — ED Notes (Signed)
Pt amb without difficulty or increased pain to the bathroom, standby assist. Back in bed on monitor, denies other needs at this time.

## 2023-01-07 NOTE — ED Notes (Signed)
Pt given crackers prior to pain meds and water, calling daughter for ride back to [place]

## 2023-01-07 NOTE — ED Triage Notes (Signed)
Fall  At [place] Trip/Fall backward and hit head, denies neck pain dizziness. Some low back pain. Witnessed fall. Skin tear left elbow, dressed at facility. VS 138/88 70hr 16 96% RA 171 CBG

## 2023-01-07 NOTE — ED Triage Notes (Addendum)
Fall witness by friend. Tripped over carpet. Hit lower back and back of head.  Now complains of pain in lower back and tenderness when palpating over cervical spine.  Small skin tear to left elbow dressed at facility  Ccollar applied in triage, Provider notified and CT scans ordered. Ice pack given for back pain

## 2023-01-07 NOTE — ED Provider Notes (Signed)
[facility] EMERGENCY DEPT Provider Note   CSN: [id number] Arrival date & time: 01/07/23  1743     History {Add pertinent medical, surgical, social history, OB history to HPI:1} Chief Complaint  Patient presents with   Judith [name] is a 82 y.o. female.  Patient is an 82 year old female presenting for chemical fall with head trauma.  Admits to falling forward after tripping on her rug directly prior to arrival.  Denies blood thinner use.  Admits to neck, thoracic, and lower back pain.  Denies any other bony pain at this time.  Denies any bleeding or open wounds.  The history is provided by the patient. No language interpreter was used.  Fall Pertinent negatives include no chest pain, no abdominal pain and no shortness of breath.       Home Medications Prior to Admission medications   Medication Sig Start Date End Date Taking? Authorizing Provider  Biotin 1000 MCG tablet Take 1,000 mcg by mouth daily.     [provider]  carvedilol (COREG) 25 MG tablet Take 25 mg by mouth 2 (two) times daily with a meal.    [provider]  cetirizine (ZYRTEC) 10 MG tablet Take 10 mg by mouth daily as needed for allergies.    [provider]  colchicine 0.6 MG tablet Take 0.6 mg by mouth daily.    [provider]  DULoxetine (CYMBALTA) 60 MG capsule Take 60 mg by mouth daily.    [provider]  famotidine (PEPCID) 20 MG tablet Take 1 tablet (20 mg total) by mouth at bedtime. 06/11/19   Jacelyn Pi, Lilia Argue, MD  fluticasone [facility]) 50 MCG/ACT nasal spray Place 2 sprays into both nostrils daily. 11/01/22   Dameron, Luna Fuse, DO  gabapentin (NEURONTIN) 100 MG capsule Take 1 capsule (100 mg) every morning and afternoon, and take 4 capsules ('400mg'$ ) at bedtime Patient taking differently: Take 100-400 mg by mouth See admin instructions. Take 1 capsule (100 mg) every morning and afternoon, and take 4 capsules ('400mg'$ ) at bedtime 04/03/18    Jacelyn Pi, Lilia Argue, MD  insulin glargine (LANTUS) 100 UNIT/ML Solostar Pen Inject 16 Units into the skin every morning. Do not administer if glucose level is LESS THAN 80. 12/14/22   Dameron, Luna Fuse, DO  insulin lispro (HUMALOG KWIKPEN) 200 UNIT/ML KwikPen Inject 6-12 Units into the skin 3 (three) times daily before meals. 80-150 6 units, 151-200 7 units, 201-250 8 units, 250-299 9 units. 300-350 =10 units and > 350 = 12 units, recheck at next meal, and call MD if multiple readings > 350 in a day. 11/27/22   Zenia Resides, MD  Insulin Pen Needle (B-D ULTRAFINE III SHORT PEN) 31G X 8 MM MISC To use as directed with insulin, four times a day. Dx E11.9, Z79.4 11/08/17   Jacelyn Pi, Lilia Argue, MD  losartan (COZAAR) 50 MG tablet Take 1 tablet (50 mg total) by mouth daily. 08/30/22   Zenia Resides, MD  Multiple Vitamin (MULTIVITAMIN) tablet Take 1 tablet by mouth daily.     [provider]  pantoprazole (PROTONIX) 40 MG tablet Take 40 mg by mouth daily.    [provider]  rosuvastatin (CRESTOR) 10 MG tablet Take 1 tablet (10 mg total) by mouth daily. 11/15/20   Just, Laurita Quint, FNP  solifenacin (VESICARE) 10 MG tablet Take 1 tablet (10 mg total) by mouth daily. 08/21/22   Orvis Brill, DO      Allergies  Asa [aspirin], Ibuprofen, Tylenol [acetaminophen], Ciprofloxacin, and Sulfa antibiotics    Review of Systems   Review of Systems  Constitutional:  Negative for chills and fever.  HENT:  Negative for ear pain and sore throat.   Eyes:  Negative for pain and visual disturbance.  Respiratory:  Negative for cough and shortness of breath.   Cardiovascular:  Negative for chest pain and palpitations.  Gastrointestinal:  Negative for abdominal pain and vomiting.  Genitourinary:  Negative for dysuria and hematuria.  Musculoskeletal:  Positive for back pain. Negative for arthralgias.  Skin:  Negative for color change and rash.  Neurological:  Negative for seizures and syncope.   All other systems reviewed and are negative.   Physical Exam Updated Vital Signs BP (!) 176/78 (BP Location: Right Arm)   Pulse 66   Temp 98.3 F (36.8 C) (Oral)   Resp 16   SpO2 97%  Physical Exam Vitals and nursing note reviewed.  Constitutional:      General: She is not in acute distress.    Appearance: She is well-developed.  HENT:     Head: Normocephalic and atraumatic.  Eyes:     General: Lids are normal. Vision grossly intact.     Conjunctiva/sclera: Conjunctivae normal.     Pupils: Pupils are equal, round, and reactive to light.  Cardiovascular:     Rate and Rhythm: Normal rate and regular rhythm.     Heart sounds: No murmur heard. Pulmonary:     Effort: Pulmonary effort is normal. No respiratory distress.     Breath sounds: Normal breath sounds.  Abdominal:     Palpations: Abdomen is soft.     Tenderness: There is no abdominal tenderness.  Musculoskeletal:        General: No swelling.     Cervical back: Neck supple. Tenderness and bony tenderness present. No deformity.     Thoracic back: Tenderness and bony tenderness present. No deformity.     Lumbar back: Tenderness and bony tenderness present. No deformity.  Skin:    General: Skin is warm and dry.     Capillary Refill: Capillary refill takes less than 2 seconds.  Neurological:     General: No focal deficit present.     Mental Status: She is alert and oriented to person, place, and time.     GCS: GCS eye subscore is 4. GCS verbal subscore is 5. GCS motor subscore is 6.     Cranial Nerves: Cranial nerves 2-12 are intact.     Sensory: Sensation is intact.     Motor: Motor function is intact.     Coordination: Coordination is intact.  Psychiatric:        Mood and Affect: Mood normal.     ED Results / Procedures / Treatments   Labs (all labs ordered are listed, but only abnormal results are displayed) Labs Reviewed - No data to display  EKG None  Radiology CT Lumbar Spine Wo Contrast  Result  Date: 01/07/2023 CLINICAL DATA:  Fall, striking head, low back pain. EXAM: CT THORACIC AND LUMBAR SPINE WITHOUT CONTRAST TECHNIQUE: Multidetector CT imaging of the thoracic and lumbar spine was performed without contrast. Multiplanar CT image reconstructions were also generated. RADIATION DOSE REDUCTION: This exam was performed according to the departmental dose-optimization program which includes automated exposure control, adjustment of the mA and/or kV according to patient size and/or use of iterative reconstruction technique. COMPARISON:  Multiple exams, including CT abdomen 11/28/2021 thoracic and lumbar spine CT from 04/02/2018 FINDINGS:  CT THORACIC SPINE FINDINGS Alignment: Mild dextroconvex midthoracic scoliosis. Vertebrae: No thoracic spine fracture or acute thoracic spine findings. Paraspinal and other soft tissues: Mild dependent atelectasis in both lower lobes. Coronary, aortic arch, and branch vessel atherosclerotic vascular disease. Mitral valve calcifications noted. Pacer leads noted. Disc levels: No significant impingement observed. CT LUMBAR SPINE FINDINGS Segmentation: The lowest lumbar type non-rib-bearing vertebra is labeled as L5. Alignment: Levoconvex lumbar scoliosis with rotary component. No subluxation. Vertebrae: Acute superior endplate compression fracture involving mainly 3 right side of the vertebral body, with cortical discontinuity along the superior endplate and about 09% loss of height. The cortical discontinuity is probably best shown around image 44 series 5 and on axial image 12 series 2. Middle column involvement is not uncertain but not directly seen. Small ossicle or chronically fragmented spur along the right T12-L1 facet joint is chronic. Multilevel Schmorl's nodes in the lumbar spine observed with loss of disc height and multilevel degenerative endplate sclerosis. No other fractures are seen. No posterior element fracture observed. Paraspinal and other soft tissues:  Atherosclerosis is present, including aortoiliac atherosclerotic disease. Disc levels: Lumbar spondylosis and degenerative disc disease resulting in right foraminal impingement at L2-3, L3-4, and L4-5; and left foraminal impingement at L5-S1. IMPRESSION: 1. Acute superior endplate compression fracture involving mainly the right side of the L1 vertebral body, with about 15% loss of height. Middle column involvement is not uncertain but not directly seen. 2. Mild dependent atelectasis in both lower lobes. 3. Mild dextroconvex midthoracic scoliosis. 4. Levoconvex lumbar scoliosis with rotary component. 5. Lumbar spondylosis and degenerative disc disease resulting in right foraminal impingement at L2-3, L3-4, and L4-5 and left foraminal impingement at L5-S1. 6. Aortic atherosclerosis. Mitral valve calcification. Coronary atherosclerosis. Pacemaker noted. Aortic Atherosclerosis (ICD10-I70.0). Electronically Signed   By: Van Clines M.D.   On: 01/07/2023 18:41   CT Thoracic Spine Wo Contrast  Result Date: 01/07/2023 CLINICAL DATA:  Fall, striking head, low back pain. EXAM: CT THORACIC AND LUMBAR SPINE WITHOUT CONTRAST TECHNIQUE: Multidetector CT imaging of the thoracic and lumbar spine was performed without contrast. Multiplanar CT image reconstructions were also generated. RADIATION DOSE REDUCTION: This exam was performed according to the departmental dose-optimization program which includes automated exposure control, adjustment of the mA and/or kV according to patient size and/or use of iterative reconstruction technique. COMPARISON:  Multiple exams, including CT abdomen 11/28/2021 thoracic and lumbar spine CT from 04/02/2018 FINDINGS: CT THORACIC SPINE FINDINGS Alignment: Mild dextroconvex midthoracic scoliosis. Vertebrae: No thoracic spine fracture or acute thoracic spine findings. Paraspinal and other soft tissues: Mild dependent atelectasis in both lower lobes. Coronary, aortic arch, and branch vessel  atherosclerotic vascular disease. Mitral valve calcifications noted. Pacer leads noted. Disc levels: No significant impingement observed. CT LUMBAR SPINE FINDINGS Segmentation: The lowest lumbar type non-rib-bearing vertebra is labeled as L5. Alignment: Levoconvex lumbar scoliosis with rotary component. No subluxation. Vertebrae: Acute superior endplate compression fracture involving mainly 3 right side of the vertebral body, with cortical discontinuity along the superior endplate and about 32% loss of height. The cortical discontinuity is probably best shown around image 44 series 5 and on axial image 12 series 2. Middle column involvement is not uncertain but not directly seen. Small ossicle or chronically fragmented spur along the right T12-L1 facet joint is chronic. Multilevel Schmorl's nodes in the lumbar spine observed with loss of disc height and multilevel degenerative endplate sclerosis. No other fractures are seen. No posterior element fracture observed. Paraspinal and other soft tissues: Atherosclerosis is present,  including aortoiliac atherosclerotic disease. Disc levels: Lumbar spondylosis and degenerative disc disease resulting in right foraminal impingement at L2-3, L3-4, and L4-5; and left foraminal impingement at L5-S1. IMPRESSION: 1. Acute superior endplate compression fracture involving mainly the right side of the L1 vertebral body, with about 15% loss of height. Middle column involvement is not uncertain but not directly seen. 2. Mild dependent atelectasis in both lower lobes. 3. Mild dextroconvex midthoracic scoliosis. 4. Levoconvex lumbar scoliosis with rotary component. 5. Lumbar spondylosis and degenerative disc disease resulting in right foraminal impingement at L2-3, L3-4, and L4-5 and left foraminal impingement at L5-S1. 6. Aortic atherosclerosis. Mitral valve calcification. Coronary atherosclerosis. Pacemaker noted. Aortic Atherosclerosis (ICD10-I70.0). Electronically Signed   By: Van Clines M.D.   On: 01/07/2023 18:41   CT Cervical Spine Wo Contrast  Result Date: 01/07/2023 CLINICAL DATA:  Fall, trauma EXAM: CT CERVICAL SPINE WITHOUT CONTRAST TECHNIQUE: Multidetector CT imaging of the cervical spine was performed without intravenous contrast. Multiplanar CT image reconstructions were also generated. RADIATION DOSE REDUCTION: This exam was performed according to the departmental dose-optimization program which includes automated exposure control, adjustment of the mA and/or kV according to patient size and/or use of iterative reconstruction technique. COMPARISON:  None Available. FINDINGS: Alignment: Mild reversal of cervical lordosis. Trace anterolisthesis C2 on C3, C3 on C4 and C4 on C5. Trace retrolisthesis C5 on C6. Facet alignment is within normal limits. Skull base and vertebrae: No acute fracture. No primary bone lesion or focal pathologic process. Soft tissues and spinal canal: No prevertebral fluid or swelling. No visible canal hematoma. Disc levels: Multilevel degenerative changes. Severe disc space narrowing C5-C6 and C6-C7. Facet degenerative changes at multiple levels with foraminal narrowing Upper chest: Negative. Other: None IMPRESSION: Reversal of cervical lordosis with multilevel degenerative changes. No acute osseous abnormality. Electronically Signed   By: Donavan Foil M.D.   On: 01/07/2023 18:36   CT Head Wo Contrast  Result Date: 01/07/2023 CLINICAL DATA:  Patient fell backwards and struck head. EXAM: CT HEAD WITHOUT CONTRAST TECHNIQUE: Contiguous axial images were obtained from the base of the skull through the vertex without intravenous contrast. RADIATION DOSE REDUCTION: This exam was performed according to the departmental dose-optimization program which includes automated exposure control, adjustment of the mA and/or kV according to patient size and/or use of iterative reconstruction technique. COMPARISON:  04/05/2016 FINDINGS: Brain: There is no evidence  for acute hemorrhage, hydrocephalus, mass lesion, or abnormal extra-axial fluid collection. No definite CT evidence for acute infarction. Diffuse loss of parenchymal volume is consistent with atrophy. Patchy low attenuation in the deep hemispheric and periventricular white matter is nonspecific, but likely reflects chronic microvascular ischemic demyelination. Vascular: No hyperdense vessel or unexpected calcification. Skull: No evidence for fracture. No worrisome lytic or sclerotic lesion. Sinuses/Orbits: The visualized paranasal sinuses and mastoid air cells are clear. Visualized portions of the globes and intraorbital fat are unremarkable. Other: None. IMPRESSION: 1. No acute intracranial abnormality. 2. Atrophy with chronic small vessel ischemic disease. Electronically Signed   By: Misty Stanley M.D.   On: 01/07/2023 18:30    Procedures Procedures  {Document cardiac monitor, telemetry assessment procedure when appropriate:1}  Medications Ordered in ED Medications - No data to display  ED Course/ Medical Decision Making/ A&P   {   Click here for ABCD2, HEART and other calculatorsREFRESH Note before signing :1}  Medical Decision Making Amount and/or Complexity of Data Reviewed Radiology: ordered.   44:20 PM 82 year old female presenting for chemical fall with head trauma.  Patient is alert oriented x 3, no acute distress, afebrile, stable vital signs.  Physical exam demonstrates no neurovascular deficits.  Patient has tenderness palpation of the midline spine of the cervical, thoracic, and lumbar regions.  No step-off or gross bony deformities.  CT head without contrast demonstrates no acute process. CT cervical, thoracic, and lumbar spine demonstrates: 1. Acute superior endplate compression fracture involving mainly the right side of the L1 vertebral body, with about 15% loss of height. Middle column involvement is not uncertain but not directly seen. 2. Mild  dependent atelectasis in both lower lobes. 3. Mild dextroconvex midthoracic scoliosis. 4. Levoconvex lumbar scoliosis with rotary component. 5. Lumbar spondylosis and degenerative disc disease resulting in right foraminal impingement at L2-3, L3-4, and L4-5 and left foraminal impingement at L5-S1. 6. Aortic atherosclerosis. Mitral valve calcification. Coronary atherosclerosis. Pacemaker noted.    {Document critical care time when appropriate:1} {Document review of labs and clinical decision tools ie heart score, Chads2Vasc2 etc:1}  {Document your independent review of radiology images, and any outside records:1} {Document your discussion with family members, caretakers, and with consultants:1} {Document social determinants of health affecting pt's care:1} {Document your decision making why or why not admission, treatments were needed:1} Final Clinical Impression(s) / ED Diagnoses Final diagnoses:  None    Rx / DC Orders ED Discharge Orders     None

## 2023-01-11 ENCOUNTER — Encounter: Payer: Self-pay | Admitting: Student

## 2023-01-11 ENCOUNTER — Ambulatory Visit (INDEPENDENT_AMBULATORY_CARE_PROVIDER_SITE_OTHER): Payer: Medicare Other | Admitting: Student

## 2023-01-11 VITALS — BP 128/60 | HR 74 | Ht 63.5 in | Wt 116.2 lb

## 2023-01-11 DIAGNOSIS — Z09 Encounter for follow-up examination after completed treatment for conditions other than malignant neoplasm: Secondary | ICD-10-CM

## 2023-01-11 DIAGNOSIS — W19XXXA Unspecified fall, initial encounter: Secondary | ICD-10-CM | POA: Insufficient documentation

## 2023-01-11 DIAGNOSIS — I10 Essential (primary) hypertension: Secondary | ICD-10-CM

## 2023-01-11 HISTORY — DX: Unspecified fall, initial encounter: W19.XXXA

## 2023-01-11 MED ORDER — ACETAMINOPHEN 500 MG PO TABS: 500.0000 mg | ORAL_TABLET | Freq: Four times a day (QID) | ORAL | 0 refills | Status: DC | PRN

## 2023-01-11 NOTE — Progress Notes (Signed)
    SUBJECTIVE:   CHIEF COMPLAINT / HPI:   Judith [name] is a very pleasant 82 year old female here with her daughter for ED follow-up.  On 01/07/2023, she tripped over her rug and fell, hitting her head.  She is not on any blood thinners.  Since then, she has been doing okay.  She still has some lower back pain.  She has a neurosurgery consultation coming up in the next few weeks. She has been taking oxycodone as needed, which her daughter worries affects her mind and her ability to walk steadily on her feet.  She has been using a rolling walker.  Imaging completed in the ER: CT head without contrast was normal. CT cervical, thoracic and lumbar spine did show a compression fracture on the right side of the L1 vertebral body.  PERTINENT  PMH / PSH: Reviewed  OBJECTIVE:   BP 128/60   Pulse 74   Ht 5' 3.5" (1.613 m)   Wt 116 lb 3.2 oz (52.7 kg)   SpO2 95%   BMI 20.26 kg/m   General: Well-appearing female, well-dressed and well-nourished CV: Regular rate and rhythm Respiratory: Normal work of breathing on room air, no wheezing or crackles MSK: Tenderness in quadratus lumborum, and over spinous process at L1-L3 no overlying erythema or warmth.  Patient is able to ambulate with rolling walker.   ASSESSMENT/PLAN:   Fall Follow-up after fall, with ED presentation.  She stable, well-appearing, here with her daughter and ambulates with rolling walker.  Vital signs are stable. Discussed that physical therapy would be important to maintain balance and steadiness on feet in addition to preventing deconditioning.  I placed referral today for this. Also encouraged her to use over-the-counter Tylenol as needed every 6 hours for pain.  She may take oxycodone as needed for severe pain, but given that this affects her gait and stability, encouraged her to avoid narcotics and other medications on the beers list. She has neurosurgery consultation scheduled. Follow-up in the next 1 to 2 months.      Orvis Brill, [place]

## 2023-01-11 NOTE — Assessment & Plan Note (Signed)
Follow-up after fall, with ED presentation.  She stable, well-appearing, here with her daughter and ambulates with rolling walker.  Vital signs are stable. Discussed that physical therapy would be important to maintain balance and steadiness on feet in addition to preventing deconditioning.  I placed referral today for this. Also encouraged her to use over-the-counter Tylenol as needed every 6 hours for pain.  She may take oxycodone as needed for severe pain, but given that this affects her gait and stability, encouraged her to avoid narcotics and other medications on the beers list. She has neurosurgery consultation scheduled. Follow-up in the next 1 to 2 months.

## 2023-01-11 NOTE — Progress Notes (Deleted)
    SUBJECTIVE:   CHIEF COMPLAINT / HPI:   No dizziness  Got teeth pulled Thursday, and wondered if she was dizzy from oxycodone.  History of overuse of tylenol caused mild liver damage that resolved. I reviewed her last labwork and CMP.   Taking 16 units daily of long-acting insulin.  PERTINENT  PMH / PSH: ***  OBJECTIVE:   BP 128/60   Pulse 74   Ht 5' 3.5" (1.613 m)   Wt 116 lb 3.2 oz (52.7 kg)   SpO2 95%   BMI 20.26 kg/m  ***   ASSESSMENT/PLAN:   No problem-specific Assessment & Plan notes found for this encounter.     Orvis Brill, [place]    {    This will disappear when note is signed, click to select method of visit    :1}

## 2023-01-11 NOTE — Patient Instructions (Addendum)
It was great seeing you today.  I placed an order for physical therapy. Let me know if you do not hear from the scheduler in 2 weeks.  I would like to see you back in 1 month for follow-up.  If you have any questions or concerns, please feel free to call the clinic.   Have a wonderful day,  Dr. Orvis Brill [facility] [phone]

## 2023-01-21 ENCOUNTER — Telehealth: Payer: Self-pay | Admitting: *Deleted

## 2023-01-21 NOTE — Telephone Encounter (Signed)
[facility] on [address]   Phone #: [phone]

## 2023-01-21 NOTE — Telephone Encounter (Signed)
Referral and last office note printed and faxed to [phone]

## 2023-01-21 NOTE — Telephone Encounter (Signed)
Daughter left message on referral line that this request will need to go to [facility] for patient.  They are unable to drive patient to an outpatient appt.  Will need to confirm location of nursing facility so I can fax the request for their in house PT to treat patient.  I left a message for daughter to call me back.  Deardra Hinkley,CMA

## 2023-01-24 ENCOUNTER — Encounter: Payer: Self-pay | Admitting: Student

## 2023-01-25 ENCOUNTER — Ambulatory Visit: Payer: Medicaid Other

## 2023-01-25 ENCOUNTER — Other Ambulatory Visit: Payer: Self-pay | Admitting: Student

## 2023-01-25 ENCOUNTER — Other Ambulatory Visit: Payer: Medicare Other

## 2023-01-25 DIAGNOSIS — R5383 Other fatigue: Secondary | ICD-10-CM

## 2023-01-26 LAB — COMPREHENSIVE METABOLIC PANEL
ALT: 13 IU/L (ref 0–32)
AST: 20 IU/L (ref 0–40)
Albumin/Globulin Ratio: 1.8 (ref 1.2–2.2)
Albumin: 3.9 g/dL (ref 3.7–4.7)
Alkaline Phosphatase: 110 IU/L (ref 44–121)
BUN/Creatinine Ratio: 20 (ref 12–28)
BUN: 18 mg/dL (ref 8–27)
Bilirubin Total: 0.6 mg/dL (ref 0.0–1.2)
CO2: 20 mmol/L (ref 20–29)
Calcium: 8.9 mg/dL (ref 8.7–10.3)
Chloride: 98 mmol/L (ref 96–106)
Creatinine, Ser: 0.9 mg/dL (ref 0.57–1.00)
Globulin, Total: 2.2 g/dL (ref 1.5–4.5)
Glucose: 299 mg/dL — ABNORMAL HIGH (ref 70–99)
Potassium: 4.5 mmol/L (ref 3.5–5.2)
Sodium: 136 mmol/L (ref 134–144)
Total Protein: 6.1 g/dL (ref 6.0–8.5)
eGFR: 64 mL/min/{1.73_m2} (ref >59)

## 2023-01-26 LAB — CBC WITH DIFFERENTIAL/PLATELET
Basophils Absolute: 0.1 10*3/uL (ref 0.0–0.2)
Basos: 1 %
EOS (ABSOLUTE): 0.7 10*3/uL — ABNORMAL HIGH (ref 0.0–0.4)
Eos: 8 %
Hematocrit: 37.8 % (ref 34.0–46.6)
Hemoglobin: 12.4 g/dL (ref 11.1–15.9)
Immature Grans (Abs): 0 10*3/uL (ref 0.0–0.1)
Immature Granulocytes: 0 %
Lymphocytes Absolute: 1.4 10*3/uL (ref 0.7–3.1)
Lymphs: 16 %
MCH: 31.6 pg (ref 26.6–33.0)
MCHC: 32.8 g/dL (ref 31.5–35.7)
MCV: 96 fL (ref 79–97)
Monocytes Absolute: 0.4 10*3/uL (ref 0.1–0.9)
Monocytes: 5 %
Neutrophils Absolute: 6.1 10*3/uL (ref 1.4–7.0)
Neutrophils: 70 %
Platelets: 259 10*3/uL (ref 150–450)
RBC: 3.93 x10E6/uL (ref 3.77–5.28)
RDW: 12.8 % (ref 11.7–15.4)
WBC: 8.6 10*3/uL (ref 3.4–10.8)

## 2023-01-29 ENCOUNTER — Encounter (INDEPENDENT_AMBULATORY_CARE_PROVIDER_SITE_OTHER): Payer: Commercial Managed Care - HMO | Admitting: Ophthalmology

## 2023-01-29 DIAGNOSIS — I10 Essential (primary) hypertension: Secondary | ICD-10-CM | POA: Diagnosis not present

## 2023-01-29 DIAGNOSIS — H43813 Vitreous degeneration, bilateral: Secondary | ICD-10-CM | POA: Diagnosis not present

## 2023-01-29 DIAGNOSIS — H35033 Hypertensive retinopathy, bilateral: Secondary | ICD-10-CM | POA: Diagnosis not present

## 2023-01-29 DIAGNOSIS — E113393 Type 2 diabetes mellitus with moderate nonproliferative diabetic retinopathy without macular edema, bilateral: Secondary | ICD-10-CM

## 2023-01-31 ENCOUNTER — Encounter: Payer: Self-pay | Admitting: Cardiovascular Disease

## 2023-01-31 ENCOUNTER — Ambulatory Visit: Payer: Medicare Other | Admitting: Cardiovascular Disease

## 2023-01-31 ENCOUNTER — Ambulatory Visit: Payer: Commercial Managed Care - HMO | Attending: Cardiovascular Disease | Admitting: Cardiovascular Disease

## 2023-01-31 VITALS — BP 139/66 | HR 69 | Ht 63.5 in | Wt 114.4 lb

## 2023-01-31 DIAGNOSIS — E1140 Type 2 diabetes mellitus with diabetic neuropathy, unspecified: Secondary | ICD-10-CM

## 2023-01-31 DIAGNOSIS — Z950 Presence of cardiac pacemaker: Secondary | ICD-10-CM | POA: Diagnosis not present

## 2023-01-31 DIAGNOSIS — I10 Essential (primary) hypertension: Secondary | ICD-10-CM

## 2023-01-31 DIAGNOSIS — Z794 Long term (current) use of insulin: Secondary | ICD-10-CM

## 2023-01-31 DIAGNOSIS — I442 Atrioventricular block, complete: Secondary | ICD-10-CM | POA: Diagnosis not present

## 2023-01-31 NOTE — Patient Instructions (Signed)
Medication Instructions:  Your physician recommends that you continue on your current medications as directed. Please refer to the Current Medication list given to you today.  *If you need a refill on your cardiac medications before your next appointment, please call your pharmacy*  Follow-Up: At [facility], you and your health needs are our priority.  As part of our continuing mission to provide you with exceptional heart care, we have created designated Provider Care Teams.  These Care Teams include your primary Cardiologist (physician) and Advanced Practice Providers (APPs -  Physician Assistants and Nurse Practitioners) who all work together to provide you with the care you need, when you need it.  We recommend signing up for the patient portal called "MyChart".  Sign up information is provided on this After Visit Summary.  MyChart is used to connect with patients for Virtual Visits (Telemedicine).  Patients are able to view lab/test results, encounter notes, upcoming appointments, etc.  Non-urgent messages can be sent to your provider as well.   To learn more about what you can do with MyChart, go to [email or web address].    Your next appointment:   2 month(s)  Provider:   Sanda Klein, MD

## 2023-01-31 NOTE — Progress Notes (Signed)
Cardiology office note:    Date:  02/01/2023   ID:  [name], DOB 19-Jan-1941, MRN [id number]  PCP:  Orvis Brill, DO  Cardiologist:  Sanda Klein, MD    Referring MD: Orvis Brill, DO   Chief complaint: pacemaker check  History of Present Illness:    Judith [name] is a 82 y.o. female with a hx of pacemaker implantation in [place].  She has complete heart block and is pacemaker dependent.  Her device is a Engineer, production S device that is not capable of remote downloads.  She was hospitalized 11/28/2021 with coledocholithiasis, had ERCP/biliary stent 11/30/2021 and lap cholecystectomy 12/06/2021,  ERCP 02/01/2022 for stent removal.  She had a fall (tripped over a rug) complicated by L1 vertebral fracture on 01/07/2023 which is being managed conservatively, with physical therapy.  She did have head impact, but no intracranial bleeding.  Imaging studies incidentally showed presence of aortic atherosclerosis, coronary atherosclerosis and mitral valve calcification.  She is recovering pretty well from the fall and is walking fairly briskly today.  She denies chest pain or shortness of breath at rest or with activity, lower extremity edema, PND, orthopnea, syncope, palpitations, focal neurological deficits or intermittent claudication.  She does not have lower extremity edema.  The pacemaker has less than 6 months of remaining battery longevity (implanted 2011).  As before, she remains in sinus rhythm with only 9% atrial pacing and has a good heart rate histogram distribution, but she requires 100% ventricular pacing (no escape rhythm).  There have been no episodes of high ventricular rates or atrial mode switch. Her initial pacemaker was implanted in 2007.  It appears that her ventricular lead is still the original one (Guidant [id number] implanted July 05, 2006).  June 09, 2010 she underwent a pacemaker generator change out and placement of a new atrial lead (Guidant [id number] [id number]).    She does not have a history of coronary disease.  She reports a normal stress test in the past.  She had an echocardiogram just a few months before coming to [place] and was told that her heart pumping strength is normal.  She was told that she has a murmur which is due to a mildly leaky valve.  There is no reported history of heart failure that I am aware of.   She does not have intermittent claudication and reports that lower extremity ABIs have been performed in the past with normal results.  She has never had a stroke or TIA.  She was taking lovastatin for lipid lowering but has been off this medication for a while.  She has lost a lot of weight since then.  Past Medical History:  Diagnosis Date   Anemia    Arthritis    Biliary gastritis    Blood transfusion without reported diagnosis    2007   Cervical spine arthritis 04/05/2016   Choledocholithiasis    Controlled type 2 diabetes mellitus with diabetic neuropathy, with long-term current use of insulin ([place])    Depression    Diabetes mellitus type 2 with complications ([place])    Dilated bile duct    Duodenal ulcer disease    Encounter for long-term (current) use of insulin ([place])    Gallstones 01/17/2018   Gastric ulcer    Gastritis and gastroduodenitis    Gastroparesis    severe   GERD (gastroesophageal reflux disease)    Gout    tested for gout but was not   Heart murmur  Hiatal hernia    Hyperlipidemia    in past not now   Hypertension    Hypokalemia, gastrointestinal losses    secondary to severe gastroparesis   Internal hemorrhoids    Long-term insulin use in type 2 diabetes ([place]) 04/05/2016   Osteoporosis    just had bone density test possible    Peripheral neuropathy    Presence of cardiac pacemaker    S/P partial gastrectomy    due to severe gastric and gudoenal ulcers   Seasonal allergies    Sleep apnea    could not use CPAP   UTI (urinary tract infection)     Past Surgical History:  Procedure  Laterality Date   BILIARY DILATION  02/01/2022   Procedure: BILIARY DILATION;  Surgeon: Irving Copas., MD;  Location: [place];  Service: Gastroenterology;;   BILIARY DILATION  04/09/2022   Procedure: BILIARY DILATION;  Surgeon: Irving Copas., MD;  Location: [facility];  Service: Gastroenterology;;   BILIARY STENT PLACEMENT N/A 11/30/2021   Procedure: BILIARY STENT PLACEMENT;  Surgeon: Irving Copas., MD;  Location: [facility];  Service: Gastroenterology;  Laterality: N/A;   BILIARY STENT PLACEMENT  02/01/2022   Procedure: BILIARY STENT PLACEMENT;  Surgeon: Rush Landmark Telford Nab., MD;  Location: [place];  Service: Gastroenterology;;   BIOPSY  11/30/2021   Procedure: BIOPSY;  Surgeon: Irving Copas., MD;  Location: [facility];  Service: Gastroenterology;;   BIOPSY  04/09/2022   Procedure: BIOPSY;  Surgeon: Irving Copas., MD;  Location: [facility];  Service: Gastroenterology;;   CHOLECYSTECTOMY N/A 12/06/2021   Procedure: LAPAROSCOPIC CHOLECYSTECTOMY, LYSIS OF ADHESIONS;  Surgeon: Erroll Luna, MD;  [facility]: [facility];  Service: General;  Laterality: N/A;   COLONOSCOPY  02/11/2018   no polyps, + small internal hemorrhoids   ENDOSCOPIC RETROGRADE CHOLANGIOPANCREATOGRAPHY (ERCP) WITH PROPOFOL N/A 11/30/2021   Procedure: ENDOSCOPIC RETROGRADE CHOLANGIOPANCREATOGRAPHY (ERCP) WITH PROPOFOL;  Surgeon: Irving Copas., MD;  Location: [facility];  Service: Gastroenterology;  Laterality: N/A;   ENDOSCOPIC RETROGRADE CHOLANGIOPANCREATOGRAPHY (ERCP) WITH PROPOFOL N/A 02/01/2022   Procedure: ENDOSCOPIC RETROGRADE CHOLANGIOPANCREATOGRAPHY (ERCP) WITH PROPOFOL;  Surgeon: Rush Landmark Telford Nab., MD;  Location: [place];  Service: Gastroenterology;  Laterality: N/A;   ENDOSCOPIC RETROGRADE CHOLANGIOPANCREATOGRAPHY (ERCP) WITH PROPOFOL N/A 04/09/2022   Procedure: ENDOSCOPIC RETROGRADE CHOLANGIOPANCREATOGRAPHY (ERCP) WITH PROPOFOL;  Surgeon:  Rush Landmark Telford Nab., MD;  [facility];  Service: Gastroenterology;  Laterality: N/A;   ESOPHAGOGASTRODUODENOSCOPY (EGD) WITH PROPOFOL N/A 11/30/2021   Procedure: ESOPHAGOGASTRODUODENOSCOPY (EGD) WITH PROPOFOL;  Surgeon: Rush Landmark Telford Nab., MD;  [facility];  Service: Gastroenterology;  Laterality: N/A;   EUS N/A 12/12/2017   Procedure: UPPER ENDOSCOPIC ULTRASOUND (EUS) RADIAL;  Surgeon: [name], MD;  [facility];  Service: Endoscopy;  Laterality: N/A;   GASTRECTOMY     JOINT REPLACEMENT     pace maker     PARTIAL GASTRECTOMY     REMOVAL OF STONES  02/01/2022   Procedure: REMOVAL OF STONES;  Surgeon: Rush Landmark Telford Nab., MD;  Location: [place];  Service: Gastroenterology;;   REMOVAL OF STONES  04/09/2022   Procedure: REMOVAL OF STONES;  Surgeon: Irving Copas., MD;  Location: [facility];  Service: Gastroenterology;;   Joan Mayans  11/30/2021   Procedure: Joan Mayans;  Surgeon: Irving Copas., MD;  Location: [facility];  Service: Gastroenterology;;   Lavell Islam REMOVAL  02/01/2022   Procedure: STENT REMOVAL;  Surgeon: Irving Copas., MD;  Location: [place];  Service: Gastroenterology;;   Lavell Islam REMOVAL  04/09/2022  Procedure: STENT REMOVAL;  Surgeon: Irving Copas., MD;  Location: [facility];  Service: Gastroenterology;;   SUBMUCOSAL TATTOO INJECTION  11/30/2021   Procedure: SUBMUCOSAL TATTOO INJECTION;  Surgeon: Irving Copas., MD;  [facility];  Service: Gastroenterology;;   TONSILLECTOMY     45 years ago   UPPER GASTROINTESTINAL ENDOSCOPY      Current Medications: Current Meds  Medication Sig   acetaminophen (TYLENOL) 500 MG tablet Take 1 tablet (500 mg total) by mouth every 6 (six) hours as needed.   Biotin 1000 MCG tablet Take 1,000 mcg by mouth daily.    carvedilol (COREG) 25 MG tablet Take 25 mg by mouth 2 (two) times daily with a meal.   colchicine 0.6 MG tablet  Take 0.6 mg by mouth daily.   DULoxetine (CYMBALTA) 60 MG capsule Take 60 mg by mouth daily.   famotidine (PEPCID) 20 MG tablet Take 1 tablet (20 mg total) by mouth at bedtime.   fluticasone (FLONASE) 50 MCG/ACT nasal spray Place 2 sprays into both nostrils daily.   gabapentin (NEURONTIN) 100 MG capsule Take 1 capsule (100 mg) every morning and afternoon, and take 4 capsules (420m) at bedtime (Patient taking differently: Take 100-400 mg by mouth See admin instructions. Take 1 capsule (100 mg) every morning and afternoon, and take 4 capsules (4036m at bedtime)   insulin glargine (LANTUS) 100 UNIT/ML Solostar Pen Inject 16 Units into the skin every morning. Do not administer if glucose level is LESS THAN 80.   insulin lispro (HUMALOG KWIKPEN) 200 UNIT/ML KwikPen Inject 6-12 Units into the skin 3 (three) times daily before meals. 80-150 6 units, 151-200 7 units, 201-250 8 units, 250-299 9 units. 300-350 =10 units and > 350 = 12 units, recheck at next meal, and call MD if multiple readings > 350 in a day. (Patient taking differently: Inject 6-12 Units into the skin 3 (three) times daily before meals. 80-150 6 units, 151-200 7 units, 201-250 8 units, 251-300 9 units. 301-350 =10 units and > 351>400 = 12 units, recheck at next meal, and call MD if multiple readings > 350 in a day.)   Insulin Pen Needle (B-D ULTRAFINE III SHORT PEN) 31G X 8 MM MISC To use as directed with insulin, four times a day. Dx E11.9, Z79.4   losartan (COZAAR) 50 MG tablet Take 1 tablet (50 mg total) by mouth daily.   Multiple Vitamin (MULTIVITAMIN) tablet Take 1 tablet by mouth daily.    pantoprazole (PROTONIX) 40 MG tablet Take 40 mg by mouth daily.   rosuvastatin (CRESTOR) 10 MG tablet Take 1 tablet (10 mg total) by mouth daily.   solifenacin (VESICARE) 10 MG tablet Take 1 tablet (10 mg total) by mouth daily.     Allergies:   Asa [aspirin], Ibuprofen, Ciprofloxacin, and Sulfa antibiotics   Social History   Socioeconomic  History   Marital status: Widowed    Spouse name: Not on file   Number of children: 3   Years of education: Not on file   Highest education level: High school graduate  Occupational History   Not on file  Tobacco Use   Smoking status: Never   Smokeless tobacco: Never  Vaping Use   Vaping Use: Never used  Substance and Sexual Activity   Alcohol use: No    Alcohol/week: 0.0 standard drinks of alcohol   Drug use: No   Sexual activity: Not Currently  Other Topics Concern   Not on file  Social History Narrative  Used to live independently in [place]   However had significant issues with severe gastroparesis resulting in severe hypokalemia and general weakness and fall resulting in hospitalization and SNF rehab   Now (10/2017) living with her daughter in [place] until able to stabilize and regain independence.      May 2019   Patient living in assisted living, [place]   Social Determinants of Health   Financial Resource Strain: Low Risk  (01/24/2018)   Overall Financial Resource Strain (CARDIA)    Difficulty of Paying Living Expenses: Not hard at all  Food Insecurity: No Food Insecurity (01/24/2018)   Hunger Vital Sign    Worried About Running Out of Food in the Last Year: Never true    [place] in the Last Year: Never true  Transportation Needs: No Transportation Needs (01/24/2018)   PRAPARE - Hydrologist (Medical): No    Lack of Transportation (Non-Medical): No  Physical Activity: Inactive (01/24/2018)   Exercise Vital Sign    Days of Exercise per Week: 0 days    Minutes of Exercise per Session: 0 min  Stress: No Stress Concern Present (01/24/2018)   [place]    Feeling of Stress : Not at all  Social Connections: Moderately Isolated (01/24/2018)   Social Connection and Isolation Panel [NHANES]    Frequency of Communication with Friends and Family: Once a week    Frequency of Social  Gatherings with Friends and Family: Once a week    Attends Religious Services: More than 4 times per year    Active Member of [employer] or Organizations: No    Attends Archivist Meetings: Never    Marital Status: Widowed     Family History: The patient's family history significant for the absence of premature cardiac illness  ROS:   Please see the history of present illness.    All other systems are reviewed and are negative.   EKGs/Labs/Other Studies Reviewed:    The following studies were reviewed today: Comprehensive pacemaker interrogation including threshold testing on both atrial and ventricular leads was performed today.  EKG:  EKG is not ordered today.  The most recent tracing from 11/05/2022 shows atrial sensed (sinus), ventricular paced rhythm. Recent Labs: 01/25/2023: ALT 13; BUN 18; Creatinine, Ser 0.90; Hemoglobin 12.4; Platelets 259; Potassium 4.5; Sodium 136  11/29/2021 A1c 7.9% Recent Lipid Panel    Component Value Date/Time   CHOL 140 02/14/2021 1017   TRIG 95 02/14/2021 1017   HDL 56 02/14/2021 1017   CHOLHDL 2.5 02/14/2021 1017   LDLCALC 66 02/14/2021 1017    Physical Exam:    VS:  BP 139/66   Pulse 69   Ht 5' 3.5" (1.613 m)   Wt 114 lb 6.4 oz (51.9 kg)   SpO2 95%   BMI 19.95 kg/m     Wt Readings from Last 3 Encounters:  01/31/23 114 lb 6.4 oz (51.9 kg)  01/11/23 116 lb 3.2 oz (52.7 kg)  12/28/22 115 lb 6.4 oz (52.3 kg)      General: Alert, oriented x3, no distress, very lean.  Healthy pacemaker site. Head: no evidence of trauma, PERRL, EOMI, no exophtalmos or lid lag, no myxedema, no xanthelasma; normal ears, nose and oropharynx Neck: normal jugular venous pulsations and no hepatojugular reflux; brisk carotid pulses without delay and no carotid bruits Chest: clear to auscultation, no signs of consolidation by percussion or palpation, normal fremitus, symmetrical and  full respiratory excursions Cardiovascular: normal position and quality  of the apical impulse, regular rhythm, normal first and paradoxically split second heart sounds, no murmurs, rubs or gallops Abdomen: no tenderness or distention, no masses by palpation, no abnormal pulsatility or arterial bruits, normal bowel sounds, no hepatosplenomegaly Extremities: no clubbing, cyanosis or edema; 2+ radial, ulnar and brachial pulses bilaterally; 2+ right femoral, posterior tibial and dorsalis pedis pulses; 2+ left femoral, posterior tibial and dorsalis pedis pulses; no subclavian or femoral bruits Neurological: grossly nonfocal Psych: Normal mood and affect     ASSESSMENT:    1. CHB (complete heart block) ([place])   2. Pacemaker   3. Controlled type 2 diabetes mellitus with diabetic neuropathy, with long-term current use of insulin ([place])   4. Essential hypertension      PLAN:    In order of problems listed above:  CHB: Pacemaker dependent, no escape rhythm. PPM: Her device is not capable of remote monitoring.  Will start bring her back to the office in 2 months as we are closer to [place].  Recommend implantation of an antibiotic pouch at the time of generator change out.  We have discussed the generator change out DM: Good glycemic control.  Most recent hemoglobin A1c 7.5% which I think is acceptable at age 14.  LDL cholesterol 66 on rosuvastatin.  Continue current medication. HTN: Adequate control    Medication Adjustments/Labs and Tests Ordered: Current medicines are reviewed at length with the patient today.  Concerns regarding medicines are outlined above.  No orders of the defined types were placed in this encounter.  No orders of the defined types were placed in this encounter.   Patient Instructions  Medication Instructions:  Your physician recommends that you continue on your current medications as directed. Please refer to the Current Medication list given to you today.  *If you need a refill on your cardiac medications before your next appointment,  please call your pharmacy*  Follow-Up: At [facility], you and your health needs are our priority.  As part of our continuing mission to provide you with exceptional heart care, we have created designated Provider Care Teams.  These Care Teams include your primary Cardiologist (physician) and Advanced Practice Providers (APPs -  Physician Assistants and Nurse Practitioners) who all work together to provide you with the care you need, when you need it.  We recommend signing up for the patient portal called "MyChart".  Sign up information is provided on this After Visit Summary.  MyChart is used to connect with patients for Virtual Visits (Telemedicine).  Patients are able to view lab/test results, encounter notes, upcoming appointments, etc.  Non-urgent messages can be sent to your provider as well.   To learn more about what you can do with MyChart, go to [email or web address].    Your next appointment:   2 month(s)  Provider:   Sanda Klein, MD       Signed, Sanda Klein, MD  02/01/2023 10:15 PM    [place]

## 2023-02-15 ENCOUNTER — Ambulatory Visit (INDEPENDENT_AMBULATORY_CARE_PROVIDER_SITE_OTHER): Payer: Medicare Other | Admitting: Student

## 2023-02-15 ENCOUNTER — Encounter: Payer: Self-pay | Admitting: Student

## 2023-02-15 VITALS — BP 124/60 | HR 84 | Ht 63.5 in | Wt 112.8 lb

## 2023-02-15 DIAGNOSIS — S32019A Unspecified fracture of first lumbar vertebra, initial encounter for closed fracture: Secondary | ICD-10-CM | POA: Diagnosis not present

## 2023-02-15 DIAGNOSIS — Z794 Long term (current) use of insulin: Secondary | ICD-10-CM

## 2023-02-15 DIAGNOSIS — S32019S Unspecified fracture of first lumbar vertebra, sequela: Secondary | ICD-10-CM

## 2023-02-15 DIAGNOSIS — W19XXXD Unspecified fall, subsequent encounter: Secondary | ICD-10-CM | POA: Diagnosis not present

## 2023-02-15 DIAGNOSIS — E1140 Type 2 diabetes mellitus with diabetic neuropathy, unspecified: Secondary | ICD-10-CM | POA: Diagnosis not present

## 2023-02-15 DIAGNOSIS — Z23 Encounter for immunization: Secondary | ICD-10-CM

## 2023-02-15 DIAGNOSIS — W19XXXS Unspecified fall, sequela: Secondary | ICD-10-CM

## 2023-02-15 DIAGNOSIS — S32019D Unspecified fracture of first lumbar vertebra, subsequent encounter for fracture with routine healing: Secondary | ICD-10-CM | POA: Diagnosis not present

## 2023-02-15 DIAGNOSIS — E1165 Type 2 diabetes mellitus with hyperglycemia: Secondary | ICD-10-CM | POA: Diagnosis not present

## 2023-02-15 LAB — POCT GLYCOSYLATED HEMOGLOBIN (HGB A1C): HbA1c, POC (controlled diabetic range): 7.1 % — AB (ref 0.0–7.0)

## 2023-02-15 LAB — GLUCOSE, POCT (MANUAL RESULT ENTRY): POC Glucose: 172 mg/dl — AB (ref 70–99)

## 2023-02-15 NOTE — Assessment & Plan Note (Addendum)
A1c 7.1%, at goal Have had continued discussion regarding reducing insulin and that hypoglycemia is much more dangerous than hyperglycemia Encouraged her to increase her caloric intake and drink Glucerna shakes Likely could reduce insulin intake, will continue discussion at next visit

## 2023-02-15 NOTE — Progress Notes (Signed)
    SUBJECTIVE:   CHIEF COMPLAINT / HPI:   Judith [name] is an 82 year-old female here with her daughter for follow-up of fall with L1 compression fracture, and T2DM.  Fall s/p L1 compression fracture Has not had anymore falls PT-completed multiple sessions, daughter wants her to have more for strengthening Will be having home nurse come in monthly Uses walker to ambulate, but can also ambulate independently with caution  T2DM A1c 7.1% CBGs on her glucometer mostly in 200-300s She is very cautious about eating carbohydrates and has fairly low caloric intake Still taking insulin (insulin glargine 16 units every morning, insulin lispro sliding scale with meals)  Weight loss Has slowly been trending down and lost around 10 pounds in the past year Likely due to anxiety surrounding diabetes and fear of hyperglycemia  PERTINENT  PMH / PSH: Reviewed  OBJECTIVE:   BP 124/60   Pulse 84   Ht 5' 3.5" (1.613 m)   Wt 112 lb 12.8 oz (51.2 kg)   SpO2 96%   BMI 19.67 kg/m   General: Thin elderly female in no distress, ambulates with walker Cardio: Normal S1 and S2, no S3 or S4. Rhythm is regular. No murmurs or rubs.   Pulm: Clear to auscultation bilaterally, no crackles, wheezing, or diminished breath sounds. Normal respiratory effort Abdomen: Bowel sounds normal. Abdomen soft and non-tender.  Extremities: No peripheral edema. Warm/ well perfused.  Strong radial pulses. Neuro: Cranial nerves grossly intact    ASSESSMENT/PLAN:   Controlled type 2 diabetes mellitus with diabetic neuropathy, with long-term current use of insulin (H[facility]) A1c 7.1%, at goal Have had continued discussion regarding reducing insulin and that hypoglycemia is much more dangerous than hyperglycemia Encouraged her to increase her caloric intake and drink Glucerna shakes Likely could reduce insulin intake, will continue discussion at next visit  Fall Stable and doing well.  No further falls Has been working with  PT Needs to optimize vitamin D and calcium intake Also likely needs to start bisphosphonate, will discuss at next visit  Received pneumonia and COVID vaccinations today.   Orvis Brill, [place]

## 2023-02-15 NOTE — Assessment & Plan Note (Signed)
Stable and doing well.  No further falls Has been working with PT Needs to optimize vitamin D and calcium intake Also likely needs to start bisphosphonate, will discuss at next visit

## 2023-02-15 NOTE — Patient Instructions (Addendum)
It was great seeing you today.  Your A1c is 7.1% which is at goal. Please try to increase your caloric intake and have plenty of protein a day with each meal.  You should ideally have 20 to 25 g of protein per meal.  You received your pneumonia and COVID shots today.  I wrote my recommendations to have more PT sessions for strengthening.  Will plan to see you in 3 months for follow-up.    If you have any questions or concerns, please feel free to call the clinic.   Have a wonderful day,  Dr. Orvis Brill [facility] [phone]

## 2023-02-15 NOTE — Progress Notes (Deleted)
    SUBJECTIVE:   CHIEF COMPLAINT / HPI:   One more PT session  Exercising daily  Pain is improving  Has had shingles shot Would like pneumonia shot COVID  PERTINENT  PMH / PSH: ***  OBJECTIVE:   BP 124/60   Pulse 84   Ht 5' 3.5" (1.613 m)   Wt 112 lb 12.8 oz (51.2 kg)   SpO2 96%   BMI 19.67 kg/m  ***   ASSESSMENT/PLAN:   No problem-specific Assessment & Plan notes found for this encounter.     Orvis Brill, [place]    {    This will disappear when note is signed, click to select method of visit    :1}

## 2023-02-27 ENCOUNTER — Other Ambulatory Visit: Payer: Self-pay | Admitting: Student

## 2023-03-19 ENCOUNTER — Telehealth: Payer: Self-pay

## 2023-03-19 NOTE — Telephone Encounter (Signed)
Received call from [facility], nurse at [facility] regarding patient's elevated blood sugar reading.   Reports reading of 519. Patient is asymptomatic. Reports that patient received 12 units of Humalog at lunch and 16 units of Lantus this AM. Unsure if patient received Humalog at breakfast.   Spoke with Dr. Owens Shark and Dr. Valentina Lucks regarding patient. Advised that patient receive 12 units of Humalog and resume normal insulin schedule tomorrow.   Provided RN with these instructions.   Talbot Grumbling, RN

## 2023-03-22 ENCOUNTER — Other Ambulatory Visit: Payer: Commercial Managed Care - HMO

## 2023-04-04 ENCOUNTER — Ambulatory Visit: Payer: Medicare Other | Attending: Cardiovascular Disease | Admitting: Cardiovascular Disease

## 2023-04-04 ENCOUNTER — Encounter: Payer: Self-pay | Admitting: Cardiovascular Disease

## 2023-04-04 VITALS — BP 138/64 | HR 66 | Ht 63.5 in | Wt 113.6 lb

## 2023-04-04 DIAGNOSIS — I442 Atrioventricular block, complete: Secondary | ICD-10-CM

## 2023-04-04 DIAGNOSIS — Z950 Presence of cardiac pacemaker: Secondary | ICD-10-CM

## 2023-04-04 NOTE — Patient Instructions (Signed)
Medication Instructions:  Hold any diabetic medications on the day of procedure *If you need a refill on your cardiac medications before your next appointment, please call your pharmacy*   Lab Work: CBC, BMP- today If you have labs (blood work) drawn today and your tests are completely normal, you will receive your results only by: MyChart Message (if you have MyChart) OR A paper copy in the mail If you have any lab test that is abnormal or we need to change your treatment, we will call you to review the results.   Testing/Procedures:    Implantable Device Instructions    Judith [name]  04/04/2023  You are scheduled for a PPM generator change on Thursday, May 2 with Dr. Rachelle Hora Croitoru.  1. Pre procedure Lab testing:  Getting labs 04/04/23    2. Please arrive at the Main Entrance A at [facility]: [address] [place], [place] [address] on May 2 at 10:30 AM (This time is two hours before your procedure to ensure your preparation). Free valet parking service is available. You will check in at ADMITTING. The support person will be asked to wait in the waiting room.  It is OK to have someone drop you off and come back when you are ready to be discharged.        Special note: Every effort is made to have your procedure done on time. Please understand that emergencies sometimes delay  scheduled procedures.  3.  No eating or drinking after midnight prior to procedure.     4.  Medication instructions:  On the morning of your procedure Hold any diabetic medications   5.  The night before your procedure and the morning of your procedure scrub your neck/chest with CHG surgical scrub.  See instruction letter.  6. Plan to go home the same day, you will only stay overnight if medically necessary. 7.  You MUST have a responsible adult to drive you home. 8.   An adult MUST be with you the first 24 hours after you arrive home. 9..  Bring a current list of your medications, and the last  time and date medication taken. 10. Bring ID and current insurance cards. 11. .Please wear clothes that are easy to get on and off and wear slip-on shoes.    You will follow up with the [facility] Device clinic 10-14 days after your procedure.  You will follow up with Dr. Rachelle Hora Croitoru 91 days after your procedure.  These appointments will be made for you.   * If you have ANY questions after you get home, please call the office at [phone] or send a MyChart message.  FYI: For your safety, and to allow [place] to monitor your vital signs accurately during the surgery/procedure we request that if you have artificial nails, gel coating, SNS etc. Please have those removed prior to your surgery/procedure. Not having the nail coverings /polish removed may result in cancellation or delay of your surgery/procedure.    [place] - Preparing For Surgery    Before surgery, you can play an important role. Because skin is not sterile, your skin needs to be as free of germs as possible. You can reduce the number of germs on your skin by washing with CHG (chlorahexidine gluconate) Soap before surgery.  CHG is an antiseptic cleaner which kills germs and bonds with the skin to continue killing germs even after washing.  Please do not use if you have an allergy to  CHG or antibacterial soaps.  If your skin becomes reddened/irritated stop using the CHG.   Do not shave (including legs and underarms) for at least 48 hours prior to first CHG shower.  It is OK to shave your face.  Please follow these instructions carefully:  1.  Shower the night before surgery and the morning of surgery with CHG.  2.  If you choose to wash your hair, wash your hair first as usual with your normal shampoo.  3.  After you shampoo, rinse your hair and body thoroughly to remove the shampoo.  4.  Use CHG as you would any other liquid soap.  You can apply CHG directly to the skin and wash gently with a clean washcloth. 5.  Apply the CHG  Soap to your body ONLY FROM THE NECK DOWN.  Do not use on open wounds or open sores.  Avoid contact with your eyes, ears, mouth and genitals (private parts).  Wash genitals (private parts) with your normal soap.  6.  Wash thoroughly, paying special attention to the area where your surgery will be performed.  7.  Thoroughly rinse your body with warm water from the neck down.   8.  DO NOT shower/wash with your normal soap after using and rinsing off the CHG soap.  9.  Pat yourself dry with a clean towel.           10.  Wear clean pajamas.           11.  Place clean sheets on your bed the night of your first shower and do not sleep with pets.  Day of Surgery: Do not apply any deodorants/lotions.  Please wear clean clothes to the hospital/surgery center.    Follow-Up: At [facility], you and your health needs are our priority.  As part of our continuing mission to provide you with exceptional heart care, we have created designated Provider Care Teams.  These Care Teams include your primary Cardiologist (physician) and Advanced Practice Providers (APPs -  Physician Assistants and Nurse Practitioners) who all work together to provide you with the care you need, when you need it.  We recommend signing up for the patient portal called "MyChart".  Sign up information is provided on this After Visit Summary.  MyChart is used to connect with patients for Virtual Visits (Telemedicine).  Patients are able to view lab/test results, encounter notes, upcoming appointments, etc.  Non-urgent messages can be sent to your provider as well.   To learn more about what you can do with MyChart, go to [email or web address].    Your next appointment:    You will be scheduled for follow up visits after procedure

## 2023-04-04 NOTE — Progress Notes (Signed)
Cardiology office note:    Date:  04/04/2023   ID:  Judith [name], DOB 11-08-1941, MRN [id number]  PCP:  Darral Dashameron, Marisa, DO  Cardiologist:  Thurmon FairMihai Armetta Henri, MD    Referring MD: Darral Dashameron, Marisa, DO   Chief complaint: pacemaker check  History of Present Illness:    Judith [name] is a 82 y.o. female [place]  She has complete heart block and is pacemaker dependent.  Her device is a Geophysicist/field seismologist[employer] ultra 60 S device that is not capable of remote downloads.  She has done well in the last couple of months.  She has not had any episodes of dizziness, syncope, falls, fever or chills, bleeding, shortness of breath at rest with activity, chest pain, palpitations.    In November her pacemaker reported roughly 12 months of estimated longevity, but in February it already reported less than 6 months.  Is still reporting "less than 6 months".  The device is about 83 years old.  She has no need for atrial pacing and has good heart rate histograms but she has 100% ventricular pacing.  There have been no episodes of high ventricular rate or atrial mode switch.  Lead parameters are all excellent.  There is no sensed R wave.  Her initial pacemaker was implanted in 2007.  It appears that her ventricular lead is still the original one (Guidant [id number] implanted July 05, 2006).  June 09, 2010 she underwent a pacemaker generator change out and placement of a new atrial lead (Guidant [id number]).   She does not have a history of coronary disease.  She reports a normal stress test in the past.  She had an echocardiogram just a few months before coming to [place] and was told that her heart pumping strength is normal.  She was told that she has a murmur which is due to a mildly leaky valve.  There is no reported history of heart failure that I am aware of.   She does not have intermittent claudication and reports that lower extremity ABIs have been performed in the past with  normal results.  She has never had a stroke or TIA.  She was taking lovastatin for lipid lowering but has been off this medication for a while.  She has lost a lot of weight since then.  She was hospitalized 11/28/2021 with coledocholithiasis, had ERCP/biliary stent 11/30/2021 and lap cholecystectomy 12/06/2021,  ERCP 02/01/2022 for stent removal.  She had a fall (tripped over a rug) complicated by L1 vertebral fracture on 01/07/2023 which is being managed conservatively, with physical therapy.  She did have head impact, but no intracranial bleeding.  Imaging studies incidentally showed presence of aortic atherosclerosis, coronary atherosclerosis and mitral valve calcification.  She is recovering pretty well from the fall and is walking fairly briskly today.  Past Medical History:  Diagnosis Date   Anemia    Arthritis    Biliary gastritis    Blood transfusion without reported diagnosis    2007   Cervical spine arthritis 04/05/2016   Choledocholithiasis    Controlled type 2 diabetes mellitus with diabetic neuropathy, with long-term current use of insulin    Depression    Diabetes mellitus type 2 with complications    Dilated bile duct    Duodenal ulcer disease    Encounter for long-term (current) use of insulin    Gallstones 01/17/2018   Gastric ulcer    Gastritis and gastroduodenitis    Gastroparesis  severe   GERD (gastroesophageal reflux disease)    Gout    tested for gout but was not   Heart murmur    Hiatal hernia    Hyperlipidemia    in past not now   Hypertension    Hypokalemia, gastrointestinal losses    secondary to severe gastroparesis   Internal hemorrhoids    Long-term insulin use in type 2 diabetes 04/05/2016   Osteoporosis    just had bone density test possible    Peripheral neuropathy    Presence of cardiac pacemaker    S/P partial gastrectomy    due to severe gastric and gudoenal ulcers   Seasonal allergies    Sleep apnea    could not use CPAP   UTI  (urinary tract infection)     Past Surgical History:  Procedure Laterality Date   BILIARY DILATION  02/01/2022   Procedure: BILIARY DILATION;  Surgeon: Lemar Lofty., MD;  Location[facility] ENDOSCOPY;  Service: Gastroenterology;;   BILIARY DILATION  04/09/2022   Procedure: BILIARY DILATION;  Surgeon: Lemar Lofty., MD;  Location: [facility] ENDOSCOPY;  Service: Gastroenterology;;   BILIARY STENT PLACEMENT N/A 11/30/2021   Procedure: BILIARY STENT PLACEMENT;  Surgeon: Lemar Lofty., MD;  Location: [facility] ENDOSCOPY;  Service: Gastroenterology;  Laterality: N/A;   BILIARY STENT PLACEMENT  02/01/2022   Procedure: BILIARY STENT PLACEMENT;  Surgeon: Meridee Score Netty Starring., MD;  Location: [facility] ENDOSCOPY;  Service: Gastroenterology;;   BIOPSY  11/30/2021   Procedure: BIOPSY;  Surgeon: Lemar Lofty., MD;  Location: [facility] ENDOSCOPY;  Service: Gastroenterology;;   BIOPSY  04/09/2022   Procedure: BIOPSY;  Surgeon: Lemar Lofty., MD;  Location: [facility] ENDOSCOPY;  Service: Gastroenterology;;   CHOLECYSTECTOMY N/A 12/06/2021   Procedure: LAPAROSCOPIC CHOLECYSTECTOMY, LYSIS OF ADHESIONS;  Surgeon: Harriette Bouillon, MD;  Location: [facility] ORS;  Service: General;  Laterality: N/A;   COLONOSCOPY  02/11/2018   no polyps, + small internal hemorrhoids   ENDOSCOPIC RETROGRADE CHOLANGIOPANCREATOGRAPHY (ERCP) WITH PROPOFOL N/A 11/30/2021   Procedure: ENDOSCOPIC RETROGRADE CHOLANGIOPANCREATOGRAPHY (ERCP) WITH PROPOFOL;  Surgeon: Lemar Lofty., MD;  Location: [facility] ENDOSCOPY;  Service: Gastroenterology;  Laterality: N/A;   ENDOSCOPIC RETROGRADE CHOLANGIOPANCREATOGRAPHY (ERCP) WITH PROPOFOL N/A 02/01/2022   Procedure: ENDOSCOPIC RETROGRADE CHOLANGIOPANCREATOGRAPHY (ERCP) WITH PROPOFOL;  Surgeon: Meridee Score Netty Starring., MD;  Location: [facility] ENDOSCOPY;  Service: Gastroenterology;  Laterality: N/A;   ENDOSCOPIC RETROGRADE CHOLANGIOPANCREATOGRAPHY (ERCP) WITH PROPOFOL N/A 04/09/2022   Procedure: ENDOSCOPIC  RETROGRADE CHOLANGIOPANCREATOGRAPHY (ERCP) WITH PROPOFOL;  Surgeon: Meridee Score Netty Starring., MD;  Location: [facility] ENDOSCOPY;  Service: Gastroenterology;  Laterality: N/A;   ESOPHAGOGASTRODUODENOSCOPY (EGD) WITH PROPOFOL N/A 11/30/2021   Procedure: ESOPHAGOGASTRODUODENOSCOPY (EGD) WITH PROPOFOL;  Surgeon: Meridee Score Netty Starring., MD;  Location: [facility] ENDOSCOPY;  Service: Gastroenterology;  Laterality: N/A;   EUS N/A 12/12/2017   Procedure: UPPER ENDOSCOPIC ULTRASOUND (EUS) RADIAL;  Surgeon: Rachael Fee, MD;  Location: [facility] ENDOSCOPY;  Service: Endoscopy;  Laterality: N/A;   GASTRECTOMY     JOINT REPLACEMENT     pace maker     PARTIAL GASTRECTOMY     REMOVAL OF STONES  02/01/2022   Procedure: REMOVAL OF STONES;  Surgeon: Meridee Score Netty Starring., MD;  Location: [facility] ENDOSCOPY;  Service: Gastroenterology;;   REMOVAL OF STONES  04/09/2022   Procedure: REMOVAL OF STONES;  Surgeon: Lemar Lofty., MD;  Location: [facility] ENDOSCOPY;  Service: Gastroenterology;;   Dennison Mascot  11/30/2021   Procedure: Dennison Mascot;  Surgeon: Lemar Lofty., MD;  Location: [facility] ENDOSCOPY;  Service: Gastroenterology;;   Francine Graven REMOVAL  02/01/2022   Procedure: STENT REMOVAL;  Surgeon: Lemar Lofty., MD;  Location: [facility] ENDOSCOPY;  Service: Gastroenterology;;   Francine Graven REMOVAL  04/09/2022   Procedure: STENT REMOVAL;  Surgeon: Lemar Lofty., MD;  Location: [facility] ENDOSCOPY;  Service: Gastroenterology;;   SUBMUCOSAL TATTOO INJECTION  11/30/2021   Procedure: SUBMUCOSAL TATTOO INJECTION;  Surgeon: Lemar Lofty., MD;  Location: [facility] ENDOSCOPY;  Service: Gastroenterology;;   TONSILLECTOMY     45 years ago   UPPER GASTROINTESTINAL ENDOSCOPY      Current Medications: Current Meds  Medication Sig   acetaminophen (TYLENOL) 500 MG tablet Take 1 tablet (500 mg total) by mouth every 6 (six) hours as needed.   Biotin 1000 MCG tablet Take 1,000 mcg by mouth daily.    carvedilol (COREG) 25 MG tablet Take 25 mg  by mouth 2 (two) times daily with a meal.   colchicine 0.6 MG tablet Take 0.6 mg by mouth daily.   DULoxetine (CYMBALTA) 60 MG capsule Take 60 mg by mouth daily.   famotidine (PEPCID) 20 MG tablet Take 1 tablet (20 mg total) by mouth at bedtime.   fluticasone (FLONASE) 50 MCG/ACT nasal spray Place 2 sprays into both nostrils daily.   gabapentin (NEURONTIN) 100 MG capsule Take 1 capsule (100 mg) every morning and afternoon, and take 4 capsules (400mg ) at bedtime (Patient taking differently: Take 100-400 mg by mouth See admin instructions. Take 1 capsule (100 mg) every morning and afternoon, and take 4 capsules (400mg ) at bedtime)   insulin glargine (LANTUS) 100 UNIT/ML Solostar Pen Inject 16 Units into the skin every morning. Do not administer if glucose level is LESS THAN 80.   insulin lispro (HUMALOG KWIKPEN) 200 UNIT/ML KwikPen Inject 6-12 Units into the skin 3 (three) times daily before meals. 80-150 6 units, 151-200 7 units, 201-250 8 units, 250-299 9 units. 300-350 =10 units and > 350 = 12 units, recheck at next meal, and call MD if multiple readings > 350 in a day. (Patient taking differently: Inject 6-12 Units into the skin 3 (three) times daily before meals. 80-150 6 units, 151-200 7 units, 201-250 8 units, 251-300 9 units. 301-350 =10 units and > 351>400 = 12 units, recheck at next meal, and call MD if multiple readings > 350 in a day.)   Insulin Pen Needle (B-D ULTRAFINE III SHORT PEN) 31G X 8 MM MISC To use as directed with insulin, four times a day. Dx E11.9, Z79.4   losartan (COZAAR) 50 MG tablet Take 1 tablet (50 mg total) by mouth daily.   Multiple Vitamin (MULTIVITAMIN) tablet Take 1 tablet by mouth daily.    pantoprazole (PROTONIX) 40 MG tablet Take 40 mg by mouth daily.   rosuvastatin (CRESTOR) 10 MG tablet Take 1 tablet (10 mg total) by mouth daily.   solifenacin (VESICARE) 10 MG tablet Take 1 tablet (10 mg total) by mouth daily.     Allergies:   Asa [aspirin], Ibuprofen,  Ciprofloxacin, and Sulfa antibiotics   Social History   Socioeconomic History   Marital status: Widowed    Spouse name: Not on file   Number of children: 3   Years of education: Not on file   Highest education level: High school graduate  Occupational History   Not on file  Tobacco Use   Smoking status: Never   Smokeless tobacco: Never  Vaping Use   Vaping Use: Never used  Substance and Sexual Activity   Alcohol use: No    Alcohol/week: 0.0 standard drinks of  alcohol   Drug use: No   Sexual activity: Not Currently  Other Topics Concern   Not on file  Social History Narrative   Used to live independently in [place]   However had significant issues with severe gastroparesis resulting in severe hypokalemia and general weakness and fall resulting in hospitalization and SNF rehab   Now (10/2017) living with her daughter in [place] until able to stabilize and regain independence.      May 2019   Patient living in assisted living, [place]   Social Determinants of Health   Financial Resource Strain: Low Risk  (01/24/2018)   Overall Financial Resource Strain (CARDIA)    Difficulty of Paying Living Expenses: Not hard at all  Food Insecurity: No Food Insecurity (01/24/2018)   Hunger Vital Sign    Worried About Running Out of Food in the Last Year: Never true    Ran Out of Food in the Last Year: Never true  Transportation Needs: No Transportation Needs (01/24/2018)   PRAPARE - Administrator, Civil Service (Medical): No    Lack of Transportation (Non-Medical): No  Physical Activity: Inactive (01/24/2018)   Exercise Vital Sign    Days of Exercise per Week: 0 days    Minutes of Exercise per Session: 0 min  Stress: No Stress Concern Present (01/24/2018)   [employer] of Occupational Health - Occupational Stress Questionnaire    Feeling of Stress : Not at all  Social Connections: Moderately Isolated (01/24/2018)   Social Connection and Isolation Panel [NHANES]    Frequency of  Communication with Friends and Family: Once a week    Frequency of Social Gatherings with Friends and Family: Once a week    Attends Religious Services: More than 4 times per year    Active Member of [employer] or Organizations: No    Attends Banker Meetings: Never    Marital Status: Widowed     Family History: The patient's family history significant for the absence of premature cardiac illness  ROS:   Please see the history of present illness.    All other systems are reviewed and are negative.   EKGs/Labs/Other Studies Reviewed:    The following studies were reviewed today: Comprehensive pacemaker interrogation including threshold testing on both atrial and ventricular leads was performed today.  EKG:  EKG is not ordered today.  The most recent tracing from 11/05/2022 shows atrial sensed (sinus), ventricular paced rhythm. Recent Labs: 01/25/2023: ALT 13; BUN 18; Creatinine, Ser 0.90; Hemoglobin 12.4; Platelets 259; Potassium 4.5; Sodium 136  11/29/2021 A1c 7.9% Recent Lipid Panel    Component Value Date/Time   CHOL 140 02/14/2021 1017   TRIG 95 02/14/2021 1017   HDL 56 02/14/2021 1017   CHOLHDL 2.5 02/14/2021 1017   LDLCALC 66 02/14/2021 1017    Physical Exam:    VS:  BP (!) 140/66 (BP Location: Left Arm, Patient Position: Sitting, Cuff Size: Normal)   Pulse 66   Ht 5' 3.5" (1.613 m)   Wt 113 lb 9.6 oz (51.5 kg)   SpO2 94%   BMI 19.81 kg/m     Wt Readings from Last 3 Encounters:  04/04/23 113 lb 9.6 oz (51.5 kg)  02/15/23 112 lb 12.8 oz (51.2 kg)  01/31/23 114 lb 6.4 oz (51.9 kg)      General: Alert, oriented x3, no distress, very lean.  Healthy pacemaker site. Head: no evidence of trauma, PERRL, EOMI, no exophtalmos or lid lag, no myxedema, no xanthelasma; normal  ears, nose and oropharynx Neck: normal jugular venous pulsations and no hepatojugular reflux; brisk carotid pulses without delay and no carotid bruits Chest: clear to auscultation, no signs  of consolidation by percussion or palpation, normal fremitus, symmetrical and full respiratory excursions Cardiovascular: normal position and quality of the apical impulse, regular rhythm, normal first and paradoxically split second heart sounds, no murmurs, rubs or gallops Abdomen: no tenderness or distention, no masses by palpation, no abnormal pulsatility or arterial bruits, normal bowel sounds, no hepatosplenomegaly Extremities: no clubbing, cyanosis or edema; 2+ radial, ulnar and brachial pulses bilaterally; 2+ right femoral, posterior tibial and dorsalis pedis pulses; 2+ left femoral, posterior tibial and dorsalis pedis pulses; no subclavian or femoral bruits Neurological: grossly nonfocal Psych: Normal mood and affect     ASSESSMENT:    No diagnosis found.    PLAN:    In order of problems listed above:  CHB: Pacemaker dependent, no escape rhythm. PPM: Her device is not capable of remote monitoring.  Estimated generator longevity decreased rather suddenly from 12 months in November to less than 6 months in February.  Still reports estimated "less than 6 months" until ERI.  Will tentatively schedule for pacemaker generator change out in early May.  Recommend implantation of an antibiotic pouch at the time of generator change out.  We have discussed the generator change out, potential complications, etc. in detail. This procedure has been fully reviewed with the patient and written informed consent has been obtained. DM: Good glycemic control.  Most recent hemoglobin A1c 7.5% which I think is acceptable at age 47.  LDL cholesterol 66 on rosuvastatin.  Continue current medication. HTN: Adequate control    Medication Adjustments/Labs and Tests Ordered: Current medicines are reviewed at length with the patient today.  Concerns regarding medicines are outlined above.  No orders of the defined types were placed in this encounter.  No orders of the defined types were placed in this  encounter.   There are no Patient Instructions on file for this visit.   Signed, Thurmon Fair, MD  04/04/2023 10:41 AM    [facility]

## 2023-04-04 NOTE — H&P (View-Only) (Signed)
Cardiology office note:    Date:  04/04/2023   ID:  Judith [name], DOB 02/02/1941, MRN [id number]  PCP:  Dameron, Marisa, DO  Cardiologist:  Anallely Rosell, MD    Referring MD: Dameron, Marisa, DO   Chief complaint: pacemaker check  History of Present Illness:    Judith [name] is a 82 y.o. female with a hx of pacemaker implantation in [place].  She has complete heart block and is pacemaker dependent.  Her device is a [employer] ultra 60 S device that is not capable of remote downloads.  She has done well in the last couple of months.  She has not had any episodes of dizziness, syncope, falls, fever or chills, bleeding, shortness of breath at rest with activity, chest pain, palpitations.    In November her pacemaker reported roughly 12 months of estimated longevity, but in February it already reported less than 6 months.  Is still reporting "less than 6 months".  The device is about 83 years old.  She has no need for atrial pacing and has good heart rate histograms but she has 100% ventricular pacing.  There have been no episodes of high ventricular rate or atrial mode switch.  Lead parameters are all excellent.  There is no sensed R wave.  Her initial pacemaker was implanted in 2007.  It appears that her ventricular lead is still the original one (Guidant 4469 implanted July 05, 2006).  June 09, 2010 she underwent a pacemaker generator change out and placement of a new atrial lead (Guidant [id number]).   She does not have a history of coronary disease.  She reports a normal stress test in the past.  She had an echocardiogram just a few months before coming to [place] and was told that her heart pumping strength is normal.  She was told that she has a murmur which is due to a mildly leaky valve.  There is no reported history of heart failure that I am aware of.   She does not have intermittent claudication and reports that lower extremity ABIs have been performed in the past with  normal results.  She has never had a stroke or TIA.  She was taking lovastatin for lipid lowering but has been off this medication for a while.  She has lost a lot of weight since then.  She was hospitalized 11/28/2021 with coledocholithiasis, had ERCP/biliary stent 11/30/2021 and lap cholecystectomy 12/06/2021,  ERCP 02/01/2022 for stent removal.  She had a fall (tripped over a rug) complicated by L1 vertebral fracture on 01/07/2023 which is being managed conservatively, with physical therapy.  She did have head impact, but no intracranial bleeding.  Imaging studies incidentally showed presence of aortic atherosclerosis, coronary atherosclerosis and mitral valve calcification.  She is recovering pretty well from the fall and is walking fairly briskly today.  Past Medical History:  Diagnosis Date   Anemia    Arthritis    Biliary gastritis    Blood transfusion without reported diagnosis    2007   Cervical spine arthritis 04/05/2016   Choledocholithiasis    Controlled type 2 diabetes mellitus with diabetic neuropathy, with long-term current use of insulin    Depression    Diabetes mellitus type 2 with complications    Dilated bile duct    Duodenal ulcer disease    Encounter for long-term (current) use of insulin    Gallstones 01/17/2018   Gastric ulcer    Gastritis and gastroduodenitis    Gastroparesis      severe   GERD (gastroesophageal reflux disease)    Gout    tested for gout but was not   Heart murmur    Hiatal hernia    Hyperlipidemia    in past not now   Hypertension    Hypokalemia, gastrointestinal losses    secondary to severe gastroparesis   Internal hemorrhoids    Long-term insulin use in type 2 diabetes 04/05/2016   Osteoporosis    just had bone density test possible    Peripheral neuropathy    Presence of cardiac pacemaker    S/P partial gastrectomy    due to severe gastric and gudoenal ulcers   Seasonal allergies    Sleep apnea    could not use CPAP   UTI  (urinary tract infection)     Past Surgical History:  Procedure Laterality Date   BILIARY DILATION  02/01/2022   Procedure: BILIARY DILATION;  Surgeon: Mansouraty, Gabriel Jr., MD;  Location: [facility];  Service: Gastroenterology;;   BILIARY DILATION  04/09/2022   Procedure: BILIARY DILATION;  Surgeon: Mansouraty, Gabriel Jr., MD;  Location: [facility];  Service: Gastroenterology;;   BILIARY STENT PLACEMENT N/A 11/30/2021   Procedure: BILIARY STENT PLACEMENT;  Surgeon: Mansouraty, Gabriel Jr., MD;  Location: [facility];  Service: Gastroenterology;  Laterality: N/A;   BILIARY STENT PLACEMENT  02/01/2022   Procedure: BILIARY STENT PLACEMENT;  Surgeon: Mansouraty, Gabriel Jr., MD;  Location: [facility];  Service: Gastroenterology;;   BIOPSY  11/30/2021   Procedure: BIOPSY;  Surgeon: Mansouraty, Gabriel Jr., MD;  Location: [facility];  Service: Gastroenterology;;   BIOPSY  04/09/2022   Procedure: BIOPSY;  Surgeon: Mansouraty, Gabriel Jr., MD;  Location: [facility];  Service: Gastroenterology;;   CHOLECYSTECTOMY N/A 12/06/2021   Procedure: LAPAROSCOPIC CHOLECYSTECTOMY, LYSIS OF ADHESIONS;  Surgeon: Cornett, Thomas, MD;  Location: [facility];  Service: General;  Laterality: N/A;   COLONOSCOPY  02/11/2018   no polyps, + small internal hemorrhoids   ENDOSCOPIC RETROGRADE CHOLANGIOPANCREATOGRAPHY (ERCP) WITH PROPOFOL N/A 11/30/2021   Procedure: ENDOSCOPIC RETROGRADE CHOLANGIOPANCREATOGRAPHY (ERCP) WITH PROPOFOL;  Surgeon: Mansouraty, Gabriel Jr., MD;  Location: [facility];  Service: Gastroenterology;  Laterality: N/A;   ENDOSCOPIC RETROGRADE CHOLANGIOPANCREATOGRAPHY (ERCP) WITH PROPOFOL N/A 02/01/2022   Procedure: ENDOSCOPIC RETROGRADE CHOLANGIOPANCREATOGRAPHY (ERCP) WITH PROPOFOL;  Surgeon: Mansouraty, Gabriel Jr., MD;  [facility];  Service: Gastroenterology;  Laterality: N/A;   ENDOSCOPIC RETROGRADE CHOLANGIOPANCREATOGRAPHY (ERCP) WITH PROPOFOL N/A 04/09/2022   Procedure: ENDOSCOPIC  RETROGRADE CHOLANGIOPANCREATOGRAPHY (ERCP) WITH PROPOFOL;  Surgeon: Mansouraty, Gabriel Jr., MD;  [facility]: [facility];  Service: Gastroenterology;  Laterality: N/A;   ESOPHAGOGASTRODUODENOSCOPY (EGD) WITH PROPOFOL N/A 11/30/2021   Procedure: ESOPHAGOGASTRODUODENOSCOPY (EGD) WITH PROPOFOL;  Surgeon: Mansouraty, Gabriel Jr., MD;  [facility];  Service: Gastroenterology;  Laterality: N/A;   EUS N/A 12/12/2017   Procedure: UPPER ENDOSCOPIC ULTRASOUND (EUS) RADIAL;  Surgeon: Jacobs, Daniel P, MD;  Location: [facility];  Service: Endoscopy;  Laterality: N/A;   GASTRECTOMY     JOINT REPLACEMENT     pace maker     PARTIAL GASTRECTOMY     REMOVAL OF STONES  02/01/2022   Procedure: REMOVAL OF STONES;  Surgeon: Mansouraty, Gabriel Jr., MD;  [facility];  Service: Gastroenterology;;   REMOVAL OF STONES  04/09/2022   Procedure: REMOVAL OF STONES;  Surgeon: Mansouraty, Gabriel Jr., MD;  [facility];  Service: Gastroenterology;;   SPHINCTEROTOMY  11/30/2021   Procedure: SPHINCTEROTOMY;  Surgeon: Mansouraty, Gabriel Jr., MD;  [facility];  Service: Gastroenterology;;   STENT REMOVAL    02/01/2022   Procedure: STENT REMOVAL;  Surgeon: Mansouraty, Gabriel Jr., MD;  [facility];  Service: Gastroenterology;;   STENT REMOVAL  04/09/2022   Procedure: STENT REMOVAL;  Surgeon: Mansouraty, Gabriel Jr., MD;  [facility];  Service: Gastroenterology;;   SUBMUCOSAL TATTOO INJECTION  11/30/2021   Procedure: SUBMUCOSAL TATTOO INJECTION;  Surgeon: Mansouraty, Gabriel Jr., MD;  Location: [facility];  Service: Gastroenterology;;   TONSILLECTOMY     45 years ago   UPPER GASTROINTESTINAL ENDOSCOPY      Current Medications: Current Meds  Medication Sig   acetaminophen (TYLENOL) 500 MG tablet Take 1 tablet (500 mg total) by mouth every 6 (six) hours as needed.   Biotin 1000 MCG tablet Take 1,000 mcg by mouth daily.    carvedilol (COREG) 25 MG tablet Take 25 mg  by mouth 2 (two) times daily with a meal.   colchicine 0.6 MG tablet Take 0.6 mg by mouth daily.   DULoxetine (CYMBALTA) 60 MG capsule Take 60 mg by mouth daily.   famotidine (PEPCID) 20 MG tablet Take 1 tablet (20 mg total) by mouth at bedtime.   fluticasone (FLONASE) 50 MCG/ACT nasal spray Place 2 sprays into both nostrils daily.   gabapentin (NEURONTIN) 100 MG capsule Take 1 capsule (100 mg) every morning and afternoon, and take 4 capsules (400mg) at bedtime (Patient taking differently: Take 100-400 mg by mouth See admin instructions. Take 1 capsule (100 mg) every morning and afternoon, and take 4 capsules (400mg) at bedtime)   insulin glargine (LANTUS) 100 UNIT/ML Solostar Pen Inject 16 Units into the skin every morning. Do not administer if glucose level is LESS THAN 80.   insulin lispro (HUMALOG KWIKPEN) 200 UNIT/ML KwikPen Inject 6-12 Units into the skin 3 (three) times daily before meals. 80-150 6 units, 151-200 7 units, 201-250 8 units, 250-299 9 units. 300-350 =10 units and > 350 = 12 units, recheck at next meal, and call MD if multiple readings > 350 in a day. (Patient taking differently: Inject 6-12 Units into the skin 3 (three) times daily before meals. 80-150 6 units, 151-200 7 units, 201-250 8 units, 251-300 9 units. 301-350 =10 units and > 351>400 = 12 units, recheck at next meal, and call MD if multiple readings > 350 in a day.)   Insulin Pen Needle (B-D ULTRAFINE III SHORT PEN) 31G X 8 MM MISC To use as directed with insulin, four times a day. Dx E11.9, Z79.4   losartan (COZAAR) 50 MG tablet Take 1 tablet (50 mg total) by mouth daily.   Multiple Vitamin (MULTIVITAMIN) tablet Take 1 tablet by mouth daily.    pantoprazole (PROTONIX) 40 MG tablet Take 40 mg by mouth daily.   rosuvastatin (CRESTOR) 10 MG tablet Take 1 tablet (10 mg total) by mouth daily.   solifenacin (VESICARE) 10 MG tablet Take 1 tablet (10 mg total) by mouth daily.     Allergies:   Asa [aspirin], Ibuprofen,  Ciprofloxacin, and Sulfa antibiotics   Social History   Socioeconomic History   Marital status: Widowed    Spouse name: Not on file   Number of children: 3   Years of education: Not on file   Highest education level: High school graduate  Occupational History   Not on file  Tobacco Use   Smoking status: Never   Smokeless tobacco: Never  Vaping Use   Vaping Use: Never used  Substance and Sexual Activity   Alcohol use: No    Alcohol/week: 0.0 standard drinks of   alcohol   Drug use: No   Sexual activity: Not Currently  Other Topics Concern   Not on file  Social History Narrative   Used to live independently in [place]   However had significant issues with severe gastroparesis resulting in severe hypokalemia and general weakness and fall resulting in hospitalization and SNF rehab   Now (10/2017) living with her daughter in [place] until able to stabilize and regain independence.      May 2019   Patient living in assisted living, [facility]   Social Determinants of Health   Financial Resource Strain: Low Risk  (01/24/2018)   Overall Financial Resource Strain (CARDIA)    Difficulty of Paying Living Expenses: Not hard at all  Food Insecurity: No Food Insecurity (01/24/2018)   Hunger Vital Sign    Worried About Running Out of Food in the Last Year: Never true    Ran Out of Food in the Last Year: Never true  Transportation Needs: No Transportation Needs (01/24/2018)   PRAPARE - Transportation    Lack of Transportation (Medical): No    Lack of Transportation (Non-Medical): No  Physical Activity: Inactive (01/24/2018)   Exercise Vital Sign    Days of Exercise per Week: 0 days    Minutes of Exercise per Session: 0 min  Stress: No Stress Concern Present (01/24/2018)   [employer] - Occupational Stress Questionnaire    Feeling of Stress : Not at all  Social Connections: Moderately Isolated (01/24/2018)   Social Connection and Isolation Panel [NHANES]    Frequency of  Communication with Friends and Family: Once a week    Frequency of Social Gatherings with Friends and Family: Once a week    Attends Religious Services: More than 4 times per year    Active Member of Clubs or Organizations: No    Attends Club or Organization Meetings: Never    Marital Status: Widowed     Family History: The patient's family history significant for the absence of premature cardiac illness  ROS:   Please see the history of present illness.    All other systems are reviewed and are negative.   EKGs/Labs/Other Studies Reviewed:    The following studies were reviewed today: Comprehensive pacemaker interrogation including threshold testing on both atrial and ventricular leads was performed today.  EKG:  EKG is not ordered today.  The most recent tracing from 11/05/2022 shows atrial sensed (sinus), ventricular paced rhythm. Recent Labs: 01/25/2023: ALT 13; BUN 18; Creatinine, Ser 0.90; Hemoglobin 12.4; Platelets 259; Potassium 4.5; Sodium 136  11/29/2021 A1c 7.9% Recent Lipid Panel    Component Value Date/Time   CHOL 140 02/14/2021 1017   TRIG 95 02/14/2021 1017   HDL 56 02/14/2021 1017   CHOLHDL 2.5 02/14/2021 1017   LDLCALC 66 02/14/2021 1017    Physical Exam:    VS:  BP (!) 140/66 (BP Location: Left Arm, Patient Position: Sitting, Cuff Size: Normal)   Pulse 66   Ht 5' 3.5" (1.613 m)   Wt 113 lb 9.6 oz (51.5 kg)   SpO2 94%   BMI 19.81 kg/m     Wt Readings from Last 3 Encounters:  04/04/23 113 lb 9.6 oz (51.5 kg)  02/15/23 112 lb 12.8 oz (51.2 kg)  01/31/23 114 lb 6.4 oz (51.9 kg)      General: Alert, oriented x3, no distress, very lean.  Healthy pacemaker site. Head: no evidence of trauma, PERRL, EOMI, no exophtalmos or lid lag, no myxedema, no xanthelasma; normal   ears, nose and oropharynx Neck: normal jugular venous pulsations and no hepatojugular reflux; brisk carotid pulses without delay and no carotid bruits Chest: clear to auscultation, no signs  of consolidation by percussion or palpation, normal fremitus, symmetrical and full respiratory excursions Cardiovascular: normal position and quality of the apical impulse, regular rhythm, normal first and paradoxically split second heart sounds, no murmurs, rubs or gallops Abdomen: no tenderness or distention, no masses by palpation, no abnormal pulsatility or arterial bruits, normal bowel sounds, no hepatosplenomegaly Extremities: no clubbing, cyanosis or edema; 2+ radial, ulnar and brachial pulses bilaterally; 2+ right femoral, posterior tibial and dorsalis pedis pulses; 2+ left femoral, posterior tibial and dorsalis pedis pulses; no subclavian or femoral bruits Neurological: grossly nonfocal Psych: Normal mood and affect     ASSESSMENT:    No diagnosis found.    PLAN:    In order of problems listed above:  CHB: Pacemaker dependent, no escape rhythm. PPM: Her device is not capable of remote monitoring.  Estimated generator longevity decreased rather suddenly from 12 months in November to less than 6 months in February.  Still reports estimated "less than 6 months" until ERI.  Will tentatively schedule for pacemaker generator change out in early May.  Recommend implantation of an antibiotic pouch at the time of generator change out.  We have discussed the generator change out, potential complications, etc. in detail. This procedure has been fully reviewed with the patient and written informed consent has been obtained. DM: Good glycemic control.  Most recent hemoglobin A1c 7.5% which I think is acceptable at age 81.  LDL cholesterol 66 on rosuvastatin.  Continue current medication. HTN: Adequate control    Medication Adjustments/Labs and Tests Ordered: Current medicines are reviewed at length with the patient today.  Concerns regarding medicines are outlined above.  No orders of the defined types were placed in this encounter.  No orders of the defined types were placed in this  encounter.   There are no Patient Instructions on file for this visit.   Signed, Darsha Zumstein, MD  04/04/2023 10:41 AM    [facility] 

## 2023-04-05 LAB — CBC
Hematocrit: 36.6 % (ref 34.0–46.6)
Hemoglobin: 12.1 g/dL (ref 11.1–15.9)
MCH: 31.8 pg (ref 26.6–33.0)
MCHC: 33.1 g/dL (ref 31.5–35.7)
MCV: 96 fL (ref 79–97)
Platelets: 218 10*3/uL (ref 150–450)
RBC: 3.81 x10E6/uL (ref 3.77–5.28)
RDW: 12.1 % (ref 11.7–15.4)
WBC: 7.8 10*3/uL (ref 3.4–10.8)

## 2023-04-05 LAB — BASIC METABOLIC PANEL
BUN/Creatinine Ratio: 16 (ref 12–28)
BUN: 14 mg/dL (ref 8–27)
CO2: 23 mmol/L (ref 20–29)
Calcium: 8.7 mg/dL (ref 8.7–10.3)
Chloride: 99 mmol/L (ref 96–106)
Creatinine, Ser: 0.9 mg/dL (ref 0.57–1.00)
Glucose: 235 mg/dL — ABNORMAL HIGH (ref 70–99)
Potassium: 4.6 mmol/L (ref 3.5–5.2)
Sodium: 136 mmol/L (ref 134–144)
eGFR: 63 mL/min/{1.73_m2} (ref >59)

## 2023-04-23 ENCOUNTER — Telehealth: Payer: Self-pay | Admitting: Cardiovascular Disease

## 2023-04-23 ENCOUNTER — Emergency Department (HOSPITAL_BASED_OUTPATIENT_CLINIC_OR_DEPARTMENT_OTHER): Payer: Medicare Other

## 2023-04-23 ENCOUNTER — Encounter (HOSPITAL_BASED_OUTPATIENT_CLINIC_OR_DEPARTMENT_OTHER): Payer: Self-pay | Admitting: Emergency Medicine

## 2023-04-23 ENCOUNTER — Other Ambulatory Visit: Payer: Self-pay

## 2023-04-23 ENCOUNTER — Emergency Department (HOSPITAL_BASED_OUTPATIENT_CLINIC_OR_DEPARTMENT_OTHER)
Admission: EM | Admit: 2023-04-23 | Discharge: 2023-04-23 | Disposition: A | Discharge status: Home / Self Care | Payer: Medicare Other | Attending: Emergency Medicine | Admitting: Emergency Medicine

## 2023-04-23 DIAGNOSIS — I10 Essential (primary) hypertension: Secondary | ICD-10-CM | POA: Diagnosis not present

## 2023-04-23 DIAGNOSIS — R109 Unspecified abdominal pain: Secondary | ICD-10-CM | POA: Diagnosis present

## 2023-04-23 DIAGNOSIS — R1012 Left upper quadrant pain: Secondary | ICD-10-CM | POA: Insufficient documentation

## 2023-04-23 DIAGNOSIS — E1142 Type 2 diabetes mellitus with diabetic polyneuropathy: Secondary | ICD-10-CM | POA: Insufficient documentation

## 2023-04-23 DIAGNOSIS — Z79899 Other long term (current) drug therapy: Secondary | ICD-10-CM | POA: Insufficient documentation

## 2023-04-23 DIAGNOSIS — Z794 Long term (current) use of insulin: Secondary | ICD-10-CM | POA: Diagnosis not present

## 2023-04-23 LAB — COMPREHENSIVE METABOLIC PANEL
ALT: 14 U/L (ref 0–44)
AST: 30 U/L (ref 15–41)
Albumin: 4.3 g/dL (ref 3.5–5.0)
Alkaline Phosphatase: 66 U/L (ref 38–126)
Anion gap: 9 (ref 5–15)
BUN: 14 mg/dL (ref 8–23)
CO2: 27 mmol/L (ref 22–32)
Calcium: 9.9 mg/dL (ref 8.9–10.3)
Chloride: 96 mmol/L — ABNORMAL LOW (ref 98–111)
Creatinine, Ser: 0.71 mg/dL (ref 0.44–1.00)
GFR, Estimated: >60 mL/min (ref >60)
Glucose, Bld: 154 mg/dL — ABNORMAL HIGH (ref 70–99)
Potassium: 4.2 mmol/L (ref 3.5–5.1)
Sodium: 132 mmol/L — ABNORMAL LOW (ref 135–145)
Total Bilirubin: 0.7 mg/dL (ref 0.3–1.2)
Total Protein: 7.5 g/dL (ref 6.5–8.1)

## 2023-04-23 LAB — CBC WITH DIFFERENTIAL/PLATELET
Abs Immature Granulocytes: 0.02 10*3/uL (ref 0.00–0.07)
Basophils Absolute: 0.1 10*3/uL (ref 0.0–0.1)
Basophils Relative: 1 %
Eosinophils Absolute: 0.5 10*3/uL (ref 0.0–0.5)
Eosinophils Relative: 4 %
HCT: 44.8 % (ref 36.0–46.0)
Hemoglobin: 14.9 g/dL (ref 12.0–15.0)
Immature Granulocytes: 0 %
Lymphocytes Relative: 18 %
Lymphs Abs: 1.9 10*3/uL (ref 0.7–4.0)
MCH: 32.3 pg (ref 26.0–34.0)
MCHC: 33.3 g/dL (ref 30.0–36.0)
MCV: 97.2 fL (ref 80.0–100.0)
Monocytes Absolute: 0.6 10*3/uL (ref 0.1–1.0)
Monocytes Relative: 5 %
Neutro Abs: 7.7 10*3/uL (ref 1.7–7.7)
Neutrophils Relative %: 72 %
Platelets: 261 10*3/uL (ref 150–400)
RBC: 4.61 MIL/uL (ref 3.87–5.11)
RDW: 13.5 % (ref 11.5–15.5)
WBC: 10.7 10*3/uL — ABNORMAL HIGH (ref 4.0–10.5)
nRBC: 0 % (ref 0.0–0.2)

## 2023-04-23 LAB — URINALYSIS, W/ REFLEX TO CULTURE (INFECTION SUSPECTED)
Bacteria, UA: NONE SEEN
Bilirubin Urine: NEGATIVE
Glucose, UA: NEGATIVE mg/dL
Hgb urine dipstick: NEGATIVE
Ketones, ur: NEGATIVE mg/dL
Leukocytes,Ua: NEGATIVE
Nitrite: NEGATIVE
Protein, ur: NEGATIVE mg/dL
Specific Gravity, Urine: 1.02 (ref 1.005–1.030)
pH: 7.5 (ref 5.0–8.0)

## 2023-04-23 LAB — LIPASE, BLOOD: Lipase: <10 U/L — ABNORMAL LOW (ref 11–51)

## 2023-04-23 LAB — CBG MONITORING, ED: Glucose-Capillary: 158 mg/dL — ABNORMAL HIGH (ref 70–99)

## 2023-04-23 MED ORDER — ONDANSETRON HCL 4 MG PO TABS: 4.0000 mg | ORAL_TABLET | Freq: Four times a day (QID) | ORAL | 0 refills | Status: DC

## 2023-04-23 MED ORDER — ONDANSETRON HCL 4 MG/2ML IJ SOLN
4.0000 mg | Freq: Once | INTRAMUSCULAR | Status: AC
  Administered 2023-04-23: 4 mg via INTRAVENOUS
  Filled 2023-04-23: qty 2

## 2023-04-23 MED ORDER — IOHEXOL 300 MG/ML  SOLN
100.0000 mL | Freq: Once | INTRAMUSCULAR | Status: AC | PRN
  Administered 2023-04-23: 75 mL via INTRAVENOUS

## 2023-04-23 MED ORDER — SODIUM CHLORIDE 0.9 % IV BOLUS
500.0000 mL | Freq: Once | INTRAVENOUS | Status: AC
  Administered 2023-04-23: 500 mL via INTRAVENOUS

## 2023-04-23 MED ORDER — MORPHINE SULFATE (PF) 4 MG/ML IV SOLN
4.0000 mg | Freq: Once | INTRAVENOUS | Status: AC
  Administered 2023-04-23: 4 mg via INTRAVENOUS
  Filled 2023-04-23: qty 1

## 2023-04-23 MED ORDER — CARVEDILOL 12.5 MG PO TABS
25.0000 mg | ORAL_TABLET | Freq: Two times a day (BID) | ORAL | Status: DC
  Administered 2023-04-23: 25 mg via ORAL
  Filled 2023-04-23: qty 2

## 2023-04-23 NOTE — Telephone Encounter (Signed)
Returned call to daughter (ok per DPR)-reviewed procedure instructions.   No further questions at this time.

## 2023-04-23 NOTE — ED Notes (Signed)
Pt able to eat and drink without feeling N/V.Marland KitchenMarland KitchenMarland Kitchen

## 2023-04-23 NOTE — ED Triage Notes (Signed)
Pt arrived via [facility] from [facility]. Per EMS, pt began feeling nauseated around noon with one episode of vomiting and as the day went on pt developed painful urination with L flank pain. Caox4 and afebrile at present per EMS.   EMS VS BP 160/84 HR 78 SpO2 97% RA CBG 190 Temp 97.4 F

## 2023-04-23 NOTE — Telephone Encounter (Signed)
Patient's daughter is requesting call back to get clarification pre-procedure instructions. She states she has received conflicting instructions. Please advise.

## 2023-04-23 NOTE — Discharge Instructions (Addendum)
Note the workup today was overall reassuring.  CT imaging was negative for any acute abnormalities.  It did show signs of stone in your common bile duct which seems to not be causing problems currently but does need close follow-up with gastroenterology outpatient.  Your laboratory studies looked well.  No evidence of urinary tract infection.  Will send you home with medicine to use as needed for nausea.  Recommend bland diet over the next few days until you are able to tolerate more complex foods.  Recommend close follow-up with primary care for reassessment of your symptoms.  Please do not hesitate to return to emergency department for worrisome signs and symptoms we discussed become apparent.

## 2023-04-23 NOTE — ED Notes (Addendum)
Pt given night dose of carvedilol due to the Pt's current BP... Provider was fine with discharging pt after meds were given... Pt informed to keep an eye on her BP for the next couple of days and notify her MD tmrw if it remains high (Pt has a appointment scheduled tmrw).... Pt and family was given discharge paperwork and understood them... Family was going to drive the Pt back to the assisted living facility.Marland KitchenMarland Kitchen

## 2023-04-23 NOTE — ED Provider Notes (Signed)
[place] EMERGENCY DEPARTMENT AT [facility] Provider Note   CSN: [id number] Arrival date & time: 04/23/23  1648     History  Chief Complaint  Patient presents with   Flank Pain    Judith [name] is a 82 y.o. female.   Flank Pain   82 year old female presents emergency department with complaints of left-sided flank pain.  Patient states that she began to feel nauseated around noon this morning with subsequent episodes of emesis.  States that nausea had persisted.  Describes some feelings of dysuria were noticed later on the afternoon along with left-sided flank pain.  Denies fever, chills, chest pain, shortness of breath, cough, congestion, hematuria, vaginal symptoms, change in bowel habits.  Last bowel movement this morning.  Has taken no medication for this.  Past medical history significant for diabetes mellitus type 2, hyperlipidemia, complete heart block with ICD placement, gastroparesis, GERD, hypertension, peripheral neuropathy, partial gastrectomy.  Abdominal surgeries include cholecystectomy, partial gastrectomy,  Home Medications Prior to Admission medications   Medication Sig Start Date End Date Taking? Authorizing Provider  ondansetron (ZOFRAN) 4 MG tablet Take 1 tablet (4 mg total) by mouth every 6 (six) hours. 04/23/23  Yes Sherian Maroon A, PA  acetaminophen (TYLENOL) 500 MG tablet Take 1 tablet (500 mg total) by mouth every 6 (six) hours as needed. 01/11/23   Dameron, Nolberto Hanlon, DO  Biotin 1000 MCG tablet Take 1,000 mcg by mouth daily.     [provider]  carvedilol (COREG) 25 MG tablet Take 25 mg by mouth 2 (two) times daily with a meal.    [provider]  cetirizine (ZYRTEC) 10 MG tablet Take 10 mg by mouth daily as needed for allergies. Patient not taking: Reported on 04/04/2023    [provider]  colchicine 0.6 MG tablet Take 0.6 mg by mouth daily.    [provider]  DULoxetine (CYMBALTA) 60 MG capsule Take 60 mg by  mouth daily.    [provider]  famotidine (PEPCID) 20 MG tablet Take 1 tablet (20 mg total) by mouth at bedtime. 06/11/19   Lezlie Lye, Meda Coffee, MD  fluticasone [facility]) 50 MCG/ACT nasal spray Place 2 sprays into both nostrils daily. 11/01/22   Dameron, Nolberto Hanlon, DO  gabapentin (NEURONTIN) 100 MG capsule Take 1 capsule (100 mg) every morning and afternoon, and take 4 capsules (400mg ) at bedtime Patient taking differently: Take 100-400 mg by mouth See admin instructions. Take 1 capsule (100 mg) every morning and afternoon, and take 4 capsules (400mg ) at bedtime 04/03/18   Lezlie Lye, Meda Coffee, MD  insulin glargine (LANTUS) 100 UNIT/ML Solostar Pen Inject 16 Units into the skin every morning. Do not administer if glucose level is LESS THAN 80. 12/14/22   Dameron, Nolberto Hanlon, DO  insulin lispro (HUMALOG KWIKPEN) 200 UNIT/ML KwikPen Inject 6-12 Units into the skin 3 (three) times daily before meals. 80-150 6 units, 151-200 7 units, 201-250 8 units, 250-299 9 units. 300-350 =10 units and > 350 = 12 units, recheck at next meal, and call MD if multiple readings > 350 in a day. Patient taking differently: Inject 6-12 Units into the skin 3 (three) times daily before meals. 80-150 6 units, 151-200 7 units, 201-250 8 units, 251-300 9 units. 301-350 =10 units and > 351>400 = 12 units, recheck at next meal, and call MD if multiple readings > 350 in a day. 11/27/22   Moses Manners, MD  Insulin Pen Needle (B-D ULTRAFINE III SHORT PEN) 31G X  8 MM MISC To use as directed with insulin, four times a day. Dx E11.9, Z79.4 11/08/17   Lezlie Lye, Meda Coffee, MD  losartan (COZAAR) 50 MG tablet Take 1 tablet (50 mg total) by mouth daily. 08/30/22   Moses Manners, MD  Multiple Vitamin (MULTIVITAMIN) tablet Take 1 tablet by mouth daily.     [provider]  pantoprazole (PROTONIX) 40 MG tablet Take 40 mg by mouth daily.    [provider]  rosuvastatin (CRESTOR) 10 MG tablet Take 1 tablet (10 mg total)  by mouth daily. 11/15/20   Just, Azalee Course, FNP  solifenacin (VESICARE) 10 MG tablet Take 1 tablet (10 mg total) by mouth daily. 08/21/22   Dameron, Nolberto Hanlon, DO      Allergies    Asa [aspirin], Ibuprofen, Ciprofloxacin, and Sulfa antibiotics    Review of Systems   Review of Systems  Genitourinary:  Positive for flank pain.    Physical Exam Updated Vital Signs BP (!) 196/84   Pulse 72   Temp 97.6 F (36.4 C) (Oral)   Resp 16   SpO2 99%  Physical Exam Vitals and nursing note reviewed.  Constitutional:      General: She is not in acute distress.    Appearance: She is well-developed.  HENT:     Head: Normocephalic and atraumatic.  Eyes:     Conjunctiva/sclera: Conjunctivae normal.  Cardiovascular:     Rate and Rhythm: Normal rate and regular rhythm.  Pulmonary:     Effort: Pulmonary effort is normal. No respiratory distress.     Breath sounds: Normal breath sounds. No wheezing, rhonchi or rales.  Abdominal:     Palpations: Abdomen is soft.     Tenderness: There is abdominal tenderness in the left upper quadrant. There is no right CVA tenderness, left CVA tenderness or guarding.  Musculoskeletal:        General: No swelling.     Cervical back: Neck supple. No rigidity or tenderness.     Right lower leg: No edema.     Left lower leg: No edema.  Skin:    General: Skin is warm and dry.     Capillary Refill: Capillary refill takes less than 2 seconds.  Neurological:     Mental Status: She is alert.  Psychiatric:        Mood and Affect: Mood normal.     ED Results / Procedures / Treatments   Labs (all labs ordered are listed, but only abnormal results are displayed) Labs Reviewed  URINALYSIS, W/ REFLEX TO CULTURE (INFECTION SUSPECTED) - Abnormal; Notable for the following components:      Result Value   Color, Urine COLORLESS (*)    All other components within normal limits  COMPREHENSIVE METABOLIC PANEL - Abnormal; Notable for the following components:   Sodium 132  (*)    Chloride 96 (*)    Glucose, Bld 154 (*)    All other components within normal limits  CBC WITH DIFFERENTIAL/PLATELET - Abnormal; Notable for the following components:   WBC 10.7 (*)    All other components within normal limits  LIPASE, BLOOD - Abnormal; Notable for the following components:   Lipase <10 (*)    All other components within normal limits  CBG MONITORING, ED - Abnormal; Notable for the following components:   Glucose-Capillary 158 (*)    All other components within normal limits    EKG EKG Interpretation  Date/Time:  Tuesday April 23 2023 17:00:08 EDT Ventricular Rate:  76 PR Interval:  296 QRS Duration: 176 QT Interval:  460 QTC Calculation: 518 R Axis:   -79 Text Interpretation: Atrial-sensed ventricular-paced rhythm No significant change since last tracing Confirmed by Elayne Snare ([id number]) on 04/23/2023 5:36:02 PM  Radiology CT ABDOMEN PELVIS W CONTRAST  Result Date: 04/23/2023 CLINICAL DATA:  Left upper quadrant/left flank pain, nausea, and vomiting. EXAM: CT ABDOMEN AND PELVIS WITH CONTRAST TECHNIQUE: Multidetector CT imaging of the abdomen and pelvis was performed using the standard protocol following bolus administration of intravenous contrast. RADIATION DOSE REDUCTION: This exam was performed according to the departmental dose-optimization program which includes automated exposure control, adjustment of the mA and/or kV according to patient size and/or use of iterative reconstruction technique. CONTRAST:  75mL OMNIPAQUE IOHEXOL 300 MG/ML  SOLN COMPARISON:  CT abdomen and pelvis 11/28/2021. CT lumbar spine 01/07/2023. FINDINGS: Lower chest: No acute abnormality. Partially visualized pacemaker leads. Mitral annular calcification. Hepatobiliary: No focal liver abnormality. Chronic intrahepatic and extrahepatic biliary dilatation with the common bile duct measuring up to 1.1 cm in diameter with a 7 mm stone not excluded in the distal common bile duct on  sagittal reformats. Pancreas: Diffuse pancreatic atrophy. No pancreatic ductal dilatation or acute inflammation. Spleen: Unremarkable. Adrenals/Urinary Tract: Unremarkable adrenal glands. Right greater than left renal scarring. No renal calculi, hydronephrosis, or suspicious mass. Unremarkable bladder. Stomach/Bowel: Postoperative changes from partial gastrectomy. Duodenal diverticulum. No evidence of bowel obstruction. Upper limits of normal caliber of the appendix which contains gas and is without surrounding inflammation to clearly indicate acute appendicitis. Vascular/Lymphatic: Abdominal aortic atherosclerosis without aneurysm. No enlarged lymph nodes. Reproductive: Uterus and bilateral adnexa are unremarkable. Other: No ascites or pneumoperitoneum. Musculoskeletal: Nonacute L1 superior endplate compression fracture with progressive, mild height loss since 01/07/2023. Moderate to severe multilevel lumbar disc degeneration. IMPRESSION: 1. No definite acute abnormality identified in the abdomen or pelvis. 2. Status post cholecystectomy with chronic intrahepatic and extrahepatic biliary dilatation and with a 7 mm stone not excluded in the distal common bile duct. 3.  Aortic Atherosclerosis (ICD10-I70.0). Electronically Signed   By: Sebastian Ache M.D.   On: 04/23/2023 18:50    Procedures Procedures    Medications Ordered in ED Medications  sodium chloride 0.9 % bolus 500 mL (500 mLs Intravenous New Bag/Given 04/23/23 1837)  ondansetron (ZOFRAN) injection 4 mg (4 mg Intravenous Given 04/23/23 1806)  morphine (PF) 4 MG/ML injection 4 mg (4 mg Intravenous Given 04/23/23 1805)  iohexol (OMNIPAQUE) 300 MG/ML solution 100 mL (75 mLs Intravenous Contrast Given 04/23/23 1812)    ED Course/ Medical Decision Making/ A&P                             Medical Decision Making Amount and/or Complexity of Data Reviewed Labs: ordered. Radiology: ordered.  Risk Prescription drug management.   This patient  presents to the ED for concern of abdominal pain, this involves an extensive number of treatment options, and is a complaint that carries with it a high risk of complications and morbidity.  The differential diagnosis includes cholecystitis, CBD pathology, hepatitis, pancreatitis, gastritis, PUD, gastroparesis, volvulus, SBO/LBO, diverticulitis, appendicitis, pyelonephritis, nephrolithiasis, cystitis, ovarian torsion   Co morbidities that complicate the patient evaluation  See HPI   Additional history obtained:  Additional history obtained from EMR External records from outside source obtained and reviewed including hospital records   Lab Tests:  I Ordered, and personally interpreted labs.  The pertinent results include: Mild leukocytosis of 10.7.  No evidence of anemia.  Platelets within range.  Mild hyponatremia and hyperchloremia of 132 and 96 respectively of which supplemented via IV fluids.  No transaminitis.  No renal dysfunction.  Lipase within normal limits.  CBG 158.  UA without any acute abnormality   Imaging Studies ordered:  I ordered imaging studies including CT abdomen pelvis I independently visualized and interpreted imaging which showed no definitive acute abnormality.  Status post cholecystectomy with chronic intrahepatic and extrahepatic biliary dilation with a 7 mm stone.  Aortic atherosclerosis. I agree with the radiologist interpretation  Cardiac Monitoring: / EKG:  The patient was maintained on a cardiac monitor.  I personally viewed and interpreted the cardiac monitored which showed an underlying rhythm of: Atrial sensed ventricular paced rhythm without obvious acute ischemic changes or prior EKG performed   Consultations Obtained:  I requested consultation with attending physician Dr. Theresia Lo who is in agreement with treatment plan going forward  Problem List / ED Course / Critical interventions / Medication management  Abdominal pain I ordered medication  including morphine, Zofran, 500 cc normal saline   Reevaluation of the patient after these medicines showed that the patient improved I have reviewed the patients home medicines and have made adjustments as needed   Social Determinants of Health:  Denies tobacco, illicit drug use   Test / Admission - Considered:  Abdominal pain Vitals signs significant for hypertension with blood pressure 196/84. Otherwise within normal range and stable throughout visit. Laboratory/imaging studies significant for: See above 82 year old female presents emergency department with complaints of abdominal pain and nausea with 1 episode of emesis.  Workup today overall reassuring.  Patient without any acute abnormality appreciated on CT abdomen pelvis but with evidence of 7 mm stone in distal common bile duct.  Patient without elevation of liver enzymes or right upper quadrant pain so low suspicion for CBD stone causing current symptoms; patient recommended follow-up with gastroenterology outpatient regarding symptoms.  Unsure exact etiology of patient's abdominal pain today.  Patient reassured by overall negative workup.  Will send home with antiemetic and recommend continue at home medication as prescribed.  Strict return precautions were discussed at length.  Patient overall well-appearing, afebrile in no acute distress, tolerating p.o. without difficulty.  Treatment plan discussed at length with patient and she acknowledged understanding was agreeable to said plan. Worrisome signs and symptoms were discussed with the patient, and the patient acknowledged understanding to return to the ED if noticed. Patient was stable upon discharge.          Final Clinical Impression(s) / ED Diagnoses Final diagnoses:  Left upper quadrant abdominal pain    Rx / DC Orders ED Discharge Orders          Ordered    ondansetron (ZOFRAN) 4 MG tablet  Every 6 hours        04/23/23 1856              Peter Garter, PA 04/23/23 1909    Elayne Snare K, DO 04/23/23 2350

## 2023-04-24 ENCOUNTER — Other Ambulatory Visit: Payer: Self-pay

## 2023-04-24 ENCOUNTER — Ambulatory Visit (INDEPENDENT_AMBULATORY_CARE_PROVIDER_SITE_OTHER): Payer: Medicare Other | Admitting: Family Medicine

## 2023-04-24 ENCOUNTER — Telehealth: Payer: Self-pay | Admitting: Internal Medicine

## 2023-04-24 VITALS — BP 108/58 | HR 81 | Ht 63.5 in | Wt 109.2 lb

## 2023-04-24 DIAGNOSIS — R1032 Left lower quadrant pain: Secondary | ICD-10-CM | POA: Diagnosis not present

## 2023-04-24 DIAGNOSIS — K838 Other specified diseases of biliary tract: Secondary | ICD-10-CM

## 2023-04-24 DIAGNOSIS — R7989 Other specified abnormal findings of blood chemistry: Secondary | ICD-10-CM

## 2023-04-24 DIAGNOSIS — R112 Nausea with vomiting, unspecified: Secondary | ICD-10-CM | POA: Diagnosis not present

## 2023-04-24 DIAGNOSIS — K8050 Calculus of bile duct without cholangitis or cholecystitis without obstruction: Secondary | ICD-10-CM

## 2023-04-24 MED ORDER — ONDANSETRON HCL 4 MG PO TABS: 4.0000 mg | ORAL_TABLET | Freq: Four times a day (QID) | ORAL | 0 refills | Status: DC

## 2023-04-24 NOTE — Telephone Encounter (Signed)
Needs MRI abd with MRCP ASAP May then need to be worked in and evaluated for ERCP MRCP 1st Thanks JMP

## 2023-04-24 NOTE — Telephone Encounter (Signed)
Patient's daughter is calling states the patient was in the hospital yesterday having severe abdominal pain and was told that she has bile duct stones. Gave patient soonest appointment I had, 5/17 with Gunnar Fusi, she states her mother will not be able to wait that long. Please advise

## 2023-04-24 NOTE — Telephone Encounter (Signed)
Order in for MR/MRCP. Message sent to Donna Christen and Leodis Rains regarding scheduling pt as she has a pacemaker.  Per radiology the pts pacemaker is not safe to scan, she cannot have the MR/MRCP. Please advise.

## 2023-04-24 NOTE — Progress Notes (Signed)
    SUBJECTIVE:   CHIEF COMPLAINT / HPI:   Abdominal Pain, Vomiting Patient developed left lower abdominal pain and vomiting 1 day ago.  Was seen in the ED yesterday for her symptoms. Had CT abdomen/pelvis which showed chronic biliary dilatation and possible common bile duct stone, otherwise no acute findings.  Normal LFTs and bili. Of note patient is s/p ERCP with biliary stent placement in the past. Also s/p cholecystectomy and partial gastrectomy. She was sent home from the ED with prescription for Zofran.  Reports today she is doing slightly better- has not vomited yet. Zofran seems to be helping. Not eating much but tolerating fluids ok. Has appointment with her GI doctor on 5/17. Having normal BMs. No fever. No chest pain or shortness of breath.  PERTINENT  PMH / PSH: T2DM, gastroparesis, complete heart block s/p pacemaker  OBJECTIVE:   BP (!) 108/58   Pulse 81   Ht 5' 3.5" (1.613 m)   Wt 109 lb 3.2 oz (49.5 kg)   SpO2 98%   BMI 19.04 kg/m   Gen: NAD, pleasant, able to participate in exam CV: RRR, normal S1/S2 Resp: Normal effort, lungs CTAB GI: normoactive bowel sounds, abdomen soft, nontender, nondistended Extremities: no edema or cyanosis Skin: warm and dry, no rashes noted Neuro: alert, no obvious focal deficits Psych: Normal affect and mood  ASSESSMENT/PLAN:   LLQ Abdominal Pain, Vomiting Acute. Symptoms possibly related to choledocholithiasis despite atypical location. Reassuringly LFTs and bili wnl. Could also be gastroparesis flare although less likely. Refill sent on Zofran 4mg  q6h prn. Follow up with GI-- looks like they are trying to arrange MRCP and expedite follow-up.    Maury Dus, MD [facility]

## 2023-04-24 NOTE — Patient Instructions (Signed)
It was great to see you!  Dr. Lauro Franklin office is aware of the situation and will reach out if they can get you in sooner.  In the meantime, continue managing your symptoms with Zofran. I have sent refills.  If you develop new fever, worsening pain, persistent vomiting despite Zofran, if you stop having bowel movements/passing gas, or any other concerns please reach out right away.  Keep an eye on your sugars and let us know if there are any major changes.  Take care, Dr Anner Crete

## 2023-04-24 NOTE — Telephone Encounter (Signed)
Please see note below. Pt was seen in the ER last night and CT scan was done. Pt is scheduled for first available appt. Is she ok to wait until that appt?  IMPRESSION: 1. No definite acute abnormality identified in the abdomen or pelvis. 2. Status post cholecystectomy with chronic intrahepatic and extrahepatic biliary dilatation and with a 7 mm stone not excluded in the distal common bile duct. 3.  Aortic Atherosclerosis (ICD10-I70.0).

## 2023-04-25 ENCOUNTER — Ambulatory Visit (HOSPITAL_COMMUNITY)
Admission: RE | Admit: 2023-04-25 | Discharge: 2023-04-25 | Disposition: A | Discharge status: Home / Self Care | Payer: Medicare Other | Attending: Cardiovascular Disease | Admitting: Cardiovascular Disease

## 2023-04-25 ENCOUNTER — Ambulatory Visit (HOSPITAL_COMMUNITY)
Admission: RE | Disposition: A | Discharge status: Home / Self Care | Payer: Self-pay | Source: Home / Self Care | Attending: Cardiovascular Disease

## 2023-04-25 DIAGNOSIS — I10 Essential (primary) hypertension: Secondary | ICD-10-CM | POA: Diagnosis not present

## 2023-04-25 DIAGNOSIS — I442 Atrioventricular block, complete: Secondary | ICD-10-CM | POA: Diagnosis not present

## 2023-04-25 DIAGNOSIS — Z794 Long term (current) use of insulin: Secondary | ICD-10-CM | POA: Insufficient documentation

## 2023-04-25 DIAGNOSIS — E119 Type 2 diabetes mellitus without complications: Secondary | ICD-10-CM | POA: Insufficient documentation

## 2023-04-25 DIAGNOSIS — Z45010 Encounter for checking and testing of cardiac pacemaker pulse generator [battery]: Secondary | ICD-10-CM

## 2023-04-25 DIAGNOSIS — Z79899 Other long term (current) drug therapy: Secondary | ICD-10-CM | POA: Insufficient documentation

## 2023-04-25 HISTORY — PX: PPM GENERATOR CHANGEOUT: EP1233

## 2023-04-25 LAB — GLUCOSE, CAPILLARY
Glucose-Capillary: 275 mg/dL — ABNORMAL HIGH (ref 70–99)
Glucose-Capillary: 259 mg/dL — ABNORMAL HIGH (ref 70–99)

## 2023-04-25 SURGERY — PPM GENERATOR CHANGEOUT

## 2023-04-25 MED ORDER — MIDAZOLAM HCL 2 MG/2ML IJ SOLN
INTRAMUSCULAR | Status: AC
  Filled 2023-04-25: qty 2

## 2023-04-25 MED ORDER — POVIDONE-IODINE 10 % EX SWAB
2.0000 | Freq: Once | CUTANEOUS | Status: AC
  Administered 2023-04-25: 2 via TOPICAL

## 2023-04-25 MED ORDER — ACETAMINOPHEN 325 MG PO TABS: 325.0000 mg | ORAL_TABLET | ORAL | Status: DC | PRN

## 2023-04-25 MED ORDER — ONDANSETRON HCL 4 MG/2ML IJ SOLN: 4.0000 mg | Freq: Four times a day (QID) | INTRAMUSCULAR | Status: DC | PRN

## 2023-04-25 MED ORDER — CEFAZOLIN SODIUM-DEXTROSE 2-4 GM/100ML-% IV SOLN
2.0000 g | INTRAVENOUS | Status: AC
  Administered 2023-04-25: 2 g via INTRAVENOUS

## 2023-04-25 MED ORDER — SODIUM CHLORIDE 0.9 % IV SOLN
INTRAVENOUS | Status: AC
  Filled 2023-04-25: qty 2

## 2023-04-25 MED ORDER — SODIUM CHLORIDE 0.9% FLUSH: 3.0000 mL | INTRAVENOUS | Status: DC | PRN

## 2023-04-25 MED ORDER — FENTANYL CITRATE (PF) 100 MCG/2ML IJ SOLN
INTRAMUSCULAR | Status: DC | PRN
  Administered 2023-04-25: 25 ug via INTRAVENOUS

## 2023-04-25 MED ORDER — SODIUM CHLORIDE 0.9% FLUSH: 3.0000 mL | Freq: Two times a day (BID) | INTRAVENOUS | Status: DC

## 2023-04-25 MED ORDER — LIDOCAINE HCL (PF) 1 % IJ SOLN
INTRAMUSCULAR | Status: DC | PRN
  Administered 2023-04-25: 50 mL

## 2023-04-25 MED ORDER — LIDOCAINE HCL (PF) 1 % IJ SOLN
INTRAMUSCULAR | Status: AC
  Filled 2023-04-25: qty 60

## 2023-04-25 MED ORDER — SODIUM CHLORIDE 0.9 % IV SOLN: INTRAVENOUS | Status: DC

## 2023-04-25 MED ORDER — FENTANYL CITRATE (PF) 100 MCG/2ML IJ SOLN
INTRAMUSCULAR | Status: AC
  Filled 2023-04-25: qty 2

## 2023-04-25 MED ORDER — SODIUM CHLORIDE 0.9 % IV SOLN: INTRAVENOUS | Status: DC | PRN

## 2023-04-25 MED ORDER — CEFAZOLIN SODIUM-DEXTROSE 2-4 GM/100ML-% IV SOLN
INTRAVENOUS | Status: AC
  Filled 2023-04-25: qty 100

## 2023-04-25 MED ORDER — SODIUM CHLORIDE 0.9 % IV SOLN
80.0000 mg | INTRAVENOUS | Status: AC
  Administered 2023-04-25: 80 mg

## 2023-04-25 MED ORDER — MIDAZOLAM HCL 5 MG/5ML IJ SOLN
INTRAMUSCULAR | Status: DC | PRN
  Administered 2023-04-25: 1 mg via INTRAVENOUS

## 2023-04-25 SURGICAL SUPPLY — 6 items
CABLE SURGICAL S-101-97-12 (CABLE) ×1 IMPLANT
PACEMAKER ACCOLADE GR (Pacemaker) IMPLANT
PAD DEFIB RADIO PHYSIO CONN (PAD) ×1 IMPLANT
POUCH AIGIS-R ANTIBACT PPM (Mesh General) ×1 IMPLANT
POUCH AIGIS-R ANTIBACT PPM MED (Mesh General) IMPLANT
TRAY PACEMAKER INSERTION (PACKS) ×1 IMPLANT

## 2023-04-25 NOTE — Op Note (Signed)
Procedure report  Procedure performed:  1. Dual chamber pacemaker generator changeout  2. Light sedation  3. TyRx pouch Reason for procedure:  1. Device generator at elective replacement interval  2. Complete heart block Procedure performed by:  Thurmon Fair, MD  Complications:  None  Estimated blood loss:  <5 mL  Medications administered during procedure:  Ancef 2 g intravenously, lidocaine 1% 30 mL locally, fentanyl 25 mcg intravenously, Versed 1 mg intravenously Device details:   [employer] Accolad MRI EL DR model number [id number], serial number [id number] Right atrial lead (chronic) [employer], model number [phone], serial [id number] (implanted 07/05/06) Right ventricular lead (chronic)  [employer], model number [phone], serial number [id number] (implanted 06/09/2010)  Explanted generator [employer] Gobles,  model number [id number], serial number  [id number] (implanted 06/09/2010)    Procedure details:  After the risks and benefits of the procedure were discussed the patient provided informed consent. She was brought to the cardiac catheter lab in the fasting state. The patient was prepped and draped in usual sterile fashion. Local anesthesia with 1% lidocaine was administered to to the left infraclavicular area. A 5-6cm horizontal incision was made parallel with and 2-3 cm caudal to the left clavicle, in the area of an old scar. An older scar was seen closer to the left clavicle. Using minimal electrocautery and mostly sharp and blunt dissection the prepectoral pocket was opened carefully to avoid injury to the loops of chronic leads. Extensive dissection was not necessary. The device was explanted. The pocket was carefully inspected for hemostasis and flushed with copious amounts of antibiotic solution.  The leads were disconnected from the old generator. The new generator connected to the chronic leads, with appropriate pacing noted. Telemetry testing of the  lead parameters later showed stable and excellent values.   The entire system was then carefully inserted in the pocket with care been taking that the leads and device assumed a comfortable position without pressure on the incision. Great care was taken that the leads be located deep to the generator. The pocket was then closed in layers using 2 layers of 2-0 Vicryl, one layer of 3-) Vicryl and cutaneous steristrips after which a sterile dressing was applied.   At the end of the procedure the following lead parameters were encountered:   Right atrial lead sensed P waves 1.2 mV, impedance 586 ohms, threshold 1.0 at 0.4 ms pulse width.  Right ventricular lead sensed R waves  none detected, impedance 655 ohms, threshold 0.4 at 0.4 ms pulse width.   Thurmon Fair, MD, [facility] [phone] office [phone] pager .mcrmd

## 2023-04-25 NOTE — Telephone Encounter (Signed)
GM, I think ERs EP is reasonable and I do not think this is urgent based on her liver enzymes being normal.  June or July for ERCP should be okay. Thank you for your help. Patty, please have the patient in to see me or an APP, I believe she already has an appointment.  If her discomfort worsens she should come back for hepatic function panel prior to that appointment Thanks JMP     JMP,  As a result of the dilated bile duct, I was concerned that at some point she could develop recurrent biliary sludge.  Interesting that her liver tests are normal.  Her Bill[place]th anatomy will make EUS difficult.  So the only way to really know would likely be doing another ERCP.  With normal liver tests the urgency of this seems less so.  But I would have some availability in June or July.  I guess it depends on how significant you think her discomfort is.  Let me know.  GM

## 2023-04-25 NOTE — Interval H&P Note (Signed)
History and Physical Interval Note:  04/25/2023 12:43 PM  Judith [name]  has presented today for surgery, with the diagnosis of Complete Heart Block.  The various methods of treatment have been discussed with the patient and family. After consideration of risks, benefits and other options for treatment, the patient has consented to  Procedure(s): PPM GENERATOR CHANGEOUT (N/A) as a surgical intervention.  The patient's history has been reviewed, patient examined, no change in status, stable for surgery.  I have reviewed the patient's chart and labs.  Questions were answered to the patient's satisfaction.     Domenic Schoenberger

## 2023-04-25 NOTE — Telephone Encounter (Signed)
JMP, Sounds good.  Judith [name], Find an ERCP date and lets get that tentatively on the books for the patient. Thanks. GM

## 2023-04-25 NOTE — Discharge Instructions (Signed)

## 2023-04-25 NOTE — Telephone Encounter (Signed)
GM, Could you please review this patient's CT scan.  This is a patient who has had upper abdominal pain but normal liver enzymes. CT scan suggested distal common bile duct stone? She has pacemaker wires which are not MRI compatible Would like your opinion regarding EUS plus minus ERCP? I can refer to [facility] or [facility] if needed based on availability; thanks for your time [facility]

## 2023-04-26 ENCOUNTER — Other Ambulatory Visit: Payer: Self-pay

## 2023-04-26 ENCOUNTER — Encounter (HOSPITAL_COMMUNITY): Payer: Self-pay | Admitting: Cardiovascular Disease

## 2023-04-26 DIAGNOSIS — K838 Other specified diseases of biliary tract: Secondary | ICD-10-CM

## 2023-04-26 MED FILL — Midazolam HCl Inj 2 MG/2ML (Base Equivalent): INTRAMUSCULAR | Qty: 1 | Status: AC

## 2023-04-26 NOTE — Telephone Encounter (Signed)
The pt daughter has been advised that ERCP has been scheduled. She will keep the appt for the pt next week to discuss.  She will call if the pt continues to have or develops worse pain.

## 2023-04-26 NOTE — Telephone Encounter (Signed)
The pt has been scheduled for an ERCP on 06/13/23 at 3 pm at [facility] with GM   Left message on machine to call back

## 2023-05-02 ENCOUNTER — Ambulatory Visit (INDEPENDENT_AMBULATORY_CARE_PROVIDER_SITE_OTHER): Payer: Medicare Other | Admitting: Student

## 2023-05-02 VITALS — BP 136/78 | HR 67 | Ht 63.5 in | Wt 114.0 lb

## 2023-05-02 DIAGNOSIS — R310 Gross hematuria: Secondary | ICD-10-CM

## 2023-05-02 DIAGNOSIS — N3001 Acute cystitis with hematuria: Secondary | ICD-10-CM

## 2023-05-02 LAB — POCT URINALYSIS DIP (MANUAL ENTRY)
Bilirubin, UA: NEGATIVE
Glucose, UA: NEGATIVE mg/dL
Ketones, POC UA: NEGATIVE mg/dL
Nitrite, UA: NEGATIVE
Protein Ur, POC: NEGATIVE mg/dL
Spec Grav, UA: 1.01 (ref 1.010–1.025)
Urobilinogen, UA: 0.2 E.U./dL
pH, UA: 6.5 (ref 5.0–8.0)

## 2023-05-02 LAB — POCT UA - MICROSCOPIC ONLY

## 2023-05-02 MED ORDER — CEFADROXIL 500 MG PO CAPS
500.0000 mg | ORAL_CAPSULE | Freq: Two times a day (BID) | ORAL | 0 refills | Status: AC
Start: 2023-05-02 — End: 2023-05-07

## 2023-05-02 NOTE — Progress Notes (Signed)
    SUBJECTIVE:   CHIEF COMPLAINT / HPI:   UTI Symptoms Coming in with dysuria, frequency, urgency.  Symptoms present for several days.  Has a history of diabetes, previously poorly controlled, tells me that she used to have frequent UTIs but they have since become much less frequent. Lives at a facility. Denies any fevers/chills/systemic symptoms. No flank pain.  Per chart review, treated with Keflex in 12/28/2022, grew proteus mirabilus. Daughter expresses hesitancy about dosing frequency and facility staff's ability to accommodate frequent dosing.    OBJECTIVE:   BP 136/78   Pulse 67   Ht 5' 3.5" (1.613 m)   Wt 114 lb (51.7 kg)   SpO2 97%   BMI 19.88 kg/m   Physical Exam Constitutional:      General: She is not in acute distress. Cardiovascular:     Rate and Rhythm: Normal rate and regular rhythm.     Pulses: Normal pulses.  Pulmonary:     Effort: Pulmonary effort is normal.  Abdominal:     Comments: + suprapubic tenderness; no flank tenderness. Remainder of abdominal exam benign  Musculoskeletal:        General: No swelling or deformity.  Skin:    General: Skin is warm and dry.  Neurological:     General: No focal deficit present.      ASSESSMENT/PLAN:   UTI (urinary tract infection) Acute cystitis with hematuria. Will treat with cefadroxil for twice daily dosing given concerns about more frequent dosing and the facility staff's ability to accommodate cephalexin. -Urine culture ordered for speciation -Cefadroxil x 5 days -Has follow-up with PCP in a few weeks, would recommend repeat UA at that time to ensure resolution of hematuria     J Dorothyann Gibbs, MD [facility] Dysuria, + frequency + urgency; is on vesicare

## 2023-05-02 NOTE — Patient Instructions (Signed)
Ms. Debock, It is so great to meet you ask MyChart I think you have a UTI.  I am going to treat you with antibiotics for the next 5 days.  If you do not get to feeling better in the next 2 to 3 days, please come back to see [place]. We will re-check your urine at your next visit with [place] to make sure you are no longer having blood in your urine.  Eliezer Mccoy, MD

## 2023-05-03 NOTE — Assessment & Plan Note (Signed)
Acute cystitis with hematuria. Will treat with cefadroxil for twice daily dosing given concerns about more frequent dosing and the facility staff's ability to accommodate cephalexin. -Urine culture ordered for speciation -Cefadroxil x 5 days -Has follow-up with PCP in a few weeks, would recommend repeat UA at that time to ensure resolution of hematuria

## 2023-05-07 ENCOUNTER — Encounter (INDEPENDENT_AMBULATORY_CARE_PROVIDER_SITE_OTHER): Payer: 59 | Admitting: Ophthalmology

## 2023-05-07 DIAGNOSIS — I10 Essential (primary) hypertension: Secondary | ICD-10-CM | POA: Diagnosis not present

## 2023-05-07 DIAGNOSIS — Z794 Long term (current) use of insulin: Secondary | ICD-10-CM

## 2023-05-07 DIAGNOSIS — E113393 Type 2 diabetes mellitus with moderate nonproliferative diabetic retinopathy without macular edema, bilateral: Secondary | ICD-10-CM | POA: Diagnosis not present

## 2023-05-07 DIAGNOSIS — H43813 Vitreous degeneration, bilateral: Secondary | ICD-10-CM

## 2023-05-07 DIAGNOSIS — H26492 Other secondary cataract, left eye: Secondary | ICD-10-CM

## 2023-05-07 DIAGNOSIS — H35033 Hypertensive retinopathy, bilateral: Secondary | ICD-10-CM | POA: Diagnosis not present

## 2023-05-08 ENCOUNTER — Ambulatory Visit: Payer: Medicare Other | Attending: Cardiovascular Disease

## 2023-05-08 DIAGNOSIS — I442 Atrioventricular block, complete: Secondary | ICD-10-CM

## 2023-05-08 LAB — CUP PACEART INCLINIC DEVICE CHECK
Implantable Lead Connection Status: 753985
Implantable Lead Model: 4469
Implantable Pulse Generator Implant Date: 20240502
Lead Channel Setting Pacing Amplitude: 3 V
Lead Channel Setting Pacing Pulse Width: 0.4 ms
Pulse Gen Serial Number: 763022

## 2023-05-08 NOTE — Patient Instructions (Addendum)
After Your Pacemaker   Monitor your pacemaker site for redness, swelling, and drainage. Call the device clinic at [phone] if you experience these symptoms or fever/chills.  Your incision was closed with Steri-strips or staples:  You may shower 7 days after your procedure and wash your incision with soap and water. Avoid lotions, ointments, or perfumes over your incision until it is well-healed.  You may use a hot tub or a pool after your wound check appointment if the incision is completely closed.  There are no restrictions in arm movement after your wound check appointment.  You may drive, unless driving has been restricted by your healthcare providers.   Remote monitoring is used to monitor your pacemaker from home. This monitoring is scheduled every 91 days by our office. It allows us to keep an eye on the functioning of your device to ensure it is working properly. You will routinely see your Electrophysiologist annually (more often if necessary).  

## 2023-05-08 NOTE — Progress Notes (Signed)
Wound check appointment. Steri-strips removed. Wound without redness or edema. Incision edges approximated, wound well healed. Normal device function. Thresholds, sensing, and impedances consistent with implant measurements. Device programmed at chronic settings. Histogram distribution appropriate for patient and level of activity. No mode switches or high ventricular rates noted. Patient educated about wound care. ROV in 3 months with implanting physician.

## 2023-05-09 LAB — CUP PACEART INCLINIC DEVICE CHECK
Date Time Interrogation Session: 20240515123025
Implantable Lead Connection Status: 753985
Implantable Lead Implant Date: 20070701
Implantable Lead Implant Date: 20110617
Implantable Lead Location: 753859
Implantable Lead Location: 753860
Implantable Lead Model: 4136
Implantable Lead Serial Number: 28708376
Implantable Lead Serial Number: 484167
Lead Channel Impedance Value: 627 Ohm
Lead Channel Impedance Value: 698 Ohm
Lead Channel Pacing Threshold Amplitude: 0.5 V
Lead Channel Pacing Threshold Amplitude: 0.6 V
Lead Channel Pacing Threshold Pulse Width: 0.4 ms
Lead Channel Pacing Threshold Pulse Width: 0.4 ms
Lead Channel Sensing Intrinsic Amplitude: 3 mV
Lead Channel Setting Pacing Amplitude: 3 V
Lead Channel Setting Sensing Sensitivity: 3.5 mV
Zone Setting Status: 755011

## 2023-05-09 NOTE — Progress Notes (Signed)
Glad to meet you, Judith [name]!  Smart phrases get away from me often, also.

## 2023-05-10 ENCOUNTER — Ambulatory Visit (INDEPENDENT_AMBULATORY_CARE_PROVIDER_SITE_OTHER): Payer: Medicare Other | Admitting: Nurse Practitioner

## 2023-05-10 ENCOUNTER — Encounter: Payer: Self-pay | Admitting: Nurse Practitioner

## 2023-05-10 ENCOUNTER — Telehealth: Payer: Self-pay

## 2023-05-10 VITALS — BP 130/76 | HR 68 | Ht 63.0 in | Wt 113.0 lb

## 2023-05-10 DIAGNOSIS — K219 Gastro-esophageal reflux disease without esophagitis: Secondary | ICD-10-CM | POA: Diagnosis not present

## 2023-05-10 DIAGNOSIS — K8050 Calculus of bile duct without cholangitis or cholecystitis without obstruction: Secondary | ICD-10-CM

## 2023-05-10 NOTE — Progress Notes (Signed)
Happy to be available.  Complex anatomy makes ERCP is difficult but will bite ensure that nothing is occurring in the bile duct system any further.  Sometimes difficulty in reaching the area can be seen even when it has been reached in the past.  Time will tell.

## 2023-05-10 NOTE — Progress Notes (Signed)
Assessment and Plan   Primary GI: Erick Blinks, MD  Brief Narrative:  82 y.o. yo female whose past medical history includes but is not necessarily limited to GERD, gastroparesis, partial gastrectomy (B-II anatomy) for PUD, gallstones and choledocholithiasis, pacemaker placement May 2024   Probable recurrent choledocholithiasis.   CT scan 04/23/23 shows chronic intra and extrahepatic biliary duct dilation and possible 7 mm distal CBD stone. She is asymptomatic with normal liver chemistries -Unable to further evaluate by MRCP given presence of pacemaker.  -Patient is scheduled to have an ERCP with Dr. Meridee Score on 6/20  History of PUD, status post partial gastrectomy (Billroth II anatomy)  GERD, asymptomatic on a.m. Protonix and famotidine at bedtime   History of Present Illness   Chief complaint:  ED follow up    Patient is s/p partial gastrectomy for PUD ( BIlroth II) for PUD. She has a history of gallstones and choledocholithiasis .  In 2022 she underwent a subtotal cholecystectomy and had a temporary drain placed.  Following that she had to ERCPs with Dr. Meridee Score.  Her last ERCP was on 04/09/2022.  Due to anatomy this was a difficult procedure.  He was able to access the biliary tree remove the biliary stent, perform sphincteroplasty and biliary stone removal.   Patient was seen in the ED on 04/23/2023 for evaluation of left mid and LLQ pain with associated nausea and vomiting.  Her sodium was low at 132 but labs otherwise unremarkable including liver tests, lipase and CBC.  However CT scan showed the following: s/p cholecystectomy with chronic intrahepatic and extrahepatic biliary dilatation and with a 7 mm stone not excluded in the distal common bile duct.  Patient's daughter called the office to discuss CT scan findings.  We tried to schedule the patient for an MRCP but this was unable to be done 2/2 to pacemaker. Case was discussed with Dr. Meridee Score,  our advance biliary  endoscopist.  Because of her Billroth anatomy an EUS would be difficult.  Therefore a repeat ERCP was recommended and she is scheduled for this on 06/13/2023.   Judy's nausea, vomiting and left-sided abdominal pain have resolved.  She feels fine.     Previous GI Endoscopies / Labs / Imaging   CTAP with contrast 04/23/2023 MPRESSION: 1. No definite acute abnormality identified in the abdomen or pelvis. 2. Status post cholecystectomy with chronic intrahepatic and extrahepatic biliary dilatation and with a 7 mm stone not excluded in the distal common bile duct. 3.  Aortic Atherosclerosis (ICD10-I70.0).     Latest Ref Rng & Units 04/23/2023    5:04 PM 01/25/2023   11:35 AM 05/14/2022    8:41 AM  Hepatic Function  Total Protein 6.5 - 8.1 g/dL 7.5  6.1  6.7   Albumin 3.5 - 5.0 g/dL 4.3  3.9  4.0   AST 15 - 41 U/L 30  20  19    ALT 0 - 44 U/L 14  13  9    Alk Phosphatase 38 - 126 U/L 66  110  70   Total Bilirubin 0.3 - 1.2 mg/dL 0.7  0.6  0.6   Bilirubin, Direct 0.0 - 0.3 mg/dL   0.2        Latest Ref Rng & Units 04/23/2023    5:04 PM 04/04/2023    1:07 PM 01/25/2023   11:35 AM  CBC  WBC 4.0 - 10.5 K/uL 10.7  7.8  8.6   Hemoglobin 12.0 - 15.0 g/dL 16.1  09.6  12.4  Hematocrit 36.0 - 46.0 % 44.8  36.6  37.8   Platelets 150 - 400 K/uL 261  218  259      Past Medical History:  Diagnosis Date   Anemia    Arthritis    Biliary gastritis    Blood transfusion without reported diagnosis    2007   Cervical spine arthritis 04/05/2016   Choledocholithiasis    Controlled type 2 diabetes mellitus with diabetic neuropathy, with long-term current use of insulin (HCC)    Depression    Diabetes mellitus type 2 with complications (HCC)    Dilated bile duct    Duodenal ulcer disease    Encounter for long-term (current) use of insulin (HCC)    Gallstones 01/17/2018   Gastric ulcer    Gastritis and gastroduodenitis    Gastroparesis    severe   GERD (gastroesophageal reflux disease)    Gout     tested for gout but was not   Heart murmur    Hiatal hernia    Hyperlipidemia    in past not now   Hypertension    Hypokalemia, gastrointestinal losses    secondary to severe gastroparesis   Internal hemorrhoids    Long-term insulin use in type 2 diabetes (HCC) 04/05/2016   Osteoporosis    just had bone density test possible    Peripheral neuropathy    Presence of cardiac pacemaker    S/P partial gastrectomy    due to severe gastric and gudoenal ulcers   Seasonal allergies    Sleep apnea    could not use CPAP   UTI (urinary tract infection)     Past Surgical History:  Procedure Laterality Date   BILIARY DILATION  02/01/2022   Procedure: BILIARY DILATION;  Surgeon: Lemar Lofty., MD;  Location: [facility] ENDOSCOPY;  Service: Gastroenterology;;   BILIARY DILATION  04/09/2022   Procedure: BILIARY DILATION;  Surgeon: Lemar Lofty., MD;  Location: [facility] ENDOSCOPY;  Service: Gastroenterology;;   BILIARY STENT PLACEMENT N/A 11/30/2021   Procedure: BILIARY STENT PLACEMENT;  Surgeon: Lemar Lofty., MD;  Location: [facility] ENDOSCOPY;  Service: Gastroenterology;  Laterality: N/A;   BILIARY STENT PLACEMENT  02/01/2022   Procedure: BILIARY STENT PLACEMENT;  Surgeon: Meridee Score Netty Starring., MD;  Location[facility] ENDOSCOPY;  Service: Gastroenterology;;   BIOPSY  11/30/2021   Procedure: BIOPSY;  Surgeon: Lemar Lofty., MD;  Location: [facility] ENDOSCOPY;  Service: Gastroenterology;;   BIOPSY  04/09/2022   Procedure: BIOPSY;  Surgeon: Lemar Lofty., MD;  Location: [facility] ENDOSCOPY;  Service: Gastroenterology;;   CHOLECYSTECTOMY N/A 12/06/2021   Procedure: LAPAROSCOPIC CHOLECYSTECTOMY, LYSIS OF ADHESIONS;  Surgeon: Harriette Bouillon, MD;  Location: [facility] ORS;  Service: General;  Laterality: N/A;   COLONOSCOPY  02/11/2018   no polyps, + small internal hemorrhoids   ENDOSCOPIC RETROGRADE CHOLANGIOPANCREATOGRAPHY (ERCP) WITH PROPOFOL N/A 11/30/2021   Procedure: ENDOSCOPIC RETROGRADE  CHOLANGIOPANCREATOGRAPHY (ERCP) WITH PROPOFOL;  Surgeon: Lemar Lofty., MD;  Location: [facility] ENDOSCOPY;  Service: Gastroenterology;  Laterality: N/A;   ENDOSCOPIC RETROGRADE CHOLANGIOPANCREATOGRAPHY (ERCP) WITH PROPOFOL N/A 02/01/2022   Procedure: ENDOSCOPIC RETROGRADE CHOLANGIOPANCREATOGRAPHY (ERCP) WITH PROPOFOL;  Surgeon: Meridee Score Netty Starring., MD;  Location: [facility] ENDOSCOPY;  Service: Gastroenterology;  Laterality: N/A;   ENDOSCOPIC RETROGRADE CHOLANGIOPANCREATOGRAPHY (ERCP) WITH PROPOFOL N/A 04/09/2022   Procedure: ENDOSCOPIC RETROGRADE CHOLANGIOPANCREATOGRAPHY (ERCP) WITH PROPOFOL;  Surgeon: Meridee Score Netty Starring., MD;  Location: [facility] ENDOSCOPY;  Service: Gastroenterology;  Laterality: N/A;   ESOPHAGOGASTRODUODENOSCOPY (EGD) WITH PROPOFOL N/A 11/30/2021   Procedure: ESOPHAGOGASTRODUODENOSCOPY (EGD) WITH PROPOFOL;  Surgeon:  Mansouraty, Netty Starring., MD;  Location: [facility] ENDOSCOPY;  Service: Gastroenterology;  Laterality: N/A;   EUS N/A 12/12/2017   Procedure: UPPER ENDOSCOPIC ULTRASOUND (EUS) RADIAL;  Surgeon: Rachael Fee, MD;  Location: [facility] ENDOSCOPY;  Service: Endoscopy;  Laterality: N/A;   GASTRECTOMY     JOINT REPLACEMENT     pace maker     PARTIAL GASTRECTOMY     PPM GENERATOR CHANGEOUT N/A 04/25/2023   Procedure: PPM GENERATOR CHANGEOUT;  Surgeon: Thurmon Fair, MD;  Location: [facility] INVASIVE CV LAB;  Service: Cardiovascular;  Laterality: N/A;   REMOVAL OF STONES  02/01/2022   Procedure: REMOVAL OF STONES;  Surgeon: Meridee Score Netty Starring., MD;  Location: [facility] ENDOSCOPY;  Service: Gastroenterology;;   REMOVAL OF STONES  04/09/2022   Procedure: REMOVAL OF STONES;  Surgeon: Lemar Lofty., MD;  Location: [facility] ENDOSCOPY;  Service: Gastroenterology;;   Dennison Mascot  11/30/2021   Procedure: Dennison Mascot;  Surgeon: Lemar Lofty., MD;  Location: [facility] ENDOSCOPY;  Service: Gastroenterology;;   Francine Graven REMOVAL  02/01/2022   Procedure: STENT REMOVAL;  Surgeon: Lemar Lofty., MD;   Location: [facility] ENDOSCOPY;  Service: Gastroenterology;;   Francine Graven REMOVAL  04/09/2022   Procedure: STENT REMOVAL;  Surgeon: Lemar Lofty., MD;  Location: [facility] ENDOSCOPY;  Service: Gastroenterology;;   SUBMUCOSAL TATTOO INJECTION  11/30/2021   Procedure: SUBMUCOSAL TATTOO INJECTION;  Surgeon: Lemar Lofty., MD;  Location: [facility] ENDOSCOPY;  Service: Gastroenterology;;   TONSILLECTOMY     45 years ago   UPPER GASTROINTESTINAL ENDOSCOPY      Current Medications, Allergies, Family History and Social History were reviewed in [employer] [facility] electronic medical record.     Current Outpatient Medications  Medication Sig Dispense Refill   acetaminophen (TYLENOL) 500 MG tablet Take 500 mg by mouth every 6 (six) hours as needed for mild pain.     Biotin 1000 MCG tablet Take 1,000 mcg by mouth daily.      carvedilol (COREG) 25 MG tablet Take 25 mg by mouth 2 (two) times daily with a meal.     cetirizine (ZYRTEC) 10 MG tablet Take 10 mg by mouth daily as needed (itching).     colchicine 0.6 MG tablet Take 0.6 mg by mouth daily.     DULoxetine (CYMBALTA) 60 MG capsule Take 60 mg by mouth daily.     famotidine (PEPCID) 20 MG tablet Take 1 tablet (20 mg total) by mouth at bedtime. 90 tablet 3   fluocinonide cream (LIDEX) 0.05 % Apply 1 Application topically daily as needed (rash). Apply to stomach     fluticasone (FLONASE) 50 MCG/ACT nasal spray Place 2 sprays into both nostrils daily. 16 g 6   gabapentin (NEURONTIN) 100 MG capsule Take 100-400 mg by mouth See admin instructions. Take 100mg  in the AM, 100mg  in the afternoon, and 400mg  at bedtime.     insulin glargine (LANTUS) 100 unit/mL SOPN Inject 16 Units into the skin daily.     insulin lispro (HUMALOG KWIKPEN) 100 UNIT/ML KwikPen Inject 0-12 Units into the skin 3 (three) times daily before meals. 80-150 6 units, 151-200 7 units, 201-250 8 units, 251-300 9 units, 301-350 =10 units, 351-400 = 12 units and recheck at next meal, and call MD if  multiple readings > 350 in a day.     Insulin Pen Needle (B-D ULTRAFINE III SHORT PEN) 31G X 8 MM MISC To use as directed with insulin, four times a day. Dx E11.9, Z79.4 200 each 5   lidocaine (LIDODERM) 5 %  Place 1 patch onto the skin daily. Apply to lower back. Remove & Discard patch within 12 hours or as directed by MD     losartan (COZAAR) 50 MG tablet Take 1 tablet (50 mg total) by mouth daily. 90 tablet 3   Multiple Vitamin (MULTIVITAMIN) tablet Take 1 tablet by mouth daily.      ondansetron (ZOFRAN) 4 MG tablet Take 1 tablet (4 mg total) by mouth every 6 (six) hours. 20 tablet 0   pantoprazole (PROTONIX) 40 MG tablet Take 40 mg by mouth daily.     rosuvastatin (CRESTOR) 10 MG tablet Take 1 tablet (10 mg total) by mouth daily. 90 tablet 3   solifenacin (VESICARE) 10 MG tablet Take 1 tablet (10 mg total) by mouth daily. 30 tablet 3   No current facility-administered medications for this visit.    Review of Systems: No chest pain. No shortness of breath. No urinary complaints.    Physical Exam  Wt Readings from Last 3 Encounters:  05/02/23 114 lb (51.7 kg)  04/25/23 109 lb (49.4 kg)  04/24/23 109 lb 3.2 oz (49.5 kg)    BP 130/76   Pulse 68   Ht 5\' 3"  (1.6 m)   Wt 113 lb (51.3 kg)   BMI 20.02 kg/m  Constitutional:  Pleasant, generally well appearing female in no acute distress. Psychiatric: Normal mood and affect. Behavior is normal. EENT: Pupils normal.  Conjunctivae are normal. No scleral icterus. Neck supple.  Cardiovascular: Normal rate, regular rhythm.  Pulmonary/chest: Effort normal and breath sounds normal. No wheezing, rales or rhonchi. Abdominal: Soft, nondistended, nontender. Bowel sounds active throughout. There are no masses palpable. No hepatomegaly. Neurological: Alert and oriented to person place and time.  Skin: Skin is warm and dry. No rashes noted.  Willette Cluster, NP  05/10/2023, 8:30 AM

## 2023-05-10 NOTE — Telephone Encounter (Signed)
Patient's daughter calls nurse line regarding patient having high blood sugar levels.   Reports that for the last 4 days blood sugar has been in the 350's-400's.   She has been taking her insulin as prescribed.   Patient reports in general she is "not feeling well."   Denies headaches, lightheadedness, nausea, vomiting or abdominal pain.   Daughter is asking if her Lantus should be increased. Patient has PCP follow up scheduled for 6/3.  Provided with ED precautions. Please advise if patient should make any medication adjustments.   Veronda Prude, RN

## 2023-05-10 NOTE — Patient Instructions (Signed)
_______________________________________________________  If your blood pressure at your visit was 140/90 or greater, please contact your primary care physician to follow up on this. _______________________________________________________  If you are age 83 or older, your body mass index should be between 23-30. Your Body mass index is 20.02 kg/m. If this is out of the aforementioned range listed, please consider follow up with your Primary Care Provider. ________________________________________________________  The [facility] providers would like to encourage you to use [facility] to communicate with providers for non-urgent requests or questions.  Due to long hold times on the telephone, sending your provider a message by [facility] may be a faster and more efficient way to get a response.  Please allow 48 business hours for a response.  Please remember that this is for non-urgent requests.  _______________________________________________________  Bonita Quin have been scheduled for an endoscopy. Please follow written instructions given to you at your visit today. If you use inhalers (even only as needed), please bring them with you on the day of your procedure.  Due to recent changes in healthcare laws, you may see the results of your imaging and laboratory studies on MyChart before your provider has had a chance to review them.  We understand that in some cases there may be results that are confusing or concerning to you. Not all laboratory results come back in the same time frame and the provider may be waiting for multiple results in order to interpret others.  Please give [place] 48 hours in order for your provider to thoroughly review all the results before contacting the office for clarification of your results.   Thank you for entrusting me with your care and choosing [facility].  Willette Cluster, NP

## 2023-05-13 NOTE — Progress Notes (Signed)
Addendum: Reviewed and agree with assessment and management plan. Barbette Mcglaun M, MD  

## 2023-05-13 NOTE — Telephone Encounter (Signed)
LVM for patient to call office and schedule an appointment with PCP earlier than 05/27/2023 or with Dr. Raymondo Band if interested if still having issues with blood sugar.  Glennie Hawk, CMA

## 2023-05-14 ENCOUNTER — Ambulatory Visit: Payer: Commercial Managed Care - HMO | Admitting: Student

## 2023-05-17 ENCOUNTER — Ambulatory Visit (INDEPENDENT_AMBULATORY_CARE_PROVIDER_SITE_OTHER): Payer: Medicare Other | Admitting: Pharmacist

## 2023-05-17 ENCOUNTER — Encounter: Payer: Self-pay | Admitting: Pharmacist

## 2023-05-17 VITALS — BP 151/53 | HR 69 | Ht 63.0 in | Wt 114.6 lb

## 2023-05-17 DIAGNOSIS — I10 Essential (primary) hypertension: Secondary | ICD-10-CM

## 2023-05-17 DIAGNOSIS — E1165 Type 2 diabetes mellitus with hyperglycemia: Secondary | ICD-10-CM

## 2023-05-17 DIAGNOSIS — Z794 Long term (current) use of insulin: Secondary | ICD-10-CM

## 2023-05-17 DIAGNOSIS — E1140 Type 2 diabetes mellitus with diabetic neuropathy, unspecified: Secondary | ICD-10-CM | POA: Diagnosis not present

## 2023-05-17 DIAGNOSIS — Z950 Presence of cardiac pacemaker: Secondary | ICD-10-CM | POA: Diagnosis not present

## 2023-05-17 MED ORDER — CARVEDILOL 12.5 MG PO TABS
12.5000 mg | ORAL_TABLET | Freq: Two times a day (BID) | ORAL | 3 refills | Status: DC
Start: 2023-05-17 — End: 2024-02-03

## 2023-05-17 MED ORDER — INSULIN LISPRO (1 UNIT DIAL) 100 UNIT/ML (KWIKPEN): 0.0000 [IU] | PEN_INJECTOR | Freq: Three times a day (TID) | SUBCUTANEOUS | 11 refills | Status: DC

## 2023-05-17 NOTE — Patient Instructions (Addendum)
It was nice to see you today!  Your goal blood sugar is 80-130 before eating and less than 180 after eating.  Medication Changes: Continue all other medications.  Adjust sliding scale insulin: 80-150 = 7 units, 151-200 = 8 units, 201-250 = 9 units, 251-300 = 10 units, 301-350 = 12 units, 351-400 = 14 units and recheck at next meal, and call MD if multiple readings > 350 in a day.  Decrease carvedilol to 12 mg twice daily. Check blood pressure at least once a week. Keep a log of your blood pressures.  Monitor blood sugars at home and keep a log (glucometer or piece of paper) to bring with you to your next visit.  Keep up the good work with diet and exercise. Aim for a diet full of vegetables, fruit and lean meats (chicken, [place], fish). Try to limit salt intake by eating fresh or frozen vegetables (instead of canned), rinse canned vegetables prior to cooking and do not add any additional salt to meals.

## 2023-05-17 NOTE — Assessment & Plan Note (Signed)
Diabetes with good control earlier in the year with A1c 7.1% in February, but with recent BG readings >300s in the afternoons and only 31% of readings in target range over the last two weeks. Patient is able to verbalize appropriate hypoglycemia management plan. Medication adherence appears good. Control is suboptimal due to requiring additional insulin. -Continued basal insulin Lantus (insulin glargine) at 16 units daily in the morning.  -Increased dose of rapid insulin  Humalog (insulin lispro) 1 unit per interval on sliding scale. New regimen:  80-150 = 7 units, 151-200 = 8 units, 201-250 = 9 units, 251-300 = 10 units, 301-350 = 12 units, 351-400 = 14 units and recheck at next meal, and call MD if multiple readings > 350 in a day. -Patient educated on purpose, proper use, and potential adverse effects of insulin.  -Plan to switch CGM from [place] 2 to [place] 3 to avoid missing glucose readings due to required swiping with phone.  Plan to send in prescription for [place] 3 sensors when daughter calls back with preferred pharmacy. -Extensively discussed pathophysiology of diabetes, recommended lifestyle interventions, dietary effects on blood sugar control.  -Counseled on s/sx of and management of hypoglycemia.  -Next A1c anticipated at next visit.

## 2023-05-17 NOTE — Assessment & Plan Note (Signed)
Hypertension: Pacemaker and dizziness daily orthostatic symptoms.  Increasing episodes of dizziness with diastolic readings 50-60 in patient with heart rate consistently 60-80 beats per minute.  Systolic blood pressure goal of <140 mmHg however may not be able to tolerate systolic < 150 give wide pulse pressure.  Medication adherence good.  Diastolic blood pressure in the 50s, resulting in increased dizziness.  Heart rate has been well control with all recent readings 60-80. -Decreased dose of carvedilol from 25 mg BID to 12.5 mg BID. -Educated patient on purpose of change in medication due to dizziness, importance of getting up slowly to avoid hypotension and falls.  -Requested increase in blood pressure monitoring at facility (5x per week as requested in writing).  -Continued losartan 50 mg daily. -Possibly consider additional adjustment with losartan or carvedilol in near future.

## 2023-05-17 NOTE — Assessment & Plan Note (Deleted)
Pacemaker and dizziness daily orthostatic symptoms.  Increasing episodes of dizziness with diastolic readings 50-60 in patient with heart rate consistently 60-80 beats per minute.  Systolic blood pressure goal of <140 mmHg however may not be able to tolerate systolic < 150 give wide pulse pressure.  Medication adherence good.  Diastolic blood pressure in the 50s, resulting in increased dizziness.  Heart rate has been well control with all recent readings 60-80. -Decreased dose of carvedilol from 25 mg BID to 12.5 mg BID. -Educated patient on purpose of change in medication due to dizziness, importance of getting up slowly to avoid hypotension and falls.  -Requested increase in blood pressure monitoring at facility (5x per week as requested in writing).  -Continued losartan 50 mg daily. -Possibly consider additional adjustment with losartan or carvedilol in near future.

## 2023-05-17 NOTE — Progress Notes (Signed)
S:     Chief Complaint  Patient presents with   Medication Management    DM follow up   82 y.o. female who presents for diabetes evaluation, education, and management.  PMH is significant for DM, HTN.  Patient and daughter requested visit for diabetes follow-up after several days of elevated BG readings >300s in the afternoons.   Today, patient arrives in good spirits and presents with Daughter, Judith [name].  Family/Social History: Lives in assisted living.  Current diabetes medications include: Lantus (insulin glargine) 16 units daily, sliding scale Humalog (insulin aspart),  Current hypertension medications include: carvedilol 25 mg BID, losartan 50 mg daily,  Current hyperlipidemia medications include: rosuvastatin 10 mg daily  Patient reports adherence to taking all medications as prescribed. Provided MAR from assisted living facility.  Do you feel that your medications are working for you? yes Have you been experiencing any side effects to the medications prescribed? no Do you have any problems obtaining medications due to transportation or finances? no Insurance coverage: [employer]  Patient reports hypoglycemic events. CGM alerts her and she called nursing assistance who provide glucose tablets.  Rarely has symptoms since using CGM.   Patient reported dietary habits: Eats 3 meals/day Patient reports eating well, often what is served at her facility. Occasional desserts (pecan pie).  Patient-reported exercise habits: participates in activities at assisted living facility   O:   Review of Systems  All other systems reviewed and are negative.   Physical Exam Constitutional:      Appearance: Normal appearance. She is normal weight.  Neurological:     Mental Status: She is alert.  Psychiatric:        Mood and Affect: Mood normal.        Behavior: Behavior normal.        Thought Content: Thought content normal.        Judgment: Judgment normal.    05/17/23  CGM Download:  (7 days of data) % Time CGM is active: 67% Average Glucose: 221 mg/dL Glucose Variability: 16.1 (goal <36%) Time in Goal:  - Time in range 70-180: 31% - Time above range: 65% - 29% high; 36% very high - Time below range: 4% - 1% low; 3% very low Elevated BG readings in the afternoons despite administration of mealtime insulin according to sliding scale.   Lab Results  Component Value Date   HGBA1C 7.1 (A) 02/15/2023   Vitals:   05/17/23 0933 05/17/23 0935  BP: (!) 143/55 (!) 151/53  Pulse: 69   SpO2: 98%     Lipid Panel     Component Value Date/Time   CHOL 140 02/14/2021 1017   TRIG 95 02/14/2021 1017   HDL 56 02/14/2021 1017   CHOLHDL 2.5 02/14/2021 1017   LDLCALC 66 02/14/2021 1017    Clinical Atherosclerotic Cardiovascular Disease (ASCVD): No  The ASCVD Risk score (Arnett DK, et al., 2019) failed to calculate for the following reasons:   The 2019 ASCVD risk score is only valid for ages 21 to 72    A/P: Diabetes with good control earlier in the year with A1c 7.1% in February, but with recent BG readings >300s in the afternoons and only 31% of readings in target range over the last two weeks. Patient is able to verbalize appropriate hypoglycemia management plan. Medication adherence appears good. Control is suboptimal due to requiring additional insulin. -Continued basal insulin Lantus (insulin glargine) at 16 units daily in the morning.  -Increased dose of rapid insulin  Humalog (insulin lispro) 1 unit per interval on sliding scale. New regimen:  80-150 = 7 units, 151-200 = 8 units, 201-250 = 9 units, 251-300 = 10 units, 301-350 = 12 units, 351-400 = 14 units and recheck at next meal, and call MD if multiple readings > 350 in a day. -Patient educated on purpose, proper use, and potential adverse effects of insulin.  -Plan to switch CGM from [place] 2 to [place] 3 to avoid missing glucose readings due to required swiping with phone.  Plan to send in prescription  for Libre 3 sensors when daughter calls back with preferred pharmacy. -Extensively discussed pathophysiology of diabetes, recommended lifestyle interventions, dietary effects on blood sugar control.  -Counseled on s/sx of and management of hypoglycemia.  -Next A1c anticipated at next visit.   ASCVD risk - primary prevention in patient with diabetes. Last LDL is 66 at goal of <70 mg/dL. -Continued rosuvastatin 10 mg.   Hypertension: Pacemaker and dizziness daily orthostatic symptoms.  Increasing episodes of dizziness with diastolic readings 50-60 in patient with heart rate consistently 60-80 beats per minute.  Systolic blood pressure goal of <140 mmHg however may not be able to tolerate systolic < 150 give wide pulse pressure.  Medication adherence good.  Diastolic blood pressure in the 50s, resulting in increased dizziness.  Heart rate has been well control with all recent readings 60-80. -Decreased dose of carvedilol from 25 mg BID to 12.5 mg BID. -Educated patient on purpose of change in medication due to dizziness, importance of getting up slowly to avoid hypotension and falls.  -Requested increase in blood pressure monitoring at facility (5x per week as requested in writing).  -Continued losartan 50 mg daily. -Possibly consider additional adjustment with losartan or carvedilol in near future.   Written patient instructions provided. Patient verbalized understanding of treatment plan.  Total time in face to face counseling 37 minutes.    Follow-up:  Pharmacist - PRN - none planned at this time.  PCP 05/27/2023 with Dr. Melissa Noon.  Please ask me for a CGM report prior to that visit.  Patient seen with Haze Boyden PharmD Candidate and Bing Plume, PharmD Candidate.

## 2023-05-18 ENCOUNTER — Telehealth: Payer: Self-pay | Admitting: Student

## 2023-05-18 ENCOUNTER — Other Ambulatory Visit: Payer: Self-pay | Admitting: Student

## 2023-05-18 DIAGNOSIS — E1140 Type 2 diabetes mellitus with diabetic neuropathy, unspecified: Secondary | ICD-10-CM

## 2023-05-18 MED ORDER — INSULIN GLARGINE 100 UNITS/ML SOLOSTAR PEN: 18.0000 [IU] | PEN_INJECTOR | Freq: Every day | SUBCUTANEOUS | 2 refills | Status: DC

## 2023-05-18 NOTE — Telephone Encounter (Signed)
**  After Hours/ Emergency Line Call**  Received a call to report that [name] has elevated CBGs up to 500 with dinner, history provided by daughter.  Lives in a facility and has not had appropriate change of SSI per Dr. Raymondo Band as the staff has not updated the change in the SSI. Fasting 250.  Recommended that increase Lantus 16U>18 or 20U given range.  Unsure how to relay this to facility as it is the weekend and the clinic is closed. Instructed daughter to assist with increasing insulin dosage and to continue monitoring amount given over the weekend. Red flags discussed.  Will forward to PCP.  Fax # to facility [phone] although unsure if I need release of information signed for [place] to release to facility.   Alfredo Martinez, MD  PGY-2, [facility] 05/18/2023 5:53 PM

## 2023-05-20 ENCOUNTER — Encounter: Payer: Self-pay | Admitting: Student

## 2023-05-20 NOTE — Progress Notes (Signed)
Reviewed and agree with Dr Koval's plan.   

## 2023-05-21 NOTE — Telephone Encounter (Signed)
Patient's daughter returns call to nurse line.   Advised that I had faxed the updated prescription over this morning.   Daughter appreciative.   Veronda Prude, RN

## 2023-05-27 ENCOUNTER — Encounter: Payer: Self-pay | Admitting: Student

## 2023-05-27 ENCOUNTER — Ambulatory Visit (INDEPENDENT_AMBULATORY_CARE_PROVIDER_SITE_OTHER): Payer: Medicare Other | Admitting: Student

## 2023-05-27 VITALS — BP 126/74 | HR 73 | Ht 63.0 in | Wt 114.4 lb

## 2023-05-27 DIAGNOSIS — I10 Essential (primary) hypertension: Secondary | ICD-10-CM | POA: Diagnosis not present

## 2023-05-27 DIAGNOSIS — Z794 Long term (current) use of insulin: Secondary | ICD-10-CM | POA: Diagnosis not present

## 2023-05-27 DIAGNOSIS — E1165 Type 2 diabetes mellitus with hyperglycemia: Secondary | ICD-10-CM

## 2023-05-27 MED ORDER — ACETAMINOPHEN 500 MG PO TABS: 500.0000 mg | ORAL_TABLET | Freq: Four times a day (QID) | ORAL | 0 refills | Status: DC | PRN

## 2023-05-27 MED ORDER — INSULIN LISPRO (1 UNIT DIAL) 100 UNIT/ML (KWIKPEN): 0.0000 [IU] | PEN_INJECTOR | Freq: Three times a day (TID) | SUBCUTANEOUS | 11 refills | Status: DC

## 2023-05-27 NOTE — Progress Notes (Deleted)
  SUBJECTIVE:   CHIEF COMPLAINT / HPI:    Tylenol 500 mg to q6h Using insuline 18 units in the skin daily  Daily blood pressure reading Increased insulin sliding scale KEEPING long acting 18 units daily in the morning  PERTINENT  PMH / PSH: ***  Past Medical History:  Diagnosis Date   Anemia    Arthritis    Biliary gastritis    Blood transfusion without reported diagnosis    2007   Cervical spine arthritis 04/05/2016   Choledocholithiasis    Controlled type 2 diabetes mellitus with diabetic neuropathy, with long-term current use of insulin (HCC)    Depression    Diabetes mellitus type 2 with complications (HCC)    Dilated bile duct    Duodenal ulcer disease    Encounter for long-term (current) use of insulin (HCC)    Gallstones 01/17/2018   Gastric ulcer    Gastritis and gastroduodenitis    Gastroparesis    severe   GERD (gastroesophageal reflux disease)    Gout    tested for gout but was not   Heart murmur    Hiatal hernia    Hyperlipidemia    in past not now   Hypertension    Hypokalemia, gastrointestinal losses    secondary to severe gastroparesis   Internal hemorrhoids    Long-term insulin use in type 2 diabetes (HCC) 04/05/2016   Osteoporosis    just had bone density test possible    Peripheral neuropathy    Presence of cardiac pacemaker    S/P partial gastrectomy    due to severe gastric and gudoenal ulcers   Seasonal allergies    Sleep apnea    could not use CPAP   UTI (urinary tract infection)     Patient Care Team: Darral Dash, DO as PCP - General (Family Medicine) Thurmon Fair, MD as PCP - Cardiology (Cardiology) Delrae Alfred, MD as Referring Physician (Surgical Oncology) Pyrtle, Carie Caddy, MD as Consulting Physician (Gastroenterology) Marlene Lard, MD as Referring Physician (Endocrinology) Croitoru, Rachelle Hora, MD as Consulting Physician (Cardiology) OBJECTIVE:  BP 126/74   Pulse 73   Ht 5\' 3"  (1.6 m)   Wt 114 lb 6.4 oz (51.9 kg)    SpO2 96%   BMI 20.27 kg/m  Physical Exam   ASSESSMENT/PLAN:  There are no diagnoses linked to this encounter. No follow-ups on file. Darral Dash, DO 05/27/2023, 2:36 PM    PGY-***, [facility] {    This will disappear when note is signed, click to select method of visit    :1}

## 2023-05-27 NOTE — Assessment & Plan Note (Signed)
BP stable and at goal. Improvement in diastolic, was symptomatic with diastolics in the 50s, now improved in the 70s. Continue carvedilol 12.5 mg twice daily Continue losartan 50 mg daily

## 2023-05-27 NOTE — Progress Notes (Signed)
SUBJECTIVE:   CHIEF COMPLAINT / HPI:   Judith [name] is an 82 year-old female here for follow-up of diabetes and hypertension.  CBGs are much improved since increasing Lantus to 18 units daily.  However, fasting CBGs still elevated, and most of time is not spent in target range per CGM.  Still has sliding scale starting at 7 units.  Daughter is here and they do not want to make any big changes right now with a long-acting insulin, but are agreeable to increasing sliding scale by 1 unit.  PERTINENT  PMH / PSH: Reviewed  Past Medical History:  Diagnosis Date   Anemia    Arthritis    Biliary gastritis    Blood transfusion without reported diagnosis    2007   Cervical spine arthritis 04/05/2016   Choledocholithiasis    Controlled type 2 diabetes mellitus with diabetic neuropathy, with long-term current use of insulin (HCC)    Depression    Diabetes mellitus type 2 with complications (HCC)    Dilated bile duct    Duodenal ulcer disease    Encounter for long-term (current) use of insulin (HCC)    Gallstones 01/17/2018   Gastric ulcer    Gastritis and gastroduodenitis    Gastroparesis    severe   GERD (gastroesophageal reflux disease)    Gout    tested for gout but was not   Heart murmur    Hiatal hernia    Hyperlipidemia    in past not now   Hypertension    Hypokalemia, gastrointestinal losses    secondary to severe gastroparesis   Internal hemorrhoids    Long-term insulin use in type 2 diabetes (HCC) 04/05/2016   Osteoporosis    just had bone density test possible    Peripheral neuropathy    Presence of cardiac pacemaker    S/P partial gastrectomy    due to severe gastric and gudoenal ulcers   Seasonal allergies    Sleep apnea    could not use CPAP   UTI (urinary tract infection)     Patient Care Team: Darral Dash, DO as PCP - General (Family Medicine) Thurmon Fair, MD as PCP - Cardiology (Cardiology) Delrae Alfred, MD as Referring Physician (Surgical  Oncology) Pyrtle, Carie Caddy, MD as Consulting Physician (Gastroenterology) Marlene Lard, MD as Referring Physician (Endocrinology) Croitoru, Rachelle Hora, MD as Consulting Physician (Cardiology) OBJECTIVE:  BP 126/74   Pulse 73   Ht 5\' 3"  (1.6 m)   Wt 114 lb 6.4 oz (51.9 kg)   SpO2 96%   BMI 20.27 kg/m  Physical Exam Constitutional:      General: She is not in acute distress.    Appearance: Normal appearance.  Eyes:     Extraocular Movements: Extraocular movements intact.     Pupils: Pupils are equal, round, and reactive to light.  Cardiovascular:     Rate and Rhythm: Normal rate and regular rhythm.  Pulmonary:     Effort: Pulmonary effort is normal.     Breath sounds: Normal breath sounds.  Skin:    General: Skin is warm and dry.  Neurological:     General: No focal deficit present.     Mental Status: She is alert.      ASSESSMENT/PLAN:  Type 2 diabetes mellitus with hyperglycemia, with long-term current use of insulin (HCC) Assessment & Plan: Continue Lantus 18 units daily Increase sliding scale with meals 3 times daily as below: Inject 0-12 Units into the skin 3 (three) times daily before meals. 80-150 =  8 units, 151-200 = 9 units, 201-250 = 10 units, 251-300 = 11 units, 301-350 = 14 units, 351-400 = 16 units and recheck at next meal, and call MD if multiple readings > 350 in a day.   A1c at next visit, was not collected today.  She is due.   Benign essential HTN Assessment & Plan: BP stable and at goal. Improvement in diastolic, was symptomatic with diastolics in the 50s, now improved in the 70s. Continue carvedilol 12.5 mg twice daily Continue losartan 50 mg daily   Other orders -     Insulin Lispro (1 Unit Dial); Inject 0-12 Units into the skin 3 (three) times daily before meals. 80-150 = 8 units, 151-200 = 9 units, 201-250 = 10 units, 251-300 = 11 units, 301-350 = 14 units, 351-400 = 16 units and recheck at next meal, and call MD if multiple readings > 350 in a  day.  Dispense: 15 mL; Refill: 11 -     Acetaminophen; Take 1 tablet (500 mg total) by mouth every 6 (six) hours as needed.  Dispense: 30 tablet; Refill: 0   No follow-ups on file. Darral Dash, DO 05/27/2023, 3:59 PM PGY-2,  Family Medicine

## 2023-05-27 NOTE — Patient Instructions (Signed)
It was great to see you! Thank you for allowing me to participate in your care!  I recommend that you always bring your medications to each appointment as this makes it easy to ensure we are on the correct medications and helps [place] not miss when refills are needed.  Our plans for today:  - Please return in 3 months for your next checkup  Take care and seek immediate care sooner if you develop any concerns.   Dr. Darral Dash, DO [facility]

## 2023-05-27 NOTE — Assessment & Plan Note (Signed)
Continue Lantus 18 units daily Increase sliding scale with meals 3 times daily as below: Inject 0-12 Units into the skin 3 (three) times daily before meals. 80-150 = 8 units, 151-200 = 9 units, 201-250 = 10 units, 251-300 = 11 units, 301-350 = 14 units, 351-400 = 16 units and recheck at next meal, and call MD if multiple readings > 350 in a day.   A1c at next visit, was not collected today.  She is due.

## 2023-06-05 ENCOUNTER — Encounter (HOSPITAL_COMMUNITY): Payer: Self-pay | Admitting: Gastroenterology

## 2023-06-13 ENCOUNTER — Ambulatory Visit (HOSPITAL_COMMUNITY): Payer: Medicare Other | Admitting: Anesthesiology

## 2023-06-13 ENCOUNTER — Ambulatory Visit (HOSPITAL_COMMUNITY): Payer: Medicare Other

## 2023-06-13 ENCOUNTER — Ambulatory Visit (HOSPITAL_BASED_OUTPATIENT_CLINIC_OR_DEPARTMENT_OTHER): Payer: Medicare Other | Admitting: Anesthesiology

## 2023-06-13 ENCOUNTER — Encounter (HOSPITAL_COMMUNITY): Payer: Self-pay | Admitting: Gastroenterology

## 2023-06-13 ENCOUNTER — Observation Stay (HOSPITAL_COMMUNITY): Payer: Medicare Other

## 2023-06-13 ENCOUNTER — Observation Stay (HOSPITAL_COMMUNITY)
Admission: RE | Admit: 2023-06-13 | Discharge: 2023-06-14 | Disposition: A | Discharge status: Home / Self Care | Payer: Medicare Other | Attending: Internal Medicine | Admitting: Internal Medicine

## 2023-06-13 ENCOUNTER — Encounter (HOSPITAL_COMMUNITY)
Admission: RE | Disposition: A | Discharge status: Home / Self Care | Payer: Self-pay | Source: Home / Self Care | Attending: Internal Medicine

## 2023-06-13 ENCOUNTER — Other Ambulatory Visit: Payer: Self-pay

## 2023-06-13 DIAGNOSIS — E1140 Type 2 diabetes mellitus with diabetic neuropathy, unspecified: Secondary | ICD-10-CM | POA: Insufficient documentation

## 2023-06-13 DIAGNOSIS — K8050 Calculus of bile duct without cholangitis or cholecystitis without obstruction: Secondary | ICD-10-CM | POA: Diagnosis present

## 2023-06-13 DIAGNOSIS — Z950 Presence of cardiac pacemaker: Secondary | ICD-10-CM | POA: Diagnosis not present

## 2023-06-13 DIAGNOSIS — Z79899 Other long term (current) drug therapy: Secondary | ICD-10-CM | POA: Diagnosis not present

## 2023-06-13 DIAGNOSIS — K838 Other specified diseases of biliary tract: Secondary | ICD-10-CM

## 2023-06-13 DIAGNOSIS — K5080 Crohn's disease of both small and large intestine without complications: Secondary | ICD-10-CM | POA: Diagnosis present

## 2023-06-13 DIAGNOSIS — G4733 Obstructive sleep apnea (adult) (pediatric): Secondary | ICD-10-CM

## 2023-06-13 DIAGNOSIS — Z980 Intestinal bypass and anastomosis status: Secondary | ICD-10-CM | POA: Insufficient documentation

## 2023-06-13 DIAGNOSIS — K449 Diaphragmatic hernia without obstruction or gangrene: Secondary | ICD-10-CM | POA: Insufficient documentation

## 2023-06-13 DIAGNOSIS — R109 Unspecified abdominal pain: Secondary | ICD-10-CM

## 2023-06-13 DIAGNOSIS — R935 Abnormal findings on diagnostic imaging of other abdominal regions, including retroperitoneum: Secondary | ICD-10-CM | POA: Diagnosis not present

## 2023-06-13 DIAGNOSIS — I10 Essential (primary) hypertension: Secondary | ICD-10-CM | POA: Insufficient documentation

## 2023-06-13 DIAGNOSIS — Z794 Long term (current) use of insulin: Secondary | ICD-10-CM | POA: Insufficient documentation

## 2023-06-13 DIAGNOSIS — Z7952 Long term (current) use of systemic steroids: Secondary | ICD-10-CM | POA: Insufficient documentation

## 2023-06-13 DIAGNOSIS — K2289 Other specified disease of esophagus: Secondary | ICD-10-CM | POA: Diagnosis not present

## 2023-06-13 DIAGNOSIS — Z9989 Dependence on other enabling machines and devices: Secondary | ICD-10-CM

## 2023-06-13 HISTORY — DX: Unspecified abdominal pain: R10.9

## 2023-06-13 HISTORY — PX: BILIARY DILATION: SHX6850

## 2023-06-13 HISTORY — PX: REMOVAL OF STONES: SHX5545

## 2023-06-13 HISTORY — PX: ENDOSCOPIC RETROGRADE CHOLANGIOPANCREATOGRAPHY (ERCP) WITH PROPOFOL: SHX5810

## 2023-06-13 LAB — CBC
HCT: 40.1 % (ref 36.0–46.0)
Hemoglobin: 12.7 g/dL (ref 12.0–15.0)
MCH: 31.6 pg (ref 26.0–34.0)
MCHC: 31.7 g/dL (ref 30.0–36.0)
MCV: 99.8 fL (ref 80.0–100.0)
Platelets: 238 10*3/uL (ref 150–400)
RBC: 4.02 MIL/uL (ref 3.87–5.11)
RDW: 13.5 % (ref 11.5–15.5)
WBC: 17.8 10*3/uL — ABNORMAL HIGH (ref 4.0–10.5)
nRBC: 0 % (ref 0.0–0.2)

## 2023-06-13 LAB — COMPREHENSIVE METABOLIC PANEL
ALT: 26 U/L (ref 0–44)
AST: 104 U/L — ABNORMAL HIGH (ref 15–41)
Albumin: 4.1 g/dL (ref 3.5–5.0)
Alkaline Phosphatase: 59 U/L (ref 38–126)
Anion gap: 12 (ref 5–15)
BUN: 18 mg/dL (ref 8–23)
CO2: 25 mmol/L (ref 22–32)
Calcium: 9.2 mg/dL (ref 8.9–10.3)
Chloride: 98 mmol/L (ref 98–111)
Creatinine, Ser: 0.74 mg/dL (ref 0.44–1.00)
GFR, Estimated: >60 mL/min (ref >60)
Glucose, Bld: 321 mg/dL — ABNORMAL HIGH (ref 70–99)
Potassium: 4.1 mmol/L (ref 3.5–5.1)
Sodium: 135 mmol/L (ref 135–145)
Total Bilirubin: 1.3 mg/dL — ABNORMAL HIGH (ref 0.3–1.2)
Total Protein: 7.3 g/dL (ref 6.5–8.1)

## 2023-06-13 LAB — GLUCOSE, CAPILLARY
Glucose-Capillary: 242 mg/dL — ABNORMAL HIGH (ref 70–99)
Glucose-Capillary: 288 mg/dL — ABNORMAL HIGH (ref 70–99)
Glucose-Capillary: 309 mg/dL — ABNORMAL HIGH (ref 70–99)
Glucose-Capillary: 275 mg/dL — ABNORMAL HIGH (ref 70–99)
Glucose-Capillary: 226 mg/dL — ABNORMAL HIGH (ref 70–99)

## 2023-06-13 LAB — LIPASE, BLOOD: Lipase: 22 U/L (ref 11–51)

## 2023-06-13 LAB — AMYLASE: Amylase: 19 U/L — ABNORMAL LOW (ref 28–100)

## 2023-06-13 SURGERY — ENDOSCOPIC RETROGRADE CHOLANGIOPANCREATOGRAPHY (ERCP) WITH PROPOFOL
Anesthesia: General

## 2023-06-13 MED ORDER — FENTANYL CITRATE PF 50 MCG/ML IJ SOSY
PREFILLED_SYRINGE | INTRAMUSCULAR | Status: AC
  Filled 2023-06-13: qty 1

## 2023-06-13 MED ORDER — INSULIN ASPART 100 UNIT/ML IJ SOLN: 4.0000 [IU] | Freq: Once | INTRAMUSCULAR | Status: AC

## 2023-06-13 MED ORDER — PROPOFOL 10 MG/ML IV BOLUS
INTRAVENOUS | Status: AC
  Filled 2023-06-13: qty 20

## 2023-06-13 MED ORDER — HYDROMORPHONE HCL 1 MG/ML IJ SOLN
1.0000 mg | INTRAMUSCULAR | Status: DC | PRN
  Administered 2023-06-14: 1 mg via INTRAVENOUS
  Filled 2023-06-13: qty 1

## 2023-06-13 MED ORDER — GLUCAGON HCL RDNA (DIAGNOSTIC) 1 MG IJ SOLR
INTRAMUSCULAR | Status: DC | PRN
  Administered 2023-06-13 (×3): .25 mg via INTRAVENOUS

## 2023-06-13 MED ORDER — INSULIN ASPART 100 UNIT/ML IJ SOLN
0.0000 [IU] | Freq: Three times a day (TID) | INTRAMUSCULAR | Status: DC
  Administered 2023-06-14: 3 [IU] via SUBCUTANEOUS
  Administered 2023-06-14: 11 [IU] via SUBCUTANEOUS

## 2023-06-13 MED ORDER — DEXAMETHASONE SODIUM PHOSPHATE 10 MG/ML IJ SOLN
INTRAMUSCULAR | Status: DC | PRN
  Administered 2023-06-13: 4 mg via INTRAVENOUS

## 2023-06-13 MED ORDER — CARVEDILOL 12.5 MG PO TABS
12.5000 mg | ORAL_TABLET | Freq: Two times a day (BID) | ORAL | Status: DC
  Administered 2023-06-14: 12.5 mg via ORAL
  Filled 2023-06-13: qty 1

## 2023-06-13 MED ORDER — FENTANYL CITRATE (PF) 100 MCG/2ML IJ SOLN: 25.0000 ug | Freq: Once | INTRAMUSCULAR | Status: DC

## 2023-06-13 MED ORDER — HYDRALAZINE HCL 20 MG/ML IJ SOLN: 10.0000 mg | INTRAMUSCULAR | Status: DC | PRN

## 2023-06-13 MED ORDER — IOHEXOL 300 MG/ML  SOLN
100.0000 mL | Freq: Once | INTRAMUSCULAR | Status: AC | PRN
  Administered 2023-06-13: 80 mL via INTRAVENOUS

## 2023-06-13 MED ORDER — GABAPENTIN 100 MG PO CAPS
100.0000 mg | ORAL_CAPSULE | Freq: Two times a day (BID) | ORAL | Status: DC
  Administered 2023-06-14: 100 mg via ORAL
  Filled 2023-06-13: qty 1

## 2023-06-13 MED ORDER — DULOXETINE HCL 60 MG PO CPEP
60.0000 mg | ORAL_CAPSULE | Freq: Every day | ORAL | Status: DC
  Administered 2023-06-14: 60 mg via ORAL
  Filled 2023-06-13: qty 1

## 2023-06-13 MED ORDER — INSULIN ASPART 100 UNIT/ML IJ SOLN
INTRAMUSCULAR | Status: AC
  Administered 2023-06-13: 4 [IU] via SUBCUTANEOUS
  Filled 2023-06-13: qty 1

## 2023-06-13 MED ORDER — INSULIN ASPART 100 UNIT/ML IJ SOLN
INTRAMUSCULAR | Status: AC
  Filled 2023-06-13: qty 1

## 2023-06-13 MED ORDER — FENTANYL CITRATE PF 50 MCG/ML IJ SOSY
PREFILLED_SYRINGE | INTRAMUSCULAR | Status: AC
  Administered 2023-06-13: 25 ug
  Filled 2023-06-13: qty 1

## 2023-06-13 MED ORDER — PANTOPRAZOLE SODIUM 40 MG IV SOLR
40.0000 mg | INTRAVENOUS | Status: DC
  Administered 2023-06-13: 40 mg via INTRAVENOUS
  Filled 2023-06-13: qty 10

## 2023-06-13 MED ORDER — LOSARTAN POTASSIUM 50 MG PO TABS
50.0000 mg | ORAL_TABLET | Freq: Every day | ORAL | Status: DC
  Administered 2023-06-14: 50 mg via ORAL
  Filled 2023-06-13: qty 1

## 2023-06-13 MED ORDER — OXYCODONE HCL 5 MG PO TABS
5.0000 mg | ORAL_TABLET | ORAL | Status: DC | PRN
  Administered 2023-06-13: 5 mg via ORAL
  Filled 2023-06-13: qty 1

## 2023-06-13 MED ORDER — ACETAMINOPHEN 325 MG PO TABS
650.0000 mg | ORAL_TABLET | ORAL | Status: DC
  Administered 2023-06-13 – 2023-06-14 (×4): 650 mg via ORAL
  Filled 2023-06-13 (×4): qty 2

## 2023-06-13 MED ORDER — INSULIN GLARGINE 100 UNITS/ML SOLOSTAR PEN: 18.0000 [IU] | PEN_INJECTOR | Freq: Every day | SUBCUTANEOUS | Status: DC

## 2023-06-13 MED ORDER — DICLOFENAC SUPPOSITORY 100 MG
RECTAL | Status: AC
  Filled 2023-06-13: qty 1

## 2023-06-13 MED ORDER — FENTANYL CITRATE (PF) 100 MCG/2ML IJ SOLN
INTRAMUSCULAR | Status: DC | PRN
  Administered 2023-06-13 (×2): 50 ug via INTRAVENOUS

## 2023-06-13 MED ORDER — DICLOFENAC SUPPOSITORY 100 MG
RECTAL | Status: DC | PRN
  Administered 2023-06-13: 100 mg via RECTAL

## 2023-06-13 MED ORDER — LACTATED RINGERS IV SOLN: INTRAVENOUS | Status: AC

## 2023-06-13 MED ORDER — ALUM & MAG HYDROXIDE-SIMETH 200-200-20 MG/5ML PO SUSP
30.0000 mL | Freq: Once | ORAL | Status: AC
  Administered 2023-06-13: 30 mL via ORAL
  Filled 2023-06-13: qty 30

## 2023-06-13 MED ORDER — ENOXAPARIN SODIUM 40 MG/0.4ML IJ SOSY
40.0000 mg | PREFILLED_SYRINGE | INTRAMUSCULAR | Status: DC
  Administered 2023-06-14: 40 mg via SUBCUTANEOUS
  Filled 2023-06-13: qty 0.4

## 2023-06-13 MED ORDER — PANTOPRAZOLE SODIUM 40 MG PO TBEC: 40.0000 mg | DELAYED_RELEASE_TABLET | Freq: Two times a day (BID) | ORAL | 6 refills | Status: DC

## 2023-06-13 MED ORDER — FENTANYL CITRATE (PF) 100 MCG/2ML IJ SOLN
25.0000 ug | Freq: Once | INTRAMUSCULAR | Status: AC
  Administered 2023-06-13: 25 ug via INTRAVENOUS

## 2023-06-13 MED ORDER — SODIUM CHLORIDE 0.9 % IV SOLN
3.0000 g | INTRAVENOUS | Status: DC
  Filled 2023-06-13: qty 8

## 2023-06-13 MED ORDER — SODIUM CHLORIDE 0.9 % IV SOLN
INTRAVENOUS | Status: DC | PRN
  Administered 2023-06-13: 40 mL

## 2023-06-13 MED ORDER — FENTANYL CITRATE (PF) 100 MCG/2ML IJ SOLN
INTRAMUSCULAR | Status: AC
  Filled 2023-06-13: qty 2

## 2023-06-13 MED ORDER — LACTATED RINGERS IV BOLUS
500.0000 mL | Freq: Once | INTRAVENOUS | Status: AC
  Administered 2023-06-13: 500 mL via INTRAVENOUS

## 2023-06-13 MED ORDER — DICYCLOMINE HCL 10 MG PO CAPS
10.0000 mg | ORAL_CAPSULE | Freq: Once | ORAL | Status: AC
  Administered 2023-06-13: 10 mg via ORAL
  Filled 2023-06-13: qty 1

## 2023-06-13 MED ORDER — LABETALOL HCL 5 MG/ML IV SOLN
20.0000 mg | INTRAVENOUS | Status: DC | PRN
  Administered 2023-06-13: 20 mg via INTRAVENOUS

## 2023-06-13 MED ORDER — INSULIN ASPART 100 UNIT/ML IJ SOLN
6.0000 [IU] | Freq: Once | INTRAMUSCULAR | Status: AC
  Administered 2023-06-13: 6 [IU] via SUBCUTANEOUS

## 2023-06-13 MED ORDER — AMOXICILLIN-POT CLAVULANATE 875-125 MG PO TABS: 1.0000 | ORAL_TABLET | Freq: Two times a day (BID) | ORAL | 0 refills | Status: DC

## 2023-06-13 MED ORDER — GLUCAGON HCL RDNA (DIAGNOSTIC) 1 MG IJ SOLR
INTRAMUSCULAR | Status: AC
  Filled 2023-06-13: qty 2

## 2023-06-13 MED ORDER — SODIUM CHLORIDE 0.9% FLUSH
3.0000 mL | Freq: Two times a day (BID) | INTRAVENOUS | Status: DC
  Administered 2023-06-13: 3 mL via INTRAVENOUS

## 2023-06-13 MED ORDER — ONDANSETRON HCL 4 MG PO TABS
4.0000 mg | ORAL_TABLET | Freq: Four times a day (QID) | ORAL | Status: DC | PRN
  Administered 2023-06-13: 4 mg via ORAL
  Filled 2023-06-13: qty 1

## 2023-06-13 MED ORDER — LACTATED RINGERS IV SOLN: INTRAVENOUS | Status: DC

## 2023-06-13 MED ORDER — GABAPENTIN 100 MG PO CAPS
400.0000 mg | ORAL_CAPSULE | Freq: Every day | ORAL | Status: DC
  Administered 2023-06-13: 400 mg via ORAL
  Filled 2023-06-13: qty 4

## 2023-06-13 MED ORDER — SODIUM CHLORIDE 0.9 % IV SOLN: INTRAVENOUS | Status: DC

## 2023-06-13 MED ORDER — LABETALOL HCL 5 MG/ML IV SOLN
INTRAVENOUS | Status: AC
  Filled 2023-06-13: qty 4

## 2023-06-13 MED ORDER — FESOTERODINE FUMARATE ER 4 MG PO TB24
4.0000 mg | ORAL_TABLET | Freq: Every day | ORAL | Status: DC
  Administered 2023-06-14: 4 mg via ORAL
  Filled 2023-06-13: qty 1

## 2023-06-13 MED ORDER — PHENYLEPHRINE HCL (PRESSORS) 10 MG/ML IV SOLN
INTRAVENOUS | Status: DC | PRN
  Administered 2023-06-13: 100 ug via INTRAVENOUS

## 2023-06-13 MED ORDER — SODIUM CHLORIDE 0.9 % IV SOLN
3.0000 g | Freq: Four times a day (QID) | INTRAVENOUS | Status: DC
  Administered 2023-06-13 – 2023-06-14 (×4): 3 g via INTRAVENOUS
  Filled 2023-06-13 (×6): qty 8

## 2023-06-13 MED ORDER — ROCURONIUM BROMIDE 100 MG/10ML IV SOLN
INTRAVENOUS | Status: DC | PRN
  Administered 2023-06-13: 50 mg via INTRAVENOUS
  Administered 2023-06-13: 20 mg via INTRAVENOUS

## 2023-06-13 MED ORDER — ROSUVASTATIN CALCIUM 10 MG PO TABS
10.0000 mg | ORAL_TABLET | Freq: Every day | ORAL | Status: DC
  Administered 2023-06-14: 10 mg via ORAL
  Filled 2023-06-13: qty 1

## 2023-06-13 MED ORDER — CIPROFLOXACIN IN D5W 400 MG/200ML IV SOLN
INTRAVENOUS | Status: AC
  Filled 2023-06-13: qty 200

## 2023-06-13 MED ORDER — PROPOFOL 10 MG/ML IV BOLUS
INTRAVENOUS | Status: DC | PRN
  Administered 2023-06-13: 100 mg via INTRAVENOUS

## 2023-06-13 MED ORDER — SUGAMMADEX SODIUM 200 MG/2ML IV SOLN
INTRAVENOUS | Status: DC | PRN
  Administered 2023-06-13: 200 mg via INTRAVENOUS

## 2023-06-13 MED ORDER — INSULIN GLARGINE-YFGN 100 UNIT/ML ~~LOC~~ SOLN
18.0000 [IU] | Freq: Every day | SUBCUTANEOUS | Status: DC
  Administered 2023-06-13: 18 [IU] via SUBCUTANEOUS
  Filled 2023-06-13 (×2): qty 0.18

## 2023-06-13 MED ORDER — LIDOCAINE HCL (CARDIAC) PF 100 MG/5ML IV SOSY
PREFILLED_SYRINGE | INTRAVENOUS | Status: DC | PRN
  Administered 2023-06-13: 80 mg via INTRAVENOUS

## 2023-06-13 MED ORDER — INSULIN ASPART 100 UNIT/ML IJ SOLN
0.0000 [IU] | Freq: Every day | INTRAMUSCULAR | Status: DC
  Administered 2023-06-13: 2 [IU] via SUBCUTANEOUS

## 2023-06-13 NOTE — Progress Notes (Addendum)
Patient evaluated in post-procedure recovery area. As noted in the ERCP note, this procedure was extremely complex (as it has been in the past) but we were able to extract the biliary stone that was present. She is having pain post-procedure in the midepigastrium. Rated as dull and it is 5/10 currently. It is now nearly 600PM, since her procedure was so complex and we completed this after 500PM. We are going to get a CXR/KUB to rule out perforation. If no evidence of perforation, then will treat for possible pancreatitis and give IVF and pain control. She will need admission for observation at this point in time of the day, as it is not clear that she will be able to go back to her facility. I have sent message to [facility] to evaluate for admission. Should imaging show concerning findings, then she will need cross-sectional imaging to be performed and surgical consultation. I hope this is not the case, but the complexity of her procedure/enteroscopy/ERCP is well documented as in the past and we need to be vigilant. We will give her Fentanyl bolus at this time to see how she does. I have updated patient's daughter. Will await imaging and [facility] discussion.  Corliss Parish, MD [facility] Office # [phone]   630 Addendum CXR and KUB negative for acute abnormality. Patient received first dose of Fentanyl and will receive a 2nd dose soon. Labs ordered. EKG ordered. Discussed case with TR[facility] and patient will come in for post-procedure pain control - query Pancreatitis vs less-likely now, perforation. If pain cannot be controlled over the course of the evening/early morning, then recommend Urgent CT-CAP to rule out perforation that may not be totally visible. CT with IV/Water soluble contrast. I'm putting her on clears for now. I have updated the overnight GI team and placed her on our list for evaluation/followup with our Inpatient GI team tomorrow. Greatly  appreciate Medical service for admitting this patient. I have updated patient's daughter as well.  Corliss Parish, MD [facility] Office # [phone]   700PM Addendum, [facility] team was actually at [facility] not [facility].  They were supposed to reach out approximately half hour ago to find out who would admit at [facility].  I have sent a second message to [facility] to try to get someone to come to admit the patient. Blood sugar was 288.  I am giving give her 6 units of aspart NovoLog and then her blood sugar can be rechecked 20 minutes from now IV fluids have been ordered and so she is going to be given a bolus of 500 cc LR and then given 75 mL/h for 12 hours continuous follow-up.  Medicine service can certainly adjust this as they see fit. Patient's discomfort is going into her back.  Although we did not see evidence of any perforation, we will get a give her another dose of fentanyl.  If her pain is persisting, then we will move forward with ordering CT scan. Awaiting [facility] for admission.   Corliss Parish, MD [facility] Office # [phone]   [phone] Addendum Patient abdominal pain better. Thoracic back pain present but not lumbar or lower thoracic. 1 more dose of IV Fentanyl. TR[facility] Admission in PACU to evaluate, greatly appreciate help. If issues progress overnight, then she should get CT scan. Please update [facility] if abnormal imaging is found. She'll be seen in followup tomorrow.  Corliss Parish, MD [facility] Office # [phone]

## 2023-06-13 NOTE — Anesthesia Postprocedure Evaluation (Signed)
Anesthesia Post Note  Patient: Judith [name]  Procedure(s) Performed: ENDOSCOPIC RETROGRADE CHOLANGIOPANCREATOGRAPHY (ERCP) WITH PROPOFOL REMOVAL OF STONES BILIARY DILATION     Patient location during evaluation: PACU Anesthesia Type: General Level of consciousness: awake and alert and oriented Pain management: pain level controlled Vital Signs Assessment: post-procedure vital signs reviewed and stable Respiratory status: spontaneous breathing, nonlabored ventilation and respiratory function stable Cardiovascular status: blood pressure returned to baseline and stable Postop Assessment: no apparent nausea or vomiting Anesthetic complications: no   No notable events documented.  Last Vitals:  Vitals:   06/13/23 1750 06/13/23 1755  BP: (!) 137/105   Pulse: 71 73  Resp: 12   Temp:    SpO2: 96% 96%    Last Pain:  Vitals:   06/13/23 1740  TempSrc: Oral  PainSc:                  Arneta Mahmood A.

## 2023-06-13 NOTE — Op Note (Signed)
[facility] Patient Name: Judith [name] Procedure Date: 06/13/2023 MRN: [id number] Attending MD: Corliss Parish , MD, [id number] Date of Birth: 1941/08/08 CSN: [id number] Age: 82 Admit Type: Outpatient Procedure:                ERCP Indications:              Bile duct stone(s), Abnormal abdominal CT Providers:                Corliss Parish, MD, Fransisca Connors, Salley Scarlet, Technician Referring MD:             Carie Caddy. Pyrtle, MD, Darral Dash Medicines:                General Anesthesia, Unasyn 1.5 g IV, Diclofenac 100                            mg rectal, Glucagon 0.75 mg IV Complications:            No immediate complications. Estimated Blood Loss:     Estimated blood loss was minimal. Procedure:                Pre-Anesthesia Assessment:                           - Prior to the procedure, a History and Physical                            was performed, and patient medications and                            allergies were reviewed. The patient's tolerance of                            previous anesthesia was also reviewed. The risks                            and benefits of the procedure and the sedation                            options and risks were discussed with the patient.                            All questions were answered, and informed consent                            was obtained. Prior Anticoagulants: The patient has                            taken no anticoagulant or antiplatelet agents                            except for NSAID medication. ASA Grade Assessment:  III - A patient with severe systemic disease. After                            reviewing the risks and benefits, the patient was                            deemed in satisfactory condition to undergo the                            procedure.                           After obtaining informed consent, the scope was                             passed under direct vision. Throughout the                            procedure, the patient's blood pressure, pulse, and                            oxygen saturations were monitored continuously. The                            PCF-HQ190L ([id number]) Olympus colonoscope was                            introduced through the mouth, and used to inject                            contrast into and used to locate the major papilla.                            The TJF-Q190V ([id number]) Olympus duodenoscope was                            introduced through the mouth, and used to inject                            contrast into and used to inject contrast into the                            bile duct. The ERCP was determined to be ASGE                            Difficulty Grade 3 due to post-surgical anatomy,                            inadequate patient positioning and significant                            looping. Successful completion of the procedure was  aided by performing the maneuvers documented                            (below) in this report. The patient tolerated the                            procedure. This procedure took nearly 2 hours. Scope In: Scope Out: Findings:      A scout film of the abdomen was obtained. Surgical clips, consistent       with a previous cholecystectomy, were seen in the area of the right       upper quadrant of the abdomen.      The esophagus was successfully intubated under direct vision without       detailed examination of the pharynx, larynx, and associated structures.       A pediatric colonoscope scope was used for the examination of the upper       gastrointestinal tract. The scope was passed under direct vision through       the upper GI tract. No gross lesions were noted in the entire esophagus.       The Z-line was irregular and was found 35 cm from the incisors. A 1 cm       hiatal hernia was present.  Evidence of a patent Billroth II       gastrojejunostomy was found. The gastrojejunal anastomosis was       characterized by healthy appearing mucosa. This was traversed. The       efferent limb was examined and was characterized by healthy appearing       mucosa. The afferent limb was examined and was characterized by healthy       appearing mucosa. I could visualize a previously placed tattoo to help       with demarcating the efferent limb. The examined jejunum was normal in       appearance. The examined duodenum was normal, including the retroflexed       pylorus. A biliary fistulotomy had been performed. It appeared open.       Getting to this area was quite difficult. It required [place] to begin an       ERCP prone position, transition to the left lateral position, transition       to supine position, transition back to prone position, going to a left       lateral prone position with pressure in the mid abdomen.      A short 0.035 inch Soft Jagwire was passed into the biliary tree. The       Hydratome sphincterotome was passed over the guidewire and the bile duct       was then deeply cannulated. Contrast was injected. I personally       interpreted the bile duct images. Ductal flow of contrast was adequate.       Image quality was adequate. Contrast extended to the hepatic ducts.       Opacification of the entire biliary tree except for the gallbladder was       successful. The maximum diameter of the ducts was 14 mm. The middle       third of the main bile duct contained filling defect thought to be       sludge. Dilation of the common bile duct with an 08-01-09 mm x 4 cm CRE  balloon (to a maximum balloon size of 10 mm) dilator was successful as a       sphincteroplasty to improve biliary drainage. To discover objects, the       biliary tree was swept with a retrieval balloon. Sludge was swept from       the duct. One stone was removed. No stones remained. An occlusion        cholangiogram was performed that showed no further significant biliary       pathology.      A pancreatogram was not performed.      The duodenoscope was withdrawn from the patient. Impression:               - No gross lesions in the entire esophagus. Z-line                            irregular, 35 cm from the incisors.                           - 1 cm hiatal hernia.                           - Patent Billroth II gastrojejunostomy was found,                            characterized by healthy appearing mucosa.                           - Normal examined efferent and efferent jejunum.                           - Normal examined duodenum.                           - Extreme difficulty with trying to access this                            area due to patient's previous surgery as well as                            angulations in her bowel.                           - Prior biliary sphincterotomy/fistulotomy appeared                            open.                           - The fluoroscopic examination was suspicious for                            sludge.                           - Choledocholithiasis and biliary sludge was found.  Complete removal was accomplished by balloon                            sphincteroplasty and balloon sweeping. Moderate Sedation:      Not Applicable - Patient had care per Anesthesia. Recommendation:           - The patient will be observed post-procedure,                            until all discharge criteria are met.                           - Discharge patient to home.                           - Patient has a contact number available for                            emergencies. The signs and symptoms of potential                            delayed complications were discussed with the                            patient. Return to normal activities tomorrow.                            Written discharge instructions were provided  to the                            patient.                           - Low fat diet.                           - Watch for pancreatitis, bleeding, perforation,                            and cholangitis.                           - Augmentin 875 mg twice daily for 3 days to                            decrease post interventional infectious                            complications.                           - Increase PPI to 40 mg twice daily for 1 month.                            Then may decrease back to daily.                           -  Repeat hepatic function panel with primary GI in                            2 to 4 weeks.                           - The findings and recommendations were discussed                            with the patient.                           - The findings and recommendations were discussed                            with the patient's family. Procedure Code(s):        --- Professional ---                           [phone], 59, Endoscopic retrograde                            cholangiopancreatography (ERCP); with                            trans-endoscopic balloon dilation of                            biliary/pancreatic duct(s) or of ampulla                            (sphincteroplasty), including sphincterotomy, when                            performed, each duct                           43264, Endoscopic retrograde                            cholangiopancreatography (ERCP); with removal of                            calculi/debris from biliary/pancreatic duct(s)                           74328, 26, Endoscopic catheterization of the                            biliary ductal system, radiological supervision and                            interpretation Diagnosis Code(s):        --- Professional ---                           K22.89, Other specified disease of esophagus  K44.9, Diaphragmatic hernia without obstruction or                             gangrene                           Z98.0, Intestinal bypass and anastomosis status                           K80.50, Calculus of bile duct without cholangitis                            or cholecystitis without obstruction                           R93.5, Abnormal findings on diagnostic imaging of                            other abdominal regions, including retroperitoneum CPT copyright 2022 [employer]. All rights reserved. The codes documented in this report are preliminary and upon coder review may  be revised to meet current compliance requirements. Corliss Parish, MD 06/13/2023 5:23:43 PM Number of Addenda: 0

## 2023-06-13 NOTE — Assessment & Plan Note (Addendum)
Post procedural, lipase within normal limits.  X-ray and abdomen and chest nonactionable.  At this time vitally stable.  I will check a CT chest abdomen pelvis with contrast.  Check troponin, pending EKG.  CBC and white count seem to be elevated.  Blood cultures will be sent.  Patient has been continued on perioperative Unasyn.  I have ordered patient for oxycodone and Dilaudid as needed for moderate to severe pain.  I will also order standing acetaminophen

## 2023-06-13 NOTE — Anesthesia Preprocedure Evaluation (Addendum)
Anesthesia Evaluation  Patient identified by MRN, date of birth, ID band Patient awake    Reviewed: Allergy & Precautions, NPO status , Patient's Chart, lab work & pertinent test results  History of Anesthesia Complications (+) DIFFICULT AIRWAY and history of anesthetic complications  Airway Mallampati: II  TM Distance: >3 FB Neck ROM: Full    Dental  (+) Chipped, Missing, Dental Advisory Given   Pulmonary sleep apnea and Continuous Positive Airway Pressure Ventilation    breath sounds clear to auscultation       Cardiovascular hypertension, Pt. on medications + pacemaker + Valvular Problems/Murmurs  Rhythm:Regular Rate:Normal  EKG 12/01/2021 Atrial sensed, ventricular paced rhythm   Neuro/Psych  PSYCHIATRIC DISORDERS  Depression    Peripheral neuropathy  Neuromuscular disease    GI/Hepatic hiatal hernia, PUD,GERD  Medicated and Controlled,,Elevated LFT's Choledocholithiasis   Endo/Other  diabetes, Well Controlled, Type 2, Insulin Dependent    Renal/GU negative Renal ROS  negative genitourinary   Musculoskeletal  (+) Arthritis , Osteoarthritis,  C-spine OA   Abdominal   Peds  Hematology  (+) Blood dyscrasia, anemia   Anesthesia Other Findings   Reproductive/Obstetrics                             Anesthesia Physical Anesthesia Plan  ASA: 3  Anesthesia Plan: General   Post-op Pain Management: Minimal or no pain anticipated   Induction: Intravenous, Rapid sequence and Cricoid pressure planned  PONV Risk Score and Plan: 3 and Treatment may vary due to age or medical condition, Ondansetron and Dexamethasone  Airway Management Planned: Oral ETT and Video Laryngoscope Planned  Additional Equipment: None  Intra-op Plan:   Post-operative Plan: Extubation in OR  Informed Consent: I have reviewed the patients History and Physical, chart, labs and discussed the procedure including the  risks, benefits and alternatives for the proposed anesthesia with the patient or authorized representative who has indicated his/her understanding and acceptance.     Dental advisory given  Plan Discussed with: CRNA  Anesthesia Plan Comments:         Anesthesia Quick Evaluation

## 2023-06-13 NOTE — Anesthesia Procedure Notes (Signed)
Procedure Name: Intubation Date/Time: 06/13/2023 3:18 PM  Performed by: Johnette Abraham, CRNAPre-anesthesia Checklist: Patient identified, Emergency Drugs available, Suction available and Patient being monitored Patient Re-evaluated:Patient Re-evaluated prior to induction Oxygen Delivery Method: Circle System Utilized Preoxygenation: Pre-oxygenation with 100% oxygen Induction Type: IV induction Ventilation: Mask ventilation without difficulty and Unable to mask ventilate Laryngoscope Size: Glidescope and 3 Grade View: Grade II Tube type: Oral Tube size: 7.0 mm Number of attempts: 1 Airway Equipment and Method: Stylet and Oral airway Placement Confirmation: ETT inserted through vocal cords under direct vision, positive ETCO2 and breath sounds checked- equal and bilateral Secured at: 21 cm Tube secured with: Tape Dental Injury: Teeth and Oropharynx as per pre-operative assessment

## 2023-06-13 NOTE — Discharge Instructions (Signed)
YOU HAD AN ENDOSCOPIC PROCEDURE TODAY: Refer to the procedure report and other information in the discharge instructions given to you for any specific questions about what was found during the examination. If this information does not answer your questions, please call [place] office at [phone] to clarify.  ° °YOU SHOULD EXPECT: Some feelings of bloating in the abdomen. Passage of more gas than usual. Walking can help get rid of the air that was put into your GI tract during the procedure and reduce the bloating. If you had a lower endoscopy (such as a colonoscopy or flexible sigmoidoscopy) you may notice spotting of blood in your stool or on the toilet paper. Some abdominal soreness may be present for a day or two, also. ° °DIET: Your first meal following the procedure should be a light meal and then it is ok to progress to your normal diet. A half-sandwich or bowl of soup is an example of a good first meal. Heavy or fried foods are harder to digest and may make you feel nauseous or bloated. Drink plenty of fluids but you should avoid alcoholic beverages for 24 hours. If you had a esophageal dilation, please see attached instructions for diet.   ° °ACTIVITY: Your care partner should take you home directly after the procedure. You should plan to take it easy, moving slowly for the rest of the day. You can resume normal activity the day after the procedure however YOU SHOULD NOT DRIVE, use power tools, machinery or perform tasks that involve climbing or major physical exertion for 24 hours (because of the sedation medicines used during the test).  ° °SYMPTOMS TO REPORT IMMEDIATELY: °A gastroenterologist can be reached at any hour. Please call [phone]  for any of the following symptoms:  °Following lower endoscopy (colonoscopy, flexible sigmoidoscopy) °Excessive amounts of blood in the stool  °Significant tenderness, worsening of abdominal pains  °Swelling of the abdomen that is new, acute  °Fever of 100° or  higher  °Following upper endoscopy (EGD, EUS, ERCP, esophageal dilation) °Vomiting of blood or coffee ground material  °New, significant abdominal pain  °New, significant chest pain or pain under the shoulder blades  °Painful or persistently difficult swallowing  °New shortness of breath  °Black, tarry-looking or red, bloody stools ° °FOLLOW UP:  °If any biopsies were taken you will be contacted by phone or by letter within the next 1-3 weeks. Call [phone]  if you have not heard about the biopsies in 3 weeks.  °Please also call with any specific questions about appointments or follow up tests. ° °

## 2023-06-13 NOTE — H&P (Signed)
GASTROENTEROLOGY PROCEDURE H&P NOTE   Primary Care Physician: Darral Dash, DO  HPI: Judith [name] is a 82 y.o. female who presents for ERCP for concern of recurrent choledocholithiasis in history of Bilroth 2 anatomy (very difficult ERCPs as previously documented) but cannot get MRI/MRCP.  Past Medical History:  Diagnosis Date   Anemia    Arthritis    Biliary gastritis    Blood transfusion without reported diagnosis    2007   Cervical spine arthritis 04/05/2016   Choledocholithiasis    Controlled type 2 diabetes mellitus with diabetic neuropathy, with long-term current use of insulin (HCC)    Depression    Diabetes mellitus type 2 with complications (HCC)    Dilated bile duct    Duodenal ulcer disease    Encounter for long-term (current) use of insulin (HCC)    Gallstones 01/17/2018   Gastric ulcer    Gastritis and gastroduodenitis    Gastroparesis    severe   GERD (gastroesophageal reflux disease)    Gout    tested for gout but was not   Heart murmur    Hiatal hernia    Hyperlipidemia    in past not now   Hypertension    Hypokalemia, gastrointestinal losses    secondary to severe gastroparesis   Internal hemorrhoids    Long-term insulin use in type 2 diabetes (HCC) 04/05/2016   Osteoporosis    just had bone density test possible    Peripheral neuropathy    Presence of cardiac pacemaker    S/P partial gastrectomy    due to severe gastric and gudoenal ulcers   Seasonal allergies    Sleep apnea    could not use CPAP   UTI (urinary tract infection)    Past Surgical History:  Procedure Laterality Date   BILIARY DILATION  02/01/2022   Procedure: BILIARY DILATION;  Surgeon: Lemar Lofty., MD;  Location: [facility] ENDOSCOPY;  Service: Gastroenterology;;   BILIARY DILATION  04/09/2022   Procedure: BILIARY DILATION;  Surgeon: Lemar Lofty., MD;  Location: [facility] ENDOSCOPY;  Service: Gastroenterology;;   BILIARY STENT PLACEMENT N/A 11/30/2021   Procedure:  BILIARY STENT PLACEMENT;  Surgeon: Lemar Lofty., MD;  Location: [facility] ENDOSCOPY;  Service: Gastroenterology;  Laterality: N/A;   BILIARY STENT PLACEMENT  02/01/2022   Procedure: BILIARY STENT PLACEMENT;  Surgeon: Meridee Score Netty Starring., MD;  Location: [facility] ENDOSCOPY;  Service: Gastroenterology;;   BIOPSY  11/30/2021   Procedure: BIOPSY;  Surgeon: Lemar Lofty., MD;  Location: [facility] ENDOSCOPY;  Service: Gastroenterology;;   BIOPSY  04/09/2022   Procedure: BIOPSY;  Surgeon: Lemar Lofty., MD;  Location: [facility] ENDOSCOPY;  Service: Gastroenterology;;   CHOLECYSTECTOMY N/A 12/06/2021   Procedure: LAPAROSCOPIC CHOLECYSTECTOMY, LYSIS OF ADHESIONS;  Surgeon: Harriette Bouillon, MD;  Location: [facility] ORS;  Service: General;  Laterality: N/A;   COLONOSCOPY  02/11/2018   no polyps, + small internal hemorrhoids   ENDOSCOPIC RETROGRADE CHOLANGIOPANCREATOGRAPHY (ERCP) WITH PROPOFOL N/A 11/30/2021   Procedure: ENDOSCOPIC RETROGRADE CHOLANGIOPANCREATOGRAPHY (ERCP) WITH PROPOFOL;  Surgeon: Lemar Lofty., MD;  Location: [facility] ENDOSCOPY;  Service: Gastroenterology;  Laterality: N/A;   ENDOSCOPIC RETROGRADE CHOLANGIOPANCREATOGRAPHY (ERCP) WITH PROPOFOL N/A 02/01/2022   Procedure: ENDOSCOPIC RETROGRADE CHOLANGIOPANCREATOGRAPHY (ERCP) WITH PROPOFOL;  Surgeon: Meridee Score Netty Starring., MD;  Location: [facility] ENDOSCOPY;  Service: Gastroenterology;  Laterality: N/A;   ENDOSCOPIC RETROGRADE CHOLANGIOPANCREATOGRAPHY (ERCP) WITH PROPOFOL N/A 04/09/2022   Procedure: ENDOSCOPIC RETROGRADE CHOLANGIOPANCREATOGRAPHY (ERCP) WITH PROPOFOL;  Surgeon: Meridee Score Netty Starring., MD;  Location: [facility] ENDOSCOPY;  Service: Gastroenterology;  Laterality: N/A;   ESOPHAGOGASTRODUODENOSCOPY (EGD) WITH PROPOFOL N/A 11/30/2021   Procedure: ESOPHAGOGASTRODUODENOSCOPY (EGD) WITH PROPOFOL;  Surgeon: Meridee Score Netty Starring., MD;  Location: [facility] ENDOSCOPY;  Service: Gastroenterology;  Laterality: N/A;   EUS N/A 12/12/2017   Procedure: UPPER  ENDOSCOPIC ULTRASOUND (EUS) RADIAL;  Surgeon: Rachael Fee, MD;  Location: [facility] ENDOSCOPY;  Service: Endoscopy;  Laterality: N/A;   GASTRECTOMY     JOINT REPLACEMENT     pace maker     PARTIAL GASTRECTOMY     PPM GENERATOR CHANGEOUT N/A 04/25/2023   Procedure: PPM GENERATOR CHANGEOUT;  Surgeon: Thurmon Fair, MD;  Location: [facility] INVASIVE CV LAB;  Service: Cardiovascular;  Laterality: N/A;   REMOVAL OF STONES  02/01/2022   Procedure: REMOVAL OF STONES;  Surgeon: Mansouraty, Netty Starring., MD;  Location: [facility] ENDOSCOPY;  Service: Gastroenterology;;   REMOVAL OF STONES  04/09/2022   Procedure: REMOVAL OF STONES;  Surgeon: Lemar Lofty., MD;  Location: [facility] ENDOSCOPY;  Service: Gastroenterology;;   Dennison Mascot  11/30/2021   Procedure: Dennison Mascot;  Surgeon: Mansouraty, Netty Starring., MD;  Location: [facility] ENDOSCOPY;  Service: Gastroenterology;;   Francine Graven REMOVAL  02/01/2022   Procedure: STENT REMOVAL;  Surgeon: Lemar Lofty., MD;  Location: [facility] ENDOSCOPY;  Service: Gastroenterology;;   Francine Graven REMOVAL  04/09/2022   Procedure: STENT REMOVAL;  Surgeon: Lemar Lofty., MD;  Location: [facility] ENDOSCOPY;  Service: Gastroenterology;;   SUBMUCOSAL TATTOO INJECTION  11/30/2021   Procedure: SUBMUCOSAL TATTOO INJECTION;  Surgeon: Lemar Lofty., MD;  Location: [facility] ENDOSCOPY;  Service: Gastroenterology;;   TONSILLECTOMY     45 years ago   UPPER GASTROINTESTINAL ENDOSCOPY     Current Facility-Administered Medications  Medication Dose Route Frequency Provider Last Rate Last Admin   0.9 %  sodium chloride infusion   Intravenous Continuous Mansouraty, Netty Starring., MD       lactated ringers infusion   Intravenous Continuous Mansouraty, Netty Starring., MD 10 mL/hr at 06/13/23 1322 New Bag at 06/13/23 1322    Current Facility-Administered Medications:    0.9 %  sodium chloride infusion, , Intravenous, Continuous, Mansouraty, Netty Starring., MD   lactated ringers infusion, , Intravenous, Continuous,  Mansouraty, Netty Starring., MD, Last Rate: 10 mL/hr at 06/13/23 1322, New Bag at 06/13/23 1322 Allergies  Allergen Reactions   Asa [Aspirin] Other (See Comments)    Causes ulcers    Ibuprofen Other (See Comments)    GI   Ciprofloxacin Rash    Able to tolerate eye drops - not on nursing home records   Sulfa Antibiotics Other (See Comments)    GI issues   Family History  Problem Relation Age of Onset   Diabetes Mother    Diabetes Father    Heart disease Father    Diabetes Sister    Heart disease Son    Colon cancer Neg Hx    Colon polyps Neg Hx    Rectal cancer Neg Hx    Stomach cancer Neg Hx    Esophageal cancer Neg Hx    Social History   Socioeconomic History   Marital status: Widowed    Spouse name: Not on file   Number of children: 3   Years of education: Not on file   Highest education level: High school graduate  Occupational History   Not on file  Tobacco Use   Smoking status: Never   Smokeless tobacco: Never  Vaping Use   Vaping Use: Never used  Substance and Sexual Activity   Alcohol use: No  Alcohol/week: 0.0 standard drinks of alcohol   Drug use: No   Sexual activity: Not Currently  Other Topics Concern   Not on file  Social History Narrative   Used to live independently in [place]   However had significant issues with severe gastroparesis resulting in severe hypokalemia and general weakness and fall resulting in hospitalization and SNF rehab   Now (10/2017) living with her daughter in [place] until able to stabilize and regain independence.      May 2019   Patient living in assisted living, [place]   Social Determinants of Health   Financial Resource Strain: Low Risk  (01/24/2018)   Overall Financial Resource Strain (CARDIA)    Difficulty of Paying Living Expenses: Not hard at all  Food Insecurity: No Food Insecurity (01/24/2018)   Hunger Vital Sign    Worried About Running Out of Food in the Last Year: Never true    Ran Out of Food in the Last Year:  Never true  Transportation Needs: No Transportation Needs (01/24/2018)   PRAPARE - Administrator, Civil Service (Medical): No    Lack of Transportation (Non-Medical): No  Physical Activity: Inactive (01/24/2018)   Exercise Vital Sign    Days of Exercise per Week: 0 days    Minutes of Exercise per Session: 0 min  Stress: No Stress Concern Present (01/24/2018)   [employer] of Occupational Health - Occupational Stress Questionnaire    Feeling of Stress : Not at all  Social Connections: Moderately Isolated (01/24/2018)   Social Connection and Isolation Panel [NHANES]    Frequency of Communication with Friends and Family: Once a week    Frequency of Social Gatherings with Friends and Family: Once a week    Attends Religious Services: More than 4 times per year    Active Member of [employer] or Organizations: No    Attends Banker Meetings: Never    Marital Status: Widowed  Intimate Partner Violence: Not At Risk (01/24/2018)   Humiliation, Afraid, Rape, and Kick questionnaire    Fear of Current or Ex-Partner: No    Emotionally Abused: No    Physically Abused: No    Sexually Abused: No    Physical Exam: Today's Vitals   06/13/23 1310  BP: (!) 167/72  Pulse: 73  Resp: 16  Temp: (!) 97.3 F (36.3 C)  TempSrc: Temporal  SpO2: 98%  Weight: 51.7 kg  Height: 5\' 3"  (1.6 m)  PainSc: 0-No pain   Body mass index is 20.19 kg/m. GEN: NAD EYE: Sclerae anicteric ENT: MMM CV: Non-tachycardic GI: Soft, NT/ND NEURO:  Alert & Oriented x 3  Lab Results: No results for input(s): "WBC", "HGB", "HCT", "PLT" in the last 72 hours. BMET No results for input(s): "NA", "K", "CL", "CO2", "GLUCOSE", "BUN", "CREATININE", "CALCIUM" in the last 72 hours. LFT No results for input(s): "PROT", "ALBUMIN", "AST", "ALT", "ALKPHOS", "BILITOT", "BILIDIR", "IBILI" in the last 72 hours. PT/INR No results for input(s): "LABPROT", "INR" in the last 72 hours.   Impression / Plan: This is a  82 y.o.female who presents for ERCP for concern of recurrent choledocholithiasis in history of Bilroth 2 anatomy (very difficult ERCPs as previously documented) but cannot get MRI/MRCP.  The risks of an ERCP were discussed at length, including but not limited to the risk of perforation, bleeding, abdominal pain, post-ERCP pancreatitis (while usually mild can be severe and even life threatening).  The risks and benefits of endoscopic evaluation/treatment were discussed with the patient and/or family;  these include but are not limited to the risk of perforation, infection, bleeding, missed lesions, lack of diagnosis, severe illness requiring hospitalization, as well as anesthesia and sedation related illnesses.  The patient's history has been reviewed, patient examined, no change in status, and deemed stable for procedure.  The patient and/or family is agreeable to proceed.    Justice Britain, MD [facility] Office # [phone]

## 2023-06-13 NOTE — Transfer of Care (Signed)
Immediate Anesthesia Transfer of Care Note  Patient: Judith [name]  Procedure(s) Performed: ENDOSCOPIC RETROGRADE CHOLANGIOPANCREATOGRAPHY (ERCP) WITH PROPOFOL REMOVAL OF STONES BILIARY DILATION  Patient Location: PACU  Anesthesia Type:MAC  Level of Consciousness: awake and alert   Airway & Oxygen Therapy: Patient Spontanous Breathing and Patient connected to nasal cannula oxygen  Post-op Assessment: Report given to RN and Post -op Vital signs reviewed and stable  Post vital signs: Reviewed and stable  Last Vitals:  Vitals Value Taken Time  BP 164/60 06/13/23 1730  Temp    Pulse 70 06/13/23 1731  Resp 18 06/13/23 1731  SpO2 97 % 06/13/23 1731  Vitals shown include unvalidated device data.  Last Pain:  Vitals:   06/13/23 1310  TempSrc: Temporal  PainSc: 0-No pain         Complications: No notable events documented.

## 2023-06-13 NOTE — H&P (Signed)
History and Physical    Patient: Judith [name] ZOX:[id number] DOB: 1941/07/11 DOA: 06/13/2023 DOS: the patient was seen and examined on 06/13/2023 PCP: Darral Dash, DO  Patient coming from:  Patient is being admitted from the PACU History obtained to a great extent from gastroenterology Dr. Meridee Score.  Also from the patient to some extent.  Chief Complaint: No chief complaint on file.  HPI: Judith [name] is a 82 y.o. female with medical history significant of prior Billroth II procedure done, unfortunately patient also has had repeated episodes of choledocholithiasis.  About a month ago patient was having new abdominal pain with noninvasive testing being concerning for choledocholithiasis recurrence.  Given that the patient has pacemaker, an MRI was not feasible.  Patient was therefore admitted for an ERCP today and the procedure lasted approximately 2 hours given the patient's difficult anatomy.  With patient being positioned in various positions such as prone, sideways as well as supine.  Since waking up from the procedure, patient is complaining of epigastric pain that has since radiated to the back at the same level.  Patient has not vomited.  Patient is making urine and there has been no rigors or fever.  There is no report of diarrhea.  Patient's pain has been controlled with IV opiates.  Patient at this time offers no other complaints except well-controlled pain described as above.  Medical evaluation and observation admission is sought. Review of Systems: As mentioned in the history of present illness. All other systems reviewed and are negative. Past Medical History:  Diagnosis Date   Anemia    Arthritis    Biliary gastritis    Blood transfusion without reported diagnosis    2007   Cervical spine arthritis 04/05/2016   Choledocholithiasis    Controlled type 2 diabetes mellitus with diabetic neuropathy, with long-term current use of insulin (HCC)    Depression    Diabetes  mellitus type 2 with complications (HCC)    Dilated bile duct    Duodenal ulcer disease    Encounter for long-term (current) use of insulin (HCC)    Gallstones 01/17/2018   Gastric ulcer    Gastritis and gastroduodenitis    Gastroparesis    severe   GERD (gastroesophageal reflux disease)    Gout    tested for gout but was not   Heart murmur    Hiatal hernia    Hyperlipidemia    in past not now   Hypertension    Hypokalemia, gastrointestinal losses    secondary to severe gastroparesis   Internal hemorrhoids    Long-term insulin use in type 2 diabetes (HCC) 04/05/2016   Osteoporosis    just had bone density test possible    Peripheral neuropathy    Presence of cardiac pacemaker    S/P partial gastrectomy    due to severe gastric and gudoenal ulcers   Seasonal allergies    Sleep apnea    could not use CPAP   UTI (urinary tract infection)    Past Surgical History:  Procedure Laterality Date   BILIARY DILATION  02/01/2022   Procedure: BILIARY DILATION;  Surgeon: Lemar Lofty., MD;  Location: [facility] ENDOSCOPY;  Service: Gastroenterology;;   BILIARY DILATION  04/09/2022   Procedure: BILIARY DILATION;  Surgeon: Lemar Lofty., MD;  Location: [facility] ENDOSCOPY;  Service: Gastroenterology;;   BILIARY STENT PLACEMENT N/A 11/30/2021   Procedure: BILIARY STENT PLACEMENT;  Surgeon: Lemar Lofty., MD;  Location: [facility] ENDOSCOPY;  Service: Gastroenterology;  Laterality: N/A;  BILIARY STENT PLACEMENT  02/01/2022   Procedure: BILIARY STENT PLACEMENT;  Surgeon: Meridee Score Netty Starring., MD;  Location: [facility] ENDOSCOPY;  Service: Gastroenterology;;   BIOPSY  11/30/2021   Procedure: BIOPSY;  Surgeon: Lemar Lofty., MD;  Location: [facility] ENDOSCOPY;  Service: Gastroenterology;;   BIOPSY  04/09/2022   Procedure: BIOPSY;  Surgeon: Lemar Lofty., MD;  Location: [facility] ENDOSCOPY;  Service: Gastroenterology;;   CHOLECYSTECTOMY N/A 12/06/2021   Procedure: LAPAROSCOPIC  CHOLECYSTECTOMY, LYSIS OF ADHESIONS;  Surgeon: Harriette Bouillon, MD;  Location: [facility] ORS;  Service: General;  Laterality: N/A;   COLONOSCOPY  02/11/2018   no polyps, + small internal hemorrhoids   ENDOSCOPIC RETROGRADE CHOLANGIOPANCREATOGRAPHY (ERCP) WITH PROPOFOL N/A 11/30/2021   Procedure: ENDOSCOPIC RETROGRADE CHOLANGIOPANCREATOGRAPHY (ERCP) WITH PROPOFOL;  Surgeon: Lemar Lofty., MD;  Location: [facility] ENDOSCOPY;  Service: Gastroenterology;  Laterality: N/A;   ENDOSCOPIC RETROGRADE CHOLANGIOPANCREATOGRAPHY (ERCP) WITH PROPOFOL N/A 02/01/2022   Procedure: ENDOSCOPIC RETROGRADE CHOLANGIOPANCREATOGRAPHY (ERCP) WITH PROPOFOL;  Surgeon: Meridee Score Netty Starring., MD;  Location: [facility] ENDOSCOPY;  Service: Gastroenterology;  Laterality: N/A;   ENDOSCOPIC RETROGRADE CHOLANGIOPANCREATOGRAPHY (ERCP) WITH PROPOFOL N/A 04/09/2022   Procedure: ENDOSCOPIC RETROGRADE CHOLANGIOPANCREATOGRAPHY (ERCP) WITH PROPOFOL;  Surgeon: Meridee Score Netty Starring., MD;  Location: [facility] ENDOSCOPY;  Service: Gastroenterology;  Laterality: N/A;   ESOPHAGOGASTRODUODENOSCOPY (EGD) WITH PROPOFOL N/A 11/30/2021   Procedure: ESOPHAGOGASTRODUODENOSCOPY (EGD) WITH PROPOFOL;  Surgeon: Meridee Score Netty Starring., MD;  Location: [facility] ENDOSCOPY;  Service: Gastroenterology;  Laterality: N/A;   EUS N/A 12/12/2017   Procedure: UPPER ENDOSCOPIC ULTRASOUND (EUS) RADIAL;  Surgeon: Rachael Fee, MD;  Location: [facility] ENDOSCOPY;  Service: Endoscopy;  Laterality: N/A;   GASTRECTOMY     JOINT REPLACEMENT     pace maker     PARTIAL GASTRECTOMY     PPM GENERATOR CHANGEOUT N/A 04/25/2023   Procedure: PPM GENERATOR CHANGEOUT;  Surgeon: Thurmon Fair, MD;  Location: [facility] INVASIVE CV LAB;  Service: Cardiovascular;  Laterality: N/A;   REMOVAL OF STONES  02/01/2022   Procedure: REMOVAL OF STONES;  Surgeon: Meridee Score Netty Starring., MD;  Location: [facility] ENDOSCOPY;  Service: Gastroenterology;;   REMOVAL OF STONES  04/09/2022   Procedure: REMOVAL OF STONES;  Surgeon: Lemar Lofty., MD;  Location: [facility] ENDOSCOPY;  Service: Gastroenterology;;   Dennison Mascot  11/30/2021   Procedure: Dennison Mascot;  Surgeon: Lemar Lofty., MD;  Location: [facility] ENDOSCOPY;  Service: Gastroenterology;;   Francine Graven REMOVAL  02/01/2022   Procedure: STENT REMOVAL;  Surgeon: Lemar Lofty., MD;  Location: [facility] ENDOSCOPY;  Service: Gastroenterology;;   Francine Graven REMOVAL  04/09/2022   Procedure: STENT REMOVAL;  Surgeon: Lemar Lofty., MD;  Location: [facility] ENDOSCOPY;  Service: Gastroenterology;;   SUBMUCOSAL TATTOO INJECTION  11/30/2021   Procedure: SUBMUCOSAL TATTOO INJECTION;  Surgeon: Lemar Lofty., MD;  Location: [facility] ENDOSCOPY;  Service: Gastroenterology;;   TONSILLECTOMY     45 years ago   UPPER GASTROINTESTINAL ENDOSCOPY     Social History:  reports that she has never smoked. She has never used smokeless tobacco. She reports that she does not drink alcohol and does not use drugs.  Allergies  Allergen Reactions   Asa [Aspirin] Other (See Comments)    Causes ulcers    Ibuprofen Other (See Comments)    GI   Ciprofloxacin Rash    Able to tolerate eye drops - not on nursing home records   Sulfa Antibiotics Other (See Comments)    GI issues    Family History  Problem Relation Age of Onset   Diabetes Mother  Diabetes Father    Heart disease Father    Diabetes Sister    Heart disease Son    Colon cancer Neg Hx    Colon polyps Neg Hx    Rectal cancer Neg Hx    Stomach cancer Neg Hx    Esophageal cancer Neg Hx     Prior to Admission medications   Medication Sig Start Date End Date Taking? Authorizing Provider  acetaminophen (TYLENOL) 500 MG tablet Take 1 tablet (500 mg total) by mouth every 6 (six) hours as needed. 05/27/23  Yes Dameron, Nolberto Hanlon, DO  amoxicillin-clavulanate (AUGMENTIN) 875-125 MG tablet Take 1 tablet by mouth 2 (two) times daily for 3 days. 06/13/23 06/16/23 Yes Mansouraty, Netty Starring., MD  Biotin 1000 MCG tablet Take 1,000 mcg by mouth  daily.    Yes [provider]  carvedilol (COREG) 12.5 MG tablet Take 1 tablet (12.5 mg total) by mouth 2 (two) times daily with a meal. 05/17/23  Yes McDiarmid, Leighton Roach, MD  cetirizine (ZYRTEC) 10 MG tablet Take 10 mg by mouth daily as needed (itching).   Yes [provider]  colchicine 0.6 MG tablet Take 0.6 mg by mouth daily.   Yes [provider]  DULoxetine (CYMBALTA) 60 MG capsule Take 60 mg by mouth daily.   Yes [provider]  famotidine (PEPCID) 20 MG tablet Take 1 tablet (20 mg total) by mouth at bedtime. 06/11/19  Yes Lezlie Lye, Meda Coffee, MD  fluocinonide-emollient (LIDEX-E) 0.05 % cream Apply 1 Application topically See admin instructions. "Apply to stomach topically every 24 hours as needed for at bedtime"   Yes [provider]  fluticasone (FLONASE) 50 MCG/ACT nasal spray Place 2 sprays into both nostrils daily. 11/01/22  Yes Dameron, Nolberto Hanlon, DO  gabapentin (NEURONTIN) 100 MG capsule Take 100-400 mg by mouth See admin instructions. Take 100mg  in the AM, 100mg  in the afternoon, and 400mg  at bedtime.   Yes [provider]  insulin glargine (LANTUS) 100 unit/mL SOPN Inject 18 Units into the skin daily. 05/18/23  Yes Maxwell, Allee, MD  insulin lispro (HUMALOG KWIKPEN) 100 UNIT/ML KwikPen Inject 0-12 Units into the skin 3 (three) times daily before meals. 80-150 = 8 units, 151-200 = 9 units, 201-250 = 10 units, 251-300 = 11 units, 301-350 = 14 units, 351-400 = 16 units and recheck at next meal, and call MD if multiple readings > 350 in a day. 05/27/23  Yes Dameron, Nolberto Hanlon, DO  LIDOCAINE EX Apply 1 patch topically daily. Apply to lower back topically in the morning for back pain and remove per schedule   Yes [provider]  losartan (COZAAR) 50 MG tablet Take 1 tablet (50 mg total) by mouth daily. 08/30/22  Yes Hensel, Santiago Bumpers, MD  Multiple Vitamins-Minerals (MULTIVITAMIN ADULT) CHEW Chew 1 tablet by mouth daily.   Yes [provider]  ondansetron (ZOFRAN) 4 MG tablet Take 1 tablet (4 mg total) by mouth every 6 (six) hours. Patient taking differently: Take 4 mg by mouth every 6 (six) hours as needed for vomiting or nausea. 04/24/23  Yes Maury Dus, MD  PRESCRIPTION MEDICATION Take 3 tablets by mouth See admin instructions. Glucose Chewable Tablet - Give 3 tablets by mouth every 15 minutes as needed for blood glucose less than 70. Repeat if blood glucose is still below 100   Yes [provider]  rosuvastatin (CRESTOR) 10 MG tablet Take 1 tablet (10 mg total) by mouth daily. 11/15/20  Yes Just, Azalee Course, FNP  solifenacin (VESICARE) 10 MG tablet Take 1 tablet (10 mg total) by mouth daily. 08/21/22  Yes Dameron, Nolberto Hanlon, DO  Insulin Pen Needle (B-D ULTRAFINE III SHORT PEN) 31G X 8 MM MISC To use as directed with insulin, four times a day. Dx E11.9, Z79.4 11/08/17   Lezlie Lye, Meda Coffee, MD  pantoprazole (PROTONIX) 40 MG tablet Take 1 tablet (40 mg total) by mouth 2 (two) times daily before a meal. Twice daily for 1 month 06/13/23   Mansouraty, Netty Starring., MD    Physical Exam: Vitals:   06/13/23 1930 06/13/23 1945 06/13/23 1959 06/13/23 2000  BP: (!) 190/67 (!) 189/69 (!) 162/49 (!) 157/50  Pulse: 73 75 68 68  Resp: 15 14 11 11   Temp:      TempSrc:      SpO2: 93% 94% 92% 92%  Weight:      Height:      General: Thin lady seems to be slightly sleepy still from her anesthesia.  However no immediate distress is apparent.  Patient just voided. Respiratory exam: Bilateral intravesicular Cardiovascular exam S1-S2 normal Abdomen slight epigastric tenderness however no guarding or rebound, bowel sounds normal, no soft focal spinal tenderness could be elicited Extremities warm without edema Neurologically patient is alert awake reasonably coherent and conversant.  No focal motor deficit Data Reviewed:  Labs on Admission:  Results for orders placed or performed during the hospital encounter of 06/13/23  (from the past 24 hour(s))  Glucose, capillary     Status: Abnormal   Collection Time: 06/13/23  1:25 PM  Result Value Ref Range   Glucose-Capillary 242 (H) 70 - 99 mg/dL  Glucose, capillary     Status: Abnormal   Collection Time: 06/13/23  6:37 PM  Result Value Ref Range   Glucose-Capillary 288 (H) 70 - 99 mg/dL  CBC     Status: Abnormal   Collection Time: 06/13/23  6:45 PM  Result Value Ref Range   WBC 17.8 (H) 4.0 - 10.5 K/uL   RBC 4.02 3.87 - 5.11 MIL/uL   Hemoglobin 12.7 12.0 - 15.0 g/dL   HCT 40.9 81.1 - 91.4 %   MCV 99.8 80.0 - 100.0 fL   MCH 31.6 26.0 - 34.0 pg   MCHC 31.7 30.0 - 36.0 g/dL   RDW 78.2 95.6 - 21.3 %   Platelets 238 150 - 400 K/uL   nRBC 0.0 0.0 - 0.2 %  Comprehensive metabolic panel     Status: Abnormal   Collection Time: 06/13/23  6:45 PM  Result Value Ref Range   Sodium 135 135 - 145 mmol/L   Potassium 4.1 3.5 - 5.1 mmol/L   Chloride 98 98 - 111 mmol/L   CO2 25 22 - 32 mmol/L   Glucose, Bld 321 (H) 70 - 99 mg/dL   BUN 18 8 - 23 mg/dL   Creatinine, Ser 0.86 0.44 - 1.00 mg/dL   Calcium 9.2 8.9 - 57.8 mg/dL   Total Protein 7.3 6.5 - 8.1 g/dL   Albumin 4.1 3.5 - 5.0 g/dL   AST 469 (H) 15 - 41 U/L   ALT 26 0 - 44 U/L   Alkaline Phosphatase 59 38 - 126 U/L   Total Bilirubin 1.3 (H) 0.3 - 1.2 mg/dL   GFR, Estimated >62 >95 mL/min   Anion gap 12 5 - 15  Lipase, blood     Status: None   Collection Time: 06/13/23  6:45 PM  Result Value Ref Range   Lipase 22 11 -  51 U/L   Basic Metabolic Panel: Recent Labs  Lab 06/13/23 1845  NA 135  K 4.1  CL 98  CO2 25  GLUCOSE 321*  BUN 18  CREATININE 0.74  CALCIUM 9.2   Liver Function Tests: Recent Labs  Lab 06/13/23 1845  AST 104*  ALT 26  ALKPHOS 59  BILITOT 1.3*  PROT 7.3  ALBUMIN 4.1   Recent Labs  Lab 06/13/23 1845  LIPASE 22   No results for input(s): "AMMONIA" in the last 168 hours. CBC: Recent Labs  Lab 06/13/23 1845  WBC 17.8*  HGB 12.7  HCT 40.1  MCV 99.8  PLT 238    Cardiac Enzymes: No results for input(s): "CKTOTAL", "CKMB", "CKMBINDEX", "TROPONINIHS" in the last 168 hours.  BNP (last 3 results) No results for input(s): "PROBNP" in the last 8760 hours. CBG: Recent Labs  Lab 06/13/23 1325 06/13/23 1837  GLUCAP 242* 288*    Radiological Exams on Admission:  DG Abd Portable 2V  Result Date: 06/13/2023 CLINICAL DATA:  Abdomen pain following your CT EXAM: PORTABLE ABDOMEN - 2 VIEW COMPARISON:  CT 04/23/2023 FINDINGS: Nonobstructed gas pattern with moderate stool burden. Levoscoliosis. Decubitus view demonstrates no definitive free air. IMPRESSION: Nonobstructed gas pattern with moderate stool burden. Electronically Signed   By: Jasmine Pang M.D.   On: 06/13/2023 18:05   DG CHEST PORT 1 VIEW  Result Date: 06/13/2023 CLINICAL DATA:  Abdomen pain EXAM: PORTABLE CHEST 1 VIEW COMPARISON:  04/22/2018 FINDINGS: Bilateral shoulder replacements. Left-sided pacing device as before. No acute airspace disease or effusion. Normal cardiac size. Aortic atherosclerosis. IMPRESSION: No active disease. Electronically Signed   By: Jasmine Pang M.D.   On: 06/13/2023 18:04   DG C-Arm 1-60 Min-No Report  Result Date: 06/13/2023 Fluoroscopy was utilized by the requesting physician.  No radiographic interpretation.    EKG: pending  Assessment and Plan: Abdominal pain Post procedural, lipase within normal limits.  X-ray and abdomen and chest nonactionable.  At this time vitally stable.  I will check a CT chest abdomen pelvis with contrast.  Check troponin, pending EKG.  CBC and white count seem to be elevated.  Blood cultures will be sent.  Patient has been continued on perioperative Unasyn.  I have ordered patient for oxycodone and Dilaudid as needed for moderate to severe pain.  I will also order standing acetaminophen       Advance Care Planning:   Code Status: Prior full code  Consults: GI team will follow up in AM  Family Communication: updated by GI  doc.  Severity of Illness: The appropriate patient status for this patient is OBSERVATION. Observation status is judged to be reasonable and necessary in order to provide the required intensity of service to ensure the patient's safety. The patient's presenting symptoms, physical exam findings, and initial radiographic and laboratory data in the context of their medical condition is felt to place them at decreased risk for further clinical deterioration. Furthermore, it is anticipated that the patient will be medically stable for discharge from the hospital within 2 midnights of admission.   Author: Nolberto Hanlon, MD 06/13/2023 8:03 PM  For on call review [email or web address].

## 2023-06-14 DIAGNOSIS — G8918 Other acute postprocedural pain: Secondary | ICD-10-CM | POA: Diagnosis not present

## 2023-06-14 DIAGNOSIS — K8050 Calculus of bile duct without cholangitis or cholecystitis without obstruction: Secondary | ICD-10-CM | POA: Diagnosis not present

## 2023-06-14 DIAGNOSIS — K5080 Crohn's disease of both small and large intestine without complications: Secondary | ICD-10-CM | POA: Diagnosis not present

## 2023-06-14 LAB — CREATININE, SERUM
Creatinine, Ser: 0.63 mg/dL (ref 0.44–1.00)
GFR, Estimated: >60 mL/min (ref >60)

## 2023-06-14 LAB — BASIC METABOLIC PANEL
Anion gap: 9 (ref 5–15)
BUN: 16 mg/dL (ref 8–23)
CO2: 27 mmol/L (ref 22–32)
Calcium: 9 mg/dL (ref 8.9–10.3)
Chloride: 98 mmol/L (ref 98–111)
Creatinine, Ser: 0.7 mg/dL (ref 0.44–1.00)
GFR, Estimated: >60 mL/min (ref >60)
Glucose, Bld: 203 mg/dL — ABNORMAL HIGH (ref 70–99)
Potassium: 3.9 mmol/L (ref 3.5–5.1)
Sodium: 134 mmol/L — ABNORMAL LOW (ref 135–145)

## 2023-06-14 LAB — GLUCOSE, CAPILLARY
Glucose-Capillary: 196 mg/dL — ABNORMAL HIGH (ref 70–99)
Glucose-Capillary: 306 mg/dL — ABNORMAL HIGH (ref 70–99)

## 2023-06-14 LAB — CBC
HCT: 38.2 % (ref 36.0–46.0)
HCT: 38.3 % (ref 36.0–46.0)
Hemoglobin: 12.4 g/dL (ref 12.0–15.0)
Hemoglobin: 12.4 g/dL (ref 12.0–15.0)
MCH: 32.2 pg (ref 26.0–34.0)
MCH: 32.5 pg (ref 26.0–34.0)
MCHC: 32.4 g/dL (ref 30.0–36.0)
MCHC: 32.5 g/dL (ref 30.0–36.0)
MCV: 100.5 fL — ABNORMAL HIGH (ref 80.0–100.0)
MCV: 99.2 fL (ref 80.0–100.0)
Platelets: 219 10*3/uL (ref 150–400)
Platelets: 229 10*3/uL (ref 150–400)
RBC: 3.81 MIL/uL — ABNORMAL LOW (ref 3.87–5.11)
RBC: 3.85 MIL/uL — ABNORMAL LOW (ref 3.87–5.11)
RDW: 13.4 % (ref 11.5–15.5)
RDW: 13.4 % (ref 11.5–15.5)
WBC: 11.6 10*3/uL — ABNORMAL HIGH (ref 4.0–10.5)
WBC: 13.8 10*3/uL — ABNORMAL HIGH (ref 4.0–10.5)
nRBC: 0 % (ref 0.0–0.2)
nRBC: 0 % (ref 0.0–0.2)

## 2023-06-14 LAB — HEPATIC FUNCTION PANEL
ALT: 22 U/L (ref 0–44)
AST: 41 U/L (ref 15–41)
Albumin: 4 g/dL (ref 3.5–5.0)
Alkaline Phosphatase: 55 U/L (ref 38–126)
Bilirubin, Direct: 0.1 mg/dL (ref 0.0–0.2)
Indirect Bilirubin: 1.2 mg/dL — ABNORMAL HIGH (ref 0.3–0.9)
Total Bilirubin: 1.3 mg/dL — ABNORMAL HIGH (ref 0.3–1.2)
Total Protein: 6.9 g/dL (ref 6.5–8.1)

## 2023-06-14 LAB — PROTIME-INR
INR: 1.1 (ref 0.8–1.2)
Prothrombin Time: 14 seconds (ref 11.4–15.2)

## 2023-06-14 LAB — APTT: aPTT: 28 seconds (ref 24–36)

## 2023-06-14 LAB — TROPONIN I (HIGH SENSITIVITY): Troponin I (High Sensitivity): 10 ng/L (ref <18)

## 2023-06-14 LAB — HEMOGLOBIN A1C
Hgb A1c MFr Bld: 7.3 % — ABNORMAL HIGH (ref 4.8–5.6)
Mean Plasma Glucose: 162.81 mg/dL

## 2023-06-14 LAB — LIPASE, BLOOD: Lipase: 21 U/L (ref 11–51)

## 2023-06-14 MED ORDER — AMOXICILLIN-POT CLAVULANATE 875-125 MG PO TABS: 1.0000 | ORAL_TABLET | Freq: Two times a day (BID) | ORAL | 0 refills | Status: AC

## 2023-06-14 NOTE — Plan of Care (Signed)

## 2023-06-14 NOTE — Progress Notes (Signed)
Pt was discharged home today. Instructions were reviewed with patient, and questions were answered. Pt was taken to main entrance via wheelchair by NT.  

## 2023-06-14 NOTE — Progress Notes (Signed)
Mobility Specialist - Progress Note   06/14/23 1127  Mobility  Activity Ambulated with assistance in hallway  Level of Assistance Standby assist, set-up cues, supervision of patient - no hands on  Assistive Device None (HHA)  Distance Ambulated (ft) 250 ft  Activity Response Tolerated well  Mobility Referral Yes  $Mobility charge 1 Mobility  Mobility Specialist Start Time (ACUTE ONLY) 1117  Mobility Specialist Stop Time (ACUTE ONLY) 1125  Mobility Specialist Time Calculation (min) (ACUTE ONLY) 8 min   Pt received in bed and agreeable to mobility. Pt held on to wall railing during ambulation for support. No complaints during session. Pt to recliner after session with all needs met.    [facility]

## 2023-06-14 NOTE — Progress Notes (Addendum)
Progress Note  Primary GI: Dr. Rhea Belton  LOS: 0 days   Chief Complaint: Post ERCP pain   Subjective   Patient states she no longer has pain today. Although, could be secondary to pain medication. States she hasn't had pain since very early AM. Tolerating clears without difficulty.    Objective   Vital signs in last 24 hours: Temp:  [97.3 F (36.3 C)-98.4 F (36.9 C)] 98.4 F (36.9 C) (06/21 0445) Pulse Rate:  [66-88] 88 (06/21 0445) Resp:  [10-18] 16 (06/21 0445) BP: (104-196)/(49-163) 155/70 (06/21 0445) SpO2:  [91 %-98 %] 91 % (06/21 0445) Weight:  [51.7 kg] 51.7 kg (06/20 1310) Last BM Date : 06/13/23 Last BM recorded by nurses in past 5 days Stool Type: Type 5 (Soft blobs with clear-cut edges) (06/14/2023  2:00 AM)  General:   female in no acute distress  Heart:  Regular rate and rhythm; no murmurs Pulm: Clear anteriorly; no wheezing Abdomen: soft, nondistended, normal bowel sounds in all quadrants. Nontender without guarding. No organomegaly appreciated. Extremities:  No edema Neurologic:  Alert and  oriented x4;  No focal deficits.  Psych:  Cooperative. Normal mood and affect.  Intake/Output from previous day: 06/20 0701 - 06/21 0700 In: 2406.3 [P.O.:240; I.V.:1566.3; IV Piggyback:600] Out: 1450 [Urine:1450] Intake/Output this shift: Total I/O In: 120 [P.O.:120] Out: -   Studies/Results: DG ERCP  Result Date: 06/14/2023 CLINICAL DATA:  Bile duct stone. EXAM: ERCP TECHNIQUE: Multiple spot images obtained with the fluoroscopic device and submitted for interpretation post-procedure. FLUOROSCOPY: Radiation Exposure Index (as provided by the fluoroscopic device): 24.95 mGy Kerma COMPARISON:  CT abdomen and pelvis 06/13/2023 FINDINGS: Multiple images were obtained demonstrating the course of the scope and wire through the gastrojejunostomy. Retrograde cholangiogram images demonstrates dilatation of the extrahepatic bile duct. Balloon inflation compatible with a  sphincteroplasty. Evidence for a balloon sweep. IMPRESSION: 1. Dilatation of the extrahepatic bile duct. 2. Evidence for balloon sphincteroplasty and balloon sweep. 3. These images were submitted for radiologic interpretation only. Please see the procedural report. Electronically Signed   By: Richarda Overlie M.D.   On: 06/14/2023 08:29   CT CHEST ABDOMEN PELVIS W CONTRAST  Result Date: 06/13/2023 CLINICAL DATA:  Epigastric pain, status post ERCP. EXAM: CT CHEST, ABDOMEN, AND PELVIS WITH CONTRAST TECHNIQUE: Multidetector CT imaging of the chest, abdomen and pelvis was performed following the standard protocol during bolus administration of intravenous contrast. RADIATION DOSE REDUCTION: This exam was performed according to the departmental dose-optimization program which includes automated exposure control, adjustment of the mA and/or kV according to patient size and/or use of iterative reconstruction technique. CONTRAST:  80mL OMNIPAQUE IOHEXOL 300 MG/ML  SOLN COMPARISON:  04/23/2023. FINDINGS: CT CHEST FINDINGS Cardiovascular: The heart is normal in size and there is no pericardial effusion. Pacemaker leads are noted in the heart. Scattered coronary artery calcifications are noted. There is atherosclerotic calcification of the aorta without evidence of aneurysm. The pulmonary trunk is normal in caliber. Mediastinum/Nodes: No enlarged mediastinal, hilar, or axillary lymph nodes. Thyroid gland, trachea, and esophagus demonstrate no significant findings. A small hiatal hernia is noted. Lungs/Pleura: Pleural and parenchymal scarring is present bilaterally. Subpleural reticulations and bronchiectasis are noted bilaterally. There is atelectasis in the lower lobes. No effusion or pneumothorax. A 3 mm nodule is noted in the left upper lobe, axial image 82. Musculoskeletal: A pacemaker device is present in the anterior chest wall on the left. Shoulder arthroplasty changes are noted bilaterally. Degenerative changes are  present in the  thoracic spine. No acute osseous abnormality. CT ABDOMEN PELVIS FINDINGS Hepatobiliary: No focal abnormality is seen in the liver. Intrahepatic and extrahepatic biliary ductal dilatation is present. The common bile duct measures 1.3 cm. Mild pneumobilia is noted. Fat stranding and edema are noted at the porta hepatis. Pancreas: Fatty atrophy of the pancreas. No pancreatic ductal dilatation. There is mild fat stranding in the region of the pancreatic head at the common bile duct. Spleen: Normal in size without focal abnormality. Adrenals/Urinary Tract: The adrenal glands are within normal limits. The kidneys enhance symmetrically. Renal cortical scarring is noted on the right. Renal cysts are present bilaterally. Additional subcentimeter hypodensities are noted which are too small to further characterize. No renal calculus or hydronephrosis. The bladder is unremarkable. Stomach/Bowel: A small hiatal hernia is noted. Gastric surgical changes are present. Appendix appears normal. No evidence of bowel wall thickening, distention, or inflammatory changes. No free air or pneumatosis. A moderate amount of retained stool is present in the colon. Vascular/Lymphatic: Aortic atherosclerosis. No enlarged abdominal or pelvic lymph nodes. Reproductive: Uterus and bilateral adnexa are unremarkable. Other: No abdominopelvic ascites. Musculoskeletal: Degenerative changes in the lumbar spine. No acute osseous abnormality. IMPRESSION: 1. Status post ERCP with biliary ductal dilatation and pneumobilia. The common bile duct is distended with surrounding fat stranding at the porta hepatis extending into the region of the pancreatic head, possible pancreatitis. Correlation with amylase and lipase is suggested. 2. Gastric surgery changes with small hiatal hernia. 3. 3 mm left upper lobe pulmonary nodule. No follow-up needed if patient is low-risk.This recommendation follows the consensus statement: Guidelines for Management  of Incidental Pulmonary Nodules Detected on CT Images: From the Fleischner Society 2017; Radiology 2017; 284:228-243. 4. Aortic atherosclerosis and coronary artery calcifications. Electronically Signed   By: Thornell Sartorius M.D.   On: 06/13/2023 21:07   DG Abd Portable 2V  Result Date: 06/13/2023 CLINICAL DATA:  Abdomen pain following your CT EXAM: PORTABLE ABDOMEN - 2 VIEW COMPARISON:  CT 04/23/2023 FINDINGS: Nonobstructed gas pattern with moderate stool burden. Levoscoliosis. Decubitus view demonstrates no definitive free air. IMPRESSION: Nonobstructed gas pattern with moderate stool burden. Electronically Signed   By: Jasmine Pang M.D.   On: 06/13/2023 18:05   DG CHEST PORT 1 VIEW  Result Date: 06/13/2023 CLINICAL DATA:  Abdomen pain EXAM: PORTABLE CHEST 1 VIEW COMPARISON:  04/22/2018 FINDINGS: Bilateral shoulder replacements. Left-sided pacing device as before. No acute airspace disease or effusion. Normal cardiac size. Aortic atherosclerosis. IMPRESSION: No active disease. Electronically Signed   By: Jasmine Pang M.D.   On: 06/13/2023 18:04   DG C-Arm 1-60 Min-No Report  Result Date: 06/13/2023 Fluoroscopy was utilized by the requesting physician.  No radiographic interpretation.    Lab Results: Recent Labs    06/13/23 1845 06/13/23 2330 06/14/23 0447  WBC 17.8* 13.8* 11.6*  HGB 12.7 12.4 12.4  HCT 40.1 38.3 38.2  PLT 238 219 229   BMET Recent Labs    06/13/23 1845 06/13/23 2330 06/14/23 0447  NA 135  --  134*  K 4.1  --  3.9  CL 98  --  98  CO2 25  --  27  GLUCOSE 321*  --  203*  BUN 18  --  16  CREATININE 0.74 0.63 0.70  CALCIUM 9.2  --  9.0   LFT Recent Labs    06/14/23 0447  PROT 6.9  ALBUMIN 4.0  AST 41  ALT 22  ALKPHOS 55  BILITOT 1.3*  BILIDIR 0.1  IBILI  1.2*   PT/INR Recent Labs    06/14/23 0447  LABPROT 14.0  INR 1.1     Scheduled Meds:  acetaminophen  650 mg Oral Q4H   carvedilol  12.5 mg Oral BID WC   DULoxetine  60 mg Oral Daily    enoxaparin (LOVENOX) injection  40 mg Subcutaneous Q24H   fentaNYL (SUBLIMAZE) injection  25 mcg Intravenous Once   fentaNYL (SUBLIMAZE) injection  25 mcg Intravenous Once   fesoterodine  4 mg Oral Daily   gabapentin  100 mg Oral BID   gabapentin  400 mg Oral QHS   insulin aspart       insulin aspart  0-15 Units Subcutaneous TID WC   insulin aspart  0-5 Units Subcutaneous QHS   insulin glargine-yfgn  18 Units Subcutaneous QHS   losartan  50 mg Oral Daily   pantoprazole (PROTONIX) IV  40 mg Intravenous Q24H   rosuvastatin  10 mg Oral Daily   sodium chloride flush  3 mL Intravenous Q12H   Continuous Infusions:  sodium chloride     ampicillin-sulbactam (UNASYN) IV 3 g (06/14/23 0520)   lactated ringers Stopped (06/13/23 1714)      Patient profile:   Judith [name] is a 82 y.o. female who presents for post ERCP pain. History of recurrent choledocholithiasis in history of Bilroth 2 anatomy (very difficult ERCPs as previously documented) but cannot get MRI/MRCP.    Impression:   Post ERCP pain -ERCP 6/20 for choledocholithiasis: 1 cm hiatal hernia, patent Billroth II gastrojejunostomy, extremely difficult attempt to obtain access due to patient's previous surgery and angulations in her bowel.  Prior biliary sphincterotomy/fistulotomy appeared open.  Fluoroscopic examination was suspicious for sludge.  Choledocholithiasis and biliary sludge was found.  Complete removal was accomplished by balloon sphincteroplasty and balloon sweeping. -CT chest abdomen pelvis with contrast post ERCP: Post ERCP with biliary ductal dilation and pneumobilia.  CBD distended with fat stranding at the porta hepatis extending into the region of the pancreatic head. -Past x-ray and abdominal x-ray negative -Lipase 21 -Liver enzymes normal    Plan:   - no further pain, normal Lipase, tolerating diet without difficulty. Low suspicion for post ERCP pancreatitis with normal lipase. Recommend advancing diet and see  how she does. Possibly discharge later this afternoon.  Bayley Leanna Sato  06/14/2023, 9:01 AM    Attending Physician Note   I have taken an interval history, reviewed the chart and examined the patient. I performed a substantive portion of this encounter, including complete performance of at least one of the key components, in conjunction with the APP. I agree with the APP's note, impression and recommendations with my edits. My additional impressions and recommendations are as follows.   S/P ERCP with removal of CBD stone, sludge yesterday with post procedure pain. Very difficult, prolonged ERCP S/P Bilroth II. CT CAP was unremarkable and no clear evidence of pancreatitis. Pain has resolved. Advance diet and if tolerated OK for discharge from GI standpoint. Outpatient GI follow up with Dr. Rhea Belton.   Claudette Head, MD [facility] See [facility], [facility], for our on call provider

## 2023-06-14 NOTE — Discharge Summary (Signed)
Physician Discharge Summary  Judith [name] GEX:[id number] DOB: Dec 11, 1941 DOA: 06/13/2023  PCP: Darral Dash, DO  Admit date: 06/13/2023 Discharge date: 06/14/2023  Admitted From: home Disposition:  home  Recommendations for Outpatient Follow-up:  Follow up with PCP in 1-2 weeks  Home Health: none Equipment/Devices: none  Discharge Condition: stable CODE STATUS: Full code  HPI: Per admitting MD, Judith [name] is a 82 y.o. female with medical history significant of prior Billroth II procedure done, unfortunately patient also has had repeated episodes of choledocholithiasis.  About a month ago patient was having new abdominal pain with noninvasive testing being concerning for choledocholithiasis recurrence.  Given that the patient has pacemaker, an MRI was not feasible.  Patient was therefore admitted for an ERCP today and the procedure lasted approximately 2 hours given the patient's difficult anatomy.  With patient being positioned in various positions such as prone, sideways as well as supine.  Since waking up from the procedure, patient is complaining of epigastric pain that has since radiated to the back at the same level.  Patient has not vomited.  Patient is making urine and there has been no rigors or fever.  There is no report of diarrhea.  Patient's pain has been controlled with IV opiates.   Hospital Course / Discharge diagnoses: Principal Problem:   Choledocholithiasis Active Problems:   Abdominal pain  Principal problem Recurrent choledocholithiasis -patient was initially seen in outpatient GI endoscopy suite and she underwent a fairly prolonged ERCP but eventually successful with complete removal of her choledocholithiasis and biliary sludge.  She has complained of epigastric abdominal pain after the procedure and she was admitted overnight for monitoring.  There was concern for pancreatitis.  Blood work showed normal lipase.  She did well, diet was slowly advanced, she  is tolerating a regular diet without further issues, and will be discharged home in stable condition.  GI recommends Augmentin for 3 days postprocedure  Active problems DM 2 -continue home medications on discharge Essential hypertension-continue home medications on discharge CHB status post pacemaker -outpatient follow-up Hyperlipidemia-continue statin  Sepsis ruled out   Discharge Instructions   Allergies as of 06/14/2023       Reactions   Asa [aspirin] Other (See Comments)   Causes ulcers   Ibuprofen Other (See Comments)   GI   Ciprofloxacin Rash   Able to tolerate eye drops - not on nursing home records   Sulfa Antibiotics Other (See Comments)   GI issues        Medication List     TAKE these medications    acetaminophen 500 MG tablet Commonly known as: TYLENOL Take 1 tablet (500 mg total) by mouth every 6 (six) hours as needed.   amoxicillin-clavulanate 875-125 MG tablet Commonly known as: AUGMENTIN Take 1 tablet by mouth 2 (two) times daily for 3 days.   Biotin 1000 MCG tablet Take 1,000 mcg by mouth daily.   carvedilol 12.5 MG tablet Commonly known as: COREG Take 1 tablet (12.5 mg total) by mouth 2 (two) times daily with a meal.   cetirizine 10 MG tablet Commonly known as: ZYRTEC Take 10 mg by mouth daily as needed (itching).   colchicine 0.6 MG tablet Take 0.6 mg by mouth daily.   DULoxetine 60 MG capsule Commonly known as: CYMBALTA Take 60 mg by mouth daily.   famotidine 20 MG tablet Commonly known as: PEPCID Take 1 tablet (20 mg total) by mouth at bedtime.   fluocinonide-emollient 0.05 % cream Commonly known  as: LIDEX-E Apply 1 Application topically See admin instructions. "Apply to stomach topically every 24 hours as needed for at bedtime"   fluticasone 50 MCG/ACT nasal spray Commonly known as: FLONASE Place 2 sprays into both nostrils daily.   gabapentin 100 MG capsule Commonly known as: NEURONTIN Take 100-400 mg by mouth See admin  instructions. Take 100mg  in the AM, 100mg  in the afternoon, and 400mg  at bedtime.   insulin glargine 100 unit/mL Sopn Commonly known as: LANTUS Inject 18 Units into the skin daily.   insulin lispro 100 UNIT/ML KwikPen Commonly known as: HumaLOG KwikPen Inject 0-12 Units into the skin 3 (three) times daily before meals. 80-150 = 8 units, 151-200 = 9 units, 201-250 = 10 units, 251-300 = 11 units, 301-350 = 14 units, 351-400 = 16 units and recheck at next meal, and call MD if multiple readings > 350 in a day.   Insulin Pen Needle 31G X 8 MM Misc Commonly known as: B-D ULTRAFINE III SHORT PEN To use as directed with insulin, four times a day. Dx E11.9, Z79.4   LIDOCAINE EX Apply 1 patch topically daily. Apply to lower back topically in the morning for back pain and remove per schedule   losartan 50 MG tablet Commonly known as: COZAAR Take 1 tablet (50 mg total) by mouth daily.   Multivitamin Adult Chew Chew 1 tablet by mouth daily.   ondansetron 4 MG tablet Commonly known as: ZOFRAN Take 1 tablet (4 mg total) by mouth every 6 (six) hours. What changed:  when to take this reasons to take this   pantoprazole 40 MG tablet Commonly known as: PROTONIX Take 1 tablet (40 mg total) by mouth 2 (two) times daily before a meal. Twice daily for 1 month What changed:  when to take this additional instructions   PRESCRIPTION MEDICATION Take 3 tablets by mouth See admin instructions. Glucose Chewable Tablet - Give 3 tablets by mouth every 15 minutes as needed for blood glucose less than 70. Repeat if blood glucose is still below 100   rosuvastatin 10 MG tablet Commonly known as: Crestor Take 1 tablet (10 mg total) by mouth daily.   solifenacin 10 MG tablet Commonly known as: VESIcare Take 1 tablet (10 mg total) by mouth daily.       Consultations: GI  Procedures/Studies:  DG ERCP  Result Date: 06/14/2023 CLINICAL DATA:  Bile duct stone. EXAM: ERCP TECHNIQUE: Multiple spot  images obtained with the fluoroscopic device and submitted for interpretation post-procedure. FLUOROSCOPY: Radiation Exposure Index (as provided by the fluoroscopic device): 24.95 mGy Kerma COMPARISON:  CT abdomen and pelvis 06/13/2023 FINDINGS: Multiple images were obtained demonstrating the course of the scope and wire through the gastrojejunostomy. Retrograde cholangiogram images demonstrates dilatation of the extrahepatic bile duct. Balloon inflation compatible with a sphincteroplasty. Evidence for a balloon sweep. IMPRESSION: 1. Dilatation of the extrahepatic bile duct. 2. Evidence for balloon sphincteroplasty and balloon sweep. 3. These images were submitted for radiologic interpretation only. Please see the procedural report. Electronically Signed   By: Richarda Overlie M.D.   On: 06/14/2023 08:29   CT CHEST ABDOMEN PELVIS W CONTRAST  Result Date: 06/13/2023 CLINICAL DATA:  Epigastric pain, status post ERCP. EXAM: CT CHEST, ABDOMEN, AND PELVIS WITH CONTRAST TECHNIQUE: Multidetector CT imaging of the chest, abdomen and pelvis was performed following the standard protocol during bolus administration of intravenous contrast. RADIATION DOSE REDUCTION: This exam was performed according to the departmental dose-optimization program which includes automated exposure control, adjustment of the  mA and/or kV according to patient size and/or use of iterative reconstruction technique. CONTRAST:  80mL OMNIPAQUE IOHEXOL 300 MG/ML  SOLN COMPARISON:  04/23/2023. FINDINGS: CT CHEST FINDINGS Cardiovascular: The heart is normal in size and there is no pericardial effusion. Pacemaker leads are noted in the heart. Scattered coronary artery calcifications are noted. There is atherosclerotic calcification of the aorta without evidence of aneurysm. The pulmonary trunk is normal in caliber. Mediastinum/Nodes: No enlarged mediastinal, hilar, or axillary lymph nodes. Thyroid gland, trachea, and esophagus demonstrate no significant  findings. A small hiatal hernia is noted. Lungs/Pleura: Pleural and parenchymal scarring is present bilaterally. Subpleural reticulations and bronchiectasis are noted bilaterally. There is atelectasis in the lower lobes. No effusion or pneumothorax. A 3 mm nodule is noted in the left upper lobe, axial image 82. Musculoskeletal: A pacemaker device is present in the anterior chest wall on the left. Shoulder arthroplasty changes are noted bilaterally. Degenerative changes are present in the thoracic spine. No acute osseous abnormality. CT ABDOMEN PELVIS FINDINGS Hepatobiliary: No focal abnormality is seen in the liver. Intrahepatic and extrahepatic biliary ductal dilatation is present. The common bile duct measures 1.3 cm. Mild pneumobilia is noted. Fat stranding and edema are noted at the porta hepatis. Pancreas: Fatty atrophy of the pancreas. No pancreatic ductal dilatation. There is mild fat stranding in the region of the pancreatic head at the common bile duct. Spleen: Normal in size without focal abnormality. Adrenals/Urinary Tract: The adrenal glands are within normal limits. The kidneys enhance symmetrically. Renal cortical scarring is noted on the right. Renal cysts are present bilaterally. Additional subcentimeter hypodensities are noted which are too small to further characterize. No renal calculus or hydronephrosis. The bladder is unremarkable. Stomach/Bowel: A small hiatal hernia is noted. Gastric surgical changes are present. Appendix appears normal. No evidence of bowel wall thickening, distention, or inflammatory changes. No free air or pneumatosis. A moderate amount of retained stool is present in the colon. Vascular/Lymphatic: Aortic atherosclerosis. No enlarged abdominal or pelvic lymph nodes. Reproductive: Uterus and bilateral adnexa are unremarkable. Other: No abdominopelvic ascites. Musculoskeletal: Degenerative changes in the lumbar spine. No acute osseous abnormality. IMPRESSION: 1. Status post  ERCP with biliary ductal dilatation and pneumobilia. The common bile duct is distended with surrounding fat stranding at the porta hepatis extending into the region of the pancreatic head, possible pancreatitis. Correlation with amylase and lipase is suggested. 2. Gastric surgery changes with small hiatal hernia. 3. 3 mm left upper lobe pulmonary nodule. No follow-up needed if patient is low-risk.This recommendation follows the consensus statement: Guidelines for Management of Incidental Pulmonary Nodules Detected on CT Images: From the Fleischner Society 2017; Radiology 2017; 284:228-243. 4. Aortic atherosclerosis and coronary artery calcifications. Electronically Signed   By: Thornell Sartorius M.D.   On: 06/13/2023 21:07   DG Abd Portable 2V  Result Date: 06/13/2023 CLINICAL DATA:  Abdomen pain following your CT EXAM: PORTABLE ABDOMEN - 2 VIEW COMPARISON:  CT 04/23/2023 FINDINGS: Nonobstructed gas pattern with moderate stool burden. Levoscoliosis. Decubitus view demonstrates no definitive free air. IMPRESSION: Nonobstructed gas pattern with moderate stool burden. Electronically Signed   By: Jasmine Pang M.D.   On: 06/13/2023 18:05   DG CHEST PORT 1 VIEW  Result Date: 06/13/2023 CLINICAL DATA:  Abdomen pain EXAM: PORTABLE CHEST 1 VIEW COMPARISON:  04/22/2018 FINDINGS: Bilateral shoulder replacements. Left-sided pacing device as before. No acute airspace disease or effusion. Normal cardiac size. Aortic atherosclerosis. IMPRESSION: No active disease. Electronically Signed   By: Adrian Prows.D.  On: 06/13/2023 18:04   DG C-Arm 1-60 Min-No Report  Result Date: 06/13/2023 Fluoroscopy was utilized by the requesting physician.  No radiographic interpretation.     Subjective: - no chest pain, shortness of breath, no abdominal pain, nausea or vomiting.   Discharge Exam: BP (!) 146/63 (BP Location: Left Arm)   Pulse 79   Temp 97.6 F (36.4 C) (Oral)   Resp 16   Ht 5\' 3"  (1.6 m)   Wt 51.7 kg   SpO2  96%   BMI 20.19 kg/m   General: Pt is alert, awake, not in acute distress Cardiovascular: RRR, S1/S2 +, no rubs, no gallops Respiratory: CTA bilaterally, no wheezing, no rhonchi Abdominal: Soft, NT, ND, bowel sounds + Extremities: no edema, no cyanosis   The results of significant diagnostics from this hospitalization (including imaging, microbiology, ancillary and laboratory) are listed below for reference.     Microbiology: No results found for this or any previous visit (from the past 240 hour(s)).   Labs: Basic Metabolic Panel: Recent Labs  Lab 06/13/23 1845 06/13/23 2330 06/14/23 0447  NA 135  --  134*  K 4.1  --  3.9  CL 98  --  98  CO2 25  --  27  GLUCOSE 321*  --  203*  BUN 18  --  16  CREATININE 0.74 0.63 0.70  CALCIUM 9.2  --  9.0   Liver Function Tests: Recent Labs  Lab 06/13/23 1845 06/14/23 0447  AST 104* 41  ALT 26 22  ALKPHOS 59 55  BILITOT 1.3* 1.3*  PROT 7.3 6.9  ALBUMIN 4.1 4.0   CBC: Recent Labs  Lab 06/13/23 1845 06/13/23 2330 06/14/23 0447  WBC 17.8* 13.8* 11.6*  HGB 12.7 12.4 12.4  HCT 40.1 38.3 38.2  MCV 99.8 100.5* 99.2  PLT 238 219 229   CBG: Recent Labs  Lab 06/13/23 2007 06/13/23 2107 06/13/23 2226 06/14/23 0732 06/14/23 1132  GLUCAP 309* 275* 226* 196* 306*   Hgb A1c Recent Labs    06/14/23 0447  HGBA1C 7.3*   Lipid Profile No results for input(s): "CHOL", "HDL", "LDLCALC", "TRIG", "CHOLHDL", "LDLDIRECT" in the last 72 hours. Thyroid function studies No results for input(s): "TSH", "T4TOTAL", "T3FREE", "THYROIDAB" in the last 72 hours.  Invalid input(s): "FREET3" Urinalysis    Component Value Date/Time   COLORURINE COLORLESS (A) 04/23/2023 1838   APPEARANCEUR CLEAR 04/23/2023 1838   LABSPEC 1.020 04/23/2023 1838   PHURINE 7.5 04/23/2023 1838   GLUCOSEU NEGATIVE 04/23/2023 1838   HGBUR NEGATIVE 04/23/2023 1838   BILIRUBINUR negative 05/02/2023 1505   KETONESUR negative 05/02/2023 1505   KETONESUR  NEGATIVE 04/23/2023 1838   PROTEINUR negative 05/02/2023 1505   PROTEINUR NEGATIVE 04/23/2023 1838   UROBILINOGEN 0.2 05/02/2023 1505   NITRITE Negative 05/02/2023 1505   NITRITE NEGATIVE 04/23/2023 1838   LEUKOCYTESUR Moderate (2+) (A) 05/02/2023 1505   LEUKOCYTESUR NEGATIVE 04/23/2023 1838    FURTHER DISCHARGE INSTRUCTIONS:   Get Medicines reviewed and adjusted: Please take all your medications with you for your next visit with your Primary MD   Laboratory/radiological data: Please request your Primary MD to go over all hospital tests and procedure/radiological results at the follow up, please ask your Primary MD to get all Hospital records sent to his/her office.   In some cases, they will be blood work, cultures and biopsy results pending at the time of your discharge. Please request that your primary care M.D. goes through all the records of your hospital data  and follows up on these results.   Also Note the following: If you experience worsening of your admission symptoms, develop shortness of breath, life threatening emergency, suicidal or homicidal thoughts you must seek medical attention immediately by calling 911 or calling your MD immediately  if symptoms less severe.   You must read complete instructions/literature along with all the possible adverse reactions/side effects for all the Medicines you take and that have been prescribed to you. Take any new Medicines after you have completely understood and accpet all the possible adverse reactions/side effects.    Do not drive when taking Pain medications or sleeping medications (Benzodaizepines)   Do not take more than prescribed Pain, Sleep and Anxiety Medications. It is not advisable to combine anxiety,sleep and pain medications without talking with your primary care practitioner   Special Instructions: If you have smoked or chewed Tobacco  in the last 2 yrs please stop smoking, stop any regular Alcohol  and or any  Recreational drug use.   Wear Seat belts while driving.   Please note: You were cared for by a hospitalist during your hospital stay. Once you are discharged, your primary care physician will handle any further medical issues. Please note that NO REFILLS for any discharge medications will be authorized once you are discharged, as it is imperative that you return to your primary care physician (or establish a relationship with a primary care physician if you do not have one) for your post hospital discharge needs so that they can reassess your need for medications and monitor your lab values.  Time coordinating discharge: 35 minutes  SIGNED:  Pamella Pert, MD, PhD 06/14/2023, 2:26 PM

## 2023-06-14 NOTE — TOC Transition Note (Signed)
Transition of Care [facility]) - CM/SW Discharge Note   Patient Details  Name: Judith [name] MRN: [id number] Date of Birth: 12/17/1941  Transition of Care [facility] CM/SW Contact:  Amada Jupiter, LCSW Phone Number: 06/14/2023, 3:26 PM   Clinical Narrative:     Alerted by RN that pt has been medically cleared for dc today and to return to her ALF apt at [facility].  Pt and daughter aware and agreeable.  Have spoken with Chip Boer who confirms they only need MD dc summary faxed - done (sent to fax # [phone]).  Daughter will provide dc transportation.  No further TOC needs.   Final next level of care: Assisted Living Barriers to Discharge: No Barriers Identified   Patient Goals and CMS Choice      Discharge Placement                         Discharge Plan and Services Additional resources added to the After Visit Summary for                  DME Arranged: N/A DME Agency: NA                  Social Determinants of Health (SDOH) Interventions SDOH Screenings   Food Insecurity: No Food Insecurity (01/24/2018)  Transportation Needs: No Transportation Needs (01/24/2018)  Utilities: Not At Risk (06/07/2023)  Alcohol Screen: Low Risk  (09/21/2019)  Depression (PHQ2-9): Low Risk  (05/27/2023)  Financial Resource Strain: Low Risk  (01/24/2018)  Physical Activity: Inactive (01/24/2018)  Social Connections: Moderately Isolated (01/24/2018)  Stress: No Stress Concern Present (01/24/2018)  Tobacco Use: Low Risk  (06/13/2023)     Readmission Risk Interventions     No data to display

## 2023-06-17 ENCOUNTER — Encounter (HOSPITAL_COMMUNITY): Payer: Self-pay | Admitting: Gastroenterology

## 2023-06-17 ENCOUNTER — Ambulatory Visit (INDEPENDENT_AMBULATORY_CARE_PROVIDER_SITE_OTHER): Payer: Medicare Other | Admitting: Student

## 2023-06-17 ENCOUNTER — Telehealth: Payer: Self-pay

## 2023-06-17 VITALS — BP 130/82

## 2023-06-17 DIAGNOSIS — R911 Solitary pulmonary nodule: Secondary | ICD-10-CM | POA: Insufficient documentation

## 2023-06-17 DIAGNOSIS — K8050 Calculus of bile duct without cholangitis or cholecystitis without obstruction: Secondary | ICD-10-CM | POA: Diagnosis not present

## 2023-06-17 LAB — CULTURE, BLOOD (ROUTINE X 2)
Culture: NO GROWTH
Special Requests: ADEQUATE

## 2023-06-17 NOTE — Assessment & Plan Note (Signed)
Stable.  S/p cholecystectomy in 2022. -CMP and CBC today to ensure improved leukocytosis and decrease in bilirubin and liver enzymes -Educated patient on low-fat diet and what foods to avoid.  She lives at [place] and daughter has concerns that they would not adhere to this diet, so filled out paperwork to request that they provide her with low-fat foods

## 2023-06-17 NOTE — Progress Notes (Deleted)
    SUBJECTIVE:   CHIEF COMPLAINT / HPI:   Daughter wanted to know about atherosclerosis  CT  Common bile duct  After ERCP, she had a lot of pain that radiated to the back.  Since then, no vomiting  She is finishing 3 days of Augmentin    PERTINENT  PMH / PSH: ***  OBJECTIVE:   There were no vitals taken for this visit. ***   ASSESSMENT/PLAN:   No problem-specific Assessment & Plan notes found for this encounter.   June/July 2025 repeat CT scan (low dose screening lung cancer) for nodule  SLP  Darral Dash, DO [facility]    {    This will disappear when note is signed, click to select method of visit    :1}

## 2023-06-17 NOTE — Assessment & Plan Note (Addendum)
Incidental 3 mm left upper pulmonary nodule seen on CT abdomen/chest during hospitalization. Will monitor with low-dose CT scan in 6 to 12 months

## 2023-06-17 NOTE — Transitions of Care (Post Inpatient/ED Visit) (Signed)
06/17/2023  Name: Judith [name] MRN: [id number] DOB: 05-Mar-1941  Today's TOC FU Call Status: Today's TOC FU Call Status:: Successful TOC FU Call Competed TOC FU Call Complete Date: 06/17/23  Transition Care Management Follow-up Telephone Call Date of Discharge: 06/14/23 Discharge Facility: [facility]) Type of Discharge: Inpatient Admission Primary Inpatient Discharge Diagnosis:: bile duct dilation How have you been since you were released from the hospital?: Better Any questions or concerns?: No  Items Reviewed: Did you receive and understand the discharge instructions provided?: Yes Medications obtained,verified, and reconciled?: Yes (Medications Reviewed) Any new allergies since your discharge?: No Dietary orders reviewed?: Yes Do you have support at home?: Yes People in Home: facility resident  Medications Reviewed Today: Medications Reviewed Today     Reviewed by Karena Addison, LPN (Licensed Practical Nurse) on 06/17/23 at 0845  Med List Status: <None>   Medication Order Taking? Sig Documenting Provider Last Dose Status Informant  acetaminophen (TYLENOL) 500 MG tablet [id number]  Take 1 tablet (500 mg total) by mouth every 6 (six) hours as needed. Darral Dash, DO  Active Nursing Home Medication Administration Guide (MAG)  amoxicillin-clavulanate (AUGMENTIN) 875-125 MG tablet [id number]  Take 1 tablet by mouth 2 (two) times daily for 3 days. Leatha Gilding, MD  Active   Biotin 1000 MCG tablet [id number] No Take 1,000 mcg by mouth daily.  [provider] Taking Active Nursing Home Medication Administration Guide (MAG)  carvedilol (COREG) 12.5 MG tablet [id number]  Take 1 tablet (12.5 mg total) by mouth 2 (two) times daily with a meal. McDiarmid, Leighton Roach, MD  Active Nursing Home Medication Administration Guide (MAG)  cetirizine (ZYRTEC) 10 MG tablet [id number] No Take 10 mg by mouth daily as needed (itching). [provider] Taking Active Nursing Home  Medication Administration Guide (MAG)           Med Note Wandra Scot Apr 24, 2023  7:49 AM)    colchicine 0.6 MG tablet [id number] No Take 0.6 mg by mouth daily. [provider] Taking Active Nursing Home Medication Administration Guide (MAG)  DULoxetine (CYMBALTA) 60 MG capsule [id number] No Take 60 mg by mouth daily. [provider] Taking Active Nursing Home Medication Administration Guide (MAG)  famotidine (PEPCID) 20 MG tablet [id number] No Take 1 tablet (20 mg total) by mouth at bedtime. Lezlie Lye, Meda Coffee, MD Taking Active Nursing Home Medication Administration Guide (MAG)  fluocinonide-emollient (LIDEX-E) 0.05 % cream [id number]  Apply 1 Application topically See admin instructions. "Apply to stomach topically every 24 hours as needed for at bedtime" [provider]  Active Nursing Home Medication Administration Guide (MAG)           Med Note Joella Prince A   Tue Jun 11, 2023  8:54 AM) Sig copied verbatim from [facility]  fluticasone (FLONASE) 50 MCG/ACT nasal spray [id number] No Place 2 sprays into both nostrils daily. Darral Dash, DO Taking Active Nursing Home Medication Administration Guide (MAG)  gabapentin (NEURONTIN) 100 MG capsule [id number] No Take 100-400 mg by mouth See admin instructions. Take 100mg  in the AM, 100mg  in the afternoon, and 400mg  at bedtime. [provider] Taking Active Nursing Home Medication Administration Guide (MAG)  insulin glargine (LANTUS) 100 unit/mL SOPN [id number]  Inject 18 Units into the skin daily. Alfredo Martinez, MD  Active Nursing Home Medication Administration Guide (MAG)  insulin lispro (HUMALOG KWIKPEN) 100 UNIT/ML KwikPen [id number]  Inject 0-12 Units into the skin 3 (three) times daily before meals. 80-150 =  8 units, 151-200 = 9 units, 201-250 = 10 units, 251-300 = 11 units, 301-350 = 14 units, 351-400 = 16 units and recheck at next meal, and call MD if multiple readings > 350 in a day. Darral Dash, DO   Active Nursing Home Medication Administration Guide (MAG)  Insulin Pen Needle (B-D ULTRAFINE III SHORT PEN) 31G X 8 MM MISC [id number] No To use as directed with insulin, four times a day. Dx E11.9, Z79.4 Lezlie Lye, Meda Coffee, MD Taking Active Nursing Home Medication Administration Guide (MAG)  LIDOCAINE [place] [id number]  Apply 1 patch topically daily. Apply to lower back topically in the morning for back pain and remove per schedule [provider]  Active Nursing Home Medication Administration Guide (MAG)           Med Note Sherrie Mustache, [place] A   Tue Jun 11, 2023  9:02 AM) No % for patch listed on MAR  losartan (COZAAR) 50 MG tablet [id number] No Take 1 tablet (50 mg total) by mouth daily. Moses Manners, MD Taking Active Nursing Home Medication Administration Guide (MAG)  Multiple Vitamins-Minerals (MULTIVITAMIN ADULT) CHEW [id number]  Chew 1 tablet by mouth daily. [provider]  Active Nursing Home Medication Administration Guide (MAG)  ondansetron (ZOFRAN) 4 MG tablet [id number] No Take 1 tablet (4 mg total) by mouth every 6 (six) hours.  Patient taking differently: Take 4 mg by mouth every 6 (six) hours as needed for vomiting or nausea.   Maury Dus, MD Taking Active Nursing Home Medication Administration Guide (MAG)  pantoprazole (PROTONIX) 40 MG tablet [id number]  Take 1 tablet (40 mg total) by mouth 2 (two) times daily before a meal. Twice daily for 1 month Mansouraty, Netty Starring., MD  Active   PRESCRIPTION MEDICATION [id number]  Take 3 tablets by mouth See admin instructions. Glucose Chewable Tablet - Give 3 tablets by mouth every 15 minutes as needed for blood glucose less than 70. Repeat if blood glucose is still below 100 [provider]  Active Nursing Home Medication Administration Guide (MAG)           Med Note Sherrie Mustache, [place] A   Tue Jun 11, 2023  8:58 AM) No strength listed on MAR   rosuvastatin (CRESTOR) 10 MG tablet [id number] No Take 1 tablet (10 mg total) by  mouth daily. Just, Azalee Course, FNP Taking Active Nursing Home Medication Administration Guide (MAG)  solifenacin (VESICARE) 10 MG tablet [id number] No Take 1 tablet (10 mg total) by mouth daily. Darral Dash, DO Taking Active Nursing Home Medication Administration Guide (MAG)  Med List Note Nolon Bussing, CPhT 04/03/22 1341): Patient is a resident of [facility] [phone]            Home Care and Equipment/Supplies: Were Home Health Services Ordered?: NA Any new equipment or medical supplies ordered?: NA  Functional Questionnaire: Do you need assistance with bathing/showering or dressing?: No Do you need assistance with meal preparation?: Yes Do you need assistance with eating?: No Do you have difficulty maintaining continence: No Do you need assistance with getting out of bed/getting out of a chair/moving?: No Do you have difficulty managing or taking your medications?: No  Follow up appointments reviewed: PCP Follow-up appointment confirmed?: Yes Date of PCP follow-up appointment?: 06/17/23 Follow-up Provider: [facility] Follow-up appointment confirmed?: No Reason Specialist Follow-Up Not Confirmed: Patient has Specialist Provider Number and will Call for Appointment Do you need transportation to your follow-up appointment?: No Do you understand care  options if your condition(s) worsen?: Yes-patient verbalized understanding    SIGNATURE Karena Addison, LPN [facility] Nurse Health Advisor Direct Dial [phone]

## 2023-06-17 NOTE — Patient Instructions (Addendum)
It was great seeing you today.  As we discussed, - We are checking your labs today. I will call if anything is abnormal. - Avoid fried foods, potato chips,  - Continue taking your medications as we have them    If you have any questions or concerns, please feel free to call the clinic.   Have a wonderful day,  Dr. Darral Dash [facility] [phone]

## 2023-06-17 NOTE — Progress Notes (Signed)
    SUBJECTIVE:   CHIEF COMPLAINT / HPI:   Judith [name] is an 82 year old female here for follow up from hospitalization for ERCP given repeated episodes of choledocholithiasis.  The ERCP was difficult due to anatomy and lasted approximately 2 hours.  She reported epigastric pain rating to the back afterward, with concern for possible pancreatitis although lipase and amylase were within normal limits and her pain has since resolved.  She is completing a 3-day course of Augmentin.  She was told to adhere to a low-fat diet and had questions about which foods are safe to eat and a low-fat diet  Daughter had questions about the CT scan which showed a small 3 mm pulmonary nodule.  Patient is a never smoker and denies cough, or any other concerning symptoms.  PERTINENT  PMH / PSH: T2DM  OBJECTIVE:   Today's Vitals   06/17/23 1649  BP: 130/82  SpO2: 99%    General: Pleasant, elderly, no distress CV: Regular rate rhythm Respiratory: Normal work of breathing on room air Abdomen: Midline well-healed surgical scar, soft and nontender throughout  ASSESSMENT/PLAN:   Pulmonary nodule Incidental 3 mm left upper pulmonary nodule seen on CT abdomen/chest during hospitalization. Will monitor with low-dose CT scan in 6 to 12 months  Choledocholithiasis Stable.  S/p cholecystectomy in 2022. -CMP and CBC today to ensure improved leukocytosis and decrease in bilirubin and liver enzymes -Educated patient on low-fat diet and what foods to avoid.  She lives at [place] and daughter has concerns that they would not adhere to this diet, so filled out paperwork to request that they provide her with low-fat foods     Judith Dash, DO [facility]

## 2023-06-18 LAB — COMPREHENSIVE METABOLIC PANEL
ALT: 11 IU/L (ref 0–32)
AST: 21 IU/L (ref 0–40)
Albumin: 3.7 g/dL (ref 3.7–4.7)
Alkaline Phosphatase: 58 IU/L (ref 44–121)
BUN/Creatinine Ratio: 17 (ref 12–28)
BUN: 14 mg/dL (ref 8–27)
Bilirubin Total: 0.4 mg/dL (ref 0.0–1.2)
CO2: 22 mmol/L (ref 20–29)
Calcium: 9.1 mg/dL (ref 8.7–10.3)
Chloride: 99 mmol/L (ref 96–106)
Creatinine, Ser: 0.82 mg/dL (ref 0.57–1.00)
Globulin, Total: 2.3 g/dL (ref 1.5–4.5)
Glucose: 215 mg/dL — ABNORMAL HIGH (ref 70–99)
Potassium: 4.5 mmol/L (ref 3.5–5.2)
Sodium: 135 mmol/L (ref 134–144)
Total Protein: 6 g/dL (ref 6.0–8.5)
eGFR: 71 mL/min/{1.73_m2} (ref >59)

## 2023-06-18 LAB — CBC
Hematocrit: 35.1 % (ref 34.0–46.6)
Hemoglobin: 11.3 g/dL (ref 11.1–15.9)
MCH: 31.6 pg (ref 26.6–33.0)
MCHC: 32.2 g/dL (ref 31.5–35.7)
MCV: 98 fL — ABNORMAL HIGH (ref 79–97)
Platelets: 175 10*3/uL (ref 150–450)
RBC: 3.58 x10E6/uL — ABNORMAL LOW (ref 3.77–5.28)
RDW: 11.8 % (ref 11.7–15.4)
WBC: 9.3 10*3/uL (ref 3.4–10.8)

## 2023-06-19 LAB — CULTURE, BLOOD (ROUTINE X 2)
Culture: NO GROWTH
Special Requests: ADEQUATE

## 2023-07-29 ENCOUNTER — Ambulatory Visit: Payer: Medicare Other

## 2023-07-29 DIAGNOSIS — I442 Atrioventricular block, complete: Secondary | ICD-10-CM

## 2023-07-29 LAB — CUP PACEART REMOTE DEVICE CHECK
Battery Remaining Longevity: 84 mo
Battery Remaining Percentage: 100 %
Brady Statistic RA Percent Paced: 2 %
Brady Statistic RV Percent Paced: 100 %
Date Time Interrogation Session: 20240805003100
Implantable Lead Connection Status: 753985
Implantable Lead Connection Status: 753985
Implantable Lead Implant Date: 20070701
Implantable Lead Implant Date: 20110617
Implantable Lead Location: 753859
Implantable Lead Location: 753860
Implantable Lead Model: 4136
Implantable Lead Model: 4469
Implantable Lead Serial Number: 28708376
Implantable Lead Serial Number: 484167
Implantable Pulse Generator Implant Date: 20240502
Lead Channel Impedance Value: 654 Ohm
Lead Channel Impedance Value: 691 Ohm
Lead Channel Pacing Threshold Amplitude: 0.5 V
Lead Channel Pacing Threshold Amplitude: 0.8 V
Lead Channel Pacing Threshold Pulse Width: 0.4 ms
Lead Channel Pacing Threshold Pulse Width: 0.4 ms
Lead Channel Setting Pacing Amplitude: 3 V
Lead Channel Setting Pacing Amplitude: 3 V
Lead Channel Setting Pacing Pulse Width: 0.4 ms
Lead Channel Setting Sensing Sensitivity: 3.5 mV
Pulse Gen Serial Number: 763022
Zone Setting Status: 755011

## 2023-08-01 ENCOUNTER — Ambulatory Visit: Payer: Medicare Other | Admitting: Cardiovascular Disease

## 2023-08-01 ENCOUNTER — Encounter: Payer: Self-pay | Admitting: Cardiovascular Disease

## 2023-08-01 VITALS — BP 117/60 | HR 39 | Ht 63.0 in | Wt 113.0 lb

## 2023-08-01 DIAGNOSIS — Z794 Long term (current) use of insulin: Secondary | ICD-10-CM

## 2023-08-01 DIAGNOSIS — Z950 Presence of cardiac pacemaker: Secondary | ICD-10-CM | POA: Diagnosis not present

## 2023-08-01 DIAGNOSIS — I442 Atrioventricular block, complete: Secondary | ICD-10-CM

## 2023-08-01 DIAGNOSIS — E1140 Type 2 diabetes mellitus with diabetic neuropathy, unspecified: Secondary | ICD-10-CM | POA: Diagnosis not present

## 2023-08-01 DIAGNOSIS — I10 Essential (primary) hypertension: Secondary | ICD-10-CM

## 2023-08-01 DIAGNOSIS — E7800 Pure hypercholesterolemia, unspecified: Secondary | ICD-10-CM

## 2023-08-01 NOTE — Progress Notes (Signed)
Cardiology office note:    Date:  08/01/2023   ID:  Judith [name], DOB [birth year], MRN [id number]  PCP:  Darral Dash, DO  Cardiologist:  Thurmon Fair, MD    Referring MD: Darral Dash, DO   Chief complaint: pacemaker check  History of Present Illness:    Judith [name] is a 82 y.o. female with a hx of pacemaker implantation in [place].  She has complete heart block and is pacemaker dependent.  She underwent pacemaker generator change out earlier 04/25/2023.  Her new device is a Engineer, production, but her old leads (atrial Midwife [phone] and ventricular lead [employer] [phone] are not MRI compatible).  This is her third generator since initial device implantation in 2007.  The surgical site has healed well, although the lateral edge of the device does look a little prominent.  The overlying skin is healed very well, without redness, tenderness or swelling.  The patient specifically denies any chest pain at rest exertion, dyspnea at rest or with exertion, orthopnea, paroxysmal nocturnal dyspnea, syncope, palpitations, focal neurological deficits, intermittent claudication, lower extremity edema, unexplained weight gain, cough, hemoptysis or wheezing.  She is a resident at [employer].  She is pacemaker dependent.  There are no sensed R waves.  She has an estimated device longevity of 8 years.  There is 2% atrial pacing and 100% ventricular pacing.  She has not had any atrial fibrillation or high ventricular rates.  There are very rare spells of pacemaker mediated tachycardia.  Her initial pacemaker was implanted in 2007.  It appears that her ventricular lead is still the original one (Guidant [id number] implanted July 05, 2006).  June 09, 2010 she underwent a pacemaker generator change out and placement of a new atrial lead (Guidant [id number]).   She does not have a history of coronary disease.  She reports a normal stress test in the past.  She had  an echocardiogram just a few months before coming to [place] and was told that her heart pumping strength is normal.  She was told that she has a murmur which is due to a mildly leaky valve.  There is no reported history of heart failure that I am aware of.   She does not have intermittent claudication and reports that lower extremity ABIs have been performed in the past with normal results.  She has never had a stroke or TIA.  She was taking lovastatin for lipid lowering but has been off this medication for a while.  She has lost a lot of weight since then.  She was hospitalized 11/28/2021 with coledocholithiasis, had ERCP/biliary stent 11/30/2021 and lap cholecystectomy 12/06/2021,  ERCP 02/01/2022 for stent removal.  She had a fall (tripped over a rug) complicated by L1 vertebral fracture on 01/07/2023 which is being managed conservatively, with physical therapy.  She did have head impact, but no intracranial bleeding.  Imaging studies incidentally showed presence of aortic atherosclerosis, coronary atherosclerosis and mitral valve calcification.  She is recovering pretty well from the fall and is walking fairly briskly today.  Past Medical History:  Diagnosis Date   Anemia    Arthritis    Biliary gastritis    Blood transfusion without reported diagnosis    2007   Cervical spine arthritis 04/05/2016   Choledocholithiasis    Controlled type 2 diabetes mellitus with diabetic neuropathy, with long-term current use of insulin (HCC)    Depression    Diabetes mellitus type 2 with  complications (HCC)    Dilated bile duct    Duodenal ulcer disease    Encounter for long-term (current) use of insulin (HCC)    Gallstones 01/17/2018   Gastric ulcer    Gastritis and gastroduodenitis    Gastroparesis    severe   GERD (gastroesophageal reflux disease)    Gout    tested for gout but was not   Heart murmur    Hiatal hernia    Hyperlipidemia    in past not now   Hypertension    Hypokalemia,  gastrointestinal losses    secondary to severe gastroparesis   Internal hemorrhoids    Long-term insulin use in type 2 diabetes (HCC) 04/05/2016   Osteoporosis    just had bone density test possible    Peripheral neuropathy    Presence of cardiac pacemaker    S/P partial gastrectomy    due to severe gastric and gudoenal ulcers   Seasonal allergies    Sleep apnea    could not use CPAP   UTI (urinary tract infection)     Past Surgical History:  Procedure Laterality Date   BILIARY DILATION  02/01/2022   Procedure: BILIARY DILATION;  Surgeon: Lemar Lofty., MD;  Location: [facility] ENDOSCOPY;  Service: Gastroenterology;;   BILIARY DILATION  04/09/2022   Procedure: BILIARY DILATION;  Surgeon: Lemar Lofty., MD;  Location: [facility] ENDOSCOPY;  Service: Gastroenterology;;   BILIARY DILATION  06/13/2023   Procedure: BILIARY DILATION;  Surgeon: Lemar Lofty., MD;  Location: [facility] ENDOSCOPY;  Service: Gastroenterology;;   BILIARY STENT PLACEMENT N/A 11/30/2021   Procedure: BILIARY STENT PLACEMENT;  Surgeon: Lemar Lofty., MD;  Location: [facility] ENDOSCOPY;  Service: Gastroenterology;  Laterality: N/A;   BILIARY STENT PLACEMENT  02/01/2022   Procedure: BILIARY STENT PLACEMENT;  Surgeon: Meridee Score Netty Starring., MD;  Location: [facility] ENDOSCOPY;  Service: Gastroenterology;;   BIOPSY  11/30/2021   Procedure: BIOPSY;  Surgeon: Lemar Lofty., MD;  Location: [facility] ENDOSCOPY;  Service: Gastroenterology;;   BIOPSY  04/09/2022   Procedure: BIOPSY;  Surgeon: Lemar Lofty., MD;  Location: [facility] ENDOSCOPY;  Service: Gastroenterology;;   CHOLECYSTECTOMY N/A 12/06/2021   Procedure: LAPAROSCOPIC CHOLECYSTECTOMY, LYSIS OF ADHESIONS;  Surgeon: Harriette Bouillon, MD;  Location: [facility] ORS;  Service: General;  Laterality: N/A;   COLONOSCOPY  02/11/2018   no polyps, + small internal hemorrhoids   ENDOSCOPIC RETROGRADE CHOLANGIOPANCREATOGRAPHY (ERCP) WITH PROPOFOL N/A 11/30/2021   Procedure:  ENDOSCOPIC RETROGRADE CHOLANGIOPANCREATOGRAPHY (ERCP) WITH PROPOFOL;  Surgeon: Lemar Lofty., MD;  Location: [facility] ENDOSCOPY;  Service: Gastroenterology;  Laterality: N/A;   ENDOSCOPIC RETROGRADE CHOLANGIOPANCREATOGRAPHY (ERCP) WITH PROPOFOL N/A 02/01/2022   Procedure: ENDOSCOPIC RETROGRADE CHOLANGIOPANCREATOGRAPHY (ERCP) WITH PROPOFOL;  Surgeon: Meridee Score Netty Starring., MD;  Location: [facility] ENDOSCOPY;  Service: Gastroenterology;  Laterality: N/A;   ENDOSCOPIC RETROGRADE CHOLANGIOPANCREATOGRAPHY (ERCP) WITH PROPOFOL N/A 04/09/2022   Procedure: ENDOSCOPIC RETROGRADE CHOLANGIOPANCREATOGRAPHY (ERCP) WITH PROPOFOL;  Surgeon: Meridee Score Netty Starring., MD;  Location: [facility] ENDOSCOPY;  Service: Gastroenterology;  Laterality: N/A;   ENDOSCOPIC RETROGRADE CHOLANGIOPANCREATOGRAPHY (ERCP) WITH PROPOFOL N/A 06/13/2023   Procedure: ENDOSCOPIC RETROGRADE CHOLANGIOPANCREATOGRAPHY (ERCP) WITH PROPOFOL;  Surgeon: Meridee Score Netty Starring., MD;  Location: [facility] ENDOSCOPY;  Service: Gastroenterology;  Laterality: N/A;   ESOPHAGOGASTRODUODENOSCOPY (EGD) WITH PROPOFOL N/A 11/30/2021   Procedure: ESOPHAGOGASTRODUODENOSCOPY (EGD) WITH PROPOFOL;  Surgeon: Meridee Score Netty Starring., MD;  Location: [facility] ENDOSCOPY;  Service: Gastroenterology;  Laterality: N/A;   EUS N/A 12/12/2017   Procedure: UPPER ENDOSCOPIC ULTRASOUND (EUS) RADIAL;  Surgeon: Rachael Fee, MD;  Location: [facility] ENDOSCOPY;  Service: Endoscopy;  Laterality: N/A;   GASTRECTOMY     JOINT REPLACEMENT     pace maker     PARTIAL GASTRECTOMY     PPM GENERATOR CHANGEOUT N/A 04/25/2023   Procedure: PPM GENERATOR CHANGEOUT;  Surgeon: Thurmon Fair, MD;  Location: [facility] INVASIVE CV LAB;  Service: Cardiovascular;  Laterality: N/A;   REMOVAL OF STONES  02/01/2022   Procedure: REMOVAL OF STONES;  Surgeon: Meridee Score Netty Starring., MD;  Location: [facility] ENDOSCOPY;  Service: Gastroenterology;;   REMOVAL OF STONES  04/09/2022   Procedure: REMOVAL OF STONES;  Surgeon: Lemar Lofty., MD;   Location: [facility] ENDOSCOPY;  Service: Gastroenterology;;   REMOVAL OF STONES  06/13/2023   Procedure: REMOVAL OF STONES;  Surgeon: Lemar Lofty., MD;  Location: [facility] ENDOSCOPY;  Service: Gastroenterology;;   Dennison Mascot  11/30/2021   Procedure: Dennison Mascot;  Surgeon: Lemar Lofty., MD;  Location: [facility] ENDOSCOPY;  Service: Gastroenterology;;   Francine Graven REMOVAL  02/01/2022   Procedure: STENT REMOVAL;  Surgeon: Lemar Lofty., MD;  Location: [facility] ENDOSCOPY;  Service: Gastroenterology;;   Francine Graven REMOVAL  04/09/2022   Procedure: STENT REMOVAL;  Surgeon: Lemar Lofty., MD;  Location: [facility] ENDOSCOPY;  Service: Gastroenterology;;   SUBMUCOSAL TATTOO INJECTION  11/30/2021   Procedure: SUBMUCOSAL TATTOO INJECTION;  Surgeon: Lemar Lofty., MD;  Location: [facility] ENDOSCOPY;  Service: Gastroenterology;;   TONSILLECTOMY     45 years ago   UPPER GASTROINTESTINAL ENDOSCOPY      Current Medications: Current Meds  Medication Sig   acetaminophen (TYLENOL) 500 MG tablet Take 1 tablet (500 mg total) by mouth every 6 (six) hours as needed.   Biotin 1000 MCG tablet Take 1,000 mcg by mouth daily.    carvedilol (COREG) 12.5 MG tablet Take 1 tablet (12.5 mg total) by mouth 2 (two) times daily with a meal.   colchicine 0.6 MG tablet Take 0.6 mg by mouth daily.   DULoxetine (CYMBALTA) 60 MG capsule Take 60 mg by mouth daily.   famotidine (PEPCID) 20 MG tablet Take 1 tablet (20 mg total) by mouth at bedtime.   fluticasone (FLONASE) 50 MCG/ACT nasal spray Place 2 sprays into both nostrils daily.   gabapentin (NEURONTIN) 100 MG capsule Take 100-400 mg by mouth See admin instructions. Take 100mg  in the AM, 100mg  in the afternoon, and 400mg  at bedtime.   insulin glargine (LANTUS) 100 unit/mL SOPN Inject 18 Units into the skin daily.   insulin lispro (HUMALOG KWIKPEN) 100 UNIT/ML KwikPen Inject 0-12 Units into the skin 3 (three) times daily before meals. 80-150 = 8 units, 151-200 = 9 units,  201-250 = 10 units, 251-300 = 11 units, 301-350 = 14 units, 351-400 = 16 units and recheck at next meal, and call MD if multiple readings > 350 in a day.   Insulin Pen Needle (B-D ULTRAFINE III SHORT PEN) 31G X 8 MM MISC To use as directed with insulin, four times a day. Dx E11.9, Z79.4   LIDOCAINE EX Apply 1 patch topically daily. Apply to lower back topically in the morning for back pain and remove per schedule   losartan (COZAAR) 50 MG tablet Take 1 tablet (50 mg total) by mouth daily.   Multiple Vitamins-Minerals (MULTIVITAMIN ADULT) CHEW Chew 1 tablet by mouth daily.   PRESCRIPTION MEDICATION Take 3 tablets by mouth See admin instructions. Glucose Chewable Tablet - Give 3 tablets by mouth every 15 minutes as needed for blood glucose less than 70. Repeat if blood glucose is still below 100  rosuvastatin (CRESTOR) 10 MG tablet Take 1 tablet (10 mg total) by mouth daily.   solifenacin (VESICARE) 10 MG tablet Take 1 tablet (10 mg total) by mouth daily.     Allergies:   Asa [aspirin], Ibuprofen, Ciprofloxacin, and Sulfa antibiotics   Social History   Socioeconomic History   Marital status: Widowed    Spouse name: Not on file   Number of children: 3   Years of education: Not on file   Highest education level: High school graduate  Occupational History   Not on file  Tobacco Use   Smoking status: Never   Smokeless tobacco: Never  Vaping Use   Vaping status: Never Used  Substance and Sexual Activity   Alcohol use: No    Alcohol/week: 0.0 standard drinks of alcohol   Drug use: No   Sexual activity: Not Currently  Other Topics Concern   Not on file  Social History Narrative   Used to live independently in [place]   However had significant issues with severe gastroparesis resulting in severe hypokalemia and general weakness and fall resulting in hospitalization and SNF rehab   Now (10/2017) living with her daughter in [place] until able to stabilize and regain independence.      May 2019    Patient living in assisted living, [place]   Social Determinants of Health   Financial Resource Strain: Low Risk  (01/24/2018)   Overall Financial Resource Strain (CARDIA)    Difficulty of Paying Living Expenses: Not hard at all  Food Insecurity: No Food Insecurity (01/24/2018)   Hunger Vital Sign    Worried About Running Out of Food in the Last Year: Never true    Ran Out of Food in the Last Year: Never true  Transportation Needs: No Transportation Needs (01/24/2018)   PRAPARE - Administrator, Civil Service (Medical): No    Lack of Transportation (Non-Medical): No  Physical Activity: Inactive (01/24/2018)   Exercise Vital Sign    Days of Exercise per Week: 0 days    Minutes of Exercise per Session: 0 min  Stress: No Stress Concern Present (01/24/2018)   [employer] of Occupational Health - Occupational Stress Questionnaire    Feeling of Stress : Not at all  Social Connections: Moderately Isolated (01/24/2018)   Social Connection and Isolation Panel [NHANES]    Frequency of Communication with Friends and Family: Once a week    Frequency of Social Gatherings with Friends and Family: Once a week    Attends Religious Services: More than 4 times per year    Active Member of [employer] or Organizations: No    Attends Banker Meetings: Never    Marital Status: Widowed     Family History: The patient's family history significant for the absence of premature cardiac illness  ROS:   Please see the history of present illness.    All other systems are reviewed and are negative.   EKGs/Labs/Other Studies Reviewed:    The following studies were reviewed today: Comprehensive pacemaker interrogation including threshold testing on both atrial and ventricular leads was performed today.  All parameters are normal.  EKG:  EKG is not ordered today.  The most recent tracing from 04/23/2023 shows atrial sensed (sinus), ventricular paced rhythm Recent Labs: 06/17/2023: ALT  11; BUN 14; Creatinine, Ser 0.82; Hemoglobin 11.3; Platelets 175; Potassium 4.5; Sodium 135  11/29/2021 A1c 7.9% Recent Lipid Panel    Component Value Date/Time   CHOL 140 02/14/2021 1017   TRIG  95 02/14/2021 1017   HDL 56 02/14/2021 1017   CHOLHDL 2.5 02/14/2021 1017   LDLCALC 66 02/14/2021 1017    Physical Exam:    VS:  BP 117/60   Pulse (!) 39   Ht 5\' 3"  (1.6 m)   Wt 113 lb (51.3 kg)   SpO2 99%   BMI 20.02 kg/m     Wt Readings from Last 3 Encounters:  08/01/23 113 lb (51.3 kg)  06/13/23 114 lb (51.7 kg)  05/27/23 114 lb 6.4 oz (51.9 kg)     General: Alert, oriented x3, no distress, healthy left subclavian pacemaker site, while there is some fat atrophy and the lateral border of the device is a little prominent, but the overlying skin appears healthy and is not thin, red or adherent to the device Head: no evidence of trauma, PERRL, EOMI, no exophtalmos or lid lag, no myxedema, no xanthelasma; normal ears, nose and oropharynx Neck: normal jugular venous pulsations and no hepatojugular reflux; brisk carotid pulses without delay and no carotid bruits Chest: clear to auscultation, no signs of consolidation by percussion or palpation, normal fremitus, symmetrical and full respiratory excursions Cardiovascular: normal position and quality of the apical impulse, regular rhythm, normal first and second heart sounds, no murmurs, rubs or gallops Abdomen: no tenderness or distention, no masses by palpation, no abnormal pulsatility or arterial bruits, normal bowel sounds, no hepatosplenomegaly Extremities: no clubbing, cyanosis or edema; 2+ radial, ulnar and brachial pulses bilaterally; 2+ right femoral, posterior tibial and dorsalis pedis pulses; 2+ left femoral, posterior tibial and dorsalis pedis pulses; no subclavian or femoral bruits Neurological: grossly nonfocal Psych: Normal mood and affect    ASSESSMENT:    1. CHB (complete heart block) (HCC)   2. Pacemaker   3. Controlled  type 2 diabetes mellitus with diabetic neuropathy, with long-term current use of insulin (HCC)   4. Hypercholesterolemia   5. Essential hypertension       PLAN:    In order of problems listed above:  CHB: Pacemaker dependent, does not have escape rhythm.  Well-healed following device change out in May 2024. PPM: Normal device function.  No episodes of atrial fibrillation or high ventricular reported.  Continue remote downloads every 3 months. DM: Glycemic control with a hemoglobin A1c of 7.3% which is quite acceptable at her age.   HLP: Most recent lipid parameters are within target range as well.  Continue rosuvastatin. HTN: Good control.    Medication Adjustments/Labs and Tests Ordered: Current medicines are reviewed at length with the patient today.  Concerns regarding medicines are outlined above.  No orders of the defined types were placed in this encounter.  No orders of the defined types were placed in this encounter.   Patient Instructions  Medication Instructions:   Your physician recommends that you continue on your current medications as directed. Please refer to the Current Medication list given to you today.   *If you need a refill on your cardiac medications before your next appointment, please call your pharmacy*   Lab Work: NONE ORDERED  TODAY    If you have labs (blood work) drawn today and your tests are completely normal, you will receive your results only by: MyChart Message (if you have MyChart) OR A paper copy in the mail If you have any lab test that is abnormal or we need to change your treatment, we will call you to review the results.   Testing/Procedures: NONE ORDERED  TODAY     Follow-Up: At  [facility], you and your health needs are our priority.  As part of our continuing mission to provide you with exceptional heart care, we have created designated Provider Care Teams.  These Care Teams include your primary Cardiologist  (physician) and Advanced Practice Providers (APPs -  Physician Assistants and Nurse Practitioners) who all work together to provide you with the care you need, when you need it.  We recommend signing up for the patient portal called "MyChart".  Sign up information is provided on this After Visit Summary.  MyChart is used to connect with patients for Virtual Visits (Telemedicine).  Patients are able to view lab/test results, encounter notes, upcoming appointments, etc.  Non-urgent messages can be sent to your provider as well.   To learn more about what you can do with MyChart, go to [email or web address].    Your next appointment:   1 year(s)  Provider:   Thurmon Fair, MD     Other Instructions     Signed, Thurmon Fair, MD  08/01/2023 1:31 PM    [facility]

## 2023-08-01 NOTE — Patient Instructions (Signed)
Medication Instructions:   Your physician recommends that you continue on your current medications as directed. Please refer to the Current Medication list given to you today.   *If you need a refill on your cardiac medications before your next appointment, please call your pharmacy*   Lab Work: NONE ORDERED  TODAY    If you have labs (blood work) drawn today and your tests are completely normal, you will receive your results only by: MyChart Message (if you have MyChart) OR A paper copy in the mail If you have any lab test that is abnormal or we need to change your treatment, we will call you to review the results.   Testing/Procedures: NONE ORDERED  TODAY     Follow-Up: At [facility], you and your health needs are our priority.  As part of our continuing mission to provide you with exceptional heart care, we have created designated Provider Care Teams.  These Care Teams include your primary Cardiologist (physician) and Advanced Practice Providers (APPs -  Physician Assistants and Nurse Practitioners) who all work together to provide you with the care you need, when you need it.  We recommend signing up for the patient portal called "MyChart".  Sign up information is provided on this After Visit Summary.  MyChart is used to connect with patients for Virtual Visits (Telemedicine).  Patients are able to view lab/test results, encounter notes, upcoming appointments, etc.  Non-urgent messages can be sent to your provider as well.   To learn more about what you can do with MyChart, go to [email or web address].    Your next appointment:   1 year(s)  Provider:   Thurmon Fair, MD     Other Instructions

## 2023-08-06 ENCOUNTER — Encounter (INDEPENDENT_AMBULATORY_CARE_PROVIDER_SITE_OTHER): Payer: Medicare Other | Admitting: Ophthalmology

## 2023-08-13 NOTE — Progress Notes (Signed)
Remote pacemaker transmission.   

## 2023-08-15 ENCOUNTER — Other Ambulatory Visit: Payer: Commercial Managed Care - HMO

## 2023-08-15 ENCOUNTER — Encounter (INDEPENDENT_AMBULATORY_CARE_PROVIDER_SITE_OTHER): Payer: Medicare Other | Admitting: Ophthalmology

## 2023-08-16 ENCOUNTER — Telehealth: Payer: Self-pay

## 2023-08-16 NOTE — Telephone Encounter (Signed)
Lupita Leash nurse with [facility] LVM on nurse line requesting an order for a UA.   She reports the patient has been complaining of abdominal pain.   I called Lupita Leash back, however was told she had left for the day. I was told there is no other clinical staff in at the moment. She asks I call back on Monday.

## 2023-08-16 NOTE — Telephone Encounter (Signed)
[name] patients daughter calls nurse line in regards to concern.   She reports she is at the facility now. She reports she is speaking with the director on how to resolve the issue since clinical staff are not present.   Advised I could send an order for a UA and Culture, however they will have no one to read results and/or treat.   Patient is experiencing dysuria, noted blood after she urinates and abdominal pain.   She denies any fevers or chills.   Advised [name] to take her to UC.  She agreed with plan.

## 2023-08-17 ENCOUNTER — Encounter: Payer: Self-pay | Admitting: Student

## 2023-08-19 ENCOUNTER — Emergency Department (HOSPITAL_COMMUNITY): Payer: Medicare Other

## 2023-08-19 ENCOUNTER — Encounter (HOSPITAL_COMMUNITY): Payer: Self-pay

## 2023-08-19 ENCOUNTER — Emergency Department (HOSPITAL_COMMUNITY)
Admission: EM | Admit: 2023-08-19 | Discharge: 2023-08-19 | Disposition: A | Discharge status: Home / Self Care | Payer: Medicare Other | Attending: Emergency Medicine | Admitting: Emergency Medicine

## 2023-08-19 DIAGNOSIS — K3184 Gastroparesis: Secondary | ICD-10-CM

## 2023-08-19 DIAGNOSIS — Z794 Long term (current) use of insulin: Secondary | ICD-10-CM | POA: Diagnosis not present

## 2023-08-19 DIAGNOSIS — R112 Nausea with vomiting, unspecified: Secondary | ICD-10-CM | POA: Diagnosis present

## 2023-08-19 DIAGNOSIS — E1143 Type 2 diabetes mellitus with diabetic autonomic (poly)neuropathy: Secondary | ICD-10-CM | POA: Diagnosis not present

## 2023-08-19 DIAGNOSIS — Z950 Presence of cardiac pacemaker: Secondary | ICD-10-CM | POA: Insufficient documentation

## 2023-08-19 LAB — CBC WITH DIFFERENTIAL/PLATELET
Abs Immature Granulocytes: 0.05 10*3/uL (ref 0.00–0.07)
Basophils Absolute: 0 10*3/uL (ref 0.0–0.1)
Basophils Relative: 0 %
Eosinophils Absolute: 0 10*3/uL (ref 0.0–0.5)
Eosinophils Relative: 0 %
HCT: 42.3 % (ref 36.0–46.0)
Hemoglobin: 13.7 g/dL (ref 12.0–15.0)
Immature Granulocytes: 0 %
Lymphocytes Relative: 10 %
Lymphs Abs: 1.3 10*3/uL (ref 0.7–4.0)
MCH: 31.1 pg (ref 26.0–34.0)
MCHC: 32.4 g/dL (ref 30.0–36.0)
MCV: 95.9 fL (ref 80.0–100.0)
Monocytes Absolute: 0.3 10*3/uL (ref 0.1–1.0)
Monocytes Relative: 2 %
Neutro Abs: 10.7 10*3/uL — ABNORMAL HIGH (ref 1.7–7.7)
Neutrophils Relative %: 88 %
Platelets: 293 10*3/uL (ref 150–400)
RBC: 4.41 MIL/uL (ref 3.87–5.11)
RDW: 12.8 % (ref 11.5–15.5)
WBC: 12.4 10*3/uL — ABNORMAL HIGH (ref 4.0–10.5)
nRBC: 0 % (ref 0.0–0.2)

## 2023-08-19 LAB — BASIC METABOLIC PANEL
Anion gap: 19 — ABNORMAL HIGH (ref 5–15)
BUN: 14 mg/dL (ref 8–23)
CO2: 22 mmol/L (ref 22–32)
Calcium: 9.6 mg/dL (ref 8.9–10.3)
Chloride: 89 mmol/L — ABNORMAL LOW (ref 98–111)
Creatinine, Ser: 0.58 mg/dL (ref 0.44–1.00)
GFR, Estimated: >60 mL/min (ref >60)
Glucose, Bld: 322 mg/dL — ABNORMAL HIGH (ref 70–99)
Potassium: 4.7 mmol/L (ref 3.5–5.1)
Sodium: 130 mmol/L — ABNORMAL LOW (ref 135–145)

## 2023-08-19 LAB — LIPASE, BLOOD: Lipase: 16 U/L (ref 11–51)

## 2023-08-19 LAB — URINALYSIS, W/ REFLEX TO CULTURE (INFECTION SUSPECTED)
Bacteria, UA: NONE SEEN
Bilirubin Urine: NEGATIVE
Glucose, UA: >=500 mg/dL — AB
Hgb urine dipstick: NEGATIVE
Ketones, ur: 80 mg/dL — AB
Leukocytes,Ua: NEGATIVE
Nitrite: NEGATIVE
Protein, ur: 100 mg/dL — AB
Specific Gravity, Urine: 1.016 (ref 1.005–1.030)
pH: 5 (ref 5.0–8.0)

## 2023-08-19 LAB — HEPATIC FUNCTION PANEL
ALT: 15 U/L (ref 0–44)
AST: 24 U/L (ref 15–41)
Albumin: 4 g/dL (ref 3.5–5.0)
Alkaline Phosphatase: 61 U/L (ref 38–126)
Bilirubin, Direct: 0.2 mg/dL (ref 0.0–0.2)
Indirect Bilirubin: 1 mg/dL — ABNORMAL HIGH (ref 0.3–0.9)
Total Bilirubin: 1.2 mg/dL (ref 0.3–1.2)
Total Protein: 7.4 g/dL (ref 6.5–8.1)

## 2023-08-19 LAB — CBG MONITORING, ED: Glucose-Capillary: 292 mg/dL — ABNORMAL HIGH (ref 70–99)

## 2023-08-19 LAB — MAGNESIUM: Magnesium: 1.6 mg/dL — ABNORMAL LOW (ref 1.7–2.4)

## 2023-08-19 MED ORDER — IOHEXOL 300 MG/ML  SOLN
100.0000 mL | Freq: Once | INTRAMUSCULAR | Status: AC | PRN
  Administered 2023-08-19: 80 mL via INTRAVENOUS

## 2023-08-19 MED ORDER — MAGNESIUM OXIDE -MG SUPPLEMENT 400 (240 MG) MG PO TABS
400.0000 mg | ORAL_TABLET | Freq: Once | ORAL | Status: AC
  Administered 2023-08-19: 400 mg via ORAL
  Filled 2023-08-19: qty 1

## 2023-08-19 MED ORDER — SODIUM CHLORIDE 0.9 % IV SOLN
1.0000 g | Freq: Once | INTRAVENOUS | Status: AC
  Administered 2023-08-19: 1 g via INTRAVENOUS
  Filled 2023-08-19: qty 10

## 2023-08-19 MED ORDER — METOCLOPRAMIDE HCL 10 MG PO TABS: 10.0000 mg | ORAL_TABLET | Freq: Four times a day (QID) | ORAL | 1 refills | Status: DC | PRN

## 2023-08-19 MED ORDER — SODIUM CHLORIDE 0.9 % IV BOLUS
1000.0000 mL | Freq: Once | INTRAVENOUS | Status: AC
  Administered 2023-08-19: 1000 mL via INTRAVENOUS

## 2023-08-19 MED ORDER — ONDANSETRON HCL 4 MG/2ML IJ SOLN
4.0000 mg | Freq: Once | INTRAMUSCULAR | Status: AC
  Administered 2023-08-19: 4 mg via INTRAVENOUS
  Filled 2023-08-19: qty 2

## 2023-08-19 NOTE — Discharge Instructions (Addendum)
I recommend sticking to a liquid diet or easy to chew / digest solid foods for the next 3-5 days or until abdominal discomfort full resolved. She can use reglan as needed for nausea and stomach pain. Please finish the macrobid for UTI that was already prescribed.

## 2023-08-19 NOTE — ED Triage Notes (Addendum)
Pt arrived via EMS, from [facility] [address] (assisted living). Dx with UTI Thursday and has refused all medications and poor PO intake since. N/v this morning,  abd pain since Thursday.  Aox4.    344 CBG 80 HR

## 2023-08-19 NOTE — ED Provider Notes (Signed)
[place] EMERGENCY DEPARTMENT AT [facility] Provider Note   CSN: [id number] Arrival date & time: 08/19/23  1346     History  Chief Complaint  Patient presents with   Urinary Tract Infection    Judith [name] is a 82 y.o. female with past medical history significant for gastroparesis, diabetes, cardiac pacemaker, previous gastrectomy, previous cholecystectomy presents with concern for general fatigue, feeling unwell, poor p.o. intake, abdominal pain, nausea.  Recently diagnosed with UTI, has been taking Macrobid but has missed some doses due to poor oral intake.   Urinary Tract Infection      Home Medications Prior to Admission medications   Medication Sig Start Date End Date Taking? Authorizing Provider  metoCLOPramide (REGLAN) 10 MG tablet Take 1 tablet (10 mg total) by mouth every 6 (six) hours as needed for nausea. 08/19/23  Yes Zhuri Krass H, PA-C  acetaminophen (TYLENOL) 500 MG tablet Take 1 tablet (500 mg total) by mouth every 6 (six) hours as needed. 05/27/23   Dameron, Nolberto Hanlon, DO  Biotin 1000 MCG tablet Take 1,000 mcg by mouth daily.     [provider]  carvedilol (COREG) 12.5 MG tablet Take 1 tablet (12.5 mg total) by mouth 2 (two) times daily with a meal. 05/17/23   McDiarmid, Leighton Roach, MD  cetirizine (ZYRTEC) 10 MG tablet Take 10 mg by mouth daily as needed (itching). Patient not taking: Reported on 08/01/2023    [provider]  colchicine 0.6 MG tablet Take 0.6 mg by mouth daily.    [provider]  DULoxetine (CYMBALTA) 60 MG capsule Take 60 mg by mouth daily.    [provider]  famotidine (PEPCID) 20 MG tablet Take 1 tablet (20 mg total) by mouth at bedtime. 06/11/19   Noni Saupe, MD  fluocinonide-emollient (LIDEX-E) 0.05 % cream Apply 1 Application topically See admin instructions. "Apply to stomach topically every 24 hours as needed for at bedtime" Patient not taking: Reported on 08/01/2023    [provider]  fluticasone (FLONASE) 50 MCG/ACT nasal spray Place 2 sprays into both nostrils daily. 11/01/22   Dameron, Nolberto Hanlon, DO  gabapentin (NEURONTIN) 100 MG capsule Take 100-400 mg by mouth See admin instructions. Take 100mg  in the AM, 100mg  in the afternoon, and 400mg  at bedtime.    [provider]  insulin glargine (LANTUS) 100 unit/mL SOPN Inject 18 Units into the skin daily. 05/18/23   Alfredo Martinez, MD  insulin lispro (HUMALOG KWIKPEN) 100 UNIT/ML KwikPen Inject 0-12 Units into the skin 3 (three) times daily before meals. 80-150 = 8 units, 151-200 = 9 units, 201-250 = 10 units, 251-300 = 11 units, 301-350 = 14 units, 351-400 = 16 units and recheck at next meal, and call MD if multiple readings > 350 in a day. 05/27/23   Dameron, Nolberto Hanlon, DO  Insulin Pen Needle (B-D ULTRAFINE III SHORT PEN) 31G X 8 MM MISC To use as directed with insulin, four times a day. Dx E11.9, Z79.4 11/08/17   Lezlie Lye, Meda Coffee, MD  LIDOCAINE EX Apply 1 patch topically daily. Apply to lower back topically in the morning for back pain and remove per schedule    [provider]  losartan (COZAAR) 50 MG tablet Take 1 tablet (50 mg total) by mouth daily. 08/30/22   Moses Manners, MD  Multiple Vitamins-Minerals (MULTIVITAMIN ADULT) CHEW Chew 1 tablet by mouth daily.    [provider]  ondansetron (ZOFRAN) 4 MG tablet Take 1 tablet (  4 mg total) by mouth every 6 (six) hours. Patient not taking: Reported on 08/01/2023 04/24/23   Maury Dus, MD  PRESCRIPTION MEDICATION Take 3 tablets by mouth See admin instructions. Glucose Chewable Tablet - Give 3 tablets by mouth every 15 minutes as needed for blood glucose less than 70. Repeat if blood glucose is still below 100    [provider]  rosuvastatin (CRESTOR) 10 MG tablet Take 1 tablet (10 mg total) by mouth daily. 11/15/20   Just, Azalee Course, FNP  solifenacin (VESICARE) 10 MG tablet Take 1 tablet (10 mg total) by mouth daily. 08/21/22    Dameron, Nolberto Hanlon, DO      Allergies    Asa [aspirin], Ibuprofen, Ciprofloxacin, and Sulfa antibiotics    Review of Systems   Review of Systems  All other systems reviewed and are negative.   Physical Exam Updated Vital Signs BP (!) 154/73   Pulse 100   Temp 98 F (36.7 C)   Resp 16   SpO2 97%  Physical Exam Vitals and nursing note reviewed.  Constitutional:      General: She is not in acute distress.    Appearance: Normal appearance.  HENT:     Head: Normocephalic and atraumatic.  Eyes:     General:        Right eye: No discharge.        Left eye: No discharge.  Cardiovascular:     Rate and Rhythm: Normal rate and regular rhythm.     Heart sounds: No murmur heard.    No friction rub. No gallop.  Pulmonary:     Effort: Pulmonary effort is normal.     Breath sounds: Normal breath sounds.  Abdominal:     General: Bowel sounds are normal.     Palpations: Abdomen is soft.     Comments: Patient with some focal tenderness suprapubically, no rebound, rigidity, guarding  Skin:    General: Skin is warm and dry.     Capillary Refill: Capillary refill takes less than 2 seconds.  Neurological:     Mental Status: She is alert and oriented to person, place, and time.  Psychiatric:        Mood and Affect: Mood normal.        Behavior: Behavior normal.     ED Results / Procedures / Treatments   Labs (all labs ordered are listed, but only abnormal results are displayed) Labs Reviewed  CBC WITH DIFFERENTIAL/PLATELET - Abnormal; Notable for the following components:      Result Value   WBC 12.4 (*)    Neutro Abs 10.7 (*)    All other components within normal limits  BASIC METABOLIC PANEL - Abnormal; Notable for the following components:   Sodium 130 (*)    Chloride 89 (*)    Glucose, Bld 322 (*)    Anion gap 19 (*)    All other components within normal limits  HEPATIC FUNCTION PANEL - Abnormal; Notable for the following components:   Indirect Bilirubin 1.0 (*)    All  other components within normal limits  MAGNESIUM - Abnormal; Notable for the following components:   Magnesium 1.6 (*)    All other components within normal limits  URINALYSIS, W/ REFLEX TO CULTURE (INFECTION SUSPECTED) - Abnormal; Notable for the following components:   Glucose, UA >=500 (*)    Ketones, ur 80 (*)    Protein, ur 100 (*)    All other components within normal limits  CBG MONITORING, ED -  Abnormal; Notable for the following components:   Glucose-Capillary 292 (*)    All other components within normal limits  LIPASE, BLOOD    EKG None  Radiology CT ABDOMEN PELVIS W CONTRAST  Result Date: 08/19/2023 CLINICAL DATA:  Abdominal pain, urinary tract infection, nausea and vomiting EXAM: CT ABDOMEN AND PELVIS WITH CONTRAST TECHNIQUE: Multidetector CT imaging of the abdomen and pelvis was performed using the standard protocol following bolus administration of intravenous contrast. RADIATION DOSE REDUCTION: This exam was performed according to the departmental dose-optimization program which includes automated exposure control, adjustment of the mA and/or kV according to patient size and/or use of iterative reconstruction technique. CONTRAST:  80mL OMNIPAQUE IOHEXOL 300 MG/ML  SOLN COMPARISON:  06/13/2023 FINDINGS: Lower chest: No acute pleural or parenchymal lung disease. Hepatobiliary: No focal liver abnormalities. Prior cholecystectomy. Chronic dilation of the common bile duct measuring 9 mm. No intrahepatic biliary duct dilation. No choledocholithiasis or pneumobilia. Pancreas: Pancreatic atrophy. No acute findings or inflammatory change. Spleen: Normal in size without focal abnormality. Adrenals/Urinary Tract: Chronic right renal parenchymal scarring. Otherwise the kidneys enhance normally. No urinary tract calculi or obstructive uropathy. The adrenals and bladder are unremarkable. Stomach/Bowel: No bowel obstruction or ileus. Stable postsurgical changes at the gastroesophageal junction  and from prior gastrojejunostomy. Normal appendix right lower quadrant. No bowel wall thickening or inflammatory change. Vascular/Lymphatic: Aortic atherosclerosis. No enlarged abdominal or pelvic lymph nodes. Reproductive: Uterus and bilateral adnexa are unremarkable. Other: No free fluid or free intraperitoneal gas. No abdominal wall hernia. Musculoskeletal: No acute or destructive bony abnormalities. Reconstructed images demonstrate no additional findings. Chronic L1 compression deformity unchanged. IMPRESSION: 1. No acute intra-abdominal or intrapelvic process. 2.  Aortic Atherosclerosis (ICD10-I70.0). Electronically Signed   By: Sharlet Salina M.D.   On: 08/19/2023 18:33    Procedures Procedures    Medications Ordered in ED Medications  sodium chloride 0.9 % bolus 1,000 mL (0 mLs Intravenous Stopped 08/19/23 1801)  cefTRIAXone (ROCEPHIN) 1 g in sodium chloride 0.9 % 100 mL IVPB (0 g Intravenous Stopped 08/19/23 1801)  ondansetron (ZOFRAN) injection 4 mg (4 mg Intravenous Given 08/19/23 1752)  iohexol (OMNIPAQUE) 300 MG/ML solution 100 mL (80 mLs Intravenous Contrast Given 08/19/23 1731)  magnesium oxide (MAG-OX) tablet 400 mg (400 mg Oral Given 08/19/23 1759)    ED Course/ Medical Decision Making/ A&P                                 Medical Decision Making Risk Prescription drug management.   This patient is a 82 y.o. female  who presents to the ED for concern of abdominal pain, nausea, vomiting.   Differential diagnoses prior to evaluation: The emergent differential diagnosis includes, but is not limited to,  The causes of generalized abdominal pain include but are not limited to AAA, mesenteric ischemia, appendicitis, diverticulitis, DKA, gastritis, gastroenteritis, AMI, nephrolithiasis, pancreatitis, peritonitis, adrenal insufficiency,lead poisoning, iron toxicity, intestinal ischemia, constipation, UTI,SBO/LBO, splenic rupture, biliary disease, IBD, IBS, PUD, or hepatitis. -- overall  with high clinical suspicion for either ongoing UTI or complication related to gastroparesis . This is not an exhaustive differential.   Past Medical History / Co-morbidities / Social History: gastroparesis, diabetes, cardiac pacemaker, previous gastrectomy, recent ERCP with successful removal of choledocholithiasis and biliary sludge  Additional history: Chart reviewed. Pertinent results include: Reviewed recent outpatient feeling medicine, cardiology visits, as well as recent admission for common bile duct dilation  Physical Exam: Physical exam  performed. The pertinent findings include: Patient with some diffuse abdominal tenderness, without focal rebound, rigidity, guarding. Bp 154/73, at time of discharge, moderate hypertension may be secondary to pain  Lab Tests/Imaging studies: I personally interpreted labs/imaging and the pertinent results include: Overall unremarkable hepatic function panel, she does have mild hypomagnesemia, magnesium 1.6, normal lipase, blood cells are mildly elevated 12.4.  Her BMP is notable for pseudohyponatremia, sodium 136 and glucose 322, she does have an anion gap, suspect some degree of starvation ketoacidosis given her lack of appetite over the last several days and multiple episodes of emesis.  She does have some ketones in the urine.  No bicarb deficit, low suspicion for developing DKA.  CT abdomen pelvis with contrast involuntary by itself shows no evidence of acute abnormality.  I agree with the radiologist interpretation.  Medications: I ordered medication including Rocephin, fluid bolus, magnesium oxide, Zofran for nausea, concern for possible inadequately treated UTI.  Discussed with patient that her UTI does seem to have been adequately treated, but encouraged her to continue her course of Macrobid.  She has a history of gastroparesis and I do suspect that her symptoms may be secondary to gastroparesis flare with no other acute abnormality in the emergency  department today, we will refill her Reglan medication and encourage follow-up with her GI doctor.  I have reviewed the patients home medicines and have made adjustments as needed.   Disposition: After consideration of the diagnostic results and the patients response to treatment, I feel that patient is stable for discharge as discussed above .   emergency department workup does not suggest an emergent condition requiring admission or immediate intervention beyond what has been performed at this time. The plan is: as above. The patient is safe for discharge and has been instructed to return immediately for worsening symptoms, change in symptoms or any other concerns.  Final Clinical Impression(s) / ED Diagnoses Final diagnoses:  Gastroparesis  Nausea and vomiting, unspecified vomiting type    Rx / DC Orders ED Discharge Orders          Ordered    metoCLOPramide (REGLAN) 10 MG tablet  Every 6 hours PRN        08/19/23 1912              [place] 08/19/23 2009    Lorre Nick, MD 08/21/23 [phone]

## 2023-08-27 ENCOUNTER — Encounter: Payer: Self-pay | Admitting: Student

## 2023-09-04 MED ORDER — POLYETHYLENE GLYCOL 3350 17 GM/SCOOP PO POWD: 17.0000 g | Freq: Every day | ORAL | 0 refills | Status: DC

## 2023-09-06 MED ORDER — METOCLOPRAMIDE HCL 10 MG PO TABS: 10.0000 mg | ORAL_TABLET | Freq: Four times a day (QID) | ORAL | 1 refills | Status: DC | PRN

## 2023-09-06 NOTE — Addendum Note (Signed)
Addended by: Darral Dash on: 09/06/2023 07:31 AM   Modules accepted: Orders

## 2023-09-09 ENCOUNTER — Other Ambulatory Visit: Payer: Self-pay | Admitting: *Deleted

## 2023-09-09 ENCOUNTER — Telehealth: Payer: Self-pay | Admitting: Nurse Practitioner

## 2023-09-09 MED ORDER — METOCLOPRAMIDE HCL 10 MG PO TABS: 10.0000 mg | ORAL_TABLET | Freq: Three times a day (TID) | ORAL | 0 refills | Status: AC

## 2023-09-09 NOTE — Telephone Encounter (Signed)
Called patient's daughter to inform her of the new orders written per Dr. Rhea Belton in reference to the Reglan 10 mg po 3 times/day before meals and at bedtime prn. Daughter is happy with the orders.

## 2023-09-09 NOTE — Telephone Encounter (Signed)
Inbound call from patient daughter stating patient went to the ED for Gastroparesis. States patient has not been doing any better. States patient is very nauseous and has been losing weight. Patient's daughter requesting a call back to discuss further. Please advise, thank you.

## 2023-09-09 NOTE — Telephone Encounter (Signed)
Metoclopramide 10 mg 3 times daily before meals and at bedtime as needed Okay to prescribe as such

## 2023-09-09 NOTE — Telephone Encounter (Signed)
Daughter called about patient regarding the medication Reglan 10 mg tablet PO every 6 hours PRN. Chart shows the medication was ordered  on 09/06/23, and the daughter states the medication was ordered during an ED visit. During the ED visit on 08/19/23, patient was seen by Luther Hearing, PA at [facility] health. Patient was seen due to poor intake and N/V. Daughter states the Reglan is the only thing that works for the patient at this time. Daughter also reports the Nursing Home does not dispense the medication due to the prescription of "Nausea," patient has a diagnosis of gastroparesis, as stated by the daughter. Patient was last seen by Ms. Guenther on 05/10/23 and by Dr. Rhea Belton on 05/10/23 for pain management. Daughter is interested in having the prescription verbiage changed in order for the patient to continue to obtain the medication on a routine basis if possible. Please advise.

## 2023-09-27 ENCOUNTER — Ambulatory Visit: Payer: Medicare Other

## 2023-09-27 VITALS — Ht 63.0 in | Wt 113.0 lb

## 2023-09-27 DIAGNOSIS — Z0000 Encounter for general adult medical examination without abnormal findings: Secondary | ICD-10-CM | POA: Diagnosis not present

## 2023-09-27 NOTE — Patient Instructions (Signed)
Ms. Basher , Thank you for taking time to come for your Medicare Wellness Visit. I appreciate your ongoing commitment to your health goals. Please review the following plan we discussed and let me know if I can assist you in the future.   Referrals/Orders/Follow-Ups/Clinician Recommendations: Aim for 30 minutes of exercise or brisk walking, 6-8 glasses of water, and 5 servings of fruits and vegetables each day.  This is a list of the screening recommended for you and due dates:  Health Maintenance  Topic Date Due   Yearly kidney health urinalysis for diabetes  Never done   DTaP/Tdap/Td vaccine (1 - Tdap) Never done   Eye exam for diabetics  03/09/2021   Complete foot exam   08/23/2021   Flu Shot  07/25/2023   COVID-19 Vaccine (5 - 2023-24 season) 08/25/2023   Hemoglobin A1C  12/14/2023   Yearly kidney function blood test for diabetes  08/18/2024   Medicare Annual Wellness Visit  09/26/2024   Pneumonia Vaccine  Completed   DEXA scan (bone density measurement)  Completed   Zoster (Shingles) Vaccine  Completed   HPV Vaccine  Aged Out    Advanced directives: (In Chart) A copy of your advanced directives are scanned into your chart should your provider ever need it.  Next Medicare Annual Wellness Visit scheduled for next year: Yes

## 2023-09-27 NOTE — Progress Notes (Signed)
Subjective:   Judith [name] is a 82 y.o. female who presents for Medicare Annual (Subsequent) preventive examination.  Visit Complete: Virtual  I connected with  Judith [name] on 09/27/23 by a audio enabled telemedicine application and verified that I am speaking with the correct person using two identifiers.  Patient Location: Other:  Assisted Living Facility  Provider Location: Home Office  I discussed the limitations of evaluation and management by telemedicine. The patient expressed understanding and agreed to proceed.  Because this visit was a virtual/telehealth visit, some criteria may be missing or patient reported. Any vitals not documented were not able to be obtained and vitals that have been documented are patient reported.   Cardiac Risk Factors include: advanced age (>22men, >38 women);diabetes mellitus;dyslipidemia;hypertension     Objective:    Today's Vitals   09/27/23 1658  Weight: 113 lb (51.3 kg)  Height: 5\' 3"  (1.6 m)   Body mass index is 20.02 kg/m.     09/27/2023    5:05 PM 06/13/2023    1:08 PM 05/02/2023    3:05 PM 04/25/2023   10:48 AM 04/24/2023    9:26 AM 04/23/2023    5:12 PM 02/15/2023    9:10 AM  Advanced Directives  Does Patient Have a Medical Advance Directive? Yes Yes Yes Yes No No Yes  Type of Estate agent of [place];Living will Healthcare Power of [employer] of [place];Living will Healthcare Power of [place];Living will   Healthcare Power of [place];Living will  Does patient want to make changes to medical advance directive? No - Patient declined   No - Patient declined     Copy of Healthcare Power of Attorney in Chart? Yes - validated most recent copy scanned in chart (See row information) No - copy requested Yes - validated most recent copy scanned in chart (See row information) Yes - validated most recent copy scanned in chart (See row information)   No - copy requested  Would patient like information  on creating a medical advance directive?     No - Patient declined No - Patient declined No - Patient declined    Current Medications (verified) Outpatient Encounter Medications as of 09/27/2023  Medication Sig   acetaminophen (TYLENOL) 500 MG tablet Take 1 tablet (500 mg total) by mouth every 6 (six) hours as needed.   Biotin 1000 MCG tablet Take 1,000 mcg by mouth daily.    carvedilol (COREG) 12.5 MG tablet Take 1 tablet (12.5 mg total) by mouth 2 (two) times daily with a meal.   colchicine 0.6 MG tablet Take 0.6 mg by mouth daily.   DULoxetine (CYMBALTA) 60 MG capsule Take 60 mg by mouth daily.   famotidine (PEPCID) 20 MG tablet Take 1 tablet (20 mg total) by mouth at bedtime.   fluticasone (FLONASE) 50 MCG/ACT nasal spray Place 2 sprays into both nostrils daily.   gabapentin (NEURONTIN) 100 MG capsule Take 100-400 mg by mouth See admin instructions. Take 100mg  in the AM, 100mg  in the afternoon, and 400mg  at bedtime.   insulin glargine (LANTUS) 100 unit/mL SOPN Inject 18 Units into the skin daily.   insulin lispro (HUMALOG KWIKPEN) 100 UNIT/ML KwikPen Inject 0-12 Units into the skin 3 (three) times daily before meals. 80-150 = 8 units, 151-200 = 9 units, 201-250 = 10 units, 251-300 = 11 units, 301-350 = 14 units, 351-400 = 16 units and recheck at next meal, and call MD if multiple readings > 350 in a day.  Insulin Pen Needle (B-D ULTRAFINE III SHORT PEN) 31G X 8 MM MISC To use as directed with insulin, four times a day. Dx E11.9, Z79.4   LIDOCAINE EX Apply 1 patch topically daily. Apply to lower back topically in the morning for back pain and remove per schedule   losartan (COZAAR) 50 MG tablet Take 1 tablet (50 mg total) by mouth daily.   metoCLOPramide (REGLAN) 10 MG tablet Take 1 tablet (10 mg total) by mouth 4 (four) times daily -  before meals and at bedtime. Take 1 tablet 10 mg by mouth 3 times/day before meals and at bedtime prn   Multiple Vitamins-Minerals (MULTIVITAMIN ADULT) CHEW  Chew 1 tablet by mouth daily.   ondansetron (ZOFRAN) 4 MG tablet Take 1 tablet (4 mg total) by mouth every 6 (six) hours.   polyethylene glycol powder (GLYCOLAX/MIRALAX) 17 GM/SCOOP powder Take 17 g by mouth daily.   PRESCRIPTION MEDICATION Take 3 tablets by mouth See admin instructions. Glucose Chewable Tablet - Give 3 tablets by mouth every 15 minutes as needed for blood glucose less than 70. Repeat if blood glucose is still below 100   rosuvastatin (CRESTOR) 10 MG tablet Take 1 tablet (10 mg total) by mouth daily.   solifenacin (VESICARE) 10 MG tablet Take 1 tablet (10 mg total) by mouth daily.   cetirizine (ZYRTEC) 10 MG tablet Take 10 mg by mouth daily as needed (itching). (Patient not taking: Reported on 08/01/2023)   fluocinonide-emollient (LIDEX-E) 0.05 % cream Apply 1 Application topically See admin instructions. "Apply to stomach topically every 24 hours as needed for at bedtime" (Patient not taking: Reported on 08/01/2023)   No facility-administered encounter medications on file as of 09/27/2023.    Allergies (verified) Asa [aspirin], Ibuprofen, Ciprofloxacin, and Sulfa antibiotics   History: Past Medical History:  Diagnosis Date   Allergy 1990   Anemia    Arthritis    Biliary gastritis    Blood transfusion without reported diagnosis    2007   Cervical spine arthritis 04/05/2016   Choledocholithiasis    Controlled type 2 diabetes mellitus with diabetic neuropathy, with long-term current use of insulin (HCC)    Depression    Diabetes mellitus type 2 with complications (HCC)    Dilated bile duct    Duodenal ulcer disease    Encounter for long-term (current) use of insulin (HCC)    Gallstones 01/17/2018   Gastric ulcer    Gastritis and gastroduodenitis    Gastroparesis    severe   GERD (gastroesophageal reflux disease)    Gout    tested for gout but was not   Heart murmur    Hiatal hernia    Hyperlipidemia    in past not now   Hypertension    Hypokalemia,  gastrointestinal losses    secondary to severe gastroparesis   Internal hemorrhoids    Long-term insulin use in type 2 diabetes (HCC) 04/05/2016   Osteoporosis    just had bone density test possible    Peripheral neuropathy    Presence of cardiac pacemaker    S/P partial gastrectomy    due to severe gastric and gudoenal ulcers   Seasonal allergies    Sleep apnea    could not use CPAP   UTI (urinary tract infection)    Past Surgical History:  Procedure Laterality Date   BILIARY DILATION  02/01/2022   Procedure: BILIARY DILATION;  Surgeon: Lemar Lofty., MD;  Location: [facility] ENDOSCOPY;  Service: Gastroenterology;;   BILIARY DILATION  04/09/2022   Procedure: BILIARY DILATION;  Surgeon: Meridee Score Netty Starring., MD;  Location: [facility] ENDOSCOPY;  Service: Gastroenterology;;   BILIARY DILATION  06/13/2023   Procedure: BILIARY DILATION;  Surgeon: Lemar Lofty., MD;  Location: [facility] ENDOSCOPY;  Service: Gastroenterology;;   BILIARY STENT PLACEMENT N/A 11/30/2021   Procedure: BILIARY STENT PLACEMENT;  Surgeon: Lemar Lofty., MD;  Location: [facility] ENDOSCOPY;  Service: Gastroenterology;  Laterality: N/A;   BILIARY STENT PLACEMENT  02/01/2022   Procedure: BILIARY STENT PLACEMENT;  Surgeon: Meridee Score Netty Starring., MD;  Location: [facility] ENDOSCOPY;  Service: Gastroenterology;;   BIOPSY  11/30/2021   Procedure: BIOPSY;  Surgeon: Lemar Lofty., MD;  Location: [facility] ENDOSCOPY;  Service: Gastroenterology;;   BIOPSY  04/09/2022   Procedure: BIOPSY;  Surgeon: Lemar Lofty., MD;  Location: [facility] ENDOSCOPY;  Service: Gastroenterology;;   CHOLECYSTECTOMY N/A 12/06/2021   Procedure: LAPAROSCOPIC CHOLECYSTECTOMY, LYSIS OF ADHESIONS;  Surgeon: Harriette Bouillon, MD;  Location: [facility] ORS;  Service: General;  Laterality: N/A;   COLONOSCOPY  02/11/2018   no polyps, + small internal hemorrhoids   ENDOSCOPIC RETROGRADE CHOLANGIOPANCREATOGRAPHY (ERCP) WITH PROPOFOL N/A 11/30/2021   Procedure:  ENDOSCOPIC RETROGRADE CHOLANGIOPANCREATOGRAPHY (ERCP) WITH PROPOFOL;  Surgeon: Lemar Lofty., MD;  Location: [facility] ENDOSCOPY;  Service: Gastroenterology;  Laterality: N/A;   ENDOSCOPIC RETROGRADE CHOLANGIOPANCREATOGRAPHY (ERCP) WITH PROPOFOL N/A 02/01/2022   Procedure: ENDOSCOPIC RETROGRADE CHOLANGIOPANCREATOGRAPHY (ERCP) WITH PROPOFOL;  Surgeon: Meridee Score Netty Starring., MD;  Location: [facility] ENDOSCOPY;  Service: Gastroenterology;  Laterality: N/A;   ENDOSCOPIC RETROGRADE CHOLANGIOPANCREATOGRAPHY (ERCP) WITH PROPOFOL N/A 04/09/2022   Procedure: ENDOSCOPIC RETROGRADE CHOLANGIOPANCREATOGRAPHY (ERCP) WITH PROPOFOL;  Surgeon: Meridee Score Netty Starring., MD;  Location: [facility] ENDOSCOPY;  Service: Gastroenterology;  Laterality: N/A;   ENDOSCOPIC RETROGRADE CHOLANGIOPANCREATOGRAPHY (ERCP) WITH PROPOFOL N/A 06/13/2023   Procedure: ENDOSCOPIC RETROGRADE CHOLANGIOPANCREATOGRAPHY (ERCP) WITH PROPOFOL;  Surgeon: Meridee Score Netty Starring., MD;  Location: [facility] ENDOSCOPY;  Service: Gastroenterology;  Laterality: N/A;   ESOPHAGOGASTRODUODENOSCOPY (EGD) WITH PROPOFOL N/A 11/30/2021   Procedure: ESOPHAGOGASTRODUODENOSCOPY (EGD) WITH PROPOFOL;  Surgeon: Meridee Score Netty Starring., MD;  Location: [facility] ENDOSCOPY;  Service: Gastroenterology;  Laterality: N/A;   EUS N/A 12/12/2017   Procedure: UPPER ENDOSCOPIC ULTRASOUND (EUS) RADIAL;  Surgeon: Rachael Fee, MD;  Location: [facility] ENDOSCOPY;  Service: Endoscopy;  Laterality: N/A;   EYE SURGERY  2024   GASTRECTOMY     JOINT REPLACEMENT     pace maker     PARTIAL GASTRECTOMY     PPM GENERATOR CHANGEOUT N/A 04/25/2023   Procedure: PPM GENERATOR CHANGEOUT;  Surgeon: Thurmon Fair, MD;  Location: [facility] INVASIVE CV LAB;  Service: Cardiovascular;  Laterality: N/A;   REMOVAL OF STONES  02/01/2022   Procedure: REMOVAL OF STONES;  Surgeon: Meridee Score Netty Starring., MD;  Location: [facility] ENDOSCOPY;  Service: Gastroenterology;;   REMOVAL OF STONES  04/09/2022   Procedure: REMOVAL OF STONES;   Surgeon: Lemar Lofty., MD;  Location: [facility] ENDOSCOPY;  Service: Gastroenterology;;   REMOVAL OF STONES  06/13/2023   Procedure: REMOVAL OF STONES;  Surgeon: Lemar Lofty., MD;  Location: [facility] ENDOSCOPY;  Service: Gastroenterology;;   Dennison Mascot  11/30/2021   Procedure: Dennison Mascot;  Surgeon: Lemar Lofty., MD;  Location: [facility] ENDOSCOPY;  Service: Gastroenterology;;   Francine Graven REMOVAL  02/01/2022   Procedure: STENT REMOVAL;  Surgeon: Lemar Lofty., MD;  Location[facility] ENDOSCOPY;  Service: Gastroenterology;;   Francine Graven REMOVAL  04/09/2022   Procedure: STENT REMOVAL;  Surgeon: Lemar Lofty., MD;  Location: [facility] ENDOSCOPY;  Service: Gastroenterology;;   SUBMUCOSAL TATTOO INJECTION  11/30/2021  Procedure: SUBMUCOSAL TATTOO INJECTION;  Surgeon: Lemar Lofty., MD;  Location: [facility] ENDOSCOPY;  Service: Gastroenterology;;   TONSILLECTOMY     45 years ago   UPPER GASTROINTESTINAL ENDOSCOPY     Family History  Problem Relation Age of Onset   Diabetes Mother    Diabetes Father    Heart disease Father    Diabetes Sister    Heart disease Son    Colon cancer Neg Hx    Colon polyps Neg Hx    Rectal cancer Neg Hx    Stomach cancer Neg Hx    Esophageal cancer Neg Hx    Social History   Socioeconomic History   Marital status: Widowed    Spouse name: Not on file   Number of children: 3   Years of education: Not on file   Highest education level: High school graduate  Occupational History   Not on file  Tobacco Use   Smoking status: Never   Smokeless tobacco: Never  Vaping Use   Vaping status: Never Used  Substance and Sexual Activity   Alcohol use: No   Drug use: No   Sexual activity: Not Currently  Other Topics Concern   Not on file  Social History Narrative   Used to live independently in [place]   However had significant issues with severe gastroparesis resulting in severe hypokalemia and general weakness and fall resulting in  hospitalization and SNF rehab   Now (10/2017) living with her daughter in [place] until able to stabilize and regain independence.      May 2019   Patient living in assisted living, [place]   Social Determinants of Health   Financial Resource Strain: Low Risk  (09/27/2023)   Overall Financial Resource Strain (CARDIA)    Difficulty of Paying Living Expenses: Not hard at all  Food Insecurity: No Food Insecurity (09/27/2023)   Hunger Vital Sign    Worried About Running Out of Food in the Last Year: Never true    Ran Out of Food in the Last Year: Never true  Transportation Needs: No Transportation Needs (09/27/2023)   PRAPARE - Administrator, Civil Service (Medical): No    Lack of Transportation (Non-Medical): No  Physical Activity: Inactive (09/27/2023)   Exercise Vital Sign    Days of Exercise per Week: 0 days    Minutes of Exercise per Session: 0 min  Stress: No Stress Concern Present (09/27/2023)   [employer] of Occupational Health - Occupational Stress Questionnaire    Feeling of Stress : Not at all  Social Connections: Moderately Isolated (09/27/2023)   Social Connection and Isolation Panel [NHANES]    Frequency of Communication with Friends and Family: Twice a week    Frequency of Social Gatherings with Friends and Family: Once a week    Attends Religious Services: 1 to 4 times per year    Active Member of [employer] or Organizations: No    Attends Banker Meetings: Never    Marital Status: Widowed    Tobacco Counseling Counseling given: Not Answered   Clinical Intake:  Pre-visit preparation completed: Yes  Pain : No/denies pain     Diabetes: Yes CBG done?: No Did pt. bring in CBG monitor from home?: No  How often do you need to have someone help you when you read instructions, pamphlets, or other written materials from your doctor or pharmacy?: 1 - Never  Interpreter Needed?: No  Information entered by :: Kandis Fantasia LPN   Activities  of Daily Living    09/27/2023    5:04 PM 06/07/2023    9:25 AM  In your present state of health, do you have any difficulty performing the following activities:  Hearing? 0 0  Vision? 0 0  Difficulty concentrating or making decisions? 0 0  Walking or climbing stairs? 1 0  Dressing or bathing? 0 0  Doing errands, shopping? 1 0  Preparing Food and eating ? N N  Using the Toilet? N N  In the past six months, have you accidently leaked urine? N Y  Do you have problems with loss of bowel control? N N  Managing your Medications? N Y  Managing your Finances? Malvin Johns  Housekeeping or managing your Housekeeping? Y N    Patient Care Team: Darral Dash, DO as PCP - General (Family Medicine) Thurmon Fair, MD as PCP - Cardiology (Cardiology) Delrae Alfred, MD as Referring Physician (Surgical Oncology) Pyrtle, Carie Caddy, MD as Consulting Physician (Gastroenterology) Marlene Lard, MD as Referring Physician (Endocrinology) Thurmon Fair, MD as Consulting Physician (Cardiology) Mansouraty, Netty Starring., MD as Consulting Physician (Gastroenterology)  Indicate any recent Medical Services you may have received from other than [facility] providers in the past year (date may be approximate).     Assessment:   This is a routine wellness examination for Noni.  Hearing/Vision screen Hearing Screening - Comments:: Some hearing loss  Vision Screening - Comments:: Wears rx glasses - up to date with routine eye exams with Dr. Ashley Royalty     Goals Addressed   None   Depression Screen    09/27/2023    5:02 PM 05/27/2023    2:08 PM 05/02/2023    3:13 PM 04/24/2023    9:33 AM 02/15/2023    9:10 AM 01/11/2023    9:07 AM 12/28/2022    9:02 AM  PHQ 2/9 Scores  PHQ - 2 Score 0 0 0 0 0 0 0  PHQ- 9 Score  0 0 0 0 0 0    Fall Risk    09/27/2023    5:04 PM 06/07/2023    9:25 AM 05/27/2023    2:08 PM 05/02/2023    3:19 PM 02/15/2023    9:18 AM  Fall Risk   Falls in the past year? 0 1 0 0 1  Number falls in  past yr: 0 0   0  Injury with Fall? 0 1     Risk for fall due to : Impaired mobility;Impaired balance/gait      Follow up Falls prevention discussed;Education provided;Falls evaluation completed        MEDICARE RISK AT HOME: Medicare Risk at Home Any stairs in or around the home?: No If so, are there any without handrails?: No Home free of loose throw rugs in walkways, pet beds, electrical cords, etc?: Yes Adequate lighting in your home to reduce risk of falls?: Yes Life alert?: No Use of a cane, walker or w/c?: Yes Grab bars in the bathroom?: Yes Shower chair or bench in shower?: Yes Elevated toilet seat or a handicapped toilet?: Yes  TIMED UP AND GO:  Was the test performed?  No    Cognitive Function:        09/27/2023    5:05 PM 09/21/2019   10:08 AM 01/24/2018    1:48 PM  6CIT Screen  What Year? 0 points 0 points 0 points  What month? 0 points 0 points 0 points  What time? 0 points 0 points 3 points  Count back  from 20 0 points 0 points 0 points  Months in reverse 2 points 0 points 0 points  Repeat phrase 0 points 0 points 4 points  Total Score 2 points 0 points 7 points    Immunizations Immunization History  Administered Date(s) Administered   Influenza, High Dose Seasonal PF 09/22/2018   Influenza-Unspecified 11/08/2020, 09/26/2022   Moderna Sars-Covid-2 Vaccination 02/03/2020, 03/02/2020, 10/25/2020   PNEUMOCOCCAL CONJUGATE-20 02/15/2023   PPD Test 04/03/2018   Pfizer(Comirnaty)Fall Seasonal Vaccine 12 years and older 02/15/2023   Pneumococcal Polysaccharide-23 01/22/2019   Zoster Recombinant(Shingrix) 01/12/2022, 03/11/2022    TDAP status: Due, Education has been provided regarding the importance of this vaccine. Advised may receive this vaccine at local pharmacy or Health Dept. Aware to provide a copy of the vaccination record if obtained from local pharmacy or Health Dept. Verbalized acceptance and understanding.  Flu Vaccine status: Up to  date  Pneumococcal vaccine status: Up to date  Covid-19 vaccine status: Information provided on how to obtain vaccines.   Qualifies for Shingles Vaccine? Yes   Zostavax completed No   Shingrix Completed?: Yes  Screening Tests Health Maintenance  Topic Date Due   Diabetic kidney evaluation - Urine ACR  Never done   DTaP/Tdap/Td (1 - Tdap) Never done   OPHTHALMOLOGY EXAM  03/09/2021   FOOT EXAM  08/23/2021   INFLUENZA VACCINE  07/25/2023   COVID-19 Vaccine (5 - 2023-24 season) 08/25/2023   HEMOGLOBIN A1C  12/14/2023   Diabetic kidney evaluation - eGFR measurement  08/18/2024   Medicare Annual Wellness (AWV)  09/26/2024   Pneumonia Vaccine 89+ Years old  Completed   DEXA SCAN  Completed   Zoster Vaccines- Shingrix  Completed   HPV VACCINES  Aged Out    Health Maintenance  Health Maintenance Due  Topic Date Due   Diabetic kidney evaluation - Urine ACR  Never done   DTaP/Tdap/Td (1 - Tdap) Never done   OPHTHALMOLOGY EXAM  03/09/2021   FOOT EXAM  08/23/2021   INFLUENZA VACCINE  07/25/2023   COVID-19 Vaccine (5 - 2023-24 season) 08/25/2023    Colorectal cancer screening: No longer required.   Mammogram status: No longer required due to age and preference .  Bone Density status: Ordered and scheduled for 02/06/24. Pt provided with contact info and advised to call to schedule appt.  Lung Cancer Screening: (Low Dose CT Chest recommended if Age 33-80 years, 20 pack-year currently smoking OR have quit w/in 15years.) does not qualify.   Lung Cancer Screening Referral: n/a  Additional Screening:  Hepatitis C Screening: does not qualify  Vision Screening: Recommended annual ophthalmology exams for early detection of glaucoma and other disorders of the eye. Is the patient up to date with their annual eye exam?  Yes  Who is the provider or what is the name of the office in which the patient attends annual eye exams? Dr. Ashley Royalty  If pt is not established with a provider, would  they like to be referred to a provider to establish care? No .   Dental Screening: Recommended annual dental exams for proper oral hygiene  Diabetic Foot Exam: Diabetic Foot Exam: Overdue, Pt has been advised about the importance in completing this exam. Pt is scheduled for diabetic foot exam on at next office visit.  Community Resource Referral / Chronic Care Management: CRR required this visit?  No   CCM required this visit?  No     Plan:     I have personally reviewed and noted the  following in the patient's chart:   Medical and social history Use of alcohol, tobacco or illicit drugs  Current medications and supplements including opioid prescriptions. Patient is not currently taking opioid prescriptions. Functional ability and status Nutritional status Physical activity Advanced directives List of other physicians Hospitalizations, surgeries, and ER visits in previous 12 months Vitals Screenings to include cognitive, depression, and falls Referrals and appointments  In addition, I have reviewed and discussed with patient certain preventive protocols, quality metrics, and best practice recommendations. A written personalized care plan for preventive services as well as general preventive health recommendations were provided to patient.     Kandis Fantasia [place], [place]   16/12/958   After Visit Summary: (MyChart) Due to this being a telephonic visit, the after visit summary with patients personalized plan was offered to patient via MyChart   Nurse Notes: No concerns at this time

## 2023-10-03 ENCOUNTER — Telehealth: Payer: Self-pay

## 2023-10-03 NOTE — Telephone Encounter (Signed)
Judith [name] with Judith [name] calls nurse line to confirm an allergy.   She reports they have in their system that she has an allergy to Tylenol.   I advised in her EPIC record she has an intolerance to Ibuprofen.   She reports PCP had ordered PRN Tylenol and just wanted to clarify.

## 2023-10-15 ENCOUNTER — Encounter (INDEPENDENT_AMBULATORY_CARE_PROVIDER_SITE_OTHER): Payer: Medicare Other | Admitting: Ophthalmology

## 2023-10-15 DIAGNOSIS — H35033 Hypertensive retinopathy, bilateral: Secondary | ICD-10-CM | POA: Diagnosis not present

## 2023-10-15 DIAGNOSIS — Z794 Long term (current) use of insulin: Secondary | ICD-10-CM

## 2023-10-15 DIAGNOSIS — I10 Essential (primary) hypertension: Secondary | ICD-10-CM | POA: Diagnosis not present

## 2023-10-15 DIAGNOSIS — E113393 Type 2 diabetes mellitus with moderate nonproliferative diabetic retinopathy without macular edema, bilateral: Secondary | ICD-10-CM

## 2023-10-15 DIAGNOSIS — H43813 Vitreous degeneration, bilateral: Secondary | ICD-10-CM

## 2023-10-18 ENCOUNTER — Telehealth: Payer: Self-pay

## 2023-10-18 NOTE — Telephone Encounter (Signed)
Daughter calls nurse line requesting an apt after multiple ED visits.   She reports her mother has been complaining of bilateral leg pain for the past several days. She reports she has been to the ED twice (see care everywhere.)  NP called nurse line from [facility] visit. Repots she feels the leg pain may be related to Losartan or possible low vitamin D. She requests a referral to Rheumatology as well.   Patient scheduled for 10/30 to discuss above.   Patient will be coming alone as daughter has to work.   Daughter reports she will send a mychart message ahead of time in regards to concerns.   Patient is seeing [facility].

## 2023-10-20 ENCOUNTER — Encounter: Payer: Self-pay | Admitting: Family Medicine

## 2023-10-22 ENCOUNTER — Emergency Department (HOSPITAL_COMMUNITY)
Admission: EM | Admit: 2023-10-22 | Discharge: 2023-10-23 | Disposition: A | Discharge status: Home / Self Care | Payer: Medicare Other | Attending: Emergency Medicine | Admitting: Emergency Medicine

## 2023-10-22 ENCOUNTER — Other Ambulatory Visit: Payer: Self-pay

## 2023-10-22 DIAGNOSIS — M79604 Pain in right leg: Secondary | ICD-10-CM | POA: Diagnosis present

## 2023-10-22 DIAGNOSIS — Z79899 Other long term (current) drug therapy: Secondary | ICD-10-CM | POA: Insufficient documentation

## 2023-10-22 DIAGNOSIS — I10 Essential (primary) hypertension: Secondary | ICD-10-CM | POA: Insufficient documentation

## 2023-10-22 DIAGNOSIS — E119 Type 2 diabetes mellitus without complications: Secondary | ICD-10-CM | POA: Insufficient documentation

## 2023-10-22 DIAGNOSIS — Z794 Long term (current) use of insulin: Secondary | ICD-10-CM | POA: Insufficient documentation

## 2023-10-22 DIAGNOSIS — M79605 Pain in left leg: Secondary | ICD-10-CM | POA: Insufficient documentation

## 2023-10-22 DIAGNOSIS — M79651 Pain in right thigh: Secondary | ICD-10-CM

## 2023-10-22 MED ORDER — KETOROLAC TROMETHAMINE 60 MG/2ML IM SOLN
30.0000 mg | Freq: Once | INTRAMUSCULAR | Status: AC
  Administered 2023-10-23: 30 mg via INTRAMUSCULAR
  Filled 2023-10-22: qty 2

## 2023-10-22 NOTE — ED Provider Notes (Incomplete)
[place] EMERGENCY DEPARTMENT AT [facility] Provider Note  CSN: [id number] Arrival date & time: 10/22/23 1555  Chief Complaint(s) Leg Pain  HPI Judith [name] is a 82 y.o. female {Add pertinent medical, surgical, social history, OB history to HPI:1}    Leg Pain   Past Medical History Past Medical History:  Diagnosis Date  . Allergy 1990  . Anemia   . Arthritis   . Biliary gastritis   . Blood transfusion without reported diagnosis    2007  . Cervical spine arthritis 04/05/2016  . Choledocholithiasis   . Controlled type 2 diabetes mellitus with diabetic neuropathy, with long-term current use of insulin (HCC)   . Depression   . Diabetes mellitus type 2 with complications (HCC)   . Dilated bile duct   . Duodenal ulcer disease   . Encounter for long-term (current) use of insulin (HCC)   . Gallstones 01/17/2018  . Gastric ulcer   . Gastritis and gastroduodenitis   . Gastroparesis    severe  . GERD (gastroesophageal reflux disease)   . Gout    tested for gout but was not  . Heart murmur   . Hiatal hernia   . Hyperlipidemia    in past not now  . Hypertension   . Hypokalemia, gastrointestinal losses    secondary to severe gastroparesis  . Internal hemorrhoids   . Long-term insulin use in type 2 diabetes (HCC) 04/05/2016  . Osteoporosis    just had bone density test possible   . Peripheral neuropathy   . Presence of cardiac pacemaker   . S/P partial gastrectomy    due to severe gastric and gudoenal ulcers  . Seasonal allergies   . Sleep apnea    could not use CPAP  . UTI (urinary tract infection)    Patient Active Problem List   Diagnosis Date Noted  . Pulmonary nodule 06/17/2023  . Abdominal pain 06/13/2023  . Pacemaker battery depletion 04/25/2023  . Fall 01/11/2023  . UTI (urinary tract infection) 12/28/2022  . Postnasal drip 11/01/2022  . Urinary incontinence 08/22/2022  . Encounter to establish care with new doctor 08/07/2022  .  Choledocholithiasis 11/28/2021  . Sleep apnea   . Hyperlipidemia   . Type 2 diabetes mellitus with hyperglycemia (HCC)   . Polycythemia   . Unspecified inflammatory spondylopathy, cervical region (HCC) 11/15/2020  . Multilevel degenerative disc disease 04/03/2018  . Acute blood loss anemia 01/17/2018  . Gallstones 01/17/2018  . Unintentional weight loss 01/17/2018  . Dilated bile duct   . Elevated liver function tests   . Gastritis and gastroduodenitis   . S/P partial gastrectomy   . Peripheral neuropathy   . Benign essential HTN   . Gastroparesis   . Duodenal ulcer disease   . Controlled type 2 diabetes mellitus with diabetic neuropathy, with long-term current use of insulin (HCC)   . Depression   . Biliary gastritis   . Seasonal allergies   . Presence of cardiac pacemaker   . Gout   . Cervical spine arthritis 04/05/2016  . Long-term insulin use in type 2 diabetes (HCC) 04/05/2016   Home Medication(s) Prior to Admission medications   Medication Sig Start Date End Date Taking? Authorizing Provider  acetaminophen (TYLENOL) 500 MG tablet Take 1 tablet (500 mg total) by mouth every 6 (six) hours as needed. 05/27/23   Dameron, Nolberto Hanlon, DO  Biotin 1000 MCG tablet Take 1,000 mcg by mouth daily.     [provider]  carvedilol (COREG)  12.5 MG tablet Take 1 tablet (12.5 mg total) by mouth 2 (two) times daily with a meal. 05/17/23   McDiarmid, Leighton Roach, MD  cetirizine (ZYRTEC) 10 MG tablet Take 10 mg by mouth daily as needed (itching). Patient not taking: Reported on 08/01/2023    [provider]  colchicine 0.6 MG tablet Take 0.6 mg by mouth daily.    [provider]  DULoxetine (CYMBALTA) 60 MG capsule Take 60 mg by mouth daily.    [provider]  famotidine (PEPCID) 20 MG tablet Take 1 tablet (20 mg total) by mouth at bedtime. 06/11/19   Noni Saupe, MD  fluocinonide-emollient (LIDEX-E) 0.05 % cream Apply 1 Application topically See admin  instructions. "Apply to stomach topically every 24 hours as needed for at bedtime" Patient not taking: Reported on 08/01/2023    [provider]  fluticasone (FLONASE) 50 MCG/ACT nasal spray Place 2 sprays into both nostrils daily. 11/01/22   Dameron, Nolberto Hanlon, DO  gabapentin (NEURONTIN) 100 MG capsule Take 100-400 mg by mouth See admin instructions. Take 100mg  in the AM, 100mg  in the afternoon, and 400mg  at bedtime.    [provider]  insulin glargine (LANTUS) 100 unit/mL SOPN Inject 18 Units into the skin daily. 05/18/23   Alfredo Martinez, MD  insulin lispro (HUMALOG KWIKPEN) 100 UNIT/ML KwikPen Inject 0-12 Units into the skin 3 (three) times daily before meals. 80-150 = 8 units, 151-200 = 9 units, 201-250 = 10 units, 251-300 = 11 units, 301-350 = 14 units, 351-400 = 16 units and recheck at next meal, and call MD if multiple readings > 350 in a day. 05/27/23   Dameron, Nolberto Hanlon, DO  Insulin Pen Needle (B-D ULTRAFINE III SHORT PEN) 31G X 8 MM MISC To use as directed with insulin, four times a day. Dx E11.9, Z79.4 11/08/17   Lezlie Lye, Meda Coffee, MD  LIDOCAINE EX Apply 1 patch topically daily. Apply to lower back topically in the morning for back pain and remove per schedule    [provider]  losartan (COZAAR) 50 MG tablet Take 1 tablet (50 mg total) by mouth daily. 08/30/22   Moses Manners, MD  metoCLOPramide (REGLAN) 10 MG tablet Take 1 tablet (10 mg total) by mouth 4 (four) times daily -  before meals and at bedtime. Take 1 tablet 10 mg by mouth 3 times/day before meals and at bedtime prn 09/09/23 10/09/23  Pyrtle, Carie Caddy, MD  Multiple Vitamins-Minerals (MULTIVITAMIN ADULT) CHEW Chew 1 tablet by mouth daily.    [provider]  ondansetron (ZOFRAN) 4 MG tablet Take 1 tablet (4 mg total) by mouth every 6 (six) hours. 04/24/23   Maury Dus, MD  polyethylene glycol powder (GLYCOLAX/MIRALAX) 17 GM/SCOOP powder Take 17 g by mouth daily. 09/04/23   Dameron, Nolberto Hanlon, DO   PRESCRIPTION MEDICATION Take 3 tablets by mouth See admin instructions. Glucose Chewable Tablet - Give 3 tablets by mouth every 15 minutes as needed for blood glucose less than 70. Repeat if blood glucose is still below 100    [provider]  rosuvastatin (CRESTOR) 10 MG tablet Take 1 tablet (10 mg total) by mouth daily. 11/15/20   Just, Azalee Course, FNP  solifenacin (VESICARE) 10 MG tablet Take 1 tablet (10 mg total) by mouth daily. 08/21/22   Darral Dash, DO  Allergies Asa [aspirin], Ibuprofen, Ciprofloxacin, and Sulfa antibiotics  Review of Systems Review of Systems As noted in HPI  Physical Exam Vital Signs  I have reviewed the triage vital signs BP (!) 142/76   Pulse 95   Temp 97.8 F (36.6 C)   Resp 14   SpO2 98%  *** Physical Exam  ED Results and Treatments Labs (all labs ordered are listed, but only abnormal results are displayed) Labs Reviewed - No data to display                                                                                                                       EKG  EKG Interpretation Date/Time:    Ventricular Rate:    PR Interval:    QRS Duration:    QT Interval:    QTC Calculation:   R Axis:      Text Interpretation:         Radiology No results found.  Medications Ordered in ED Medications - No data to display Procedures Procedures  (including critical care time) Medical Decision Making / ED Course   Medical Decision Making   ***    Final Clinical Impression(s) / ED Diagnoses Final diagnoses:  None    This chart was dictated using voice recognition software.  Despite best efforts to proofread,  errors can occur which can change the documentation meaning.

## 2023-10-22 NOTE — ED Provider Notes (Signed)
[place] EMERGENCY DEPARTMENT AT [facility] Provider Note  CSN: [id number] Arrival date & time: 10/22/23 1555  Chief Complaint(s) Leg Pain  HPI Judith [name] is a 82 y.o. female with a past medical history listed below who presents to the emergency department with several weeks of bilateral thigh pain.  Patient has seen urgent care twice for this.  Labs and imaging were obtained that did not show any electrolyte derangements.  No acute fractures of the lumbar spine or hips.  It did show remote L1 compression fracture and mild bilateral hip arthritis.  She denied any associated falls.  Patient has had decreased activity in that timeframe due to the pain.  She denies any numbness or tingling.  No lower extremity weakness or loss of sensation.  No bladder/bowel incontinence.  The history is provided by the patient and a relative.    Past Medical History Past Medical History:  Diagnosis Date   Allergy 1990   Anemia    Arthritis    Biliary gastritis    Blood transfusion without reported diagnosis    2007   Cervical spine arthritis 04/05/2016   Choledocholithiasis    Controlled type 2 diabetes mellitus with diabetic neuropathy, with long-term current use of insulin (HCC)    Depression    Diabetes mellitus type 2 with complications (HCC)    Dilated bile duct    Duodenal ulcer disease    Encounter for long-term (current) use of insulin (HCC)    Gallstones 01/17/2018   Gastric ulcer    Gastritis and gastroduodenitis    Gastroparesis    severe   GERD (gastroesophageal reflux disease)    Gout    tested for gout but was not   Heart murmur    Hiatal hernia    Hyperlipidemia    in past not now   Hypertension    Hypokalemia, gastrointestinal losses    secondary to severe gastroparesis   Internal hemorrhoids    Long-term insulin use in type 2 diabetes (HCC) 04/05/2016   Osteoporosis    just had bone density test possible    Peripheral neuropathy    Presence of  cardiac pacemaker    S/P partial gastrectomy    due to severe gastric and gudoenal ulcers   Seasonal allergies    Sleep apnea    could not use CPAP   UTI (urinary tract infection)    Patient Active Problem List   Diagnosis Date Noted   Pulmonary nodule 06/17/2023   Abdominal pain 06/13/2023   Pacemaker battery depletion 04/25/2023   Fall 01/11/2023   UTI (urinary tract infection) 12/28/2022   Postnasal drip 11/01/2022   Urinary incontinence 08/22/2022   Encounter to establish care with new doctor 08/07/2022   Choledocholithiasis 11/28/2021   Sleep apnea    Hyperlipidemia    Type 2 diabetes mellitus with hyperglycemia (HCC)    Polycythemia    Unspecified inflammatory spondylopathy, cervical region (HCC) 11/15/2020   Multilevel degenerative disc disease 04/03/2018   Acute blood loss anemia 01/17/2018   Gallstones 01/17/2018   Unintentional weight loss 01/17/2018   Dilated bile duct    Elevated liver function tests    Gastritis and gastroduodenitis    S/P partial gastrectomy    Peripheral neuropathy    Benign essential HTN    Gastroparesis    Duodenal ulcer disease    Controlled type 2 diabetes mellitus with diabetic neuropathy, with long-term current use of insulin (HCC)    Depression    Biliary  gastritis    Seasonal allergies    Presence of cardiac pacemaker    Gout    Cervical spine arthritis 04/05/2016   Long-term insulin use in type 2 diabetes (HCC) 04/05/2016   Home Medication(s) Prior to Admission medications   Medication Sig Start Date End Date Taking? Authorizing Provider  insulin glargine (LANTUS) 100 unit/mL SOPN Inject 18 Units into the skin daily. 05/18/23  Yes Maxwell, Allee, MD  insulin lispro (HUMALOG KWIKPEN) 100 UNIT/ML KwikPen Inject 0-12 Units into the skin 3 (three) times daily before meals. 80-150 = 8 units, 151-200 = 9 units, 201-250 = 10 units, 251-300 = 11 units, 301-350 = 14 units, 351-400 = 16 units and recheck at next meal, and call MD if  multiple readings > 350 in a day. 05/27/23  Yes Dameron, Nolberto Hanlon, DO  acetaminophen (TYLENOL) 500 MG tablet Take 1 tablet (500 mg total) by mouth every 6 (six) hours as needed. 05/27/23   Dameron, Nolberto Hanlon, DO  Biotin 1000 MCG tablet Take 1,000 mcg by mouth daily.     [provider]  carvedilol (COREG) 12.5 MG tablet Take 1 tablet (12.5 mg total) by mouth 2 (two) times daily with a meal. 05/17/23   McDiarmid, Leighton Roach, MD  cetirizine (ZYRTEC) 10 MG tablet Take 10 mg by mouth daily as needed (itching). Patient not taking: Reported on 08/01/2023    [provider]  colchicine 0.6 MG tablet Take 0.6 mg by mouth daily.    [provider]  DULoxetine (CYMBALTA) 60 MG capsule Take 60 mg by mouth daily.    [provider]  famotidine (PEPCID) 20 MG tablet Take 1 tablet (20 mg total) by mouth at bedtime. 06/11/19   Noni Saupe, MD  fluocinonide-emollient (LIDEX-E) 0.05 % cream Apply 1 Application topically See admin instructions. "Apply to stomach topically every 24 hours as needed for at bedtime" Patient not taking: Reported on 08/01/2023    [provider]  fluticasone (FLONASE) 50 MCG/ACT nasal spray Place 2 sprays into both nostrils daily. 11/01/22   Dameron, Nolberto Hanlon, DO  gabapentin (NEURONTIN) 100 MG capsule Take 100-400 mg by mouth See admin instructions. Take 100mg  in the AM, 100mg  in the afternoon, and 400mg  at bedtime.    [provider]  Insulin Pen Needle (B-D ULTRAFINE III SHORT PEN) 31G X 8 MM MISC To use as directed with insulin, four times a day. Dx E11.9, Z79.4 11/08/17   Lezlie Lye, Meda Coffee, MD  LIDOCAINE EX Apply 1 patch topically daily. Apply to lower back topically in the morning for back pain and remove per schedule    [provider]  losartan (COZAAR) 50 MG tablet Take 1 tablet (50 mg total) by mouth daily. 08/30/22   Moses Manners, MD  metoCLOPramide (REGLAN) 10 MG tablet Take 1 tablet (10 mg total) by mouth 4 (four) times  daily -  before meals and at bedtime. Take 1 tablet 10 mg by mouth 3 times/day before meals and at bedtime prn 09/09/23 10/09/23  Pyrtle, Carie Caddy, MD  Multiple Vitamins-Minerals (MULTIVITAMIN ADULT) CHEW Chew 1 tablet by mouth daily.    [provider]  ondansetron (ZOFRAN) 4 MG tablet Take 1 tablet (4 mg total) by mouth every 6 (six) hours. 04/24/23   Maury Dus, MD  polyethylene glycol powder (GLYCOLAX/MIRALAX) 17 GM/SCOOP powder Take 17 g by mouth daily. 09/04/23   Dameron, Nolberto Hanlon, DO  PRESCRIPTION MEDICATION Take 3 tablets by mouth See admin instructions. Glucose Chewable Tablet -  Give 3 tablets by mouth every 15 minutes as needed for blood glucose less than 70. Repeat if blood glucose is still below 100    [provider]  rosuvastatin (CRESTOR) 10 MG tablet Take 1 tablet (10 mg total) by mouth daily. 11/15/20   Just, Azalee Course, FNP  solifenacin (VESICARE) 10 MG tablet Take 1 tablet (10 mg total) by mouth daily. 08/21/22   Dameron, Nolberto Hanlon, DO                                                                                                                                    Allergies Asa [aspirin], Ibuprofen, Ciprofloxacin, and Sulfa antibiotics  Review of Systems Review of Systems As noted in HPI  Physical Exam Vital Signs  I have reviewed the triage vital signs BP (!) 142/76   Pulse 95   Temp 97.8 F (36.6 C)   Resp 14   SpO2 98%   Physical Exam Vitals reviewed.  Constitutional:      General: She is not in acute distress.    Appearance: She is well-developed. She is not diaphoretic.  HENT:     Head: Normocephalic and atraumatic.     Right Ear: External ear normal.     Left Ear: External ear normal.     Nose: Nose normal.  Eyes:     General: No scleral icterus.    Conjunctiva/sclera: Conjunctivae normal.  Neck:     Trachea: Phonation normal.  Cardiovascular:     Rate and Rhythm: Normal rate and regular rhythm.  Pulmonary:     Effort: Pulmonary effort is  normal. No respiratory distress.     Breath sounds: No stridor.  Abdominal:     General: There is no distension.  Musculoskeletal:        General: Normal range of motion.     Cervical back: Normal range of motion.     Right upper leg: Tenderness (mild) present. No bony tenderness.     Left upper leg: Tenderness (mild) present. No bony tenderness.       Legs:     Comments: Spine Exam: Strength: 5/5 throughout LE bilaterally (hip flexion/extension, adduction/abduction; knee flexion/extension; foot dorsiflexion/plantarflexion, inversion/eversion; great toe inversion) Sensation: Intact to light touch in proximal and distal LE bilaterally    Neurological:     Mental Status: She is alert and oriented to person, place, and time.  Psychiatric:        Behavior: Behavior normal.     ED Results and Treatments Labs (all labs ordered are listed, but only abnormal results are displayed) Labs Reviewed - No data to display  EKG  EKG Interpretation Date/Time:    Ventricular Rate:    PR Interval:    QRS Duration:    QT Interval:    QTC Calculation:   R Axis:      Text Interpretation:         Radiology No results found.  Medications Ordered in ED Medications  ketorolac (TORADOL) injection 30 mg (has no administration in time range)   Procedures Procedures  (including critical care time) Medical Decision Making / ED Course   Medical Decision Making Amount and/or Complexity of Data Reviewed External Data Reviewed: labs, radiology and notes.     Bilateral thigh pain. Nontraumatic.  Previous x-rays negative.  No evidence concerning for cauda equina. no need for repeat imaging Recent labs without electrolyte derangements. Patient has appointment tomorrow to get labs to check Vit and CK levels. No emergent need for labs tonight. Provided with IM toradol for  now. Recommended keeping appt with PCP to get labs, discuss medication adjustments, and outpatient PT.      Final Clinical Impression(s) / ED Diagnoses Final diagnoses:  Bilateral thigh pain   The patient appears reasonably screened and/or stabilized for discharge and I doubt any other medical condition or other [facility] requiring further screening, evaluation, or treatment in the ED at this time. I have discussed the findings, Dx and Tx plan with the patient/family who expressed understanding and agree(s) with the plan. Discharge instructions discussed at length. The patient/family was given strict return precautions who verbalized understanding of the instructions. No further questions at time of discharge.  Disposition: Discharge  Condition: Good  ED Discharge Orders     None         Follow Up: Primary care provider  Go today as scheduled     This chart was dictated using voice recognition software.  Despite best efforts to proofread,  errors can occur which can change the documentation meaning.    Nira Conn, MD 10/23/23 [phone]

## 2023-10-22 NOTE — ED Triage Notes (Signed)
Pt to ED POV from [place]. Pt states she has a hx of osteoarthritis and c/o bilateral upper leg pain x a few weeks. Pt denies any injuries. Pt states she has not seen anyone out pt for pain yet. Pt denies any redness / swelling to legs. Pt states she has taken tylenol at home with minimal relief. Pt has range of motion on bilateral lower extremities.

## 2023-10-23 ENCOUNTER — Ambulatory Visit: Payer: Medicare Other | Admitting: Family Medicine

## 2023-10-23 DIAGNOSIS — M79605 Pain in left leg: Secondary | ICD-10-CM | POA: Diagnosis not present

## 2023-10-23 NOTE — Discharge Instructions (Signed)
Discuss medication adjustments with PCP for leg pain.  Please try to get physical activity as this can provide some relief of pain as well.

## 2023-10-27 ENCOUNTER — Encounter: Payer: Self-pay | Admitting: Student

## 2023-10-28 ENCOUNTER — Encounter (HOSPITAL_COMMUNITY): Payer: Self-pay

## 2023-10-28 ENCOUNTER — Emergency Department (HOSPITAL_COMMUNITY)
Admission: EM | Admit: 2023-10-28 | Discharge: 2023-10-29 | Disposition: A | Discharge status: Home / Self Care | Payer: Medicare Other | Attending: Emergency Medicine | Admitting: Emergency Medicine

## 2023-10-28 ENCOUNTER — Other Ambulatory Visit: Payer: Self-pay

## 2023-10-28 ENCOUNTER — Telehealth: Payer: Self-pay

## 2023-10-28 ENCOUNTER — Ambulatory Visit (INDEPENDENT_AMBULATORY_CARE_PROVIDER_SITE_OTHER): Payer: Medicare Other

## 2023-10-28 DIAGNOSIS — M79604 Pain in right leg: Secondary | ICD-10-CM | POA: Diagnosis not present

## 2023-10-28 DIAGNOSIS — I10 Essential (primary) hypertension: Secondary | ICD-10-CM | POA: Diagnosis present

## 2023-10-28 DIAGNOSIS — F322 Major depressive disorder, single episode, severe without psychotic features: Secondary | ICD-10-CM | POA: Diagnosis not present

## 2023-10-28 DIAGNOSIS — E871 Hypo-osmolality and hyponatremia: Secondary | ICD-10-CM | POA: Insufficient documentation

## 2023-10-28 DIAGNOSIS — D72829 Elevated white blood cell count, unspecified: Secondary | ICD-10-CM | POA: Insufficient documentation

## 2023-10-28 DIAGNOSIS — Z79899 Other long term (current) drug therapy: Secondary | ICD-10-CM | POA: Insufficient documentation

## 2023-10-28 DIAGNOSIS — M79605 Pain in left leg: Secondary | ICD-10-CM | POA: Diagnosis present

## 2023-10-28 DIAGNOSIS — R45851 Suicidal ideations: Secondary | ICD-10-CM

## 2023-10-28 DIAGNOSIS — Z794 Long term (current) use of insulin: Secondary | ICD-10-CM | POA: Diagnosis not present

## 2023-10-28 DIAGNOSIS — I442 Atrioventricular block, complete: Secondary | ICD-10-CM

## 2023-10-28 DIAGNOSIS — M47812 Spondylosis without myelopathy or radiculopathy, cervical region: Secondary | ICD-10-CM | POA: Diagnosis present

## 2023-10-28 DIAGNOSIS — G629 Polyneuropathy, unspecified: Secondary | ICD-10-CM

## 2023-10-28 DIAGNOSIS — E119 Type 2 diabetes mellitus without complications: Secondary | ICD-10-CM | POA: Diagnosis not present

## 2023-10-28 LAB — CUP PACEART REMOTE DEVICE CHECK
Battery Remaining Longevity: 90 mo
Battery Remaining Percentage: 100 %
Brady Statistic RA Percent Paced: 4 %
Brady Statistic RV Percent Paced: 100 %
Date Time Interrogation Session: 20241104003100
Lead Channel Impedance Value: 540 Ohm
Lead Channel Impedance Value: 674 Ohm
Lead Channel Pacing Threshold Amplitude: 0.4 V
Lead Channel Pacing Threshold Amplitude: 0.7 V
Lead Channel Pacing Threshold Pulse Width: 0.4 ms
Lead Channel Pacing Threshold Pulse Width: 0.4 ms
Lead Channel Setting Pacing Amplitude: 2 V
Lead Channel Setting Pacing Amplitude: 2 V
Lead Channel Setting Pacing Pulse Width: 0.4 ms
Lead Channel Setting Sensing Sensitivity: 3.5 mV
Pulse Gen Serial Number: 763022
Zone Setting Status: 755011

## 2023-10-28 LAB — COMPREHENSIVE METABOLIC PANEL WITH GFR
ALT: 19 U/L (ref 0–44)
AST: 31 U/L (ref 15–41)
Albumin: 3.9 g/dL (ref 3.5–5.0)
Alkaline Phosphatase: 44 U/L (ref 38–126)
Anion gap: 11 (ref 5–15)
BUN: 10 mg/dL (ref 8–23)
CO2: 22 mmol/L (ref 22–32)
Calcium: 9.5 mg/dL (ref 8.9–10.3)
Chloride: 97 mmol/L — ABNORMAL LOW (ref 98–111)
Creatinine, Ser: 0.9 mg/dL (ref 0.44–1.00)
GFR, Estimated: >60 mL/min (ref >60)
Glucose, Bld: 83 mg/dL (ref 70–99)
Potassium: 4.7 mmol/L (ref 3.5–5.1)
Sodium: 130 mmol/L — ABNORMAL LOW (ref 135–145)
Total Bilirubin: 0.9 mg/dL (ref <1.2)
Total Protein: 6.7 g/dL (ref 6.5–8.1)

## 2023-10-28 LAB — CBC
HCT: 46.8 % — ABNORMAL HIGH (ref 36.0–46.0)
Hemoglobin: 14.8 g/dL (ref 12.0–15.0)
MCH: 33 pg (ref 26.0–34.0)
MCHC: 31.6 g/dL (ref 30.0–36.0)
MCV: 104.2 fL — ABNORMAL HIGH (ref 80.0–100.0)
Platelets: 283 K/uL (ref 150–400)
RBC: 4.49 MIL/uL (ref 3.87–5.11)
RDW: 14 % (ref 11.5–15.5)
WBC: 11.5 K/uL — ABNORMAL HIGH (ref 4.0–10.5)
nRBC: 0 % (ref 0.0–0.2)

## 2023-10-28 LAB — ACETAMINOPHEN LEVEL: Acetaminophen (Tylenol), Serum: 12 ug/mL (ref 10–30)

## 2023-10-28 LAB — ETHANOL: Alcohol, Ethyl (B): <10 mg/dL (ref <10)

## 2023-10-28 LAB — SALICYLATE LEVEL: Salicylate Lvl: <7 mg/dL — ABNORMAL LOW (ref 7.0–30.0)

## 2023-10-28 NOTE — Telephone Encounter (Signed)
Received returned call from [place].   She reports that daughter is wanting evaluation ASAP, as patient is stating she is going to "starve herself to death."   Spoke with Dr. Miquel Dunn. Received verbal orders to send patient to hospital for psych evaluation.   Veronda Prude, RN

## 2023-10-28 NOTE — ED Provider Triage Note (Signed)
Emergency Medicine Provider Triage Evaluation Note  Judith [name] , a 82 y.o. female  was evaluated in triage.  Pt complains of bilateral leg pain and refusal to eat.  Review of Systems  Positive: Bilateral leg pain, decreased appetite Negative: Abdominal pain, nausea vomiting diarrhea  Physical Exam  BP 124/76   Pulse 86   Temp (!) 97.4 F (36.3 C)   Resp 16   SpO2 96%  Gen:   Awake, no distress   Resp:  Normal effort  MSK:   Moves extremities without difficulty  Other:    Medical Decision Making  Medically screening exam initiated at 8:01 PM.  Appropriate orders placed.  [name] was informed that the remainder of the evaluation will be completed by another provider, this initial triage assessment does not replace that evaluation, and the importance of remaining in the ED until their evaluation is complete.  Patient reporting with multiple weeks of lower extremity pain.  Patient has been seen in urgent care for this same thing.  Patient has also been seen by emergency room.  Patient has history of hypothyroid and diabetes.  Because patient is refusing to eat and drink she has been hypoglycemic as well.  Patient was seen by primary care and sent here for psychiatric evaluation.   Smitty Knudsen, Cordelia Poche 10/28/23 2003

## 2023-10-28 NOTE — ED Triage Notes (Signed)
Pt presents via POV from [facility] s/o bilateral leg pain. Daughter reports pt is refusing to eat and drink. Paperwork from daughter requesting pt to have psychiatric evaluations due to refusal to eat. Pt A&Ox4. Calm and cooperative in triage.

## 2023-10-28 NOTE — ED Provider Notes (Incomplete)
[place] EMERGENCY DEPARTMENT AT [facility] Provider Note   CSN: [id number] Arrival date & time: 10/28/23  1803     History {Add pertinent medical, surgical, social history, OB history to HPI:1} Chief Complaint  Patient presents with  . Leg Pain    Judith [name] is a 82 y.o. female with past medical history significant for arthritis, gout, hypertension, gastroparesis, diabetes, remote compression fracture who presents with concern for bilateral leg pain which is worsening over the last month.  Patient now refusing to eat and drink, reports she has not had anything to eat or drink for 8 days.  She was sent from her care facility for psychiatric evaluation due to "refusal to eat", as well as expressing that she is trying to "starve herself to death".  Patient does report that if her pain was under better control she would not be feeling this way, she denies any fever, chills, recent trauma, history of IV drug use. Denies saddle anesthesia, urinary or fecal incontinence.   Leg Pain      Home Medications Prior to Admission medications   Medication Sig Start Date End Date Taking? Authorizing Provider  acetaminophen (TYLENOL) 500 MG tablet Take 1 tablet (500 mg total) by mouth every 6 (six) hours as needed. 05/27/23   Dameron, Nolberto Hanlon, DO  Biotin 1000 MCG tablet Take 1,000 mcg by mouth daily.     [provider]  carvedilol (COREG) 12.5 MG tablet Take 1 tablet (12.5 mg total) by mouth 2 (two) times daily with a meal. 05/17/23   McDiarmid, Leighton Roach, MD  cetirizine (ZYRTEC) 10 MG tablet Take 10 mg by mouth daily as needed (itching). Patient not taking: Reported on 08/01/2023    [provider]  colchicine 0.6 MG tablet Take 0.6 mg by mouth daily.    [provider]  DULoxetine (CYMBALTA) 60 MG capsule Take 60 mg by mouth daily.    [provider]  famotidine (PEPCID) 20 MG tablet Take 1 tablet (20 mg total) by mouth at bedtime. 06/11/19   Noni Saupe, MD  fluocinonide-emollient (LIDEX-E) 0.05 % cream Apply 1 Application topically See admin instructions. "Apply to stomach topically every 24 hours as needed for at bedtime" Patient not taking: Reported on 08/01/2023    [provider]  fluticasone (FLONASE) 50 MCG/ACT nasal spray Place 2 sprays into both nostrils daily. 11/01/22   Dameron, Nolberto Hanlon, DO  gabapentin (NEURONTIN) 100 MG capsule Take 100-400 mg by mouth See admin instructions. Take 100mg  in the AM, 100mg  in the afternoon, and 400mg  at bedtime.    [provider]  insulin glargine (LANTUS) 100 unit/mL SOPN Inject 18 Units into the skin daily. 05/18/23   Alfredo Martinez, MD  insulin lispro (HUMALOG KWIKPEN) 100 UNIT/ML KwikPen Inject 0-12 Units into the skin 3 (three) times daily before meals. 80-150 = 8 units, 151-200 = 9 units, 201-250 = 10 units, 251-300 = 11 units, 301-350 = 14 units, 351-400 = 16 units and recheck at next meal, and call MD if multiple readings > 350 in a day. 05/27/23   Dameron, Nolberto Hanlon, DO  Insulin Pen Needle (B-D ULTRAFINE III SHORT PEN) 31G X 8 MM MISC To use as directed with insulin, four times a day. Dx E11.9, Z79.4 11/08/17   Lezlie Lye, Meda Coffee, MD  LIDOCAINE EX Apply 1 patch topically daily. Apply to lower back topically in the morning for back pain and remove per schedule    [provider]  losartan (COZAAR) 50 MG tablet Take 1 tablet (50 mg total) by mouth daily. 08/30/22   Moses Manners, MD  metoCLOPramide (REGLAN) 10 MG tablet Take 1 tablet (10 mg total) by mouth 4 (four) times daily -  before meals and at bedtime. Take 1 tablet 10 mg by mouth 3 times/day before meals and at bedtime prn 09/09/23 10/09/23  Pyrtle, Carie Caddy, MD  Multiple Vitamins-Minerals (MULTIVITAMIN ADULT) CHEW Chew 1 tablet by mouth daily.    [provider]  ondansetron (ZOFRAN) 4 MG tablet Take 1 tablet (4 mg total) by mouth every 6 (six) hours. 04/24/23   Maury Dus, MD  polyethylene glycol  powder (GLYCOLAX/MIRALAX) 17 GM/SCOOP powder Take 17 g by mouth daily. 09/04/23   Dameron, Nolberto Hanlon, DO  PRESCRIPTION MEDICATION Take 3 tablets by mouth See admin instructions. Glucose Chewable Tablet - Give 3 tablets by mouth every 15 minutes as needed for blood glucose less than 70. Repeat if blood glucose is still below 100    [provider]  rosuvastatin (CRESTOR) 10 MG tablet Take 1 tablet (10 mg total) by mouth daily. 11/15/20   Just, Azalee Course, FNP  solifenacin (VESICARE) 10 MG tablet Take 1 tablet (10 mg total) by mouth daily. 08/21/22   Dameron, Nolberto Hanlon, DO      Allergies    Asa [aspirin], Ibuprofen, Ciprofloxacin, and Sulfa antibiotics    Review of Systems   Review of Systems  All other systems reviewed and are negative.   Physical Exam Updated Vital Signs BP (!) 159/76 (BP Location: Right Arm)   Pulse 72   Temp 98.2 F (36.8 C) (Oral)   Resp 16   SpO2 98%  Physical Exam Vitals and nursing note reviewed.  Constitutional:      General: She is not in acute distress.    Appearance: Normal appearance.  HENT:     Head: Normocephalic and atraumatic.  Eyes:     General:        Right eye: No discharge.        Left eye: No discharge.  Cardiovascular:     Rate and Rhythm: Normal rate and regular rhythm.     Pulses: Normal pulses.     Heart sounds: No murmur heard.    No friction rub. No gallop.     Comments: DP, PT pulses 2+ in the affected bilateral lower extremities. Pulmonary:     Effort: Pulmonary effort is normal.     Breath sounds: Normal breath sounds.  Abdominal:     General: Bowel sounds are normal.     Palpations: Abdomen is soft.  Musculoskeletal:     Comments: Intact strength 4/5 bilateral lower extremities, patient with decreased strength, likely secondary to effort.  Normal external appearance of legs.  No ecchymosis.  Skin:    General: Skin is warm and dry.     Capillary Refill: Capillary refill takes less than 2 seconds.  Neurological:     Mental  Status: She is alert and oriented to person, place, and time.  Psychiatric:        Mood and Affect: Mood normal.        Behavior: Behavior normal.     ED Results / Procedures / Treatments   Labs (all labs ordered are listed, but only abnormal results are displayed) Labs Reviewed  COMPREHENSIVE METABOLIC PANEL - Abnormal; Notable for the following components:      Result Value   Sodium 130 (*)    Chloride 97 (*)  All other components within normal limits  SALICYLATE LEVEL - Abnormal; Notable for the following components:   Salicylate Lvl <7.0 (*)    All other components within normal limits  CBC - Abnormal; Notable for the following components:   WBC 11.5 (*)    HCT 46.8 (*)    MCV 104.2 (*)    All other components within normal limits  ETHANOL  ACETAMINOPHEN LEVEL  RAPID URINE DRUG SCREEN, HOSP PERFORMED    EKG None  Radiology CUP PACEART REMOTE DEVICE CHECK  Result Date: 10/28/2023 Scheduled remote reviewed. Normal device function.  Next remote 91 days. ML, CVRS   Procedures Procedures  {Document cardiac monitor, telemetry assessment procedure when appropriate:1}  Medications Ordered in ED Medications - No data to display  ED Course/ Medical Decision Making/ A&P   {   Click here for ABCD2, HEART and other calculatorsREFRESH Note before signing :1}                              Medical Decision Making Amount and/or Complexity of Data Reviewed Labs: ordered.   This patient is a 82 y.o. female  who presents to the ED for concern of ***.   Differential diagnoses prior to evaluation: The emergent differential diagnosis includes, but is not limited to,  *** . This is not an exhaustive differential.   Past Medical History / Co-morbidities / Social History: ***  Additional history: Chart reviewed. Pertinent results include: ***  Physical Exam: Physical exam performed. The pertinent findings include: ***  Lab Tests/Imaging studies: I personally  interpreted labs/imaging and the pertinent results include:  ***. ***I agree with the radiologist interpretation.  Cardiac monitoring: EKG obtained and interpreted by myself and attending physician which shows: ***   Medications: I ordered medication including ***.  I have reviewed the patients home medicines and have made adjustments as needed.   Disposition: After consideration of the diagnostic results and the patients response to treatment, I feel that *** .   ***emergency department workup does not suggest an emergent condition requiring admission or immediate intervention beyond what has been performed at this time. The plan is: ***. The patient is safe for discharge and has been instructed to return immediately for worsening symptoms, change in symptoms or any other concerns.  Final Clinical Impression(s) / ED Diagnoses Final diagnoses:  None    Rx / DC Orders ED Discharge Orders     None

## 2023-10-28 NOTE — ED Triage Notes (Signed)
Pt currently denies SI/HI 

## 2023-10-28 NOTE — Telephone Encounter (Signed)
Patients daughter calls nurse line in regards to order for psychology evaluation.   [name] reports [facility] sent over a fax for PCP to sign off on in regards to PSY evaluation.   Of note, [facility] (Deseree) calls right after [name] in regards to order.   Advised will send to PCP. Please let me know if you have not received this. I think the main concern is this evaluation will need to be done outside of [place]. However, the patient is refusing treatment and/or leave her bed.   Deseree asks we call her to give update once order is signed. [phone].

## 2023-10-28 NOTE — ED Provider Notes (Signed)
[place] EMERGENCY DEPARTMENT AT [facility] Provider Note   CSN: [id number] Arrival date & time: 10/28/23  1803     History  Chief Complaint  Patient presents with   Leg Pain    Judith [name] is a 82 y.o. female with past medical history significant for arthritis, gout, hypertension, gastroparesis, diabetes, remote compression fracture who presents with concern for bilateral leg pain which is worsening over the last month.  Patient now refusing to eat and drink, reports she has not had anything to eat or drink for 8 days.  She was sent from her care facility for psychiatric evaluation due to "refusal to eat", as well as expressing that she is trying to "starve herself to death".  Patient does report that if her pain was under better control she would not be feeling this way, she denies any fever, chills, recent trauma, history of IV drug use. Denies saddle anesthesia, urinary or fecal incontinence.   Leg Pain      Home Medications Prior to Admission medications   Medication Sig Start Date End Date Taking? Authorizing Provider  acetaminophen (TYLENOL) 500 MG tablet Take 1 tablet (500 mg total) by mouth every 6 (six) hours as needed. 05/27/23   Dameron, Nolberto Hanlon, DO  Biotin 1000 MCG tablet Take 1,000 mcg by mouth daily.     [provider]  carvedilol (COREG) 12.5 MG tablet Take 1 tablet (12.5 mg total) by mouth 2 (two) times daily with a meal. 05/17/23   McDiarmid, Leighton Roach, MD  cetirizine (ZYRTEC) 10 MG tablet Take 10 mg by mouth daily as needed (itching). Patient not taking: Reported on 08/01/2023    [provider]  colchicine 0.6 MG tablet Take 0.6 mg by mouth daily.    [provider]  DULoxetine (CYMBALTA) 60 MG capsule Take 60 mg by mouth daily.    [provider]  famotidine (PEPCID) 20 MG tablet Take 1 tablet (20 mg total) by mouth at bedtime. 06/11/19   Noni Saupe, MD  fluocinonide-emollient (LIDEX-E) 0.05 % cream Apply 1  Application topically See admin instructions. "Apply to stomach topically every 24 hours as needed for at bedtime" Patient not taking: Reported on 08/01/2023    [provider]  fluticasone (FLONASE) 50 MCG/ACT nasal spray Place 2 sprays into both nostrils daily. 11/01/22   Dameron, Nolberto Hanlon, DO  gabapentin (NEURONTIN) 100 MG capsule Take 100-400 mg by mouth See admin instructions. Take 100mg  in the AM, 100mg  in the afternoon, and 400mg  at bedtime.    [provider]  insulin glargine (LANTUS) 100 unit/mL SOPN Inject 18 Units into the skin daily. 05/18/23   Alfredo Martinez, MD  insulin lispro (HUMALOG KWIKPEN) 100 UNIT/ML KwikPen Inject 0-12 Units into the skin 3 (three) times daily before meals. 80-150 = 8 units, 151-200 = 9 units, 201-250 = 10 units, 251-300 = 11 units, 301-350 = 14 units, 351-400 = 16 units and recheck at next meal, and call MD if multiple readings > 350 in a day. 05/27/23   Dameron, Nolberto Hanlon, DO  Insulin Pen Needle (B-D ULTRAFINE III SHORT PEN) 31G X 8 MM MISC To use as directed with insulin, four times a day. Dx E11.9, Z79.4 11/08/17   Lezlie Lye, Meda Coffee, MD  LIDOCAINE EX Apply 1 patch topically daily. Apply to lower back topically in the morning for back pain and remove per schedule    [provider]  losartan (COZAAR) 50 MG tablet Take 1 tablet (50  mg total) by mouth daily. 08/30/22   Moses Manners, MD  metoCLOPramide (REGLAN) 10 MG tablet Take 1 tablet (10 mg total) by mouth 4 (four) times daily -  before meals and at bedtime. Take 1 tablet 10 mg by mouth 3 times/day before meals and at bedtime prn 09/09/23 10/09/23  Pyrtle, Carie Caddy, MD  Multiple Vitamins-Minerals (MULTIVITAMIN ADULT) CHEW Chew 1 tablet by mouth daily.    [provider]  ondansetron (ZOFRAN) 4 MG tablet Take 1 tablet (4 mg total) by mouth every 6 (six) hours. 04/24/23   Maury Dus, MD  polyethylene glycol powder (GLYCOLAX/MIRALAX) 17 GM/SCOOP powder Take 17 g by mouth daily.  09/04/23   Dameron, Nolberto Hanlon, DO  PRESCRIPTION MEDICATION Take 3 tablets by mouth See admin instructions. Glucose Chewable Tablet - Give 3 tablets by mouth every 15 minutes as needed for blood glucose less than 70. Repeat if blood glucose is still below 100    [provider]  rosuvastatin (CRESTOR) 10 MG tablet Take 1 tablet (10 mg total) by mouth daily. 11/15/20   Just, Azalee Course, FNP  solifenacin (VESICARE) 10 MG tablet Take 1 tablet (10 mg total) by mouth daily. 08/21/22   Dameron, Nolberto Hanlon, DO      Allergies    Asa [aspirin], Ibuprofen, Ciprofloxacin, and Sulfa antibiotics    Review of Systems   Review of Systems  All other systems reviewed and are negative.   Physical Exam Updated Vital Signs BP (!) 146/70 (BP Location: Right Arm)   Pulse 74   Temp 97.9 F (36.6 C) (Oral)   Resp 16   SpO2 95%  Physical Exam Vitals and nursing note reviewed.  Constitutional:      General: She is not in acute distress.    Appearance: Normal appearance.  HENT:     Head: Normocephalic and atraumatic.  Eyes:     General:        Right eye: No discharge.        Left eye: No discharge.  Cardiovascular:     Rate and Rhythm: Normal rate and regular rhythm.     Pulses: Normal pulses.     Heart sounds: No murmur heard.    No friction rub. No gallop.     Comments: DP, PT pulses 2+ in the affected bilateral lower extremities. Pulmonary:     Effort: Pulmonary effort is normal.     Breath sounds: Normal breath sounds.  Abdominal:     General: Bowel sounds are normal.     Palpations: Abdomen is soft.  Musculoskeletal:     Comments: Intact strength 4/5 bilateral lower extremities, patient with decreased strength, likely secondary to effort.  Normal external appearance of legs.  No ecchymosis.  Skin:    General: Skin is warm and dry.     Capillary Refill: Capillary refill takes less than 2 seconds.  Neurological:     Mental Status: She is alert and oriented to person, place, and time.   Psychiatric:        Mood and Affect: Mood normal.        Behavior: Behavior normal.     ED Results / Procedures / Treatments   Labs (all labs ordered are listed, but only abnormal results are displayed) Labs Reviewed  COMPREHENSIVE METABOLIC PANEL - Abnormal; Notable for the following components:      Result Value   Sodium 130 (*)    Chloride 97 (*)    All other components within normal limits  SALICYLATE  LEVEL - Abnormal; Notable for the following components:   Salicylate Lvl <7.0 (*)    All other components within normal limits  CBC - Abnormal; Notable for the following components:   WBC 11.5 (*)    HCT 46.8 (*)    MCV 104.2 (*)    All other components within normal limits  ETHANOL  ACETAMINOPHEN LEVEL  RAPID URINE DRUG SCREEN, HOSP PERFORMED  URINALYSIS, ROUTINE W REFLEX MICROSCOPIC    EKG None  Radiology CT Lumbar Spine Wo Contrast  Result Date: 10/29/2023 CLINICAL DATA:  Low back pain, symptoms persist with > 6 wks treatment EXAM: CT LUMBAR SPINE WITHOUT CONTRAST TECHNIQUE: Multidetector CT imaging of the lumbar spine was performed without intravenous contrast administration. Multiplanar CT image reconstructions were also generated. RADIATION DOSE REDUCTION: This exam was performed according to the departmental dose-optimization program which includes automated exposure control, adjustment of the mA and/or kV according to patient size and/or use of iterative reconstruction technique. COMPARISON:  01/07/2023 FINDINGS: Segmentation: 5 lumbar type vertebrae. Alignment: Normal lumbar lordosis. Stable lumbar levoscoliosis, apex left at L2 of approximately 21 degrees Vertebrae: Interval superior endplate fracture of L1 with approximately 30-40% loss of height anteriorly. No retropulsion. This appears stable since intervening CT examination of the chest, abdomen, and pelvis of 06/13/2023. No acute fracture of the lumbar spine. No lytic or blastic bone lesion. Paraspinal and other  soft tissues: Negative. Disc levels: There is diffuse intervertebral disc space narrowing, vacuum disc phenomena, and endplate changes identified throughout the lumbar spine in keeping with changes of diffuse severe degenerative disc disease. Axial images demonstrate: T12-L1: Normal L1-2: Normal L2-3: Normal. L3-4: Mild to moderate right neuroforaminal narrowing secondary to scoliosis and posterior vertebral osteophytic ridging. Mild bilateral facet arthrosis. Resultant mild central canal stenosis. L4-5: Posterior osteophytic ridging in combination with mild bilateral facet arthrosis and hypertrophy results in mild to moderate central canal stenosis within the sub foraminal zone. Neural foramen are widely patent. L5-S1: Mild left neuroforaminal narrowing. Broad-based disc bulge and laminar degenerative ossification results in mild central canal stenosis. Mild bilateral facet arthrosis, left greater than right. IMPRESSION: 1. No acute fracture or listhesis of the lumbar spine. 2. Stable superior endplate fracture of L1 with approximately 30-40% loss of height anteriorly. No retropulsion. This appears stable since intervening CT examination of the chest, abdomen, and pelvis of 06/13/2023. 3. Stable lumbar levoscoliosis, apex left at L2 of approximately 21 degrees. 4. Diffuse severe degenerative disc disease. 5. Mild to moderate central canal stenosis at L4-5 within the sub foraminal zone. Mild central canal stenosis at L3-4 and L5-S1. Electronically Signed   By: Helyn Numbers M.D.   On: 10/29/2023 00:27   CUP PACEART REMOTE DEVICE CHECK  Result Date: 10/28/2023 Scheduled remote reviewed. Normal device function.  Next remote 91 days. ML, CVRS   Procedures Procedures    Medications Ordered in ED Medications  carvedilol (COREG) tablet 12.5 mg (has no administration in time range)  DULoxetine (CYMBALTA) DR capsule 60 mg (has no administration in time range)  famotidine (PEPCID) tablet 20 mg (has no  administration in time range)  losartan (COZAAR) tablet 50 mg (has no administration in time range)  metoCLOPramide (REGLAN) tablet 10 mg (has no administration in time range)  multivitamin with minerals tablet 1 tablet (has no administration in time range)  rosuvastatin (CRESTOR) tablet 10 mg (has no administration in time range)  fesoterodine (TOVIAZ) tablet 4 mg (has no administration in time range)  oxyCODONE-acetaminophen (PERCOCET/ROXICET) 5-325 MG per tablet 1  tablet (1 tablet Oral Given 10/29/23 0047)  oxyCODONE (Oxy IR/ROXICODONE) immediate release tablet 10 mg (10 mg Oral Given 10/29/23 0272)    ED Course/ Medical Decision Making/ A&P                                 Medical Decision Making Amount and/or Complexity of Data Reviewed Labs: ordered. Radiology: ordered.  Risk Prescription drug management.   This patient is a 82 y.o. female  who presents to the ED for concern of suicidal ideation, leg pain, back pain.   Differential diagnoses prior to evaluation: The emergent differential diagnosis includes, but is not limited to, new or worsening lumbar compression fracture, central cord compression, cauda equina, active suicidal ideation, homicidal ideation, AVH, or other psychiatric abnormality. This is not an exhaustive differential.   Past Medical History / Co-morbidities / Social History: arthritis, gout, hypertension, gastroparesis, diabetes, remote compression fracture  Additional history: Chart reviewed. Pertinent results include: Reviewed lab work, imaging from previous emergency department visits, previous CT scans of the lumbar spinal region  Physical Exam: Physical exam performed. The pertinent findings include: : Intact strength 4/5 bilateral lower extremities, patient with decreased strength, likely secondary to effort.  Normal external appearance of legs.  No ecchymosis.  DP, PT pulses 2+ in the affected bilateral lower extremities.  Flat affect and depressed  mode. Endorses wanting to end her life. Vital signs stable other than BP 159/76.  Lab Tests/Imaging studies: I personally interpreted labs/imaging and the pertinent results include: CBC mildly elevated, white blood cells 11.5, she has mild hyponatremia, sodium 130.  Normal ethanol, salicylate, acetaminophen level, UDS negative.  I independently interpreted CT lumbar spine without contrast which shows no evidence of new acute change, she is chronic compression fracture and degenerative changes and central canal narrowing in the L-spine I agree with the radiologist interpretation.   Medications: I ordered medication including oxycodone for pain, I ordered patient's home meds.  I have reviewed the patients home medicines and have made adjustments as needed.   Consults: Requested psychiatric consultation secondary to her suicidal ideation secondary to the worsening chronic pain. NP recommended formal MD Psychiatry consult in the morning.  Disposition: After consideration of the diagnostic results and the patients response to treatment, I feel that patient is stable for overnight observation in the emergency department, will follow-up based on psychiatry recommendations.   6:31 AM Care of Judith [name] transferred to [facility] and Dr. Denton Lank at the end of my shift as the patient will require reassessment once labs/imaging have resulted. Patient presentation, ED course, and plan of care discussed with review of all pertinent labs and imaging. Please see his/her note for further details regarding further ED course and disposition. Plan at time of handoff is patient pending psychiatrist evaluation, cleared from medical perspective at this time. This may be altered or completely changed at the discretion of the oncoming team pending results of further workup. Final Clinical Impression(s) / ED Diagnoses Final diagnoses:  None    Rx / DC Orders ED Discharge Orders     None         Olene Floss, PA-C 10/29/23 0636    Gilda Crease, MD 10/29/23 [id number]

## 2023-10-29 ENCOUNTER — Emergency Department (HOSPITAL_COMMUNITY): Payer: Medicare Other

## 2023-10-29 ENCOUNTER — Ambulatory Visit: Payer: Medicare Other | Admitting: Internal Medicine

## 2023-10-29 DIAGNOSIS — R45851 Suicidal ideations: Secondary | ICD-10-CM

## 2023-10-29 DIAGNOSIS — M79605 Pain in left leg: Secondary | ICD-10-CM | POA: Diagnosis not present

## 2023-10-29 LAB — RAPID URINE DRUG SCREEN, HOSP PERFORMED
Amphetamines: NOT DETECTED
Barbiturates: NOT DETECTED
Benzodiazepines: NOT DETECTED
Cocaine: NOT DETECTED
Opiates: NOT DETECTED
Tetrahydrocannabinol: NOT DETECTED

## 2023-10-29 LAB — CBG MONITORING, ED: Glucose-Capillary: 145 mg/dL — ABNORMAL HIGH (ref 70–99)

## 2023-10-29 MED ORDER — OXYCODONE HCL 5 MG PO TABS
10.0000 mg | ORAL_TABLET | Freq: Once | ORAL | Status: AC
  Administered 2023-10-29: 10 mg via ORAL
  Filled 2023-10-29: qty 2

## 2023-10-29 MED ORDER — CARVEDILOL 12.5 MG PO TABS
12.5000 mg | ORAL_TABLET | Freq: Two times a day (BID) | ORAL | Status: DC
  Administered 2023-10-29: 12.5 mg via ORAL
  Filled 2023-10-29: qty 1

## 2023-10-29 MED ORDER — ADULT MULTIVITAMIN W/MINERALS CH
1.0000 | ORAL_TABLET | Freq: Every day | ORAL | Status: DC
  Administered 2023-10-29: 1 via ORAL
  Filled 2023-10-29: qty 1

## 2023-10-29 MED ORDER — HYDROCODONE-ACETAMINOPHEN 5-325 MG PO TABS: 1.0000 | ORAL_TABLET | Freq: Four times a day (QID) | ORAL | 0 refills | Status: DC | PRN

## 2023-10-29 MED ORDER — FAMOTIDINE 20 MG PO TABS: 20.0000 mg | ORAL_TABLET | Freq: Every day | ORAL | Status: DC

## 2023-10-29 MED ORDER — ROSUVASTATIN CALCIUM 5 MG PO TABS
10.0000 mg | ORAL_TABLET | Freq: Every day | ORAL | Status: DC
  Administered 2023-10-29: 10 mg via ORAL
  Filled 2023-10-29: qty 2

## 2023-10-29 MED ORDER — FESOTERODINE FUMARATE ER 4 MG PO TB24
4.0000 mg | ORAL_TABLET | Freq: Every day | ORAL | Status: DC
  Administered 2023-10-29: 4 mg via ORAL
  Filled 2023-10-29: qty 1

## 2023-10-29 MED ORDER — DULOXETINE HCL 60 MG PO CPEP
60.0000 mg | ORAL_CAPSULE | Freq: Every day | ORAL | Status: DC
  Administered 2023-10-29: 60 mg via ORAL
  Filled 2023-10-29: qty 2

## 2023-10-29 MED ORDER — OXYCODONE-ACETAMINOPHEN 5-325 MG PO TABS
1.0000 | ORAL_TABLET | Freq: Once | ORAL | Status: AC
  Administered 2023-10-29: 1 via ORAL
  Filled 2023-10-29: qty 1

## 2023-10-29 MED ORDER — LOSARTAN POTASSIUM 50 MG PO TABS
50.0000 mg | ORAL_TABLET | Freq: Every day | ORAL | Status: DC
  Administered 2023-10-29: 50 mg via ORAL
  Filled 2023-10-29: qty 1

## 2023-10-29 MED ORDER — METOCLOPRAMIDE HCL 10 MG PO TABS
10.0000 mg | ORAL_TABLET | Freq: Three times a day (TID) | ORAL | Status: DC
  Administered 2023-10-29: 10 mg via ORAL
  Filled 2023-10-29: qty 1

## 2023-10-29 NOTE — ED Notes (Signed)
Assisted to bathroom to void via wheel chair. No complaints. No signs of distress.

## 2023-10-29 NOTE — Discharge Instructions (Addendum)
It was a pleasure taking care of you today.  As discussed, you were cleared by psychiatry here in the ER.  I am sending you home with pain medication.  Take as needed for severe pain.  Pain medication can cause drowsiness so do not drive or operate machinery while on the medication.  I have included the number of the neurosurgeon.  Please call to schedule an appointment for further evaluation of your back pain.  Return to the ER for any worsening symptoms.

## 2023-10-29 NOTE — BH Assessment (Addendum)
Comprehensive Clinical Assessment (CCA) Note  10/29/2023 Judith [name] [id number] Disposition: Clinician discussed patient care with Judith Guadeloupe, NP.  He recommended patient be observed in the ED and seen by psychiatry later on 11/05.  Clinician informed [name] about disposition recommendation via secure messaging.  Patient was quiet for the most part.  She is soft spoken and had fair eye contact.  She ws not oriented to date.  She is not responding to internal stimuli.  She admits that if she did not have this pain, she would not be trying to starve herself.  Patient reports sleeping about 6 hours a night.    Pt has no outpatient care.     Chief Complaint:  Chief Complaint  Patient presents with   Leg Pain   Visit Diagnosis: MDD single episode, severe    CCA Screening, Triage and Referral (STR)  Patient Reported Information How did you hear about [place]? Family/Friend  What Is the Reason for Your Visit/Call Today? Pt was brought to [facility] by her daughter.  She lives at [facility] in [place].  Pt has severe pain in both legs.  She has stopped walking for the last week.  Daughter is present during assessment and provides some information.  Patient has been staying in bed and not getting up.  Patient has been isolating hersef from other people at [place].  She has been refusing to eat any food for the lst 8 days.  Patient says that she has been in constant pain and does not feel like "that is the only way I can get rid of it (to not eat).  Patient says that if she were not in this pain she would not feel this way.  Patient denies any HI or A/V hallucinations.  Patient has seen doctors who have given muscle relaxors.  Daughter said that patient has appointments (including one for today) with her doctor(s) but patient has been refusing to go to appointments over the last few days.  Pt denies any use of ETOH or other substances.  Pt has no previous hx of SI.  Patient has no  access to guns.  Patient has been able to get up and go to the bathroom, no incontinence products are needed.  Pt has not been tening to hygiene and has been sleeping in her regular clothes.  How Long Has This Been Causing You Problems? 1 wk - 1 month  What Do You Feel Would Help You the Most Today? Treatment for Depression or other mood problem   Have You Recently Had Any Thoughts About Hurting Yourself? Yes  Are You Planning to Commit Suicide/Harm Yourself At This time? No   Flowsheet Row ED from 10/28/2023 in [facility] Emergency Department at [facility] ED from 10/22/2023 in [facility] Emergency Department at [facility] ED from 08/19/2023 in [facility] Emergency Department at [facility]  C-SSRS RISK CATEGORY No Risk No Risk No Risk       Have you Recently Had Thoughts About Hurting Someone Judith [name]? No  Are You Planning to Harm Someone at This Time? No  Explanation: Pt denies SI or HI.   Have You Used Any Alcohol or Drugs in the Past 24 Hours? No  What Did You Use and How Much? None   Do You Currently Have a Therapist/Psychiatrist? No  Name of Therapist/Psychiatrist: Name of Therapist/Psychiatrist: No outpatient care.   Have You Been Recently Discharged From Any Office Practice or Programs? No  Explanation of Discharge From Practice/Program: No     CCA Screening Triage Referral Assessment Type of Contact: Tele-Assessment  Telemedicine Service Delivery:   Is this Initial or Reassessment? Is this Initial or Reassessment?: Initial Assessment  Date Telepsych consult ordered in [facility]:  Date Telepsych consult ordered in [facility]: 10/29/23  Time Telepsych consult ordered in [facility]:  Time Telepsych consult ordered in [facility]: 0054  Location of Assessment: [facility] ED  Provider Location: [facility] Assessment Services   Collateral Involvement: daughter, Judith [name] [phone]   Does Patient Have a Court Appointed Legal Guardian? No  Legal Guardian Contact  Information: Pt does not have a legal guardian  Copy of Legal Guardianship Form: -- (Pt does not have a legal guardian)  Legal Guardian Notified of Arrival: -- (Pt does not have a legal guardian)  Legal Guardian Notified of Pending Discharge: -- (Pt does not have a legal guardian)  If Minor and Not Living with Parent(s), Who has Custody? Pt is an adult  Is CPS involved or ever been involved? Never  Is APS involved or ever been involved? Never   Patient Determined To Be At Risk for Harm To Self or Others Based on Review of Patient Reported Information or Presenting Complaint? Yes, for Self-Harm  Method: -- (Pt intention is unclear.)  Availability of Means: -- (Pt talks about not eating.)  Intent: Vague intent or NA  Notification Required: -- (No HI.)  Additional Information for Danger to Others Potential: -- (No danger to others potential)  Additional Comments for Danger to Others Potential: No danger to others potential  Are There Guns or Other Weapons in Your Home? No  Types of Guns/Weapons: No guns at the nursing home.  Are These Weapons Safely Secured?                            No  Who Could Verify You Are Able To Have These Secured: Pt's daugher  Do You Have any Outstanding Charges, Pending Court Dates, Parole/Probation? None reported.  Contacted To Inform of Risk of Harm To Self or Others: Other: Comment (No need)    Does Patient Present under Involuntary Commitment? No    [place] of Residence: [name]   Patient Currently Receiving the Following Services: Not Receiving Services   Determination of Need: Urgent (48 hours)   Options For Referral: Other: Comment (Observe overnight and psychiatry to see on 11/05.)     CCA Biopsychosocial Patient Reported Schizophrenia/Schizoaffective Diagnosis in Past: No   Strengths: Pt likes to do jigsaw puzzles   Mental Health Symptoms Depression:   Change in energy/activity; Hopelessness; Fatigue   Duration  of Depressive symptoms:  Duration of Depressive Symptoms: Less than two weeks   Mania:   None   Anxiety:    Tension; Worrying   Psychosis:   None   Duration of Psychotic symptoms:    Trauma:   None   Obsessions:   Disrupts routine/functioning   Compulsions:   Disrupts with routine/functioning   Inattention:   None   Hyperactivity/Impulsivity:   N/A   Oppositional/Defiant Behaviors:   N/A   Emotional Irregularity:   None   Other Mood/Personality Symptoms:   Pt is depressed.    Mental Status Exam Appearance and self-care  Stature:   Average   Weight:   Thin   Clothing:   Casual   Grooming:   Neglected (Per daughter pt has not showered in a week.)   Cosmetic use:  None   Posture/gait:   Other (Comment) (Pt has not been walking for the last week.  Pt is laying prone on a bed.)   Motor activity:   Slowed   Sensorium  Attention:   Normal   Concentration:   Normal   Orientation:   Situation; Place; Person; Object   Recall/memory:   Defective in Short-term   Affect and Mood  Affect:   Depressed; Flat   Mood:   Depressed   Relating  Eye contact:   Fleeting   Facial expression:   Depressed   Attitude toward examiner:   Cooperative; Guarded   Thought and Language  Speech flow:  Soft   Thought content:   Appropriate to Mood and Circumstances   Preoccupation:   Somatic   Hallucinations:   None   Organization:   Coherent   [employer] of Knowledge:   Average   Intelligence:   Average   Abstraction:   Normal   Judgement:   Poor   Reality Testing:   Distorted   Insight:   Lacking   Decision Making:   Only simple   Social Functioning  Social Maturity:   Isolates   Social Judgement:   Victimized   Stress  Stressors:   Illness   Coping Ability:   Overwhelmed; Exhausted   Skill Deficits:   Self-control   Supports:   Family; Friends/Service system      Religion: Religion/Spirituality Are You A Religious Person?: Yes What is Your Religious Affiliation?: Methodist How Might This Affect Treatment?: NO affect  Leisure/Recreation: Leisure / Recreation Do You Have Hobbies?: Yes Leisure and Hobbies: Reading and puzzles  Exercise/Diet: Exercise/Diet Do You Exercise?: No Have You Gained or Lost A Significant Amount of Weight in the Past Six Months?: Yes-Lost Number of Pounds Lost?: 10 Do You Follow a Special Diet?: No Do You Have Any Trouble Sleeping?: Yes Explanation of Sleeping Difficulties: 6 hours   CCA Employment/Education Employment/Work Situation: Employment / Work Psychologist, occupational Employment Situation: Retired Passenger transport manager has Been Impacted by Current Illness: No Has Patient ever Been in Equities trader?: No  Education: Education Is Patient Currently Attending School?: No Last Grade Completed: 12 Did You Product manager?: No Did You Have An Individualized Education Program (IIEP): No Did You Have Any Difficulty At [employer]?: No Patient's Education Has Been Impacted by Current Illness: No   CCA Family/Childhood History Family and Relationship History: Family history Marital status: Widowed Widowed, when?: 2016 Does patient have children?: Yes How many children?: 3 How is patient's relationship with their children?: Good relationship  Childhood History:  Childhood History By whom was/is the patient raised?: Both parents Did patient suffer any verbal/emotional/physical/sexual abuse as a child?: No Did patient suffer from severe childhood neglect?: No Has patient ever been sexually abused/assaulted/raped as an adolescent or adult?: No Was the patient ever a victim of a crime or a disaster?: No Witnessed domestic violence?: No Has patient been affected by domestic violence as an adult?: No       CCA Substance Use Alcohol/Drug Use: Alcohol / Drug Use Pain Medications: See MAR Prescriptions: See MAR Over the  Counter: See MAR History of alcohol / drug use?: No history of alcohol / drug abuse                         ASAM's:  Six Dimensions of Multidimensional Assessment  Dimension 1:  Acute Intoxication and/or Withdrawal Potential:      Dimension  2:  Biomedical Conditions and Complications:      Dimension 3:  Emotional, Behavioral, or Cognitive Conditions and Complications:     Dimension 4:  Readiness to Change:     Dimension 5:  Relapse, Continued use, or Continued Problem Potential:     Dimension 6:  Recovery/Living Environment:     ASAM Severity Score:    ASAM Recommended Level of Treatment:     Substance use Disorder (SUD)    Recommendations for Services/Supports/Treatments:    Discharge Disposition:    DSM5 Diagnoses: Patient Active Problem List   Diagnosis Date Noted   Pulmonary nodule 06/17/2023   Abdominal pain 06/13/2023   Pacemaker battery depletion 04/25/2023   Fall 01/11/2023   UTI (urinary tract infection) 12/28/2022   Postnasal drip 11/01/2022   Urinary incontinence 08/22/2022   Encounter to establish care with new doctor 08/07/2022   Choledocholithiasis 11/28/2021   Sleep apnea    Hyperlipidemia    Type 2 diabetes mellitus with hyperglycemia (HCC)    Polycythemia    Unspecified inflammatory spondylopathy, cervical region (HCC) 11/15/2020   Multilevel degenerative disc disease 04/03/2018   Acute blood loss anemia 01/17/2018   Gallstones 01/17/2018   Unintentional weight loss 01/17/2018   Dilated bile duct    Elevated liver function tests    Gastritis and gastroduodenitis    S/P partial gastrectomy    Peripheral neuropathy    Benign essential HTN    Gastroparesis    Duodenal ulcer disease    Controlled type 2 diabetes mellitus with diabetic neuropathy, with long-term current use of insulin (HCC)    Depression    Biliary gastritis    Seasonal allergies    Presence of cardiac pacemaker    Gout    Cervical spine arthritis 04/05/2016    Long-term insulin use in type 2 diabetes (HCC) 04/05/2016     Referrals to Alternative Service(s): Referred to Alternative Service(s):   Place:   Date:   Time:    Referred to Alternative Service(s):   Place:   Date:   Time:    Referred to Alternative Service(s):   Place:   Date:   Time:    Referred to Alternative Service(s):   Place:   Date:   Time:     Wandra Mannan
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
## 2023-10-29 NOTE — ED Notes (Signed)
Calm. Cooperative. Sitting up in chair eating lunch. Daughter in to visit

## 2023-10-29 NOTE — ED Notes (Signed)
Wheeled patient to the bathroom patient is now back in bed with rails up

## 2023-10-29 NOTE — ED Notes (Signed)
Pt dressed in burgundy scrubs and wanded by security.

## 2023-10-29 NOTE — ED Notes (Addendum)
Pt transported to 47 for TTS

## 2023-10-29 NOTE — ED Provider Notes (Signed)
Patient cleared by psychiatry. Patient discharged with short course pain medication and neurosurgery referral. Low suspicion for cauda equina or central cord compression. No infectious symptoms to suggest infectious etiology. Patient stable for discharge. Strict ED precautions discussed with patient. Patient states understanding and agrees to plan. Patient discharged home in no acute distress and stable vitals.        Mannie Stabile, PA-C 10/29/23 1440    Gilda Crease, MD 10/30/23 [phone]

## 2023-11-05 NOTE — Telephone Encounter (Signed)
Received returned call from Judith [name]. She states that patient did go to the ED for psych eval. She was observed overnight and is back at facility.   She is asking if Dr. Melissa Noon would prescribe her a medication to help with depression.   Advised that patient would need an office visit to discuss this further. Scheduled with PCP for next available on 11/12/23.  Veronda Prude, RN

## 2023-11-11 ENCOUNTER — Encounter: Payer: Self-pay | Admitting: Student

## 2023-11-12 ENCOUNTER — Ambulatory Visit (INDEPENDENT_AMBULATORY_CARE_PROVIDER_SITE_OTHER): Payer: Medicare Other | Admitting: Student

## 2023-11-12 ENCOUNTER — Encounter: Payer: Self-pay | Admitting: Student

## 2023-11-12 VITALS — BP 124/75 | HR 78 | Ht 63.0 in | Wt 101.0 lb

## 2023-11-12 DIAGNOSIS — M5135 Other intervertebral disc degeneration, thoracolumbar region: Secondary | ICD-10-CM

## 2023-11-12 DIAGNOSIS — R4589 Other symptoms and signs involving emotional state: Secondary | ICD-10-CM

## 2023-11-12 DIAGNOSIS — R634 Abnormal weight loss: Secondary | ICD-10-CM | POA: Diagnosis not present

## 2023-11-12 MED ORDER — MIRTAZAPINE 7.5 MG PO TABS: 7.5000 mg | ORAL_TABLET | Freq: Every day | ORAL | 3 refills | Status: DC

## 2023-11-12 NOTE — Patient Instructions (Addendum)
It was great seeing you today.  As we discussed, -I sent in a medication that we will help with your appetite and mood changes.  It is called mirtazapine.  Please take this every day. -Continue taking gabapentin as prescribed. -We are checking some blood work today. -I sent a referral to the spine surgeon to discuss possible injections for your pain.   If you have any questions or concerns, please feel free to call the clinic.   Have a wonderful day,  Dr. Darral Dash [facility] [phone]

## 2023-11-12 NOTE — Progress Notes (Signed)
    SUBJECTIVE:   CHIEF COMPLAINT / HPI:   Judith [name] is an 82 year-old female here for low back pain with radicular symptoms into both legs- described as pins and needles sensation.  The patient's daughter Judith [name]) relayed concerns to me for her mother having depression via MyChart: Per [name], "showing signs of depression. She sits in her room all day with lights out and no tv. She has stated several times that she wants to die. She has lost so much weight as a result of not eating in an attempt to end her life."  She was seen at the ED on 10/28/2023 with bilateral leg pain, evaluated by psychiatry for suicidal ideation secondary to her worsening pain and was cleared for discharge.  Today, Judith [name] tells me that she is not wanting to eat secondary to her pain.  She says that she feels safe at her living facility and denies any thoughts of self-harm.  She does tell me that she knows she is not eating enough and just does not have an appetite. Denies fevers, chills, bowel or bladder incontinence.  She says that sitting down makes her back pain worse, she denies any history of falls. Taking the gabapentin 400 mg at night, and 100 mg in the morning and at lunch.  She says that hydrocodone and gabapentin do not help.  She is taking 600 mg of gabapentin daily.  PERTINENT  PMH / PSH: Reviewed  OBJECTIVE:   BP 124/75   Pulse 78   Ht 5\' 3"  (1.6 m)   Wt 101 lb (45.8 kg)   SpO2 93%   BMI 17.89 kg/m   General: Elderly, frail, ambulates with walker HEENT: Pupils PERRLA. MMM Cardiac: RRR Respiratory: Normal WOB on room air. Psych: Mood seems down, tends to gaze at the ground.   ASSESSMENT/PLAN:   Unintentional weight loss Weight has dropped significantly over the past few months.  She has lost 12 pounds since 09/27/2023. I am very worried about her frailty and persistent weight loss. Question if this is secondary to pain versus underlying psychiatric condition. Patient reports she feels  safe at the facility and denies any neglect/harm. Will collect lab work today with CBC, TSH, CMP, LDH to rule out any organic causes of her weight loss.  Depressed mood Patient reports that her mood changes are mostly secondary to her lower extremity pain.   Denies any active SI today. Plan to start mirtazapine 7.5 mg nightly to assist in mood as well as decreased appetite. Need close follow-up within the next week, to follow-up on weight and mood. I will call her daughter Judith [name] and provide updates on her visit today.    Darral Dash, DO [facility]

## 2023-11-12 NOTE — Assessment & Plan Note (Addendum)
Patient reports that her mood changes are mostly secondary to her lower extremity pain.   Denies any active SI today. Plan to start mirtazapine 7.5 mg nightly to assist in mood as well as decreased appetite. Need close follow-up within the next week, to follow-up on weight and mood. I will call her daughter [name] and provide updates on her visit today.

## 2023-11-12 NOTE — Assessment & Plan Note (Signed)
Weight has dropped significantly over the past few months.  She has lost 12 pounds since 09/27/2023. I am very worried about her frailty and persistent weight loss. Question if this is secondary to pain versus underlying psychiatric condition. Patient reports she feels safe at the facility and denies any neglect/harm. Will collect lab work today with CBC, TSH, CMP, LDH to rule out any organic causes of her weight loss.

## 2023-11-13 ENCOUNTER — Telehealth: Payer: Self-pay

## 2023-11-13 LAB — CBC
Hematocrit: 38.5 % (ref 34.0–46.6)
Hemoglobin: 13.4 g/dL (ref 11.1–15.9)
MCH: 33.9 pg — ABNORMAL HIGH (ref 26.6–33.0)
MCHC: 34.8 g/dL (ref 31.5–35.7)
MCV: 98 fL — ABNORMAL HIGH (ref 79–97)
Platelets: 305 10*3/uL (ref 150–450)
RBC: 3.95 x10E6/uL (ref 3.77–5.28)
RDW: 12.7 % (ref 11.7–15.4)
WBC: 12.5 10*3/uL — ABNORMAL HIGH (ref 3.4–10.8)

## 2023-11-13 LAB — COMPREHENSIVE METABOLIC PANEL
ALT: 12 [IU]/L (ref 0–32)
AST: 26 [IU]/L (ref 0–40)
Albumin: 4.2 g/dL (ref 3.7–4.7)
Alkaline Phosphatase: 58 [IU]/L (ref 44–121)
BUN/Creatinine Ratio: 25 (ref 12–28)
BUN: 21 mg/dL (ref 8–27)
Bilirubin Total: 0.6 mg/dL (ref 0.0–1.2)
CO2: 23 mmol/L (ref 20–29)
Calcium: 9.6 mg/dL (ref 8.7–10.3)
Chloride: 88 mmol/L — ABNORMAL LOW (ref 96–106)
Creatinine, Ser: 0.83 mg/dL (ref 0.57–1.00)
Globulin, Total: 2.4 g/dL (ref 1.5–4.5)
Glucose: 160 mg/dL — ABNORMAL HIGH (ref 70–99)
Potassium: 4.9 mmol/L (ref 3.5–5.2)
Sodium: 125 mmol/L — ABNORMAL LOW (ref 134–144)
Total Protein: 6.6 g/dL (ref 6.0–8.5)
eGFR: 70 mL/min/{1.73_m2} (ref >59)

## 2023-11-13 LAB — TSH RFX ON ABNORMAL TO FREE T4: TSH: 1.43 u[IU]/mL (ref 0.450–4.500)

## 2023-11-13 LAB — LACTATE DEHYDROGENASE: LDH: 242 [IU]/L — ABNORMAL HIGH (ref 119–226)

## 2023-11-13 NOTE — Telephone Encounter (Signed)
[facility] calls nurse line requesting a refill on patients Hydrocodone.   She reports the patient was given #8 on 11/4 for leg pain and ran out this week.   Advised it appears an ED provider wrote the original script.   Will forward to PCP for advisement.

## 2023-11-18 ENCOUNTER — Encounter: Payer: Self-pay | Admitting: Student

## 2023-11-18 ENCOUNTER — Ambulatory Visit (INDEPENDENT_AMBULATORY_CARE_PROVIDER_SITE_OTHER): Payer: Medicare Other | Admitting: Student

## 2023-11-18 VITALS — BP 114/75 | HR 99 | Ht 63.0 in | Wt 103.6 lb

## 2023-11-18 DIAGNOSIS — M79605 Pain in left leg: Secondary | ICD-10-CM | POA: Diagnosis not present

## 2023-11-18 DIAGNOSIS — M79604 Pain in right leg: Secondary | ICD-10-CM

## 2023-11-18 DIAGNOSIS — R4589 Other symptoms and signs involving emotional state: Secondary | ICD-10-CM

## 2023-11-18 MED ORDER — DICLOFENAC SODIUM 1 % EX GEL: 2.0000 g | Freq: Four times a day (QID) | CUTANEOUS | 1 refills | Status: DC | PRN

## 2023-11-18 MED ORDER — ACETAMINOPHEN 500 MG PO TABS: 500.0000 mg | ORAL_TABLET | Freq: Four times a day (QID) | ORAL | Status: DC | PRN

## 2023-11-18 NOTE — Progress Notes (Signed)
    SUBJECTIVE:   CHIEF COMPLAINT / HPI:   ED visit 10/29/23:  Originally, the patient was brought to [facility] on 10/29/2023 for severe pain in the legs which has prevented her from walking over the last week.  Additionally, had concern for suicidal ideation.  She was found to have parasuicidal statements attributed to increased lower extremity pain. Her pain was treated and then she was sent back to [facility] assisted living.   Per last assessment with PCP on 11/12/23 She was started on mirtazapine 7.5 mg nightly to assist in mood and decreased appetite.  At that visit, she had lost 12 pounds since 10/24.  Per family from last visit,  Patient was sitting in her room all day with lights out and no TV.  She had stated several times that she wanted to die and had lost a significant amount of weight.  She was eating hard candy and drinking orange juice almost daily.  Complained of leg pain during this time as well.  Follow up for Mood & Pain:  Eating and drinking, has pain in the legs -- about 80% and back pain.  Patient reports that overall she feels somewhat better and she endorses good p.o. intake. She reports no problems with her roommate at [facility]. After discussion with daughter via telephone, daughter notes that she is concerned about depressed mood not as much the patient's pain. Daughter reports that they will have epidural injection scheduled with spine surgery in the next few weeks. Stopped hydrocodone  She reports that she has a medication list from [place], does not have in the room Daughter does report that the patient had a fall at [facility] but that she had no injuries from it.  The patient denies recent fall or head trauma.  PERTINENT  PMH / PSH:  HTN Pulmonary nodule  Sleep apnea  Gastroparesis  DM2 Neuropathy  Depression  SI history  Weight loss   OBJECTIVE:   BP 114/75   Pulse 99   Ht 5\' 3"  (1.6 m)   Wt 103 lb 9.6 oz (47 kg)   SpO2 97%   BMI 18.35 kg/m    General: Alert in no apparent distress Heart: Regular rate and rhythm with no murmurs appreciated Lungs: CTA bilaterally, no wheezing Abdomen: no abdominal pain Skin: Warm and dry Extremities: No lower extremity edema, dry skin, no abrasions, ambulates with walker  Psych: Depressed mood   ASSESSMENT/PLAN:   Assessment & Plan Pain in both lower extremities Patient has had hydrocodone stopped, as it was not helpful for her.  Started back on Tylenol and Voltaren gel.  Patient reports that she has side effects from NSAIDs.  She also will schedule follow-up with spinal surgery. Depressed mood Continue with the mirtazapine that Dr. Melissa Noon has started her on.  She reports that she has taken this every day as instructed.  She does note that she has had improved p.o. intake, will repeat BMP today.  P.o. intake.  Because the patient lives at [place] and has had a depressed mood at her age, called the daughter for assistance.  Requested that the daughter come in with the patient at her next visit in 1 to 2 weeks, scheduled while the patient was in office, and to bring in medication list from [place] as I do not have it here today.   Seemingly hypovolemic hyponatremia- repeat BMP  Alfredo Martinez, MD [facility]

## 2023-11-18 NOTE — Patient Instructions (Addendum)
It was great to see you today! Thank you for choosing [facility] for your primary care.  Today we addressed: Use voltaren gel four times a day Use tylenol 500 mg every 6 hours as needed We will repeat the bmp Please bring all medication list and daughter to next visit.  If you haven't already, sign up for My Chart to have easy access to your labs results, and communication with your primary care physician. I recommend that you always bring your medications to each appointment as this makes it easy to ensure you are on the correct medications and helps [place] not miss refills when you need them. Call the clinic at [phone] if your symptoms worsen or you have any concerns.  Please arrive 15 minutes before your appointment to ensure smooth check in process.  We appreciate your efforts in making this happen.  Thank you for allowing me to participate in your care, Alfredo Martinez, MD 11/18/2023, 3:14 PM PGY-3, [facility]

## 2023-11-18 NOTE — Assessment & Plan Note (Signed)
Continue with the mirtazapine that Dr. Melissa Noon has started her on.  She reports that she has taken this every day as instructed.  She does note that she has had improved p.o. intake, will repeat BMP today.  P.o. intake.  Because the patient lives at [place] and has had a depressed mood at her age, called the daughter for assistance.  Requested that the daughter come in with the patient at her next visit in 1 to 2 weeks, scheduled while the patient was in office, and to bring in medication list from [place] as I do not have it here today.

## 2023-11-19 ENCOUNTER — Telehealth: Payer: Self-pay

## 2023-11-19 ENCOUNTER — Other Ambulatory Visit: Payer: Self-pay | Admitting: Student

## 2023-11-19 DIAGNOSIS — E875 Hyperkalemia: Secondary | ICD-10-CM

## 2023-11-19 LAB — BASIC METABOLIC PANEL
BUN/Creatinine Ratio: 16 (ref 12–28)
BUN: 17 mg/dL (ref 8–27)
CO2: 23 mmol/L (ref 20–29)
Calcium: 9 mg/dL (ref 8.7–10.3)
Chloride: 91 mmol/L — ABNORMAL LOW (ref 96–106)
Creatinine, Ser: 1.06 mg/dL — ABNORMAL HIGH (ref 0.57–1.00)
Glucose: 133 mg/dL — ABNORMAL HIGH (ref 70–99)
Potassium: 5.6 mmol/L — ABNORMAL HIGH (ref 3.5–5.2)
Sodium: 128 mmol/L — ABNORMAL LOW (ref 134–144)
eGFR: 52 mL/min/{1.73_m2} — ABNORMAL LOW (ref >59)

## 2023-11-19 MED ORDER — LOKELMA 10 G PO PACK: 10.0000 g | PACK | Freq: Two times a day (BID) | ORAL | 0 refills | Status: DC

## 2023-11-19 MED ORDER — LOKELMA 10 G PO PACK: 10.0000 g | PACK | Freq: Two times a day (BID) | ORAL | 0 refills | Status: AC

## 2023-11-19 NOTE — Telephone Encounter (Signed)
Patients daughter returns call to nurse line.   Judith [name] advised on elevated Potassium and the need for [facility].   Judith [name] advised she will need to take one dose now and one dose later this evening.   Judith [name] reports this medication will need to go to [facility] on [address] for her to pick up vs being sent to [facility].   Judith [name] reports she will take her the medication now and do another dose later.   She will bring her in tomorrow for repeat BMP. Advised she will need to call before they come to schedule a lab only visit.   Advised instruction are on mychart as well.   I have cancelled prescription at [facility] and [facility].

## 2023-11-19 NOTE — Telephone Encounter (Signed)
[place] calls nurse line requesting an update on paperwork.   She reports they supply patient with [employer].  Paperwork is in PCP box for review.

## 2023-11-20 ENCOUNTER — Other Ambulatory Visit: Payer: Medicare Other

## 2023-11-20 DIAGNOSIS — E875 Hyperkalemia: Secondary | ICD-10-CM

## 2023-11-21 LAB — BASIC METABOLIC PANEL
BUN/Creatinine Ratio: 15 (ref 12–28)
BUN: 13 mg/dL (ref 8–27)
CO2: 23 mmol/L (ref 20–29)
Calcium: 8.7 mg/dL (ref 8.7–10.3)
Chloride: 92 mmol/L — ABNORMAL LOW (ref 96–106)
Creatinine, Ser: 0.84 mg/dL (ref 0.57–1.00)
Glucose: 178 mg/dL — ABNORMAL HIGH (ref 70–99)
Potassium: 4.9 mmol/L (ref 3.5–5.2)
Sodium: 127 mmol/L — ABNORMAL LOW (ref 134–144)
eGFR: 69 mL/min/{1.73_m2} (ref >59)

## 2023-11-24 ENCOUNTER — Encounter: Payer: Self-pay | Admitting: Student

## 2023-11-25 NOTE — Telephone Encounter (Signed)
Spoke with [employer].   She reports she has noticed bilateral LE swelling over the past week.   She reports all her mother does is sit in a chair all day and contributes symptoms to this.   She denies any shortness of breath or SOB with exertion. She denies any weeping from her legs. She denies any chest pains.   Advised she has an apt coming up with PCP on 12/9. Advised to make a sooner apt if symptoms worsen or she notices a change in her breathing.   She can try elevating her legs through out the day.  Precautions discussed.

## 2023-11-25 NOTE — Progress Notes (Signed)
Remote pacemaker transmission.   

## 2023-11-26 ENCOUNTER — Encounter: Payer: Self-pay | Admitting: Student

## 2023-12-02 ENCOUNTER — Encounter: Payer: Self-pay | Admitting: Student

## 2023-12-02 ENCOUNTER — Ambulatory Visit (INDEPENDENT_AMBULATORY_CARE_PROVIDER_SITE_OTHER): Payer: Medicare Other | Admitting: Student

## 2023-12-02 VITALS — BP 139/55 | HR 83 | Ht 63.0 in | Wt 104.8 lb

## 2023-12-02 DIAGNOSIS — Z794 Long term (current) use of insulin: Secondary | ICD-10-CM | POA: Diagnosis not present

## 2023-12-02 DIAGNOSIS — R634 Abnormal weight loss: Secondary | ICD-10-CM

## 2023-12-02 DIAGNOSIS — R54 Age-related physical debility: Secondary | ICD-10-CM

## 2023-12-02 DIAGNOSIS — E1140 Type 2 diabetes mellitus with diabetic neuropathy, unspecified: Secondary | ICD-10-CM

## 2023-12-02 DIAGNOSIS — M539 Dorsopathy, unspecified: Secondary | ICD-10-CM

## 2023-12-02 DIAGNOSIS — Z7189 Other specified counseling: Secondary | ICD-10-CM

## 2023-12-02 LAB — POCT GLYCOSYLATED HEMOGLOBIN (HGB A1C): HbA1c, POC (controlled diabetic range): 6 % (ref 0.0–7.0)

## 2023-12-02 NOTE — Patient Instructions (Addendum)
It was great seeing you today.  As we discussed, - STOP taking mealtime insulin (insulin lispro). - Continue insulin glargine (Lantus) 18 units daily. - Check glucose once daily, ideally fasting at 0800 AM. - START taking PO Cranberry tablets once daily and PO probiotic once daily, at breakfast.  - Please call MD if CBG > 350 with vomiting, nausea, loss of appetite.   If you have any questions or concerns, please feel free to call the clinic.   Have a wonderful day,  Dr. Darral Dash [facility] [phone]

## 2023-12-02 NOTE — Progress Notes (Signed)
 SUBJECTIVE:   CHIEF COMPLAINT / HPI:   Judith [name] is an 82 year old female here with her daughter Judith [name] for follow-up of weight loss, T2DM and depression.  Unfortunately, she has had continued decline with frailty over the past month or so.  She is now having trouble dressing herself or bathing herself.  She is still able to feed herself.  Depressed mood Mood wise, she has had much improvement since starting mirtazapine .  She has been going to the dining room and eating, and before starting the medication she was just staying in her room most of the time.  T2DM She is currently using mealtime insulin .  Judith [name] has concerns that they have been giving this at inappropriate times.  Sometimes though given at 4 PM before she has even eaten much at all.  24-hour food recall: Breakfast: Oatmeal Lunch: Ham sandwich Dinner: Veggies and protein  Daughter Judith [name] said her last CGM sensor did not start/work, and had difficulty staying on her skin. They are both wondering if we need to make changes to her insulin  regimen, especially in light of decreased food intake, although this has improved since starting mirtazapine .  A1c today 6.0%.  Current regimen: Sliding scale insulin  with mealtime (although sounds like this is not been administered appropriately) Insulin  glargine (Lantus  18 units into the skin daily   Weight loss/frailty Now with an essential tremor.  I am happy to see that she is starting to have improvement in her weight.  She was as low as 101 pounds on 10/2023, and today she is 104 pounds. Breakfast: Oatmeal Lunch: Ham sandwich Dinner: Protein, veggies  PERTINENT  PMH / PSH: T2DM Frailty Multilevel degenerative disc disease Peripheral neuropathy Unintentional weight loss Depression Pacemaker  OBJECTIVE:   BP (!) 139/55   Pulse 83   Ht 5' 3 (1.6 m)   Wt 104 lb 12.8 oz (47.5 kg)   SpO2 97%   BMI 18.56 kg/m   General: Well-dressed, thin elderly female in no  distress, ambulates with rolling walker Cardiac: Regular rate and rhythm Respiratory: Speaks in full sentences with normal work of breathing on room air.  Lungs are clear to auscultation. Skin: Pallor, unchanged from prior exams Extremities: Warm and well-perfused with no edema Skin: No visible rashes or bruises   ASSESSMENT/PLAN:   Controlled type 2 diabetes mellitus with diabetic neuropathy, with long-term current use of insulin  (HCC) Shared decision making today to discontinue mealtime insulin  coverage given significantly reduced appetite, and A1c at 6.0%. Will continue basal insulin  Lantus  (insulin  glargine) 18  units daily in the morning Discussed the dangers of hypoglycemia, and that the preference would be to avoid overuse of insulin  causing accidental hypoglycemia. Discontinue CGM monitoring.  Unintentional weight loss A bit improved today, she has gained 3 pounds since 11/12/2023, feels like her appetite is improving with mirtazapine . I think this was likely secondary to pain and underlying depression. She received her pain injections for disc disease/spinal stenosis, I am hopeful she will have continued improvement. Continue mirtazapine  7.5 mg daily. Will continue to monitor  Multilevel degenerative disc disease Mostly in the lumbar region with stenosis as well, causing significant pain. She established care with spine surgery and had injections. She and daughter both interested in starting a low-dose narcotic of oxycodone  2.5 mg every 6 hours due to severity of her pain which is severely impacting her quality of life.  Goals of care, counseling/discussion [name] is suffering from pain and depression, likely worsened by the  aging process. She and her daughter are interested in palliative care.  It has been difficult to observe her decline with frailty, but we have reached a plateau and she seems to be holding steady. I am pleased to see her in better spirits today. I introduced  palliative care as specialized medical care that focuses on providing relief from pain and other symptoms of a serious illness, and differentiated hospice versus palliative care. She is mainly needing assistance in discussing goals of care (DNR/DNI vs Full code, thoughts regarding aggressive interventions should she clinically decline), reducing medication burden (which we have decreased her insulin  regimen today as long as CGM monitoring), and symptomatic pain relief from her severe degenerative disc disease and spinal stenosis. I started oxycodone  2.5 mg every 6 hours today.  I am hopeful this will help reduce her pain in addition to the injection she had with spine surgery.     [name], DO [facility]

## 2023-12-03 ENCOUNTER — Telehealth: Payer: Self-pay | Admitting: Student

## 2023-12-03 ENCOUNTER — Telehealth: Payer: Self-pay

## 2023-12-03 NOTE — Telephone Encounter (Signed)
[name] LVM on nurse line in regards to low dose pain medication.   She reports this was discussed yesterday during OV. She reports she would like to proceed with this.   She asks you send to [name] in [place].

## 2023-12-03 NOTE — Telephone Encounter (Signed)
Representative from [employer] concerning patients FL2 form being completed. She states that she has sent it three times over the last two months. Confirmed faxed number and it was correct. I informed her we had not received it.   She sent copy to my email that I have printed and placed in Dr. Weldon Picking box. She asked that this be done as soon as possible due to how long ago they originally requested.   Please advise.

## 2023-12-05 DIAGNOSIS — Z7189 Other specified counseling: Secondary | ICD-10-CM | POA: Insufficient documentation

## 2023-12-05 MED ORDER — OXYCODONE HCL 5 MG PO TABS: 2.5000 mg | ORAL_TABLET | Freq: Four times a day (QID) | ORAL | 0 refills | Status: DC

## 2023-12-05 NOTE — Assessment & Plan Note (Signed)
Shared decision making today to discontinue mealtime insulin coverage given significantly reduced appetite, and A1c at 6.0%. Will continue basal insulin Lantus (insulin glargine) 18  units daily in the morning Discussed the dangers of hypoglycemia, and that the preference would be to avoid overuse of insulin causing accidental hypoglycemia. Discontinue CGM monitoring.

## 2023-12-05 NOTE — Assessment & Plan Note (Signed)
Mostly in the lumbar region with stenosis as well, causing significant pain. She established care with spine surgery and had injections. She and daughter both interested in starting a low-dose narcotic of oxycodone 2.5 mg every 6 hours due to severity of her pain which is severely impacting her quality of life.

## 2023-12-05 NOTE — Assessment & Plan Note (Addendum)
Judith [name] is suffering from pain and depression, likely worsened by the aging process. She and her daughter are interested in palliative care.  It has been difficult to observe her decline with frailty, but we have reached a plateau and she seems to be holding steady. I am pleased to see her in better spirits today. I introduced palliative care as specialized medical care that focuses on providing relief from pain and other symptoms of a serious illness, and differentiated hospice versus palliative care. She is mainly needing assistance in discussing goals of care (DNR/DNI vs Full code, thoughts regarding aggressive interventions should she clinically decline), reducing medication burden (which we have decreased her insulin regimen today as long as CGM monitoring), and symptomatic pain relief from her severe degenerative disc disease and spinal stenosis. I started oxycodone 2.5 mg every 6 hours today.  I am hopeful this will help reduce her pain in addition to the injection she had with spine surgery.

## 2023-12-05 NOTE — Telephone Encounter (Signed)
VM left for [name].

## 2023-12-05 NOTE — Assessment & Plan Note (Signed)
A bit improved today, she has gained 3 pounds since 11/12/2023, feels like her appetite is improving with mirtazapine. I think this was likely secondary to pain and underlying depression. She received her pain injections for disc disease/spinal stenosis, I am hopeful she will have continued improvement. Continue mirtazapine 7.5 mg daily. Will continue to monitor

## 2023-12-10 ENCOUNTER — Other Ambulatory Visit: Payer: Self-pay | Admitting: Student

## 2023-12-11 ENCOUNTER — Other Ambulatory Visit: Payer: Self-pay | Admitting: Student

## 2023-12-11 MED ORDER — OXYCODONE HCL 5 MG PO TABS: 5.0000 mg | ORAL_TABLET | Freq: Three times a day (TID) | ORAL | 0 refills | Status: DC

## 2023-12-11 NOTE — Progress Notes (Signed)
Oxycodone increased from 2.5 mg every 6 hours to 5 mg every 8 hours.  #90 tablets for 30 days.  Sent to [facility] [place], [place] .

## 2023-12-20 ENCOUNTER — Telehealth: Payer: Self-pay

## 2023-12-20 NOTE — Telephone Encounter (Signed)
Patient's daughter, [name], calls nurse line regarding concerns with pain medication.   She feels that oxycodone 5 mg may be too strong, as patient is experiencing increased episodes of incontinence and increased drowsiness. She is asking if dosage can be decreased back down to 2.5 mg and be administered on PRN basis, rather than scheduled. She denies any difficulty breathing.   She also expressed concerns that patient may need to be increased on antidepressant due to continued episodes of socially withdrawing.  Patient has upcoming visit with palliative provider next week.   Will forward to Dr. Melissa Noon for further advisement.   Veronda Prude, RN

## 2023-12-26 ENCOUNTER — Telehealth: Payer: Self-pay

## 2023-12-26 NOTE — Telephone Encounter (Signed)
 Elenor assistant for [employer] [facility] calls nurse line requesting to speak with PCP.   She reports the NP she works for [employer]) visited patient on 12/31 and recommends a trial of Celebrex .   Advised will forward to PCP.   She reports this is just a suggestion and you do not have to call her back.

## 2023-12-27 MED ORDER — CELECOXIB 50 MG PO CAPS: 50.0000 mg | ORAL_CAPSULE | Freq: Two times a day (BID) | ORAL | 0 refills | Status: DC

## 2023-12-30 ENCOUNTER — Telehealth: Payer: Self-pay

## 2023-12-30 NOTE — Telephone Encounter (Signed)
 Received call from Deseree, nurse at [facility] regarding patient.   She reports that pharmacy is not filling Celebrex  as patient has a documented allergy to Celebrex  in their system. Deseree is unsure what type of reaction she has had to medication.   They need clarification from provider if patient should be prescribed medication or if alternative should be sent in.   Please advise.   Contact number for Deseree: [phone].  Chiquita JAYSON English, RN

## 2024-01-01 ENCOUNTER — Telehealth: Payer: Self-pay

## 2024-01-01 NOTE — Telephone Encounter (Signed)
 Received VM from patient's daughter, [name], regarding concerns for sore on patient's bottom. She reports that sore is located in the gluteal cleft.   She is asking if they should be applying anything to the area.   Attempted to return call to daughter, she did not answer.   LVM asking that she return call to office to discuss concern further.   Chiquita JAYSON English, RN

## 2024-01-02 ENCOUNTER — Encounter: Payer: Self-pay | Admitting: Student

## 2024-01-02 DIAGNOSIS — L98429 Non-pressure chronic ulcer of back with unspecified severity: Secondary | ICD-10-CM

## 2024-01-02 NOTE — Telephone Encounter (Signed)
 Daughter returns call to nurse line.   She reports noticing a sore on her bottom on Monday or Tuesday of this week.   She denies drainage or known fever.   Daughter is going to send a clinical cytogeneticist message with picture so that we can visualize the area and make a plan.   Judith JAYSON English, RN

## 2024-01-14 ENCOUNTER — Emergency Department (HOSPITAL_COMMUNITY): Payer: 59

## 2024-01-14 ENCOUNTER — Emergency Department (HOSPITAL_COMMUNITY)
Admission: EM | Admit: 2024-01-14 | Discharge: 2024-01-14 | Disposition: A | Discharge status: Skilled Nursing Facility | Payer: 59

## 2024-01-14 ENCOUNTER — Encounter (HOSPITAL_COMMUNITY): Payer: Self-pay

## 2024-01-14 ENCOUNTER — Other Ambulatory Visit: Payer: Self-pay

## 2024-01-14 DIAGNOSIS — Y92129 Unspecified place in nursing home as the place of occurrence of the external cause: Secondary | ICD-10-CM | POA: Diagnosis not present

## 2024-01-14 DIAGNOSIS — R42 Dizziness and giddiness: Secondary | ICD-10-CM | POA: Insufficient documentation

## 2024-01-14 DIAGNOSIS — W19XXXA Unspecified fall, initial encounter: Secondary | ICD-10-CM | POA: Insufficient documentation

## 2024-01-14 LAB — CBC
HCT: 35.5 % — ABNORMAL LOW (ref 36.0–46.0)
Hemoglobin: 11.7 g/dL — ABNORMAL LOW (ref 12.0–15.0)
MCH: 33.3 pg (ref 26.0–34.0)
MCHC: 33 g/dL (ref 30.0–36.0)
MCV: 101.1 fL — ABNORMAL HIGH (ref 80.0–100.0)
Platelets: 219 10*3/uL (ref 150–400)
RBC: 3.51 MIL/uL — ABNORMAL LOW (ref 3.87–5.11)
RDW: 14.6 % (ref 11.5–15.5)
WBC: 12.5 10*3/uL — ABNORMAL HIGH (ref 4.0–10.5)
nRBC: 0 % (ref 0.0–0.2)

## 2024-01-14 LAB — BASIC METABOLIC PANEL
Anion gap: 10 (ref 5–15)
BUN: 38 mg/dL — ABNORMAL HIGH (ref 8–23)
CO2: 23 mmol/L (ref 22–32)
Calcium: 8.5 mg/dL — ABNORMAL LOW (ref 8.9–10.3)
Chloride: 99 mmol/L (ref 98–111)
Creatinine, Ser: 0.91 mg/dL (ref 0.44–1.00)
GFR, Estimated: >60 mL/min (ref >60)
Glucose, Bld: 202 mg/dL — ABNORMAL HIGH (ref 70–99)
Potassium: 4.3 mmol/L (ref 3.5–5.1)
Sodium: 132 mmol/L — ABNORMAL LOW (ref 135–145)

## 2024-01-14 MED ORDER — MECLIZINE HCL 25 MG PO TABS
25.0000 mg | ORAL_TABLET | Freq: Once | ORAL | Status: AC
  Administered 2024-01-14: 25 mg via ORAL
  Filled 2024-01-14: qty 1

## 2024-01-14 MED ORDER — SODIUM CHLORIDE 0.9 % IV BOLUS
500.0000 mL | Freq: Once | INTRAVENOUS | Status: AC
  Administered 2024-01-14: 500 mL via INTRAVENOUS

## 2024-01-14 NOTE — Discharge Instructions (Signed)
Your workup today was reassuring.  Please follow-up with your doctor and return to the ER for worsening symptoms.

## 2024-01-14 NOTE — ED Triage Notes (Signed)
EMS reports from [facility] at [place], called out for unwitnessed mechanical fall in bathroom with head strike, Pt c/o dizziness, denies LOC, blood thinners or obvious injuries. Pt c/o left hip and back pain. Pt ambulatory at arrival. Hx of repeated falls.  BP 100/60 HR 70 RR 20 Sp02 98 RA CBG 331

## 2024-01-14 NOTE — ED Provider Notes (Signed)
[facility] AT [facility] Provider Note   CSN: [id number] Arrival date & time: 01/14/24  4332     History  Chief Complaint  Patient presents with   Fall   Dizziness    Judith [name] is a 83 y.o. female.  83 year old female with past medical history of of diabetes and hyperlipidemia presenting to the emergency department today with dizziness.  The patient had a fall this morning at her nursing facility.  She states she woke up with dizziness.  States it is more of a disequilibrium rather than lightheadedness.  She denies any associated chest pain.  She did fall and hit her head.  She did not lose consciousness.  She denies any nausea or vomiting.  She came to the ER today for further evaluation due to this when she could not get up.  She denies any focal weakness, numbness, or tingling.   Fall  Dizziness      Home Medications Prior to Admission medications   Medication Sig Start Date End Date Taking? Authorizing Provider  acetaminophen (TYLENOL) 500 MG tablet Take 1 tablet (500 mg total) by mouth every 6 (six) hours as needed. 11/18/23   Alfredo Martinez, MD  Biotin 1000 MCG tablet Take 1,000 mcg by mouth daily.     [provider]  carvedilol (COREG) 12.5 MG tablet Take 1 tablet (12.5 mg total) by mouth 2 (two) times daily with a meal. 05/17/23   McDiarmid, Leighton Roach, MD  celecoxib (CELEBREX) 50 MG capsule Take 1 capsule (50 mg total) by mouth 2 (two) times daily. 12/27/23   Dameron, Nolberto Hanlon, DO  cetirizine (ZYRTEC) 10 MG tablet Take 10 mg by mouth daily as needed (itching). Patient not taking: Reported on 08/01/2023    [provider]  colchicine 0.6 MG tablet Take 0.6 mg by mouth daily.    [provider]  diclofenac Sodium (VOLTAREN) 1 % GEL Apply 2 g topically 4 (four) times daily as needed (pain). 11/18/23   Alfredo Martinez, MD  DULoxetine (CYMBALTA) 60 MG capsule Take 60 mg by mouth daily.    [provider]   famotidine (PEPCID) 20 MG tablet Take 1 tablet (20 mg total) by mouth at bedtime. 06/11/19   Noni Saupe, MD  fluocinonide-emollient (LIDEX-E) 0.05 % cream Apply 1 Application topically See admin instructions. "Apply to stomach topically every 24 hours as needed for at bedtime" Patient not taking: Reported on 08/01/2023    [provider]  fluticasone (FLONASE) 50 MCG/ACT nasal spray Place 2 sprays into both nostrils daily. 11/01/22   Dameron, Nolberto Hanlon, DO  gabapentin (NEURONTIN) 100 MG capsule Take 100-400 mg by mouth See admin instructions. Take 100mg  in the AM, 100mg  in the afternoon, and 400mg  at bedtime.    [provider]  insulin glargine (LANTUS) 100 unit/mL SOPN Inject 18 Units into the skin daily. 05/18/23   Alfredo Martinez, MD  insulin lispro (HUMALOG KWIKPEN) 100 UNIT/ML KwikPen Inject 0-12 Units into the skin 3 (three) times daily before meals. 80-150 = 8 units, 151-200 = 9 units, 201-250 = 10 units, 251-300 = 11 units, 301-350 = 14 units, 351-400 = 16 units and recheck at next meal, and call MD if multiple readings > 350 in a day. 05/27/23   Dameron, Nolberto Hanlon, DO  Insulin Pen Needle (B-D ULTRAFINE III SHORT PEN) 31G X 8 MM MISC To use as directed with insulin, four times a day. Dx E11.9, Z79.4 11/08/17   Lezlie Lye, Irma  M, MD  LIDOCAINE EX Apply 1 patch topically daily. Apply to lower back topically in the morning for back pain and remove per schedule    [provider]  losartan (COZAAR) 50 MG tablet Take 1 tablet (50 mg total) by mouth daily. 08/30/22   Moses Manners, MD  metoCLOPramide (REGLAN) 10 MG tablet Take 1 tablet (10 mg total) by mouth 4 (four) times daily -  before meals and at bedtime. Take 1 tablet 10 mg by mouth 3 times/day before meals and at bedtime prn 09/09/23 10/09/23  Pyrtle, Carie Caddy, MD  mirtazapine (REMERON) 7.5 MG tablet Take 1 tablet (7.5 mg total) by mouth at bedtime. 11/12/23   Dameron, Nolberto Hanlon, DO  Multiple Vitamins-Minerals  (MULTIVITAMIN ADULT) CHEW Chew 1 tablet by mouth daily.    [provider]  ondansetron (ZOFRAN) 4 MG tablet Take 1 tablet (4 mg total) by mouth every 6 (six) hours. 04/24/23   Maury Dus, MD  oxyCODONE (ROXICODONE) 5 MG immediate release tablet Take 1 tablet (5 mg total) by mouth every 8 (eight) hours. 12/11/23   Dameron, Nolberto Hanlon, DO  polyethylene glycol powder (GLYCOLAX/MIRALAX) 17 GM/SCOOP powder Take 17 g by mouth daily. 09/04/23   Dameron, Nolberto Hanlon, DO  PRESCRIPTION MEDICATION Take 3 tablets by mouth See admin instructions. Glucose Chewable Tablet - Give 3 tablets by mouth every 15 minutes as needed for blood glucose less than 70. Repeat if blood glucose is still below 100    [provider]  rosuvastatin (CRESTOR) 10 MG tablet Take 1 tablet (10 mg total) by mouth daily. 11/15/20   Just, Azalee Course, FNP  solifenacin (VESICARE) 10 MG tablet Take 1 tablet (10 mg total) by mouth daily. 08/21/22   Dameron, Nolberto Hanlon, DO      Allergies    Asa [aspirin], Ibuprofen, Ciprofloxacin, and Sulfa antibiotics    Review of Systems   Review of Systems  Neurological:  Positive for dizziness.  All other systems reviewed and are negative.   Physical Exam Updated Vital Signs BP 137/63   Pulse 85   Temp (!) 97.3 F (36.3 C) (Oral)   Resp 16   SpO2 99%  Physical Exam Vitals and nursing note reviewed.   Gen: NAD Eyes: PERRL, EOMI, no obvious nystagmus HEENT: no oropharyngeal swelling Neck: trachea midline Resp: clear to auscultation bilaterally Card: RRR, no murmurs, rubs, or gallops Abd: nontender, nondistended Extremities: no calf tenderness, no edema Vascular: 2+ radial pulses bilaterally, 2+ DP pulses bilaterally Neuro: Cranial nerves intact with mild ptosis on the right which patient reports is a chronic issue for her, she has equal strength and sensation throughout bilateral upper and lower extremities, no dysmetria on finger-to-nose testing Skin: no rashes Psyc: acting  appropriately   ED Results / Procedures / Treatments   Labs (all labs ordered are listed, but only abnormal results are displayed) Labs Reviewed  BASIC METABOLIC PANEL - Abnormal; Notable for the following components:      Result Value   Sodium 132 (*)    Glucose, Bld 202 (*)    BUN 38 (*)    Calcium 8.5 (*)    All other components within normal limits  CBC - Abnormal; Notable for the following components:   WBC 12.5 (*)    RBC 3.51 (*)    Hemoglobin 11.7 (*)    HCT 35.5 (*)    MCV 101.1 (*)    All other components within normal limits    EKG EKG Interpretation Date/Time:  Tuesday January 14 2024 07:14:51 EST Ventricular Rate:  146 PR Interval:  201 QRS Duration:  162 QT Interval:  230 QTC Calculation: 340 R Axis:   -83  Text Interpretation: Normal sinus rhythm Left bundle branch block Artifact in lead(s) I III aVR aVL Confirmed by Beckey Downing [phone]) on 01/14/2024 7:55:07 AM  Radiology DG Hip Unilat W or Wo Pelvis 2-3 Views Left Result Date: 01/14/2024 CLINICAL DATA:  Status post fall.  Left hip and shoulder pain. EXAM: LEFT SHOULDER - 2+ VIEW; DG HIP (WITH OR WITHOUT PELVIS) 2-3V LEFT COMPARISON:  Pelvic CT 08/19/2023.  Chest radiographs 06/13/2023. FINDINGS: Left shoulder: Status post left shoulder arthroplasty. The hardware appears intact without evidence of loosening. There is no evidence of acute fracture or dislocation. Probable chronic erosive changes along the medial aspect of the left humeral neck, similar to prior chest radiographs. There are mild acromioclavicular degenerative changes. Left subclavian pacemaker noted. Left hip: AP pelvis with AP and cross-table lateral views of the left hip. The bones appear mildly demineralized. No evidence of acute fracture or dislocation. No evidence of femoral head osteonecrosis. The hip joint spaces are preserved. No focal soft tissue abnormalities are seen. IMPRESSION: 1. No evidence of acute fracture or dislocation in the left  shoulder or left hip. 2. Status post left shoulder arthroplasty without evidence of hardware complication. Electronically Signed   By: Carey Bullocks M.D.   On: 01/14/2024 09:24   DG Shoulder Left Result Date: 01/14/2024 CLINICAL DATA:  Status post fall.  Left hip and shoulder pain. EXAM: LEFT SHOULDER - 2+ VIEW; DG HIP (WITH OR WITHOUT PELVIS) 2-3V LEFT COMPARISON:  Pelvic CT 08/19/2023.  Chest radiographs 06/13/2023. FINDINGS: Left shoulder: Status post left shoulder arthroplasty. The hardware appears intact without evidence of loosening. There is no evidence of acute fracture or dislocation. Probable chronic erosive changes along the medial aspect of the left humeral neck, similar to prior chest radiographs. There are mild acromioclavicular degenerative changes. Left subclavian pacemaker noted. Left hip: AP pelvis with AP and cross-table lateral views of the left hip. The bones appear mildly demineralized. No evidence of acute fracture or dislocation. No evidence of femoral head osteonecrosis. The hip joint spaces are preserved. No focal soft tissue abnormalities are seen. IMPRESSION: 1. No evidence of acute fracture or dislocation in the left shoulder or left hip. 2. Status post left shoulder arthroplasty without evidence of hardware complication. Electronically Signed   By: Carey Bullocks M.D.   On: 01/14/2024 09:24   CT Head Wo Contrast Result Date: 01/14/2024 CLINICAL DATA:  Provided history: Headache, neuro deficit. Neck trauma. Additional history provided: Unwitnessed mechanical fall (with head strike). Dizziness. EXAM: CT HEAD WITHOUT CONTRAST CT CERVICAL SPINE WITHOUT CONTRAST TECHNIQUE: Multidetector CT imaging of the head and cervical spine was performed following the standard protocol without intravenous contrast. Multiplanar CT image reconstructions of the cervical spine were also generated. RADIATION DOSE REDUCTION: This exam was performed according to the departmental dose-optimization  program which includes automated exposure control, adjustment of the mA and/or kV according to patient size and/or use of iterative reconstruction technique. COMPARISON:  Head CT 01/07/2023.  Cervical spine CT 01/07/2023. FINDINGS: CT HEAD FINDINGS Brain: Generalized parenchymal volume loss. Chronic lacunar infarct again noted within the left lentiform nucleus. There is no acute intracranial hemorrhage. No demarcated cortical infarct. No extra-axial fluid collection. No evidence of an intracranial mass. No midline shift. Vascular: No hyperdense vessel.  Atherosclerotic calcifications. Skull: No calvarial fracture or aggressive osseous lesion. Sinuses/Orbits: No mass  or acute finding within the imaged orbits. Mild mucosal thickening within the right sphenoid sinus. CT CERVICAL SPINE FINDINGS Alignment: Nonspecific straightening of the expected cervical lordosis. Levocurvature of the cervical spine. Slight grade 1 anterolisthesis at C2-C3 and C4-C5. Slight grade 1 retrolisthesis at C5-C6. Skull base and vertebrae: The basion-dental and atlanto-dental intervals are maintained.No evidence of acute fracture to the cervical spine. Soft tissues and spinal canal: No prevertebral fluid or swelling. No visible canal hematoma. Disc levels: Cervical spondylosis with multilevel disc space narrowing, disc bulges/central disc protrusions, uncovertebral hypertrophy and facet arthrosis. Disc space narrowing is greatest at C5-C6 and C6-C7 (advanced at these levels). C6-C7 vertebral ankylosis. C5-C6 posterior disc osteophyte complex. No appreciable high-grade spinal canal stenosis within the cervical spine. Bilateral C5-C6 bony neural foraminal narrowing. Degenerative changes also present at the C1-C2 articulation. Upper chest: No consolidation within the imaged lung apices. No visible pneumothorax. Biapical pleuroparenchymal scarring. IMPRESSION: CT head: 1.  No evidence of an acute intracranial abnormality. 2. Chronic left basal  ganglia lacunar infarct again noted. 3. Generalized parenchymal atrophy. 4. Mild right sphenoid sinus mucosal thickening. CT cervical spine: 1. No evidence of an acute cervical spine fracture. 2. Nonspecific straightening of the expected cervical lordosis. 3. Levocurvature of the cervical spine. 4. Mild grade 1 spondylolisthesis at C2-C3, C4-C5 and C5-C6. 5. Cervical spondylosis as described. 6. C6-C7 vertebral ankylosis. Electronically Signed   By: Jackey Loge D.O.   On: 01/14/2024 08:38   CT Cervical Spine Wo Contrast Result Date: 01/14/2024 CLINICAL DATA:  Provided history: Headache, neuro deficit. Neck trauma. Additional history provided: Unwitnessed mechanical fall (with head strike). Dizziness. EXAM: CT HEAD WITHOUT CONTRAST CT CERVICAL SPINE WITHOUT CONTRAST TECHNIQUE: Multidetector CT imaging of the head and cervical spine was performed following the standard protocol without intravenous contrast. Multiplanar CT image reconstructions of the cervical spine were also generated. RADIATION DOSE REDUCTION: This exam was performed according to the departmental dose-optimization program which includes automated exposure control, adjustment of the mA and/or kV according to patient size and/or use of iterative reconstruction technique. COMPARISON:  Head CT 01/07/2023.  Cervical spine CT 01/07/2023. FINDINGS: CT HEAD FINDINGS Brain: Generalized parenchymal volume loss. Chronic lacunar infarct again noted within the left lentiform nucleus. There is no acute intracranial hemorrhage. No demarcated cortical infarct. No extra-axial fluid collection. No evidence of an intracranial mass. No midline shift. Vascular: No hyperdense vessel.  Atherosclerotic calcifications. Skull: No calvarial fracture or aggressive osseous lesion. Sinuses/Orbits: No mass or acute finding within the imaged orbits. Mild mucosal thickening within the right sphenoid sinus. CT CERVICAL SPINE FINDINGS Alignment: Nonspecific straightening of the  expected cervical lordosis. Levocurvature of the cervical spine. Slight grade 1 anterolisthesis at C2-C3 and C4-C5. Slight grade 1 retrolisthesis at C5-C6. Skull base and vertebrae: The basion-dental and atlanto-dental intervals are maintained.No evidence of acute fracture to the cervical spine. Soft tissues and spinal canal: No prevertebral fluid or swelling. No visible canal hematoma. Disc levels: Cervical spondylosis with multilevel disc space narrowing, disc bulges/central disc protrusions, uncovertebral hypertrophy and facet arthrosis. Disc space narrowing is greatest at C5-C6 and C6-C7 (advanced at these levels). C6-C7 vertebral ankylosis. C5-C6 posterior disc osteophyte complex. No appreciable high-grade spinal canal stenosis within the cervical spine. Bilateral C5-C6 bony neural foraminal narrowing. Degenerative changes also present at the C1-C2 articulation. Upper chest: No consolidation within the imaged lung apices. No visible pneumothorax. Biapical pleuroparenchymal scarring. IMPRESSION: CT head: 1.  No evidence of an acute intracranial abnormality. 2. Chronic left basal ganglia lacunar infarct  again noted. 3. Generalized parenchymal atrophy. 4. Mild right sphenoid sinus mucosal thickening. CT cervical spine: 1. No evidence of an acute cervical spine fracture. 2. Nonspecific straightening of the expected cervical lordosis. 3. Levocurvature of the cervical spine. 4. Mild grade 1 spondylolisthesis at C2-C3, C4-C5 and C5-C6. 5. Cervical spondylosis as described. 6. C6-C7 vertebral ankylosis. Electronically Signed   By: Jackey Loge D.O.   On: 01/14/2024 08:38    Procedures Procedures    Medications Ordered in ED Medications  meclizine (ANTIVERT) tablet 25 mg (25 mg Oral Given 01/14/24 0838)  sodium chloride 0.9 % bolus 500 mL (500 mLs Intravenous New Bag/Given 01/14/24 1002)    ED Course/ Medical Decision Making/ A&P                                 Medical Decision Making 83 year old female  presents emergency department today with dizziness as well as a fall at her nursing facility.  I will further evaluate patient here with a CT scan of her head and cervical spine for evaluation for acute traumatic injuries in addition to an x-ray of her left shoulder and left hip.  I will give patient meclizine here and obtain basic labs to evaluate for anemia or electrolyte abnormalities.  Will keep her on the monitor to evaluate for arrhythmias.  I will reevaluate for ultimate disposition.  Her EKG interpreted by me shows a sinus rhythm with left bundle branch block  The patient's labs here are reassuring.  She was able to ambulate with a walker with some assistance.  I did call and discussed this with her nursing facility and this does appear to be at her baseline.  They report that she is on palliative care due to her multiple medical issues.  I think that she is stable for discharge back to her facility.  Amount and/or Complexity of Data Reviewed Labs: ordered. Radiology: ordered.           Final Clinical Impression(s) / ED Diagnoses Final diagnoses:  Fall at nursing home, initial encounter    Rx / DC Orders ED Discharge Orders     None         Durwin Glaze, MD 01/14/24 [phone]

## 2024-01-21 ENCOUNTER — Emergency Department (HOSPITAL_COMMUNITY)
Admission: EM | Admit: 2024-01-21 | Discharge: 2024-01-21 | Disposition: A | Discharge status: Skilled Nursing Facility | Payer: 59 | Attending: Emergency Medicine | Admitting: Emergency Medicine

## 2024-01-21 ENCOUNTER — Emergency Department (HOSPITAL_COMMUNITY): Payer: 59

## 2024-01-21 DIAGNOSIS — I10 Essential (primary) hypertension: Secondary | ICD-10-CM | POA: Diagnosis not present

## 2024-01-21 DIAGNOSIS — Z950 Presence of cardiac pacemaker: Secondary | ICD-10-CM | POA: Insufficient documentation

## 2024-01-21 DIAGNOSIS — W01198A Fall on same level from slipping, tripping and stumbling with subsequent striking against other object, initial encounter: Secondary | ICD-10-CM | POA: Insufficient documentation

## 2024-01-21 DIAGNOSIS — S0990XA Unspecified injury of head, initial encounter: Secondary | ICD-10-CM

## 2024-01-21 DIAGNOSIS — E1140 Type 2 diabetes mellitus with diabetic neuropathy, unspecified: Secondary | ICD-10-CM | POA: Diagnosis not present

## 2024-01-21 DIAGNOSIS — S0003XA Contusion of scalp, initial encounter: Secondary | ICD-10-CM | POA: Insufficient documentation

## 2024-01-21 DIAGNOSIS — W19XXXA Unspecified fall, initial encounter: Secondary | ICD-10-CM

## 2024-01-21 NOTE — Discharge Instructions (Signed)
ET scans and x-rays did not show any internal bleeding or broken bones.  Please continue your home medications and follow closely with your primary care doctor in the coming week.

## 2024-01-21 NOTE — ED Triage Notes (Signed)
Per ems pt was at doctor's appointment, lost her balance and fell and hit her head on the metal tray in the office. Small hematoma on forehead, denies blood thinner use, denies complaints

## 2024-01-21 NOTE — ED Provider Notes (Signed)
Emergency Department Provider Note   I have reviewed the triage vital signs and the nursing notes.   HISTORY  Chief Complaint Fall   HPI Judith [name] is a 83 y.o. female who presents to the emergency department for evaluation of pain after a fall.  She was apparently had a doctor's appointment when she lost her balance, falling, hitting her head on a tray.  No report of blood thinning medications.  No loss of consciousness.  Patient denies any other area of pain other than her head.  Notes that she is feeling well otherwise.    Past Medical History:  Diagnosis Date   Abdominal pain 06/13/2023   Allergy 1990   Anemia    Arthritis    Biliary gastritis    Blood transfusion without reported diagnosis    2007   Cervical spine arthritis 04/05/2016   Choledocholithiasis    Controlled type 2 diabetes mellitus with diabetic neuropathy, with Fantasy Donald-term current use of insulin (HCC)    Depression    Diabetes mellitus type 2 with complications (HCC)    Dilated bile duct    Duodenal ulcer disease    Elevated liver function tests    Encounter for Taylen Wendland-term (current) use of insulin (HCC)    Encounter to establish care with new doctor 08/07/2022   Fall 01/11/2023   Gallstones 01/17/2018   Gastric ulcer    Gastritis and gastroduodenitis    Gastroparesis    severe   GERD (gastroesophageal reflux disease)    Gout    tested for gout but was not   Heart murmur    Hiatal hernia    Hyperlipidemia    in past not now   Hypertension    Hypokalemia, gastrointestinal losses    secondary to severe gastroparesis   Internal hemorrhoids    Debraann Livingstone-term insulin use in type 2 diabetes (HCC) 04/05/2016   Osteoporosis    just had bone density test possible    Peripheral neuropathy    Postnasal drip 11/01/2022   Presence of cardiac pacemaker    S/P partial gastrectomy    due to severe gastric and gudoenal ulcers   Seasonal allergies    Sleep apnea    could not use CPAP   UTI (urinary tract  infection)     Review of Systems  Constitutional: No fever/chills Cardiovascular: Denies chest pain. Respiratory: Denies shortness of breath. Gastrointestinal: No abdominal pain.  No nausea, no vomiting. Musculoskeletal: Negative for back pain. Skin: Negative for rash. Neurological: Negative for focal weakness or numbness. Positive HA.    ____________________________________________   PHYSICAL EXAM:  VITAL SIGNS: ED Triage Vitals  Encounter Vitals Group     BP 01/21/24 1814 (!) 151/76     Pulse Rate 01/21/24 1814 74     Resp 01/21/24 1814 18     Temp 01/21/24 1814 97.9 F (36.6 C)     Temp Source 01/21/24 1814 Oral     SpO2 01/21/24 1814 94 %     Weight 01/21/24 1812 104 lb 11.5 oz (47.5 kg)     Height 01/21/24 1812 5\' 3"  (1.6 m)   Constitutional: Alert and oriented. Well appearing and in no acute distress. Eyes: Conjunctivae are normal.  Head: Small approximately 3 cm hematoma to the right temporal scalp. No laceration or abrasion.  Nose: No congestion/rhinnorhea. Mouth/Throat: Mucous membranes are moist.  Oropharynx non-erythematous. Neck: No stridor.  No cervical spine tenderness to palpation. Cardiovascular: Normal rate, regular rhythm. Good peripheral circulation. Grossly normal heart  sounds.   Respiratory: Normal respiratory effort.  No retractions. Lungs CTAB. Gastrointestinal: Soft and nontender. No distention.  Musculoskeletal: No lower extremity tenderness nor edema. No gross deformities of extremities. Normal ROM of the bilateral upper and lower extremities.  Neurologic:  Normal speech and language. No gross focal neurologic deficits are appreciated.  Skin:  Skin is warm, dry and intact. No rash noted.  ____________________________________________   PROCEDURES  Procedure(s) performed:   Procedures  None  ____________________________________________   INITIAL IMPRESSION / ASSESSMENT AND PLAN / ED COURSE  Pertinent labs & imaging results that were  available during my care of the patient were reviewed by me and considered in my medical decision making (see chart for details).   This patient is Presenting for Evaluation of head injury, which does require a range of treatment options, and is a complaint that involves a high risk of morbidity and mortality.  The Differential Diagnoses includes subdural hematoma, epidural hematoma, acute concussion, traumatic subarachnoid hemorrhage, cerebral contusions, etc.   I decided to review pertinent External Data, and in summary patient with ED presentation on 01/14/24 with similar presentation.   Radiologic Tests Ordered, included CT head, c spine, chest and pelvis XR. I independently interpreted the images and agree with radiology interpretation.    Social Determinants of Health Risk patient is a non-smoker.   Medical Decision Making: Summary:  Patient presents to the emergency department with pain after mechanical fall.  Plan for plain films and imaging.  Vitals are normal. Defer further labs for now. Had fairly extensive labs and ED workup recently for similar. Patient describes more mechanical type mechanism today.   Reevaluation with update and discussion with patient. CT and XR imaging reassuring. Stable for return to her facility.    Patient's presentation is most consistent with acute presentation with potential threat to life or bodily function.   Disposition: discharge  ____________________________________________  FINAL CLINICAL IMPRESSION(S) / ED DIAGNOSES  Final diagnoses:  Fall, initial encounter  Injury of head, initial encounter     Note:  This document was prepared using Dragon voice recognition software and may include unintentional dictation errors.  Alona Bene, MD, [facility]    Kalep Full, Arlyss Repress, MD 01/24/24 [phone]

## 2024-01-21 NOTE — ED Notes (Signed)
[facility] called for transport back to facility

## 2024-01-25 ENCOUNTER — Inpatient Hospital Stay (HOSPITAL_COMMUNITY)
Admission: EM | Admit: 2024-01-25 | Discharge: 2024-02-03 | DRG: 091 | Disposition: A | Discharge status: Skilled Nursing Facility | Payer: 59 | Source: Skilled Nursing Facility | Attending: Family Medicine | Admitting: Family Medicine

## 2024-01-25 ENCOUNTER — Encounter (HOSPITAL_COMMUNITY): Payer: Self-pay | Admitting: Emergency Medicine

## 2024-01-25 ENCOUNTER — Other Ambulatory Visit: Payer: Self-pay

## 2024-01-25 ENCOUNTER — Emergency Department (HOSPITAL_COMMUNITY): Payer: 59

## 2024-01-25 ENCOUNTER — Inpatient Hospital Stay (HOSPITAL_COMMUNITY): Payer: 59

## 2024-01-25 DIAGNOSIS — E1140 Type 2 diabetes mellitus with diabetic neuropathy, unspecified: Secondary | ICD-10-CM | POA: Diagnosis not present

## 2024-01-25 DIAGNOSIS — I2510 Atherosclerotic heart disease of native coronary artery without angina pectoris: Secondary | ICD-10-CM | POA: Diagnosis present

## 2024-01-25 DIAGNOSIS — K229 Disease of esophagus, unspecified: Secondary | ICD-10-CM | POA: Diagnosis not present

## 2024-01-25 DIAGNOSIS — N3000 Acute cystitis without hematuria: Principal | ICD-10-CM

## 2024-01-25 DIAGNOSIS — N390 Urinary tract infection, site not specified: Secondary | ICD-10-CM | POA: Diagnosis present

## 2024-01-25 DIAGNOSIS — Z79899 Other long term (current) drug therapy: Secondary | ICD-10-CM | POA: Diagnosis not present

## 2024-01-25 DIAGNOSIS — E43 Unspecified severe protein-calorie malnutrition: Secondary | ICD-10-CM | POA: Diagnosis present

## 2024-01-25 DIAGNOSIS — R55 Syncope and collapse: Secondary | ICD-10-CM | POA: Diagnosis present

## 2024-01-25 DIAGNOSIS — I951 Orthostatic hypotension: Secondary | ICD-10-CM | POA: Diagnosis present

## 2024-01-25 DIAGNOSIS — R531 Weakness: Secondary | ICD-10-CM | POA: Diagnosis not present

## 2024-01-25 DIAGNOSIS — F0153 Vascular dementia, unspecified severity, with mood disturbance: Secondary | ICD-10-CM | POA: Diagnosis present

## 2024-01-25 DIAGNOSIS — R680 Hypothermia, not associated with low environmental temperature: Secondary | ICD-10-CM | POA: Diagnosis present

## 2024-01-25 DIAGNOSIS — I10 Essential (primary) hypertension: Secondary | ICD-10-CM | POA: Diagnosis present

## 2024-01-25 DIAGNOSIS — W19XXXA Unspecified fall, initial encounter: Secondary | ICD-10-CM | POA: Diagnosis present

## 2024-01-25 DIAGNOSIS — F05 Delirium due to known physiological condition: Secondary | ICD-10-CM | POA: Diagnosis not present

## 2024-01-25 DIAGNOSIS — S80219A Abrasion, unspecified knee, initial encounter: Secondary | ICD-10-CM | POA: Insufficient documentation

## 2024-01-25 DIAGNOSIS — R911 Solitary pulmonary nodule: Secondary | ICD-10-CM | POA: Diagnosis present

## 2024-01-25 DIAGNOSIS — Z951 Presence of aortocoronary bypass graft: Secondary | ICD-10-CM | POA: Diagnosis not present

## 2024-01-25 DIAGNOSIS — B9620 Unspecified Escherichia coli [E. coli] as the cause of diseases classified elsewhere: Secondary | ICD-10-CM | POA: Diagnosis present

## 2024-01-25 DIAGNOSIS — E1142 Type 2 diabetes mellitus with diabetic polyneuropathy: Secondary | ICD-10-CM | POA: Diagnosis present

## 2024-01-25 DIAGNOSIS — F329 Major depressive disorder, single episode, unspecified: Secondary | ICD-10-CM | POA: Diagnosis not present

## 2024-01-25 DIAGNOSIS — E039 Hypothyroidism, unspecified: Secondary | ICD-10-CM | POA: Diagnosis present

## 2024-01-25 DIAGNOSIS — Z79891 Long term (current) use of opiate analgesic: Secondary | ICD-10-CM

## 2024-01-25 DIAGNOSIS — E11649 Type 2 diabetes mellitus with hypoglycemia without coma: Secondary | ICD-10-CM | POA: Diagnosis present

## 2024-01-25 DIAGNOSIS — R739 Hyperglycemia, unspecified: Secondary | ICD-10-CM | POA: Diagnosis not present

## 2024-01-25 DIAGNOSIS — N3001 Acute cystitis with hematuria: Principal | ICD-10-CM | POA: Diagnosis present

## 2024-01-25 DIAGNOSIS — I442 Atrioventricular block, complete: Secondary | ICD-10-CM | POA: Diagnosis present

## 2024-01-25 DIAGNOSIS — Z903 Acquired absence of stomach [part of]: Secondary | ICD-10-CM

## 2024-01-25 DIAGNOSIS — Z950 Presence of cardiac pacemaker: Secondary | ICD-10-CM | POA: Diagnosis not present

## 2024-01-25 DIAGNOSIS — G4730 Sleep apnea, unspecified: Secondary | ICD-10-CM | POA: Diagnosis present

## 2024-01-25 DIAGNOSIS — Z681 Body mass index (BMI) 19 or less, adult: Secondary | ICD-10-CM

## 2024-01-25 DIAGNOSIS — R410 Disorientation, unspecified: Secondary | ICD-10-CM | POA: Diagnosis not present

## 2024-01-25 DIAGNOSIS — F32A Depression, unspecified: Secondary | ICD-10-CM | POA: Diagnosis present

## 2024-01-25 DIAGNOSIS — Z794 Long term (current) use of insulin: Secondary | ICD-10-CM | POA: Diagnosis not present

## 2024-01-25 DIAGNOSIS — I3481 Nonrheumatic mitral (valve) annulus calcification: Secondary | ICD-10-CM | POA: Diagnosis present

## 2024-01-25 DIAGNOSIS — E785 Hyperlipidemia, unspecified: Secondary | ICD-10-CM | POA: Diagnosis present

## 2024-01-25 DIAGNOSIS — E1165 Type 2 diabetes mellitus with hyperglycemia: Secondary | ICD-10-CM | POA: Diagnosis present

## 2024-01-25 DIAGNOSIS — E1143 Type 2 diabetes mellitus with diabetic autonomic (poly)neuropathy: Secondary | ICD-10-CM | POA: Diagnosis present

## 2024-01-25 DIAGNOSIS — S80211A Abrasion, right knee, initial encounter: Secondary | ICD-10-CM | POA: Diagnosis present

## 2024-01-25 DIAGNOSIS — R296 Repeated falls: Principal | ICD-10-CM

## 2024-01-25 DIAGNOSIS — K3184 Gastroparesis: Secondary | ICD-10-CM | POA: Diagnosis present

## 2024-01-25 DIAGNOSIS — Z791 Long term (current) use of non-steroidal anti-inflammatories (NSAID): Secondary | ICD-10-CM

## 2024-01-25 DIAGNOSIS — T68XXXA Hypothermia, initial encounter: Secondary | ICD-10-CM

## 2024-01-25 LAB — CBC WITH DIFFERENTIAL/PLATELET
Abs Immature Granulocytes: 0.05 10*3/uL (ref 0.00–0.07)
Basophils Absolute: 0.1 10*3/uL (ref 0.0–0.1)
Basophils Relative: 1 %
Eosinophils Absolute: 0.3 10*3/uL (ref 0.0–0.5)
Eosinophils Relative: 2 %
HCT: 38.7 % (ref 36.0–46.0)
Hemoglobin: 12.2 g/dL (ref 12.0–15.0)
Immature Granulocytes: 0 %
Lymphocytes Relative: 13 %
Lymphs Abs: 1.5 10*3/uL (ref 0.7–4.0)
MCH: 33.3 pg (ref 26.0–34.0)
MCHC: 31.5 g/dL (ref 30.0–36.0)
MCV: 105.7 fL — ABNORMAL HIGH (ref 80.0–100.0)
Monocytes Absolute: 0.5 10*3/uL (ref 0.1–1.0)
Monocytes Relative: 4 %
Neutro Abs: 9.1 10*3/uL — ABNORMAL HIGH (ref 1.7–7.7)
Neutrophils Relative %: 80 %
Platelets: 362 10*3/uL (ref 150–400)
RBC: 3.66 MIL/uL — ABNORMAL LOW (ref 3.87–5.11)
RDW: 14.3 % (ref 11.5–15.5)
WBC: 11.4 10*3/uL — ABNORMAL HIGH (ref 4.0–10.5)
nRBC: 0 % (ref 0.0–0.2)

## 2024-01-25 LAB — URINALYSIS, W/ REFLEX TO CULTURE (INFECTION SUSPECTED)
Bilirubin Urine: NEGATIVE
Glucose, UA: NEGATIVE mg/dL
Hgb urine dipstick: NEGATIVE
Ketones, ur: NEGATIVE mg/dL
Nitrite: POSITIVE — AB
Protein, ur: NEGATIVE mg/dL
Specific Gravity, Urine: 1.01 (ref 1.005–1.030)
pH: 5 (ref 5.0–8.0)

## 2024-01-25 LAB — BASIC METABOLIC PANEL
Anion gap: 9 (ref 5–15)
BUN: 16 mg/dL (ref 8–23)
CO2: 20 mmol/L — ABNORMAL LOW (ref 22–32)
Calcium: 8.4 mg/dL — ABNORMAL LOW (ref 8.9–10.3)
Chloride: 107 mmol/L (ref 98–111)
Creatinine, Ser: 0.79 mg/dL (ref 0.44–1.00)
GFR, Estimated: >60 mL/min (ref >60)
Glucose, Bld: 114 mg/dL — ABNORMAL HIGH (ref 70–99)
Potassium: 4.3 mmol/L (ref 3.5–5.1)
Sodium: 136 mmol/L (ref 135–145)

## 2024-01-25 LAB — ECHOCARDIOGRAM COMPLETE
AR max vel: 3.11 cm2
AV Area VTI: 3.1 cm2
AV Area mean vel: 3.08 cm2
AV Mean grad: 2 mm[Hg]
AV Peak grad: 3.7 mm[Hg]
Ao pk vel: 0.97 m/s
Area-P 1/2: 5.02 cm2
Calc EF: 53.2 %
Height: 63 in
MV VTI: 1.88 cm2
S' Lateral: 2.3 cm
Single Plane A2C EF: 53.2 %
Single Plane A4C EF: 54.1 %
Weight: 1675.5 [oz_av]

## 2024-01-25 LAB — COMPREHENSIVE METABOLIC PANEL
ALT: 11 U/L (ref 0–44)
AST: 25 U/L (ref 15–41)
Albumin: 2.7 g/dL — ABNORMAL LOW (ref 3.5–5.0)
Alkaline Phosphatase: 53 U/L (ref 38–126)
Anion gap: 11 (ref 5–15)
BUN: 16 mg/dL (ref 8–23)
CO2: 20 mmol/L — ABNORMAL LOW (ref 22–32)
Calcium: 8.7 mg/dL — ABNORMAL LOW (ref 8.9–10.3)
Chloride: 106 mmol/L (ref 98–111)
Creatinine, Ser: 0.9 mg/dL (ref 0.44–1.00)
GFR, Estimated: >60 mL/min (ref >60)
Glucose, Bld: 107 mg/dL — ABNORMAL HIGH (ref 70–99)
Potassium: 4.5 mmol/L (ref 3.5–5.1)
Sodium: 137 mmol/L (ref 135–145)
Total Bilirubin: 0.8 mg/dL (ref 0.0–1.2)
Total Protein: 5.2 g/dL — ABNORMAL LOW (ref 6.5–8.1)

## 2024-01-25 LAB — RAPID URINE DRUG SCREEN, HOSP PERFORMED
Amphetamines: NOT DETECTED
Barbiturates: NOT DETECTED
Benzodiazepines: NOT DETECTED
Cocaine: NOT DETECTED
Opiates: NOT DETECTED
Tetrahydrocannabinol: NOT DETECTED

## 2024-01-25 LAB — CBC
HCT: 33.2 % — ABNORMAL LOW (ref 36.0–46.0)
Hemoglobin: 10.6 g/dL — ABNORMAL LOW (ref 12.0–15.0)
MCH: 32.9 pg (ref 26.0–34.0)
MCHC: 31.9 g/dL (ref 30.0–36.0)
MCV: 103.1 fL — ABNORMAL HIGH (ref 80.0–100.0)
Platelets: 326 10*3/uL (ref 150–400)
RBC: 3.22 MIL/uL — ABNORMAL LOW (ref 3.87–5.11)
RDW: 14.3 % (ref 11.5–15.5)
WBC: 10.6 10*3/uL — ABNORMAL HIGH (ref 4.0–10.5)
nRBC: 0 % (ref 0.0–0.2)

## 2024-01-25 LAB — I-STAT CG4 LACTIC ACID, ED: Lactic Acid, Venous: 1.7 mmol/L (ref 0.5–1.9)

## 2024-01-25 LAB — CBG MONITORING, ED
Glucose-Capillary: 121 mg/dL — ABNORMAL HIGH (ref 70–99)
Glucose-Capillary: 74 mg/dL (ref 70–99)
Glucose-Capillary: 164 mg/dL — ABNORMAL HIGH (ref 70–99)

## 2024-01-25 LAB — RESP PANEL BY RT-PCR (RSV, FLU A&B, COVID)  RVPGX2
Influenza A by PCR: NEGATIVE
Influenza B by PCR: NEGATIVE
Resp Syncytial Virus by PCR: NEGATIVE
SARS Coronavirus 2 by RT PCR: NEGATIVE

## 2024-01-25 LAB — ETHANOL: Alcohol, Ethyl (B): <10 mg/dL (ref <10)

## 2024-01-25 LAB — PROTIME-INR
INR: 1.2 (ref 0.8–1.2)
Prothrombin Time: 15.8 s — ABNORMAL HIGH (ref 11.4–15.2)

## 2024-01-25 LAB — TROPONIN I (HIGH SENSITIVITY)
Troponin I (High Sensitivity): 20 ng/L — ABNORMAL HIGH (ref <18)
Troponin I (High Sensitivity): 20 ng/L — ABNORMAL HIGH (ref <18)

## 2024-01-25 LAB — APTT: aPTT: 29 s (ref 24–36)

## 2024-01-25 LAB — GLUCOSE, CAPILLARY
Glucose-Capillary: 257 mg/dL — ABNORMAL HIGH (ref 70–99)
Glucose-Capillary: 234 mg/dL — ABNORMAL HIGH (ref 70–99)

## 2024-01-25 LAB — TSH: TSH: 1.347 u[IU]/mL (ref 0.350–4.500)

## 2024-01-25 LAB — CK: Total CK: 74 U/L (ref 38–234)

## 2024-01-25 MED ORDER — ENSURE ENLIVE PO LIQD
237.0000 mL | Freq: Two times a day (BID) | ORAL | Status: DC
  Administered 2024-01-26 – 2024-02-03 (×17): 237 mL via ORAL

## 2024-01-25 MED ORDER — FAMOTIDINE 20 MG PO TABS
20.0000 mg | ORAL_TABLET | Freq: Every day | ORAL | Status: DC
  Administered 2024-01-25 – 2024-02-02 (×9): 20 mg via ORAL
  Filled 2024-01-25 (×9): qty 1

## 2024-01-25 MED ORDER — POLYETHYLENE GLYCOL 3350 17 G PO PACK
17.0000 g | PACK | Freq: Every day | ORAL | Status: DC
  Administered 2024-01-25 – 2024-02-03 (×8): 17 g via ORAL
  Filled 2024-01-25 (×10): qty 1

## 2024-01-25 MED ORDER — ROSUVASTATIN CALCIUM 5 MG PO TABS
10.0000 mg | ORAL_TABLET | Freq: Every day | ORAL | Status: DC
  Administered 2024-01-25 – 2024-02-03 (×10): 10 mg via ORAL
  Filled 2024-01-25 (×10): qty 2

## 2024-01-25 MED ORDER — ADULT MULTIVITAMIN W/MINERALS CH
1.0000 | ORAL_TABLET | Freq: Every day | ORAL | Status: DC
  Administered 2024-01-25 – 2024-02-03 (×10): 1 via ORAL
  Filled 2024-01-25 (×10): qty 1

## 2024-01-25 MED ORDER — CEFADROXIL 500 MG PO CAPS
500.0000 mg | ORAL_CAPSULE | Freq: Two times a day (BID) | ORAL | Status: AC
  Administered 2024-01-26 – 2024-01-29 (×6): 500 mg via ORAL
  Filled 2024-01-25 (×7): qty 1

## 2024-01-25 MED ORDER — SODIUM CHLORIDE 0.9 % IV SOLN
1.0000 g | Freq: Once | INTRAVENOUS | Status: AC
  Administered 2024-01-25: 1 g via INTRAVENOUS
  Filled 2024-01-25: qty 10

## 2024-01-25 MED ORDER — ENOXAPARIN SODIUM 40 MG/0.4ML IJ SOSY
40.0000 mg | PREFILLED_SYRINGE | INTRAMUSCULAR | Status: DC
  Administered 2024-01-25 – 2024-02-02 (×9): 40 mg via SUBCUTANEOUS
  Filled 2024-01-25 (×8): qty 0.4

## 2024-01-25 MED ORDER — ACETAMINOPHEN 325 MG PO TABS
650.0000 mg | ORAL_TABLET | Freq: Four times a day (QID) | ORAL | Status: DC | PRN
  Administered 2024-01-25 – 2024-02-01 (×13): 650 mg via ORAL
  Filled 2024-01-25 (×15): qty 2

## 2024-01-25 MED ORDER — MIRTAZAPINE 15 MG PO TABS
7.5000 mg | ORAL_TABLET | Freq: Every day | ORAL | Status: DC
  Administered 2024-01-25 – 2024-02-02 (×9): 7.5 mg via ORAL
  Filled 2024-01-25 (×8): qty 1

## 2024-01-25 MED ORDER — DICLOFENAC SODIUM 1 % EX GEL
2.0000 g | Freq: Four times a day (QID) | CUTANEOUS | Status: DC | PRN
Start: 2024-01-25 — End: 2024-02-03
  Administered 2024-01-28: 2 g via TOPICAL
  Filled 2024-01-25 (×2): qty 100

## 2024-01-25 MED ORDER — ACETAMINOPHEN 650 MG RE SUPP
650.0000 mg | Freq: Four times a day (QID) | RECTAL | Status: DC | PRN
Start: 2024-01-25 — End: 2024-02-03

## 2024-01-25 NOTE — Assessment & Plan Note (Addendum)
Hx of falls. Unwitnessed fall with head strike, without LOC. No signs of active bleeding other than from R knee abrasion. CK wnl. CT head negative for acute intracranial abnormality and fracture. Mild R infraorbital soft tissue swelling noted. CT neck negative for fracture or listhesis but did demonstrate extensive debris noted in the paraesophageal tissues, possible impacted food bolus vs retained debris. Likely due to environmental factors vs vasovagal, however will rule out other etiology with further imaging.  - Admit to [facility], attending Dr. Manson Passey - Med-Tele, Vital signs per floor - Regular diet, s/p bedside swallow - PT/OT to treat - VTE prophylaxis: Lovenox - Continue antibiotics: CTX for UTI - Echo - Interrogate pacemaker - Orthostatic VS - MRI Brain w/o contrast for concern for possible stroke - Pain: Tylenol q6h PRN, Voltaren gel QID PRN - Avoid sedative medications - Fall precautions

## 2024-01-25 NOTE — ED Notes (Addendum)
The pt has been calling on her  call bell  every few minutes  I went in again just now and she was just  holding on to her call bell pushing it  when I asked what she needed she did not reply just looked at me.  According to the off going rn the pt just arrived to this section  as I came in

## 2024-01-25 NOTE — ED Notes (Signed)
Pt is axox4. RN gave pt 8oz of OJ

## 2024-01-25 NOTE — Hospital Course (Signed)
Judith [name] is a 83 y.o.female with a history of hypertension, pulmonary nodules, sleep apnea, gastroparesis, type 2 diabetes, neuropathy, depression, history of SI passive, weight loss who was admitted to the [facility] Teaching Service at [facility] for fall. Her hospital course is detailed below:  Fall Unwitnessed at [place] living facility. Imaging overall without fracture or bleed. Echo obtained and showed ***. PT/OT recommended ***.   Hypothermia  Required Bair in ED with hypothermia to 94.   UTI S/p Rocephin with transition to oral abx   Other chronic conditions were medically managed with home medications and formulary alternatives as necessary (***)  PCP Follow-up Recommendations: Incidental 3mm pulmonary nodules noted on low dose CT in June 2024 - recommended repeat CT 6-12 months Will hold carvedilol and her oxycodone at time of discharge most likely given the side effects from these.

## 2024-01-25 NOTE — Assessment & Plan Note (Addendum)
Denies urinary symptoms. UA positive for many bacteria, trace leukocytes. and nitrites. S/p CTX in ED. - Continue CTX - Follow urine culture

## 2024-01-25 NOTE — ED Notes (Signed)
 Pacemaker interrogated.

## 2024-01-25 NOTE — H&P (Addendum)
Hospital Admission History and Physical Service Pager: [phone]  Patient name: Judith [name] Medical record number: [id number] Date of Birth: 12/30/1940 Age: 83 y.o. Gender: female  Primary Care Provider: Darral Dash, DO Consultants: None Code Status: Full Code Preferred Emergency Contact:  Contact Information     Name Relation Home Work [place] Daughter [phone]  [phone]   Chief Complaint: Fall  Assessment and Plan: Judith [name] is a 83 y.o. female with past medical history T2DM, multilevel DDD, peripheral neuropathy, depression, unintentional weight loss, and CAD s/p CABG and pacemaker, complete heart block s/p pacemaker presenting with fall. Found to be hypothermic and placed on a bair hugger. Found to have UTI and started on CTX. Differential for presentation of this includes:  Fall Mechanical Fall: Unsure of patient's surroundings put definitely a possibility with her living at home and usually using a walker. Has h/o of recurrent falls at facility  Vasovagal Syncope: Possible due to getting up out of bed to go to the bathroom. Stroke: Unlikely with no focal neurological deficits on exam. Able to recall the events that took place.  Seizure: No notable hx of tremor or postictal state with fall.  Hypoglycemia Metabolic: Glucose on arrival 107. Lytes wnl.  Polypharmacy: Takes a lot of medications and some with sedating effect, so this could be a possibility. Waiting on fax from facility for official medication reconciliation as patient is unable to provide Neuropathy: Hx of peripheral neuropathy on gabapentin. She may have lost her footing because of this resulting in the fall.  Syncope: Denied by patient, but will obtain echo and keep on cardiac tele. Trops trended flat. PPM interrogation, has history of complete heart block. Anemia: Monitor CBC  Hypothermia Sepsis/Infection: UA positive for UTI. However she did not meet SIRS criteria.   Environmental Exposure: Unlikely since patient was at her ALF in bed prior to being transported via EMS, unless this occurred during transport.  Hypothyroidism: 2 months ago TSH 1.430. Will re-evaluate with a repeat TSH. Drug overdose: Takes oxycodone at home. Unsure when she last took her Oxycodone. UDS ordered to rule out.  Alcohol intoxication: Denies alcohol use, but will confirm with an ethanol level to rule out. Assessment & Plan Fall Hx of falls. Unwitnessed fall with head strike, without LOC. No signs of active bleeding other than from R knee abrasion. CK wnl. CT head negative for acute intracranial abnormality and fracture. Mild R infraorbital soft tissue swelling noted. CT neck negative for fracture or listhesis but did demonstrate extensive debris noted in the paraesophageal tissues, possible impacted food bolus vs retained debris. Likely due to environmental factors vs vasovagal, however will rule out other etiology with further imaging.  - Admit to FMTS, attending Dr. Manson Passey - Med-Tele, Vital signs per floor - Regular diet, s/p bedside swallow - PT/OT to treat - VTE prophylaxis: Lovenox - Continue antibiotics: CTX for UTI - Echo - Interrogate pacemaker - Orthostatic VS - MRI Brain w/o contrast for concern for possible stroke - Pain: Tylenol q6h PRN, Voltaren gel QID PRN - Avoid sedative medications - Fall precautions Hypothermia T min 94.9 rectal. Put on bair hugger. Differential as above for hypothermia. Etiology unclear at this time, will  - Continue Lawyer and monitor temps - Monitor blood cultures UTI (urinary tract infection) Denies urinary symptoms. UA positive for many bacteria, trace leukocytes. and nitrites. S/p CTX in ED. - Continue CTX - Follow urine culture Esophageal abnormality Seen on imaging, "Extensive debris noted  within the paraesophageal soft tissues at the thoracic inlet versus within the esophageal lumen itself." CXR negative, aspiration risk.  Stroke swallow screen prior to diet and SLP consult.  Knee abrasion See photo below, superficial. Wound care as indicated. Keep clean and dry.   Chronic and Stable Problems:  T2DM: On Lantus 18u daily and SSI with meals. Last A1c 6.0 1 month ago. Monitor CBGs and start insulin as indicated. BMP CBG 107.  HTN: On Losartan 50 mg daily at home. Restart home medications once verified with fax and as pressures allow HLD: Continue Crestor 10 mg daily DDD: Chronic issue. No sign of fracture or listhesis in CT neck. Manage any pain with Tylenol and Voltaren gel at this time.  Peripheral neuropathy: On Gabapentin 100 mg in AM, PM, and 400 mg at bedtime, restart when able to verify medications  Depression: On Cymbalta 60 mg daily  Complete Heart Block with pacemaker: No hx of echo. Will order. PPM interrogation.  Pulmonary nodules: Incidental 3 mm left upper pulmonary nodule found 05/2023 will monitor with low dose CT in 6-12 months, will put in follow up instructions in discharge summary  Sleep Apnea: Not on CPAP Gastroparesis: Hx of partial gastrectomy   FEN/GI: Regular Diet - after stroke swallow screen  VTE Prophylaxis: Lovenox  Disposition: Med-Tele  History of Present Illness:  Judith [name] is a 83 y.o. female presenting from [facility] s/p fall when trying to get out of bed to use the bathroom. The fall was unwitnessed and she remained down on the floor until someone was able to come assist her and call EMS. She reports hitting the right side of her head, but otherwise has no pain or symptoms. She has a history of frequent falls and is not on anticoagulation.  Patient reports this morning she does not remember this episode yesterday.  Denies any dizziness, lightheadedness, LOC, headache, vision changes, sick symptoms (fever, cough, N/V/D), chest pain, SOB, abdominal pain, dysuria, increased frequency, or urgency.   In the ED, patient was hypertensive and hypothermic (T min 94.9 rectal).  Patient was put on a bair hugger. Troponin trended flat at 20 x2. UA revealed many bacteria, trace leukocytes. and nitrites concerning for UTI. LA 1.7. CMP with stable electrolytes, glucose of 107, and Cr 0.90. CBC with Hgb 12.2 and leukocytosis 11.4. PT/INR/APTT 15.8/1.2/29. CT head without abnormality and mild senescent change. CT cervical spine without acute fracture or listhesis however, extensive debris was noted within the paraesophageal soft tissues at the thoracic inlet vs within the esophageal lumen itself, concerning for impacted food bolus or debris. Urine culture and blood cultures collected prior to starting CTX.  Flu and COVID were negative  Review Of Systems: As above  Pertinent Past Medical History: T2DM Depression HTN HLD Pacemaker placed for complete heart block  Peripheral neuropathy DDD Remainder reviewed in history tab.   Pertinent Past Surgical History: Biliary dilatation Cholecystectomy ERCP Partial gastrectomy Pacemaker  Remainder reviewed in history tab.  Pertinent Social History: Tobacco use: None Alcohol use: No Other Substance use: No Lives with roommate at [facility]  Pertinent Family History: Mother: Diabetes Father: Diabetes, heart disease Sister: Diabetes Son: Heart disease Remainder reviewed in history tab.   Important Outpatient Medications: ** Awaiting fax records from [facility] to confirm meds and prior to ordering inpatient ** Tylenol 500 mg q6h PRN Carvedilol 12.5 mg BID Celebrex 50 mg BID Zyrtec 10 mg daily PRN Voltaren gel QID PRN Cymbalta 60 mg daily  Pepcid 20 mg  nightly Lidex-E 0.05% cream - apply to stomach topically q24h at bedtime Flonase 50 mcg/act 2 sprays daily Gabapentin 100 mg in AM, PM, and 400 mg at bedtime Lantus 18u daily with SSI w/ meals Lidocaine patch Losartan 50 mg daily Reglan 10 mg QID PRN Remeron 7.5 mg nightly MVI Zofran 4 mg q6h Oxycodone 5 mg q8h  Miralax 17g daily Crestor 10 mg  daily Vesicare 10 mg daily Remainder reviewed in medication history.   Objective: BP (!) 107/56   Pulse 68   Temp 97.8 F (36.6 C) (Oral)   Resp 11   Ht 5\' 3"  (1.6 m)   Wt 47.5 kg   SpO2 96%   BMI 18.55 kg/m  Exam: General: Thin, pale elderly female in NAD, laying in bed sleepy on exam HEENT: NCAT. Sclera anicteric. Mild swelling around R eye, difficult to open on exam Cardiovascular: RRR. No M/R/G Respiratory: CTAB, normal WOB on RA. No wheezing, crackles, rhonchi, or diminished breath sounds. Abdomen: Soft, non-tender, non-distended. Bowel sounds normoactive Extremities: Able to move all extremities equally. No BLE edema, no deformities or significant joint findings. Skin: Warm and dry. Abrasion over R knee.  Neuro: A&Ox3. No focal neurological deficits. CN II: PERRL CN III, IV,VI: Difficulty opening R eye CVII: Symmetric smile CN VIII: Normal hearing CN IX,X: Symmetric palate raise  CN XII: Symmetric tongue protrusion  UE and LE strength 4/5 Normal sensation in UE and LE bilaterally     Labs:  CBC BMET  Recent Labs  Lab 01/25/24 0535  WBC 10.6*  HGB 10.6*  HCT 33.2*  PLT 326   Recent Labs  Lab 01/25/24 0140  NA 137  K 4.5  CL 106  CO2 20*  BUN 16  CREATININE 0.90  GLUCOSE 107*  CALCIUM 8.7*    Trop: 20>20 LA: 1.7 CK: 74 PT/INR: 15.8/1.2 APTT: 29  EKG:  Regular rate, NSR, No acute ST changes compared to previous, Qtc prolonged (516) -has pacer   Imaging Studies Performed: CXR: Mild right basilar atelectasis.  Pelvis XR: Negative for fracture or dislocation.   CT Head w/o contrast: 1. No acute intracranial abnormality. No calvarial fracture. Mild right infraorbital soft tissue swelling. 2. Mild senescent change.   CT Cervical Spine w/o contrast 1. No acute fracture or listhesis of the cervical spine. 2. Extensive debris noted within the paraesophageal soft tissues at the thoracic inlet versus within the esophageal lumen itself. This can be  seen in the setting of a impacted food bolus within the esophagus or retained debris within and esophageal diverticulum. If indicated, this could be better assessed with endoscopy or nonemergent fluoroscopic esophagram.  Fortunato Curling, DO 01/25/2024, 6:02 AM PGY-1, [facility] Family [facility]  FPTS Intern pager: [phone], text pages welcome Secure chat group [facility] Teaching Service   Upper Level Addendum:  I have seen and evaluated this patient along with Dr. Fatima Blank and reviewed the above note, making necessary revisions as appropriate.  I agree with the medical decision making and physical exam as noted above.  Alfredo Martinez, MD PGY-3 [facility] Family Medicine Residency

## 2024-01-25 NOTE — Evaluation (Signed)
Clinical/Bedside Swallow Evaluation Patient Details  Name: Judith [name] MRN: [id number] Date of Birth: 03/10/1941  Today's Date: 01/25/2024 Time: SLP Start Time (ACUTE ONLY): 6578 SLP Stop Time (ACUTE ONLY): 1009 SLP Time Calculation (min) (ACUTE ONLY): 11 min  Past Medical History:  Past Medical History:  Diagnosis Date   Abdominal pain 06/13/2023   Allergy 1990   Anemia    Arthritis    Biliary gastritis    Blood transfusion without reported diagnosis    2007   Cervical spine arthritis 04/05/2016   Choledocholithiasis    Controlled type 2 diabetes mellitus with diabetic neuropathy, with long-term current use of insulin (HCC)    Depression    Diabetes mellitus type 2 with complications (HCC)    Dilated bile duct    Duodenal ulcer disease    Elevated liver function tests    Encounter for long-term (current) use of insulin (HCC)    Encounter to establish care with new doctor 08/07/2022   Fall 01/11/2023   Gallstones 01/17/2018   Gastric ulcer    Gastritis and gastroduodenitis    Gastroparesis    severe   GERD (gastroesophageal reflux disease)    Gout    tested for gout but was not   Heart murmur    Hiatal hernia    Hyperlipidemia    in past not now   Hypertension    Hypokalemia, gastrointestinal losses    secondary to severe gastroparesis   Internal hemorrhoids    Long-term insulin use in type 2 diabetes (HCC) 04/05/2016   Osteoporosis    just had bone density test possible    Peripheral neuropathy    Postnasal drip 11/01/2022   Presence of cardiac pacemaker    S/P partial gastrectomy    due to severe gastric and gudoenal ulcers   Seasonal allergies    Sleep apnea    could not use CPAP   UTI (urinary tract infection)    Past Surgical History:  Past Surgical History:  Procedure Laterality Date   BILIARY DILATION  02/01/2022   Procedure: BILIARY DILATION;  Surgeon: Lemar Lofty., MD;  Location: [facility] ENDOSCOPY;  Service: Gastroenterology;;   BILIARY  DILATION  04/09/2022   Procedure: BILIARY DILATION;  Surgeon: Lemar Lofty., MD;  Location: [facility] ENDOSCOPY;  Service: Gastroenterology;;   BILIARY DILATION  06/13/2023   Procedure: BILIARY DILATION;  Surgeon: Lemar Lofty., MD;  Location: [facility] ENDOSCOPY;  Service: Gastroenterology;;   BILIARY STENT PLACEMENT N/A 11/30/2021   Procedure: BILIARY STENT PLACEMENT;  Surgeon: Lemar Lofty., MD;  Location: [facility] ENDOSCOPY;  Service: Gastroenterology;  Laterality: N/A;   BILIARY STENT PLACEMENT  02/01/2022   Procedure: BILIARY STENT PLACEMENT;  Surgeon: Meridee Score Netty Starring., MD;  Location: [facility] ENDOSCOPY;  Service: Gastroenterology;;   BIOPSY  11/30/2021   Procedure: BIOPSY;  Surgeon: Lemar Lofty., MD;  Location: [facility] ENDOSCOPY;  Service: Gastroenterology;;   BIOPSY  04/09/2022   Procedure: BIOPSY;  Surgeon: Lemar Lofty., MD;  Location: [facility] ENDOSCOPY;  Service: Gastroenterology;;   CHOLECYSTECTOMY N/A 12/06/2021   Procedure: LAPAROSCOPIC CHOLECYSTECTOMY, LYSIS OF ADHESIONS;  Surgeon: Harriette Bouillon, MD;  Location: [facility] ORS;  Service: General;  Laterality: N/A;   COLONOSCOPY  02/11/2018   no polyps, + small internal hemorrhoids   ENDOSCOPIC RETROGRADE CHOLANGIOPANCREATOGRAPHY (ERCP) WITH PROPOFOL N/A 11/30/2021   Procedure: ENDOSCOPIC RETROGRADE CHOLANGIOPANCREATOGRAPHY (ERCP) WITH PROPOFOL;  Surgeon: Lemar Lofty., MD;  Location: [facility] ENDOSCOPY;  Service: Gastroenterology;  Laterality: N/A;   ENDOSCOPIC RETROGRADE CHOLANGIOPANCREATOGRAPHY (ERCP) WITH  PROPOFOL N/A 02/01/2022   Procedure: ENDOSCOPIC RETROGRADE CHOLANGIOPANCREATOGRAPHY (ERCP) WITH PROPOFOL;  Surgeon: Meridee Score Netty Starring., MD;  Location: [facility] ENDOSCOPY;  Service: Gastroenterology;  Laterality: N/A;   ENDOSCOPIC RETROGRADE CHOLANGIOPANCREATOGRAPHY (ERCP) WITH PROPOFOL N/A 04/09/2022   Procedure: ENDOSCOPIC RETROGRADE CHOLANGIOPANCREATOGRAPHY (ERCP) WITH PROPOFOL;  Surgeon: Meridee Score  Netty Starring., MD;  Location: [facility] ENDOSCOPY;  Service: Gastroenterology;  Laterality: N/A;   ENDOSCOPIC RETROGRADE CHOLANGIOPANCREATOGRAPHY (ERCP) WITH PROPOFOL N/A 06/13/2023   Procedure: ENDOSCOPIC RETROGRADE CHOLANGIOPANCREATOGRAPHY (ERCP) WITH PROPOFOL;  Surgeon: Meridee Score Netty Starring., MD;  Location: [facility] ENDOSCOPY;  Service: Gastroenterology;  Laterality: N/A;   ESOPHAGOGASTRODUODENOSCOPY (EGD) WITH PROPOFOL N/A 11/30/2021   Procedure: ESOPHAGOGASTRODUODENOSCOPY (EGD) WITH PROPOFOL;  Surgeon: Meridee Score Netty Starring., MD;  Location: [facility] ENDOSCOPY;  Service: Gastroenterology;  Laterality: N/A;   EUS N/A 12/12/2017   Procedure: UPPER ENDOSCOPIC ULTRASOUND (EUS) RADIAL;  Surgeon: Rachael Fee, MD;  Location: [facility] ENDOSCOPY;  Service: Endoscopy;  Laterality: N/A;   EYE SURGERY  2024   GASTRECTOMY     JOINT REPLACEMENT     pace maker     PARTIAL GASTRECTOMY     PPM GENERATOR CHANGEOUT N/A 04/25/2023   Procedure: PPM GENERATOR CHANGEOUT;  Surgeon: Thurmon Fair, MD;  Location: [facility] INVASIVE CV LAB;  Service: Cardiovascular;  Laterality: N/A;   REMOVAL OF STONES  02/01/2022   Procedure: REMOVAL OF STONES;  Surgeon: Meridee Score Netty Starring., MD;  Location: [facility] ENDOSCOPY;  Service: Gastroenterology;;   REMOVAL OF STONES  04/09/2022   Procedure: REMOVAL OF STONES;  Surgeon: Lemar Lofty., MD;  Location: [facility] ENDOSCOPY;  Service: Gastroenterology;;   REMOVAL OF STONES  06/13/2023   Procedure: REMOVAL OF STONES;  Surgeon: Lemar Lofty., MD;  Location: [facility] ENDOSCOPY;  Service: Gastroenterology;;   Dennison Mascot  11/30/2021   Procedure: Dennison Mascot;  Surgeon: Lemar Lofty., MD;  Location: [facility] ENDOSCOPY;  Service: Gastroenterology;;   Francine Graven REMOVAL  02/01/2022   Procedure: STENT REMOVAL;  Surgeon: Lemar Lofty., MD;  Location: [facility] ENDOSCOPY;  Service: Gastroenterology;;   Francine Graven REMOVAL  04/09/2022   Procedure: STENT REMOVAL;  Surgeon: Lemar Lofty., MD;   Location: [facility] ENDOSCOPY;  Service: Gastroenterology;;   SUBMUCOSAL TATTOO INJECTION  11/30/2021   Procedure: SUBMUCOSAL TATTOO INJECTION;  Surgeon: Lemar Lofty., MD;  Location: [facility] ENDOSCOPY;  Service: Gastroenterology;;   TONSILLECTOMY     45 years ago   UPPER GASTROINTESTINAL ENDOSCOPY     HPI:  Pt is a 83 y.o. female presented to ED with fall. Found to be hypothermic and placed on a bair hugger. Found to have UTI and started on CTX. CT head (01/25/24)- neg. CT Cervical Spine revealed, "Extensive debris noted within the paraesophageal soft tissues at the thoracic inlet versus within the esophageal lumen itself. This can be seen in the setting of a impacted food bolus within the esophagus or retained debris within and esophageal diverticulum. If indicated, this could be better assessed with endoscopy or nonemergent fluoroscopic esophagram". PMH: T2DM, multilevel DDD, peripheral neuropathy, depression, unintentional weight loss, and CAD s/p CABG and pacemaker, complete heart block s/p pacemaker.    Assessment / Plan / Recommendation  Clinical Impression  Pt presents without s/sx of aspiration or observable oral deficits with solid and liquid PO trials. At start of eval, pt reported that she sometimes gets choked on vinegar, but no incidences were noted with consecutive swallows of thin liquids by straw. Self administered, large spoonfuls of puree and bites of regular solid were swiftly cleared from oral cavity with palpable swallow noted.  RN reports pt taking meds whole with water without difficulty this am. Continue on regular diet/thin liquids with universal swallow precautions. No further SLP services indicated at this time.  SLP Visit Diagnosis: Dysphagia, unspecified (R13.10)    Aspiration Risk  No limitations    Diet Recommendation Regular;Thin liquid    Liquid Administration via: Cup;Straw Medication Administration: Whole meds with liquid Supervision: Patient able to self  feed Compensations: Slow rate;Small sips/bites Postural Changes: Seated upright at 90 degrees    Other  Recommendations Oral Care Recommendations: Oral care BID    Recommendations for follow up therapy are one component of a multi-disciplinary discharge planning process, led by the attending physician.  Recommendations may be updated based on patient status, additional functional criteria and insurance authorization.  Follow up Recommendations No SLP follow up      Assistance Recommended at Discharge    Functional Status Assessment Patient has not had a recent decline in their functional status  Frequency and Duration            Prognosis        Swallow Study   General Date of Onset: 01/25/24 HPI: Pt is a 83 y.o. female presented to ED with fall. Found to be hypothermic and placed on a bair hugger. Found to have UTI and started on CTX. CT head (01/25/24)- neg. CT Cervical Spine revealed, "Extensive debris noted within the paraesophageal soft tissues at the thoracic inlet versus within the esophageal lumen itself. This can be seen in the setting of a impacted food bolus within the esophagus or retained debris within and esophageal diverticulum. If indicated, this could be better assessed with endoscopy or nonemergent fluoroscopic esophagram". PMH: T2DM, multilevel DDD, peripheral neuropathy, depression, unintentional weight loss, and CAD s/p CABG and pacemaker, complete heart block s/p pacemaker. Type of Study: Bedside Swallow Evaluation Previous Swallow Assessment: none per EMR Diet Prior to this Study: Regular;Thin liquids (Level 0) Temperature Spikes Noted: No Respiratory Status: Room air History of Recent Intubation: No Behavior/Cognition: Alert;Pleasant mood;Cooperative Oral Cavity Assessment: Within Functional Limits Oral Cavity - Dentition: Missing dentition Vision: Functional for self-feeding Self-Feeding Abilities: Able to feed self Patient Positioning: Upright in  bed Baseline Vocal Quality: Normal Volitional Cough: Weak Volitional Swallow: Able to elicit    Oral/Motor/Sensory Function Overall Oral Motor/Sensory Function: Within functional limits   Ice Chips Ice chips: Not tested   Thin Liquid Thin Liquid: Within functional limits Presentation: Self Fed;Straw    Nectar Thick Nectar Thick Liquid: Not tested   Honey Thick Honey Thick Liquid: Not tested   Puree Puree: Within functional limits Presentation: Self Fed;Spoon   Solid     Solid: Within functional limits Presentation: Self Fed       Avie Echevaria, MA, CCC-SLP Acute Rehabilitation Services Office Number: [phone]  Paulette Blanch 01/25/2024,10:36 AM

## 2024-01-25 NOTE — ED Provider Notes (Signed)
[facility]PT [facility] Emergency Department Provider Note MRN:  [id number]  Arrival date & time: 01/25/24     Chief Complaint   Fall   History of Present Illness   Judith [name] is a 83 y.o. year-old female presents to the ED with chief complaint of fall.  She is brought in by EMS from [place] nursing facility for an unwitnessed fall.  It is unclear how long she was down for.  Patient states that she does not remember why she fell.  She does not complain of any pain.  She has history of frequent falls.  No documented anticoagulation.  History provided by patient.   Review of Systems  Pertinent positive and negative review of systems noted in HPI.    Physical Exam   Vitals:   01/25/24 0315 01/25/24 0332  BP: (!) 127/53   Pulse: 62   Resp: 14   Temp:  (!) 95.4 F (35.2 C)  SpO2: 98%     CONSTITUTIONAL:  non toxic-appearing, NAD NEURO:  Alert to person EYES:  eyes equal and reactive ENT/NECK:  Supple, no stridor  CARDIO:  normal rate, regular rhythm, appears well-perfused  PULM:  No respiratory distress, CTAB GI/GU:  non-distended,  MSK/SPINE:  No gross deformities, no edema, moves all extremities  SKIN:  no rash, atraumatic   *Additional and/or pertinent findings included in MDM below  Diagnostic and Interventional Summary    EKG Interpretation Date/Time:  Saturday January 25 2024 01:19:44 EST Ventricular Rate:  64 PR Interval:  186 QRS Duration:  173 QT Interval:  500 QTC Calculation: 516 R Axis:   -84  Text Interpretation: Sinus rhythm Nonspecific IVCD with LAD Left ventricular hypertrophy I Confirmed by Judith [name] ([id number]) on 01/25/2024 1:32:18 AM       Labs Reviewed  COMPREHENSIVE METABOLIC PANEL - Abnormal; Notable for the following components:      Result Value   CO2 20 (*)    Glucose, Bld 107 (*)    Calcium 8.7 (*)    Total Protein 5.2 (*)    Albumin 2.7 (*)    All other components within normal limits  CBC WITH  DIFFERENTIAL/PLATELET - Abnormal; Notable for the following components:   WBC 11.4 (*)    RBC 3.66 (*)    MCV 105.7 (*)    Neutro Abs 9.1 (*)    All other components within normal limits  PROTIME-INR - Abnormal; Notable for the following components:   Prothrombin Time 15.8 (*)    All other components within normal limits  URINALYSIS, W/ REFLEX TO CULTURE (INFECTION SUSPECTED) - Abnormal; Notable for the following components:   Nitrite POSITIVE (*)    Leukocytes,Ua TRACE (*)    Bacteria, UA MANY (*)    All other components within normal limits  CBG MONITORING, ED - Abnormal; Notable for the following components:   Glucose-Capillary 121 (*)    All other components within normal limits  TROPONIN I (HIGH SENSITIVITY) - Abnormal; Notable for the following components:   Troponin I (High Sensitivity) 20 (*)    All other components within normal limits  TROPONIN I (HIGH SENSITIVITY) - Abnormal; Notable for the following components:   Troponin I (High Sensitivity) 20 (*)    All other components within normal limits  CULTURE, BLOOD (ROUTINE X 2)  CULTURE, BLOOD (ROUTINE X 2)  URINE CULTURE  APTT  CK  I-STAT CG4 LACTIC ACID, ED    CT HEAD WO CONTRAST ( )  Final Result  CT Cervical Spine Wo Contrast  Final Result    DG Pelvis Portable  Final Result    DG Chest Port 1 View  Final Result      Medications  cefTRIAXone (ROCEPHIN) 1 g in sodium chloride 0.9 % 100 mL IVPB (1 g Intravenous New Bag/Given 01/25/24 0334)     Procedures  /  Critical Care Procedures  ED Course and Medical Decision Making  I have reviewed the triage vital signs, the nursing notes, and pertinent available records from the EMR.  Social Determinants Affecting Complexity of Care: Patient has no clinically significant social determinants affecting this chief complaint..   ED Course: Clinical Course as of 01/25/24 0431  Sat Jan 25, 2024  0430 Urinalysis, w/ Reflex to Culture (Infection Suspected)  -Urine, Clean Catch(!) Worrisome for infection, will treat with rocephin [RB]  0430 I-Stat Lactic Acid, ED [RB]  0431 CBC with Differential(!) Mild leukocytosis and left shift, getting antibiotics [RB]    Clinical Course User Index [RB] [name], PA-C    Medical Decision Making Patient here with fall.  From SNF.  Hx of frequent falls.  She is noted to by hypothermic by rectal temp.  Will give bair hugger and check labs and reassess.  No intracranial injury seen on CT of head.  CT cervical spine notes questionable debris/food bolus/diverticulum.  Not vomiting.    Problems Addressed: Acute cystitis without hematuria: acute illness or injury    Details: Will treat with IV rocephin.  She is not hypotensive nor tachycardic.  Normal lactic, so decision made not to get weight based fluids. Frequent falls: chronic illness or injury Hypothermia, initial encounter: undiagnosed new problem with uncertain prognosis  Amount and/or Complexity of Data Reviewed Labs: ordered. Decision-making details documented in ED Course. Radiology: ordered.  Risk Decision regarding hospitalization.         Consultants: I consulted with Family Practice Residency team, who is appreciated for admitting.   Treatment and Plan: Patient's exam and diagnostic results are concerning for UTI, recurrent falls, hypothermia.  Feel that patient will need admission to the hospital for further treatment and evaluation.  Patient seen by and discussed with attending physician, Dr. Bebe Shaggy, who agrees with plan for admission.  Final Clinical Impressions(s) / ED Diagnoses     ICD-10-CM   1. Acute cystitis without hematuria  N30.00     2. Hypothermia, initial encounter  T68.XXXA     3. Frequent falls  R29.6       ED Discharge Orders     None         Discharge Instructions Discussed with and Provided to Patient:   Discharge Instructions   None      [name], PA-C 01/25/24 1610     Judith Rhine, MD 01/25/24 [phone]

## 2024-01-25 NOTE — Evaluation (Signed)
Physical Therapy Evaluation Patient Details Name: Judith [name] MRN: [id number] DOB: 1941/02/28 Today's Date: 01/25/2024  History of Present Illness  Pt is 83 yo F who presented to ED after a fall at her ALF. Found to have UTI and hypothermic. PMH includes: T2DM, multilevel DDD, peripheral neuropathy, depression, unintentional weight loss, and CAD s/p CABG and pacemaker, complete heart block.  Clinical Impression  PTA, pt lives at an ALF, uses Rollator for mobility and requires assist for ADL's. Pt presents with decreased functional mobility secondary to generalized weakness, impaired standing balance and decreased activity tolerance. Pt requiring increased assist to maneuver to side of stretcher, but able to stand up to RW without physical assist. Pt defers ambulation attempt due to fatigue and requests return to supine. Will continue to follow acutely to progress mobility as tolerated. Will benefit from HHPT at ALF to address deficits and maximize functional mobility.      If plan is discharge home, recommend the following: A little help with walking and/or transfers;A little help with bathing/dressing/bathroom;Assistance with cooking/housework;Assist for transportation;Help with stairs or ramp for entrance;Direct supervision/assist for medications management   Can travel by private vehicle        Equipment Recommendations None recommended by PT  Recommendations for Other Services       Functional Status Assessment Patient has had a recent decline in their functional status and demonstrates the ability to make significant improvements in function in a reasonable and predictable amount of time.     Precautions / Restrictions Precautions Precautions: Fall Restrictions Weight Bearing Restrictions Per Provider Order: No      Mobility  Bed Mobility Overal bed mobility: Needs Assistance Bed Mobility: Supine to Sit, Sit to Supine     Supine to sit: Mod assist, HOB elevated Sit to  supine: Mod assist   General bed mobility comments: multimodal cueing, cues for sequencing at times. physical assist to advance BLE and to pivot hips to full EOB position from stretcher    Transfers Overall transfer level: Needs assistance Equipment used: Rolling walker (2 wheels) Transfers: Sit to/from Stand Sit to Stand: Contact guard assist           General transfer comment: Pt able to stand up from stretcher without physical assist. CGA for safety    Ambulation/Gait                  Stairs            Wheelchair Mobility     Tilt Bed    Modified Rankin (Stroke Patients Only)       Balance Overall balance assessment: History of Falls, Needs assistance Sitting-balance support: Bilateral upper extremity supported, Feet unsupported (ER stretcher pt able to touch floor only with R foot d/c mechanics of the space) Sitting balance-Leahy Scale: Fair     Standing balance support: Bilateral upper extremity supported Standing balance-Leahy Scale: Poor Standing balance comment: RW                             Pertinent Vitals/Pain Pain Assessment Pain Assessment: Faces Faces Pain Scale: No hurt    Home Living Family/patient expects to be discharged to:: Assisted living                 Home Equipment: Rollator (4 wheels);Shower seat - built in;Toilet riser;Grab bars - toilet;Grab bars - tub/shower Additional Comments: In the past 6 mos pt has been needing increased assist with  bathing/dressing. H/o falls walking to the bathroom, daughter reports very short distance to bathroom. Daughter provided home setup/PLOF data.    Prior Function Prior Level of Function : Needs assist       Physical Assist : ADLs (physical);Mobility (physical) Mobility (physical): Transfers;Gait ADLs (physical): Bathing;Dressing;IADLs Mobility Comments: rollator to bathroom and does go down for meals in the dining hall some. ADLs Comments: in the past 6 mos has  begun needing physical assist with bathing/dressing.     Extremity/Trunk Assessment   Upper Extremity Assessment Upper Extremity Assessment: Generalized weakness    Lower Extremity Assessment Lower Extremity Assessment: Generalized weakness    Cervical / Trunk Assessment Cervical / Trunk Assessment: Other exceptions Cervical / Trunk Exceptions: daughter reports pt has recently been having difficulty with neck extension. tends to sit in cervical/trunk flexion.  Communication   Communication Communication:  (slow processing)  Cognition Arousal: Alert Behavior During Therapy: Flat affect Overall Cognitive Status: Within Functional Limits for tasks assessed                                 General Comments: Some confusion and slow processing, daughter reports she is close to baseline.        General Comments General comments (skin integrity, edema, etc.): pt verbalized fear of falling. Daughter who is HCPOA present throughout session and provided PLOF and home setup.    Exercises     Assessment/Plan    PT Assessment Patient needs continued PT services  PT Problem List Decreased strength;Decreased activity tolerance;Decreased balance;Decreased mobility;Decreased cognition;Decreased safety awareness       PT Treatment Interventions DME instruction;Gait training;Functional mobility training;Therapeutic activities;Therapeutic exercise;Balance training;Patient/family education    PT Goals (Current goals can be found in the Care Plan section)  Acute Rehab PT Goals Patient Stated Goal: did not state PT Goal Formulation: With patient Time For Goal Achievement: 02/08/24 Potential to Achieve Goals: Good    Frequency Min 1X/week     Co-evaluation               AM-PAC PT "6 Clicks" Mobility  Outcome Measure Help needed turning from your back to your side while in a flat bed without using bedrails?: A Little Help needed moving from lying on your back to  sitting on the side of a flat bed without using bedrails?: A Lot Help needed moving to and from a bed to a chair (including a wheelchair)?: None Help needed standing up from a chair using your arms (e.g., wheelchair or bedside chair)?: None Help needed to walk in hospital room?: Total Help needed climbing 3-5 steps with a railing? : Total 6 Click Score: 15    End of Session Equipment Utilized During Treatment: Gait belt Activity Tolerance: Patient limited by fatigue Patient left: in bed;with call bell/phone within reach   PT Visit Diagnosis: Unsteadiness on feet (R26.81);History of falling (Z91.81)    Time: 1610-9604 PT Time Calculation (min) (ACUTE ONLY): 16 min   Charges:   PT Evaluation $PT Eval Low Complexity: 1 Low   PT General Charges $$ ACUTE PT VISIT: 1 Visit         Lillia Pauls, PT, DPT Acute Rehabilitation Services Office [phone]   Norval Morton 01/25/2024, 3:51 PM

## 2024-01-25 NOTE — ED Triage Notes (Signed)
Patient bib ems from [facility] for unwitnessed fall. Unknown downtime. Patient states she remembers fall but unsure of why she fell. Swelling noted to left eyebrow. No other injuries noted or reported. Facility reported to EMS that patient was on a blood thinner but could not find in their record if she is on a blood thinner. CBG 67 with ems, 15g oral glucose given - repeat cbg 82

## 2024-01-25 NOTE — ED Notes (Signed)
MD notified of core temperature. Patient moved to room and bair hugger applied.

## 2024-01-25 NOTE — Assessment & Plan Note (Addendum)
See photo below, superficial. Wound care as indicated. Keep clean and dry.

## 2024-01-25 NOTE — ED Notes (Signed)
RN notifed of vitals

## 2024-01-25 NOTE — Assessment & Plan Note (Addendum)
Seen on imaging, "Extensive debris noted within the paraesophageal soft tissues at the thoracic inlet versus within the esophageal lumen itself." CXR negative, aspiration risk. Stroke swallow screen prior to diet and SLP consult.

## 2024-01-25 NOTE — Progress Notes (Signed)
01/25/24 1200  OT Visit Information  Last OT Received On 01/25/24  Assistance Needed +2  History of Present Illness Pt is 83 yo F who presented to ED after a fall at her ALF. Found to have UTI and hypothermic. PMH includes: T2DM, multilevel DDD, peripheral neuropathy, depression, unintentional weight loss, and CAD s/p CABG and pacemaker, complete heart block.  Precautions  Precautions Fall  Restrictions  Weight Bearing Restrictions Per Provider Order No  Home Living  Family/patient expects to be discharged to: Assisted living  Home Equipment Rollator (4 wheels);Shower seat - built in;Toilet riser;Grab bars - toilet;Grab bars - tub/shower  Additional Comments In the past 6 mos pt has been needing increased assist with bathing/dressing. H/o falls walking to the bathroom, daughter reports very short distance to bathroom. Daughter provided home setup/PLOF data.  Prior Function  Prior Level of Function  Needs assist  Physical Assist  ADLs (physical);Mobility (physical)  Mobility (physical) Transfers;Gait  ADLs (physical) Bathing;Dressing;IADLs  Mobility Comments rollator to bathroom and does go down for meals in the dining hall some.  ADLs Comments in the past 6 mos has begun needing physical assist with bathing/dressing.  Pain Assessment  Pain Assessment Faces  Faces Pain Scale 0  Cognition  Arousal Alert  Behavior During Therapy Flat affect  Overall Cognitive Status Within Functional Limits for tasks assessed  General Comments Some confusion and slow processing, daughter reports she is close to baseline.  Communication  Communication  (slow processing)  Upper Extremity Assessment  Upper Extremity Assessment Generalized weakness  Lower Extremity Assessment  Lower Extremity Assessment Defer to PT evaluation  Cervical / Trunk Assessment  Cervical / Trunk Assessment Other exceptions  Cervical / Trunk Exceptions daughter reports pt has recently been having difficulty with neck  extension. tends to sit in cervical/trunk flexion.  ADL  Overall ADL's  Needs assistance/impaired  Eating/Feeding Set up  Grooming Minimal assistance;Sitting  Upper Body Bathing Sitting;Moderate assistance  Lower Body Bathing Maximal assistance;Sitting/lateral leans;Sit to/from stand  Upper Body Dressing  Moderate assistance;Sitting  Lower Body Dressing Maximal assistance;Moderate assistance;Sitting/lateral leans;Sit to/from stand  General ADL Comments Completed bed mobility and sat EOB a few minutes. +2 for safety to attempt standing. rollator at baseline, h/o falls during bathroom trips at home.  Bed Mobility  Overal bed mobility Needs Assistance  Bed Mobility Supine to Sit;Sit to Supine  Supine to sit Mod assist;HOB elevated  Sit to supine Mod assist  General bed mobility comments multimodal cueing, cues for sequencing at times. physical assist to advance BLE and to pivot hips to full EOB position  Transfers  General transfer comment did not attempt as pt will need +2 at least for safety to attempt standing  Balance  Overall balance assessment History of Falls;Needs assistance  Sitting-balance support Bilateral upper extremity supported;Feet unsupported (ER stretcher pt able to touch floor only with R foot d/c mechanics of the space)  Sitting balance-Leahy Scale Fair  General Comments  General comments (skin integrity, edema, etc.) pt verbalized fear of falling. Daughter who is HCPOA present throughout session and provided PLOF and home setup.  OT - End of Session  Activity Tolerance Patient limited by fatigue  Patient left in bed;with call bell/phone within reach;with family/visitor present (ER stretcher)  Nurse Communication Mobility status  OT Assessment  OT Recommendation/Assessment Patient needs continued OT Services  OT Visit Diagnosis Unsteadiness on feet (R26.81);Other abnormalities of gait and mobility (R26.89);History of falling (Z91.81);Muscle weakness (generalized)  (M62.81);Other symptoms and signs involving cognitive function;Adult,  failure to thrive (R62.7)  OT Problem List Decreased strength;Decreased activity tolerance;Impaired balance (sitting and/or standing);Decreased cognition;Decreased knowledge of use of DME or AE;Decreased knowledge of precautions  OT Plan  OT Frequency (ACUTE ONLY) Min 2X/week  OT Treatment/Interventions (ACUTE ONLY) Self-care/ADL training;Therapeutic exercise;Energy conservation;DME and/or AE instruction;Therapeutic activities;Patient/family education;Balance training  AM-PAC OT "6 Clicks" Daily Activity Outcome Measure (Version 2)  Help from another person eating meals? 4  Help from another person taking care of personal grooming? 3  Help from another person toileting, which includes using toliet, bedpan, or urinal? 2  Help from another person bathing (including washing, rinsing, drying)? 2  Help from another person to put on and taking off regular upper body clothing? 3  Help from another person to put on and taking off regular lower body clothing? 2  6 Click Score 16  Progressive Mobility  What is the highest level of mobility based on the progressive mobility assessment? Level 2 (Chairfast) - Balance while sitting on edge of bed and cannot stand  Activity Dangled on edge of bed  OT Recommendation  Recommendations for Other Services PT consult  Follow Up Recommendations Home health OT  Patient can return home with the following A lot of help with bathing/dressing/bathroom;A lot of help with walking and/or transfers;Assistance with cooking/housework;Direct supervision/assist for medications management;Direct supervision/assist for financial management;Assist for transportation;Supervision due to cognitive status  Functional Status Assessent Patient has had a recent decline in their functional status and demonstrates the ability to make significant improvements in function in a reasonable and predictable amount of time.  OT  Equipment Wheelchair (measurements OT);Wheelchair cushion (measurements OT)  Individuals Consulted  Consulted and Agree with Results and Recommendations Family member/caregiver;Patient  Family Member Consulted daughter present  Acute Rehab OT Goals  OT Goal Formulation With patient/family  Time For Goal Achievement 02/08/24  Potential to Achieve Goals Good  OT Time Calculation  OT Start Time (ACUTE ONLY) 1059  OT Stop Time (ACUTE ONLY) 1121  OT Time Calculation (min) 22 min  OT General Charges  $OT Visit 1 Visit  OT Evaluation  $OT Eval Moderate Complexity 1 Mod   Pt admitted with the above diagnoses and presents with below problem list. Pt will benefit from continued acute OT to address the below listed deficits and maximize independence with basic ADLs prior to d/c. PTA, pt has been needing increased assistance with bathing/dressing which ALF and daughter have been providing, uses a rollator. Pt currently needs mod A with UB/LB ADLs, +2 for safety to attempt standing. Daughter reports h/o falls mostly while pt has been on her way to the bathroom in her room; she still walks to dining hall some as well.   Raynald Kemp, OT Acute Rehabilitation Services Office: [phone]

## 2024-01-25 NOTE — Assessment & Plan Note (Addendum)
T min 94.9 rectal. Put on bair hugger. Differential as above for hypothermia. Etiology unclear at this time, will  - Continue Lawyer and monitor temps - Monitor blood cultures

## 2024-01-26 DIAGNOSIS — R296 Repeated falls: Secondary | ICD-10-CM | POA: Diagnosis not present

## 2024-01-26 LAB — GLUCOSE, CAPILLARY
Glucose-Capillary: 177 mg/dL — ABNORMAL HIGH (ref 70–99)
Glucose-Capillary: 232 mg/dL — ABNORMAL HIGH (ref 70–99)
Glucose-Capillary: 186 mg/dL — ABNORMAL HIGH (ref 70–99)
Glucose-Capillary: 118 mg/dL — ABNORMAL HIGH (ref 70–99)

## 2024-01-26 LAB — CBC
HCT: 37.4 % (ref 36.0–46.0)
Hemoglobin: 12.2 g/dL (ref 12.0–15.0)
MCH: 32.8 pg (ref 26.0–34.0)
MCHC: 32.6 g/dL (ref 30.0–36.0)
MCV: 100.5 fL — ABNORMAL HIGH (ref 80.0–100.0)
Platelets: 368 10*3/uL (ref 150–400)
RBC: 3.72 MIL/uL — ABNORMAL LOW (ref 3.87–5.11)
RDW: 14.1 % (ref 11.5–15.5)
WBC: 13 10*3/uL — ABNORMAL HIGH (ref 4.0–10.5)
nRBC: 0 % (ref 0.0–0.2)

## 2024-01-26 LAB — CORTISOL-AM, BLOOD: Cortisol - AM: 18.8 ug/dL (ref 6.7–22.6)

## 2024-01-26 MED ORDER — QUETIAPINE 12.5 MG HALF TABLET
25.0000 mg | ORAL_TABLET | Freq: Every day | ORAL | Status: AC
  Administered 2024-01-26: 25 mg via ORAL
  Filled 2024-01-26: qty 2

## 2024-01-26 MED ORDER — INSULIN ASPART 100 UNIT/ML IJ SOLN: 0.0000 [IU] | Freq: Every day | INTRAMUSCULAR | Status: DC

## 2024-01-26 MED ORDER — INSULIN ASPART 100 UNIT/ML IJ SOLN
0.0000 [IU] | Freq: Three times a day (TID) | INTRAMUSCULAR | Status: DC
  Administered 2024-01-26: 3 [IU] via SUBCUTANEOUS
  Administered 2024-01-26: 5 [IU] via SUBCUTANEOUS
  Administered 2024-01-27: 2 [IU] via SUBCUTANEOUS
  Administered 2024-01-27: 3 [IU] via SUBCUTANEOUS
  Administered 2024-01-28: 5 [IU] via SUBCUTANEOUS
  Administered 2024-01-28 – 2024-01-29 (×3): 3 [IU] via SUBCUTANEOUS
  Administered 2024-01-29 – 2024-01-30 (×4): 5 [IU] via SUBCUTANEOUS

## 2024-01-26 NOTE — Assessment & Plan Note (Addendum)
Resolved- Possibly in the setting of UTI.  T min 94.9 rectal.  Temp now improving off Bair hugger.  97.74F this morning.  A.m. cortisol 18.8 consistent with normal adrenal function. -Continue to monitor temp - Monitor blood cultures, NGTD -Treatment of UTI as noted

## 2024-01-26 NOTE — Progress Notes (Addendum)
Daily Progress Note Intern Pager: [phone]  Patient name: Judith [name] Medical record number: [id number] Date of birth: August 23, 1941 Age: 83 y.o. Gender: female  Primary Care Provider: Darral Dash, DO Consultants: None Code Status: Full code  Pt Overview and Major Events to Date:  2/1 admitted  Assessment and Plan:  Judith [name] is a 83 y.o. female presenting with fall, found to be hypothermic in the setting of likely UTI.   Pertinent PMH/PSH includes T2DM, degenerative disease, peripheral neuropathy, depression, unintentional weight loss.  Assessment & Plan Fall Unclear etiology.  Does have a Hx of falls. CT head negative for acute intracranial abnormality and fracture. Mild R infraorbital soft tissue swelling noted. CT neck negative for fracture or listhesis but did demonstrate extensive debris noted in the paraesophageal tissues, possible impacted food bolus vs retained debris.  Echo with EF 50-55%.  Feel that TIA is possible although seems less likely and reassuringly has no residual neuro deficits on exam. -Plan for MRI brain without contrast, likely 2/3.  Patient has a pacemaker - PT/OT/SLP - Interrogate pacemaker - Orthostatic VS - Pain: Tylenol q6h PRN, Voltaren gel QID PRN - Avoid sedative medications - Fall precautions Hypothermia Resolved- Possibly in the setting of UTI.  T min 94.9 rectal.  Temp now improving off Bair hugger.  97.39F this morning.  A.m. cortisol 18.8 consistent with normal adrenal function. -Continue to monitor temp - Monitor blood cultures, NGTD -Treatment of UTI as noted UTI (urinary tract infection) UA with leuks, nitrites, bacteria.  Urine culture 1/30 showed pansensitive E. coli.  Repeat culture pending.  Received ceftriaxone on admission. - Duricef BID - Follow urine culture Esophageal abnormality Seen on imaging, "Extensive debris noted within the paraesophageal soft tissues at the thoracic inlet versus within the esophageal lumen  itself." CXR negative, aspiration risk.  -SLP consulted Knee abrasion  Wound care as indicated. Keep clean and dry.    Chronic and Stable Issues: T2DM: mSSI HTN: On Losartan 50 mg daily at home. Restart home medications once verified with fax from facility and as pressures allow HLD: Continue Crestor 10 mg daily DDD: Chronic issue. No sign of fracture or listhesis in CT neck. Manage any pain with Tylenol and Voltaren gel at this time.  Peripheral neuropathy: On Gabapentin 100 mg in AM, PM, and 400 mg at bedtime, restart when able to verify medications  Depression: On Cymbalta 60 mg daily  Complete Heart Block with pacemaker: PPM interrogation.  Pulmonary nodules: Incidental 3 mm left upper pulmonary nodule found 05/2023 will need repeat low dose CT in 6-12 months, will put in follow up instructions in discharge summary  Sleep Apnea: Not on CPAP Gastroparesis: Hx of partial gastrectomy   FEN/GI: reg diet PPx: Lovenox Dispo:Home with home health pending continued evaluation  Subjective:  NAEON.  Feeling a bit nauseous. Denies SOB, CP, abd pain.  Spoke with daughter at bedside who is her POA.  Daughter feels that the patient has had a decline in her mental status over the past week or 2.  Seems more out of it/foggy and less active compared to her baseline.  Also reports that the patient was complaining of some right-sided weakness last week.  Objective: Temp:  [97 F (36.1 C)-98.4 F (36.9 C)] 97.4 F (36.3 C) (02/02 0435) Pulse Rate:  [72-89] 72 (02/02 0435) Resp:  [12-22] 14 (02/02 0435) BP: (142-164)/(59-99) 158/82 (02/02 0435) SpO2:  [91 %-100 %] 98 % (02/02 0435) Weight:  [46.5 kg] 46.5 kg (  02/01 1600) Physical Exam: General: NAD sitting up in chair Cardiovascular: RRR, no murmurs Respiratory: CTAB normal WOB on RA Abdomen: Soft NT ND Extremities: No significant edema Neuro: PERRLA, EOMI.  Cranial nerves II through XII intact without focal deficit.  Sensation intact and  equal in bilateral upper and lower extremities.  Strength 5/5 in bilateral upper and lower extremities.  Normal heel-shin and finger-nose testing bilaterally.  Alert, able to state her name and why she is in the hospital, states that she had a fall.  States that she is in [place].  States the year is 6.  Laboratory: Most recent CBC Lab Results  Component Value Date   WBC 13.0 (H) 01/26/2024   HGB 12.2 01/26/2024   HCT 37.4 01/26/2024   MCV 100.5 (H) 01/26/2024   PLT 368 01/26/2024   Most recent BMP    Latest Ref Rng & Units 01/25/2024    5:35 AM  BMP  Glucose 70 - 99 mg/dL 161   BUN 8 - 23 mg/dL 16   Creatinine 0.96 - 1.00 mg/dL 0.45   Sodium 409 - 811 mmol/L 136   Potassium 3.5 - 5.1 mmol/L 4.3   Chloride 98 - 111 mmol/L 107   CO2 22 - 32 mmol/L 20   Calcium 8.9 - 10.3 mg/dL 8.4     A.m. cortisol 91.4 Blood culture NGTD    Vonna Drafts, MD 01/26/2024, 8:57 AM  PGY-2, [facility] FPTS Intern pager: [phone], text pages welcome Secure chat group [facility] Teaching Service

## 2024-01-26 NOTE — Assessment & Plan Note (Addendum)
Unclear etiology.  Does have a Hx of falls. CT head negative for acute intracranial abnormality and fracture. Mild R infraorbital soft tissue swelling noted. CT neck negative for fracture or listhesis but did demonstrate extensive debris noted in the paraesophageal tissues, possible impacted food bolus vs retained debris.  Echo with EF 50-55%.  Feel that TIA is possible although seems less likely and reassuringly has no residual neuro deficits on exam. -Plan for MRI brain without contrast, likely 2/3.  Patient has a pacemaker - PT/OT/SLP - Interrogate pacemaker - Orthostatic VS - Pain: Tylenol q6h PRN, Voltaren gel QID PRN - Avoid sedative medications - Fall precautions

## 2024-01-26 NOTE — Assessment & Plan Note (Addendum)
Seen on imaging, "Extensive debris noted within the paraesophageal soft tissues at the thoracic inlet versus within the esophageal lumen itself." CXR negative, aspiration risk.  -SLP consulted

## 2024-01-26 NOTE — Progress Notes (Signed)
Initial Nutrition Assessment  DOCUMENTATION CODES:      INTERVENTION:  Continue:  to encourage good oral intake Ensure Plus High Protein po BID, each supplement provides 350 kcal and 20 grams of protein. Multivitamin/minerals Will add assist with meals  to diet orders Magic cup TID with meals, each supplement provides 290 kcal and 9 grams of protein    NUTRITION DIAGNOSIS:   Inadequate oral intake related to decreased appetite as evidenced by meal completion < 25%.    GOAL:   Patient will meet greater than or equal to 90% of their needs    MONITOR:   PO intake  REASON FOR ASSESSMENT:   Consult Assessment of nutrition requirement/status  ASSESSMENT: 83 y.o.F, presented to ED from [facility] nursing facility via EMS with chief complaint of  unwitnessed fall with unknown down time. Upon admission Found to be hypothermic and placed on a bair hugger. Found to have UTI and started on CTX. CT head (01/25/24)- neg. CT Cervical Spine revealed, "Extensive debris noted within the paraesophageal soft tissues at the thoracic inlet versus within the esophageal lumen itself. PMH: HTN, T2DM, gastroparesis, depression, UTI GERD, HLD, complete hx EMR. Unable to meet with patient on this day. Review of EMR revealed, poor oral intake, BU&LE edema. SLP evaluation, PT and OT following. Patient possible needs assistance with meal ordering .  Weight trending down.   Admit weight: 46.5 kg Weight history:  01/25/24 46.5 kg  01/21/24 47.5 kg  12/02/23 47.5 kg  11/18/23 47 kg  11/12/23 45.8 kg  09/27/23 51.3 kg  08/01/23 51.3 kg  06/13/23 51.7 kg  05/27/23 51.9 kg  05/17/23 52 kg     Average Meal Intake:  25% intake x 1 recorded meals  Nutritionally Relevant Medications: Scheduled Meds:  feeding supplement  237 mL Oral BID BM   mirtazapine  7.5 mg Oral QHS   multivitamin with minerals  1 tablet Oral Daily    Labs Reviewed: CBG ranges from 114-107 mg/dL over the last 24 hours      NUTRITION - FOCUSED PHYSICAL EXAM:  Deferred   Diet Order:   Diet Order             Diet regular Room service appropriate? Yes; Fluid consistency: Thin  Diet effective now                   EDUCATION NEEDS:   Not appropriate for education at this time  Skin:  Skin Assessment: Reviewed RN Assessment  Last BM:  01/26/24 (Type 4)  Height:   Ht Readings from Last 1 Encounters:  01/25/24 5\' 3"  (1.6 m)    Weight:   Wt Readings from Last 1 Encounters:  01/25/24 46.5 kg    Ideal Body Weight:     BMI:  Body mass index is 18.16 kg/m.  Estimated Nutritional Needs:   Kcal:  1570-1800 kcal  Protein:  70-85 g  Fluid:  61ml/kcal    Jamelle Haring RDN, LDN Clinical Dietitian   If unable to reach, please contact "RD Inpatient" secure chat group between 8 am-4 pm daily"

## 2024-01-26 NOTE — Assessment & Plan Note (Deleted)
Denies urinary symptoms. UA positive for many bacteria, trace leukocytes. and nitrites. S/p CTX in ED. - Continue CTX - Follow urine culture

## 2024-01-26 NOTE — Progress Notes (Signed)
Patient found kneeling on ground by CNA. Patient returned to bed and assessed. No visible injuries noted. Physician noted and assessed patient at bedside. Patient expressed anxiety and inability to sleep. Will continue to monitor.

## 2024-01-26 NOTE — Assessment & Plan Note (Deleted)
Seen on imaging, "Extensive debris noted within the paraesophageal soft tissues at the thoracic inlet versus within the esophageal lumen itself." CXR negative, aspiration risk. Stroke swallow screen prior to diet and SLP consult.

## 2024-01-26 NOTE — Assessment & Plan Note (Deleted)
Hx of falls. Unwitnessed fall with head strike, without LOC. No signs of active bleeding other than from R knee abrasion. CK wnl. CT head negative for acute intracranial abnormality and fracture. Mild R infraorbital soft tissue swelling noted. CT neck negative for fracture or listhesis but did demonstrate extensive debris noted in the paraesophageal tissues, possible impacted food bolus vs retained debris. Likely due to environmental factors vs vasovagal, however will rule out other etiology with further imaging.  - PT/OT to treat - Continue antibiotics: CTX for UTI - Echo - Interrogate pacemaker - Orthostatic VS - MRI Brain w/o contrast for concern for possible stroke - Pain: Tylenol q6h PRN, Voltaren gel QID PRN - Avoid sedative medications - Fall precautions

## 2024-01-26 NOTE — Plan of Care (Signed)
FMTS Interim Progress Note  Received message from RN earlier today that patient has been agitated and wanting to get out of bed all day.  I evaluated her at bedside, she was resting and stated that she was feeling about the same but just had a bowel movement.  Alert and oriented on exam  Continue current plan of care, avoid sedating meds for now    ADDENDUM 2/2 1730: Received message from RN that CNA found patient trying to get out of bed with 1 knee on the ground.  No injury.  Was returned to bed without issue.  I evaluated the patient.  She does report some anxiety about not being able to fall asleep as she did not get any rest last night either.  On exam she continues to be alert and oriented and does not have any new injuries or wounds on her legs.  Denies new pain.  Added 25 mg Seroquel x 1 tonight, hopefully this will help her sleep and may prevent additional episodes of agitation overnight/tomorrow  Vonna Drafts, MD 01/26/2024, 5:13 PM PGY-2, [facility] Family [facility] Service pager [phone]

## 2024-01-26 NOTE — Assessment & Plan Note (Deleted)
See photo below, superficial. Wound care as indicated. Keep clean and dry.

## 2024-01-26 NOTE — Assessment & Plan Note (Addendum)
UA with leuks, nitrites, bacteria.  Urine culture 1/30 showed pansensitive E. coli.  Repeat culture pending.  Received ceftriaxone on admission. - Duricef BID - Follow urine culture

## 2024-01-26 NOTE — Assessment & Plan Note (Deleted)
T min 94.9 rectal. Put on bair hugger. Differential as above for hypothermia. Etiology unclear at this time, will  - Continue Lawyer and monitor temps - Monitor blood cultures

## 2024-01-26 NOTE — Assessment & Plan Note (Addendum)
Wound care as indicated. Keep clean and dry.

## 2024-01-27 ENCOUNTER — Telehealth: Payer: Self-pay

## 2024-01-27 ENCOUNTER — Ambulatory Visit (INDEPENDENT_AMBULATORY_CARE_PROVIDER_SITE_OTHER): Payer: Medicare Other

## 2024-01-27 ENCOUNTER — Other Ambulatory Visit (HOSPITAL_COMMUNITY): Payer: Self-pay

## 2024-01-27 ENCOUNTER — Inpatient Hospital Stay (HOSPITAL_COMMUNITY): Payer: 59

## 2024-01-27 DIAGNOSIS — I442 Atrioventricular block, complete: Secondary | ICD-10-CM

## 2024-01-27 DIAGNOSIS — R296 Repeated falls: Secondary | ICD-10-CM | POA: Diagnosis not present

## 2024-01-27 DIAGNOSIS — E43 Unspecified severe protein-calorie malnutrition: Secondary | ICD-10-CM | POA: Insufficient documentation

## 2024-01-27 LAB — GLUCOSE, CAPILLARY
Glucose-Capillary: 188 mg/dL — ABNORMAL HIGH (ref 70–99)
Glucose-Capillary: 135 mg/dL — ABNORMAL HIGH (ref 70–99)
Glucose-Capillary: 185 mg/dL — ABNORMAL HIGH (ref 70–99)

## 2024-01-27 LAB — CBC
HCT: 38.7 % (ref 36.0–46.0)
Hemoglobin: 13 g/dL (ref 12.0–15.0)
MCH: 33.2 pg (ref 26.0–34.0)
MCHC: 33.6 g/dL (ref 30.0–36.0)
MCV: 99 fL (ref 80.0–100.0)
Platelets: 415 10*3/uL — ABNORMAL HIGH (ref 150–400)
RBC: 3.91 MIL/uL (ref 3.87–5.11)
RDW: 14.2 % (ref 11.5–15.5)
WBC: 15 10*3/uL — ABNORMAL HIGH (ref 4.0–10.5)
nRBC: 0 % (ref 0.0–0.2)

## 2024-01-27 LAB — URINE CULTURE: Culture: 80000 — AB

## 2024-01-27 MED ORDER — POLYETHYLENE GLYCOL 3350 17 GM/SCOOP PO POWD
17.0000 g | Freq: Every day | ORAL | 0 refills | Status: DC
  Filled 2024-01-27: qty 476, 28d supply, fill #0

## 2024-01-27 MED ORDER — ROSUVASTATIN CALCIUM 10 MG PO TABS
10.0000 mg | ORAL_TABLET | Freq: Every day | ORAL | 0 refills | Status: DC
  Filled 2024-01-27: qty 90, 90d supply, fill #0

## 2024-01-27 MED ORDER — THIAMINE MONONITRATE 100 MG PO TABS
100.0000 mg | ORAL_TABLET | Freq: Every day | ORAL | Status: DC
  Administered 2024-01-27 – 2024-02-03 (×8): 100 mg via ORAL
  Filled 2024-01-27 (×8): qty 1

## 2024-01-27 MED ORDER — CEFADROXIL 500 MG PO CAPS: 500.0000 mg | ORAL_CAPSULE | Freq: Two times a day (BID) | ORAL | Status: DC

## 2024-01-27 MED ORDER — SODIUM CHLORIDE 0.9 % IV BOLUS
250.0000 mL | Freq: Once | INTRAVENOUS | Status: AC
  Administered 2024-01-27: 250 mL via INTRAVENOUS

## 2024-01-27 NOTE — Assessment & Plan Note (Addendum)
UA with leuks, nitrites, bacteria.  Urine culture 1/30 showed pansensitive E. coli. Received ceftriaxone on admission on 2/1. - Duricef BID (2/2-2/5)

## 2024-01-27 NOTE — Assessment & Plan Note (Addendum)
Unclear etiology.  Does have a Hx of falls. CT head negative for acute intracranial abnormality and fracture. Mild R infraorbital soft tissue swelling noted. CT neck negative for fracture or listhesis but did demonstrate extensive debris noted in the paraesophageal tissues, possible impacted food bolus vs retained debris.  Echo with EF 50-55%.  Feel that TIA is possible although seems less likely and reassuringly has no residual neuro deficits on exam. Unable to perform MR Brain d/t unstable lead on interrogation. Orthostatic VS positive this AM, will give small NS bolus and repeat orthostatics. - PT/OT recommended Home Health - 250 mL NS bolus - Ted hose - Orthostatic VS this PM - Pain: Tylenol q6h PRN, Voltaren gel QID PRN - Avoid sedative medications - Fall precautions

## 2024-01-27 NOTE — Progress Notes (Signed)
Nutrition Follow-up  DOCUMENTATION CODES:   Severe malnutrition in context of chronic illness  INTERVENTION:   Ensure Enlive po BID, each supplement provides 350 kcal and 20 grams of protein. Magic cup TID with meals, each supplement provides 290 kcal and 9 grams of protein Multivitamin with minerals daily 100 mg thiamine daily  Nutrition support if within GOC  NUTRITION DIAGNOSIS:   Severe Malnutrition related to chronic illness as evidenced by severe muscle depletion, severe fat depletion, per patient/family report.  - New diagnosis   GOAL:   Patient will meet greater than or equal to 90% of their needs  - Ongoing   MONITOR:   PO intake, Supplement acceptance, Labs, I & O's  REASON FOR ASSESSMENT:   Consult Assessment of nutrition requirement/status  ASSESSMENT:   83 y.o.F, presented to ED from [facility] facility via EMS from unwitnessed fall. Found to have hypothermia, UTI. PMH: HTN, T2DM, gastroparesis, depression, UTI GERD, HLD,  DDD, peripheral neuropathy, depression CAD s/p CABG and pacemaker, complete heart block s/p pacemaker,   Gastrectomy (Biliroth II)  Patient is a poor historian, does state she has not been eating. Called daughter [name]. She reports seeing a decline starting in October 2024 where she stopped eating. She thinks it is from depression, they started her on depression medication where she bounced back a little but then in the last month has returned to her baseline of poor appetite and PO intake.Her daughter is unsure how much she eats at assisted living but reports when she is there she only eats bites of her food.  Her daughter states she used to weigh 120-125 pounds 1 year ago and then was down to her lowest weight of 98 lbs she reports she gained weight where she was 106 lbs. Currently she is 102 lbs and has lost at least 18 lbs in 1 year per family report.   She did not have a breakfast tray ordered this morning and only had a couple  bites of a magic cup. She was room service appropriate and I do not think at this time she can call down to place her own meals, placed her room service with assist. Can feed herself. Uses a walker to ambulate.    Admit weight: 46.5 kg  Current weight: 46.5 kg    Weight History:  01/25/24 46.5 kg  01/21/24 47.5 kg  12/02/23 47.5 kg  11/18/23 47 kg  11/12/23 45.8 kg  09/27/23 51.3 kg  08/01/23 51.3 kg  06/13/23 51.7 kg  05/27/23 51.9 kg  05/17/23 52 kg   Average Meal Intake: 2/1: 25% intake x 1 recorded meals  Nutritionally Relevant Medications: Scheduled Meds:  feeding supplement  237 mL Oral BID BM   insulin aspart  0-15 Units Subcutaneous TID WC   insulin aspart  0-5 Units Subcutaneous QHS   mirtazapine  7.5 mg Oral QHS   multivitamin with minerals  1 tablet Oral Daily   Labs Reviewed: No updated labs  CBG ranges from 118-232 mg/dL over the last 24 hours HgbA1c no updated labs   NUTRITION - FOCUSED PHYSICAL EXAM:  Flowsheet Row Most Recent Value  Orbital Region Severe depletion  Upper Arm Region Severe depletion  Thoracic and Lumbar Region Severe depletion  Buccal Region Severe depletion  Temple Region Severe depletion  Clavicle Bone Region Severe depletion  Clavicle and Acromion Bone Region Severe depletion  Scapular Bone Region Severe depletion  Dorsal Hand Severe depletion  Patellar Region Severe depletion  Anterior Thigh Region Severe  depletion  Posterior Calf Region Severe depletion  Edema (RD Assessment) None  Hair Reviewed  Eyes Reviewed  Mouth Reviewed  [NO teeth]  Skin Reviewed  Nails Reviewed       Diet Order:   Diet Order             Diet regular Room service appropriate? Yes with Assist; Fluid consistency: Thin  Diet effective now                   EDUCATION NEEDS:   Not appropriate for education at this time  Skin:  Skin Assessment: Skin Integrity Issues: Skin Integrity Issues:: Other (Comment) Other: R knee abrasion  Last  BM:  01/26/24 (Type 4)  Height:   Ht Readings from Last 1 Encounters:  01/25/24 5\' 3"  (1.6 m)    Weight:   Wt Readings from Last 1 Encounters:  01/25/24 46.5 kg    Ideal Body Weight:  52.3 kg  BMI:  Body mass index is 18.16 kg/m.  Estimated Nutritional Needs:   Kcal:  1570-1800 kcal  Protein:  70-85 g  Fluid:  29ml/kcal   Elliot Dally, RD Registered Dietitian  See Amion for more information

## 2024-01-27 NOTE — Plan of Care (Signed)

## 2024-01-27 NOTE — Assessment & Plan Note (Addendum)
Resolved - Possibly in the setting of UTI.  T min 94.9 rectal.  Temp now improving off Bair hugger.  97.26F this morning.  AM cortisol 18.8 consistent with normal adrenal function. - Continue to monitor temp - Monitor blood cultures, NGTD - Treatment of UTI as noted

## 2024-01-27 NOTE — Assessment & Plan Note (Addendum)
Seen on imaging, "Extensive debris noted within the paraesophageal soft tissues at the thoracic inlet versus within the esophageal lumen itself." CXR negative, aspiration risk. SLP consulted and recommended regular thin liquid diet, no follow up needed.

## 2024-01-27 NOTE — Progress Notes (Signed)
Daily Progress Note Intern Pager: [phone]  Patient name: Judith [name] Medical record number: [id number] Date of birth: January 10, 1941 Age: 83 y.o. Gender: female  Primary Care Provider: Darral Dash, DO Consultants: None Code Status: Full Code  Pt Overview and Major Events to Date:  2/1: Admitted  Assessment and Plan: Judith [name] is a 83 y.o. female with past medical history of T2DM, degenerative disease, peripheral neuropathy, depression, and unintentional weight loss presenting with fall, found to be hypothermic in the setting of likely UTI.  Assessment & Plan Fall Unclear etiology.  Does have a Hx of falls. CT head negative for acute intracranial abnormality and fracture. Mild R infraorbital soft tissue swelling noted. CT neck negative for fracture or listhesis but did demonstrate extensive debris noted in the paraesophageal tissues, possible impacted food bolus vs retained debris.  Echo with EF 50-55%.  Feel that TIA is possible although seems less likely and reassuringly has no residual neuro deficits on exam. Unable to perform MR Brain d/t unstable lead on interrogation. Orthostatic VS positive this AM, will give small NS bolus and repeat orthostatics. - PT/OT recommended Home Health - 250 mL NS bolus - Ted hose - Orthostatic VS this PM - Pain: Tylenol q6h PRN, Voltaren gel QID PRN - Avoid sedative medications - Fall precautions UTI (urinary tract infection) UA with leuks, nitrites, bacteria.  Urine culture 1/30 showed pansensitive E. coli. Received ceftriaxone on admission on 2/1. - Duricef BID (2/2-2/5) Esophageal abnormality Seen on imaging, "Extensive debris noted within the paraesophageal soft tissues at the thoracic inlet versus within the esophageal lumen itself." CXR negative, aspiration risk. SLP consulted and recommended regular thin liquid diet, no follow up needed.  Hypothermia Resolved - Possibly in the setting of UTI.  T min 94.9 rectal.  Temp now  improving off Bair hugger.  97.96F this morning.  AM cortisol 18.8 consistent with normal adrenal function. - Continue to monitor temp - Monitor blood cultures, NGTD - Treatment of UTI as noted Knee abrasion  Wound care as indicated. Keep clean and dry.  Protein-calorie malnutrition, severe   Chronic and Stable Problems: T2DM: mSSI HTN: On Coreg 12.5 mg BID daily at home. Will likely hold upon discharge HLD: Continue Crestor 10 mg daily DDD: Chronic issue. No sign of fracture or listhesis in CT neck. Manage any pain with Tylenol and Voltaren gel at this time. Will hold Oxycodone at discharge.  Peripheral neuropathy: On Gabapentin 100 mg in AM, PM, and 400 mg at bedtime, restart when able to verify medications  Depression: On Cymbalta 60 mg daily  Complete Heart Block with pacemaker: PPM interrogation which demonstrated an unsafe lead Pulmonary nodules: Incidental 3 mm left upper pulmonary nodule found 05/2023 will need repeat low dose CT in 6-12 months, will put in follow up instructions in discharge summary  Sleep Apnea: Not on CPAP Gastroparesis: Hx of partial gastrectomy   FEN/GI: Regular Diet PPx: Lovenox Dispo:Home with home health  Subjective:  Patient is doing well this morning. She denies any symptoms except some abdominal pain but wanted to go to the bathroom at the time.   Objective: Temp:  [97.2 F (36.2 C)-98.4 F (36.9 C)] 97.6 F (36.4 C) (02/03 1204) Pulse Rate:  [92-109] 93 (02/03 0756) Resp:  [16-18] 16 (02/03 0756) BP: (118-143)/(73-78) 143/78 (02/03 0756) SpO2:  [89 %-100 %] 98 % (02/03 1047) Physical Exam: General: NAD laying in bed Cardiovascular: RRR. No M/R/G Respiratory: CTAB. Normal WOB on RA.  Abdomen: Soft,  non-tender, non-distended Extremities: No BLE edema, able to move all extremities Neuro: Alert and oriented to self and aware as to why she is in the hospital. CN II: PERRL CN III, IV,VI: EOMI CV V: Normal sensation in V1, V2, V3 CVII:  Symmetric smile and brow raise CN VIII: Normal hearing CN IX,X: Symmetric palate raise  CN XI: 5/5 shoulder shrug CN XII: Symmetric tongue protrusion  UE and LE strength 4/5 Normal sensation in UE and LE bilaterally  No ataxia with finger to nose, normal heel to shin   Laboratory: Most recent CBC Lab Results  Component Value Date   WBC 15.0 (H) 01/27/2024   HGB 13.0 01/27/2024   HCT 38.7 01/27/2024   MCV 99.0 01/27/2024   PLT 415 (H) 01/27/2024   Most recent BMP    Latest Ref Rng & Units 01/25/2024    5:35 AM  BMP  Glucose 70 - 99 mg/dL 440   BUN 8 - 23 mg/dL 16   Creatinine 1.02 - 1.00 mg/dL 7.25   Sodium 366 - 440 mmol/L 136   Potassium 3.5 - 5.1 mmol/L 4.3   Chloride 98 - 111 mmol/L 107   CO2 22 - 32 mmol/L 20   Calcium 8.9 - 10.3 mg/dL 8.4    Plt: 347>425  Imaging/Diagnostic Tests: No new imaging.  Fortunato Curling, DO 01/27/2024, 4:04 PM  PGY-1, [facility] FPTS Intern pager: [phone], text pages welcome Secure chat group [facility] Teaching Service

## 2024-01-27 NOTE — Progress Notes (Signed)
Physical Therapy Treatment Patient Details Name: Judith [name] MRN: [id number] DOB: Jun 27, 1941 Today's Date: 01/27/2024   History of Present Illness Pt is 83 yo F who presented to ED after a fall at her ALF. Found to have UTI and hypothermic. PMH includes: T2DM, multilevel DDD, peripheral neuropathy, depression, unintentional weight loss, and CAD s/p CABG and pacemaker, complete heart block.    PT Comments  Patient willing to participate with therapy, however declined to ambulate and stated "I can't do it today", per patient. The patient required Mod A +2 to assist with transition to EOB. Patient needed additional assistance to elevate trunk and move bilateral LEs to EOB. Patient required Mod A +2 physical assistance and utilized a RW to demonstrate a stand pivot transfer to the [facility]. Once seated back onto the bed, the patient agreed to perform seated marches to improve hip flexion strength needed for functional mobility. The patient will continue to benefit from acute care rehab in order to maximize an optimal level of independence.    If plan is discharge home, recommend the following: A little help with walking and/or transfers;A little help with bathing/dressing/bathroom;Assistance with cooking/housework;Assist for transportation;Help with stairs or ramp for entrance;Direct supervision/assist for medications management   Can travel by private vehicle        Equipment Recommendations  None recommended by PT    Recommendations for Other Services       Precautions / Restrictions Precautions Precautions: Fall Restrictions Weight Bearing Restrictions Per Provider Order: No     Mobility  Bed Mobility Overal bed mobility: Needs Assistance Bed Mobility: Supine to Sit, Sit to Supine     Supine to sit: Mod assist, HOB elevated Sit to supine: Mod assist   General bed mobility comments: +2 Physical Assist to elevate trunk and move bilateral LEs to EOB    Transfers Overall transfer  level: Needs assistance Equipment used: Rolling walker (2 wheels) Transfers: Sit to/from Stand, Bed to chair/wheelchair/BSC Sit to Stand: Mod assist, +2 physical assistance           General transfer comment: Patient was Mod A +2 with physical assistance to stand. Some verbal cueing for correct placement of hands on RW    Ambulation/Gait                   Stairs             Wheelchair Mobility     Tilt Bed    Modified Rankin (Stroke Patients Only)       Balance Overall balance assessment: History of Falls, Needs assistance Sitting-balance support: Bilateral upper extremity supported, Feet supported Sitting balance-Leahy Scale: Fair   Postural control: Posterior lean Standing balance support: Bilateral upper extremity supported, Reliant on assistive device for balance Standing balance-Leahy Scale: Poor Standing balance comment: RW                            Cognition Arousal: Alert Behavior During Therapy: Flat affect Overall Cognitive Status: Within Functional Limits for tasks assessed                                 General Comments: Slow Processing        Exercises General Exercises - Lower Extremity Hip Flexion/Marching: AROM, 10 reps, Seated, Both    General Comments        Pertinent Vitals/Pain Pain Assessment Pain Assessment:  Faces Faces Pain Scale: No hurt Pain Intervention(s): Monitored during session    Home Living                          Prior Function            PT Goals (current goals can now be found in the care plan section) Acute Rehab PT Goals PT Goal Formulation: With patient Time For Goal Achievement: 02/08/24 Potential to Achieve Goals: Good Progress towards PT goals: Progressing toward goals    Frequency    Min 1X/week      PT Plan      Co-evaluation              AM-PAC PT "6 Clicks" Mobility   Outcome Measure  Help needed turning from your back to  your side while in a flat bed without using bedrails?: A Little Help needed moving from lying on your back to sitting on the side of a flat bed without using bedrails?: A Lot Help needed moving to and from a bed to a chair (including a wheelchair)?: A Lot Help needed standing up from a chair using your arms (e.g., wheelchair or bedside chair)?: A Lot Help needed to walk in hospital room?: Total Help needed climbing 3-5 steps with a railing? : Total 6 Click Score: 11    End of Session Equipment Utilized During Treatment: Gait belt Activity Tolerance: Patient limited by fatigue Patient left: in bed;with call bell/phone within reach;with bed alarm set   PT Visit Diagnosis: Unsteadiness on feet (R26.81);History of falling (Z91.81)     Time: 4782-9562 PT Time Calculation (min) (ACUTE ONLY): 24 min  Charges:    $Therapeutic Activity: 23-37 mins PT General Charges $$ ACUTE PT VISIT: 1 Visit                     Doreen Beam, SPT   Keelee Yankey 01/27/2024, 1:20 PM

## 2024-01-27 NOTE — Progress Notes (Signed)
FMTS Attending Brief Note In to see patient this morning she is alert and oriented to person and year, although it takes her a bit.  She denies any ongoing symptoms.  Overnight she did attempt to get out of the bed.  Knee is examined.  Has good range of motion and no pain with this.  The cause of her fall is likely multifactorial she is on several medications that could predispose to this including oxycodone.  She is also orthostatic.  Very low suspicion for stroke given her neurologic exam this morning.  Additionally her pacemaker is not compatible with an MRI so we will defer this at this time.  Will apply TED hose and give some IV fluids for orthostatic hypotensive..  Will hold carvedilol and her oxycodone at time of discharge most likely given the side effects from these.

## 2024-01-27 NOTE — TOC Initial Note (Signed)
Transition of Care [facility]) - Initial/Assessment Note    Patient Details  Name: Judith [name] MRN: [id number] Date of Birth: [birth year]  Transition of Care [facility]) CM/SW Contact:    Carley Hammed, LCSW Phone Number: 01/27/2024, 1:42 PM  Clinical Narrative:                 CSW noting pt is from [facility]. Recommendation is currently for pt to return with [facility] services. ALF uses [facility], orders will be sent directly to [facility] at DC. CSW spoke with [facility] and noted DC documentation can be sent to F# [phone] attn: Desiree. CSW attempted to follow up with pt but she was sleeping heavilly, will speak with her at a later time. Per facility, dtr will likely transport at DC. TOC will continue to follow.   Expected Discharge Plan: Assisted Living Barriers to Discharge: Continued Medical Work up   Patient Goals and CMS Choice     Choice offered to / list presented to : Patient      Expected Discharge Plan and Services In-house Referral: Clinical Social Work Discharge Planning Services: CM Consult Post Acute Care Choice: Resumption of Svcs/PTA Provider, Home Health Living arrangements for the past 2 months: Assisted Living Facility                                      Prior Living Arrangements/Services Living arrangements for the past 2 months: Assisted Living Facility Lives with:: Facility Resident Patient language and need for interpreter reviewed:: Yes        Need for Family Participation in Patient Care: Yes (Comment) Care giver support system in place?: Yes (comment)   Criminal Activity/Legal Involvement Pertinent to Current Situation/Hospitalization: No - Comment as needed  Activities of Daily Living   ADL Screening (condition at time of admission) Independently performs ADLs?: Yes (appropriate for developmental age) Is the patient deaf or have difficulty hearing?: No Does the patient have difficulty seeing, even when wearing glasses/contacts?:  No Does the patient have difficulty concentrating, remembering, or making decisions?: No  Permission Sought/Granted                  Emotional Assessment Appearance:: Appears stated age     Orientation: : Oriented to Place, Oriented to Self, Oriented to  Time, Oriented to Situation Alcohol / Substance Use: Not Applicable Psych Involvement: No (comment)  Admission diagnosis:  Fall [W19.XXXA] Acute cystitis without hematuria [N30.00] Frequent falls [R29.6] Hypothermia, initial encounter [T68.XXXA] Patient Active Problem List   Diagnosis Date Noted   Fall 01/25/2024   Hypothermia 01/25/2024   Esophageal abnormality 01/25/2024   Knee abrasion 01/25/2024   Frequent falls 01/25/2024   Goals of care, counseling/discussion 12/05/2023   Depressed mood 11/12/2023   Suicidal ideation 10/29/2023   Pulmonary nodule 06/17/2023   Pacemaker battery depletion 04/25/2023   UTI (urinary tract infection) 12/28/2022   Urinary incontinence 08/22/2022   Sleep apnea    Hyperlipidemia    Polycythemia    Unspecified inflammatory spondylopathy, cervical region (HCC) 11/15/2020   Multilevel degenerative disc disease 04/03/2018   Acute blood loss anemia 01/17/2018   Unintentional weight loss 01/17/2018   S/P partial gastrectomy    Peripheral neuropathy    Benign essential HTN    Controlled type 2 diabetes mellitus with diabetic neuropathy, with long-term current use of insulin (HCC)    Depression    Presence  of cardiac pacemaker    Gout    Cervical spine arthritis 04/05/2016   Long-term insulin use in type 2 diabetes (HCC) 04/05/2016   PCP:  Darral Dash, DO Pharmacy:   [facility] [place], [place] - [address] [place] [address] Phone: [phone] Fax: [phone]  [id number] Ginette Otto [facility] [address] [place] [address] Phone: [phone] Fax: [phone]  [facility] [address] [place] [address] Phone: [phone] Fax: [phone]     Social Drivers of Health (SDOH) Social History: SDOH Screenings   Food Insecurity: No Food Insecurity (01/25/2024)  Housing: Low Risk  (01/25/2024)  Transportation Needs: No Transportation Needs (01/25/2024)  Utilities: Not At Risk (01/25/2024)  Alcohol Screen: Low Risk  (09/27/2023)  Depression (PHQ2-9): Low Risk  (12/02/2023)  Financial Resource Strain: Low Risk  (10/19/2023)  Physical Activity: Unknown (10/19/2023)  Recent Concern: Physical Activity - Inactive (09/27/2023)  Social Connections: Socially Isolated (01/25/2024)  Stress: Patient Declined (10/19/2023)  Tobacco Use: Low Risk  (01/25/2024)  Health Literacy: Adequate Health Literacy (09/27/2023)   SDOH Interventions:     Readmission Risk Interventions     No data to display

## 2024-01-27 NOTE — Telephone Encounter (Signed)
Judith [name] with [facility] calls nurse line to verify Oxycodone.   She reports they have been giving the medication to her every 6 hours per order they received. However, she reports they have an order for every 8 hours as well.   Per 12/18 prescription both directions are on there.   Advised will forward to PCP for clarification.  We will need to fax [facility] the appropriate order information.   Fax: [phone]

## 2024-01-27 NOTE — Discharge Instructions (Addendum)
Dear Judith [name],  Thank you for letting us participate in your care. You were hospitalized for further workup after falling in your home. You were also found to have a UTI which you were treated with antibiotics for, which will be continued on discharge to complete on 2/5.   We discontinued your oxycodone and carvedilol upon discharge because these could cause side effects which could increase the risk of falling. You were also orthostatic so we recommend so positional changes and staying well hydrated.  POST-HOSPITAL & CARE INSTRUCTIONS Go to your follow up appointments (listed below)   DOCTOR'S APPOINTMENT   Future Appointments  Date Time Provider Department Center  02/06/2024 11:00 AM [facility] DX DEXA 1 [facility] GI-BREAST CE  04/14/2024  8:35 AM Sherrie George, MD TRE-TRE None  04/27/2024  7:20 AM [facility] DEVICE REMOTES [facility]  07/27/2024  7:20 AM [facility] DEVICE REMOTES [facility]  09/28/2024  3:00 PM [facility] ANNUAL WELLNESS VISIT [facility]  10/26/2024  7:20 AM [facility] DEVICE REMOTES [facility]  01/25/2025  7:20 AM [facility] DEVICE REMOTES [facility]  04/26/2025  7:20 AM [facility] DEVICE REMOTES [facility]     Take care and be well!  Family Medicine Teaching Service Inpatient Team [place]  [facility]  [address] [place], [place] [address] [phone]

## 2024-01-27 NOTE — Assessment & Plan Note (Addendum)
Wound care as indicated. Keep clean and dry.

## 2024-01-27 NOTE — Plan of Care (Signed)
  Problem: Clinical Measurements: Goal: Ability to maintain clinical measurements within normal limits will improve Outcome: Progressing   Problem: Clinical Measurements: Goal: Will remain free from infection Outcome: Progressing   Problem: Coping: Goal: Level of anxiety will decrease Outcome: Progressing   Problem: Nutrition: Goal: Adequate nutrition will be maintained Outcome: Progressing   Problem: Safety: Goal: Ability to remain free from injury will improve Outcome: Progressing   Problem: Pain Managment: Goal: General experience of comfort will improve and/or be controlled Outcome: Progressing

## 2024-01-28 ENCOUNTER — Other Ambulatory Visit (HOSPITAL_COMMUNITY): Payer: Self-pay

## 2024-01-28 ENCOUNTER — Encounter: Payer: Self-pay | Admitting: Cardiovascular Disease

## 2024-01-28 DIAGNOSIS — N3000 Acute cystitis without hematuria: Secondary | ICD-10-CM

## 2024-01-28 LAB — CUP PACEART REMOTE DEVICE CHECK
Battery Remaining Longevity: 90 mo
Battery Remaining Percentage: 100 %
Brady Statistic RA Percent Paced: 3 %
Brady Statistic RV Percent Paced: 100 %
Date Time Interrogation Session: 20250201054900
Lead Channel Impedance Value: 517 Ohm
Lead Channel Impedance Value: 599 Ohm
Lead Channel Pacing Threshold Amplitude: 0.4 V
Lead Channel Pacing Threshold Amplitude: 0.7 V
Lead Channel Pacing Threshold Pulse Width: 0.4 ms
Lead Channel Pacing Threshold Pulse Width: 0.4 ms
Lead Channel Setting Pacing Amplitude: 2 V
Lead Channel Setting Pacing Amplitude: 2 V
Lead Channel Setting Pacing Pulse Width: 0.4 ms
Lead Channel Setting Sensing Sensitivity: 3.5 mV
Pulse Gen Serial Number: 763022
Zone Setting Status: 755011

## 2024-01-28 LAB — GLUCOSE, CAPILLARY
Glucose-Capillary: 211 mg/dL — ABNORMAL HIGH (ref 70–99)
Glucose-Capillary: 191 mg/dL — ABNORMAL HIGH (ref 70–99)
Glucose-Capillary: 93 mg/dL (ref 70–99)
Glucose-Capillary: 129 mg/dL — ABNORMAL HIGH (ref 70–99)

## 2024-01-28 LAB — VITAMIN B12: Vitamin B-12: 772 pg/mL (ref 180–914)

## 2024-01-28 MED ORDER — MELATONIN 3 MG PO TABS
3.0000 mg | ORAL_TABLET | Freq: Every evening | ORAL | Status: DC | PRN
Start: 2024-01-28 — End: 2024-02-03
  Administered 2024-01-28 – 2024-01-29 (×2): 3 mg via ORAL
  Filled 2024-01-28 (×2): qty 1

## 2024-01-28 MED ORDER — CEFADROXIL 500 MG PO CAPS: 500.0000 mg | ORAL_CAPSULE | Freq: Two times a day (BID) | ORAL | Status: DC

## 2024-01-28 MED ORDER — OXYCODONE HCL 5 MG PO TABS: 2.5000 mg | ORAL_TABLET | Freq: Two times a day (BID) | ORAL | 0 refills | Status: DC | PRN

## 2024-01-28 MED ORDER — ALUM & MAG HYDROXIDE-SIMETH 200-200-20 MG/5ML PO SUSP
30.0000 mL | Freq: Four times a day (QID) | ORAL | Status: DC | PRN
  Administered 2024-01-28: 30 mL via ORAL
  Filled 2024-01-28: qty 30

## 2024-01-28 MED ORDER — OXYCODONE HCL 5 MG PO TABS: 2.5000 mg | ORAL_TABLET | Freq: Two times a day (BID) | ORAL | Status: DC | PRN

## 2024-01-28 MED ORDER — OXYCODONE HCL 5 MG PO TABS
2.5000 mg | ORAL_TABLET | Freq: Two times a day (BID) | ORAL | Status: DC | PRN
  Administered 2024-01-28 – 2024-01-31 (×7): 2.5 mg via ORAL
  Filled 2024-01-28 (×7): qty 1

## 2024-01-28 NOTE — Progress Notes (Addendum)
 Daily Progress Note Intern Pager: [phone]  Patient name: Judith [name] Medical record number: [id number] Date of birth: 04/26/1941 Age: 83 y.o. Gender: female  Primary Care Provider: Dartha Geralds, DO Consultants: None Code Status: Full Code  Pt Overview and Major Events to Date:  2/1: Admitted  Assessment and Plan: Judith [name] is a 83 y.o. female with past medical history of T2DM, degenerative disease, peripheral neuropathy, depression, and unintentional weight loss presenting with fall, found to be hypothermic in the setting of likely UTI.   Initially patient was going to go back to her ALF, however per family (who report that she isn't at her baseline - normally independent w/ ADLs at baseline) and PT/OT SNF has been recommended. Pending SNF placement.  Assessment & Plan Fall Unclear etiology.  Does have a Hx of falls. CT head negative for acute intracranial abnormality and fracture. Mild R infraorbital soft tissue swelling noted. CT neck negative for fracture or listhesis but did demonstrate extensive debris noted in the paraesophageal tissues, possible impacted food bolus vs retained debris.  Echo with EF 50-55%. Unable to perform MR Brain d/t unstable lead on interrogation. Orthostatic VS positive yesterday AM, but improved with 250 cc NS bolus and ted hose upon repeat orthostatics. - PT/OT recommended Home Health - Ted hose - Pain: Tylenol  q6h PRN, Voltaren  gel QID PRN - Avoid sedative medications - Fall precautions - SNF placement pending UTI (urinary tract infection) UA with leuks, nitrites, bacteria.  Urine culture 1/30 showed pansensitive E. coli. Received ceftriaxone  on admission on 2/1. - Cefadroxil  BID (2/2-2/5) Esophageal abnormality Seen on imaging, Extensive debris noted within the paraesophageal soft tissues at the thoracic inlet versus within the esophageal lumen itself. CXR negative, aspiration risk. SLP consulted and recommended regular thin liquid  diet, no follow up needed.  Protein-calorie malnutrition, severe RD consulted, appreciate recommendations. Knee abrasion  Wound care as indicated. Keep clean and dry.  Hypothermia (Resolved: 01/28/2024) Resolved - Possibly in the setting of UTI.  T min 94.9 rectal.  Temp now improving off Bair hugger.  97.59F this morning.  AM cortisol 18.8 consistent with normal adrenal function. - Continue to monitor temp - Monitor blood cultures, NGTD - Treatment of UTI as noted  Chronic and Stable Problems: T2DM: Sugars 191-210.  mSSI HTN: On Coreg  12.5 mg BID at home. Will likely hold upon discharge HLD: Continue Crestor  10 mg daily DDD: Chronic issue. No sign of fracture or listhesis in CT neck. Manage any pain with Tylenol  and Voltaren  gel at this time. Will hold Oxycodone  at discharge.  Peripheral neuropathy: On Gabapentin  100 mg in AM, PM, and 400 mg at bedtime, restart when able to verify medications  Depression: On Cymbalta  60 mg daily  Complete Heart Block with pacemaker: PPM interrogation which demonstrated an unsafe lead for MRI  Pulmonary nodules: Incidental 3 mm left upper pulmonary nodule found 05/2023 will need repeat low dose CT in 6-12 months, will put in follow up instructions in discharge summary  Sleep Apnea: Not on CPAP Gastroparesis: Hx of partial gastrectomy   FEN/GI: Regular Diet PPx: Lovenox  Dispo: SNF pending placement.   Subjective:  Patient complained of some bilateral LE pain today and had some edema on exam likely due to being sedentary. She request help with standing up and moving around, which PT helped with.   Daughter reports that patient is normally independent with ADLs at baseline and would prefer a SNF. PT/OT has agreed. Pending SNF placement at this time.  Objective: Temp:  [97.5 F (36.4 C)-98.1 F (36.7 C)] 98.1 F (36.7 C) (02/04 0845) Pulse Rate:  [89-116] 116 (02/04 0845) Resp:  [16-18] 18 (02/04 0845) BP: (141-166)/(74-94) 141/74 (02/04 0845) SpO2:   [98 %-99 %] 98 % (02/04 0505) Physical Exam: General: Sitting in bedside chair comfortably.  Cardiovascular: RRR. No M/R/G Respiratory: CTAB. Normal WOB on RA.  Abdomen: Soft, non-tender, non-distended. Normoactive bowel sounds.  Extremities: 1+ BLE edema, able to move all extremities. Neuro: Alert and oriented to self and aware as to why she is in the hospital. No focal neurological deficits noted.   Laboratory: Most recent CBC Lab Results  Component Value Date   WBC 15.0 (H) 01/27/2024   HGB 13.0 01/27/2024   HCT 38.7 01/27/2024   MCV 99.0 01/27/2024   PLT 415 (H) 01/27/2024   Most recent BMP    Latest Ref Rng & Units 01/25/2024    5:35 AM  BMP  Glucose 70 - 99 mg/dL 885   BUN 8 - 23 mg/dL 16   Creatinine 9.55 - 1.00 mg/dL 9.20   Sodium 864 - 854 mmol/L 136   Potassium 3.5 - 5.1 mmol/L 4.3   Chloride 98 - 111 mmol/L 107   CO2 22 - 32 mmol/L 20   Calcium  8.9 - 10.3 mg/dL 8.4    Imaging/Diagnostic Tests: No new imaging.  Janna Ferrier, DO 01/28/2024, 1:21 PM  PGY-1, [facility] Family Medicine FPTS Intern pager: [phone], text pages welcome Secure chat group [facility] Teaching Service

## 2024-01-28 NOTE — Assessment & Plan Note (Addendum)
 Unclear etiology.  Does have a Hx of falls. CT head negative for acute intracranial abnormality and fracture. Mild R infraorbital soft tissue swelling noted. CT neck negative for fracture or listhesis but did demonstrate extensive debris noted in the paraesophageal tissues, possible impacted food bolus vs retained debris.  Echo with EF 50-55%. Unable to perform MR Brain d/t unstable lead on interrogation. Orthostatic VS positive yesterday AM, but improved with 250 cc NS bolus and ted hose upon repeat orthostatics. - PT/OT recommended Home Health - Ted hose - Pain: Tylenol  q6h PRN, Voltaren  gel QID PRN - Avoid sedative medications - Fall precautions - SNF placement pending

## 2024-01-28 NOTE — Progress Notes (Signed)
 Patient making multiple attempts to get out of bed she would state she had to pee put when I would attempt to assist she would say she didn't have to go. I assissited her to bedside commode and she didn't void.Bladder scanned her for 26 mls and placed back in the bed

## 2024-01-28 NOTE — Progress Notes (Signed)
 Mobility Specialist Progress Note:   01/28/24 1142  Mobility  Activity Ambulated with assistance in room  Level of Assistance Minimal assist, patient does 75% or more  Assistive Device Front wheel walker  Distance Ambulated (ft) 3 ft  Activity Response Tolerated fair  Mobility Referral Yes  Mobility visit 1 Mobility  Mobility Specialist Start Time (ACUTE ONLY) 0930  Mobility Specialist Stop Time (ACUTE ONLY) 0955  Mobility Specialist Time Calculation (min) (ACUTE ONLY) 25 min   Pt received in chair, agreeable to mobility. MinA to stand and take a few steps from chair to bedside. Pt c/o nausea and weakness during session, initially requesting to return to bed. Once seated EOB pt c/o back pain suggesting to sit up in chair. Pt slightly confused during session needing encouragement and directional cues during ambulation. Pt returned to chair with call bell in reach and all needs met. Chair alarm on.    Brown Husband  Mobility Specialist Please contact via SecureChat Rehab office at [phone]

## 2024-01-28 NOTE — [place] FL2 (Signed)
 [place]  MEDICAID FL2 LEVEL OF CARE FORM     IDENTIFICATION  Patient Name: Judith [name] Birthdate: 11/30/1941 Sex: female Admission Date (Current Location): 01/25/2024  [facility] and [place] Number:  Producer, Television/film/video and Address:  [facility], [address], [place], [place] [address]      Provider Number: [id number]  Attending Physician Name and Address:  Delores Suzann HERO, MD  Relative Name and Phone Number:       Current Level of Care: Hospital Recommended Level of Care: Skilled Nursing Facility Prior Approval Number:    Date Approved/Denied:   PASRR Number: [id number]  Discharge Plan: SNF    Current Diagnoses: Patient Active Problem List   Diagnosis Date Noted   Protein-calorie malnutrition, severe 01/27/2024   Fall 01/25/2024   Esophageal abnormality 01/25/2024   Knee abrasion 01/25/2024   Frequent falls 01/25/2024   Goals of care, counseling/discussion 12/05/2023   Depressed mood 11/12/2023   Suicidal ideation 10/29/2023   Pulmonary nodule 06/17/2023   Pacemaker battery depletion 04/25/2023   UTI (urinary tract infection) 12/28/2022   Urinary incontinence 08/22/2022   Sleep apnea    Hyperlipidemia    Polycythemia    Unspecified inflammatory spondylopathy, cervical region (HCC) 11/15/2020   Multilevel degenerative disc disease 04/03/2018   Acute blood loss anemia 01/17/2018   Unintentional weight loss 01/17/2018   S/P partial gastrectomy    Peripheral neuropathy    Benign essential HTN    Controlled type 2 diabetes mellitus with diabetic neuropathy, with long-term current use of insulin  (HCC)    Depression    Presence of cardiac pacemaker    Gout    Cervical spine arthritis 04/05/2016   Long-term insulin  use in type 2 diabetes (HCC) 04/05/2016    Orientation RESPIRATION BLADDER Height & Weight     Self, Place  Normal Incontinent Weight: 102 lb 8.2 oz (46.5 kg) Height:  5' 3 (160 cm)  BEHAVIORAL SYMPTOMS/MOOD  NEUROLOGICAL BOWEL NUTRITION STATUS      Incontinent Diet (See DC summary)  AMBULATORY STATUS COMMUNICATION OF NEEDS Skin   Limited Assist Verbally Normal                       Personal Care Assistance Level of Assistance  Bathing, Feeding, Dressing Bathing Assistance: Limited assistance Feeding assistance: Limited assistance Dressing Assistance: Limited assistance     Functional Limitations Info  Sight, Hearing, Speech Sight Info: Adequate Hearing Info: Adequate Speech Info: Adequate    SPECIAL CARE FACTORS FREQUENCY  PT (By licensed PT), OT (By licensed OT)     PT Frequency: 5x week OT Frequency: 5x week            Contractures Contractures Info: Not present    Additional Factors Info  Code Status, Allergies, Insulin  Sliding Scale Code Status Info: Full Allergies Info: Asa (Aspirin)  Motrin (Ibuprofen)  Cipro  (Ciprofloxacin  Hcl)  Sulfa Antibiotics   Insulin  Sliding Scale Info: See DC summary       Current Medications (01/28/2024):  This is the current hospital active medication list Current Facility-Administered Medications  Medication Dose Route Frequency Provider Last Rate Last Admin   acetaminophen  (TYLENOL ) tablet 650 mg  650 mg Oral Q6H PRN Bryan Bianchi, MD   650 mg at 01/28/24 0910   Or   acetaminophen  (TYLENOL ) suppository 650 mg  650 mg Rectal Q6H PRN Bryan Bianchi, MD       cefadroxil  (DURICEF) capsule 500 mg  500 mg Oral  BID Delores Suzann HERO, MD   500 mg at 01/28/24 1029   diclofenac  Sodium (VOLTAREN ) 1 % topical gel 2 g  2 g Topical QID PRN Bryan Bianchi, MD       enoxaparin  (LOVENOX ) injection 40 mg  40 mg Subcutaneous Q24H Bryan Bianchi, MD   40 mg at 01/28/24 1208   famotidine  (PEPCID ) tablet 20 mg  20 mg Oral QHS Bryan Bianchi, MD   20 mg at 01/27/24 2101   feeding supplement (ENSURE ENLIVE / ENSURE PLUS) liquid 237 mL  237 mL Oral BID BM Delores Suzann HERO, MD   237 mL at 01/28/24 1029   insulin  aspart (novoLOG ) injection 0-15 Units  0-15  Units Subcutaneous TID WC Mahmood, Atif, MD   3 Units at 01/28/24 1213   insulin  aspart (novoLOG ) injection 0-5 Units  0-5 Units Subcutaneous QHS Romelle Booty, MD       mirtazapine  (REMERON ) tablet 7.5 mg  7.5 mg Oral QHS Theophilus Pagan, MD   7.5 mg at 01/27/24 2059   multivitamin with minerals tablet 1 tablet  1 tablet Oral Daily Bryan Bianchi, MD   1 tablet at 01/28/24 0901   oxyCODONE  (Oxy IR/ROXICODONE ) immediate release tablet 2.5 mg  2.5 mg Oral BID PRN Gomes, Adriana, DO   2.5 mg at 01/28/24 1207   polyethylene glycol (MIRALAX  / GLYCOLAX ) packet 17 g  17 g Oral Daily Bryan Bianchi, MD   17 g at 01/28/24 0901   rosuvastatin  (CRESTOR ) tablet 10 mg  10 mg Oral Daily Bryan Bianchi, MD   10 mg at 01/28/24 0900   thiamine  (VITAMIN B1) tablet 100 mg  100 mg Oral Daily Brown, Carina M, MD   100 mg at 01/28/24 0900     Discharge Medications: Please see discharge summary for a list of discharge medications.  Relevant Imaging Results:  Relevant Lab Results:   Additional Information SS# [id number]  Harlene Sharps, LCSW

## 2024-01-28 NOTE — Assessment & Plan Note (Addendum)
Wound care as indicated. Keep clean and dry.

## 2024-01-28 NOTE — Assessment & Plan Note (Addendum)
 Resolved - Possibly in the setting of UTI.  T min 94.9 rectal.  Temp now improving off Bair hugger.  97.26F this morning.  AM cortisol 18.8 consistent with normal adrenal function. - Continue to monitor temp - Monitor blood cultures, NGTD - Treatment of UTI as noted

## 2024-01-28 NOTE — Plan of Care (Signed)
  Problem: Clinical Measurements: Goal: Ability to maintain clinical measurements within normal limits will improve Outcome: Progressing Goal: Will remain free from infection Outcome: Progressing Goal: Diagnostic test results will improve Outcome: Progressing Goal: Respiratory complications will improve Outcome: Progressing Goal: Cardiovascular complication will be avoided Outcome: Progressing   Problem: Activity: Goal: Risk for activity intolerance will decrease Outcome: Progressing   Problem: Elimination: Goal: Will not experience complications related to bowel motility Outcome: Progressing Goal: Will not experience complications related to urinary retention Outcome: Progressing   Problem: Pain Managment: Goal: General experience of comfort will improve and/or be controlled Outcome: Progressing   Problem: Skin Integrity: Goal: Risk for impaired skin integrity will decrease Outcome: Progressing   Problem: Health Behavior/Discharge Planning: Goal: Ability to manage health-related needs will improve Outcome: Progressing   Problem: Skin Integrity: Goal: Risk for impaired skin integrity will decrease Outcome: Progressing   Problem: Tissue Perfusion: Goal: Adequacy of tissue perfusion will improve Outcome: Progressing

## 2024-01-28 NOTE — Progress Notes (Signed)
 Occupational Therapy Treatment Patient Details Name: Judith [name] MRN: [id number] DOB: 1941-11-12 Today's Date: 01/28/2024   History of present illness Pt is 83 yo F who presented to ED after a fall at her ALF. Found to have UTI and hypothermic. PMH includes: T2DM, multilevel DDD, peripheral neuropathy, depression, unintentional weight loss, and CAD s/p CABG and pacemaker, complete heart block.   OT comments  Patient up in recliner upon entry. Patient demonstrating good gains with sit to stands and transfers. Patient asking multiple times to go to [facility] but did not void. Sit to stands and transfers performed in room with mod assist. Patient is expected to return to ALF with HHOT to follow. Acute OT to continue to follow to address established goals to facilitate safe DC back to ALF.       If plan is discharge home, recommend the following:  A lot of help with bathing/dressing/bathroom;A lot of help with walking and/or transfers;Assistance with cooking/housework;Direct supervision/assist for medications management;Direct supervision/assist for financial management;Assist for transportation;Supervision due to cognitive status   Equipment Recommendations  Wheelchair (measurements OT);Wheelchair cushion (measurements OT)    Recommendations for Other Services      Precautions / Restrictions Precautions Precautions: Fall Restrictions Weight Bearing Restrictions Per Provider Order: No       Mobility Bed Mobility Overal bed mobility: Needs Assistance             General bed mobility comments: OOB in recliner    Transfers Overall transfer level: Needs assistance Equipment used: Rolling walker (2 wheels) Transfers: Sit to/from Stand, Bed to chair/wheelchair/BSC Sit to Stand: Mod assist     Step pivot transfers: Mod assist     General transfer comment: cues for hand placement, mod assist to power up and to perform transfers with RW due to posterior leaning     Balance Overall  balance assessment: History of Falls, Needs assistance Sitting-balance support: Bilateral upper extremity supported, Feet supported Sitting balance-Leahy Scale: Fair Sitting balance - Comments: in recliner Postural control: Posterior lean Standing balance support: Bilateral upper extremity supported, Reliant on assistive device for balance Standing balance-Leahy Scale: Poor Standing balance comment: reliant on RW for support                           ADL either performed or assessed with clinical judgement   ADL Overall ADL's : Needs assistance/impaired     Grooming: Wash/dry hands;Wash/dry face;Oral care;Brushing hair;Set up;Sitting                   Toilet Transfer: Moderate assistance;Ambulation;BSC/3in1;Rolling walker (2 wheels) Toilet Transfer Details (indicate cue type and reason): ambulated 5-6 feet to [facility] with mod assist for balance and walker management           General ADL Comments: asked to use BSC twice but did not void    Extremity/Trunk Assessment              Vision       Perception     Praxis      Cognition Arousal: Alert Behavior During Therapy: Flat affect Overall Cognitive Status: Within Functional Limits for tasks assessed                                 General Comments: asking often to stand, slow processing, increased time to follow commands        Exercises  Shoulder Instructions       General Comments asked to stand and/or go to [facility] multiple times during session    Pertinent Vitals/ Pain       Pain Assessment Pain Assessment: Faces Faces Pain Scale: Hurts a little bit Pain Location: BLE when feet elevated while seated in recliner Pain Descriptors / Indicators: Discomfort, Grimacing, Guarding Pain Intervention(s): Monitored during session, RN gave pain meds during session, Repositioned  Home Living                                          Prior Functioning/Environment               Frequency  Min 2X/week        Progress Toward Goals  OT Goals(current goals can now be found in the care plan section)  Progress towards OT goals: Progressing toward goals  Acute Rehab OT Goals OT Goal Formulation: With patient Time For Goal Achievement: 02/08/24 Potential to Achieve Goals: Good  Plan      Co-evaluation                 AM-PAC OT 6 Clicks Daily Activity     Outcome Measure   Help from another person eating meals?: None Help from another person taking care of personal grooming?: A Little Help from another person toileting, which includes using toliet, bedpan, or urinal?: A Lot Help from another person bathing (including washing, rinsing, drying)?: A Lot Help from another person to put on and taking off regular upper body clothing?: A Little Help from another person to put on and taking off regular lower body clothing?: A Lot 6 Click Score: 16    End of Session Equipment Utilized During Treatment: Gait belt;Rolling walker (2 wheels)  OT Visit Diagnosis: Unsteadiness on feet (R26.81);Other abnormalities of gait and mobility (R26.89);History of falling (Z91.81);Muscle weakness (generalized) (M62.81);Other symptoms and signs involving cognitive function;Adult, failure to thrive (R62.7)   Activity Tolerance Patient tolerated treatment well   Patient Left in chair;with call bell/phone within reach;with chair alarm set   Nurse Communication Mobility status        Time: 9140-9068 OT Time Calculation (min): 32 min  Charges: OT General Charges $OT Visit: 1 Visit OT Treatments $Self Care/Home Management : 23-37 mins  Dick Laine, OTA Acute Rehabilitation Services  Office [phone]   Jeb LITTIE Laine 01/28/2024, 11:14 AM

## 2024-01-28 NOTE — Assessment & Plan Note (Addendum)
Seen on imaging, "Extensive debris noted within the paraesophageal soft tissues at the thoracic inlet versus within the esophageal lumen itself." CXR negative, aspiration risk. SLP consulted and recommended regular thin liquid diet, no follow up needed.

## 2024-01-28 NOTE — Discharge Summary (Addendum)
 Family Medicine Teaching Judith [name] Discharge Summary  Patient name: Judith [name] Medical record number: [id number] Date of birth: 22-Jun-1941 Age: 83 y.o. Gender: female Date of Admission: 01/25/2024  Date of Discharge: 02/03/24  Admitting Physician: Judith Headland, MD  Primary Care Provider: Vallorie Gayer, DO Consultants: None  Indication for Hospitalization: Fall and Hypothermia  Discharge Diagnoses/Problem List:  Principal Problem for Admission: Fall Other Problems addressed during stay:  Principal Problem:   Fall Active Problems:   Depression   Esophageal abnormality   Knee abrasion   Frequent falls   Protein-calorie malnutrition, severe   Delirium   Acute cystitis with hematuria   Generalized weakness  Brief [name] Course:  Judith [name] is a 83 y.o.female with a history of hypertension, pulmonary nodules, sleep apnea, gastroparesis, type 2 diabetes, neuropathy, depression, history of SI passive, weight loss who was admitted to the [name] for fall. Her [name] course is detailed below:  Fall Unwitnessed at [facility] living facility.  Imaging overall without fracture or bleed.  Echo obtained and showed LVEF 50-55%, G1DD, mild MVR, severe mitral annular calcification, and mild AVR.  TIA was considered however less likely and reassuringly she has no residual neuro deficits on exam.  MRI brain was unable to be performed d/t unstable lead on pacemaker interrogation.  Orthostatic VS positive initially, but improved after small NS bolus and ted hose.  Continued Coreg  6.25 mg BID.  PT/OT recommended home health.  Esophageal abnormality Imaging revealed extensive debris noted within the paraesophageal soft tissues at the thoracic inlet versus within the esophageal lumen itself.  CXR negative, aspiration risk.  SLP consulted and recommended regular thin liquid diet, no follow up needed.   Hypothermia  Required Bair hugger in ED with hypothermia to  94.  AM Cortisol 18.8.  This improved during her hospitalization.   UTI S/p Rocephin  with transition to Cefadroxil  completed on 2/5.   Depression Started Sertraline  25 mg daily. Increase dose as able and as indicated.   Other chronic conditions were medically managed with home medications and formulary alternatives as necessary: T2DM: A1c 6.0. Elevated AM sugars, titrated SSI as indicated.  HLD: Continued Crestor  10 mg daily DDD: Chronic issue. No sign of fracture or listhesis in CT neck. Manage any pain with Tylenol  and Voltaren  gel at this time. Change Oxycodone  to 2.5 mg BID PRN at discharge d/t increased risk of drowsiness, dizziness, and respiratory depression and inc risk of falling.  Peripheral neuropathy: On Gabapentin  400 mg at bedtime at home. Pulmonary nodules: Incidental 3 mm left upper pulmonary nodule found 05/2023 will need repeat low dose CT in 6-12 months  PCP Follow-up Recommendations: Incidental 3mm pulmonary nodules noted on low dose CT in June 2024 - recommended repeat CT 6-12 months Changed oxycodone  2.5 mg BID prn for severe pain d/t side effects and risk of falls.  Titrate Sertraline  prn. Consider more formal dementia evaluation and starting Donepezil if indicated.  Disposition: SNF - [facility]  Discharge Condition: Stable  Discharge Exam:  Vitals:   02/03/24 0450 02/03/24 0735  BP: 136/83 (!) 155/81  Pulse: 80 87  Resp: 18 16  Temp: 97.8 F (36.6 C) 98.8 F (37.1 C)  SpO2: 100% 96%   General: NAD laying in bed Cardiovascular: RRR. No M/R/G Respiratory: CTAB. Normal WOB on RA.  Abdomen: Soft, non-tender, non-distended Extremities: No BLE edema, able to move all extremities Neuro: AAOx3. No focal neurological deficits.    Significant Procedures: Echo  Significant Labs  and Imaging:  No results for input(s): "WBC", "HGB", "HCT", "PLT" in the last 48 hours.  No results for input(s): "NA", "K", "CL", "CO2", "GLUCOSE", "BUN", "CREATININE",  "CALCIUM ", "MG", "PHOS", "ALKPHOS", "AST", "ALT", "ALBUMIN", "PROTEIN" in the last 48 hours.  Echo: LVEF 50-55%, G1DD, mild MVR, severe mitral annular calcification, and mild AVR.    Results/Tests Pending at Time of Discharge: None  Discharge Medications:  Allergies as of 02/03/2024       Reactions   Asa [aspirin] Other (See Comments)   Causes ulcers   Motrin [ibuprofen] Other (See Comments)   GI intolerance   Cipro  [ciprofloxacin  Hcl] Rash   Ok to use eye drop formulation   Sulfa Antibiotics Other (See Comments)   GI issues        Medication List     STOP taking these medications    celecoxib  50 MG capsule Commonly known as: CeleBREX        TAKE these medications    acetaminophen  500 MG tablet Commonly known as: TYLENOL  Take 1 tablet (500 mg total) by mouth every 6 (six) hours as needed.   carvedilol  6.25 MG tablet Commonly known as: COREG  Take 1 tablet (6.25 mg total) by mouth 2 (two) times daily with a meal. What changed:  medication strength how much to take   diclofenac  Sodium 1 % Gel Commonly known as: Voltaren  Apply 2 g topically 4 (four) times daily as needed (pain).   famotidine  20 MG tablet Commonly known as: PEPCID  Take 1 tablet (20 mg total) by mouth at bedtime. What changed: when to take this   gabapentin  100 MG capsule Commonly known as: NEURONTIN  Take 400 mg by mouth at bedtime.   methenamine  1 g tablet Commonly known as: HIPREX Take 1 g by mouth 2 (two) times daily.   metoCLOPramide  10 MG tablet Commonly known as: Reglan  Take 1 tablet (10 mg total) by mouth 4 (four) times daily -  before meals and at bedtime. Take 1 tablet 10 mg by mouth 3 times/day before meals and at bedtime prn   mirtazapine  7.5 MG tablet Commonly known as: REMERON  Take 1 tablet (7.5 mg total) by mouth at bedtime.   oxyCODONE  5 MG immediate release tablet Commonly known as: Roxicodone  Take 0.5 tablets (2.5 mg total) by mouth 2 (two) times daily as needed for  severe pain (pain score 7-10). What changed:  how much to take when to take this reasons to take this   polyethylene glycol powder 17 GM/SCOOP powder Commonly known as: GLYCOLAX /MIRALAX  Dissolve 1 capful  (17 g) in water and take by mouth daily.   rosuvastatin  10 MG tablet Commonly known as: Crestor  Take 1 tablet (10 mg total) by mouth daily.   sertraline  25 MG tablet Commonly known as: ZOLOFT  Take 1 tablet (25 mg total) by mouth daily. Start taking on: February 04, 2024        Discharge Instructions: Please refer to Patient Instructions section of EMR for full details.  Patient was counseled important signs and symptoms that should prompt return to medical care, changes in medications, dietary instructions, activity restrictions, and follow up appointments.   Follow-Up Appointments:  Contact information for follow-up providers     Judith Gayer, DO Follow up on 02/04/2024.   Specialty: Family Medicine Why: at 10:10 AM Contact information: [address] [place] [address] [phone]              Contact information for after-discharge care     Destination  [facility] Preferred SNF .   Service: Skilled Nursing Contact information: [address] [place]  [address] [phone]                     Clyda Dark, DO 02/03/2024, 1:05 PM PGY-1, [facility] Family Medicine

## 2024-01-28 NOTE — TOC Progression Note (Signed)
 Transition of Care [facility]) - Progression Note    Patient Details  Name: Judith [name] MRN: [id number] Date of Birth: May 08, 1941  [facility] CM/SW Contact  Harlene Sharps, LCSW Phone Number: 01/28/2024, 12:08 PM  Clinical Narrative:    CSW spoke with pt's dtr who noted that pt has been more dependent and confused at the ALF and she is concerned she will not be managed well at that level. CSW brought these concerns to medical team and rec has been switched to SNF. Dtr agreeable, noting pt has been before but could not remember where. Dtr agreeable for a fax out for SNF options. CSW to complete workup, TOC will continue to follow for DC needs.    Expected Discharge Plan: Assisted Living Barriers to Discharge: Continued Medical Work up  Expected Discharge Plan and Services In-house Referral: Clinical Social Work Discharge Planning Services: CM Consult Post Acute Care Choice: Resumption of Svcs/PTA Provider, Home Health Living arrangements for the past 2 months: Assisted Living Facility                                       Social Determinants of Health (SDOH) Interventions SDOH Screenings   Food Insecurity: No Food Insecurity (01/25/2024)  Housing: Low Risk  (01/25/2024)  Transportation Needs: No Transportation Needs (01/25/2024)  Utilities: Not At Risk (01/25/2024)  Alcohol Screen: Low Risk  (09/27/2023)  Depression (PHQ2-9): Low Risk  (12/02/2023)  Financial Resource Strain: Low Risk  (10/19/2023)  Physical Activity: Unknown (10/19/2023)  Recent Concern: Physical Activity - Inactive (09/27/2023)  Social Connections: Socially Isolated (01/25/2024)  Stress: Patient Declined (10/19/2023)  Tobacco Use: Low Risk  (01/25/2024)  Health Literacy: Adequate Health Literacy (09/27/2023)    Readmission Risk Interventions     No data to display

## 2024-01-28 NOTE — Assessment & Plan Note (Addendum)
UA with leuks, nitrites, bacteria.  Urine culture 1/30 showed pansensitive E. coli. Received ceftriaxone on admission on 2/1. - Cefadroxil BID (2/2-2/5)

## 2024-01-28 NOTE — Progress Notes (Signed)
Md Georg Ruddle was notified via chat that patient is making several attempts to get OOB and is at risk for falling I

## 2024-01-28 NOTE — Assessment & Plan Note (Addendum)
RD consulted, appreciate recommendations.

## 2024-01-29 ENCOUNTER — Other Ambulatory Visit (HOSPITAL_COMMUNITY): Payer: Self-pay

## 2024-01-29 DIAGNOSIS — R410 Disorientation, unspecified: Secondary | ICD-10-CM | POA: Diagnosis not present

## 2024-01-29 LAB — CBC
HCT: 36.4 % (ref 36.0–46.0)
Hemoglobin: 11.9 g/dL — ABNORMAL LOW (ref 12.0–15.0)
MCH: 32.6 pg (ref 26.0–34.0)
MCHC: 32.7 g/dL (ref 30.0–36.0)
MCV: 99.7 fL (ref 80.0–100.0)
Platelets: 330 10*3/uL (ref 150–400)
RBC: 3.65 MIL/uL — ABNORMAL LOW (ref 3.87–5.11)
RDW: 14.2 % (ref 11.5–15.5)
WBC: 10 10*3/uL (ref 4.0–10.5)
nRBC: 0 % (ref 0.0–0.2)

## 2024-01-29 LAB — RPR: RPR Ser Ql: NONREACTIVE

## 2024-01-29 LAB — GLUCOSE, CAPILLARY
Glucose-Capillary: 157 mg/dL — ABNORMAL HIGH (ref 70–99)
Glucose-Capillary: 222 mg/dL — ABNORMAL HIGH (ref 70–99)
Glucose-Capillary: 171 mg/dL — ABNORMAL HIGH (ref 70–99)
Glucose-Capillary: 161 mg/dL — ABNORMAL HIGH (ref 70–99)

## 2024-01-29 MED ORDER — SERTRALINE HCL 50 MG PO TABS
25.0000 mg | ORAL_TABLET | Freq: Every day | ORAL | Status: DC
Start: 2024-01-29 — End: 2024-02-03
  Administered 2024-01-29 – 2024-02-03 (×6): 25 mg via ORAL
  Filled 2024-01-29 (×6): qty 1

## 2024-01-29 NOTE — Assessment & Plan Note (Addendum)
 UA with leuks, nitrites, bacteria.  Urine culture 1/30 showed pansensitive E. coli. Received ceftriaxone on admission on 2/1. - Cefadroxil BID (2/2-2/5)

## 2024-01-29 NOTE — Assessment & Plan Note (Addendum)
 Wound care as indicated. Keep clean and dry.

## 2024-01-29 NOTE — Progress Notes (Signed)
 Physical Therapy Treatment Patient Details Name: Judith [name] MRN: [id number] DOB: 07-01-1941 Today's Date: 01/29/2024   History of Present Illness Pt is 83 yo F who presented to ED after a fall at her ALF. Found to have UTI and hypothermic. PMH includes: T2DM, multilevel DDD, peripheral neuropathy, depression, unintentional weight loss, and CAD s/p CABG and pacemaker, complete heart block.    PT Comments  Pt A&O to self only and requires step by step cueing and hand over hand facilitation for ADL tasks with toileting and washing hands at sink. Pt requiring min assist for transfers to standing and ambulating 40 ft with walker. Consistent hands on assist for balance and steering walker. Patient will benefit from continued inpatient follow up therapy, <3 hours/day.    If plan is discharge home, recommend the following: A little help with walking and/or transfers;A little help with bathing/dressing/bathroom;Assistance with cooking/housework;Assist for transportation;Help with stairs or ramp for entrance;Direct supervision/assist for medications management   Can travel by private vehicle     Yes  Equipment Recommendations  None recommended by PT    Recommendations for Other Services       Precautions / Restrictions Precautions Precautions: Fall Restrictions Weight Bearing Restrictions Per Provider Order: No     Mobility  Bed Mobility Overal bed mobility: Needs Assistance Bed Mobility: Supine to Sit, Sit to Supine     Supine to sit: Min assist Sit to supine: Mod assist   General bed mobility comments: Assist to initiate transition to edge of bed. BLE elevation back into bed at end of session    Transfers Overall transfer level: Needs assistance Equipment used: Rolling walker (2 wheels) Transfers: Sit to/from Stand Sit to Stand: Min assist           General transfer comment: MinA to rise to standing position from edge of bed and toilet, pt utilizing narrow BOS     Ambulation/Gait Ambulation/Gait assistance: Min assist Gait Distance (Feet): 40 Feet Assistive device: Rolling walker (2 wheels) Gait Pattern/deviations: Step-through pattern, Decreased stride length, Narrow base of support Gait velocity: decreased Gait velocity interpretation: <1.31 ft/sec, indicative of household ambulator   General Gait Details: Pt requiring consistent tactile/verbal cueing for stepping initiation, anterior/forward momentum, and steering of RW.   Stairs             Wheelchair Mobility     Tilt Bed    Modified Rankin (Stroke Patients Only)       Balance Overall balance assessment: History of Falls, Needs assistance Sitting-balance support: Bilateral upper extremity supported, Feet supported Sitting balance-Leahy Scale: Fair Sitting balance - Comments: in recline   Standing balance support: Bilateral upper extremity supported, Reliant on assistive device for balance Standing balance-Leahy Scale: Poor Standing balance comment: reliant on RW for support                            Cognition Arousal: Alert Behavior During Therapy: Flat affect Overall Cognitive Status: Within Functional Limits for tasks assessed                                 General Comments: A&O to self only; step by step cues and hand over hand guidance for ADL tasks        Exercises      General Comments        Pertinent Vitals/Pain Pain Assessment Pain Assessment: Faces Faces  Pain Scale: Hurts a little bit Pain Location: IV site Pain Descriptors / Indicators: Discomfort Pain Intervention(s): Monitored during session    Home Living                          Prior Function            PT Goals (current goals can now be found in the care plan section) Acute Rehab PT Goals Potential to Achieve Goals: Fair Progress towards PT goals: Progressing toward goals    Frequency    Min 1X/week      PT Plan       Co-evaluation              AM-PAC PT 6 Clicks Mobility   Outcome Measure  Help needed turning from your back to your side while in a flat bed without using bedrails?: A Little Help needed moving from lying on your back to sitting on the side of a flat bed without using bedrails?: A Little Help needed moving to and from a bed to a chair (including a wheelchair)?: A Little Help needed standing up from a chair using your arms (e.g., wheelchair or bedside chair)?: A Little Help needed to walk in hospital room?: A Little Help needed climbing 3-5 steps with a railing? : Total 6 Click Score: 16    End of Session Equipment Utilized During Treatment: Gait belt Activity Tolerance: Patient tolerated treatment well Patient left: in bed;with call bell/phone within reach;with bed alarm set;with nursing/sitter in room   PT Visit Diagnosis: Unsteadiness on feet (R26.81);History of falling (Z91.81)     Time: 8972-8950 PT Time Calculation (min) (ACUTE ONLY): 22 min  Charges:    $Therapeutic Activity: 8-22 mins PT General Charges $$ ACUTE PT VISIT: 1 Visit                     Aleck [name], PT, DPT Acute Rehabilitation Services Office [phone]    Judith [name] 01/29/2024, 11:35 AM

## 2024-01-29 NOTE — Plan of Care (Signed)

## 2024-01-29 NOTE — TOC Progression Note (Signed)
 Transition of Care [facility]) - Progression Note    Patient Details  Name: Judith [name] MRN: [id number] Date of Birth: 02/11/1941  [facility]) CM/SW Contact  Harlene Sharps, LCSW Phone Number: 01/29/2024, 10:53 AM  Clinical Narrative:    CSW spoke with pt's dtr and provided bed offers with Medicare list. CSW advised that pt continues with a sitter so DC is still unknown. Dtr noted understanding and will review offers. Dtr will follow up with CSW with choice, but notes pt has been to [place] before. TOC will continue to follow.    Expected Discharge Plan: Assisted Living Barriers to Discharge: Continued Medical Work up  Expected Discharge Plan and Services In-house Referral: Clinical Social Work Discharge Planning Services: CM Consult Post Acute Care Choice: Resumption of Svcs/PTA Provider, Home Health Living arrangements for the past 2 months: Assisted Living Facility                                       Social Determinants of Health (SDOH) Interventions SDOH Screenings   Food Insecurity: No Food Insecurity (01/25/2024)  Housing: Low Risk  (01/25/2024)  Transportation Needs: No Transportation Needs (01/25/2024)  Utilities: Not At Risk (01/25/2024)  Alcohol Screen: Low Risk  (09/27/2023)  Depression (PHQ2-9): Low Risk  (12/02/2023)  Financial Resource Strain: Low Risk  (10/19/2023)  Physical Activity: Unknown (10/19/2023)  Recent Concern: Physical Activity - Inactive (09/27/2023)  Social Connections: Socially Isolated (01/25/2024)  Stress: Patient Declined (10/19/2023)  Tobacco Use: Low Risk  (01/25/2024)  Health Literacy: Adequate Health Literacy (09/27/2023)    Readmission Risk Interventions     No data to display

## 2024-01-29 NOTE — Plan of Care (Addendum)
 Attempted to contact patient's daughter [name] at number provided in the chart but unable to reach her.  Left HIPAA compliant voicemail.  Plan to discuss patient's ongoing memory changes, depression and SNF placement further as able.  Will plan to call back in the morning to discuss further.  Addendum 1720: Spoke with daughter [name]. Reports her mom has been losing weight and falling more. Expressed will not to live due to severe leg pain. Started on Oxy and feel it had improved but now just continuing to decline. Reports she will not walk and unsure if she is choosing not to walk but not able to. Now seems more confused and getting up constantly which is abnormal.   States she will not be able to go back [place]. Discussed unclear cause of confusion but this has not been usual for her.  Medical workup thus far negative including lab work and CT head, unfortunately and patient unable to get MRI due to pacemaker.  Daughter feels her depression is likely significantly impacting care and could be contributing.  As a result of the conversation: Start sertraline  25 mg for depression management.  Told daughter this may take time to take effect. Continue delirium precautions and assistance with meals. Continue sitter, daughter unable to provide full-time support at this time as she is recovering from surgery and there is no other family available.  Will discuss further tomorrow but eventual goal of removing the sitter so patient could potentially go to SNF.  Patient presentation most likely multifactorial in the setting of depression, hospital delirium and possible progression of underlying dementia.  Discussed presentation may be natural decline.  Theophilus Pagan, MD 01/29/2024, 5:14 PM PGY-2, [facility] Service pager [phone]

## 2024-01-29 NOTE — Plan of Care (Signed)
  Problem: Clinical Measurements: Goal: Will remain free from infection Outcome: Progressing   Problem: Nutrition: Goal: Adequate nutrition will be maintained Outcome: Progressing   Problem: Elimination: Goal: Will not experience complications related to bowel motility Outcome: Progressing   Problem: Pain Managment: Goal: General experience of comfort will improve and/or be controlled Outcome: Progressing

## 2024-01-29 NOTE — Progress Notes (Signed)
Vital signs not done due to patient is finally sound asleep she has had maybe a total of 4 hrs of good sleep

## 2024-01-29 NOTE — Progress Notes (Addendum)
 Daily Progress Note Intern Pager: [phone]  Patient name: Judith [name] Medical record number: [id number] Date of birth: 1941/03/08 Age: 83 y.o. Gender: female  Primary Care Provider: Dartha Geralds, DO Consultants: None Code Status: Full Code  Pt Overview and Major Events to Date:  2/1: Admitted  Assessment and Plan: AYUSHI PLA is a 83 y.o. female with past medical history of T2DM, degenerative disease, peripheral neuropathy, depression, and unintentional weight loss presenting with fall, found to be hypothermic in the setting of likely UTI.    Initially patient was going to go back to her ALF, however per family (who report that she isn't at her baseline - normally independent w/ ADLs at baseline) and PT/OT SNF has been recommended.  Pending SNF placement.  Assessment & Plan Fall Unclear etiology.  Does have a Hx of falls. CT head negative for acute intracranial abnormality and fracture. Mild R infraorbital soft tissue swelling noted. CT neck negative for fracture or listhesis but did demonstrate extensive debris noted in the paraesophageal tissues, possible impacted food bolus vs retained debris.  Echo with EF 50-55%. Unable to perform MR Brain d/t incompatible lead (ventricular lead). Orthostatic VS previously, but improved with 250 cc NS bolus and ted hose upon repeat orthostatics. 3+ pitting edema seen on exam, likely secondary to stasis.  - PT/OT recommended Home Health - Ted hose - Pain: Tylenol  q6h PRN, Voltaren  gel QID PRN - Avoid sedative medications - Fall precautions - SNF placement pending UTI (urinary tract infection) UA with leuks, nitrites, bacteria.  Urine culture 1/30 showed pansensitive E. coli. Received ceftriaxone  on admission on 2/1. - Cefadroxil  BID (2/2-2/5) Delirium Intermittent confusion and impulsivity leading to falls. This is likely the cause of her falls at her ALF.  Patient constantly has been trying to get OOB which increases the risk of  falling.  A 1:1 sitter was placed yesterday.  Patient seems to be doing better with a sitter in the room and is redirectable, may be beneficial if her daughter can come and be with her here, will discuss with her daughter further.  There are some concern for hospital delirium vs worsening memory impairment likely 2/2 vascular dementia. - Continue 1:1 sitter - Delirium precautions - Will discuss history with daughter and consider modifying medications for sleep (Doxepin instead of Mirtazapine ) and possibly treating depression w/ Sertraline  to help. - Avoid Qtc prolonging medications and reduce meds that can could worsen delirium Esophageal abnormality Seen on imaging, Extensive debris noted within the paraesophageal soft tissues at the thoracic inlet versus within the esophageal lumen itself. CXR negative, aspiration risk. SLP consulted and recommended regular thin liquid diet, no follow up needed.  Protein-calorie malnutrition, severe RD consulted, appreciate recommendations. - Ensure full supervision for meals/snacks - PO fluids 250 cc q4h Knee abrasion  Wound care as indicated. Keep clean and dry.   Chronic and Stable Problems: T2DM: Sugars 191-210.  mSSI HTN: On Coreg  12.5 mg BID at home. Will likely hold upon discharge HLD: Continue Crestor  10 mg daily DDD: Chronic issue. No sign of fracture or listhesis in CT neck. Manage any pain with Tylenol  and Voltaren  gel at this time. Will hold Oxycodone  at discharge.  Peripheral neuropathy: On Gabapentin  100 mg in AM, PM, and 400 mg at bedtime, restart when able to verify medications  Depression: On Cymbalta  60 mg daily  Complete Heart Block with pacemaker: PPM interrogation which demonstrated an unsafe lead for MRI  Pulmonary nodules: Incidental 3 mm left upper  pulmonary nodule found 05/2023 will need repeat low dose CT in 6-12 months, will put in follow up instructions in discharge summary  Sleep Apnea: Not on CPAP Gastroparesis: Hx of partial  gastrectomy    FEN/GI: Regular Diet PPx: Lovenox  Dispo: SNF pending placement.   Subjective:  Patient says she didn't get much rest and isn't feeling well. She complains of some abdominal pain today. Seems as if she really wants the company of another person in the room and wants to be more active but is still having difficulty doing so herself.   Objective: Temp:  [97.4 F (36.3 C)-97.5 F (36.4 C)] 97.4 F (36.3 C) (02/05 0740) Pulse Rate:  [89] 89 (02/04 2000) Resp:  [16] 16 (02/05 0740) BP: (143-152)/(72-77) 152/77 (02/05 0740) SpO2:  [98 %-100 %] 100 % (02/05 0740) Physical Exam: General: Sitting in bedside chair comfortably.  Cardiovascular: RRR. No M/R/G Respiratory: CTAB. Normal WOB on RA.  Abdomen: Soft, non-tender, non-distended. Normoactive bowel sounds.  Extremities: 3+ BLE edema 2/2 stasis, able to move all extremities. Neuro: Alert and oriented to self, location, and year. No focal neurological deficits noted.   Laboratory: Most recent CBC Lab Results  Component Value Date   WBC 10.0 01/29/2024   HGB 11.9 (L) 01/29/2024   HCT 36.4 01/29/2024   MCV 99.7 01/29/2024   PLT 330 01/29/2024   Most recent BMP    Latest Ref Rng & Units 01/25/2024    5:35 AM  BMP  Glucose 70 - 99 mg/dL 885   BUN 8 - 23 mg/dL 16   Creatinine 9.55 - 1.00 mg/dL 9.20   Sodium 864 - 854 mmol/L 136   Potassium 3.5 - 5.1 mmol/L 4.3   Chloride 98 - 111 mmol/L 107   CO2 22 - 32 mmol/L 20   Calcium  8.9 - 10.3 mg/dL 8.4     Imaging/Diagnostic Tests: No new imaging.  Janna Ferrier, DO 01/29/2024, 2:12 PM  PGY-1, [facility] FPTS Intern pager: [phone], text pages welcome Secure chat group [facility] Teaching Service

## 2024-01-29 NOTE — Assessment & Plan Note (Addendum)
 Intermittent confusion and impulsivity leading to falls. This is likely the cause of her falls at her ALF.  Patient constantly has been trying to get OOB which increases the risk of falling.  A 1:1 sitter was placed yesterday.  Patient seems to be doing better with a sitter in the room and is redirectable, may be beneficial if her daughter can come and be with her here, will discuss with her daughter further.  There are some concern for hospital delirium vs worsening memory impairment likely 2/2 vascular dementia. - Continue 1:1 sitter - Delirium precautions - Will discuss history with daughter and consider modifying medications for sleep (Doxepin instead of Mirtazapine ) and possibly treating depression w/ Sertraline  to help. - Avoid Qtc prolonging medications and reduce meds that can could worsen delirium

## 2024-01-29 NOTE — Assessment & Plan Note (Addendum)
 Seen on imaging, "Extensive debris noted within the paraesophageal soft tissues at the thoracic inlet versus within the esophageal lumen itself." CXR negative, aspiration risk. SLP consulted and recommended regular thin liquid diet, no follow up needed.

## 2024-01-29 NOTE — Assessment & Plan Note (Addendum)
 Unclear etiology.  Does have a Hx of falls. CT head negative for acute intracranial abnormality and fracture. Mild R infraorbital soft tissue swelling noted. CT neck negative for fracture or listhesis but did demonstrate extensive debris noted in the paraesophageal tissues, possible impacted food bolus vs retained debris.  Echo with EF 50-55%. Unable to perform MR Brain d/t incompatible lead (ventricular lead). Orthostatic VS previously, but improved with 250 cc NS bolus and ted hose upon repeat orthostatics. 3+ pitting edema seen on exam, likely secondary to stasis.  - PT/OT recommended Home Health - Ted hose - Pain: Tylenol  q6h PRN, Voltaren  gel QID PRN - Avoid sedative medications - Fall precautions - SNF placement pending

## 2024-01-29 NOTE — Assessment & Plan Note (Addendum)
 RD consulted, appreciate recommendations. - Ensure full supervision for meals/snacks - PO fluids 250 cc q4h

## 2024-01-30 ENCOUNTER — Other Ambulatory Visit (HOSPITAL_COMMUNITY): Payer: Self-pay

## 2024-01-30 DIAGNOSIS — F329 Major depressive disorder, single episode, unspecified: Secondary | ICD-10-CM

## 2024-01-30 DIAGNOSIS — N3001 Acute cystitis with hematuria: Secondary | ICD-10-CM

## 2024-01-30 DIAGNOSIS — E43 Unspecified severe protein-calorie malnutrition: Secondary | ICD-10-CM

## 2024-01-30 DIAGNOSIS — R296 Repeated falls: Secondary | ICD-10-CM | POA: Diagnosis not present

## 2024-01-30 LAB — BASIC METABOLIC PANEL WITH GFR
Anion gap: 12 (ref 5–15)
BUN: 12 mg/dL (ref 8–23)
CO2: 23 mmol/L (ref 22–32)
Calcium: 8.7 mg/dL — ABNORMAL LOW (ref 8.9–10.3)
Chloride: 101 mmol/L (ref 98–111)
Creatinine, Ser: 0.81 mg/dL (ref 0.44–1.00)
GFR, Estimated: >60 mL/min
Glucose, Bld: 139 mg/dL — ABNORMAL HIGH (ref 70–99)
Potassium: 3.9 mmol/L (ref 3.5–5.1)
Sodium: 136 mmol/L (ref 135–145)

## 2024-01-30 LAB — CULTURE, BLOOD (ROUTINE X 2)
Culture: NO GROWTH
Culture: NO GROWTH

## 2024-01-30 LAB — CBC
HCT: 38.4 % (ref 36.0–46.0)
Hemoglobin: 12.8 g/dL (ref 12.0–15.0)
MCH: 33.1 pg (ref 26.0–34.0)
MCHC: 33.3 g/dL (ref 30.0–36.0)
MCV: 99.2 fL (ref 80.0–100.0)
Platelets: 340 10*3/uL (ref 150–400)
RBC: 3.87 MIL/uL (ref 3.87–5.11)
RDW: 14.3 % (ref 11.5–15.5)
WBC: 9.5 10*3/uL (ref 4.0–10.5)
nRBC: 0 % (ref 0.0–0.2)

## 2024-01-30 LAB — GLUCOSE, CAPILLARY
Glucose-Capillary: 204 mg/dL — ABNORMAL HIGH (ref 70–99)
Glucose-Capillary: 217 mg/dL — ABNORMAL HIGH (ref 70–99)
Glucose-Capillary: 208 mg/dL — ABNORMAL HIGH (ref 70–99)
Glucose-Capillary: 63 mg/dL — ABNORMAL LOW (ref 70–99)
Glucose-Capillary: 103 mg/dL — ABNORMAL HIGH (ref 70–99)

## 2024-01-30 NOTE — Assessment & Plan Note (Addendum)
 Extensive debris noted within the paraesophageal soft tissues at the thoracic inlet versus within the esophageal lumen itself on imaging. CXR negative, aspiration risk. SLP consulted and recommended regular thin liquid diet, no follow up needed.

## 2024-01-30 NOTE — Assessment & Plan Note (Addendum)
 UA with leuks, nitrites, bacteria.  Urine culture 1/30 showed pansensitive E. coli. Received ceftriaxone  on admission on 2/1. - Completed Cefadroxil  BID (2/2-2/5)

## 2024-01-30 NOTE — Assessment & Plan Note (Signed)
 Depression is likely significantly impacting her care and could be contributing to her decline.  - Sertraline  25 mg daily

## 2024-01-30 NOTE — TOC Progression Note (Signed)
 Transition of Care [facility]) - Progression Note    Patient Details  Name: [name] MRN: [id number] Date of Birth: [birth year]  Transition [facility]) CM/SW Contact  Harlene Sharps, LCSW Phone Number: 01/30/2024, 1:00 PM  Clinical Narrative:     CSW spoke with pt's dtr and confirmed they would like [facility] at DC. Pt continues with sitter, primary DC delay. Medical team aware and working on it. CSW updated [facility] who is agreeable to accept pt at DC. Pt will need an insurance auth when closer to DC. TOC will continue to follow.   Expected Discharge Plan: Assisted Living Barriers to Discharge: Continued Medical Work up  Expected Discharge Plan and Services In-house Referral: Clinical Social Work Discharge Planning Services: CM Consult Post Acute Care Choice: Resumption of Svcs/PTA Provider, Home Health Living arrangements for the past 2 months: Assisted Living Facility                                       Social Determinants of Health (SDOH) Interventions SDOH Screenings   Food Insecurity: No Food Insecurity (01/25/2024)  Housing: Low Risk  (01/25/2024)  Transportation Needs: No Transportation Needs (01/25/2024)  Utilities: Not At Risk (01/25/2024)  Alcohol Screen: Low Risk  (09/27/2023)  Depression (PHQ2-9): Low Risk  (12/02/2023)  Financial Resource Strain: Low Risk  (10/19/2023)  Physical Activity: Unknown (10/19/2023)  Recent Concern: Physical Activity - Inactive (09/27/2023)  Social Connections: Socially Isolated (01/25/2024)  Stress: Patient Declined (10/19/2023)  Tobacco Use: Low Risk  (01/25/2024)  Health Literacy: Adequate Health Literacy (09/27/2023)    Readmission Risk Interventions     No data to display

## 2024-01-30 NOTE — Plan of Care (Signed)

## 2024-01-30 NOTE — Assessment & Plan Note (Addendum)
 RD consulted, appreciate recommendations. - Ensure full supervision for meals/snacks - PO fluids 250 cc q4h

## 2024-01-30 NOTE — Progress Notes (Addendum)
 Mobility Specialist Progress Note:   01/30/24 1527  Mobility  Activity Ambulated with assistance in room  Level of Assistance Minimal assist, patient does 75% or more  Assistive Device Front wheel walker  Distance Ambulated (ft) 30 ft  Activity Response Tolerated well  Mobility Referral Yes  Mobility visit 1 Mobility  Mobility Specialist Start Time (ACUTE ONLY) 1153  Mobility Specialist Stop Time (ACUTE ONLY) 1203  Mobility Specialist Time Calculation (min) (ACUTE ONLY) 10 min   Pt received in chair, agreeable to mobility with encouragement. Sitter present in room. MinA to stand and ambulate to door. Pt slow to commands but seems more responsive compared to previous session. Pt c/o nausea and lower back pain requesting to return back to bed. Pt left EOB as requested with all needs met. Sitter still present in room.   Brown Husband  Mobility Specialist Please contact via [employer] office at [phone]

## 2024-01-30 NOTE — Assessment & Plan Note (Addendum)
 Intermittent confusion and impulsivity leading to falls. This is likely the cause of her falls at her ALF.  Patient constantly has been trying to get OOB which increases the risk of falling.  Required a 1:1 sitter.  Patient seems to be doing better with a sitter in the room and is redirectable.  Unfortunately her daughter will be unable to provide full-time support since she is recovering from surgery.  There are some concern for hospital delirium vs worsening memory impairment likely 2/2 vascular dementia. - Trial off 1:1 sitter as of 8:45 AM - Delirium precautions - Avoid Qtc prolonging medications and reduce meds that can could worsen delirium

## 2024-01-30 NOTE — Progress Notes (Addendum)
 Daily Progress Note Intern Pager: [phone]  Patient name: Judith [name] Medical record number: [id number] Date of birth: 01-31-1941 Age: 83 y.o. Gender: female  Primary Care Provider: Dartha Geralds, DO Consultants: None Code Status: Full Code  Pt Overview and Major Events to Date:  2/1: Admitted  Assessment and Plan: Judith [name] is a 83 y.o. female with past medical history of T2DM, degenerative disease, peripheral neuropathy, depression, and unintentional weight loss presenting with fall, found to be hypothermic in the setting of likely UTI.    Initially patient was going to go back to her ALF, however per family (who report that she isn't at her baseline - normally independent w/ ADLs at baseline) and PT/OT SNF has been recommended.  Pending SNF placement.  Assessment & Plan Fall Unclear etiology.  Does have a Hx of falls. CT head negative for acute intracranial abnormality and fracture. Mild R infraorbital soft tissue swelling noted. CT neck negative for fracture or listhesis but did demonstrate extensive debris noted in the paraesophageal tissues, possible impacted food bolus vs retained debris.  Echo with EF 50-55%. Unable to perform MR Brain d/t incompatible lead (ventricular lead). Orthostatic VS previously, but improved with 250 cc NS bolus and ted hose upon repeat orthostatics. 3+ pitting edema seen on exam, likely secondary to stasis.  - PT/OT recommended Home Health - Ted hose - Pain: Tylenol  q6h PRN, Voltaren  gel QID PRN - Avoid sedative medications - Fall precautions - SNF placement pending Delirium Intermittent confusion and impulsivity leading to falls. This is likely the cause of her falls at her ALF.  Patient constantly has been trying to get OOB which increases the risk of falling.  Required a 1:1 sitter.  Patient seems to be doing better with a sitter in the room and is redirectable.  Unfortunately her daughter will be unable to provide full-time support  since she is recovering from surgery.  There are some concern for hospital delirium vs worsening memory impairment likely 2/2 vascular dementia. - Trial off 1:1 sitter as of 8:45 AM - Delirium precautions - Avoid Qtc prolonging medications and reduce meds that can could worsen delirium Depression Depression is likely significantly impacting her care and could be contributing to her decline.  - Sertraline  25 mg daily Esophageal abnormality Extensive debris noted within the paraesophageal soft tissues at the thoracic inlet versus within the esophageal lumen itself on imaging. CXR negative, aspiration risk. SLP consulted and recommended regular thin liquid diet, no follow up needed.  Protein-calorie malnutrition, severe RD consulted, appreciate recommendations. - Ensure full supervision for meals/snacks - PO fluids 250 cc q4h Knee abrasion  Wound care as indicated. Keep clean and dry.  UTI (urinary tract infection) (Resolved: 01/30/2024) UA with leuks, nitrites, bacteria.  Urine culture 1/30 showed pansensitive E. coli. Received ceftriaxone  on admission on 2/1. - Completed Cefadroxil  BID (2/2-2/5)  Chronic and Stable Problems: T2DM: Sugars 130-210s.  mSSI HTN: On Coreg  12.5 mg BID at home. Will monitor BP and restart as indicated. HLD: Continue Crestor  10 mg daily DDD: Chronic issue. No sign of fracture or listhesis in CT neck. Manage any pain with Tylenol  and Voltaren  gel at this time. Will hold Oxycodone  at discharge.  Peripheral neuropathy: On Gabapentin  100 mg in AM, PM, and 400 mg at bedtime, restart when able to verify medications  Depression: On Cymbalta  60 mg daily  Complete Heart Block with pacemaker: PPM interrogation which demonstrated an unsafe lead for MRI  Pulmonary nodules: Incidental 3 mm  left upper pulmonary nodule found 05/2023 will need repeat low dose CT in 6-12 months, will put in follow up instructions in discharge summary  Sleep Apnea: Not on CPAP Gastroparesis: Hx of  partial gastrectomy    FEN/GI: Regular Diet PPx: Lovenox  Dispo: SNF pending placement.   Subjective:  Patient reports she feels the same as yesterday terrible. Pain has remains similar but on exam seems improved. She seems lonely but is doing better today mentally and is able to answer all my questions with ease.   Objective: Temp:  [97.5 F (36.4 C)-98.3 F (36.8 C)] 97.5 F (36.4 C) (02/06 1113) Pulse Rate:  [88-108] 108 (02/06 1113) Resp:  [16-17] 16 (02/06 1113) BP: (137-172)/(73-96) 137/96 (02/06 1113) SpO2:  [95 %-97 %] 96 % (02/06 1113) Physical Exam: General: Sitting in bedside chair comfortably.  Cardiovascular: RRR. No M/R/G Respiratory: CTAB. Normal WOB on RA.  Abdomen: Soft, non-tender, non-distended. Normoactive bowel sounds.  Extremities: 3+ BLE edema 2/2 stasis, able to move all extremities. Neuro: Alert and oriented to self, location, and year. No focal neurological deficits noted.  Laboratory: Most recent CBC Lab Results  Component Value Date   WBC 9.5 01/30/2024   HGB 12.8 01/30/2024   HCT 38.4 01/30/2024   MCV 99.2 01/30/2024   PLT 340 01/30/2024   Most recent BMP    Latest Ref Rng & Units 01/30/2024    7:07 AM  BMP  Glucose 70 - 99 mg/dL 860   BUN 8 - 23 mg/dL 12   Creatinine 9.55 - 1.00 mg/dL 9.18   Sodium 864 - 854 mmol/L 136   Potassium 3.5 - 5.1 mmol/L 3.9   Chloride 98 - 111 mmol/L 101   CO2 22 - 32 mmol/L 23   Calcium  8.9 - 10.3 mg/dL 8.7    Imaging/Diagnostic Tests: No new imaging.  Janna Ferrier, DO 01/30/2024, 4:33 PM  PGY-1, [facility] FPTS Intern pager: [phone], text pages welcome Secure chat group [facility] Teaching Service

## 2024-01-30 NOTE — Assessment & Plan Note (Addendum)
 Unclear etiology.  Does have a Hx of falls. CT head negative for acute intracranial abnormality and fracture. Mild R infraorbital soft tissue swelling noted. CT neck negative for fracture or listhesis but did demonstrate extensive debris noted in the paraesophageal tissues, possible impacted food bolus vs retained debris.  Echo with EF 50-55%. Unable to perform MR Brain d/t incompatible lead (ventricular lead). Orthostatic VS previously, but improved with 250 cc NS bolus and ted hose upon repeat orthostatics. 3+ pitting edema seen on exam, likely secondary to stasis.  - PT/OT recommended Home Health - Ted hose - Pain: Tylenol  q6h PRN, Voltaren  gel QID PRN - Avoid sedative medications - Fall precautions - SNF placement pending

## 2024-01-30 NOTE — Assessment & Plan Note (Addendum)
 Wound care as indicated. Keep clean and dry.

## 2024-01-31 ENCOUNTER — Other Ambulatory Visit (HOSPITAL_COMMUNITY): Payer: Self-pay

## 2024-01-31 DIAGNOSIS — E1140 Type 2 diabetes mellitus with diabetic neuropathy, unspecified: Secondary | ICD-10-CM

## 2024-01-31 DIAGNOSIS — R296 Repeated falls: Secondary | ICD-10-CM | POA: Diagnosis not present

## 2024-01-31 DIAGNOSIS — R531 Weakness: Secondary | ICD-10-CM

## 2024-01-31 DIAGNOSIS — F329 Major depressive disorder, single episode, unspecified: Secondary | ICD-10-CM | POA: Diagnosis not present

## 2024-01-31 DIAGNOSIS — Z794 Long term (current) use of insulin: Secondary | ICD-10-CM

## 2024-01-31 LAB — GLUCOSE, CAPILLARY
Glucose-Capillary: 52 mg/dL — ABNORMAL LOW (ref 70–99)
Glucose-Capillary: 145 mg/dL — ABNORMAL HIGH (ref 70–99)
Glucose-Capillary: 111 mg/dL — ABNORMAL HIGH (ref 70–99)
Glucose-Capillary: 202 mg/dL — ABNORMAL HIGH (ref 70–99)
Glucose-Capillary: 224 mg/dL — ABNORMAL HIGH (ref 70–99)
Glucose-Capillary: 314 mg/dL — ABNORMAL HIGH (ref 70–99)

## 2024-01-31 MED ORDER — LIDOCAINE 5 % EX PTCH
2.0000 | MEDICATED_PATCH | CUTANEOUS | Status: DC
  Administered 2024-01-31 – 2024-02-02 (×3): 2 via TRANSDERMAL
  Filled 2024-01-31 (×4): qty 2

## 2024-01-31 MED ORDER — INSULIN ASPART 100 UNIT/ML IJ SOLN: 0.0000 [IU] | Freq: Three times a day (TID) | INTRAMUSCULAR | Status: DC

## 2024-01-31 MED ORDER — OXYCODONE HCL 5 MG PO TABS
2.5000 mg | ORAL_TABLET | Freq: Three times a day (TID) | ORAL | Status: DC | PRN
  Administered 2024-01-31 – 2024-02-01 (×3): 2.5 mg via ORAL
  Filled 2024-01-31 (×3): qty 1

## 2024-01-31 MED ORDER — DEXTROSE 50 % IV SOLN: 1.0000 | INTRAVENOUS | Status: DC | PRN

## 2024-01-31 NOTE — Inpatient Diabetes Management (Signed)
 Inpatient Diabetes Program Recommendations  AACE/ADA: New Consensus Statement on Inpatient Glycemic Control (2015)  Target Ranges:  Prepandial:   less than 140 mg/dL      Peak postprandial:   less than 180 mg/dL (1-2 hours)      Critically ill patients:  140 - 180 mg/dL   Lab Results  Component Value Date   GLUCAP 52 (L) 01/31/2024   HGBA1C 6.0 12/02/2023    Latest Reference Range & Units 01/30/24 11:52 01/30/24 16:27 01/30/24 21:24 01/30/24 22:04 01/31/24 07:35  Glucose-Capillary 70 - 99 mg/dL 782 (H) Novolog  5 units 208 (H) Novolog  5 units 63 (L) 103 (H) 52 (L)  (H): Data is abnormally high (L): Data is abnormally low  Diabetes history: DM2 Outpatient Diabetes medications: Lantus  18 units Current orders for Inpatient glycemic control: Novolog  0-15 units tid, 0-5 units hs  Inpatient Diabetes Program Recommendations:   Received Novolog  total 15 units over the past 24 hrs. Correction. Noted hypoglycemia post Novolog  correction. Please consider: -Decrease Novolog  correction to 0-9 units tid, 0-5 units hs -Add Semglee  5 units daily if fasting >180  Thank you, Rahm Minix E. Tomeko Scoville, RN, MSN, CDCES  Diabetes Coordinator Inpatient Glycemic Control Team Team Pager [phone] (8am-5pm) 01/31/2024 8:35 AM

## 2024-01-31 NOTE — Plan of Care (Signed)
?  Problem: Clinical Measurements: ?Goal: Will remain free from infection ?Outcome: Progressing ?  ?Problem: Nutrition: ?Goal: Adequate nutrition will be maintained ?Outcome: Progressing ?  ?Problem: Elimination: ?Goal: Will not experience complications related to bowel motility ?Outcome: Progressing ?  ?Problem: Skin Integrity: ?Goal: Risk for impaired skin integrity will decrease ?Outcome: Progressing ?  ?

## 2024-01-31 NOTE — Assessment & Plan Note (Signed)
 Resolved. Treatment course completed with Cefadroxil .

## 2024-01-31 NOTE — Progress Notes (Signed)
 Mobility Specialist Progress Note:   01/31/24 1151  Mobility  Activity Ambulated with assistance to bathroom  Level of Assistance Minimal assist, patient does 75% or more  Assistive Device Front wheel walker  Distance Ambulated (ft) 15 ft  Activity Response Tolerated fair  Mobility Referral Yes  Mobility visit 1 Mobility  Mobility Specialist Start Time (ACUTE ONLY) 1045  Mobility Specialist Stop Time (ACUTE ONLY) 1110  Mobility Specialist Time Calculation (min) (ACUTE ONLY) 25 min   Pt received with chair alarm going off attempting to ambulate to BR independently. RN present in room and agreeable for MS to take over. Large BM present but pt having trouble pushing out BM in commode. Unclear if its d/t cognition or large stool. After several attempts BM successful. MS and NT assisted with pericare. Pt c/o weakness upon standing and stating that she can't walk d/t weakness. Chair rolled to BR and after encouragement pt was able to ambulate to chair with MinA. Pt left in chair with call bell in reach and all needs met. Chair alarm on.   Brown Husband  Mobility Specialist Please contact via SecureChat Rehab office at [phone]

## 2024-01-31 NOTE — Progress Notes (Signed)
 Mobility Specialist Progress Note:   01/31/24 1402  Mobility  Activity Transferred from chair to bed  Level of Assistance  (HHA)  Assistive Device None  Distance Ambulated (ft) 2 ft  Activity Response Tolerated well  Mobility Referral Yes  Mobility visit 1 Mobility  Mobility Specialist Start Time (ACUTE ONLY) 1350  Mobility Specialist Stop Time (ACUTE ONLY) 1400  Mobility Specialist Time Calculation (min) (ACUTE ONLY) 10 min   Pt received standing in room with chair alarm going off. Pt attempting to return to bed. CG HHA to pivot to EOB. Pt needing minA VC to encourage pt to take steps towards bed. Pt returned to supine with call bell in reach and reminded not to get up without assistance. Bed alarm on.   Brown Husband  Mobility Specialist Please contact via SecureChat Rehab office at [phone]

## 2024-01-31 NOTE — Assessment & Plan Note (Addendum)
 Intermittent confusion and impulsivity leading to falls. This is likely the cause of her falls at her ALF.  Patient constantly has been trying to get OOB which increases the risk of falling.  Required a 1:1 sitter.  Patient seems to be doing better with a sitter in the room and is redirectable.  Unfortunately her daughter will be unable to provide full-time support since she is recovering from surgery.  There are some concern for hospital delirium vs worsening memory impairment likely 2/2 vascular dementia. - Off 1:1 sitter as of 2/6 8:45 AM - Delirium precautions - Avoid Qtc prolonging medications and reduce meds that can could worsen delirium - Could consider Donepezil for dementia, daughter doesn't seem interested at this time

## 2024-01-31 NOTE — Assessment & Plan Note (Addendum)
 RD consulted, appreciate recommendations. - Ensure full supervision for meals/snacks - PO fluids 250 cc q4h

## 2024-01-31 NOTE — TOC Progression Note (Addendum)
 Transition of Care [facility]) - Progression Note    Patient Details  Name: Judith [name] MRN: [id number] Date of Birth: [birth year]  Transition of Care [facility]) CM/SW Contact  Almarie CHRISTELLA Goodie, [place] Phone Number: 01/31/2024, 11:14 AM  Clinical Narrative:   CSW updated by MD that patient now over 24 hours without a sitter. CSW spoke with [facility], they now will not have a bed until Monday because of an RSV outbreak. CSW coordinated with OT assigned for an updated note, and requested CMA to initiate insurance authorization request. CSW updated MD with barriers to discharge. CSW to follow.   UPDATE: Authorization approved. Auth ID [id number], approved 2/10 with next review date 2/12.    Expected Discharge Plan: Skilled Nursing Facility Barriers to Discharge: English As A Second Language Teacher, Continued Medical Work up  Expected Discharge Plan and Services In-house Referral: Clinical Social Work Discharge Planning Services: CM Consult Post Acute Care Choice: Resumption of Paediatric Nurse, Home Health Living arrangements for the past 2 months: Assisted Living Facility                                       Social Determinants of Health (SDOH) Interventions SDOH Screenings   Food Insecurity: No Food Insecurity (01/25/2024)  Housing: Low Risk  (01/25/2024)  Transportation Needs: No Transportation Needs (01/25/2024)  Utilities: Not At Risk (01/25/2024)  Alcohol Screen: Low Risk  (09/27/2023)  Depression (PHQ2-9): Low Risk  (12/02/2023)  Financial Resource Strain: Low Risk  (10/19/2023)  Physical Activity: Unknown (10/19/2023)  Recent Concern: Physical Activity - Inactive (09/27/2023)  Social Connections: Socially Isolated (01/25/2024)  Stress: Patient Declined (10/19/2023)  Tobacco Use: Low Risk  (01/25/2024)  Health Literacy: Adequate Health Literacy (09/27/2023)    Readmission Risk Interventions     No data to display

## 2024-01-31 NOTE — Assessment & Plan Note (Addendum)
 Wound care as indicated. Keep clean and dry.

## 2024-01-31 NOTE — Assessment & Plan Note (Addendum)
 Extensive debris noted within the paraesophageal soft tissues at the thoracic inlet versus within the esophageal lumen itself on imaging. CXR negative, aspiration risk. SLP consulted and recommended regular thin liquid diet, no follow up needed.

## 2024-01-31 NOTE — Assessment & Plan Note (Addendum)
 Unclear etiology.  Does have a Hx of falls. CT head negative for acute intracranial abnormality and fracture. Mild R infraorbital soft tissue swelling noted. CT neck negative for fracture or listhesis but did demonstrate extensive debris noted in the paraesophageal tissues, possible impacted food bolus vs retained debris.  Echo with EF 50-55%. Unable to perform MR Brain d/t incompatible lead (ventricular lead). Orthostatic VS previously, but improved with 250 cc NS bolus and ted hose upon repeat orthostatics. 1+ pitting edema seen on exam, likely secondary to stasis.  - PT/OT recommended Home Health - Ted hose - Pain: Tylenol  q6h PRN, Voltaren  gel QID PRN - Avoid sedative medications - Fall precautions - SNF likely Monday at [facility] d/t RSV outbreak

## 2024-01-31 NOTE — Progress Notes (Signed)
 Occupational Therapy Treatment Patient Details Name: Judith [name] MRN: [id number] DOB: Aug 14, 1941 Today's Date: 01/31/2024   History of present illness Pt is 83 yo F who presented to ED after a fall at her ALF. Found to have UTI and hypothermic. PMH includes: T2DM, multilevel DDD, peripheral neuropathy, depression, unintentional weight loss, and CAD s/p CABG and pacemaker, complete heart block.   OT comments  Patient stating she wasn't feeling well but agreeable to OOB with OT. Patient requiring increased time and min assist to get to EOB. Patient able to sit on EOB with supervision before transfer to recliner with min assist and cues for hand placement. Patient performed grooming tasks seated in recliner and declined further mobility due to complaints of abdominal pain. Patient will benefit from continued inpatient follow up therapy, <3 hours/day due to requiring more assistance with mobility and self care and unable to return to ILF. Acute OT to continue to follow to address established goals to facilitate DC to next venue of care.       If plan is discharge home, recommend the following:  A lot of help with bathing/dressing/bathroom;A lot of help with walking and/or transfers;Assistance with cooking/housework;Direct supervision/assist for medications management;Direct supervision/assist for financial management;Assist for transportation;Supervision due to cognitive status   Equipment Recommendations  Other (comment) (Defer to next venue of care)    Recommendations for Other Services      Precautions / Restrictions Precautions Precautions: Fall Restrictions Weight Bearing Restrictions Per Provider Order: No       Mobility Bed Mobility Overal bed mobility: Needs Assistance Bed Mobility: Supine to Sit     Supine to sit: Min assist     General bed mobility comments: increased time and cues for rail use    Transfers Overall transfer level: Needs assistance Equipment used:  Rolling walker (2 wheels) Transfers: Sit to/from Stand, Bed to chair/wheelchair/BSC Sit to Stand: Min assist     Step pivot transfers: Min assist     General transfer comment: min assist to stand from EOB and to transfer to recliner     Balance Overall balance assessment: History of Falls, Needs assistance Sitting-balance support: Bilateral upper extremity supported, Feet supported Sitting balance-Leahy Scale: Fair Sitting balance - Comments: supervision seated on EOB   Standing balance support: Bilateral upper extremity supported, Reliant on assistive device for balance Standing balance-Leahy Scale: Poor Standing balance comment: reliant on RW for support                           ADL either performed or assessed with clinical judgement   ADL Overall ADL's : Needs assistance/impaired Eating/Feeding: Set up   Grooming: Wash/dry hands;Wash/dry face;Oral care;Brushing hair;Set up;Sitting Grooming Details (indicate cue type and reason): declined going to bathroom for self care                 Toilet Transfer: Minimal assistance;Rolling walker (2 wheels) Toilet Transfer Details (indicate cue type and reason): simulated           General ADL Comments: Cues for encouragement to perform self care tasks    Extremity/Trunk Assessment Upper Extremity Assessment Upper Extremity Assessment: Generalized weakness            Vision       Perception     Praxis      Cognition Arousal: Alert Behavior During Therapy: Flat affect Overall Cognitive Status: Within Functional Limits for tasks assessed  General Comments: able to give correct place, month, and year. Slow processing        Exercises      Shoulder Instructions       General Comments at end of session BP 135/79(97), HR 74    Pertinent Vitals/ Pain       Pain Assessment Pain Assessment: Faces Faces Pain Scale: Hurts little more Pain  Location: abdomen Pain Descriptors / Indicators: Discomfort Pain Intervention(s): Limited activity within patient's tolerance, Monitored during session, Repositioned, Patient requesting pain meds-RN notified  Home Living                                          Prior Functioning/Environment              Frequency  Min 2X/week        Progress Toward Goals  OT Goals(current goals can now be found in the care plan section)  Progress towards OT goals: Progressing toward goals  Acute Rehab OT Goals OT Goal Formulation: With patient Time For Goal Achievement: 02/08/24 Potential to Achieve Goals: Good  Plan      Co-evaluation                 AM-PAC OT 6 Clicks Daily Activity     Outcome Measure   Help from another person eating meals?: None Help from another person taking care of personal grooming?: A Little Help from another person toileting, which includes using toliet, bedpan, or urinal?: A Lot Help from another person bathing (including washing, rinsing, drying)?: A Lot Help from another person to put on and taking off regular upper body clothing?: A Little Help from another person to put on and taking off regular lower body clothing?: A Lot 6 Click Score: 16    End of Session Equipment Utilized During Treatment: Gait belt;Rolling walker (2 wheels)  OT Visit Diagnosis: Unsteadiness on feet (R26.81);Other abnormalities of gait and mobility (R26.89);History of falling (Z91.81);Muscle weakness (generalized) (M62.81);Other symptoms and signs involving cognitive function;Adult, failure to thrive (R62.7)   Activity Tolerance Patient tolerated treatment well   Patient Left in chair;with call bell/phone within reach;with chair alarm set   Nurse Communication Mobility status;Patient requests pain meds        Time: 9158-9089 OT Time Calculation (min): 29 min  Charges: OT General Charges $OT Visit: 1 Visit OT Treatments $Self Care/Home  Management : 23-37 mins  Dick Laine, OTA Acute Rehabilitation Services  Office [phone]   Jeb LITTIE Laine 01/31/2024, 11:08 AM

## 2024-01-31 NOTE — Progress Notes (Signed)
 Daily Progress Note Intern Pager: [phone]  Patient name: Judith [name] Medical record number: [id number] Date of birth: [birth year] Age: 83 y.o. Gender: female  Primary Care Provider: Dartha Geralds, DO Consultants: None Code Status: Full Code   Pt Overview and Major Events to Date:  2/1: Admitted  Assessment and Plan: Judith [name] is a 83 y.o. female with past medical history of T2DM, degenerative disease, peripheral neuropathy, depression, and unintentional weight loss presenting with fall, found to be hypothermic in the setting of likely UTI.    Initially patient was going to go back to her ALF, however per family (who report that she isn't at her baseline - normally independent w/ ADLs at baseline) and PT/OT SNF has been recommended.  Has been off sitter for over 24 hours.  [facility] has accepted the patient however, due to an RSV outbreak, she will have to remain at the hospital till Monday.  Assessment & Plan Fall Unclear etiology.  Does have a Hx of falls. CT head negative for acute intracranial abnormality and fracture. Mild R infraorbital soft tissue swelling noted. CT neck negative for fracture or listhesis but did demonstrate extensive debris noted in the paraesophageal tissues, possible impacted food bolus vs retained debris.  Echo with EF 50-55%. Unable to perform MR Brain d/t incompatible lead (ventricular lead). Orthostatic VS previously, but improved with 250 cc NS bolus and ted hose upon repeat orthostatics. 1+ pitting edema seen on exam, likely secondary to stasis.  - PT/OT recommended Home Health - Ted hose - Pain: Tylenol  q6h PRN, Voltaren  gel QID PRN - Avoid sedative medications - Fall precautions - SNF likely Monday at [facility] d/t RSV outbreak Delirium Intermittent confusion and impulsivity leading to falls. This is likely the cause of her falls at her ALF.  Patient constantly has been trying to get OOB which increases the risk of falling.  Required a 1:1  sitter.  Patient seems to be doing better with a sitter in the room and is redirectable.  Unfortunately her daughter will be unable to provide full-time support since she is recovering from surgery.  There are some concern for hospital delirium vs worsening memory impairment likely 2/2 vascular dementia. - Off 1:1 sitter as of 2/6 8:45 AM - Delirium precautions - Avoid Qtc prolonging medications and reduce meds that can could worsen delirium - Could consider Donepezil for dementia, daughter doesn't seem interested at this time Depression Depression is likely significantly impacting her care and could be contributing to her decline.  - Sertraline  25 mg daily Esophageal abnormality Extensive debris noted within the paraesophageal soft tissues at the thoracic inlet versus within the esophageal lumen itself on imaging. CXR negative, aspiration risk. SLP consulted and recommended regular thin liquid diet, no follow up needed.  Protein-calorie malnutrition, severe RD consulted, appreciate recommendations. - Ensure full supervision for meals/snacks - PO fluids 250 cc q4h Knee abrasion  Wound care as indicated. Keep clean and dry.  Acute cystitis with hematuria Resolved. Treatment course completed with Cefadroxil .   Chronic and Stable Problems: T2DM: Sugars 50-150s.  mSSI discontinued along with CBG monitoring. HTN: On Coreg  12.5 mg BID at home. Will monitor BP and restart as indicated. HLD: Continue Crestor  10 mg daily DDD: Chronic issue. No sign of fracture or listhesis in CT neck. Manage any pain with Tylenol  and Voltaren  gel at this time. Will hold Oxycodone  at discharge.  Peripheral neuropathy: On Gabapentin  100 mg in AM, PM, and 400 mg at bedtime, restart when  able to verify medications  Depression: On Cymbalta  60 mg daily  Complete Heart Block with pacemaker: PPM interrogation which demonstrated an unsafe lead for MRI  Pulmonary nodules: Incidental 3 mm left upper pulmonary nodule found  05/2023 will need repeat low dose CT in 6-12 months, will put in follow up instructions in discharge summary  Sleep Apnea: Not on CPAP Gastroparesis: Hx of partial gastrectomy   FEN/GI: Regular Diet PPx: Lovenox  Dispo: [facility]  Subjective:  Patient feels weak and does not want to get out of bed this morning.  She does not have much of an appetite either.  Most of her breakfast was still on her plate.  Mobility was there to help her get up and move to her chair.  She complains of some bilateral heel pain, which is chronic.  Objective: Temp:  [97.7 F (36.5 C)-98.4 F (36.9 C)] 97.8 F (36.6 C) (02/07 0738) Pulse Rate:  [72-105] 80 (02/07 0738) Resp:  [15-17] 15 (02/07 0738) BP: (130-155)/(62-90) 146/77 (02/07 0738) SpO2:  [96 %-99 %] 97 % (02/07 0500) Physical Exam: General: Sitting in bed comfortably.  Cardiovascular: RRR. No M/R/G Respiratory: CTAB. Normal WOB on RA.  Abdomen: Soft, non-tender, non-distended. Normoactive bowel sounds.  Extremities: 1+ BLE edema.  Bilateral heels with no wounds or erythema. Neuro: Alert and oriented to self, location, and year. No focal neurological deficits noted.  Laboratory: Most recent CBC Lab Results  Component Value Date   WBC 9.5 01/30/2024   HGB 12.8 01/30/2024   HCT 38.4 01/30/2024   MCV 99.2 01/30/2024   PLT 340 01/30/2024   Most recent BMP    Latest Ref Rng & Units 01/30/2024    7:07 AM  BMP  Glucose 70 - 99 mg/dL 860   BUN 8 - 23 mg/dL 12   Creatinine 9.55 - 1.00 mg/dL 9.18   Sodium 864 - 854 mmol/L 136   Potassium 3.5 - 5.1 mmol/L 3.9   Chloride 98 - 111 mmol/L 101   CO2 22 - 32 mmol/L 23   Calcium  8.9 - 10.3 mg/dL 8.7    CBG: 52  Imaging/Diagnostic Tests: No new imaging.  Janna Ferrier, DO 01/31/2024, 1:11 PM  PGY-1, [facility] FPTS Intern pager: [phone], text pages welcome Secure chat group [facility] Teaching Service

## 2024-01-31 NOTE — Assessment & Plan Note (Addendum)
 Depression is likely significantly impacting her care and could be contributing to her decline.  - Sertraline  25 mg daily

## 2024-02-01 DIAGNOSIS — F329 Major depressive disorder, single episode, unspecified: Secondary | ICD-10-CM | POA: Diagnosis not present

## 2024-02-01 DIAGNOSIS — R531 Weakness: Secondary | ICD-10-CM | POA: Diagnosis not present

## 2024-02-01 DIAGNOSIS — R296 Repeated falls: Secondary | ICD-10-CM | POA: Diagnosis not present

## 2024-02-01 LAB — GLUCOSE, CAPILLARY
Glucose-Capillary: 192 mg/dL — ABNORMAL HIGH (ref 70–99)
Glucose-Capillary: 245 mg/dL — ABNORMAL HIGH (ref 70–99)
Glucose-Capillary: 265 mg/dL — ABNORMAL HIGH (ref 70–99)

## 2024-02-01 LAB — HEMOGLOBIN A1C
Hgb A1c MFr Bld: 6.2 % — ABNORMAL HIGH (ref 4.8–5.6)
Mean Plasma Glucose: 131.24 mg/dL

## 2024-02-01 MED ORDER — INSULIN ASPART 100 UNIT/ML IJ SOLN
0.0000 [IU] | Freq: Three times a day (TID) | INTRAMUSCULAR | Status: DC
  Administered 2024-02-01: 2 [IU] via SUBCUTANEOUS
  Administered 2024-02-01: 3 [IU] via SUBCUTANEOUS

## 2024-02-01 MED ORDER — GABAPENTIN 300 MG PO CAPS
300.0000 mg | ORAL_CAPSULE | Freq: Every day | ORAL | Status: DC
  Administered 2024-02-01 – 2024-02-02 (×2): 300 mg via ORAL
  Filled 2024-02-01 (×2): qty 1

## 2024-02-01 MED ORDER — INSULIN ASPART 100 UNIT/ML IJ SOLN: 0.0000 [IU] | Freq: Every day | INTRAMUSCULAR | Status: DC

## 2024-02-01 NOTE — Assessment & Plan Note (Signed)
 PT/OT following. - PT/OT recommended Home Health - Ted hose - Pain: Tylenol  q6h PRN, Voltaren  gel QID PRN - Avoid sedative medications - Fall precautions - SNF likely Monday at [facility] d/t RSV outbreak

## 2024-02-01 NOTE — Assessment & Plan Note (Signed)
 RD consulted, appreciate recommendations. - Ensure full supervision for meals/snacks - PO fluids 250 cc q4h

## 2024-02-01 NOTE — Progress Notes (Addendum)
     Daily Progress Note Intern Pager: [phone]  Patient name: Judith [name] Medical record number: [id number] Date of birth: 1941/07/26 Age: 83 y.o. Gender: female  Primary Care Provider: Dartha Geralds, DO Consultants: None Code Status: Full code   Pt Overview and Major Events to Date:  2/1: admitted   Assessment and Plan: Patietn is a 83 yo F with Pmhx of DMII, peripheral neuropathy and depression admitted for fall and hypothermia in setting of likely UTI. Patient is stable for discharge. Patient likely to discharge to [facility] Monday.   Assessment & Plan Fall PT/OT following. - PT/OT recommended Home Health - Ted hose - Pain: Tylenol  q6h PRN, Voltaren  gel QID PRN - Avoid sedative medications - Fall precautions - SN[facility] likely Monday at [facility] d/t RSV outbreak Delirium Patient seems to have improved. Did not require sitter since 2/6.  - Delirium precautions  Protein-calorie malnutrition, severe RD consulted, appreciate recommendations. - Ensure full supervision for meals/snacks - PO fluids 250 cc q4h Chronic and stable: T2DM: Sugars 50-150s. Added on sSSI due to high CBG overnight HTN: On Coreg  12.5 mg BID at home. Will monitor BP and restart as indicated. HLD: Continue Crestor  10 mg daily DDD: Chronic issue. No sign of fracture or listhesis in CT neck. Manage any pain with Tylenol  and Voltaren  gel at this time. Will hold Oxycodone  at discharge.  Peripheral neuropathy: Added 300mg  gabapentin  at night per last PCP note  Depression: Previously on Cymbalta  60 mg daily - switched to sertraline  25 mg daily due to increased risk of falls   FEN/GI: Regular  PPx: Lovenox  Dispo: SNF pending clearance from SNF  Subjective:  Patient reports feeling okay. Does have some pain in her legs and feet.   Objective: Temp:  [97.8 F (36.6 C)-98.5 F (36.9 C)] 98.5 F (36.9 C) (02/07 2011) Pulse Rate:  [72-83] 79 (02/07 2011) Resp:  [14-16] 14 (02/07 2011) BP: (125-155)/(63-90)  133/67 (02/07 2011) SpO2:  [97 %-100 %] 100 % (02/07 2011) Physical Exam: General: sleeping comfortably in bed, NAD  Cardiovascular: RRR, no m/r/g Respiratory: CTAB, NWOB on RA Abdomen: soft, NTND Extremities: no LE edema noted   Laboratory: Most recent CBC Lab Results  Component Value Date   WBC 9.5 01/30/2024   HGB 12.8 01/30/2024   HCT 38.4 01/30/2024   MCV 99.2 01/30/2024   PLT 340 01/30/2024   Most recent BMP    Latest Ref Rng & Units 01/30/2024    7:07 AM  BMP  Glucose 70 - 99 mg/dL 860   BUN 8 - 23 mg/dL 12   Creatinine 9.55 - 1.00 mg/dL 9.18   Sodium 864 - 854 mmol/L 136   Potassium 3.5 - 5.1 mmol/L 3.9   Chloride 98 - 111 mmol/L 101   CO2 22 - 32 mmol/L 23   Calcium  8.9 - 10.3 mg/dL 8.7     Imaging/Diagnostic Tests: No new imaging  Lonnie Earnest, MD 02/01/2024, 1:58 AM  PGY-1, [facility] FPTS Intern pager: [phone], text pages welcome Secure chat group [facility] Teaching Service

## 2024-02-01 NOTE — Progress Notes (Addendum)
 Mobility Specialist Progress Note:   02/01/24 1400  Mobility  Activity  (Bed Mobility)  Level of Assistance Standby assist, set-up cues, supervision of patient - no hands on  Range of Motion/Exercises Active;Left arm;Right leg  Activity Response Tolerated fair  Mobility visit 1 Mobility  Mobility Specialist Start Time (ACUTE ONLY) 1350  Mobility Specialist Stop Time (ACUTE ONLY) 1400  Mobility Specialist Time Calculation (min) (ACUTE ONLY) 10 min    Pt received in bed, agreeable to mobility session. When given cues for sitting EOB as well as offering assistance, pt states they cannot move their legs or feet. Attempted bed mobility but pt would perform minimal LE exercises before becoming tearful. Pt able to bend knees and slide legs partially to the EOB. Pt reports both her legs and feet hurt on the bottom.  Deferred further mobility at this time. Left pt with all needs met, call bell in reach, bed alarm on.   Kora Groom Mobility Specialist Please contact via Special Educational Needs Teacher or  Rehab office at [phone]

## 2024-02-01 NOTE — Assessment & Plan Note (Signed)
 Patient seems to have improved. Did not require sitter since 2/6.  - Delirium precautions

## 2024-02-02 DIAGNOSIS — F329 Major depressive disorder, single episode, unspecified: Secondary | ICD-10-CM | POA: Diagnosis not present

## 2024-02-02 DIAGNOSIS — N3001 Acute cystitis with hematuria: Secondary | ICD-10-CM | POA: Diagnosis not present

## 2024-02-02 DIAGNOSIS — R531 Weakness: Secondary | ICD-10-CM

## 2024-02-02 DIAGNOSIS — K229 Disease of esophagus, unspecified: Secondary | ICD-10-CM | POA: Diagnosis not present

## 2024-02-02 DIAGNOSIS — E43 Unspecified severe protein-calorie malnutrition: Secondary | ICD-10-CM | POA: Diagnosis not present

## 2024-02-02 LAB — GLUCOSE, CAPILLARY: Glucose-Capillary: 285 mg/dL — ABNORMAL HIGH (ref 70–99)

## 2024-02-02 MED ORDER — CARVEDILOL 6.25 MG PO TABS
6.2500 mg | ORAL_TABLET | Freq: Two times a day (BID) | ORAL | Status: DC
  Administered 2024-02-02 – 2024-02-03 (×3): 6.25 mg via ORAL
  Filled 2024-02-02 (×4): qty 1

## 2024-02-02 NOTE — Assessment & Plan Note (Addendum)
 Stable to continue therapies at her outpatient facility. - Pain: Tylenol  q6h PRN, Lidocaine  Patch, Voltaren  gel QID PRN, Oxycodone  at half dose (2.5mg ) q8h PRN  - Avoid sedating medications - Fall precautions - SNF likely Monday 2/10 at [facility] d/t RSV outbreak

## 2024-02-02 NOTE — Assessment & Plan Note (Signed)
 RD consulted, appreciate recommendations. - Ensure full supervision/assistance with meals/snacks - PO fluids 250 cc q4h

## 2024-02-02 NOTE — Plan of Care (Signed)

## 2024-02-02 NOTE — Progress Notes (Signed)
     Daily Progress Note Intern Pager: [phone]  Patient name: Judith [name] Medical record number: [id number] Date of birth: [birth year] Age: 83 y.o. Gender: female  Primary Care Provider: Dartha Geralds, DO Consultants: None Code Status: Full Code  Pt Overview and Major Events to Date:  2/1- admitted  Assessment and Plan: Ms. Kinnaird is an 83 yo female admitted for fall and UTI. She is now stable for discharge awaiting discharge to [facility], likely on 2/10. Pertinent PMH/PSH includes DM2, Peripheral Neuropathy, Depression.  Assessment & Plan Fall Stable to continue therapies at her outpatient facility. - Pain: Tylenol  q6h PRN, Lidocaine  Patch, Voltaren  gel QID PRN, Oxycodone  at half dose (2.5mg ) q8h PRN  - Avoid sedating medications - Fall precautions - SNF likely Monday 2/10 at [facility] d/t RSV outbreak Delirium Patient seems to have improved. Is alert and oriented to person, place and situation today.   - Delirium precautions Protein-calorie malnutrition, severe RD consulted, appreciate recommendations. - Ensure full supervision/assistance with meals/snacks - PO fluids 250 cc q4h Acute cystitis with hematuria Resolved. Treatment course completed with Cefadroxil .   Chronic and Stable Conditions T2DM - CBGs 190s-260s yesterday.  HTN - Restarting her home Coreg  at half dose (6.25mg  BID) HLD - Crestor  10mg  daily DDD - Tylenol  and Voltaren , Oxycodone  has been dose reduced to 2.5mg  q8h PRN  Peripheral neuropathy - gabapentin  300mg  at bedtime Depression - Sertraline  25mg  daily    FEN/GI: Regular PPx: Lovenox  Dispo:SNF tomorrow. Barriers include placement at her facility. She is medically stable for discharge..   Subjective:  Ms. Kintz tells me that she feels well this morning. She denies any new complaints this morning. Eager to get out of here.   Objective: Temp:  [97.5 F (36.4 C)-98.2 F (36.8 C)] 97.5 F (36.4 C) (02/09 0511) Pulse Rate:  [67-84] 79  (02/09 0511) Resp:  [16-18] 18 (02/09 0511) BP: (133-171)/(60-79) 171/79 (02/09 0511) SpO2:  [84 %-99 %] 95 % (02/08 2109) Physical Exam: General: Elderly, sleeping on arrival, awakens to tactile and verbal stimuli Cardiovascular: RRR, no murmur Respiratory: Normal WOB on RA, lungs clear Abdomen: Non-tender, non-distended Extremities: Without edema or deformity   Laboratory: Most recent CBC Lab Results  Component Value Date   WBC 9.5 01/30/2024   HGB 12.8 01/30/2024   HCT 38.4 01/30/2024   MCV 99.2 01/30/2024   PLT 340 01/30/2024   Most recent BMP    Latest Ref Rng & Units 01/30/2024    7:07 AM  BMP  Glucose 70 - 99 mg/dL 860   BUN 8 - 23 mg/dL 12   Creatinine 9.55 - 1.00 mg/dL 9.18   Sodium 864 - 854 mmol/L 136   Potassium 3.5 - 5.1 mmol/L 3.9   Chloride 98 - 111 mmol/L 101   CO2 22 - 32 mmol/L 23   Calcium  8.9 - 10.3 mg/dL 8.7     Imaging/Diagnostic Tests: No new imaging, tests.   Marlee Lynwood NOVAK, MD 02/02/2024, 5:57 AM  PGY-3, [facility] Family [facility] FPTS Intern pager: [phone], text pages welcome Secure chat group [facility] Teaching Service

## 2024-02-02 NOTE — Assessment & Plan Note (Addendum)
 Resolved. Treatment course completed with Cefadroxil .

## 2024-02-02 NOTE — Assessment & Plan Note (Addendum)
 Patient seems to have improved. Is alert and oriented to person, place and situation today.   - Delirium precautions

## 2024-02-03 ENCOUNTER — Other Ambulatory Visit (HOSPITAL_COMMUNITY): Payer: Self-pay

## 2024-02-03 DIAGNOSIS — E1140 Type 2 diabetes mellitus with diabetic neuropathy, unspecified: Secondary | ICD-10-CM | POA: Diagnosis not present

## 2024-02-03 DIAGNOSIS — F329 Major depressive disorder, single episode, unspecified: Secondary | ICD-10-CM | POA: Diagnosis not present

## 2024-02-03 DIAGNOSIS — Z794 Long term (current) use of insulin: Secondary | ICD-10-CM | POA: Diagnosis not present

## 2024-02-03 DIAGNOSIS — N3000 Acute cystitis without hematuria: Secondary | ICD-10-CM | POA: Diagnosis not present

## 2024-02-03 LAB — GLUCOSE, CAPILLARY: Glucose-Capillary: 388 mg/dL — ABNORMAL HIGH (ref 70–99)

## 2024-02-03 MED ORDER — SERTRALINE HCL 25 MG PO TABS: 25.0000 mg | ORAL_TABLET | Freq: Every day | ORAL | Status: DC

## 2024-02-03 MED ORDER — CARVEDILOL 6.25 MG PO TABS: 6.2500 mg | ORAL_TABLET | Freq: Two times a day (BID) | ORAL | Status: DC

## 2024-02-03 NOTE — Inpatient Diabetes Management (Signed)
 Inpatient Diabetes Program Recommendations  AACE/ADA: New Consensus Statement on Inpatient Glycemic Control (2015)  Target Ranges:  Prepandial:   less than 140 mg/dL      Peak postprandial:   less than 180 mg/dL (1-2 hours)      Critically ill patients:  140 - 180 mg/dL   Lab Results  Component Value Date   GLUCAP 388 (H) 02/03/2024   HGBA1C 6.2 (H) 02/01/2024    Review of Glycemic Control  Latest Reference Range & Units 02/01/24 08:18 02/01/24 11:58 02/01/24 16:30 02/02/24 07:52 02/03/24 08:27  Glucose-Capillary 70 - 99 mg/dL 562 (H) 130 (H) 865 (H) 285 (H) 388 (H)   Diabetes history: DM 2 Outpatient Diabetes medications: none Current orders for Inpatient glycemic control: none  Note: pt on Ensure Enlive bid between meals (40 grams of carbohydrate)  Inpatient Diabetes Program Recommendations:    -   Add Novolog  "very sensitive scale" 0-6 units tid + hs that starts at 151 mg/dl  Thanks,  Eloise Hake RN, MSN, BC-ADM Inpatient Diabetes Coordinator Team Pager [phone] (8a-5p)

## 2024-02-03 NOTE — Progress Notes (Signed)
 Physical Therapy Treatment Patient Details Name: Judith [name] MRN: [id number] DOB: [birth year] Today's Date: 02/03/2024   History of Present Illness Pt is 83 yo F who presented to ED after a fall at her ALF. Found to have UTI and hypothermic. PMH includes: T2DM, multilevel DDD, peripheral neuropathy, depression, unintentional weight loss, and CAD s/p CABG and pacemaker, complete heart block.    PT Comments  Patient agreeable to participate with therapy today. Prior to her PT consult, patient was seen seated in her recliner, eating breakfast. The patient required Mod A +1 to initiate sit > stand transfer when standing up from her recliner and toilet. The patient completed ADLs, such as toileting and washing hands following transfer from recliner. Patient responds well to consistent tactile and verbal cueing to initiate functional mobility. Following ADLs, patient ambulated 18ft within the unit using a RW, requiring Min A +1 to initiate stepping and steering of RW. Patient will benefit from continued inpatient follow up therapy, <3 hours/day.    If plan is discharge home, recommend the following: A little help with bathing/dressing/bathroom;Assistance with cooking/housework;Assist for transportation;Help with stairs or ramp for entrance;Direct supervision/assist for medications management;A lot of help with walking and/or transfers   Can travel by private vehicle     Yes  Equipment Recommendations  None recommended by PT    Recommendations for Other Services       Precautions / Restrictions Precautions Precautions: Fall Restrictions Weight Bearing Restrictions Per Provider Order: No     Mobility  Bed Mobility                    Transfers Overall transfer level: Needs assistance Equipment used: Rolling walker (2 wheels) Transfers: Sit to/from Stand Sit to Stand: Mod assist           General transfer comment: Mod A to stand from chair. Patient able to push off using UE  support and bilateral LE support, requires additional assitance to initiate ascension from seat    Ambulation/Gait Ambulation/Gait assistance: Min assist Gait Distance (Feet): 55 Feet Assistive device: Rolling walker (2 wheels) Gait Pattern/deviations: Step-through pattern, Decreased stride length, Narrow base of support Gait velocity: decreased     General Gait Details: Pt requiring consistent tactile/verbal cueing for stepping initiation, anterior/forward momentum, and steering of RW.   Stairs             Wheelchair Mobility     Tilt Bed    Modified Rankin (Stroke Patients Only)       Balance Overall balance assessment: History of Falls, Needs assistance Sitting-balance support: Bilateral upper extremity supported, Feet supported Sitting balance-Leahy Scale: Fair     Standing balance support: Bilateral upper extremity supported, Reliant on assistive device for balance Standing balance-Leahy Scale: Poor Standing balance comment: reliant on RW for support                            Cognition Arousal: Alert Behavior During Therapy: Flat affect Overall Cognitive Status: Within Functional Limits for tasks assessed                                 General Comments: Slow processing, often reports that she cannot proceed with functional activity. However, frequent tactile facilitation benefits the patient to perform mobility tasks        Exercises      General Comments  Pertinent Vitals/Pain Pain Assessment Pain Assessment: Faces Faces Pain Scale: Hurts little more Pain Location: abdomen Pain Descriptors / Indicators: Discomfort Pain Intervention(s): Limited activity within patient's tolerance, Monitored during session    Home Living                          Prior Function            PT Goals (current goals can now be found in the care plan section) Acute Rehab PT Goals PT Goal Formulation: With  patient Time For Goal Achievement: 02/08/24 Potential to Achieve Goals: Fair Progress towards PT goals: Progressing toward goals    Frequency    Min 1X/week      PT Plan      Co-evaluation              AM-PAC PT "6 Clicks" Mobility   Outcome Measure  Help needed turning from your back to your side while in a flat bed without using bedrails?: A Little Help needed moving from lying on your back to sitting on the side of a flat bed without using bedrails?: A Little Help needed moving to and from a bed to a chair (including a wheelchair)?: A Little Help needed standing up from a chair using your arms (e.g., wheelchair or bedside chair)?: A Lot Help needed to walk in hospital room?: A Little Help needed climbing 3-5 steps with a railing? : Total 6 Click Score: 15    End of Session Equipment Utilized During Treatment: Gait belt Activity Tolerance: Patient tolerated treatment well Patient left: in chair;with call bell/phone within reach;with chair alarm set   PT Visit Diagnosis: Unsteadiness on feet (R26.81);History of falling (Z91.81)     Time: 1478-2956 PT Time Calculation (min) (ACUTE ONLY): 31 min  Charges:    $Therapeutic Activity: 23-37 mins PT General Charges $$ ACUTE PT VISIT: 1 Visit                     Judith [name], SPT    Taressa Rauh 02/03/2024, 10:04 AM

## 2024-02-03 NOTE — TOC Transition Note (Signed)
 Transition of Care [facility]) - Discharge Note   Patient Details  Name: Judith [name] MRN: [id number] Date of Birth: 08-Mar-1941  Transition of [facility]) CM/SW Contact:  Sherial Dimes, LCSW Phone Number: 02/03/2024, 11:51 AM   Clinical Narrative:     Pt to be transported to [facility] via [facility]. Nurse to call report to [phone].  Final next level of care: Skilled Nursing Facility Barriers to Discharge: Barriers Resolved   Patient Goals and CMS Choice     Choice offered to / list presented to : Patient      Discharge Placement              Patient chooses bed at: [facility] Patient to be transferred to facility by: [facility] Name of family member notified: [name] Patient and family notified of of transfer: 02/03/24  Discharge Plan and Services Additional resources added to the After Visit Summary for   In-house Referral: Clinical Social Work Discharge Planning Services: CM Consult Post Acute Care Choice: Resumption of Svcs/PTA Provider, Home Health                               Social Drivers of Health (SDOH) Interventions SDOH Screenings   Food Insecurity: No Food Insecurity (01/25/2024)  Housing: Low Risk  (01/25/2024)  Transportation Needs: No Transportation Needs (01/25/2024)  Utilities: Not At Risk (01/25/2024)  Alcohol Screen: Low Risk  (09/27/2023)  Depression (PHQ2-9): Low Risk  (12/02/2023)  Financial Resource Strain: Low Risk  (10/19/2023)  Physical Activity: Unknown (10/19/2023)  Recent Concern: Physical Activity - Inactive (09/27/2023)  Social Connections: Socially Isolated (01/25/2024)  Stress: Patient Declined (10/19/2023)  Tobacco Use: Low Risk  (01/25/2024)  Health Literacy: Adequate Health Literacy (09/27/2023)     Readmission Risk Interventions     No data to display

## 2024-02-03 NOTE — Care Management Important Message (Signed)
 Important Message  Patient Details  Name: Judith [name] MRN: [id number] Date of Birth: Oct 13, 1941   Important Message Given:  Yes - Medicare IM  Patient left prior to IM delivery will mail a copy to the patient home address.   Ameshia Pewitt 02/03/2024, 4:05 PM

## 2024-02-03 NOTE — Assessment & Plan Note (Addendum)
 Depression is likely significantly impacting her care and could be contributing to her decline.  - Sertraline  25 mg daily

## 2024-02-03 NOTE — Assessment & Plan Note (Addendum)
 Resolved. Treatment course completed with Cefadroxil .

## 2024-02-03 NOTE — Progress Notes (Signed)
 Daily Progress Note Intern Pager: [phone]  Patient name: Judith [name] Medical record number: [id number] Date of birth: January 23, 1941 Age: 83 y.o. Gender: female  Primary Care Provider: Dameron, Marisa, DO Consultants: None Code Status: Full Code   Pt Overview and Major Events to Date:  2/1: Admitted  Assessment and Plan: Judith [name] is a 83 yo F with PMHx of T2DM, peripheral neuropathy, and multipel falls presenting with fall and UTI. S/p treatment for UTI and is stable for discharge to [facility] today.  Assessment & Plan Fall Stable to continue therapies at her outpatient facility. - Pain: Tylenol  q6h PRN, Lidocaine  Patch, Voltaren  gel QID PRN, Oxycodone  at half dose (2.5mg ) q8h PRN  - Avoid sedating medications - Fall precautions - SNF today Monday 2/10 at [facility] Delirium Patient seems to have improved. Is alert and oriented to person, place and situation today.   - Delirium precautions Protein-calorie malnutrition, severe RD consulted, appreciate recommendations. - Ensure full supervision/assistance with meals/snacks - PO fluids 250 cc q4h Acute cystitis with hematuria Resolved. Treatment course completed with Cefadroxil .  Depression Depression is likely significantly impacting her care and could be contributing to her decline.  - Sertraline  25 mg daily Esophageal abnormality Extensive debris noted within the paraesophageal soft tissues at the thoracic inlet versus within the esophageal lumen itself on imaging. CXR negative, aspiration risk. SLP consulted and recommended regular thin liquid diet, no follow up needed.  Knee abrasion  Wound care as indicated. Keep clean and dry.  Generalized weakness Likely due to age and decline.  Will continue to monitor at SNF.   Chronic and Stable Problems: T2DM: A1c 6.0. Elevated AM sugars, titrated SSI as indicated. HTN: Restarted Coreg  6.25 mg BID HLD: Continued Crestor  10 mg daily DDD: Chronic issue. No sign of  fracture or listhesis in CT neck. Manage any pain with Tylenol  and Voltaren  gel at this time. Oxycodone  2.5 mg prn q8h. Peripheral neuropathy: On Gabapentin  400 mg at bedtime at home. Continued Gabapentin  300 mg nightly inpatient.  Pulmonary nodules: Incidental 3 mm left upper pulmonary nodule found 05/2023 will need repeat low dose CT in 6-12 months  FEN/GI: Regular Diet PPx: Lovenox  Dispo:SNF today.   Subjective:  Patient complains of feeling bad this morning as she does daily. She has some improvement in her leg pain.   Objective: Temp:  [97.3 F (36.3 C)-98.8 F (37.1 C)] 98.8 F (37.1 C) (02/10 0735) Pulse Rate:  [69-87] 87 (02/10 0735) Resp:  [16-18] 16 (02/10 0735) BP: (129-155)/(65-93) 155/81 (02/10 0735) SpO2:  [96 %-100 %] 96 % (02/10 0735) Physical Exam: General: Resting comfortably in chair in NAD. Cardiovascular: RRR. No M/R/G. Respiratory: CTAB. Normal WOB on RA.  Abdomen: Soft, nontender, nondistended. Normoactive bowel sounds.  Extremities: No BLE edema. B/l hand swelling noted.   Laboratory: Most recent CBC Lab Results  Component Value Date   WBC 9.5 01/30/2024   HGB 12.8 01/30/2024   HCT 38.4 01/30/2024   MCV 99.2 01/30/2024   PLT 340 01/30/2024   Most recent BMP    Latest Ref Rng & Units 01/30/2024    7:07 AM  BMP  Glucose 70 - 99 mg/dL 147   BUN 8 - 23 mg/dL 12   Creatinine 8.29 - 1.00 mg/dL 5.62   Sodium 130 - 865 mmol/L 136   Potassium 3.5 - 5.1 mmol/L 3.9   Chloride 98 - 111 mmol/L 101   CO2 22 - 32 mmol/L 23   Calcium  8.9 -  10.3 mg/dL 8.7    Imaging/Diagnostic Tests: No new imaging.   Clyda Dark, DO 02/03/2024, 12:57 PM  PGY-1, [facility] FPTS Intern pager: [phone], text pages welcome Secure chat group [facility] Teaching Service

## 2024-02-03 NOTE — Assessment & Plan Note (Addendum)
 Likely due to age and decline.  Will continue to monitor at SNF.

## 2024-02-03 NOTE — Assessment & Plan Note (Addendum)
 Extensive debris noted within the paraesophageal soft tissues at the thoracic inlet versus within the esophageal lumen itself on imaging. CXR negative, aspiration risk. SLP consulted and recommended regular thin liquid diet, no follow up needed.

## 2024-02-03 NOTE — Assessment & Plan Note (Addendum)
 Wound care as indicated. Keep clean and dry.

## 2024-02-03 NOTE — Assessment & Plan Note (Addendum)
 Patient seems to have improved. Is alert and oriented to person, place and situation today.   - Delirium precautions

## 2024-02-03 NOTE — Progress Notes (Signed)
 Mobility Specialist Progress Note:   02/03/24 1308  Mobility  Activity Stood at bedside  Level of Assistance Contact guard assist, steadying assist  Assistive Device Front wheel walker  Activity Response Tolerated well  Mobility Referral Yes  Mobility visit 1 Mobility  Mobility Specialist Start Time (ACUTE ONLY) 1300  Mobility Specialist Stop Time (ACUTE ONLY) 1308  Mobility Specialist Time Calculation (min) (ACUTE ONLY) 8 min   Pt received in chair, needing repositioning. Pt more alert this session able to respond quickly to commands. C/o lower back pain, otherwise asx throughout. Pt left in chair with call bell in reach and all needs met. Chair alarm on.   Sofia Dunn  Mobility Specialist Please contact via SecureChat Rehab office at [phone]

## 2024-02-03 NOTE — Assessment & Plan Note (Signed)
 Stable to continue therapies at her outpatient facility. - Pain: Tylenol  q6h PRN, Lidocaine  Patch, Voltaren  gel QID PRN, Oxycodone  at half dose (2.5mg ) q8h PRN  - Avoid sedating medications - Fall precautions - SNF today Monday 2/10 at [facility]

## 2024-02-03 NOTE — Assessment & Plan Note (Addendum)
 RD consulted, appreciate recommendations. - Ensure full supervision/assistance with meals/snacks - PO fluids 250 cc q4h

## 2024-02-04 ENCOUNTER — Ambulatory Visit: Payer: Self-pay | Admitting: Student

## 2024-02-06 ENCOUNTER — Other Ambulatory Visit: Payer: Self-pay

## 2024-02-06 ENCOUNTER — Inpatient Hospital Stay (HOSPITAL_COMMUNITY)
Admission: EM | Admit: 2024-02-06 | Discharge: 2024-02-11 | DRG: 638 | Disposition: A | Discharge status: Skilled Nursing Facility | Payer: 59 | Source: Skilled Nursing Facility | Attending: Family Medicine | Admitting: Family Medicine

## 2024-02-06 ENCOUNTER — Other Ambulatory Visit: Payer: Commercial Managed Care - HMO

## 2024-02-06 ENCOUNTER — Encounter (HOSPITAL_COMMUNITY): Payer: Self-pay

## 2024-02-06 DIAGNOSIS — R7989 Other specified abnormal findings of blood chemistry: Secondary | ICD-10-CM

## 2024-02-06 DIAGNOSIS — Z903 Acquired absence of stomach [part of]: Secondary | ICD-10-CM

## 2024-02-06 DIAGNOSIS — Z833 Family history of diabetes mellitus: Secondary | ICD-10-CM

## 2024-02-06 DIAGNOSIS — G8929 Other chronic pain: Secondary | ICD-10-CM | POA: Diagnosis present

## 2024-02-06 DIAGNOSIS — F32A Depression, unspecified: Secondary | ICD-10-CM | POA: Diagnosis present

## 2024-02-06 DIAGNOSIS — E119 Type 2 diabetes mellitus without complications: Secondary | ICD-10-CM

## 2024-02-06 DIAGNOSIS — Z794 Long term (current) use of insulin: Secondary | ICD-10-CM

## 2024-02-06 DIAGNOSIS — Z950 Presence of cardiac pacemaker: Secondary | ICD-10-CM

## 2024-02-06 DIAGNOSIS — E1110 Type 2 diabetes mellitus with ketoacidosis without coma: Secondary | ICD-10-CM | POA: Diagnosis not present

## 2024-02-06 DIAGNOSIS — I10 Essential (primary) hypertension: Secondary | ICD-10-CM | POA: Diagnosis present

## 2024-02-06 DIAGNOSIS — I2489 Other forms of acute ischemic heart disease: Secondary | ICD-10-CM | POA: Diagnosis present

## 2024-02-06 DIAGNOSIS — E876 Hypokalemia: Secondary | ICD-10-CM | POA: Diagnosis present

## 2024-02-06 DIAGNOSIS — Z9181 History of falling: Secondary | ICD-10-CM

## 2024-02-06 DIAGNOSIS — Z79899 Other long term (current) drug therapy: Secondary | ICD-10-CM

## 2024-02-06 DIAGNOSIS — Z604 Social exclusion and rejection: Secondary | ICD-10-CM | POA: Diagnosis present

## 2024-02-06 DIAGNOSIS — Z886 Allergy status to analgesic agent status: Secondary | ICD-10-CM

## 2024-02-06 DIAGNOSIS — G4730 Sleep apnea, unspecified: Secondary | ICD-10-CM | POA: Diagnosis present

## 2024-02-06 DIAGNOSIS — Z7189 Other specified counseling: Secondary | ICD-10-CM

## 2024-02-06 DIAGNOSIS — R739 Hyperglycemia, unspecified: Secondary | ICD-10-CM | POA: Diagnosis not present

## 2024-02-06 DIAGNOSIS — K219 Gastro-esophageal reflux disease without esophagitis: Secondary | ICD-10-CM | POA: Diagnosis present

## 2024-02-06 DIAGNOSIS — K3184 Gastroparesis: Secondary | ICD-10-CM | POA: Diagnosis present

## 2024-02-06 DIAGNOSIS — I459 Conduction disorder, unspecified: Secondary | ICD-10-CM | POA: Diagnosis present

## 2024-02-06 DIAGNOSIS — Z881 Allergy status to other antibiotic agents status: Secondary | ICD-10-CM

## 2024-02-06 DIAGNOSIS — E1143 Type 2 diabetes mellitus with diabetic autonomic (poly)neuropathy: Secondary | ICD-10-CM | POA: Diagnosis present

## 2024-02-06 DIAGNOSIS — Z87440 Personal history of urinary (tract) infections: Secondary | ICD-10-CM

## 2024-02-06 DIAGNOSIS — Z8711 Personal history of peptic ulcer disease: Secondary | ICD-10-CM

## 2024-02-06 DIAGNOSIS — Z8249 Family history of ischemic heart disease and other diseases of the circulatory system: Secondary | ICD-10-CM

## 2024-02-06 DIAGNOSIS — R54 Age-related physical debility: Secondary | ICD-10-CM | POA: Diagnosis present

## 2024-02-06 DIAGNOSIS — E1165 Type 2 diabetes mellitus with hyperglycemia: Secondary | ICD-10-CM | POA: Diagnosis not present

## 2024-02-06 DIAGNOSIS — I442 Atrioventricular block, complete: Secondary | ICD-10-CM | POA: Diagnosis present

## 2024-02-06 DIAGNOSIS — R41841 Cognitive communication deficit: Secondary | ICD-10-CM | POA: Diagnosis present

## 2024-02-06 DIAGNOSIS — M810 Age-related osteoporosis without current pathological fracture: Secondary | ICD-10-CM | POA: Diagnosis present

## 2024-02-06 DIAGNOSIS — Z882 Allergy status to sulfonamides status: Secondary | ICD-10-CM

## 2024-02-06 DIAGNOSIS — I2510 Atherosclerotic heart disease of native coronary artery without angina pectoris: Secondary | ICD-10-CM | POA: Diagnosis present

## 2024-02-06 DIAGNOSIS — Z66 Do not resuscitate: Secondary | ICD-10-CM | POA: Diagnosis present

## 2024-02-06 DIAGNOSIS — J189 Pneumonia, unspecified organism: Secondary | ICD-10-CM | POA: Diagnosis present

## 2024-02-06 DIAGNOSIS — Z951 Presence of aortocoronary bypass graft: Secondary | ICD-10-CM

## 2024-02-06 DIAGNOSIS — E7800 Pure hypercholesterolemia, unspecified: Secondary | ICD-10-CM | POA: Diagnosis present

## 2024-02-06 DIAGNOSIS — R911 Solitary pulmonary nodule: Secondary | ICD-10-CM | POA: Diagnosis present

## 2024-02-06 DIAGNOSIS — E1142 Type 2 diabetes mellitus with diabetic polyneuropathy: Secondary | ICD-10-CM | POA: Diagnosis present

## 2024-02-06 LAB — BASIC METABOLIC PANEL
Anion gap: 12 (ref 5–15)
BUN: 12 mg/dL (ref 8–23)
CO2: 25 mmol/L (ref 22–32)
Calcium: 9 mg/dL (ref 8.9–10.3)
Chloride: 92 mmol/L — ABNORMAL LOW (ref 98–111)
Creatinine, Ser: 0.93 mg/dL (ref 0.44–1.00)
GFR, Estimated: >60 mL/min (ref >60)
Glucose, Bld: 746 mg/dL (ref 70–99)
Potassium: 4.6 mmol/L (ref 3.5–5.1)
Sodium: 129 mmol/L — ABNORMAL LOW (ref 135–145)

## 2024-02-06 LAB — BETA-HYDROXYBUTYRIC ACID: Beta-Hydroxybutyric Acid: 0.65 mmol/L — ABNORMAL HIGH (ref 0.05–0.27)

## 2024-02-06 LAB — I-STAT CHEM 8, ED
BUN: 12 mg/dL (ref 8–23)
Calcium, Ion: 1.08 mmol/L — ABNORMAL LOW (ref 1.15–1.40)
Chloride: 92 mmol/L — ABNORMAL LOW (ref 98–111)
Creatinine, Ser: 0.8 mg/dL (ref 0.44–1.00)
Glucose, Bld: >700 mg/dL (ref 70–99)
HCT: 37 % (ref 36.0–46.0)
Hemoglobin: 12.6 g/dL (ref 12.0–15.0)
Potassium: 4.6 mmol/L (ref 3.5–5.1)
Sodium: 130 mmol/L — ABNORMAL LOW (ref 135–145)
TCO2: 28 mmol/L (ref 22–32)

## 2024-02-06 LAB — I-STAT VENOUS BLOOD GAS, ED
Acid-Base Excess: 4 mmol/L — ABNORMAL HIGH (ref 0.0–2.0)
Bicarbonate: 29.4 mmol/L — ABNORMAL HIGH (ref 20.0–28.0)
Calcium, Ion: 1.15 mmol/L (ref 1.15–1.40)
HCT: 37 % (ref 36.0–46.0)
Hemoglobin: 12.6 g/dL (ref 12.0–15.0)
O2 Saturation: 47 %
Potassium: 4.6 mmol/L (ref 3.5–5.1)
Sodium: 129 mmol/L — ABNORMAL LOW (ref 135–145)
TCO2: 31 mmol/L (ref 22–32)
pCO2, Ven: 48.7 mm[Hg] (ref 44–60)
pH, Ven: 7.389 (ref 7.25–7.43)
pO2, Ven: 26 mm[Hg] — CL (ref 32–45)

## 2024-02-06 LAB — CBC
HCT: 36.5 % (ref 36.0–46.0)
Hemoglobin: 11.9 g/dL — ABNORMAL LOW (ref 12.0–15.0)
MCH: 33.6 pg (ref 26.0–34.0)
MCHC: 32.6 g/dL (ref 30.0–36.0)
MCV: 103.1 fL — ABNORMAL HIGH (ref 80.0–100.0)
Platelets: 188 10*3/uL (ref 150–400)
RBC: 3.54 MIL/uL — ABNORMAL LOW (ref 3.87–5.11)
RDW: 14.5 % (ref 11.5–15.5)
WBC: 14.6 10*3/uL — ABNORMAL HIGH (ref 4.0–10.5)
nRBC: 0 % (ref 0.0–0.2)

## 2024-02-06 LAB — CBG MONITORING, ED
Glucose-Capillary: >600 mg/dL (ref 70–99)
Glucose-Capillary: >600 mg/dL (ref 70–99)

## 2024-02-06 MED ORDER — SODIUM CHLORIDE 0.9 % IV BOLUS
1000.0000 mL | Freq: Once | INTRAVENOUS | Status: AC
  Administered 2024-02-06: 1000 mL via INTRAVENOUS

## 2024-02-06 MED ORDER — INSULIN REGULAR(HUMAN) IN NACL 100-0.9 UT/100ML-% IV SOLN
INTRAVENOUS | Status: DC
  Administered 2024-02-07: 8 [IU]/h via INTRAVENOUS
  Filled 2024-02-06 (×2): qty 100

## 2024-02-06 MED ORDER — DEXTROSE 50 % IV SOLN
0.0000 mL | INTRAVENOUS | Status: DC | PRN
  Administered 2024-02-07: 50 mL via INTRAVENOUS
  Filled 2024-02-06 (×2): qty 50

## 2024-02-06 MED ORDER — DEXTROSE IN LACTATED RINGERS 5 % IV SOLN: INTRAVENOUS | Status: DC

## 2024-02-06 MED ORDER — POTASSIUM CHLORIDE 10 MEQ/100ML IV SOLN
10.0000 meq | INTRAVENOUS | Status: AC
  Administered 2024-02-07: 10 meq via INTRAVENOUS
  Filled 2024-02-06: qty 100

## 2024-02-06 MED ORDER — LACTATED RINGERS IV BOLUS
1000.0000 mL | Freq: Once | INTRAVENOUS | Status: AC
  Administered 2024-02-07: 1000 mL via INTRAVENOUS

## 2024-02-06 MED ORDER — LACTATED RINGERS IV SOLN: INTRAVENOUS | Status: AC

## 2024-02-06 NOTE — Progress Notes (Incomplete)
Pharmacy Antibiotic Note  Judith [name] is a 83 y.o. female admitted on 02/06/2024 with aspiration pneumonia.  Pharmacy has been consulted for Unasyn dosing.    Plan: {Assessment:21075}  Height: 5\' 3"  (160 cm) Weight: 46.3 kg (102 lb) IBW/kg (Calculated) : 52.4  Temp (24hrs), Avg:98.1 F (36.7 C), Min:98.1 F (36.7 C), Max:98.1 F (36.7 C)  Recent Labs  Lab 02/06/24 2039 02/06/24 2208 02/06/24 2209  WBC 14.6*  --   --   CREATININE  --  0.93 0.80    Estimated Creatinine Clearance: 38.9 mL/min (by C-G formula based on SCr of 0.8 mg/dL).    Allergies  Allergen Reactions  . Asa [Aspirin] Other (See Comments)    Causes ulcers   . Motrin [Ibuprofen] Other (See Comments)    GI intolerance  . Cipro [Ciprofloxacin Hcl] Rash    Ok to use eye drop formulation  . Sulfa Antibiotics Other (See Comments)    GI issues    Antimicrobials this admission: *** *** >> *** *** *** >> ***  Dose adjustments this admission: ***  Microbiology results: *** BCx: *** *** UCx: ***  *** Sputum: ***  *** MRSA PCR: ***  Thank you for allowing pharmacy to be a part of this patient's care.  Fayrene Fearing [facility] 02/06/2024 11:59 PM

## 2024-02-06 NOTE — ED Triage Notes (Signed)
Pt BIB [facility] from [facility] for hypoglycemia. Facility got a "high" reading at 1700 and gave 80 units regular insulin. Rechecked sugar at 1900 and still read "high".  155/48 81HR NS 22g RAC

## 2024-02-06 NOTE — H&P (Signed)
 History and Physical    Patient: Judith [name] VHQ:[id number] DOB: 1941/03/17 DOA: 02/06/2024 DOS: the patient was seen and examined on 02/06/2024 PCP: Darral Dash, DO  Patient coming from: SNF  Chief Complaint:  Chief Complaint  Patient presents with   Hyperglycemia   HPI: Judith [name] is a 83 y.o. female with medical history significant of documented chronic cognitive communication deficit.  Patient further has history of type 2 diabetes mellitus with diabetic neuropathy.  Other medication problems as listed below.  Patient is actually a resident of [facility] from where patient is sent to [facility], ER today.  Review of record indicates that the patient was discharged from [facility] health system on 05/02/2024.  At the time patient was admitted to our hospital system for an unwitnessed fall urinary tract infection.  These were managed conservatively.  Review of medication list indicates that the patient was not discharged on any insulin.  My understanding is that based on patient's diagnosis of diabetes mellitus, she was started on insulin sliding scale today.  During initial blood glucose check, this evening, patient's blood sugar was found to be high.  Patient got subcutaneous insulin and was rechecked 2 hours later without any change in blood sugar.  EMS there was called and the patient was dispatched to [facility], ER.  Unfortunately due to patient's chronic cognitive deficit, history from the patient is very poor/not available.  There is no report of patient having had any vomiting diarrhea polydipsia polyuria.  Glucose here in the ER is noted close to 800.  Patient is ordered for continuous IV insulin infusion.  Medical evaluation is sought.  Patient offers no complaints. Review of Systems: unable to review all systems due to the inability of the patient to answer questions. Past Medical History:  Diagnosis Date   Abdominal pain 06/13/2023   Allergy 1990    Anemia    Arthritis    Biliary gastritis    Blood transfusion without reported diagnosis    2007   Cervical spine arthritis 04/05/2016   Choledocholithiasis    Controlled type 2 diabetes mellitus with diabetic neuropathy, with long-term current use of insulin (HCC)    Depression    Diabetes mellitus type 2 with complications (HCC)    Dilated bile duct    Duodenal ulcer disease    Elevated liver function tests    Encounter for long-term (current) use of insulin (HCC)    Encounter to establish care with new doctor 08/07/2022   Fall 01/11/2023   Gallstones 01/17/2018   Gastric ulcer    Gastritis and gastroduodenitis    Gastroparesis    severe   GERD (gastroesophageal reflux disease)    Gout    tested for gout but was not   Heart murmur    Hiatal hernia    Hyperlipidemia    in past not now   Hypertension    Hypokalemia, gastrointestinal losses    secondary to severe gastroparesis   Internal hemorrhoids    Long-term insulin use in type 2 diabetes (HCC) 04/05/2016   Osteoporosis    just had bone density test possible    Peripheral neuropathy    Postnasal drip 11/01/2022   Presence of cardiac pacemaker    S/P partial gastrectomy    due to severe gastric and gudoenal ulcers   Seasonal allergies    Sleep apnea    could not use CPAP   UTI (urinary tract infection)    Past Surgical History:  Procedure Laterality Date   BILIARY DILATION  02/01/2022   Procedure: BILIARY DILATION;  Surgeon: Mansouraty, Netty Starring., MD;  Location: [facility] ENDOSCOPY;  Service: Gastroenterology;;   BILIARY DILATION  04/09/2022   Procedure: BILIARY DILATION;  Surgeon: Lemar Lofty., MD;  Location: [facility] ENDOSCOPY;  Service: Gastroenterology;;   BILIARY DILATION  06/13/2023   Procedure: BILIARY DILATION;  Surgeon: Lemar Lofty., MD;  Location: [facility] ENDOSCOPY;  Service: Gastroenterology;;   BILIARY STENT PLACEMENT N/A 11/30/2021   Procedure: BILIARY STENT PLACEMENT;  Surgeon:  Lemar Lofty., MD;  Location: [facility] ENDOSCOPY;  Service: Gastroenterology;  Laterality: N/A;   BILIARY STENT PLACEMENT  02/01/2022   Procedure: BILIARY STENT PLACEMENT;  Surgeon: Meridee Score Netty Starring., MD;  Location: [facility] ENDOSCOPY;  Service: Gastroenterology;;   BIOPSY  11/30/2021   Procedure: BIOPSY;  Surgeon: Lemar Lofty., MD;  Location: [facility] ENDOSCOPY;  Service: Gastroenterology;;   BIOPSY  04/09/2022   Procedure: BIOPSY;  Surgeon: Lemar Lofty., MD;  Location: [facility] ENDOSCOPY;  Service: Gastroenterology;;   CHOLECYSTECTOMY N/A 12/06/2021   Procedure: LAPAROSCOPIC CHOLECYSTECTOMY, LYSIS OF ADHESIONS;  Surgeon: Harriette Bouillon, MD;  Location: [facility] ORS;  Service: General;  Laterality: N/A;   COLONOSCOPY  02/11/2018   no polyps, + small internal hemorrhoids   ENDOSCOPIC RETROGRADE CHOLANGIOPANCREATOGRAPHY (ERCP) WITH PROPOFOL N/A 11/30/2021   Procedure: ENDOSCOPIC RETROGRADE CHOLANGIOPANCREATOGRAPHY (ERCP) WITH PROPOFOL;  Surgeon: Lemar Lofty., MD;  Location: [facility] ENDOSCOPY;  Service: Gastroenterology;  Laterality: N/A;   ENDOSCOPIC RETROGRADE CHOLANGIOPANCREATOGRAPHY (ERCP) WITH PROPOFOL N/A 02/01/2022   Procedure: ENDOSCOPIC RETROGRADE CHOLANGIOPANCREATOGRAPHY (ERCP) WITH PROPOFOL;  Surgeon: Meridee Score Netty Starring., MD;  Location: [facility] ENDOSCOPY;  Service: Gastroenterology;  Laterality: N/A;   ENDOSCOPIC RETROGRADE CHOLANGIOPANCREATOGRAPHY (ERCP) WITH PROPOFOL N/A 04/09/2022   Procedure: ENDOSCOPIC RETROGRADE CHOLANGIOPANCREATOGRAPHY (ERCP) WITH PROPOFOL;  Surgeon: Meridee Score Netty Starring., MD;  Location: [facility] ENDOSCOPY;  Service: Gastroenterology;  Laterality: N/A;   ENDOSCOPIC RETROGRADE CHOLANGIOPANCREATOGRAPHY (ERCP) WITH PROPOFOL N/A 06/13/2023   Procedure: ENDOSCOPIC RETROGRADE CHOLANGIOPANCREATOGRAPHY (ERCP) WITH PROPOFOL;  Surgeon: Meridee Score Netty Starring., MD;  Location: [facility] ENDOSCOPY;  Service: Gastroenterology;  Laterality: N/A;   ESOPHAGOGASTRODUODENOSCOPY  (EGD) WITH PROPOFOL N/A 11/30/2021   Procedure: ESOPHAGOGASTRODUODENOSCOPY (EGD) WITH PROPOFOL;  Surgeon: Meridee Score Netty Starring., MD;  Location: [facility] ENDOSCOPY;  Service: Gastroenterology;  Laterality: N/A;   EUS N/A 12/12/2017   Procedure: UPPER ENDOSCOPIC ULTRASOUND (EUS) RADIAL;  Surgeon: Rachael Fee, MD;  Location: [facility] ENDOSCOPY;  Service: Endoscopy;  Laterality: N/A;   EYE SURGERY  2024   GASTRECTOMY     JOINT REPLACEMENT     pace maker     PARTIAL GASTRECTOMY     PPM GENERATOR CHANGEOUT N/A 04/25/2023   Procedure: PPM GENERATOR CHANGEOUT;  Surgeon: Thurmon Fair, MD;  Location: [facility] INVASIVE CV LAB;  Service: Cardiovascular;  Laterality: N/A;   REMOVAL OF STONES  02/01/2022   Procedure: REMOVAL OF STONES;  Surgeon: Meridee Score Netty Starring., MD;  Location: [facility] ENDOSCOPY;  Service: Gastroenterology;;   REMOVAL OF STONES  04/09/2022   Procedure: REMOVAL OF STONES;  Surgeon: Lemar Lofty., MD;  Location: [facility] ENDOSCOPY;  Service: Gastroenterology;;   REMOVAL OF STONES  06/13/2023   Procedure: REMOVAL OF STONES;  Surgeon: Lemar Lofty., MD;  Location: [facility] ENDOSCOPY;  Service: Gastroenterology;;   Dennison Mascot  11/30/2021   Procedure: Dennison Mascot;  Surgeon: Lemar Lofty., MD;  Location: [facility] ENDOSCOPY;  Service: Gastroenterology;;   Francine Graven REMOVAL  02/01/2022   Procedure: STENT REMOVAL;  Surgeon: Lemar Lofty., MD;  Location: [facility] ENDOSCOPY;  Service: Gastroenterology;;  STENT REMOVAL  04/09/2022   Procedure: STENT REMOVAL;  Surgeon: Lemar Lofty., MD;  Location: [facility] ENDOSCOPY;  Service: Gastroenterology;;   SUBMUCOSAL TATTOO INJECTION  11/30/2021   Procedure: SUBMUCOSAL TATTOO INJECTION;  Surgeon: Lemar Lofty., MD;  Location: [facility] ENDOSCOPY;  Service: Gastroenterology;;   TONSILLECTOMY     45 years ago   UPPER GASTROINTESTINAL ENDOSCOPY     Social History:  reports that she has never smoked. She has never used smokeless tobacco.  She reports that she does not drink alcohol and does not use drugs.  Allergies  Allergen Reactions   Asa [Aspirin] Other (See Comments)    Causes ulcers    Motrin [Ibuprofen] Other (See Comments)    GI intolerance   Cipro [Ciprofloxacin Hcl] Rash    Ok to use eye drop formulation   Sulfa Antibiotics Other (See Comments)    GI issues    Family History  Problem Relation Age of Onset   Diabetes Mother    Diabetes Father    Heart disease Father    Diabetes Sister    Heart disease Son    Colon cancer Neg Hx    Colon polyps Neg Hx    Rectal cancer Neg Hx    Stomach cancer Neg Hx    Esophageal cancer Neg Hx     Prior to Admission medications   Medication Sig Start Date End Date Taking? Authorizing Provider  carvedilol (COREG) 6.25 MG tablet Take 1 tablet (6.25 mg total) by mouth 2 (two) times daily with a meal. 02/03/24  Yes Elberta Fortis, MD  famotidine (PEPCID) 20 MG tablet Take 1 tablet (20 mg total) by mouth at bedtime. Patient taking differently: Take 20 mg by mouth every evening. 06/11/19  Yes Lezlie Lye, Meda Coffee, MD  gabapentin (NEURONTIN) 400 MG capsule Take 400 mg by mouth at bedtime.   Yes [provider]  insulin lispro (HUMALOG) 100 UNIT/ML injection Inject 0-12 Units into the skin 4 (four) times daily -  with meals and at bedtime.   Yes [provider]  methenamine (HIPREX) 1 g tablet Take 1 g by mouth 2 (two) times daily. 01/21/24 01/20/25 Yes [provider]  metoCLOPramide (REGLAN) 10 MG tablet Take 1 tablet (10 mg total) by mouth 4 (four) times daily -  before meals and at bedtime. Take 1 tablet 10 mg by mouth 3 times/day before meals and at bedtime prn 09/09/23 07/21/24 Yes Pyrtle, Carie Caddy, MD  mirtazapine (REMERON) 7.5 MG tablet Take 1 tablet (7.5 mg total) by mouth at bedtime. 11/12/23  Yes Dameron, Nolberto Hanlon, DO  oxyCODONE (ROXICODONE) 5 MG immediate release tablet Take 0.5 tablets (2.5 mg total) by mouth 2 (two) times daily as needed for  severe pain (pain score 7-10). 01/28/24  Yes Vonna Drafts, MD  polyethylene glycol powder (GLYCOLAX/MIRALAX) 17 GM/SCOOP powder Dissolve 1 capful  (17 g) in water and take by mouth daily. 01/27/24  Yes Vonna Drafts, MD  rosuvastatin (CRESTOR) 10 MG tablet Take 1 tablet (10 mg total) by mouth daily. 01/27/24  Yes Vonna Drafts, MD  sertraline (ZOLOFT) 25 MG tablet Take 1 tablet (25 mg total) by mouth daily. 02/04/24  Yes Elberta Fortis, MD  diclofenac Sodium (VOLTAREN) 1 % GEL Apply 2 g topically 4 (four) times daily as needed (pain). Patient not taking: Reported on 02/06/2024 11/18/23   Alfredo Martinez, MD    Physical Exam: Vitals:   02/06/24 2028 02/06/24 2038  BP:  (!) 125/92  Pulse:  89  Resp:  18  Temp:  98.1 F (36.7 C)  TempSrc:  Axillary  SpO2:  100%  Weight: 46.3 kg   Height: 5\' 3"  (1.6 m)    General: Patient is restful, arousable.  Interacts reasonably well.  But obviously not oriented to location date or time.  He is able to give me her name and date of birth. Respiratory exam: Bilateral air entry vesicular Cardiovascular exam S1-S2 normal Abdomen soft nontender Extremities warm without edema without any focal deficit. Thin lady. Data Reviewed:  Labs on Admission:  Results for orders placed or performed during the hospital encounter of 02/06/24 (from the past 24 hours)  CBG monitoring, ED     Status: Abnormal   Collection Time: 02/06/24  8:35 PM  Result Value Ref Range   Glucose-Capillary >600 (HH) 70 - 99 mg/dL  CBC     Status: Abnormal   Collection Time: 02/06/24  8:39 PM  Result Value Ref Range   WBC 14.6 (H) 4.0 - 10.5 K/uL   RBC 3.54 (L) 3.87 - 5.11 MIL/uL   Hemoglobin 11.9 (L) 12.0 - 15.0 g/dL   HCT 34.7 42.5 - 95.6 %   MCV 103.1 (H) 80.0 - 100.0 fL   MCH 33.6 26.0 - 34.0 pg   MCHC 32.6 30.0 - 36.0 g/dL   RDW 38.7 56.4 - 33.2 %   Platelets 188 150 - 400 K/uL   nRBC 0.0 0.0 - 0.2 %  CBG monitoring, ED     Status: Abnormal   Collection Time: 02/06/24  9:46 PM   Result Value Ref Range   Glucose-Capillary >600 (HH) 70 - 99 mg/dL  Basic metabolic panel     Status: Abnormal   Collection Time: 02/06/24 10:08 PM  Result Value Ref Range   Sodium 129 (L) 135 - 145 mmol/L   Potassium 4.6 3.5 - 5.1 mmol/L   Chloride 92 (L) 98 - 111 mmol/L   CO2 25 22 - 32 mmol/L   Glucose, Bld 746 (HH) 70 - 99 mg/dL   BUN 12 8 - 23 mg/dL   Creatinine, Ser 9.51 0.44 - 1.00 mg/dL   Calcium 9.0 8.9 - 88.4 mg/dL   GFR, Estimated >16 >60 mL/min   Anion gap 12 5 - 15  Beta-hydroxybutyric acid     Status: Abnormal   Collection Time: 02/06/24 10:08 PM  Result Value Ref Range   Beta-Hydroxybutyric Acid 0.65 (H) 0.05 - 0.27 mmol/L  I-Stat venous blood gas, ([facility] ED, [facility], [facility])     Status: Abnormal   Collection Time: 02/06/24 10:09 PM  Result Value Ref Range   pH, Ven 7.389 7.25 - 7.43   pCO2, Ven 48.7 44 - 60 mmHg   pO2, Ven 26 (LL) 32 - 45 mmHg   Bicarbonate 29.4 (H) 20.0 - 28.0 mmol/L   TCO2 31 22 - 32 mmol/L   O2 Saturation 47 %   Acid-Base Excess 4.0 (H) 0.0 - 2.0 mmol/L   Sodium 129 (L) 135 - 145 mmol/L   Potassium 4.6 3.5 - 5.1 mmol/L   Calcium, Ion 1.15 1.15 - 1.40 mmol/L   HCT 37.0 36.0 - 46.0 %   Hemoglobin 12.6 12.0 - 15.0 g/dL   Sample type VENOUS    Comment NOTIFIED PHYSICIAN   I-stat chem 8, ed     Status: Abnormal   Collection Time: 02/06/24 10:09 PM  Result Value Ref Range   Sodium 130 (L) 135 - 145 mmol/L   Potassium 4.6 3.5 - 5.1 mmol/L  Chloride 92 (L) 98 - 111 mmol/L   BUN 12 8 - 23 mg/dL   Creatinine, Ser 8.11 0.44 - 1.00 mg/dL   Glucose, Bld >914 (HH) 70 - 99 mg/dL   Calcium, Ion 7.82 (L) 1.15 - 1.40 mmol/L   TCO2 28 22 - 32 mmol/L   Hemoglobin 12.6 12.0 - 15.0 g/dL   HCT 95.6 21.3 - 08.6 %   Comment NOTIFIED PHYSICIAN    Basic Metabolic Panel: Recent Labs  Lab 02/06/24 2208 02/06/24 2209  NA 129* 129*  130*  K 4.6 4.6  4.6  CL 92* 92*  CO2 25  --   GLUCOSE 746* >700*  BUN 12 12  CREATININE 0.93 0.80  CALCIUM 9.0  --     Liver Function Tests: No results for input(s): "AST", "ALT", "ALKPHOS", "BILITOT", "PROT", "ALBUMIN" in the last 168 hours. No results for input(s): "LIPASE", "AMYLASE" in the last 168 hours. No results for input(s): "AMMONIA" in the last 168 hours. CBC: Recent Labs  Lab 02/06/24 2039 02/06/24 2209  WBC 14.6*  --   HGB 11.9* 12.6  12.6  HCT 36.5 37.0  37.0  MCV 103.1*  --   PLT 188  --    Cardiac Enzymes: No results for input(s): "CKTOTAL", "CKMB", "CKMBINDEX", "TROPONINIHS" in the last 168 hours.  BNP (last 3 results) No results for input(s): "PROBNP" in the last 8760 hours. CBG: Recent Labs  Lab 02/01/24 1630 02/02/24 0752 02/03/24 0827 02/06/24 2035 02/06/24 2146  GLUCAP 265* 285* 388* >600* >600*    Radiological Exams on Admission:   No intake/output data recorded. No intake/output data recorded.      Assessment and Plan: Long-term insulin use in type 2 diabetes (HCC) Please review HPI, my suspicion is that the patient probably received no insulin last 3 days till her glucose was checked today.  Patient seems to have marked hyperglycemia with presenting blood glucose of 746 with mild diabetic ketoacidosis.  Patient started on insulin infusion which will be continued.  Till patient glucose under 250.  At which time diet will be ordered for the patient. Monitor BMP Q4 hr  Given marked leukocytosis, and patient's inability to tell us about her symptoms.  I will check a chest x-ray as well as blood cultures as well as follow-up the urinalysis that is pending.  Patient will be started empirically on Unasyn.  Check troponin    Home med reconciliation as below: Continue with oxycodone 2.5 mg twice daily as needed severe pain Continue with gabapentin 400 mg nightly Continue with Remeron 7.5 daily at bedtime Continue with Zoloft 25 mg daily Hold insulin sliding scale while patient is on insulin infusion Continue with Crestor Continue with Coreg Continue with  MiraLAX daily hold hiprex (?recurent uti) while on iv abx Continue with Reglan 10 mg 3 times a day before meals and at bedtime.  I suspect this is given to the patient for her severe gastroparesis as listed above Continue with famotidine.   Advance Care Planning:   Code Status: Prior full code based on MOLST form present at bedside dated 02/05/2024  Consults: consider diabetic consult.  Family Communication: in AM  Severity of Illness: The appropriate patient status for this patient is INPATIENT. Inpatient status is judged to be reasonable and necessary in order to provide the required intensity of service to ensure the patient's safety. The patient's presenting symptoms, physical exam findings, and initial radiographic and laboratory data in the context of their chronic comorbidities is felt to  place them at high risk for further clinical deterioration. Furthermore, it is not anticipated that the patient will be medically stable for discharge from the hospital within 2 midnights of admission.   * I certify that at the point of admission it is my clinical judgment that the patient will require inpatient hospital care spanning beyond 2 midnights from the point of admission due to high intensity of service, high risk for further deterioration and high frequency of surveillance required.*  Author: Nolberto Hanlon, MD 02/06/2024 11:49 PM  For on call review [email or web address].

## 2024-02-06 NOTE — Progress Notes (Signed)
Pharmacy Antibiotic Note  Judith [name] is a 83 y.o. female admitted on 02/06/2024 with hyperglycemia and concerns for aspiration pneumonia. PMH includes chronic cognitive communication deficit. Pharmacy has been consulted for Unasyn dosing.  -WBC 14.6, sCr 0.8, afebrile -No antibiotics given -Blood cultures ordered  Plan: -Unasyn 3g IV every 6h  -Monitor renal function -Follow up signs of clinical improvement, CXR, LOT, de-escalation of antibiotics    Height: 5\' 3"  (160 cm) Weight: 46.3 kg (102 lb) IBW/kg (Calculated) : 52.4  Temp (24hrs), Avg:98.1 F (36.7 C), Min:98.1 F (36.7 C), Max:98.1 F (36.7 C)  Recent Labs  Lab 02/06/24 2039 02/06/24 2208 02/06/24 2209  WBC 14.6*  --   --   CREATININE  --  0.93 0.80    Estimated Creatinine Clearance: 38.9 mL/min (by C-G formula based on SCr of 0.8 mg/dL).    Allergies  Allergen Reactions   Asa [Aspirin] Other (See Comments)    Causes ulcers    Motrin [Ibuprofen] Other (See Comments)    GI intolerance   Cipro [Ciprofloxacin Hcl] Rash    Ok to use eye drop formulation   Sulfa Antibiotics Other (See Comments)    GI issues    Antimicrobials this admission: Unasyn 2/14 >>   Microbiology results: 2/14 BCx:   Thank you for allowing pharmacy to be a part of this patient's care.  Arabella Merles, PharmD. Clinical Pharmacist 02/07/2024 12:14 AM

## 2024-02-06 NOTE — ED Provider Notes (Signed)
[place] EMERGENCY DEPARTMENT AT [facility] Provider Note   CSN: [id number] Arrival date & time: 02/06/24  2025     History  Chief Complaint  Patient presents with   Hyperglycemia    Judith [name] is a 83 y.o. female.  Patient here for [facility] with high blood sugar.  Ultibro high readings despite insulin at home.  She has no complaints.  She denies any nausea vomiting.  Denies any abdominal pain denies any weakness numbness tingling.  History of diabetes, gastroparesis, high cholesterol hypertension.  Denies any pain with urination.  Denies any fever chills or cough.  The history is provided by the patient.       Home Medications Prior to Admission medications   Medication Sig Start Date End Date Taking? Authorizing Provider  carvedilol (COREG) 6.25 MG tablet Take 1 tablet (6.25 mg total) by mouth 2 (two) times daily with a meal. 02/03/24  Yes Elberta Fortis, MD  famotidine (PEPCID) 20 MG tablet Take 1 tablet (20 mg total) by mouth at bedtime. Patient taking differently: Take 20 mg by mouth every evening. 06/11/19  Yes Lezlie Lye, Meda Coffee, MD  gabapentin (NEURONTIN) 400 MG capsule Take 400 mg by mouth at bedtime.   Yes [provider]  insulin lispro (HUMALOG) 100 UNIT/ML injection Inject 0-12 Units into the skin 4 (four) times daily -  with meals and at bedtime.   Yes [provider]  methenamine (HIPREX) 1 g tablet Take 1 g by mouth 2 (two) times daily. 01/21/24 01/20/25 Yes [provider]  metoCLOPramide (REGLAN) 10 MG tablet Take 1 tablet (10 mg total) by mouth 4 (four) times daily -  before meals and at bedtime. Take 1 tablet 10 mg by mouth 3 times/day before meals and at bedtime prn 09/09/23 07/21/24 Yes Pyrtle, Carie Caddy, MD  mirtazapine (REMERON) 7.5 MG tablet Take 1 tablet (7.5 mg total) by mouth at bedtime. 11/12/23  Yes Dameron, Nolberto Hanlon, DO  oxyCODONE (ROXICODONE) 5 MG immediate release tablet Take 0.5 tablets (2.5 mg total) by mouth  2 (two) times daily as needed for severe pain (pain score 7-10). 01/28/24  Yes Vonna Drafts, MD  polyethylene glycol powder (GLYCOLAX/MIRALAX) 17 GM/SCOOP powder Dissolve 1 capful  (17 g) in water and take by mouth daily. 01/27/24  Yes Vonna Drafts, MD  rosuvastatin (CRESTOR) 10 MG tablet Take 1 tablet (10 mg total) by mouth daily. 01/27/24  Yes Vonna Drafts, MD  sertraline (ZOLOFT) 25 MG tablet Take 1 tablet (25 mg total) by mouth daily. 02/04/24  Yes Elberta Fortis, MD  diclofenac Sodium (VOLTAREN) 1 % GEL Apply 2 g topically 4 (four) times daily as needed (pain). Patient not taking: Reported on 02/06/2024 11/18/23   Alfredo Martinez, MD      Allergies    Asa [aspirin], Motrin [ibuprofen], Cipro [ciprofloxacin hcl], and Sulfa antibiotics    Review of Systems   Review of Systems  Physical Exam Updated Vital Signs BP (!) 125/92 (BP Location: Left Arm)   Pulse 89   Temp 98.1 F (36.7 C) (Axillary)   Resp 18   Ht 5\' 3"  (1.6 m)   Wt 46.3 kg   SpO2 100%   BMI 18.07 kg/m  Physical Exam Vitals and nursing note reviewed.  Constitutional:      General: She is not in acute distress.    Appearance: She is well-developed. She is not ill-appearing.  HENT:     Head: Normocephalic and atraumatic.     Nose:  Nose normal.     Mouth/Throat:     Mouth: Mucous membranes are moist.  Eyes:     Extraocular Movements: Extraocular movements intact.     Conjunctiva/sclera: Conjunctivae normal.     Pupils: Pupils are equal, round, and reactive to light.  Cardiovascular:     Rate and Rhythm: Normal rate and regular rhythm.     Pulses: Normal pulses.     Heart sounds: Normal heart sounds. No murmur heard. Pulmonary:     Effort: Pulmonary effort is normal. No respiratory distress.     Breath sounds: Normal breath sounds.  Abdominal:     General: Abdomen is flat.     Palpations: Abdomen is soft.     Tenderness: There is no abdominal tenderness.  Musculoskeletal:        General: No swelling.      Cervical back: Normal range of motion and neck supple.  Skin:    General: Skin is warm and dry.     Capillary Refill: Capillary refill takes less than 2 seconds.  Neurological:     General: No focal deficit present.     Mental Status: She is alert and oriented to person, place, and time.     Cranial Nerves: No cranial nerve deficit.     Sensory: No sensory deficit.     Motor: No weakness.     Coordination: Coordination normal.     Comments: 5+ out of 5 strength throughout, normal sensation, no drift  Psychiatric:        Mood and Affect: Mood normal.     ED Results / Procedures / Treatments   Labs (all labs ordered are listed, but only abnormal results are displayed) Labs Reviewed  CBC - Abnormal; Notable for the following components:      Result Value   WBC 14.6 (*)    RBC 3.54 (*)    Hemoglobin 11.9 (*)    MCV 103.1 (*)    All other components within normal limits  BASIC METABOLIC PANEL - Abnormal; Notable for the following components:   Sodium 129 (*)    Chloride 92 (*)    Glucose, Bld 746 (*)    All other components within normal limits  BETA-HYDROXYBUTYRIC ACID - Abnormal; Notable for the following components:   Beta-Hydroxybutyric Acid 0.65 (*)    All other components within normal limits  CBG MONITORING, ED - Abnormal; Notable for the following components:   Glucose-Capillary >600 (*)    All other components within normal limits  I-STAT VENOUS BLOOD GAS, ED - Abnormal; Notable for the following components:   pO2, Ven 26 (*)    Bicarbonate 29.4 (*)    Acid-Base Excess 4.0 (*)    Sodium 129 (*)    All other components within normal limits  CBG MONITORING, ED - Abnormal; Notable for the following components:   Glucose-Capillary >600 (*)    All other components within normal limits  I-STAT CHEM 8, ED - Abnormal; Notable for the following components:   Sodium 130 (*)    Chloride 92 (*)    Glucose, Bld >700 (*)    Calcium, Ion 1.08 (*)    All other components  within normal limits  URINALYSIS, ROUTINE W REFLEX MICROSCOPIC    EKG None  Radiology No results found.  Procedures .Critical Care  Performed by: Virgina Norfolk, DO Authorized by: Virgina Norfolk, DO   Critical care provider statement:    Critical care time (minutes):  35   Critical care was  necessary to treat or prevent imminent or life-threatening deterioration of the following conditions:  Endocrine crisis   Critical care was time spent personally by me on the following activities:  Blood draw for specimens, development of treatment plan with patient or surrogate, discussions with primary provider, examination of patient, evaluation of patient's response to treatment, obtaining history from patient or surrogate, ordering and performing treatments and interventions, ordering and review of laboratory studies, ordering and review of radiographic studies, pulse oximetry, re-evaluation of patient's condition and review of old charts   Care discussed with: admitting provider       Medications Ordered in ED Medications  insulin regular, human (MYXREDLIN) 100 units/ 100 mL infusion (has no administration in time range)  lactated ringers infusion (has no administration in time range)  dextrose 5 % in lactated ringers infusion (has no administration in time range)  dextrose 50 % solution 0-50 mL (has no administration in time range)  potassium chloride 10 mEq in 100 mL IVPB (has no administration in time range)  lactated ringers bolus 1,000 mL (has no administration in time range)  sodium chloride 0.9 % bolus 1,000 mL (1,000 mLs Intravenous New Bag/Given 02/06/24 2211)    ED Course/ Medical Decision Making/ A&P                                 Medical Decision Making Amount and/or Complexity of Data Reviewed Labs: ordered.  Risk Prescription drug management. Decision regarding hospitalization.   [name] is here with high blood sugar.  Multiple high blood sugar readings at  home.  Blood sugar here today 749.  Differential diagnosis likely hyperglycemia due to poor medication, may be gastroparesis electrolyte abnormality.  Overall will evaluate for DKA will check labs.  She has no real abdominal tenderness on exam.  She is well-appearing otherwise.  Maybe some mild confusion.  Overall lab work shows blood sugar 749 the patient is not in DKA.  However we will put her on insulin given significant hyperglycemia.  She has been given 2 L IV fluids.  Will admit her to the medicine for further hyperglycemia care.  Repeat exams unremarkable.  Urinalysis is pending.  Admitted in good condition.  This chart was dictated using voice recognition software.  Despite best efforts to proofread,  errors can occur which can change the documentation meaning.         Final Clinical Impression(s) / ED Diagnoses Final diagnoses:  Hyperglycemia    Rx / DC Orders ED Discharge Orders     None         Virgina Norfolk, DO 02/06/24 2319

## 2024-02-06 NOTE — H&P (Incomplete)
History and Physical    Patient: Judith [name] ZOX:[id number] DOB: 20-Jun-1941 DOA: 02/06/2024 DOS: the patient was seen and examined on 02/06/2024 PCP: Darral Dash, DO  Patient coming from: SNF  Chief Complaint:  Chief Complaint  Patient presents with  . Hyperglycemia   HPI: Judith [name] is a 83 y.o. female with medical history significant of documented chronic cognitive communication deficit.  Patient further has history of type 2 diabetes mellitus with diabetic neuropathy.  Other medication problems as listed below.  Patient is actually a resident of [facility] from where patient is sent to [facility], ER today.  Review of record indicates that the patient was discharged from [facility] on 05/02/2024.  At the time patient was admitted to our hospital system for an unwitnessed fall urinary tract infection.  These were managed conservatively.  Review of medication list indicates that the patient was not discharged on any insulin.  My understanding is that based on patient's diagnosis of diabetes mellitus, she was started on insulin sliding scale today.  During initial blood glucose check, this evening, patient's blood sugar was found to be high.  Patient got subcutaneous insulin and was rechecked 2 hours later without any change in blood sugar.  EMS there was called and the patient was dispatched to [facility], ER.  Unfortunately due to patient's chronic cognitive deficit, history from the patient is very poor/not available.  There is no report of patient having had any vomiting diarrhea polydipsia polyuria.  Glucose here in the ER is noted close to 800.  Patient is ordered for continuous IV insulin infusion.  Medical evaluation is sought.  Patient offers no complaints. Review of Systems: unable to review all systems due to the inability of the patient to answer questions. Past Medical History:  Diagnosis Date  . Abdominal pain 06/13/2023  . Allergy  1990  . Anemia   . Arthritis   . Biliary gastritis   . Blood transfusion without reported diagnosis    2007  . Cervical spine arthritis 04/05/2016  . Choledocholithiasis   . Controlled type 2 diabetes mellitus with diabetic neuropathy, with long-term current use of insulin (HCC)   . Depression   . Diabetes mellitus type 2 with complications (HCC)   . Dilated bile duct   . Duodenal ulcer disease   . Elevated liver function tests   . Encounter for long-term (current) use of insulin (HCC)   . Encounter to establish care with new doctor 08/07/2022  . Fall 01/11/2023  . Gallstones 01/17/2018  . Gastric ulcer   . Gastritis and gastroduodenitis   . Gastroparesis    severe  . GERD (gastroesophageal reflux disease)   . Gout    tested for gout but was not  . Heart murmur   . Hiatal hernia   . Hyperlipidemia    in past not now  . Hypertension   . Hypokalemia, gastrointestinal losses    secondary to severe gastroparesis  . Internal hemorrhoids   . Long-term insulin use in type 2 diabetes (HCC) 04/05/2016  . Osteoporosis    just had bone density test possible   . Peripheral neuropathy   . Postnasal drip 11/01/2022  . Presence of cardiac pacemaker   . S/P partial gastrectomy    due to severe gastric and gudoenal ulcers  . Seasonal allergies   . Sleep apnea    could not use CPAP  . UTI (urinary tract infection)    Past Surgical History:  Procedure Laterality Date  . BILIARY DILATION  02/01/2022   Procedure: BILIARY DILATION;  Surgeon: Meridee Score Netty Starring., MD;  Location: [facility] ENDOSCOPY;  Service: Gastroenterology;;  . BILIARY DILATION  04/09/2022   Procedure: BILIARY DILATION;  Surgeon: Lemar Lofty., MD;  Location: [facility] ENDOSCOPY;  Service: Gastroenterology;;  . BILIARY DILATION  06/13/2023   Procedure: BILIARY DILATION;  Surgeon: Lemar Lofty., MD;  Location: [facility] ENDOSCOPY;  Service: Gastroenterology;;  . BILIARY STENT PLACEMENT N/A 11/30/2021   Procedure:  BILIARY STENT PLACEMENT;  Surgeon: Lemar Lofty., MD;  Location: [facility] ENDOSCOPY;  Service: Gastroenterology;  Laterality: N/A;  . BILIARY STENT PLACEMENT  02/01/2022   Procedure: BILIARY STENT PLACEMENT;  Surgeon: Meridee Score Netty Starring., MD;  Location: [facility] ENDOSCOPY;  Service: Gastroenterology;;  . BIOPSY  11/30/2021   Procedure: BIOPSY;  Surgeon: Lemar Lofty., MD;  Location: [facility] ENDOSCOPY;  Service: Gastroenterology;;  . BIOPSY  04/09/2022   Procedure: BIOPSY;  Surgeon: Lemar Lofty., MD;  Location: [facility] ENDOSCOPY;  Service: Gastroenterology;;  . CHOLECYSTECTOMY N/A 12/06/2021   Procedure: LAPAROSCOPIC CHOLECYSTECTOMY, LYSIS OF ADHESIONS;  Surgeon: Harriette Bouillon, MD;  Location: [facility] ORS;  Service: General;  Laterality: N/A;  . COLONOSCOPY  02/11/2018   no polyps, + small internal hemorrhoids  . ENDOSCOPIC RETROGRADE CHOLANGIOPANCREATOGRAPHY (ERCP) WITH PROPOFOL N/A 11/30/2021   Procedure: ENDOSCOPIC RETROGRADE CHOLANGIOPANCREATOGRAPHY (ERCP) WITH PROPOFOL;  Surgeon: Meridee Score Netty Starring., MD;  Location: [facility] ENDOSCOPY;  Service: Gastroenterology;  Laterality: N/A;  . ENDOSCOPIC RETROGRADE CHOLANGIOPANCREATOGRAPHY (ERCP) WITH PROPOFOL N/A 02/01/2022   Procedure: ENDOSCOPIC RETROGRADE CHOLANGIOPANCREATOGRAPHY (ERCP) WITH PROPOFOL;  Surgeon: Meridee Score Netty Starring., MD;  Location[facility] ENDOSCOPY;  Service: Gastroenterology;  Laterality: N/A;  . ENDOSCOPIC RETROGRADE CHOLANGIOPANCREATOGRAPHY (ERCP) WITH PROPOFOL N/A 04/09/2022   Procedure: ENDOSCOPIC RETROGRADE CHOLANGIOPANCREATOGRAPHY (ERCP) WITH PROPOFOL;  Surgeon: Meridee Score Netty Starring., MD;  Location: [facility] ENDOSCOPY;  Service: Gastroenterology;  Laterality: N/A;  . ENDOSCOPIC RETROGRADE CHOLANGIOPANCREATOGRAPHY (ERCP) WITH PROPOFOL N/A 06/13/2023   Procedure: ENDOSCOPIC RETROGRADE CHOLANGIOPANCREATOGRAPHY (ERCP) WITH PROPOFOL;  Surgeon: Meridee Score Netty Starring., MD;  Location: [facility] ENDOSCOPY;  Service: Gastroenterology;   Laterality: N/A;  . ESOPHAGOGASTRODUODENOSCOPY (EGD) WITH PROPOFOL N/A 11/30/2021   Procedure: ESOPHAGOGASTRODUODENOSCOPY (EGD) WITH PROPOFOL;  Surgeon: Meridee Score Netty Starring., MD;  Location: [facility] ENDOSCOPY;  Service: Gastroenterology;  Laterality: N/A;  . EUS N/A 12/12/2017   Procedure: UPPER ENDOSCOPIC ULTRASOUND (EUS) RADIAL;  Surgeon: Rachael Fee, MD;  Location: [facility] ENDOSCOPY;  Service: Endoscopy;  Laterality: N/A;  . EYE SURGERY  2024  . GASTRECTOMY    . JOINT REPLACEMENT    . pace maker    . PARTIAL GASTRECTOMY    . PPM GENERATOR CHANGEOUT N/A 04/25/2023   Procedure: PPM GENERATOR CHANGEOUT;  Surgeon: Thurmon Fair, MD;  Location: [facility] INVASIVE CV LAB;  Service: Cardiovascular;  Laterality: N/A;  . REMOVAL OF STONES  02/01/2022   Procedure: REMOVAL OF STONES;  Surgeon: Meridee Score Netty Starring., MD;  Location: [facility] ENDOSCOPY;  Service: Gastroenterology;;  . REMOVAL OF STONES  04/09/2022   Procedure: REMOVAL OF STONES;  Surgeon: Lemar Lofty., MD;  Location: [facility] ENDOSCOPY;  Service: Gastroenterology;;  . REMOVAL OF STONES  06/13/2023   Procedure: REMOVAL OF STONES;  Surgeon: Lemar Lofty., MD;  Location: [facility] ENDOSCOPY;  Service: Gastroenterology;;  . Dennison Mascot  11/30/2021   Procedure: Dennison Mascot;  Surgeon: Lemar Lofty., MD;  Location: [facility] ENDOSCOPY;  Service: Gastroenterology;;  . Francine Graven REMOVAL  02/01/2022   Procedure: STENT REMOVAL;  Surgeon: Lemar Lofty., MD;  Location: [facility] ENDOSCOPY;  Service: Gastroenterology;;  .  STENT REMOVAL  04/09/2022   Procedure: STENT REMOVAL;  Surgeon: Lemar Lofty., MD;  Location: [facility] ENDOSCOPY;  Service: Gastroenterology;;  . Sunnie Nielsen TATTOO INJECTION  11/30/2021   Procedure: SUBMUCOSAL TATTOO INJECTION;  Surgeon: Lemar Lofty., MD;  Location: [facility] ENDOSCOPY;  Service: Gastroenterology;;  . TONSILLECTOMY     45 years ago  . UPPER GASTROINTESTINAL ENDOSCOPY     Social History:  reports  that she has never smoked. She has never used smokeless tobacco. She reports that she does not drink alcohol and does not use drugs.  Allergies  Allergen Reactions  . Asa [Aspirin] Other (See Comments)    Causes ulcers   . Motrin [Ibuprofen] Other (See Comments)    GI intolerance  . Cipro [Ciprofloxacin Hcl] Rash    Ok to use eye drop formulation  . Sulfa Antibiotics Other (See Comments)    GI issues    Family History  Problem Relation Age of Onset  . Diabetes Mother   . Diabetes Father   . Heart disease Father   . Diabetes Sister   . Heart disease Son   . Colon cancer Neg Hx   . Colon polyps Neg Hx   . Rectal cancer Neg Hx   . Stomach cancer Neg Hx   . Esophageal cancer Neg Hx     Prior to Admission medications   Medication Sig Start Date End Date Taking? Authorizing Provider  carvedilol (COREG) 6.25 MG tablet Take 1 tablet (6.25 mg total) by mouth 2 (two) times daily with a meal. 02/03/24  Yes Elberta Fortis, MD  famotidine (PEPCID) 20 MG tablet Take 1 tablet (20 mg total) by mouth at bedtime. Patient taking differently: Take 20 mg by mouth every evening. 06/11/19  Yes Lezlie Lye, Meda Coffee, MD  gabapentin (NEURONTIN) 400 MG capsule Take 400 mg by mouth at bedtime.   Yes [provider]  insulin lispro (HUMALOG) 100 UNIT/ML injection Inject 0-12 Units into the skin 4 (four) times daily -  with meals and at bedtime.   Yes [provider]  methenamine (HIPREX) 1 g tablet Take 1 g by mouth 2 (two) times daily. 01/21/24 01/20/25 Yes [provider]  metoCLOPramide (REGLAN) 10 MG tablet Take 1 tablet (10 mg total) by mouth 4 (four) times daily -  before meals and at bedtime. Take 1 tablet 10 mg by mouth 3 times/day before meals and at bedtime prn 09/09/23 07/21/24 Yes Pyrtle, Carie Caddy, MD  mirtazapine (REMERON) 7.5 MG tablet Take 1 tablet (7.5 mg total) by mouth at bedtime. 11/12/23  Yes Dameron, Nolberto Hanlon, DO  oxyCODONE (ROXICODONE) 5 MG immediate release tablet  Take 0.5 tablets (2.5 mg total) by mouth 2 (two) times daily as needed for severe pain (pain score 7-10). 01/28/24  Yes Vonna Drafts, MD  polyethylene glycol powder (GLYCOLAX/MIRALAX) 17 GM/SCOOP powder Dissolve 1 capful  (17 g) in water and take by mouth daily. 01/27/24  Yes Vonna Drafts, MD  rosuvastatin (CRESTOR) 10 MG tablet Take 1 tablet (10 mg total) by mouth daily. 01/27/24  Yes Vonna Drafts, MD  sertraline (ZOLOFT) 25 MG tablet Take 1 tablet (25 mg total) by mouth daily. 02/04/24  Yes Elberta Fortis, MD  diclofenac Sodium (VOLTAREN) 1 % GEL Apply 2 g topically 4 (four) times daily as needed (pain). Patient not taking: Reported on 02/06/2024 11/18/23   Alfredo Martinez, MD    Physical Exam: Vitals:   02/06/24 2028 02/06/24 2038  BP:  (!) 125/92  Pulse:  89  Resp:  18  Temp:  98.1 F (36.7 C)  TempSrc:  Axillary  SpO2:  100%  Weight: 46.3 kg   Height: 5\' 3"  (1.6 m)    General: Patient is restful, arousable.  Interacts reasonably well.  But obviously not oriented to location date or time.  He is able to give me her name and date of birth. Respiratory exam: Bilateral air entry vesicular Cardiovascular exam S1-S2 normal Abdomen soft nontender Extremities warm without edema without any focal deficit. Thin lady. Data Reviewed: {Tip this will not be part of the note when signed- Document your independent interpretation of telemetry tracing, EKG, lab, Radiology test or any other diagnostic tests. Add any new diagnostic test ordered today. (Optional):[id number]} Labs on Admission:  Results for orders placed or performed during the hospital encounter of 02/06/24 (from the past 24 hours)  CBG monitoring, ED     Status: Abnormal   Collection Time: 02/06/24  8:35 PM  Result Value Ref Range   Glucose-Capillary >600 (HH) 70 - 99 mg/dL  CBC     Status: Abnormal   Collection Time: 02/06/24  8:39 PM  Result Value Ref Range   WBC 14.6 (H) 4.0 - 10.5 K/uL   RBC 3.54 (L) 3.87 - 5.11 MIL/uL    Hemoglobin 11.9 (L) 12.0 - 15.0 g/dL   HCT 96.2 95.2 - 84.1 %   MCV 103.1 (H) 80.0 - 100.0 fL   MCH 33.6 26.0 - 34.0 pg   MCHC 32.6 30.0 - 36.0 g/dL   RDW 32.4 40.1 - 02.7 %   Platelets 188 150 - 400 K/uL   nRBC 0.0 0.0 - 0.2 %  CBG monitoring, ED     Status: Abnormal   Collection Time: 02/06/24  9:46 PM  Result Value Ref Range   Glucose-Capillary >600 (HH) 70 - 99 mg/dL  Basic metabolic panel     Status: Abnormal   Collection Time: 02/06/24 10:08 PM  Result Value Ref Range   Sodium 129 (L) 135 - 145 mmol/L   Potassium 4.6 3.5 - 5.1 mmol/L   Chloride 92 (L) 98 - 111 mmol/L   CO2 25 22 - 32 mmol/L   Glucose, Bld 746 (HH) 70 - 99 mg/dL   BUN 12 8 - 23 mg/dL   Creatinine, Ser 2.53 0.44 - 1.00 mg/dL   Calcium 9.0 8.9 - 66.4 mg/dL   GFR, Estimated >40 >34 mL/min   Anion gap 12 5 - 15  Beta-hydroxybutyric acid     Status: Abnormal   Collection Time: 02/06/24 10:08 PM  Result Value Ref Range   Beta-Hydroxybutyric Acid 0.65 (H) 0.05 - 0.27 mmol/L  I-Stat venous blood gas, ([facility] ED, [facility], [facility])     Status: Abnormal   Collection Time: 02/06/24 10:09 PM  Result Value Ref Range   pH, Ven 7.389 7.25 - 7.43   pCO2, Ven 48.7 44 - 60 mmHg   pO2, Ven 26 (LL) 32 - 45 mmHg   Bicarbonate 29.4 (H) 20.0 - 28.0 mmol/L   TCO2 31 22 - 32 mmol/L   O2 Saturation 47 %   Acid-Base Excess 4.0 (H) 0.0 - 2.0 mmol/L   Sodium 129 (L) 135 - 145 mmol/L   Potassium 4.6 3.5 - 5.1 mmol/L   Calcium, Ion 1.15 1.15 - 1.40 mmol/L   HCT 37.0 36.0 - 46.0 %   Hemoglobin 12.6 12.0 - 15.0 g/dL   Sample type VENOUS    Comment NOTIFIED PHYSICIAN   I-stat chem 8, ed  Status: Abnormal   Collection Time: 02/06/24 10:09 PM  Result Value Ref Range   Sodium 130 (L) 135 - 145 mmol/L   Potassium 4.6 3.5 - 5.1 mmol/L   Chloride 92 (L) 98 - 111 mmol/L   BUN 12 8 - 23 mg/dL   Creatinine, Ser 1.61 0.44 - 1.00 mg/dL   Glucose, Bld >096 (HH) 70 - 99 mg/dL   Calcium, Ion 0.45 (L) 1.15 - 1.40 mmol/L   TCO2 28 22 - 32 mmol/L    Hemoglobin 12.6 12.0 - 15.0 g/dL   HCT 40.9 81.1 - 91.4 %   Comment NOTIFIED PHYSICIAN    Basic Metabolic Panel: Recent Labs  Lab 02/06/24 2208 02/06/24 2209  NA 129* 129*  130*  K 4.6 4.6  4.6  CL 92* 92*  CO2 25  --   GLUCOSE 746* >700*  BUN 12 12  CREATININE 0.93 0.80  CALCIUM 9.0  --    Liver Function Tests: No results for input(s): "AST", "ALT", "ALKPHOS", "BILITOT", "PROT", "ALBUMIN" in the last 168 hours. No results for input(s): "LIPASE", "AMYLASE" in the last 168 hours. No results for input(s): "AMMONIA" in the last 168 hours. CBC: Recent Labs  Lab 02/06/24 2039 02/06/24 2209  WBC 14.6*  --   HGB 11.9* 12.6  12.6  HCT 36.5 37.0  37.0  MCV 103.1*  --   PLT 188  --    Cardiac Enzymes: No results for input(s): "CKTOTAL", "CKMB", "CKMBINDEX", "TROPONINIHS" in the last 168 hours.  BNP (last 3 results) No results for input(s): "PROBNP" in the last 8760 hours. CBG: Recent Labs  Lab 02/01/24 1630 02/02/24 0752 02/03/24 0827 02/06/24 2035 02/06/24 2146  GLUCAP 265* 285* 388* >600* >600*    Radiological Exams on Admission:   No intake/output data recorded. No intake/output data recorded.      Assessment and Plan: Long-term insulin use in type 2 diabetes (HCC) Please review HPI, my suspicion is that the patient probably received no insulin last 3 days till her glucose was checked today.  Patient seems to have marked hyperglycemia with presenting blood glucose of 746 with mild diabetic ketoacidosis.  Patient started on insulin infusion which will be continued.  Till patient glucose under 250.  At which time diet will be ordered for the patient. Monitor BMP Q4 hr  Given marked leukocytosis, and patient's inability to tell us about her symptoms.  I will check a chest x-ray as well as blood cultures as well as follow-up the urinalysis that is pending.  Patient will be started empirically on Unasyn.  Check troponin      Advance Care Planning:   Code  Status: Prior ***  Consults: ***  Family Communication: ***  Severity of Illness: {Observation/Inpatient:21159}  Author: Nolberto Hanlon, MD 02/06/2024 11:49 PM  For on call review [email or web address].

## 2024-02-07 ENCOUNTER — Other Ambulatory Visit: Payer: Self-pay

## 2024-02-07 ENCOUNTER — Emergency Department (HOSPITAL_COMMUNITY): Payer: 59

## 2024-02-07 DIAGNOSIS — I2510 Atherosclerotic heart disease of native coronary artery without angina pectoris: Secondary | ICD-10-CM | POA: Diagnosis present

## 2024-02-07 DIAGNOSIS — I442 Atrioventricular block, complete: Secondary | ICD-10-CM | POA: Diagnosis present

## 2024-02-07 DIAGNOSIS — Z950 Presence of cardiac pacemaker: Secondary | ICD-10-CM | POA: Diagnosis not present

## 2024-02-07 DIAGNOSIS — F32A Depression, unspecified: Secondary | ICD-10-CM | POA: Diagnosis present

## 2024-02-07 DIAGNOSIS — Z886 Allergy status to analgesic agent status: Secondary | ICD-10-CM | POA: Diagnosis not present

## 2024-02-07 DIAGNOSIS — E1110 Type 2 diabetes mellitus with ketoacidosis without coma: Secondary | ICD-10-CM | POA: Diagnosis present

## 2024-02-07 DIAGNOSIS — Z881 Allergy status to other antibiotic agents status: Secondary | ICD-10-CM | POA: Diagnosis not present

## 2024-02-07 DIAGNOSIS — R7989 Other specified abnormal findings of blood chemistry: Secondary | ICD-10-CM

## 2024-02-07 DIAGNOSIS — E1143 Type 2 diabetes mellitus with diabetic autonomic (poly)neuropathy: Secondary | ICD-10-CM | POA: Diagnosis present

## 2024-02-07 DIAGNOSIS — K3184 Gastroparesis: Secondary | ICD-10-CM | POA: Diagnosis present

## 2024-02-07 DIAGNOSIS — Z66 Do not resuscitate: Secondary | ICD-10-CM | POA: Diagnosis present

## 2024-02-07 DIAGNOSIS — E1142 Type 2 diabetes mellitus with diabetic polyneuropathy: Secondary | ICD-10-CM | POA: Diagnosis present

## 2024-02-07 DIAGNOSIS — I10 Essential (primary) hypertension: Secondary | ICD-10-CM | POA: Diagnosis present

## 2024-02-07 DIAGNOSIS — I2489 Other forms of acute ischemic heart disease: Secondary | ICD-10-CM | POA: Diagnosis present

## 2024-02-07 DIAGNOSIS — Z7189 Other specified counseling: Secondary | ICD-10-CM | POA: Diagnosis not present

## 2024-02-07 DIAGNOSIS — Z87440 Personal history of urinary (tract) infections: Secondary | ICD-10-CM | POA: Diagnosis not present

## 2024-02-07 DIAGNOSIS — R739 Hyperglycemia, unspecified: Principal | ICD-10-CM | POA: Diagnosis present

## 2024-02-07 DIAGNOSIS — E7800 Pure hypercholesterolemia, unspecified: Secondary | ICD-10-CM | POA: Diagnosis present

## 2024-02-07 DIAGNOSIS — Z79899 Other long term (current) drug therapy: Secondary | ICD-10-CM | POA: Diagnosis not present

## 2024-02-07 DIAGNOSIS — Z9181 History of falling: Secondary | ICD-10-CM | POA: Diagnosis not present

## 2024-02-07 DIAGNOSIS — E876 Hypokalemia: Secondary | ICD-10-CM | POA: Diagnosis present

## 2024-02-07 DIAGNOSIS — Z8249 Family history of ischemic heart disease and other diseases of the circulatory system: Secondary | ICD-10-CM | POA: Diagnosis not present

## 2024-02-07 DIAGNOSIS — Z951 Presence of aortocoronary bypass graft: Secondary | ICD-10-CM | POA: Diagnosis not present

## 2024-02-07 DIAGNOSIS — R911 Solitary pulmonary nodule: Secondary | ICD-10-CM | POA: Diagnosis present

## 2024-02-07 DIAGNOSIS — Z833 Family history of diabetes mellitus: Secondary | ICD-10-CM | POA: Diagnosis not present

## 2024-02-07 DIAGNOSIS — Z515 Encounter for palliative care: Secondary | ICD-10-CM | POA: Diagnosis not present

## 2024-02-07 DIAGNOSIS — G8929 Other chronic pain: Secondary | ICD-10-CM | POA: Diagnosis present

## 2024-02-07 DIAGNOSIS — Z794 Long term (current) use of insulin: Secondary | ICD-10-CM | POA: Diagnosis not present

## 2024-02-07 LAB — BASIC METABOLIC PANEL
Anion gap: 13 (ref 5–15)
Anion gap: 12 (ref 5–15)
Anion gap: 7 (ref 5–15)
Anion gap: 9 (ref 5–15)
BUN: 11 mg/dL (ref 8–23)
BUN: 10 mg/dL (ref 8–23)
BUN: 5 mg/dL — ABNORMAL LOW (ref 8–23)
BUN: 6 mg/dL — ABNORMAL LOW (ref 8–23)
CO2: 24 mmol/L (ref 22–32)
CO2: 23 mmol/L (ref 22–32)
CO2: 19 mmol/L — ABNORMAL LOW (ref 22–32)
CO2: 25 mmol/L (ref 22–32)
Calcium: 8.8 mg/dL — ABNORMAL LOW (ref 8.9–10.3)
Calcium: 8.7 mg/dL — ABNORMAL LOW (ref 8.9–10.3)
Calcium: 6.4 mg/dL — CL (ref 8.9–10.3)
Calcium: 8.3 mg/dL — ABNORMAL LOW (ref 8.9–10.3)
Chloride: 94 mmol/L — ABNORMAL LOW (ref 98–111)
Chloride: 99 mmol/L (ref 98–111)
Chloride: 111 mmol/L (ref 98–111)
Chloride: 100 mmol/L (ref 98–111)
Creatinine, Ser: 0.83 mg/dL (ref 0.44–1.00)
Creatinine, Ser: 0.8 mg/dL (ref 0.44–1.00)
Creatinine, Ser: 0.41 mg/dL — ABNORMAL LOW (ref 0.44–1.00)
Creatinine, Ser: 0.6 mg/dL (ref 0.44–1.00)
GFR, Estimated: >60 mL/min (ref >60)
GFR, Estimated: >60 mL/min (ref >60)
GFR, Estimated: >60 mL/min (ref >60)
GFR, Estimated: >60 mL/min (ref >60)
Glucose, Bld: 651 mg/dL (ref 70–99)
Glucose, Bld: 391 mg/dL — ABNORMAL HIGH (ref 70–99)
Glucose, Bld: 173 mg/dL — ABNORMAL HIGH (ref 70–99)
Glucose, Bld: 213 mg/dL — ABNORMAL HIGH (ref 70–99)
Potassium: 4.1 mmol/L (ref 3.5–5.1)
Potassium: 3.8 mmol/L (ref 3.5–5.1)
Potassium: 3.1 mmol/L — ABNORMAL LOW (ref 3.5–5.1)
Potassium: 3.7 mmol/L (ref 3.5–5.1)
Sodium: 131 mmol/L — ABNORMAL LOW (ref 135–145)
Sodium: 134 mmol/L — ABNORMAL LOW (ref 135–145)
Sodium: 137 mmol/L (ref 135–145)
Sodium: 134 mmol/L — ABNORMAL LOW (ref 135–145)

## 2024-02-07 LAB — CBG MONITORING, ED
Glucose-Capillary: 153 mg/dL — ABNORMAL HIGH (ref 70–99)
Glucose-Capillary: 154 mg/dL — ABNORMAL HIGH (ref 70–99)
Glucose-Capillary: 259 mg/dL — ABNORMAL HIGH (ref 70–99)
Glucose-Capillary: 266 mg/dL — ABNORMAL HIGH (ref 70–99)
Glucose-Capillary: 358 mg/dL — ABNORMAL HIGH (ref 70–99)
Glucose-Capillary: 412 mg/dL — ABNORMAL HIGH (ref 70–99)
Glucose-Capillary: 443 mg/dL — ABNORMAL HIGH (ref 70–99)
Glucose-Capillary: 456 mg/dL — ABNORMAL HIGH (ref 70–99)
Glucose-Capillary: 496 mg/dL — ABNORMAL HIGH (ref 70–99)
Glucose-Capillary: 532 mg/dL (ref 70–99)
Glucose-Capillary: 547 mg/dL (ref 70–99)
Glucose-Capillary: 86 mg/dL (ref 70–99)

## 2024-02-07 LAB — TROPONIN I (HIGH SENSITIVITY)
Troponin I (High Sensitivity): 104 ng/L (ref <18)
Troponin I (High Sensitivity): 110 ng/L (ref <18)
Troponin I (High Sensitivity): 128 ng/L (ref <18)
Troponin I (High Sensitivity): 126 ng/L (ref <18)

## 2024-02-07 LAB — CBC
HCT: 28 % — ABNORMAL LOW (ref 36.0–46.0)
HCT: 31.8 % — ABNORMAL LOW (ref 36.0–46.0)
Hemoglobin: 10.6 g/dL — ABNORMAL LOW (ref 12.0–15.0)
Hemoglobin: 9.2 g/dL — ABNORMAL LOW (ref 12.0–15.0)
MCH: 33.3 pg (ref 26.0–34.0)
MCH: 33.4 pg (ref 26.0–34.0)
MCHC: 32.9 g/dL (ref 30.0–36.0)
MCHC: 33.3 g/dL (ref 30.0–36.0)
MCV: 100.3 fL — ABNORMAL HIGH (ref 80.0–100.0)
MCV: 101.4 fL — ABNORMAL HIGH (ref 80.0–100.0)
Platelets: 178 10*3/uL (ref 150–400)
Platelets: 198 10*3/uL (ref 150–400)
RBC: 2.76 MIL/uL — ABNORMAL LOW (ref 3.87–5.11)
RBC: 3.17 MIL/uL — ABNORMAL LOW (ref 3.87–5.11)
RDW: 14.3 % (ref 11.5–15.5)
RDW: 14.6 % (ref 11.5–15.5)
WBC: 11.3 10*3/uL — ABNORMAL HIGH (ref 4.0–10.5)
WBC: 14.1 10*3/uL — ABNORMAL HIGH (ref 4.0–10.5)
nRBC: 0 % (ref 0.0–0.2)
nRBC: 0 % (ref 0.0–0.2)

## 2024-02-07 LAB — HEPATIC FUNCTION PANEL
ALT: 12 U/L (ref 0–44)
AST: 26 U/L (ref 15–41)
Albumin: 2.7 g/dL — ABNORMAL LOW (ref 3.5–5.0)
Alkaline Phosphatase: 32 U/L — ABNORMAL LOW (ref 38–126)
Bilirubin, Direct: 0.1 mg/dL (ref 0.0–0.2)
Indirect Bilirubin: 0.4 mg/dL (ref 0.3–0.9)
Total Bilirubin: 0.5 mg/dL (ref 0.0–1.2)
Total Protein: 5 g/dL — ABNORMAL LOW (ref 6.5–8.1)

## 2024-02-07 LAB — GLUCOSE, CAPILLARY
Glucose-Capillary: 197 mg/dL — ABNORMAL HIGH (ref 70–99)
Glucose-Capillary: 209 mg/dL — ABNORMAL HIGH (ref 70–99)

## 2024-02-07 MED ORDER — SODIUM CHLORIDE 0.9% FLUSH
3.0000 mL | Freq: Two times a day (BID) | INTRAVENOUS | Status: DC
  Administered 2024-02-07: 3 mL via INTRAVENOUS

## 2024-02-07 MED ORDER — POLYETHYLENE GLYCOL 3350 17 G PO PACK
17.0000 g | PACK | Freq: Every day | ORAL | Status: DC
  Administered 2024-02-08 – 2024-02-11 (×3): 17 g via ORAL
  Filled 2024-02-07 (×3): qty 1

## 2024-02-07 MED ORDER — SODIUM CHLORIDE 0.9 % IV SOLN
3.0000 g | Freq: Four times a day (QID) | INTRAVENOUS | Status: DC
  Administered 2024-02-07 (×2): 3 g via INTRAVENOUS
  Filled 2024-02-07 (×2): qty 8

## 2024-02-07 MED ORDER — CHLORHEXIDINE GLUCONATE CLOTH 2 % EX PADS
6.0000 | MEDICATED_PAD | Freq: Every day | CUTANEOUS | Status: DC
  Administered 2024-02-07 – 2024-02-11 (×5): 6 via TOPICAL

## 2024-02-07 MED ORDER — POTASSIUM CHLORIDE 10 MEQ/100ML IV SOLN
INTRAVENOUS | Status: AC
  Administered 2024-02-07: 10 meq via INTRAVENOUS
  Filled 2024-02-07: qty 100

## 2024-02-07 MED ORDER — SERTRALINE HCL 50 MG PO TABS
25.0000 mg | ORAL_TABLET | Freq: Every day | ORAL | Status: DC
  Administered 2024-02-08 – 2024-02-11 (×4): 25 mg via ORAL
  Filled 2024-02-07 (×4): qty 1

## 2024-02-07 MED ORDER — ACETAMINOPHEN 325 MG PO TABS
650.0000 mg | ORAL_TABLET | Freq: Four times a day (QID) | ORAL | Status: DC | PRN
  Administered 2024-02-08 – 2024-02-10 (×2): 650 mg via ORAL
  Filled 2024-02-07 (×2): qty 2

## 2024-02-07 MED ORDER — GABAPENTIN 400 MG PO CAPS
400.0000 mg | ORAL_CAPSULE | Freq: Every day | ORAL | Status: DC
  Administered 2024-02-07 – 2024-02-10 (×5): 400 mg via ORAL
  Filled 2024-02-07 (×5): qty 1

## 2024-02-07 MED ORDER — CARVEDILOL 6.25 MG PO TABS
6.2500 mg | ORAL_TABLET | Freq: Two times a day (BID) | ORAL | Status: DC
Start: 2024-02-07 — End: 2024-02-11
  Administered 2024-02-08 – 2024-02-11 (×7): 6.25 mg via ORAL
  Filled 2024-02-07 (×7): qty 1

## 2024-02-07 MED ORDER — INSULIN ASPART 100 UNIT/ML IJ SOLN
0.0000 [IU] | Freq: Three times a day (TID) | INTRAMUSCULAR | Status: DC
  Administered 2024-02-08: 3 [IU] via SUBCUTANEOUS
  Administered 2024-02-08: 2 [IU] via SUBCUTANEOUS
  Administered 2024-02-08: 5 [IU] via SUBCUTANEOUS
  Administered 2024-02-09: 3 [IU] via SUBCUTANEOUS
  Administered 2024-02-09: 7 [IU] via SUBCUTANEOUS
  Administered 2024-02-09 – 2024-02-10 (×2): 3 [IU] via SUBCUTANEOUS
  Administered 2024-02-10: 10 [IU] via SUBCUTANEOUS
  Administered 2024-02-10: 5 [IU] via SUBCUTANEOUS
  Administered 2024-02-11: 3 [IU] via SUBCUTANEOUS
  Administered 2024-02-11: 7 [IU] via SUBCUTANEOUS

## 2024-02-07 MED ORDER — POTASSIUM CHLORIDE 10 MEQ/100ML IV SOLN: 10.0000 meq | Freq: Once | INTRAVENOUS | Status: AC

## 2024-02-07 MED ORDER — SODIUM CHLORIDE 0.9% FLUSH
10.0000 mL | Freq: Two times a day (BID) | INTRAVENOUS | Status: DC
  Administered 2024-02-07: 10 mL
  Administered 2024-02-07 – 2024-02-08 (×2): 20 mL
  Administered 2024-02-08 – 2024-02-09 (×2): 10 mL
  Administered 2024-02-09 – 2024-02-10 (×2): 20 mL
  Administered 2024-02-10 – 2024-02-11 (×2): 10 mL

## 2024-02-07 MED ORDER — ACETAMINOPHEN 650 MG RE SUPP: 650.0000 mg | Freq: Four times a day (QID) | RECTAL | Status: DC | PRN

## 2024-02-07 MED ORDER — FAMOTIDINE 20 MG PO TABS
20.0000 mg | ORAL_TABLET | Freq: Every day | ORAL | Status: DC
  Administered 2024-02-07 – 2024-02-10 (×5): 20 mg via ORAL
  Filled 2024-02-07 (×5): qty 1

## 2024-02-07 MED ORDER — ROSUVASTATIN CALCIUM 5 MG PO TABS
10.0000 mg | ORAL_TABLET | Freq: Every day | ORAL | Status: DC
  Administered 2024-02-08 – 2024-02-11 (×4): 10 mg via ORAL
  Filled 2024-02-07 (×4): qty 2

## 2024-02-07 MED ORDER — OXYCODONE HCL 5 MG PO TABS
2.5000 mg | ORAL_TABLET | Freq: Two times a day (BID) | ORAL | Status: DC | PRN
  Administered 2024-02-08 – 2024-02-11 (×6): 2.5 mg via ORAL
  Filled 2024-02-07 (×6): qty 1

## 2024-02-07 MED ORDER — ENOXAPARIN SODIUM 40 MG/0.4ML IJ SOSY
40.0000 mg | PREFILLED_SYRINGE | INTRAMUSCULAR | Status: DC
  Administered 2024-02-08 – 2024-02-10 (×3): 40 mg via SUBCUTANEOUS
  Filled 2024-02-07 (×3): qty 0.4

## 2024-02-07 MED ORDER — POLYETHYLENE GLYCOL 3350 17 G PO PACK: 17.0000 g | PACK | Freq: Every day | ORAL | Status: DC | PRN

## 2024-02-07 MED ORDER — METHENAMINE MANDELATE 0.5 G PO TABS
1.0000 g | ORAL_TABLET | Freq: Two times a day (BID) | ORAL | Status: DC
  Administered 2024-02-07 – 2024-02-11 (×8): 1 g via ORAL
  Filled 2024-02-07 (×9): qty 2

## 2024-02-07 MED ORDER — SODIUM CHLORIDE 0.9% FLUSH: 10.0000 mL | INTRAVENOUS | Status: DC | PRN

## 2024-02-07 MED ORDER — METOCLOPRAMIDE HCL 5 MG PO TABS
10.0000 mg | ORAL_TABLET | Freq: Three times a day (TID) | ORAL | Status: DC
  Administered 2024-02-07 – 2024-02-11 (×15): 10 mg via ORAL
  Filled 2024-02-07 (×15): qty 2

## 2024-02-07 MED ORDER — MIRTAZAPINE 7.5 MG PO TABS
7.5000 mg | ORAL_TABLET | Freq: Every day | ORAL | Status: DC
  Administered 2024-02-07 – 2024-02-10 (×5): 7.5 mg via ORAL
  Filled 2024-02-07 (×6): qty 1

## 2024-02-07 NOTE — ED Notes (Signed)
  Spoke to provider, and pt is to be taken off endo tool and insulin drip. Notified of last bgl 86, orders received. MD to come down to ED to see pt.

## 2024-02-07 NOTE — Progress Notes (Signed)
Peripherally Inserted Central Catheter Placement  The IV Nurse has discussed with the patient and/or persons authorized to consent for the patient, the purpose of this procedure and the potential benefits and risks involved with this procedure.  The benefits include less needle sticks, lab draws from the catheter, and the patient may be discharged home with the catheter. Risks include, but not limited to, infection, bleeding, blood clot (thrombus formation), and puncture of an artery; nerve damage and irregular heartbeat and possibility to perform a PICC exchange if needed/ordered by physician.  Alternatives to this procedure were also discussed.  Bard Power PICC patient education guide, fact sheet on infection prevention and patient information card has been provided to patient /or left at bedside.    PICC Placement Documentation  PICC Double Lumen 02/07/24 Right Brachial 36 cm 0 cm (Active)  Indication for Insertion or Continuance of Line Poor Vasculature-patient has had multiple peripheral attempts or PIVs lasting less than 24 hours 02/07/24 1435  Exposed Catheter (cm) 0 cm 02/07/24 1435  Site Assessment Clean, Dry, Intact 02/07/24 1435  Lumen #1 Status Flushed;Saline locked;Blood return noted 02/07/24 1435  Lumen #2 Status Flushed;Saline locked;Blood return noted 02/07/24 1435  Dressing Type Transparent;Securing device 02/07/24 1435  Dressing Status Antimicrobial disc/dressing in place;Clean, Dry, Intact 02/07/24 1435  Line Care Connections checked and tightened 02/07/24 1435  Line Adjustment (NICU/IV Team Only) No 02/07/24 1435  Dressing Intervention New dressing;Adhesive placed at insertion site (IV team only);Other (Comment) 02/07/24 1435  Dressing Change Due 02/14/24 02/07/24 1435    Patient's daughter, [name], signed PICC consent. Patient's R medial and proximal upper arm below the PICC line already swollen from previous PIV infiltration.    Annett Fabian 02/07/2024, 2:38 PM

## 2024-02-07 NOTE — Progress Notes (Signed)
At bedside for PIV insertion. Upon assessment, the right extremity edematous with infiltrations. Left arm with very poor vasculature. No suitable vessels identified for cannulation. RN at bedside and aware. Recommend central access at this time.

## 2024-02-07 NOTE — Assessment & Plan Note (Signed)
Trop elevated to 104 on admission and then up-trended to 128, likely in the setting of demand ischemia 2/2 hyperglycemia.  EKG demonstrated ventricular paced rhythm. - Cardiology consulted, appreciate recommendations - Could consider PPM interrogation

## 2024-02-07 NOTE — Consult Note (Addendum)
Cardiology Consultation   Patient ID: Judith [name] MRN: [id number]; DOB: Feb 13, 1941  Admit date: 02/06/2024 Date of Consult: 02/07/2024  PCP:  Darral Dash, DO   [facility] Providers Cardiologist:  Thurmon Fair, MD   {  Patient Profile:   Judith [name] is a 83 y.o. female with a hx of complete heart block status post PPM ([employer] Accolade MRI conditional device), still has old leads not MRI compatible, generator change out May 2024, type 2 diabetes, who is being seen 02/07/2024 for the evaluation of elevated troponins at the request of teaching services.  History of Present Illness:   [name] is overall stable cardiac history with PPM as above.  She is 100% V paced with 2% atrial pacing.  Estimated 8 more years on her device.  Last device check to 01/25/2024 indicating no significant arrhythmias.  Follows with Dr. Royann Shivers last seen 07/2023 without any complaints at that time.  Has preserved biventricular function with no valvular disease.  I see CABG listed in noncardiac notes but no mention of this anywhere else?  She is a resident at [facility].  Currently patient being admitted for mild DKA, hyperglycemia with glucose level 746, PNA.  Over the past 2 months she is been admitted on at least 3 different occasions for falls and other issues.  Admitted 01/25/2024 for unwitnessed fall.  TIA was considered, unable to have MRI due to incompatible leads.  She was also hypothermic during that admission.  Also with UTI.  She was discharged to rehab facility.  In speaking with the daughter it appears that she likely was not getting her insulin there, and some concerns that she may have not been on her basal insulin even before that at [facility].  Troponins 104-110-128.  Cardiology asked to see.  Patient slightly confused and very drowsy.  Fortunately daughter had come in to provide most of her history.  She used to be functional up until her most recent falls and hospital  admissions noting some ambulation with a walker and had done well but reportedly has also had significant decline in p.o. intake.  Patient is very frail.  However has not noted any chest pain, shortness of breath, palpitations.  She does have some isolated swelling in her hands and feet.  Chest x-ray possibly shows pneumonia.  Started on empiric antibiotics.  Potassium 2.8.  Creatinine 0.8.  Albumin 2.7.  Total protein 5.  Albumin 2.7.  Glucose now 391.  Hemoglobin 10.6.  Past Medical History:  Diagnosis Date   Abdominal pain 06/13/2023   Allergy 1990   Anemia    Arthritis    Biliary gastritis    Blood transfusion without reported diagnosis    2007   Cervical spine arthritis 04/05/2016   Choledocholithiasis    Controlled type 2 diabetes mellitus with diabetic neuropathy, with long-term current use of insulin (HCC)    Depression    Diabetes mellitus type 2 with complications (HCC)    Dilated bile duct    Duodenal ulcer disease    Elevated liver function tests    Encounter for long-term (current) use of insulin (HCC)    Encounter to establish care with new doctor 08/07/2022   Fall 01/11/2023   Gallstones 01/17/2018   Gastric ulcer    Gastritis and gastroduodenitis    Gastroparesis    severe   GERD (gastroesophageal reflux disease)    Gout    tested for gout but was not   Heart murmur  Hiatal hernia    Hyperlipidemia    in past not now   Hypertension    Hypokalemia, gastrointestinal losses    secondary to severe gastroparesis   Internal hemorrhoids    Long-term insulin use in type 2 diabetes (HCC) 04/05/2016   Osteoporosis    just had bone density test possible    Peripheral neuropathy    Postnasal drip 11/01/2022   Presence of cardiac pacemaker    S/P partial gastrectomy    due to severe gastric and gudoenal ulcers   Seasonal allergies    Sleep apnea    could not use CPAP   UTI (urinary tract infection)     Past Surgical History:  Procedure Laterality Date    BILIARY DILATION  02/01/2022   Procedure: BILIARY DILATION;  Surgeon: Lemar Lofty., MD;  Location: [facility] ENDOSCOPY;  Service: Gastroenterology;;   BILIARY DILATION  04/09/2022   Procedure: BILIARY DILATION;  Surgeon: Lemar Lofty., MD;  Location: [facility] ENDOSCOPY;  Service: Gastroenterology;;   BILIARY DILATION  06/13/2023   Procedure: BILIARY DILATION;  Surgeon: Lemar Lofty., MD;  Location: [facility] ENDOSCOPY;  Service: Gastroenterology;;   BILIARY STENT PLACEMENT N/A 11/30/2021   Procedure: BILIARY STENT PLACEMENT;  Surgeon: Lemar Lofty., MD;  Location: [facility] ENDOSCOPY;  Service: Gastroenterology;  Laterality: N/A;   BILIARY STENT PLACEMENT  02/01/2022   Procedure: BILIARY STENT PLACEMENT;  Surgeon: Meridee Score Netty Starring., MD;  Location: [facility] ENDOSCOPY;  Service: Gastroenterology;;   BIOPSY  11/30/2021   Procedure: BIOPSY;  Surgeon: Lemar Lofty., MD;  Location: [facility] ENDOSCOPY;  Service: Gastroenterology;;   BIOPSY  04/09/2022   Procedure: BIOPSY;  Surgeon: Lemar Lofty., MD;  Location: [facility] ENDOSCOPY;  Service: Gastroenterology;;   CHOLECYSTECTOMY N/A 12/06/2021   Procedure: LAPAROSCOPIC CHOLECYSTECTOMY, LYSIS OF ADHESIONS;  Surgeon: Harriette Bouillon, MD;  Location: [facility] ORS;  Service: General;  Laterality: N/A;   COLONOSCOPY  02/11/2018   no polyps, + small internal hemorrhoids   ENDOSCOPIC RETROGRADE CHOLANGIOPANCREATOGRAPHY (ERCP) WITH PROPOFOL N/A 11/30/2021   Procedure: ENDOSCOPIC RETROGRADE CHOLANGIOPANCREATOGRAPHY (ERCP) WITH PROPOFOL;  Surgeon: Lemar Lofty., MD;  Location: [facility] ENDOSCOPY;  Service: Gastroenterology;  Laterality: N/A;   ENDOSCOPIC RETROGRADE CHOLANGIOPANCREATOGRAPHY (ERCP) WITH PROPOFOL N/A 02/01/2022   Procedure: ENDOSCOPIC RETROGRADE CHOLANGIOPANCREATOGRAPHY (ERCP) WITH PROPOFOL;  Surgeon: Meridee Score Netty Starring., MD;  Location: [facility] ENDOSCOPY;  Service: Gastroenterology;  Laterality: N/A;   ENDOSCOPIC RETROGRADE  CHOLANGIOPANCREATOGRAPHY (ERCP) WITH PROPOFOL N/A 04/09/2022   Procedure: ENDOSCOPIC RETROGRADE CHOLANGIOPANCREATOGRAPHY (ERCP) WITH PROPOFOL;  Surgeon: Meridee Score Netty Starring., MD;  Location: [facility] ENDOSCOPY;  Service: Gastroenterology;  Laterality: N/A;   ENDOSCOPIC RETROGRADE CHOLANGIOPANCREATOGRAPHY (ERCP) WITH PROPOFOL N/A 06/13/2023   Procedure: ENDOSCOPIC RETROGRADE CHOLANGIOPANCREATOGRAPHY (ERCP) WITH PROPOFOL;  Surgeon: Meridee Score Netty Starring., MD;  Location: [facility] ENDOSCOPY;  Service: Gastroenterology;  Laterality: N/A;   ESOPHAGOGASTRODUODENOSCOPY (EGD) WITH PROPOFOL N/A 11/30/2021   Procedure: ESOPHAGOGASTRODUODENOSCOPY (EGD) WITH PROPOFOL;  Surgeon: Meridee Score Netty Starring., MD;  Location: [facility] ENDOSCOPY;  Service: Gastroenterology;  Laterality: N/A;   EUS N/A 12/12/2017   Procedure: UPPER ENDOSCOPIC ULTRASOUND (EUS) RADIAL;  Surgeon: Rachael Fee, MD;  Location: [facility] ENDOSCOPY;  Service: Endoscopy;  Laterality: N/A;   EYE SURGERY  2024   GASTRECTOMY     JOINT REPLACEMENT     pace maker     PARTIAL GASTRECTOMY     PPM GENERATOR CHANGEOUT N/A 04/25/2023   Procedure: PPM GENERATOR CHANGEOUT;  Surgeon: Thurmon Fair, MD;  Location: [facility] INVASIVE CV LAB;  Service: Cardiovascular;  Laterality: N/A;   REMOVAL OF  STONES  02/01/2022   Procedure: REMOVAL OF STONES;  Surgeon: Mansouraty, Netty Starring., MD;  Location: [facility] ENDOSCOPY;  Service: Gastroenterology;;   REMOVAL OF STONES  04/09/2022   Procedure: REMOVAL OF STONES;  Surgeon: Lemar Lofty., MD;  Location: [facility] ENDOSCOPY;  Service: Gastroenterology;;   REMOVAL OF STONES  06/13/2023   Procedure: REMOVAL OF STONES;  Surgeon: Lemar Lofty., MD;  Location: [facility] ENDOSCOPY;  Service: Gastroenterology;;   Dennison Mascot  11/30/2021   Procedure: SPHINCTEROTOMY;  Surgeon: Lemar Lofty., MD;  Location: [facility] ENDOSCOPY;  Service: Gastroenterology;;   Francine Graven REMOVAL  02/01/2022   Procedure: STENT REMOVAL;  Surgeon: Lemar Lofty., MD;  Location: [facility] ENDOSCOPY;  Service: Gastroenterology;;   Francine Graven REMOVAL  04/09/2022   Procedure: STENT REMOVAL;  Surgeon: Lemar Lofty., MD;  Location: [facility] ENDOSCOPY;  Service: Gastroenterology;;   SUBMUCOSAL TATTOO INJECTION  11/30/2021   Procedure: SUBMUCOSAL TATTOO INJECTION;  Surgeon: Lemar Lofty., MD;  Location: [facility] ENDOSCOPY;  Service: Gastroenterology;;   TONSILLECTOMY     45 years ago   UPPER GASTROINTESTINAL ENDOSCOPY       Inpatient Medications: Scheduled Meds:  carvedilol  6.25 mg Oral BID WC   Chlorhexidine Gluconate Cloth  6 each Topical Daily   [START ON 02/08/2024] enoxaparin (LOVENOX) injection  40 mg Subcutaneous Q24H   famotidine  20 mg Oral QHS   gabapentin  400 mg Oral QHS   methenamine  1 g Oral BID   metoCLOPramide  10 mg Oral TID AC & HS   mirtazapine  7.5 mg Oral QHS   polyethylene glycol  17 g Oral Daily   rosuvastatin  10 mg Oral Daily   sertraline  25 mg Oral Daily   sodium chloride flush  10-40 mL Intracatheter Q12H   Continuous Infusions:  lactated ringers Stopped (02/07/24 0807)   PRN Meds: acetaminophen **OR** acetaminophen, dextrose, oxyCODONE, sodium chloride flush  Allergies:    Allergies  Allergen Reactions   Asa [Aspirin] Other (See Comments)    Causes ulcers    Motrin [Ibuprofen] Other (See Comments)    GI intolerance   Cipro [Ciprofloxacin Hcl] Rash    Ok to use eye drop formulation   Sulfa Antibiotics Other (See Comments)    GI issues    Social History:   Social History   Socioeconomic History   Marital status: Widowed    Spouse name: Not on file   Number of children: 3   Years of education: Not on file   Highest education level: GED or equivalent  Occupational History   Not on file  Tobacco Use   Smoking status: Never   Smokeless tobacco: Never  Vaping Use   Vaping status: Never Used  Substance and Sexual Activity   Alcohol use: No   Drug use: No   Sexual activity: Not Currently   Other Topics Concern   Not on file  Social History Narrative   Used to live independently in [place]   However had significant issues with severe gastroparesis resulting in severe hypokalemia and general weakness and fall resulting in hospitalization and SNF rehab   Now (10/2017) living with her daughter in [place] until able to stabilize and regain independence.      May 2019   Patient living in assisted living, [place]   Social Drivers of Health   Financial Resource Strain: Low Risk  (10/19/2023)   Overall Financial Resource Strain (CARDIA)    Difficulty of Paying Living Expenses: Not very hard  Food  Insecurity: No Food Insecurity (02/07/2024)   Hunger Vital Sign    Worried About Running Out of Food in the Last Year: Never true    Ran Out of Food in the Last Year: Never true  Transportation Needs: No Transportation Needs (02/07/2024)   PRAPARE - Administrator, Civil Service (Medical): No    Lack of Transportation (Non-Medical): No  Physical Activity: Unknown (10/19/2023)   Exercise Vital Sign    Days of Exercise per Week: Patient declined    Minutes of Exercise per Session: 0 min  Recent Concern: Physical Activity - Inactive (09/27/2023)   Exercise Vital Sign    Days of Exercise per Week: 0 days    Minutes of Exercise per Session: 0 min  Stress: Patient Declined (10/19/2023)   [employer] of Occupational Health - Occupational Stress Questionnaire    Feeling of Stress : Patient declined  Social Connections: Socially Isolated (02/07/2024)   Social Connection and Isolation Panel [NHANES]    Frequency of Communication with Friends and Family: Twice a week    Frequency of Social Gatherings with Friends and Family: Once a week    Attends Religious Services: Never    Database administrator or Organizations: No    Attends Banker Meetings: Never    Marital Status: Widowed  Intimate Partner Violence: Not At Risk (02/07/2024)   Humiliation, Afraid, Rape,  and Kick questionnaire    Fear of Current or Ex-Partner: No    Emotionally Abused: No    Physically Abused: No    Sexually Abused: No    Family History:   Family History  Problem Relation Age of Onset   Diabetes Mother    Diabetes Father    Heart disease Father    Diabetes Sister    Heart disease Son    Colon cancer Neg Hx    Colon polyps Neg Hx    Rectal cancer Neg Hx    Stomach cancer Neg Hx    Esophageal cancer Neg Hx      ROS:  Please see the history of present illness.  All other ROS reviewed and negative.     Physical Exam/Data:   Vitals:   02/07/24 1415 02/07/24 1430 02/07/24 1455 02/07/24 1515  BP: (!) 155/104 117/75  118/68  Pulse: 86 71  78  Resp: (!) 26 17  17   Temp:   99.2 F (37.3 C)   TempSrc:   Rectal   SpO2: 97% (!) 84%  100%  Weight:      Height:        Intake/Output Summary (Last 24 hours) at 02/07/2024 1542 Last data filed at 02/07/2024 0602 Gross per 24 hour  Intake 2099.17 ml  Output --  Net 2099.17 ml      02/06/2024    8:28 PM 01/25/2024    4:00 PM 01/25/2024    1:00 AM  Last 3 Weights  Weight (lbs) 102 lb 102 lb 8.2 oz 104 lb 11.5 oz  Weight (kg) 46.267 kg 46.5 kg 47.5 kg     Body mass index is 18.07 kg/m.  General: Frail and somnolent. HEENT: normal Neck: no JVD Vascular: No carotid bruits; Distal pulses 2+ bilaterally Cardiac:  normal S1, S2; RRR; no murmur  Lungs:  clear to auscultation bilaterally, no wheezing, rhonchi or rales  Abd: soft, nontender, no hepatomegaly  Ext: no edema Musculoskeletal:  No deformities, BUE and BLE strength normal and equal Skin: warm and dry  Neuro:  CNs 2-12  intact, no focal abnormalities noted Psych:  Normal affect   EKG:  The EKG was personally reviewed and demonstrates: Sinus rhythm heart rate 83.  V-paced.  Prolonged PR interval.  Unable to calculate.  LBBB.  Negative Sgarbossa's criteria. Telemetry:  Telemetry was personally reviewed and demonstrates: V paced heart rates in the  80s  Relevant CV Studies: Echocardiogram 01/25/2024 1. Left ventricular ejection fraction, by estimation, is 50 to 55%. The  left ventricle has low normal function. Left ventricular endocardial  border not optimally defined to evaluate regional wall motion. Left  ventricular diastolic parameters are  consistent with Grade I diastolic dysfunction (impaired relaxation).  Elevated left ventricular end-diastolic pressure.   2. Right ventricular systolic function is normal. The right ventricular  size is normal.   3. The mitral valve is abnormal. Mild mitral valve regurgitation. No  evidence of mitral stenosis. Severe mitral annular calcification.   4. The aortic valve is tricuspid. Aortic valve regurgitation is mild.   Comparison(s): No prior Echocardiogram.    Laboratory Data:  High Sensitivity Troponin:   Recent Labs  Lab 01/25/24 0140 01/25/24 0335 02/07/24 0043 02/07/24 0411 02/07/24 0552  TROPONINIHS 20* 20* 104* 110* 128*     Chemistry Recent Labs  Lab 02/06/24 2208 02/06/24 2209 02/07/24 0043 02/07/24 0411  NA 129* 129*  130* 131* 134*  K 4.6 4.6  4.6 4.1 3.8  CL 92* 92* 94* 99  CO2 25  --  24 23  GLUCOSE 746* >700* 651* 391*  BUN 12 12 11 10   CREATININE 0.93 0.80 0.83 0.80  CALCIUM 9.0  --  8.8* 8.7*  GFRNONAA >60  --  >60 >60  ANIONGAP 12  --  13 12    Recent Labs  Lab 02/07/24 0411  PROT 5.0*  ALBUMIN 2.7*  AST 26  ALT 12  ALKPHOS 32*  BILITOT 0.5   Lipids No results for input(s): "CHOL", "TRIG", "HDL", "LABVLDL", "LDLCALC", "CHOLHDL" in the last 168 hours.  Hematology Recent Labs  Lab 02/06/24 2039 02/06/24 2209 02/07/24 0043  WBC 14.6*  --  11.3*  RBC 3.54*  --  3.17*  HGB 11.9* 12.6  12.6 10.6*  HCT 36.5 37.0  37.0 31.8*  MCV 103.1*  --  100.3*  MCH 33.6  --  33.4  MCHC 32.6  --  33.3  RDW 14.5  --  14.3  PLT 188  --  198   Thyroid No results for input(s): "TSH", "FREET4" in the last 168 hours.  BNPNo results for input(s): "BNP",  "PROBNP" in the last 168 hours.  DDimer No results for input(s): "DDIMER" in the last 168 hours.   Radiology/Studies:  [place] EKG SITE RITE Result Date: 02/07/2024 If Site Rite image not attached, placement could not be confirmed due to current cardiac rhythm.  DG CHEST PORT 1 VIEW Result Date: 02/07/2024 CLINICAL DATA:  Community acquired pneumonia EXAM: PORTABLE CHEST 1 VIEW COMPARISON:  01/25/2024 FINDINGS: Left pacer remains in place, unchanged. Heart and mediastinal contours are within normal limits. Aortic atherosclerosis. Bibasilar densities are noted. No effusions or acute bony abnormality. IMPRESSION: Bibasilar densities, favor atelectasis although pneumonia cannot be completely excluded. Electronically Signed   By: Charlett Nose M.D.   On: 02/07/2024 00:19     Assessment and Plan:   Elevated troponins Patient here noted to be mild DKA, BGL 746.  Troponins were checked 104-110-128.  EKG with no acute ST-T wave changes, chronic unchanged LBBB V paced with negative Sgarbossa's criteria.  Asymptomatic with  no anginal complaints or equivalents.  Troponin trend is minimal and in the absence of any symptoms and with the above illnesses likely represent demand ischemia.  Additionally patient is extremely frail with frequent falls this year not likely to be a good DAPT candidate regardless.  Would not treat as ACS or with any further workup.   Continue Crestor 10 mg.  Beta-blocker being held due to hypotension.  CHB status post [employer] PPM Has old leads not MRI compatible Normal device function 01/25/24.  100% V-paced.  Has at least 8 years left on her device.  I do not feel this needs to be reinterrogated today.  Hyperglycemia DM Daughter reports not being given her insulin at rehab.  Very somnolent here with recent history of UTI.  Surprisingly A1c only 6.2%.  Further workup per primary team     Risk Assessment/Risk Scores:    For questions or updates, please contact [facility] Please consult [email or web address] for contact info under    Signed, Abagail Kitchens, PA-C  02/07/2024 3:42 PM   I have personally seen and examined this patient. I agree with the assessment and plan as outlined above.  83 yo female with history of complete heart block with PPM in place, DM, recent admission with UTI who is now admitted with weakness and found to have DKA, possible pneumonia. Troponin with mild elevation and flat.  She c/o feeling week. Her daughter provides her history. The patient is somnolent but does answer questions.  Labs reviewed by me EKG reviewed by me and shows paced rhythm Exam: Frail female. Chronically ill appearing. CV:RRR Lungs:  Clear bilaterally Ext: No LE edema Plan: Troponin elevation in the setting of DKA and possible pneumonia. This is likely demand ischemia. I do not think this represents ACS. Would not start IV heparin.   Verne Carrow, MD, [facility] 02/07/2024 4:29 PM

## 2024-02-07 NOTE — Plan of Care (Signed)
Alerted by nursing at approximately 02 20 that patient had a troponin level of 104 drawn at 00 43.  Acutely went to bedside to evaluate the patient, she was resting comfortably but did endorse sternal chest pain.  Initially she reported that it was reproducible with palpation but after waking up more and asked a second time she reported that her chest pain was not reproducible with palpation.  Given that patient is endorsing substernal chest pain, and is a poor historian due to baseline confusion, we have ordered a stat EKG as well as repeat troponins.  EKG done at 205 appears stable when compared to February 1 in January 21 EKGs.  Will continue to monitor and intervene as appropriate based on the results.  Gerrit Heck, DO PGY1, Family Medicine

## 2024-02-07 NOTE — ED Notes (Signed)
Provider made aware of multiple unsuccessful attempts at drawing timed troponin.  Awaiting orders at this time.

## 2024-02-07 NOTE — ED Notes (Signed)
IV team at bedside placing PICC

## 2024-02-07 NOTE — Plan of Care (Signed)
Evaluated patient at bedside after being contacted by nursing for concern over bilateral hand swelling.  Patient had strong pulses on exam, and bilateral hands were warm and well-perfused.  Minimal noted by this provider bilateral upper extremity.  Bilateral lower extremity was without edema.  Lungs were clear in all fields, no evidence of crackles or other findings suggestive of pulmonary edema.  Patient's oxygen saturation was 100%.  Will continue to monitor but at this time exam is reassuring.  Gerrit Heck, DO PGY1, Family Medicine

## 2024-02-07 NOTE — ED Notes (Signed)
Spoke with MD about holding oral medications for now since pt is alert only to verbal stimuli and has a hard time staying awake. Team is discussing making pt NPO

## 2024-02-07 NOTE — Hospital Course (Addendum)
 Judith [name] is a 83 y.o. female with past medical hx of T2DM, multilevel DDD, peripheral neuropathy, depression, unintentional weight loss, and complete heart block s/p pacemaker who was admitted to the [facility] Medicine Teaching Service at [facility] for hyperglycemia and possible HHS. Hospital course is outlined below by problem.   Hyperglycemia Patient presented to the emergency department from her facility with a BG of 746.  Patient was recently admitted to the service for a fall and discharged to facility without home insulin.  Endotool was utilized and she received 70.5 units Toradol with rapid response to normal CBGs within 4 hours.  Sugars remained stable after discontinuing Endotool, started sensitive SSI.  Elevated Troponins  Demand Ischemia Elevated to 104 on admission and then up trended, likely in the setting of demand ischemia secondary to hyperglycemia.  EKG demonstrated ventricularly paced rhythm.  Cardiology was consulted and recommended continuing Crestor otherwise had no further medical intervention at this time.  Other chronic conditions were medically managed with home medications and formulary alternatives as necessary (HTN, HLD, peripheral neuropathy, depression)  PCP Follow-up Recommendations: Need for insulin regimen depending on CBGs.

## 2024-02-07 NOTE — ED Notes (Addendum)
CBG is 547.

## 2024-02-07 NOTE — Plan of Care (Signed)
Was messaged by Dr. Maryjean Ka that pt was admitted to Hospitalist service. Pt is admitted for hyperglycemia with mild DKA, started on insulin drip. Pending CXR and trops. Family Medicine Teaching Service planning to assume care at 0700 2/14 w/ attending Dr. Linwood Dibbles.

## 2024-02-07 NOTE — ED Notes (Signed)
Pt placed on 2 LNC due to desaturations during proceedure

## 2024-02-07 NOTE — Plan of Care (Signed)
FMTS Interim Progress Note  Called daughter to discuss code status. Daughter clarifies that currently, she would still like for the patient to receive CPR and intubation in an emergent/temporary situation but may not be interested in long term ventilator support.  Daughter reports she was unaware of the full extent of what full code meant and recognizes that CPR and intubation even in an emergency may not improve the patient's quality of life.  Daughter reports that in the past she has had to make her son DNR.  Daughter reports that she will discuss with rest of family and may give a call back to revisit code status. I offered palliative consult but she would like to discuss with family first.   Code status remains FULL CODE  Vonna Drafts, MD 02/07/2024, 5:35 PM PGY-2, [facility] Family Medicine Service pager [phone]

## 2024-02-07 NOTE — Assessment & Plan Note (Addendum)
Please review HPI, my suspicion is that the patient probably received no insulin last 3 days till her glucose was checked today.  Patient seems to have marked hyperglycemia with presenting blood glucose of 746 with mild diabetic ketoacidosis.  Patient started on insulin infusion which will be continued.  Till patient glucose under 250.  At which time diet will be ordered for the patient. Monitor BMP Q4 hr  Given marked leukocytosis, and patient's inability to tell us about her symptoms.  I will check a chest x-ray as well as blood cultures as well as follow-up the urinalysis that is pending.  Patient will be started empirically on Unasyn.  Check troponin

## 2024-02-07 NOTE — Assessment & Plan Note (Addendum)
Previously on 18 units daily with sliding scale, last A1c 6.2, 6 days ago. Apparently this was discontinued per chart review, unsure if she was still receiving this at facility prior to last admission.  During her last admission she had episodes of hypoglycemia and decreased appetite, sugars were checked but then CBGs and SSI were discontinued and she was not discharged with any medications.  Unclear cause of glucose fluctuation. - Will monitor at this time, but assess further if indicated - Pharmacy to check with the facility if she was previously being given insulin prior to recent admission

## 2024-02-07 NOTE — ED Notes (Signed)
Updated [name] daughter and POA on patient status and plan of care. Daughter informed this Clinical research associate "Patient discharged recently but not prescribed any short or long acting insulin ordered for the patient to take at home which she has been taking for over 20 years. Facility said they had not gotten any orders on the discharge paperwork or in their new orders for the facility, leaving her without sugar checks or her insulin for multiple days. I would like to make sure this gets addressed before she gets discharged again."

## 2024-02-07 NOTE — Plan of Care (Signed)

## 2024-02-07 NOTE — Inpatient Diabetes Management (Signed)
Inpatient Diabetes Program Recommendations  AACE/ADA: New Consensus Statement on Inpatient Glycemic Control (2015)  Target Ranges:  Prepandial:   less than 140 mg/dL      Peak postprandial:   less than 180 mg/dL (1-2 hours)      Critically ill patients:  140 - 180 mg/dL   Lab Results  Component Value Date   GLUCAP 154 (H) 02/07/2024   HGBA1C 6.2 (H) 02/01/2024    Review of Glycemic Control  Latest Reference Range & Units 02/07/24 07:26 02/07/24 08:36 02/07/24 10:59  Glucose-Capillary 70 - 99 mg/dL 409 (H) 86 811 (H)   Diabetes history: DM  Outpatient Diabetes medications: None (pharmacy is checking with facility to see if patient was on any insulin prior to admit) Current orders for Inpatient glycemic control:  None  Inpatient Diabetes Program Recommendations:    CBG>600 on admit and IV insulin started.  CBG down to 86 mg/dL and IV insulin stopped.  May consider adding Novolog 0-6 units q 4 hours (coverage starts at 151 mg/dL).  Also consider Semglee 5 units daily.   Thanks  Lorenza Cambridge, RN, BC-ADM Inpatient Diabetes Coordinator Pager [phone]  (8a-5p)

## 2024-02-07 NOTE — ED Notes (Signed)
0500  

## 2024-02-07 NOTE — Progress Notes (Addendum)
Daily Progress Note Intern Pager: [phone]  Patient name: Judith [name] Medical record number: [id number] Date of birth: 21-May-1941 Age: 83 y.o. Gender: female  Primary Care Provider: Darral Dash, DO Consultants: None Code Status: Full Code  Pt Overview and Major Events to Date:  2/13: Admitted  Assessment and Plan: Judith [name] is a 83 y.o. female with past medical history T2DM, multilevel DDD, peripheral neuropathy, depression, unintentional weight loss, and CAD s/p CABG and pacemaker, complete heart block s/p pacemaker presenting with hyperglycemia to 746 in the ED.    Patient also has elevated troponins on admission possibly due to demand ischemia.  EKG demonstrated ventricular paced rhythm.  Consulted cardiology for recommendations, appreciate assistance.  Was also started on Unasyn due to concern for PNA, however patient has remained afebrile and has no focal findings on exam and CXR looks similar to previous studies, will discontinue Unasyn.  Assessment & Plan Hyperglycemia Likely HHS with sugars of 746 upon admission.  Endotool was initiated.  Sugars have been on the downtrend.  This AM patient was hypoglycemic to 86.  Discontinued Endotool, advised D50 push, discontinued D5 LR and modified LR to 50 mL/hr due to concern for third spacing.  More drowsy and confused at baseline but still responsive to name and simple commands. - NPO - Off Endotool, will monitor sugars s/p D50 and LR 50 ml/h  - Labs: BMP and CBC 5 PM and 5 AM - IV team consulted to reassess access - Swallow eval and diet after improvement of mentation  - Holding home PO medications at this time Long-term insulin use in type 2 diabetes (HCC) Previously on 18 units daily with sliding scale, last A1c 6.2, 6 days ago. Apparently this was discontinued per chart review, unsure if she was still receiving this at facility prior to last admission.  During her last admission she had episodes of hypoglycemia and  decreased appetite, sugars were checked but then CBGs and SSI were discontinued and she was not discharged with any medications.  Unclear cause of glucose fluctuation. - Will monitor at this time, but assess further if indicated - Pharmacy to check with the facility if she was previously being given insulin prior to recent admission Elevated troponin Trop elevated to 104 on admission and then up-trended to 128, likely in the setting of demand ischemia 2/2 hyperglycemia.  EKG demonstrated ventricular paced rhythm. - Cardiology consulted, appreciate recommendations - Could consider PPM interrogation  Chronic and Stable Problems:  HTN: Coreg 6.25 BID at home. Holding in setting of drowsiness and hypotensive HLD: Crestor 10 mg daily, holding in setting of NPO DDD: Chronic issue. No sign of fracture or listhesis in CT neck. Peripheral neuropathy: On Gabapentin 400 mg at bedtime Depression: On Cymbalta 60 mg daily  Complete Heart Block with pacemaker: No hx of echo. Consider PPM interrogation.  Pulmonary nodules: Incidental 3 mm left upper pulmonary nodule found 05/2023 will monitor with low dose CT in 6-12 months, will put in follow up instructions in discharge summary  Sleep Apnea: Not on CPAP Gastroparesis: Hx of partial gastrectomy   FEN/GI: NPO PPx: Lovenox Dispo: Pending stabilization of sugars and clinical improvement  Subjective:  Patient is responsive to pain and voice today. She is able to state her name. She was not sure of where she is or why at this time. She is able to follow some commands, but seems pretty drowsy and confused this AM.  Objective: Temp:  [98.1 F (36.7 C)-98.6 F (37 C)]  98.5 F (36.9 C) (02/14 1004) Pulse Rate:  [74-89] 84 (02/14 1000) Resp:  [14-18] 14 (02/14 1000) BP: (92-139)/(56-92) 102/76 (02/14 1000) SpO2:  [99 %-100 %] 100 % (02/14 1000) Weight:  [46.3 kg] 46.3 kg (02/13 2028) Physical Exam: General: Drowsy, ill-appearing female in  NAD. Cardiovascular: RRR. No M/R/G. Respiratory: CTAB. Normal WOB on RA.  Abdomen: Soft, nontender, nondistended. Normoactive bowel sounds.  Extremities: 1+ BLE edema. Neuro: Oriented to name, but not place or location.   Laboratory: Most recent CBC Lab Results  Component Value Date   WBC 11.3 (H) 02/07/2024   HGB 10.6 (L) 02/07/2024   HCT 31.8 (L) 02/07/2024   MCV 100.3 (H) 02/07/2024   PLT 198 02/07/2024   Most recent BMP    Latest Ref Rng & Units 02/07/2024    4:11 AM  BMP  Glucose 70 - 99 mg/dL 409   BUN 8 - 23 mg/dL 10   Creatinine 8.11 - 1.00 mg/dL 9.14   Sodium 782 - 956 mmol/L 134   Potassium 3.5 - 5.1 mmol/L 3.8   Chloride 98 - 111 mmol/L 99   CO2 22 - 32 mmol/L 23   Calcium 8.9 - 10.3 mg/dL 8.7    Trop: 213>086>578 HFP: Albumin 2.7, Alk phos 32  Imaging/Diagnostic Tests: CXR:  Bibasilar densities, favor atelectasis although pneumonia cannot be completely excluded.  Fortunato Curling, DO 02/07/2024, 11:50 AM  PGY-1, [facility] FPTS Intern pager: [phone], text pages welcome Secure chat group [facility] Teaching Service

## 2024-02-07 NOTE — ED Notes (Signed)
Pt had loose bm and saturated brief. Pt bed linen and brief changed. Pericare performed, pt placed in hospital gown.

## 2024-02-07 NOTE — ED Notes (Signed)
This RN obtained verbal consent from [name] (pt's daughter) about PICC placement for critical lab draws and medication administration.

## 2024-02-07 NOTE — Assessment & Plan Note (Addendum)
Likely HHS with sugars of 746 upon admission.  Endotool was initiated.  Sugars have been on the downtrend.  This AM patient was hypoglycemic to 86.  Discontinued Endotool, advised D50 push, discontinued D5 LR and modified LR to 50 mL/hr due to concern for third spacing.  More drowsy and confused at baseline but still responsive to name and simple commands. - NPO - Off Endotool, will monitor sugars s/p D50 and LR 50 ml/h  - Labs: BMP and CBC 5 PM and 5 AM - IV team consulted to reassess access - Swallow eval and diet after improvement of mentation  - Holding home PO medications at this time

## 2024-02-07 NOTE — Plan of Care (Signed)
Curbsided Cardiology fellow Geraldo Pitter) to review pt EKGs. He reviewed and agrees that EKG is consistent with prior, no new ST changes. Will f/u repeat EKG and trops.

## 2024-02-07 NOTE — Plan of Care (Signed)
FMTS Brief Progress Note  S: Patient was awake, resting comfortably on exam this evening. She denies any ongoing pain or discomfort. Her only complaint is that she is thirsty   O: BP (!) 144/62 (BP Location: Left Arm)   Pulse 61   Temp 97.9 F (36.6 C) (Oral)   Resp 13   Ht 5\' 3"  (1.6 m)   Wt 47.7 kg   SpO2 95%   BMI 18.63 kg/m   General: Elderly appearing female, no distress Cardiac: RRR, no m/r/g Respiratory: CTAB, no increased WOB  A/P: Given that patient is more awake this evening, we will proceed with a bedside swallow and allow her to drink at this time. Will likely still need SLP evaluation before progressing to full diet.  - Orders reviewed. Labs for AM ordered, which was adjusted as needed.  - If condition changes, plan includes rapid reassessment and intervention based on patient condition.   Gerrit Heck, DO 02/07/2024, 8:22 PM PGY-1, [facility] Night Resident  Please page [phone] with questions.

## 2024-02-07 NOTE — Plan of Care (Signed)
Discussed transitioning off of Endotool with nursing.  However, while on the phone we were notified that her most recent BG was 86, she is fluid overloaded, drowsy, and confused, but this isn't an acute change from overnight.    Advised nursing to discontinue Endotool, give D50 push, discontinue D5 LR and modified LR to 50 mL/h.  Will go see patient to assess.  Fortunato Curling, DO [facility], PGY-1 02/07/24 8:49 AM  Service pager [phone]

## 2024-02-08 DIAGNOSIS — R739 Hyperglycemia, unspecified: Secondary | ICD-10-CM | POA: Diagnosis not present

## 2024-02-08 LAB — BASIC METABOLIC PANEL
Anion gap: 7 (ref 5–15)
BUN: 5 mg/dL — ABNORMAL LOW (ref 8–23)
CO2: 27 mmol/L (ref 22–32)
Calcium: 8.5 mg/dL — ABNORMAL LOW (ref 8.9–10.3)
Chloride: 101 mmol/L (ref 98–111)
Creatinine, Ser: 0.72 mg/dL (ref 0.44–1.00)
GFR, Estimated: >60 mL/min (ref >60)
Glucose, Bld: 264 mg/dL — ABNORMAL HIGH (ref 70–99)
Potassium: 3.6 mmol/L (ref 3.5–5.1)
Sodium: 135 mmol/L (ref 135–145)

## 2024-02-08 LAB — GLUCOSE, CAPILLARY
Glucose-Capillary: 256 mg/dL — ABNORMAL HIGH (ref 70–99)
Glucose-Capillary: 195 mg/dL — ABNORMAL HIGH (ref 70–99)
Glucose-Capillary: 236 mg/dL — ABNORMAL HIGH (ref 70–99)
Glucose-Capillary: 245 mg/dL — ABNORMAL HIGH (ref 70–99)

## 2024-02-08 LAB — URINALYSIS, ROUTINE W REFLEX MICROSCOPIC
Bilirubin Urine: NEGATIVE
Glucose, UA: >=500 mg/dL — AB
Hgb urine dipstick: NEGATIVE
Ketones, ur: NEGATIVE mg/dL
Nitrite: NEGATIVE
Protein, ur: NEGATIVE mg/dL
Specific Gravity, Urine: 1.007 (ref 1.005–1.030)
pH: 6 (ref 5.0–8.0)

## 2024-02-08 LAB — CBC
HCT: 32.7 % — ABNORMAL LOW (ref 36.0–46.0)
Hemoglobin: 10.7 g/dL — ABNORMAL LOW (ref 12.0–15.0)
MCH: 32.8 pg (ref 26.0–34.0)
MCHC: 32.7 g/dL (ref 30.0–36.0)
MCV: 100.3 fL — ABNORMAL HIGH (ref 80.0–100.0)
Platelets: 194 10*3/uL (ref 150–400)
RBC: 3.26 MIL/uL — ABNORMAL LOW (ref 3.87–5.11)
RDW: 14.8 % (ref 11.5–15.5)
WBC: 12.4 10*3/uL — ABNORMAL HIGH (ref 4.0–10.5)
nRBC: 0 % (ref 0.0–0.2)

## 2024-02-08 MED ORDER — LIDOCAINE 5 % EX PTCH
2.0000 | MEDICATED_PATCH | CUTANEOUS | Status: DC
  Administered 2024-02-08 – 2024-02-11 (×4): 2 via TRANSDERMAL
  Filled 2024-02-08 (×4): qty 2

## 2024-02-08 NOTE — Plan of Care (Signed)

## 2024-02-08 NOTE — Evaluation (Signed)
 Clinical/Bedside Swallow Evaluation Patient Details  Name: Judith [name] MRN: [id number] Date of Birth: Jan 10, 1941  Today's Date: 02/08/2024 Time: SLP Start Time (ACUTE ONLY): 1230 SLP Stop Time (ACUTE ONLY): 1240 SLP Time Calculation (min) (ACUTE ONLY): 10 min  Past Medical History:  Past Medical History:  Diagnosis Date   Abdominal pain 06/13/2023   Allergy 1990   Anemia    Arthritis    Biliary gastritis    Blood transfusion without reported diagnosis    2007   Cervical spine arthritis 04/05/2016   Choledocholithiasis    Controlled type 2 diabetes mellitus with diabetic neuropathy, with long-term current use of insulin (HCC)    Depression    Diabetes mellitus type 2 with complications (HCC)    Dilated bile duct    Duodenal ulcer disease    Elevated liver function tests    Encounter for long-term (current) use of insulin (HCC)    Encounter to establish care with new doctor 08/07/2022   Fall 01/11/2023   Gallstones 01/17/2018   Gastric ulcer    Gastritis and gastroduodenitis    Gastroparesis    severe   GERD (gastroesophageal reflux disease)    Gout    tested for gout but was not   Heart murmur    Hiatal hernia    Hyperlipidemia    in past not now   Hypertension    Hypokalemia, gastrointestinal losses    secondary to severe gastroparesis   Internal hemorrhoids    Long-term insulin use in type 2 diabetes (HCC) 04/05/2016   Osteoporosis    just had bone density test possible    Peripheral neuropathy    Postnasal drip 11/01/2022   Presence of cardiac pacemaker    S/P partial gastrectomy    due to severe gastric and gudoenal ulcers   Seasonal allergies    Sleep apnea    could not use CPAP   UTI (urinary tract infection)    Past Surgical History:  Past Surgical History:  Procedure Laterality Date   BILIARY DILATION  02/01/2022   Procedure: BILIARY DILATION;  Surgeon: Lemar Lofty., MD;  Location: [facility] ENDOSCOPY;  Service: Gastroenterology;;   BILIARY  DILATION  04/09/2022   Procedure: BILIARY DILATION;  Surgeon: Lemar Lofty., MD;  Location: [facility] ENDOSCOPY;  Service: Gastroenterology;;   BILIARY DILATION  06/13/2023   Procedure: BILIARY DILATION;  Surgeon: Lemar Lofty., MD;  Location: [facility] ENDOSCOPY;  Service: Gastroenterology;;   BILIARY STENT PLACEMENT N/A 11/30/2021   Procedure: BILIARY STENT PLACEMENT;  Surgeon: Lemar Lofty., MD;  Location: [facility] ENDOSCOPY;  Service: Gastroenterology;  Laterality: N/A;   BILIARY STENT PLACEMENT  02/01/2022   Procedure: BILIARY STENT PLACEMENT;  Surgeon: Meridee Score Netty Starring., MD;  Location: [facility] ENDOSCOPY;  Service: Gastroenterology;;   BIOPSY  11/30/2021   Procedure: BIOPSY;  Surgeon: Lemar Lofty., MD;  Location: [facility] ENDOSCOPY;  Service: Gastroenterology;;   BIOPSY  04/09/2022   Procedure: BIOPSY;  Surgeon: Lemar Lofty., MD;  Location: [facility] ENDOSCOPY;  Service: Gastroenterology;;   CHOLECYSTECTOMY N/A 12/06/2021   Procedure: LAPAROSCOPIC CHOLECYSTECTOMY, LYSIS OF ADHESIONS;  Surgeon: Harriette Bouillon, MD;  Location: [facility] ORS;  Service: General;  Laterality: N/A;   COLONOSCOPY  02/11/2018   no polyps, + small internal hemorrhoids   ENDOSCOPIC RETROGRADE CHOLANGIOPANCREATOGRAPHY (ERCP) WITH PROPOFOL N/A 11/30/2021   Procedure: ENDOSCOPIC RETROGRADE CHOLANGIOPANCREATOGRAPHY (ERCP) WITH PROPOFOL;  Surgeon: Lemar Lofty., MD;  Location: [facility] ENDOSCOPY;  Service: Gastroenterology;  Laterality: N/A;   ENDOSCOPIC RETROGRADE CHOLANGIOPANCREATOGRAPHY (ERCP) WITH  PROPOFOL N/A 02/01/2022   Procedure: ENDOSCOPIC RETROGRADE CHOLANGIOPANCREATOGRAPHY (ERCP) WITH PROPOFOL;  Surgeon: Meridee Score Netty Starring., MD;  Location: [facility] ENDOSCOPY;  Service: Gastroenterology;  Laterality: N/A;   ENDOSCOPIC RETROGRADE CHOLANGIOPANCREATOGRAPHY (ERCP) WITH PROPOFOL N/A 04/09/2022   Procedure: ENDOSCOPIC RETROGRADE CHOLANGIOPANCREATOGRAPHY (ERCP) WITH PROPOFOL;  Surgeon: Meridee Score  Netty Starring., MD;  Location: [facility] ENDOSCOPY;  Service: Gastroenterology;  Laterality: N/A;   ENDOSCOPIC RETROGRADE CHOLANGIOPANCREATOGRAPHY (ERCP) WITH PROPOFOL N/A 06/13/2023   Procedure: ENDOSCOPIC RETROGRADE CHOLANGIOPANCREATOGRAPHY (ERCP) WITH PROPOFOL;  Surgeon: Meridee Score Netty Starring., MD;  Location: [facility] ENDOSCOPY;  Service: Gastroenterology;  Laterality: N/A;   ESOPHAGOGASTRODUODENOSCOPY (EGD) WITH PROPOFOL N/A 11/30/2021   Procedure: ESOPHAGOGASTRODUODENOSCOPY (EGD) WITH PROPOFOL;  Surgeon: Meridee Score Netty Starring., MD;  Location: [facility] ENDOSCOPY;  Service: Gastroenterology;  Laterality: N/A;   EUS N/A 12/12/2017   Procedure: UPPER ENDOSCOPIC ULTRASOUND (EUS) RADIAL;  Surgeon: Rachael Fee, MD;  Location: [facility] ENDOSCOPY;  Service: Endoscopy;  Laterality: N/A;   EYE SURGERY  2024   GASTRECTOMY     JOINT REPLACEMENT     pace maker     PARTIAL GASTRECTOMY     PPM GENERATOR CHANGEOUT N/A 04/25/2023   Procedure: PPM GENERATOR CHANGEOUT;  Surgeon: Thurmon Fair, MD;  Location: [facility] INVASIVE CV LAB;  Service: Cardiovascular;  Laterality: N/A;   REMOVAL OF STONES  02/01/2022   Procedure: REMOVAL OF STONES;  Surgeon: Meridee Score Netty Starring., MD;  Location: [facility] ENDOSCOPY;  Service: Gastroenterology;;   REMOVAL OF STONES  04/09/2022   Procedure: REMOVAL OF STONES;  Surgeon: Lemar Lofty., MD;  Location: [facility] ENDOSCOPY;  Service: Gastroenterology;;   REMOVAL OF STONES  06/13/2023   Procedure: REMOVAL OF STONES;  Surgeon: Lemar Lofty., MD;  Location: [facility] ENDOSCOPY;  Service: Gastroenterology;;   Dennison Mascot  11/30/2021   Procedure: Dennison Mascot;  Surgeon: Lemar Lofty., MD;  Location: [facility] ENDOSCOPY;  Service: Gastroenterology;;   Francine Graven REMOVAL  02/01/2022   Procedure: STENT REMOVAL;  Surgeon: Lemar Lofty., MD;  Location: [facility] ENDOSCOPY;  Service: Gastroenterology;;   Francine Graven REMOVAL  04/09/2022   Procedure: STENT REMOVAL;  Surgeon: Lemar Lofty., MD;   Location: [facility] ENDOSCOPY;  Service: Gastroenterology;;   SUBMUCOSAL TATTOO INJECTION  11/30/2021   Procedure: SUBMUCOSAL TATTOO INJECTION;  Surgeon: Lemar Lofty., MD;  Location: [facility] ENDOSCOPY;  Service: Gastroenterology;;   TONSILLECTOMY     45 years ago   UPPER GASTROINTESTINAL ENDOSCOPY     HPI:  Pt is a 83 y.o. female who presented for the evaluation of elevated troponins at the request of teaching services. CXR (2/14) revealed "Bibasilar densities, favor atelectasis although pneumonia cannot be  completely excluded". Previous SLP eval (01/25/24) revealed no s/sx of aspiration or oral dysphagia. Reg diet/thin liquids recommended at that time. PMH: complete heart block status post PPM, type 2 diabetes, GERD, depression, documented chronic cognitive communication deficit.    Assessment / Plan / Recommendation  Clinical Impression  Pt's swallow function at bedside appears unchanged from previous SLP swallow evaluation on 01/25/24. Oral mechanism examination is [facility] and she has few missing dentition, several dentition in poor condition. Rapid intake of thin liquid by straw, bites of puree and regular textured solids were self-fed without overt signs of aspiration to follow. All POs easily cleared from oral cavity. Pt expressed no concerns or changes regarding swallow function. When given the option of advancing to regular diet, pt stated preference for dysphagia 3 (mechanical soft) diet. Will continue with current diet and universal swallow precautions. No further SLP f/u indicated at this time  and will s/o.  SLP Visit Diagnosis: Dysphagia, unspecified (R13.10)    Aspiration Risk  No limitations    Diet Recommendation Dysphagia 3 (Mech soft);Thin liquid    Liquid Administration via: Cup;Straw Medication Administration: Whole meds with liquid Supervision: Patient able to self feed Compensations: Minimize environmental distractions;Slow rate;Small sips/bites Postural Changes: Seated upright  at 90 degrees    Other  Recommendations Oral Care Recommendations: Oral care BID    Recommendations for follow up therapy are one component of a multi-disciplinary discharge planning process, led by the attending physician.  Recommendations may be updated based on patient status, additional functional criteria and insurance authorization.  Follow up Recommendations No SLP follow up      Assistance Recommended at Discharge    Functional Status Assessment Patient has not had a recent decline in their functional status  Frequency and Duration            Prognosis        Swallow Study   General Date of Onset: 02/08/24 HPI: Pt is a 83 y.o. female who presented for the evaluation of elevated troponins at the request of teaching services. CXR (2/14) revealed "Bibasilar densities, favor atelectasis although pneumonia cannot be  completely excluded". Previous SLP eval (01/25/24) revealed no s/sx of aspiration or oral dysphagia. Reg diet/thin liquids recommended at that time. PMH: complete heart block status post PPM, type 2 diabetes, GERD, depression, documented chronic cognitive communication deficit. Type of Study: Bedside Swallow Evaluation Previous Swallow Assessment: see HPI Diet Prior to this Study: Dysphagia 3 (mechanical soft);Thin liquids (Level 0) Temperature Spikes Noted: No Respiratory Status: Nasal cannula History of Recent Intubation: No Behavior/Cognition: Alert;Cooperative;Pleasant mood Oral Cavity Assessment: Within Functional Limits Oral Care Completed by SLP: No Oral Cavity - Dentition: Poor condition;Missing dentition Vision: Functional for self-feeding Self-Feeding Abilities: Able to feed self Patient Positioning: Upright in bed;Postural control adequate for testing Baseline Vocal Quality: Normal Volitional Cough: Weak Volitional Swallow: Unable to elicit (reported she had nothing to swallow)    Oral/Motor/Sensory Function Overall Oral Motor/Sensory Function: Within  functional limits   Ice Chips Ice chips: Not tested   Thin Liquid Thin Liquid: Within functional limits Presentation: Self Fed;Straw    Nectar Thick Nectar Thick Liquid: Not tested   Honey Thick Honey Thick Liquid: Not tested   Puree Puree: Within functional limits Presentation: Self Fed;Spoon   Solid     Solid: Within functional limits Presentation: Self Fed       Avie Echevaria, MA, CCC-SLP Acute Rehabilitation Services Office Number: [phone]  Paulette Blanch 02/08/2024,12:49 PM

## 2024-02-08 NOTE — Assessment & Plan Note (Addendum)
 Trop elevated to 104 on admission and then up-trended to 128, likely in the setting of demand ischemia 2/2 hyperglycemia.  EKG demonstrated ventricular paced rhythm.  Cardiology recommended continuing Crestor, does not think she was a good candidate for DAPT due to history of frequent falls, and advised no further workup.

## 2024-02-08 NOTE — Progress Notes (Addendum)
 Daily Progress Note Intern Pager: [phone]  Patient name: Judith [name] Medical record number: [id number] Date of birth: 09/18/1941 Age: 83 y.o. Gender: female  Primary Care Provider: Darral Dash, DO Consultants: None Code Status: Full Code  Pt Overview and Major Events to Date:  2/13: Admitted  Assessment and Plan: Judith [name] is a 83 y.o. female with past medical history T2DM, multilevel DDD, peripheral neuropathy, depression, unintentional weight loss, and complete heart block s/p pacemaker presenting with hyperglycemia to 746 in the ED requiring Endotool, received 78.5 units total with rapid response within 4 hours.   Patient also has elevated troponins on admission possibly due to demand ischemia.  EKG demonstrated ventricular paced rhythm.  Consulted cardiology and was recommended continuing Crestor, otherwise have no further medical intervention that they recommend at this time. Assessment & Plan Hyperglycemia Likely HHS with sugars of 746 upon admission.  Endotool was utilized and then discontinued once BG read 86.  Sugars have stabilized and are now in the 200-250 range, will start sensitive SSI. - Monitor CBG Long-term insulin use in type 2 diabetes (HCC) Previously on 20 units daily with sliding scale when confirmed through dispensing pharmacy, last A1c 6.2, 6 days ago. Apparently this was discontinued per chart review, unsure if she was still receiving this at facility prior to last admission.  During her last admission she had episodes of hypoglycemia and decreased appetite, sugars were checked but then CBGs and SSI were discontinued and she was not discharged with any medications.  Unclear cause of glucose fluctuation. - CBG and sensitive SSI - Daughter will confirm with facility on what her insulin regimen was previously Elevated troponin  Demand Ischemia Trop elevated to 104 on admission and then up-trended to 128, likely in the setting of demand ischemia 2/2  hyperglycemia.  EKG demonstrated ventricular paced rhythm.  Cardiology recommended continuing Crestor, does not think she was a good candidate for DAPT due to history of frequent falls, and advised no further workup.  Chronic and Stable Problems:  HTN: Coreg 6.25 BID at home. Holding in setting of drowsiness and hypotensive HLD: Crestor 10 mg daily, holding in setting of NPO DDD: Chronic issue. No sign of fracture or listhesis in CT neck. Peripheral neuropathy: On Gabapentin 400 mg at bedtime Depression: On Cymbalta 60 mg daily  Complete Heart Block with pacemaker: Echo demonstrated Ef 50-55% at previous admission. PPM interrogation was performed then as well, demonstrated old leads, unsafe for MRI. Pulmonary nodules: Incidental 3 mm left upper pulmonary nodule found 05/2023 will monitor with low dose CT in 6-12 months, will put in follow up instructions in discharge summary  Sleep Apnea: Not on CPAP Gastroparesis: Hx of partial gastrectomy  Chronic pain: Lidocaine patch provided for back  FEN/GI: Dysphagia 3 diet PPx: Lovenox Dispo: Pending clinical improvement  Subjective:  Had a long discussion this morning with daughter at bedside regarding patient care at her facility and updates on what we are doing here.  She discussed with me that they would like her to be a DNR at this time, as her MPOA.  She would also like to discuss care further with palliative.  Judith [name] was able to converse with me well this morning as she was more awake on exam.  She she was oriented to self, place, month, and year.  She does not know why she does not feel well but has no specific complaints.  Was able to eat her breakfast fully this morning.  Objective: Temp:  [  97.6 F (36.4 C)-99.3 F (37.4 C)] 99.3 F (37.4 C) (02/15 1222) Pulse Rate:  [61-89] 79 (02/15 1048) Resp:  [13-26] 16 (02/15 0420) BP: (116-165)/(57-110) 165/80 (02/15 1222) SpO2:  [84 %-100 %] 95 % (02/15 1048) Weight:  [47.7 kg] 47.7 kg (02/14  1603) Physical Exam: General: Frail, ill-appearing female in NAD. Cardiovascular: RRR. No M/R/G. Respiratory: CTAB. Normal WOB on RA.  Abdomen: Soft, nontender, nondistended. Normoactive bowel sounds.  Extremities: No BLE edema.  1+ edema noted in right hand. Neuro: Oriented to self, location, month and year. Laboratory: Most recent CBC Lab Results  Component Value Date   WBC 12.4 (H) 02/08/2024   HGB 10.7 (L) 02/08/2024   HCT 32.7 (L) 02/08/2024   MCV 100.3 (H) 02/08/2024   PLT 194 02/08/2024   Most recent BMP    Latest Ref Rng & Units 02/08/2024    5:38 AM  BMP  Glucose 70 - 99 mg/dL 119   BUN 8 - 23 mg/dL 5   Creatinine 1.47 - 8.29 mg/dL 5.62   Sodium 130 - 865 mmol/L 135   Potassium 3.5 - 5.1 mmol/L 3.6   Chloride 98 - 111 mmol/L 101   CO2 22 - 32 mmol/L 27   Calcium 8.9 - 10.3 mg/dL 8.5     Imaging/Diagnostic Tests: No new imaging.  Fortunato Curling, DO 02/08/2024, 12:48 PM  PGY-1, [facility] FPTS Intern pager: [phone], text pages welcome Secure chat group [facility] Teaching Service

## 2024-02-08 NOTE — Assessment & Plan Note (Addendum)
 Previously on 20 units daily with sliding scale when confirmed through dispensing pharmacy, last A1c 6.2, 6 days ago. Apparently this was discontinued per chart review, unsure if she was still receiving this at facility prior to last admission.  During her last admission she had episodes of hypoglycemia and decreased appetite, sugars were checked but then CBGs and SSI were discontinued and she was not discharged with any medications.  Unclear cause of glucose fluctuation. - CBG and sensitive SSI - Daughter will confirm with facility on what her insulin regimen was previously

## 2024-02-08 NOTE — Assessment & Plan Note (Addendum)
 Likely HHS with sugars of 746 upon admission.  Endotool was utilized and then discontinued once BG read 86.  Sugars have stabilized and are now in the 200-250 range, will start sensitive SSI. - Monitor CBG

## 2024-02-09 ENCOUNTER — Encounter: Payer: Self-pay | Admitting: Physician Assistant

## 2024-02-09 DIAGNOSIS — R739 Hyperglycemia, unspecified: Secondary | ICD-10-CM | POA: Diagnosis not present

## 2024-02-09 LAB — BASIC METABOLIC PANEL
Anion gap: 9 (ref 5–15)
BUN: 7 mg/dL — ABNORMAL LOW (ref 8–23)
CO2: 26 mmol/L (ref 22–32)
Calcium: 8.3 mg/dL — ABNORMAL LOW (ref 8.9–10.3)
Chloride: 99 mmol/L (ref 98–111)
Creatinine, Ser: 0.62 mg/dL (ref 0.44–1.00)
GFR, Estimated: >60 mL/min (ref >60)
Glucose, Bld: 322 mg/dL — ABNORMAL HIGH (ref 70–99)
Potassium: 3.7 mmol/L (ref 3.5–5.1)
Sodium: 134 mmol/L — ABNORMAL LOW (ref 135–145)

## 2024-02-09 LAB — CALCIUM, IONIZED: Calcium, Ionized, Serum: 5 mg/dL (ref 4.5–5.6)

## 2024-02-09 LAB — GLUCOSE, CAPILLARY
Glucose-Capillary: 315 mg/dL — ABNORMAL HIGH (ref 70–99)
Glucose-Capillary: 232 mg/dL — ABNORMAL HIGH (ref 70–99)
Glucose-Capillary: 204 mg/dL — ABNORMAL HIGH (ref 70–99)
Glucose-Capillary: 299 mg/dL — ABNORMAL HIGH (ref 70–99)

## 2024-02-09 LAB — CBC
HCT: 28.3 % — ABNORMAL LOW (ref 36.0–46.0)
Hemoglobin: 9.4 g/dL — ABNORMAL LOW (ref 12.0–15.0)
MCH: 33.8 pg (ref 26.0–34.0)
MCHC: 33.2 g/dL (ref 30.0–36.0)
MCV: 101.8 fL — ABNORMAL HIGH (ref 80.0–100.0)
Platelets: 166 10*3/uL (ref 150–400)
RBC: 2.78 MIL/uL — ABNORMAL LOW (ref 3.87–5.11)
RDW: 14.6 % (ref 11.5–15.5)
WBC: 8.8 10*3/uL (ref 4.0–10.5)
nRBC: 0 % (ref 0.0–0.2)

## 2024-02-09 MED ORDER — INSULIN GLARGINE-YFGN 100 UNIT/ML ~~LOC~~ SOLN
8.0000 [IU] | Freq: Every day | SUBCUTANEOUS | Status: DC
  Administered 2024-02-09: 8 [IU] via SUBCUTANEOUS
  Filled 2024-02-09 (×2): qty 0.08

## 2024-02-09 NOTE — Plan of Care (Signed)

## 2024-02-09 NOTE — Progress Notes (Signed)
     Daily Progress Note Intern Pager: [phone]  Patient name: Judith [name] Medical record number: [id number] Date of birth: 1941-09-24 Age: 83 y.o. Gender: female  Primary Care Provider: Darral Dash, DO Consultants: None Code Status: DNR   Pt Overview and Major Events to Date:  2/13: admitted   Assessment and Plan: Patient is an 83 year old female with past medical history of type 2 diabetes, peripheral neuropathy, depression, unintentional weight loss, and complete heart block status post pacemaker who presented on 2/13 with hyperglycemia to 146.  Patient is now status post Endo tool (d/c 2/14) and currently on sensitive sliding scale insulin.  CBGs continue to be elevated into the 200s overnight, will plan to add on long-acting insulin today.  Etiology for hyperglycemia still unclear given patient's A1c is normal, though may reflect an acute increase in glucose given recent discontinuation of insulin at last admission.   Assessment & Plan Long-term insulin use in type 2 diabetes [facility]) Per daughter, patient on 20 U Lantus with no sliding. Overnight, continues to have sugars in the high 200s/low 300s and received a total of 10 units NovoLog yesterday.  Given consistently increased CBG, will plan to add on long-acting insulin  - add 8 U LAI with sSSI - CBG checks q4h - PT/OT to see   FEN/GI: Dysphagia 3 diet  PPx: lovenox Dispo: likely back to SNF pending clinical improvement  Subjective:  Patient eating breakfast this AM with help of NT. No acute complaints   Objective: Temp:  [97.8 F (36.6 C)-99.3 F (37.4 C)] 97.8 F (36.6 C) (02/16 0600) Pulse Rate:  [74] 74 (02/16 0600) Resp:  [18] 18 (02/16 0600) BP: (165)/(80) 165/80 (02/15 1222) Physical Exam: General: chronically ill appearing female, NAD Cardiovascular: RRR, no m/r/g Respiratory: NWOB on RA Abdomen: soft, NTND Extremities: no LE edema noted   Laboratory: Most recent CBC Lab Results  Component Value  Date   WBC 8.8 02/09/2024   HGB 9.4 (L) 02/09/2024   HCT 28.3 (L) 02/09/2024   MCV 101.8 (H) 02/09/2024   PLT 166 02/09/2024   Most recent BMP    Latest Ref Rng & Units 02/09/2024    5:00 AM  BMP  Glucose 70 - 99 mg/dL 562   BUN 8 - 23 mg/dL 7   Creatinine 1.30 - 8.65 mg/dL 7.84   Sodium 696 - 295 mmol/L 134   Potassium 3.5 - 5.1 mmol/L 3.7   Chloride 98 - 111 mmol/L 99   CO2 22 - 32 mmol/L 26   Calcium 8.9 - 10.3 mg/dL 8.3    CBG (last 3)  Recent Labs    02/08/24 1623 02/08/24 2136 02/09/24 0746  GLUCAP 236* 245* 315*    Imaging/Diagnostic Tests: No new imaging Georg Ruddle Honore Wipperfurth, MD 02/09/2024, 11:03 AM  PGY-1, [facility] FPTS Intern pager: [phone], text pages welcome Secure chat group [facility] Teaching Service

## 2024-02-09 NOTE — Progress Notes (Signed)
 4-6 week f/u appointment scheduled as per Dr. Lupe Carney request, relayed on AVS

## 2024-02-09 NOTE — Assessment & Plan Note (Addendum)
 Per daughter, patient on 12 U Lantus with no sliding. Overnight, continues to have sugars in the high 200s/low 300s and received a total of 10 units NovoLog yesterday.  Given consistently increased CBG, will plan to add on long-acting insulin  - add 8 U LAI with sSSI - CBG checks q4h - PT/OT to see

## 2024-02-09 NOTE — Plan of Care (Signed)

## 2024-02-10 DIAGNOSIS — R739 Hyperglycemia, unspecified: Secondary | ICD-10-CM | POA: Diagnosis not present

## 2024-02-10 LAB — GLUCOSE, CAPILLARY
Glucose-Capillary: 222 mg/dL — ABNORMAL HIGH (ref 70–99)
Glucose-Capillary: 268 mg/dL — ABNORMAL HIGH (ref 70–99)
Glucose-Capillary: 424 mg/dL — ABNORMAL HIGH (ref 70–99)
Glucose-Capillary: 491 mg/dL — ABNORMAL HIGH (ref 70–99)
Glucose-Capillary: 334 mg/dL — ABNORMAL HIGH (ref 70–99)

## 2024-02-10 LAB — CBC
HCT: 28.5 % — ABNORMAL LOW (ref 36.0–46.0)
Hemoglobin: 9.4 g/dL — ABNORMAL LOW (ref 12.0–15.0)
MCH: 33.3 pg (ref 26.0–34.0)
MCHC: 33 g/dL (ref 30.0–36.0)
MCV: 101.1 fL — ABNORMAL HIGH (ref 80.0–100.0)
Platelets: 170 10*3/uL (ref 150–400)
RBC: 2.82 MIL/uL — ABNORMAL LOW (ref 3.87–5.11)
RDW: 14.5 % (ref 11.5–15.5)
WBC: 8.9 10*3/uL (ref 4.0–10.5)
nRBC: 0 % (ref 0.0–0.2)

## 2024-02-10 MED ORDER — INSULIN GLARGINE-YFGN 100 UNIT/ML ~~LOC~~ SOLN
10.0000 [IU] | Freq: Every day | SUBCUTANEOUS | Status: DC
  Administered 2024-02-10: 10 [IU] via SUBCUTANEOUS
  Filled 2024-02-10 (×2): qty 0.1

## 2024-02-10 MED ORDER — DICLOFENAC SODIUM 1 % EX GEL
2.0000 g | Freq: Four times a day (QID) | CUTANEOUS | Status: DC | PRN
  Administered 2024-02-11: 2 g via TOPICAL
  Filled 2024-02-10: qty 100

## 2024-02-10 MED ORDER — ENSURE ENLIVE PO LIQD
237.0000 mL | Freq: Two times a day (BID) | ORAL | Status: DC
  Administered 2024-02-10 – 2024-02-11 (×3): 237 mL via ORAL

## 2024-02-10 NOTE — Plan of Care (Signed)
 Paged by nursing for BG > 400, verbal order for 10u SAI given, also asked to repeat CBG in 30 minutes.

## 2024-02-10 NOTE — Plan of Care (Signed)
   Problem: Education: Goal: Knowledge of General Education information will improve Description: Including pain rating scale, medication(s)/side effects and non-pharmacologic comfort measures Outcome: Progressing   Problem: Health Behavior/Discharge Planning: Goal: Ability to manage health-related needs will improve Outcome: Progressing   Problem: Clinical Measurements: Goal: Will remain free from infection Outcome: Progressing

## 2024-02-10 NOTE — Assessment & Plan Note (Addendum)
 CBGs better controlled overnight. Received 16 U SSI overnight - increase to 10 U LAI with sSSI - CBG checks q4h - PT/OT to see

## 2024-02-10 NOTE — Plan of Care (Signed)
  Problem: Education: Goal: Knowledge of General Education information will improve Description: Including pain rating scale, medication(s)/side effects and non-pharmacologic comfort measures Outcome: Progressing   Problem: Health Behavior/Discharge Planning: Goal: Ability to manage health-related needs will improve Outcome: Progressing   Problem: Clinical Measurements: Goal: Will remain free from infection Outcome: Progressing Goal: Diagnostic test results will improve Outcome: Progressing Goal: Respiratory complications will improve Outcome: Progressing Goal: Cardiovascular complication will be avoided Outcome: Progressing   Problem: Activity: Goal: Risk for activity intolerance will decrease Outcome: Progressing   Problem: Nutrition: Goal: Adequate nutrition will be maintained Outcome: Progressing   Problem: Coping: Goal: Level of anxiety will decrease Outcome: Progressing   Problem: Pain Managment: Goal: General experience of comfort will improve and/or be controlled Outcome: Progressing   Problem: Safety: Goal: Ability to remain free from injury will improve Outcome: Progressing   Problem: Skin Integrity: Goal: Risk for impaired skin integrity will decrease Outcome: Progressing   Problem: Education: Goal: Ability to describe self-care measures that may prevent or decrease complications (Diabetes Survival Skills Education) will improve Outcome: Progressing Goal: Individualized Educational Video(s) Outcome: Progressing   Problem: Coping: Goal: Ability to adjust to condition or change in health will improve Outcome: Progressing   Problem: Fluid Volume: Goal: Ability to maintain a balanced intake and output will improve Outcome: Progressing   Problem: Health Behavior/Discharge Planning: Goal: Ability to identify and utilize available resources and services will improve Outcome: Progressing Goal: Ability to manage health-related needs will improve Outcome:  Progressing   Problem: Metabolic: Goal: Ability to maintain appropriate glucose levels will improve Outcome: Progressing   Problem: Nutritional: Goal: Maintenance of adequate nutrition will improve Outcome: Progressing Goal: Progress toward achieving an optimal weight will improve Outcome: Progressing   Problem: Skin Integrity: Goal: Risk for impaired skin integrity will decrease Outcome: Progressing   Problem: Tissue Perfusion: Goal: Adequacy of tissue perfusion will improve Outcome: Progressing

## 2024-02-10 NOTE — Plan of Care (Signed)
  Problem: Nutrition: Goal: Adequate nutrition will be maintained Outcome: Progressing   Problem: Coping: Goal: Level of anxiety will decrease Outcome: Progressing   Problem: Pain Managment: Goal: General experience of comfort will improve and/or be controlled Outcome: Progressing   Problem: Health Behavior/Discharge Planning: Goal: Ability to manage health-related needs will improve Outcome: Not Progressing-anticipate discharge back to SNF when ready   Problem: Clinical Measurements: Goal: Ability to maintain clinical measurements within normal limits will improve Outcome: Completed/Met   Problem: Elimination: Goal: Will not experience complications related to bowel motility Outcome: Completed/Met Goal: Will not experience complications related to urinary retention Outcome: Completed/Met

## 2024-02-10 NOTE — Evaluation (Signed)
 Occupational Therapy Evaluation Patient Details Name: Judith [name] MRN: [id number] DOB: 02/18/1941 Today's Date: 02/10/2024   History of Present Illness   Pt is an 83 y/o F presenting to ED on 2/13 from [facility] with hyperglycemia and mild DKA. PMH includes DM2, diabetic neuropathy, cognitive deficit, arthritis, GERD, HTN, HLD, gout, osteoporosis, sleep apnea.     Clinical Impressions Pt reports using rollator at baseline for short distances, has assist from SNF staff for ADLs, pt currently needing up yo max A for ADLs, min A for bed mobility and mod A for transfers with RW. Pt needing incr motivation to participate/mobilize OOB 2/2 BLE pain. Pt presenting with impairments listed below, will follow acutely. Patient will benefit from continued inpatient follow up therapy, <3 hours/day to maximize safety/ind with ADL/functional mobility.      If plan is discharge home, recommend the following:   A lot of help with bathing/dressing/bathroom;A lot of help with walking and/or transfers;Assistance with cooking/housework;Direct supervision/assist for medications management;Direct supervision/assist for financial management;Assist for transportation;Supervision due to cognitive status     Functional Status Assessment   Patient has had a recent decline in their functional status and demonstrates the ability to make significant improvements in function in a reasonable and predictable amount of time.     Equipment Recommendations   Other (comment) (defer)     Recommendations for Other Services   PT consult     Precautions/Restrictions   Precautions Precautions: Fall Restrictions Weight Bearing Restrictions Per Provider Order: No     Mobility Bed Mobility Overal bed mobility: Needs Assistance Bed Mobility: Supine to Sit     Supine to sit: Min assist          Transfers Overall transfer level: Needs assistance Equipment used: Rolling walker (2  wheels) Transfers: Sit to/from Stand Sit to Stand: Mod assist                  Balance Overall balance assessment: History of Falls, Needs assistance Sitting-balance support: Bilateral upper extremity supported, Feet supported Sitting balance-Leahy Scale: Fair     Standing balance support: Bilateral upper extremity supported, Reliant on assistive device for balance Standing balance-Leahy Scale: Poor Standing balance comment: reliant on RW for support                           ADL either performed or assessed with clinical judgement   ADL Overall ADL's : Needs assistance/impaired Eating/Feeding: Set up;Sitting   Grooming: Set up;Sitting   Upper Body Bathing: Moderate assistance;Sitting   Lower Body Bathing: Maximal assistance;Sitting/lateral leans   Upper Body Dressing : Moderate assistance;Sitting   Lower Body Dressing: Maximal assistance;Sitting/lateral leans   Toilet Transfer: Moderate assistance;Stand-pivot;Squat-pivot;BSC/3in1;Rolling walker (2 wheels)   Toileting- Clothing Manipulation and Hygiene: Moderate assistance       Functional mobility during ADLs: Moderate assistance;Rolling walker (2 wheels)       Vision   Vision Assessment?: No apparent visual deficits     Perception Perception: Not tested       Praxis Praxis: Not tested       Pertinent Vitals/Pain Pain Assessment Pain Assessment: Faces Pain Score: 8  Faces Pain Scale: Hurts whole lot Pain Location: BLEs Pain Descriptors / Indicators: Discomfort Pain Intervention(s): Limited activity within patient's tolerance, Monitored during session, Repositioned     Extremity/Trunk Assessment Upper Extremity Assessment Upper Extremity Assessment: Generalized weakness   Lower Extremity Assessment Lower Extremity Assessment: Defer to PT evaluation  Cervical / Trunk Assessment Cervical / Trunk Assessment: Kyphotic   Communication Communication Communication: No apparent  difficulties   Cognition Arousal: Alert Behavior During Therapy: Flat affect Cognition: History of cognitive impairments             OT - Cognition Comments: hx of cognitive deficit, needs incr time and motivation to mobilize/participate                 Following commands: Intact       Cueing  General Comments          Exercises     Shoulder Instructions      Home Living Family/patient expects to be discharged to:: Skilled nursing facility (Currently at [facility], was at [facility] prior to that)                             Home Equipment: Rollator (4 wheels);Rolling Walker (2 wheels)   Additional Comments: assist from SNF staff      Prior Functioning/Environment Prior Level of Function : Needs assist             Mobility Comments: Rollator/RW baseline walks short distances to bathroom and back ADLs Comments: assist from SNF staff    OT Problem List: Decreased strength;Decreased activity tolerance;Impaired balance (sitting and/or standing);Decreased cognition;Decreased knowledge of use of DME or AE;Decreased knowledge of precautions   OT Treatment/Interventions: Self-care/ADL training;Therapeutic exercise;Energy conservation;DME and/or AE instruction;Therapeutic activities;Patient/family education;Balance training      OT Goals(Current goals can be found in the care plan section)   Acute Rehab OT Goals Patient Stated Goal: none stated OT Goal Formulation: With patient Time For Goal Achievement: 02/24/24 Potential to Achieve Goals: Good ADL Goals Pt Will Perform Upper Body Dressing: with supervision;sitting Pt Will Perform Lower Body Dressing: with min assist;sitting/lateral leans;sit to/from stand Pt Will Transfer to Toilet: with contact guard assist;ambulating;regular height toilet Additional ADL Goal #1: pt will perform bed mobility with supervision in prep for ADLs   OT Frequency:  Min 1X/week    Co-evaluation               AM-PAC OT "6 Clicks" Daily Activity     Outcome Measure Help from another person eating meals?: A Little Help from another person taking care of personal grooming?: A Little Help from another person toileting, which includes using toliet, bedpan, or urinal?: A Lot Help from another person bathing (including washing, rinsing, drying)?: A Lot Help from another person to put on and taking off regular upper body clothing?: A Little Help from another person to put on and taking off regular lower body clothing?: A Lot 6 Click Score: 15   End of Session Equipment Utilized During Treatment: Gait belt;Rolling walker (2 wheels) Nurse Communication: Mobility status  Activity Tolerance: Patient tolerated treatment well Patient left: in bed;with call bell/phone within reach;with bed alarm set;with family/visitor present  OT Visit Diagnosis: Unsteadiness on feet (R26.81);Other abnormalities of gait and mobility (R26.89);History of falling (Z91.81);Muscle weakness (generalized) (M62.81);Other symptoms and signs involving cognitive function;Adult, failure to thrive (R62.7)                Time: 1040-1100 OT Time Calculation (min): 20 min Charges:  OT General Charges $OT Visit: 1 Visit OT Evaluation $OT Eval Moderate Complexity: 1 Mod  Ligia Duguay K, OTD, OTR/L SecureChat Preferred Acute Rehab [phone]   Carver Fila Koonce 02/10/2024, 12:28 PM

## 2024-02-10 NOTE — Evaluation (Signed)
 Physical Therapy Evaluation Patient Details Name: Judith [name] MRN: [id number] DOB: [birth year] Today's Date: 02/10/2024  History of Present Illness  Pt is an 83 y/o F presenting to ED on 2/13 from [facility] with hyperglycemia and mild DKA. PMH includes DM2, diabetic neuropathy, cognitive deficit, arthritis, GERD, HTN, HLD, gout, osteoporosis, sleep apnea.  Clinical Impression  Pt admitted with above diagnosis. Pt was able to stand and pivot needing up to mod assist for bed to chair transfer as pt c/o right LE pain.  Pt refused to ambulate due to pain.  Performed a few exercises and lunch came therefore set pt up with her lunch.  Pt currently with functional limitations due to the deficits listed below (see PT Problem List). Pt will benefit from acute skilled PT to increase their independence and safety with mobility to allow discharge.           If plan is discharge home, recommend the following: A little help with bathing/dressing/bathroom;Assistance with cooking/housework;Assist for transportation;Help with stairs or ramp for entrance;Direct supervision/assist for medications management;A lot of help with walking and/or transfers   Can travel by private vehicle   Yes    Equipment Recommendations None recommended by PT  Recommendations for Other Services       Functional Status Assessment Patient has had a recent decline in their functional status and demonstrates the ability to make significant improvements in function in a reasonable and predictable amount of time.     Precautions / Restrictions Precautions Precautions: Fall Restrictions Weight Bearing Restrictions Per Provider Order: No      Mobility  Bed Mobility Overal bed mobility: Needs Assistance Bed Mobility: Supine to Sit     Supine to sit: Min assist     General bed mobility comments: increased time and cues for rail use    Transfers Overall transfer level: Needs assistance Equipment used: Rolling  walker (2 wheels) Transfers: Sit to/from Stand Sit to Stand: Min assist, From elevated surface   Step pivot transfers: Mod assist       General transfer comment: Min A to stand from bed. Patient able to push off using UE support and bilateral LE support however unable to bear full weight on right LE due to pain. Pt only able to take pivotal steps from bed to chair with mod assist due to right LE pain.    Ambulation/Gait                  Stairs            Wheelchair Mobility     Tilt Bed    Modified Rankin (Stroke Patients Only)       Balance Overall balance assessment: History of Falls, Needs assistance Sitting-balance support: Feet supported, No upper extremity supported, Single extremity supported Sitting balance-Leahy Scale: Fair Sitting balance - Comments: supervision seated on EOB   Standing balance support: Bilateral upper extremity supported, Reliant on assistive device for balance Standing balance-Leahy Scale: Poor Standing balance comment: reliant on RW and external support                             Pertinent Vitals/Pain Pain Assessment Pain Assessment: Faces Faces Pain Scale: Hurts whole lot Breathing: normal Negative Vocalization: none Facial Expression: smiling or inexpressive Body Language: relaxed Consolability: no need to console PAINAD Score: 0 Pain Location: BLEs Pain Descriptors / Indicators: Discomfort Pain Intervention(s): Limited activity within patient's tolerance, Monitored during session,  Repositioned    Home Living Family/patient expects to be discharged to:: Skilled nursing facility (Currently at [facility], was at [facility] prior to that)                 Home Equipment: Rollator (4 wheels);Rolling Walker (2 wheels) Additional Comments: assist from SNF staff    Prior Function Prior Level of Function : Needs assist       Physical Assist : ADLs (physical);Mobility (physical) Mobility  (physical): Transfers;Gait ADLs (physical): Bathing;Dressing;IADLs Mobility Comments: Rollator/RW baseline walks short distances to bathroom and back ADLs Comments: assist from SNF staff     Extremity/Trunk Assessment   Upper Extremity Assessment Upper Extremity Assessment: Defer to OT evaluation    Lower Extremity Assessment Lower Extremity Assessment: RLE deficits/detail RLE Deficits / Details: ROM WFL RLE: Unable to fully assess due to pain    Cervical / Trunk Assessment Cervical / Trunk Assessment: Kyphotic Cervical / Trunk Exceptions: daughter reports pt has recently been having difficulty with neck extension. tends to sit in cervical/trunk flexion.  Communication   Communication Communication: No apparent difficulties    Cognition Arousal: Alert Behavior During Therapy: Flat affect   PT - Cognitive impairments: No apparent impairments                         Following commands: Intact       Cueing       General Comments General comments (skin integrity, edema, etc.): VSS    Exercises General Exercises - Lower Extremity Long Arc Quad: AROM, Both, 10 reps, Seated Hip Flexion/Marching: AROM, 10 reps, Seated, Both   Assessment/Plan    PT Assessment Patient needs continued PT services  PT Problem List Decreased strength;Decreased activity tolerance;Decreased balance;Decreased mobility;Decreased cognition;Decreased safety awareness       PT Treatment Interventions DME instruction;Gait training;Functional mobility training;Therapeutic activities;Therapeutic exercise;Balance training;Patient/family education    PT Goals (Current goals can be found in the Care Plan section)  Acute Rehab PT Goals Patient Stated Goal: did not state PT Goal Formulation: With patient Time For Goal Achievement: 02/24/24 Potential to Achieve Goals: Fair    Frequency Min 1X/week     Co-evaluation               AM-PAC PT "6 Clicks" Mobility  Outcome Measure  Help needed turning from your back to your side while in a flat bed without using bedrails?: A Little Help needed moving from lying on your back to sitting on the side of a flat bed without using bedrails?: A Little Help needed moving to and from a bed to a chair (including a wheelchair)?: A Lot Help needed standing up from a chair using your arms (e.g., wheelchair or bedside chair)?: A Little Help needed to walk in hospital room?: Total Help needed climbing 3-5 steps with a railing? : Total 6 Click Score: 13    End of Session Equipment Utilized During Treatment: Gait belt Activity Tolerance: Patient limited by fatigue Patient left: in chair;with call bell/phone within reach;with chair alarm set Nurse Communication: Mobility status PT Visit Diagnosis: Unsteadiness on feet (R26.81);History of falling (Z91.81)    Time: 1200-1212 PT Time Calculation (min) (ACUTE ONLY): 12 min   Charges:   PT Evaluation $PT Eval Moderate Complexity: 1 Mod   PT General Charges $$ ACUTE PT VISIT: 1 Visit         Lauri Purdum M,PT Acute Rehab Services [phone]   Bevelyn Buckles 02/10/2024, 12:45 PM

## 2024-02-10 NOTE — Assessment & Plan Note (Signed)
-   Palliative consulted, appreciate recs

## 2024-02-10 NOTE — Progress Notes (Signed)
     Daily Progress Note Intern Pager: [phone]  Patient name: Judith [name] Medical record number: [id number] Date of birth: [birth year] Age: 83 y.o. Gender: female  Primary Care Provider: Darral Dash, DO Consultants: None Code Status: DNR  Pt Overview and Major Events to Date:  2/13: admitted   Assessment and Plan: Patient is 83 yo F with PMHx of DMII, peripheral neuropathy, depression, incomplete heart block with pacemaker admitted in the setting of HHS. Blood sugars improved overnight.  Assessment & Plan Long-term insulin use in type 2 diabetes (HCC) CBGs better controlled overnight. Received 16 U SSI overnight - increase to 10 U LAI with sSSI - CBG checks q4h - PT/OT to see   Goals of care, counseling/discussion - Palliative consulted, appreciate recs    Chronic and Stable Problems:  HTN: Coreg 6.25 BID at home. Holding in setting of normotension HLD: Crestor 10 mg daily Peripheral neuropathy: On Gabapentin 400 mg at bedtime Depression: On Cymbalta 60 mg daily  Sleep Apnea: Not on CPAP Gastroparesis: Hx of partial gastrectomy  Chronic pain: Lidocaine patch provided for back     FEN/GI: dysphagia 3 PPx: lovenox Dispo: SNF pending dispo planning  Subjective:  Patient complaining of bilateral knee pain this AM. Otherwise NAD   Objective: Temp:  [97.6 F (36.4 C)-98.1 F (36.7 C)] 98.1 F (36.7 C) (02/17 0412) Pulse Rate:  [80-85] 85 (02/16 1920) Resp:  [17-19] 17 (02/17 0412) BP: (121-148)/(62-73) 148/73 (02/17 0412) SpO2:  [94 %-98 %] 97 % (02/17 0412) Physical Exam: General: chronically ill female, NAD  Cardiovascular: paced rhythm, no m/r/g  Respiratory: NWOB on RA Abdomen: soft, NTND Extremities: no LE edema noted   Laboratory: Most recent CBC Lab Results  Component Value Date   WBC 8.9 02/10/2024   HGB 9.4 (L) 02/10/2024   HCT 28.5 (L) 02/10/2024   MCV 101.1 (H) 02/10/2024   PLT 170 02/10/2024   Most recent BMP    Latest Ref Rng &  Units 02/09/2024    5:00 AM  BMP  Glucose 70 - 99 mg/dL 284   BUN 8 - 23 mg/dL 7   Creatinine 1.32 - 4.40 mg/dL 1.02   Sodium 725 - 366 mmol/L 134   Potassium 3.5 - 5.1 mmol/L 3.7   Chloride 98 - 111 mmol/L 99   CO2 22 - 32 mmol/L 26   Calcium 8.9 - 10.3 mg/dL 8.3     CBG (last 3)  Recent Labs    02/09/24 1637 02/09/24 2145 02/10/24 0745  GLUCAP 204* 299* 222*    Imaging/Diagnostic Tests: No new imaging  Judith Ruddle Kaesen Rodriguez, MD 02/10/2024, 7:37 AM  PGY-1, [name] FPTS Intern pager: [phone], text pages welcome Secure chat group [facility] Teaching Service

## 2024-02-10 NOTE — TOC Initial Note (Addendum)
 Transition of Care [facility]) - Initial/Assessment Note    Patient Details  Name: Judith [name] MRN: [id number] Date of Birth: 21-Apr-1941  [facility]) CM/SW Contact:    Delilah Shan, LCSWA Phone Number: 02/10/2024, 2:38 PM  Clinical Narrative:                  CSW received consult for possible SNF placement at time of discharge. Due to patients current orientation CSW Lvm for patients daughter [name]. CSW awaiting call back to discuss PT recommendations. CSW to continue to follow and assist with discharge planning needs.     Update-CSW received call back from patients daughter [name]. CSW spoke with patients daughter regarding PT recommendation of SNF placement at time of discharge. Patients daughter expressed understanding of PT recommendation and is agreeable to SNF placement at time of discharge. Patients daughter reports patient comes from [facility] short term.Patients daughter reports preference for patient to return to [facility] . CSW discussed insurance authorization process.No further questions reported at this time. CSW to continue to follow and assist with discharge planning needs.   Update- CSW started insurance authorization for patient. Auth ID# [id number]. Insurance authorization currently pending. Star with [facility] confirmed SNF bed for patient.  Update- [facility] confirmed patient can dc over today if medically ready. CSW informed MD.     Patient Goals and CMS Choice            Expected Discharge Plan and Services                                              Prior Living Arrangements/Services                       Activities of Daily Living   ADL Screening (condition at time of admission) Independently performs ADLs?: Yes (appropriate for developmental age) Is the patient deaf or have difficulty hearing?: No Does the patient have difficulty seeing, even when wearing glasses/contacts?: No Does the patient have  difficulty concentrating, remembering, or making decisions?: No  Permission Sought/Granted                  Emotional Assessment              Admission diagnosis:  Hyperglycemia [R73.9] Patient Active Problem List   Diagnosis Date Noted   Hyperglycemia 02/07/2024   Elevated troponin  Demand Ischemia 02/07/2024   Demand ischemia (HCC) 02/07/2024   Acute cystitis without hematuria 02/03/2024   Generalized weakness 02/02/2024   Acute cystitis with hematuria 01/30/2024   Delirium 01/29/2024   Protein-calorie malnutrition, severe 01/27/2024   Fall 01/25/2024   Esophageal abnormality 01/25/2024   Knee abrasion 01/25/2024   Frequent falls 01/25/2024   Goals of care, counseling/discussion 12/05/2023   Depressed mood 11/12/2023   Suicidal ideation 10/29/2023   Pulmonary nodule 06/17/2023   Pacemaker battery depletion 04/25/2023   Urinary incontinence 08/22/2022   Sleep apnea    Hyperlipidemia    Polycythemia    Unspecified inflammatory spondylopathy, cervical region (HCC) 11/15/2020   Multilevel degenerative disc disease 04/03/2018   Acute blood loss anemia 01/17/2018   Unintentional weight loss 01/17/2018   S/P partial gastrectomy    Peripheral neuropathy    Benign essential HTN    Controlled type 2 diabetes mellitus with diabetic neuropathy, with long-term current  use of insulin (HCC)    Depression    Presence of cardiac pacemaker    Gout    Cervical spine arthritis 04/05/2016   Long-term insulin use in type 2 diabetes (HCC) 04/05/2016   PCP:  Darral Dash, DO Pharmacy:   [facility] [place], [place] - [address] [place] [address] Phone: [phone] Fax: [phone]  [id number] [address] [facility] [address] [place] [address] Phone: [phone] Fax: [phone]  [facility] [address] [place] [address] Phone: [phone]  Fax: [phone]     Social Drivers of Health (SDOH) Social History: SDOH Screenings   Food Insecurity: No Food Insecurity (02/07/2024)  Housing: Low Risk  (02/07/2024)  Transportation Needs: No Transportation Needs (02/07/2024)  Utilities: Not At Risk (02/07/2024)  Alcohol Screen: Low Risk  (09/27/2023)  Depression (PHQ2-9): Low Risk  (12/02/2023)  Financial Resource Strain: Low Risk  (10/19/2023)  Physical Activity: Unknown (10/19/2023)  Recent Concern: Physical Activity - Inactive (09/27/2023)  Social Connections: Socially Isolated (02/07/2024)  Stress: Patient Declined (10/19/2023)  Tobacco Use: Low Risk  (02/06/2024)  Health Literacy: Adequate Health Literacy (09/27/2023)   SDOH Interventions:     Readmission Risk Interventions     No data to display

## 2024-02-10 NOTE — [place] FL2 (Signed)
 [place] MEDICAID FL2 LEVEL OF CARE FORM     IDENTIFICATION  Patient Name: Judith [name] Birthdate: 07/12/1941 Sex: female Admission Date (Current Location): 02/06/2024  [facility] [place] Number:  Producer, television/film/video and Address:  [facility], [address] [place], [place] [address]      Provider Number: [id number]  Attending Physician Name and Address:  Caro Laroche, DO  Relative Name and Phone Number:  [name] (daughter) [phone]    Current Level of Care: Hospital Recommended Level of Care: Skilled Nursing Facility Prior Approval Number:    Date Approved/Denied:   PASRR Number: [id number]  Discharge Plan: SNF    Current Diagnoses: Patient Active Problem List   Diagnosis Date Noted   Hyperglycemia 02/07/2024   Elevated troponin  Demand Ischemia 02/07/2024   Demand ischemia (HCC) 02/07/2024   Acute cystitis without hematuria 02/03/2024   Generalized weakness 02/02/2024   Acute cystitis with hematuria 01/30/2024   Delirium 01/29/2024   Protein-calorie malnutrition, severe 01/27/2024   Fall 01/25/2024   Esophageal abnormality 01/25/2024   Knee abrasion 01/25/2024   Frequent falls 01/25/2024   Goals of care, counseling/discussion 12/05/2023   Depressed mood 11/12/2023   Suicidal ideation 10/29/2023   Pulmonary nodule 06/17/2023   Pacemaker battery depletion 04/25/2023   Urinary incontinence 08/22/2022   Sleep apnea    Hyperlipidemia    Polycythemia    Unspecified inflammatory spondylopathy, cervical region (HCC) 11/15/2020   Multilevel degenerative disc disease 04/03/2018   Acute blood loss anemia 01/17/2018   Unintentional weight loss 01/17/2018   S/P partial gastrectomy    Peripheral neuropathy    Benign essential HTN    Controlled type 2 diabetes mellitus with diabetic neuropathy, with long-term current use of insulin (HCC)    Depression    Presence of cardiac pacemaker    Gout    Cervical spine arthritis  04/05/2016   Long-term insulin use in type 2 diabetes (HCC) 04/05/2016    Orientation RESPIRATION BLADDER Height & Weight     Self, Place  Normal Incontinent Weight: 105 lb 2.6 oz (47.7 kg) Height:  5\' 3"  (160 cm)  BEHAVIORAL SYMPTOMS/MOOD NEUROLOGICAL BOWEL NUTRITION STATUS      Incontinent Diet (Please see discharge summary)  AMBULATORY STATUS COMMUNICATION OF NEEDS Skin   Extensive Assist Verbally Other (Comment) (Abrasion,Knee,R,Foam,Ecchymosis,Arm,Bil.,Wound/Incision LDAs,PI sacrum stage 2,PI heel,L,stage 2)                       Personal Care Assistance Level of Assistance  Bathing, Feeding, Dressing Bathing Assistance: Limited assistance Feeding assistance: Limited assistance Dressing Assistance: Limited assistance     Functional Limitations Info  Sight, Hearing, Speech Sight Info: Adequate Hearing Info: Adequate Speech Info: Adequate    SPECIAL CARE FACTORS FREQUENCY  PT (By licensed PT), OT (By licensed OT)     PT Frequency: 5x min weekly OT Frequency: 5x min weekly            Contractures Contractures Info: Not present    Additional Factors Info  Code Status, Allergies, Insulin Sliding Scale, Psychotropic Code Status Info: DNR Allergies Info: Asa (aspirin),Motrin (ibuprofen),Cipro (ciprofloxacin Hcl),Sulfa Antibiotics Psychotropic Info: mirtazapine (REMERON) tablet 7.5 mg daily at bedtime,sertraline (ZOLOFT) tablet 25 mg daily Insulin Sliding Scale Info: insulin aspart (novoLOG) injection 0-9 Units 3 times daily with meals,insulin glargine-yfgn (SEMGLEE) injection 10 Units daily at bedtime       Current Medications (02/10/2024):  This is the current hospital active medication list  Current Facility-Administered Medications  Medication Dose Route Frequency Provider Last Rate Last Admin   acetaminophen (TYLENOL) tablet 650 mg  650 mg Oral Q6H PRN Nolberto Hanlon, MD   650 mg at 02/10/24 0915   Or   acetaminophen (TYLENOL) suppository 650 mg  650 mg Rectal  Q6H PRN Nolberto Hanlon, MD       carvedilol (COREG) tablet 6.25 mg  6.25 mg Oral BID WC Nolberto Hanlon, MD   6.25 mg at 02/10/24 0915   Chlorhexidine Gluconate Cloth 2 % PADS 6 each  6 each Topical Daily Caro Laroche, DO   6 each at 02/10/24 0915   dextrose 50 % solution 0-50 mL  0-50 mL Intravenous PRN Nolberto Hanlon, MD   50 mL at 02/07/24 0859   diclofenac Sodium (VOLTAREN) 1 % topical gel 2 g  2 g Topical QID PRN Baloch, Mahnoor, MD       enoxaparin (LOVENOX) injection 40 mg  40 mg Subcutaneous Q24H Nolberto Hanlon, MD   40 mg at 02/10/24 0916   famotidine (PEPCID) tablet 20 mg  20 mg Oral Sherene Sires, MD   20 mg at 02/09/24 2136   feeding supplement (ENSURE ENLIVE / ENSURE PLUS) liquid 237 mL  237 mL Oral BID BM Caro Laroche, DO   237 mL at 02/10/24 1150   gabapentin (NEURONTIN) capsule 400 mg  400 mg Oral QHS Nolberto Hanlon, MD   400 mg at 02/09/24 2125   insulin aspart (novoLOG) injection 0-9 Units  0-9 Units Subcutaneous TID WC Vonna Drafts, MD   5 Units at 02/10/24 1215   insulin glargine-yfgn (SEMGLEE) injection 10 Units  10 Units Subcutaneous QHS Vonna Drafts, MD       lidocaine (LIDODERM) 5 % 2 patch  2 patch Transdermal Q24H Elberta Fortis, MD   2 patch at 02/10/24 1216   methenamine (MANDELAMINE) tablet 1 g  1 g Oral BID Nolberto Hanlon, MD   1 g at 02/10/24 0915   metoCLOPramide (REGLAN) tablet 10 mg  10 mg Oral TID AC & HS Nolberto Hanlon, MD   10 mg at 02/10/24 1216   mirtazapine (REMERON) tablet 7.5 mg  7.5 mg Oral QHS Nolberto Hanlon, MD   7.5 mg at 02/09/24 2127   oxyCODONE (Oxy IR/ROXICODONE) immediate release tablet 2.5 mg  2.5 mg Oral BID PRN Nolberto Hanlon, MD   2.5 mg at 02/10/24 1424   polyethylene glycol (MIRALAX / GLYCOLAX) packet 17 g  17 g Oral Daily Nolberto Hanlon, MD   17 g at 02/10/24 0916   rosuvastatin (CRESTOR) tablet 10 mg  10 mg Oral Daily Nolberto Hanlon, MD   10 mg at 02/10/24 0915   sertraline (ZOLOFT) tablet 25 mg  25 mg Oral Daily Nolberto Hanlon, MD   25 mg at 02/10/24 0915    sodium chloride flush (NS) 0.9 % injection 10-40 mL  10-40 mL Intracatheter Q12H Caro Laroche, DO   20 mL at 02/10/24 0915   sodium chloride flush (NS) 0.9 % injection 10-40 mL  10-40 mL Intracatheter PRN Caro Laroche, DO         Discharge Medications: Please see discharge summary for a list of discharge medications.  Relevant Imaging Results:  Relevant Lab Results:   Additional Information [id number]  Delilah Shan, LCSWA

## 2024-02-11 DIAGNOSIS — Z7189 Other specified counseling: Secondary | ICD-10-CM

## 2024-02-11 DIAGNOSIS — Z515 Encounter for palliative care: Secondary | ICD-10-CM

## 2024-02-11 DIAGNOSIS — R739 Hyperglycemia, unspecified: Secondary | ICD-10-CM | POA: Diagnosis not present

## 2024-02-11 LAB — GLUCOSE, CAPILLARY
Glucose-Capillary: 217 mg/dL — ABNORMAL HIGH (ref 70–99)
Glucose-Capillary: 235 mg/dL — ABNORMAL HIGH (ref 70–99)
Glucose-Capillary: 312 mg/dL — ABNORMAL HIGH (ref 70–99)

## 2024-02-11 MED ORDER — INSULIN GLARGINE 100 UNIT/ML ~~LOC~~ SOLN: 12.0000 [IU] | Freq: Every day | SUBCUTANEOUS | Status: DC

## 2024-02-11 MED ORDER — ENOXAPARIN SODIUM 30 MG/0.3ML IJ SOSY
30.0000 mg | PREFILLED_SYRINGE | INTRAMUSCULAR | Status: DC
  Administered 2024-02-11: 30 mg via SUBCUTANEOUS
  Filled 2024-02-11: qty 0.3

## 2024-02-11 NOTE — Consult Note (Signed)
 Palliative Medicine Inpatient Consult Note  Consulting Provider:  Elberta Fortis, MD    Reason for consult:   Palliative Care Consult Services Palliative Medicine Consult  Reason for Consult? Progressive decline over time, family would like discussion about options   02/11/2024  HPI:  Per intake H&P --> Judith [name] is a 83 y.o. female with medical history significant of documented chronic cognitive communication deficit.  Patient further has history of type 2 diabetes mellitus with diabetic neuropathy.  Tesha came in due to elevated glucose.   The Palliative care team has been asked to get involved to further address goals of care.   Clinical Assessment/Goals of Care:  *Please note that this is a verbal dictation therefore any spelling or grammatical errors are due to the "Dragon Medical One" system interpretation.  I have reviewed medical records including EPIC notes, labs and imaging, received report from bedside RN, assessed the patient who is lying in bed. She shares that her legs hurt her.     I called patients daughter, [name] to further discuss diagnosis prognosis, GOC, EOL wishes, disposition and options.   I introduced Palliative Medicine as specialized medical care for people living with serious illness. It focuses on providing relief from the symptoms and stress of a serious illness. The goal is to improve quality of life for both the patient and the family.  Medical History Review and Understanding:  A review of patient's past medical history inclusive of type 2 diabetes with associated neuropathy, arthritis, falls, gastroparesis, type 2 diabetes, hyperlipidemia, hypertension, and sleep apnea -not on CPAP was completed  Social History:  Judith [name] is a long-term resident at [facility] facility in [place].  She is a widow.  She has 2 sons and 1 daughter.  She had in various things throughout her career.  She is a woman of the [employer].  Functional and Nutritional State:  [name] requires help with all bADLs.  Her daughter shares that sometimes she will eat well other times she will not she has an extremely variable appetite.  Palliative Symptoms:  Foot pain in the setting of neuropathy for which Ardel is on gabapentin  Neck and back pain in the setting of arthritis for which she takes Tylenol and oxycodone as needed.  History of known depression. On sertraline.  Adult FTT - eating well here overall. On mirtazapine.   Advance Directives:  A detailed discussion was had today regarding advanced directives.  A copy of advance directives can be found in Vynca.  Code Status:  Concepts specific to code status, artifical feeding and hydration, continued IV antibiotics and rehospitalization was had.  The difference between a aggressive medical intervention path  and a palliative comfort care path for this patient at this time was had.   [name] is an established DO NOT INTUBATE DO NOT RESUSCITATE CODE STATUS.  Discussion:  [name] shares with me that Judith [name] was functioning exceptionally well up until this past October when she started to complain more of leg and back pain.  [name] notes that her eating and drinking has been more variable during that timeframe [name] would eat and then go days without eating and then throwing them.  [name] motivation has been lacking as well.   We discussed that a few days ago [name] was concerned that Judith [name] given her overall health declines may not do well though she feels more encouraged as of presently.  The goal at this time is for her to is to go to  [facility] for additional rehabilitation and strengthening.  [name] is followed by Janice Coffin on an outpatient basis which patient's daughter would like to be continued at [facility].  Discussed the importance of continued conversation with family and their  medical providers regarding overall plan of care and treatment options,  ensuring decisions are within the context of the patients values and GOCs.  Decision Maker: [name],[name] (Daughter): [phone] (Mobile)   SUMMARY OF RECOMMENDATIONS   DNAR/DNI  Goals for patient to transition to [facility] for strengthening then transition back to [facility]  Patient is established with [facility] support - daughter would like this continued  PMT will continue to follow along  Code Status/Advance Care Planning: DNAR/DNI   Symptom Management:  Chronic Pain of back: - Recommend Tylenol 1g PO TID - Continue oxycodone 2.5mg  PO BID PRN - Continue Voltaren gel - Continue Lidoderm patches  Neuropathic Pain: - Continue gabapentin 400mg  PO at bedtime  Adult FTT: - Continue Remeron 7.5mg  PO  --> caution with zoloft for hyponatremia  Depression: - Continue sertraline 25mg  PO Qday  Bowel Ppx: - Continue miralx 17gm daily  Palliative Prophylaxis:  Aspiration, Bowel Regimen, Delirium Protocol, Frequent Pain Assessment, Oral Care, Palliative Wound Care, and Turn Reposition  Additional Recommendations (Limitations, Scope, Preferences): Continue present care  Psycho-social/Spiritual:  Desire for further Chaplaincy support: Yes Additional Recommendations: Education on chronic disease.   Prognosis: Has had two admissions and five ER visits within six months. Multiple chronic diseases placing Judith [name] at an elevated 12 month mortality risk.   Discharge Planning: Discharge to [facility] once medically optimized.   Vitals:   02/10/24 2021 02/11/24 0714  BP: (!) 152/59 126/87  Pulse: 80 71  Resp: 20 16  Temp: 97.7 F (36.5 C) 98 F (36.7 C)  SpO2: 100% 100%    Intake/Output Summary (Last 24 hours) at 02/11/2024 8295 Last data filed at 02/11/2024 0700 Gross per 24 hour  Intake 240 ml  Output 1765 ml  Net -1525 ml   Last Weight  Most recent update: 02/07/2024  4:05 PM    Weight  47.7 kg (105 lb 2.6 oz)            Gen:  Elderly Caucasian F  chronically ill appearing HEENT: moist mucous membranes CV: Regular rate and rhythm  PULM:  On RA, breathing is even and nonlabored ABD: soft/nontender  EXT: No edema  Neuro: Alert and oriented x2  PPS: 30-40%   This conversation/these recommendations were discussed with patient primary care team, Dr. Georg Ruddle  Billing based on MDM: High ______________________________________________________ Lamarr Lulas [facility] Health Palliative Medicine Team Team Cell Phone: [phone] Please utilize secure chat with additional questions, if there is no response within 30 minutes please call the above phone number  Palliative Medicine Team providers are available by phone from 7am to 7pm daily and can be reached through the team cell phone.  Should this patient require assistance outside of these hours, please call the patient's attending physician.

## 2024-02-11 NOTE — Discharge Summary (Signed)
 Family Medicine Teaching [facility] Discharge Summary  Patient name: Judith [name] Medical record number: [id number] Date of birth: 06-28-1941 Age: 83 y.o. Gender: female Date of Admission: 02/06/2024  Date of Discharge: 02/11/24  Admitting Physician: Nolberto Hanlon, MD  Primary Care Provider: Darral Dash, DO Consultants: Palliative Care   Indication for Hospitalization: Hypergylcemia  Discharge Diagnoses/Problem List:  Principal Problem for Admission: Hyperglycemia Other Problems addressed during stay:  Principal Problem:   Hyperglycemia Active Problems:   Long-term insulin use in type 2 diabetes (HCC)   Goals of care, counseling/discussion   Elevated troponin  Demand Ischemia   Demand ischemia [facility])   Brief Hospital Course:  Judith [name] is a 83 y.o. female with past medical hx of T2DM, multilevel DDD, peripheral neuropathy, depression, unintentional weight loss, and complete heart block s/p pacemaker who was admitted to the [facility] Medicine Teaching Service at [facility] for hyperglycemia and possible HHS. Hospital course is outlined below by problem.   Hyperglycemia Patient presented to the emergency department from her facility with a BG of 746.  Patient was recently admitted to the service for a fall and discharged to facility without home insulin.  Endotool was utilized and she received 70.5 units Toradol with rapid response to normal CBGs within 4 hours.  Sugars remained stable after discontinuing Endotool, started sensitive SSI. Patient continued to have sugars in the high 200s/300s on just SSI. Discussed home regimen with family who confirmed patient was getting 20U lantus daily with humalog meal time sliding scale and bedtime coverage that was discontinued on 12/02/23. Patient was started on 10U LAI while hospitalized with decrease in sugars. Patient was discharged home with instruction to restart at lower dose of 12U lantus.   Elevated Troponins  Demand  Ischemia Elevated to 104 on admission and then up trended, likely in the setting of demand ischemia secondary to hyperglycemia.  EKG demonstrated ventricularly paced rhythm.  Cardiology was consulted and recommended continuing Crestor otherwise had no further medical intervention at this time.  Goals of Care Patients family interested in discussing goals of care. Consulted palliative who met with family while admitted.  Other chronic conditions were medically managed with home medications and formulary alternatives as necessary (HTN, HLD, peripheral neuropathy, depression)  PCP Follow-up Recommendations: Need for adjusting insulin regimen depending on CBGs outpatient Held coreg in setting of normotension during admission, consider restarting if BP elevated Patient had a incidental 2 mm left upper pulmonary nodule on 6/24. Patient will need repeat low dose CT in 2-3 months.  Disposition: [facility]   Discharge Condition: Stable   Discharge Exam:  Vitals:   02/11/24 0714 02/11/24 0748  BP: 126/87   Pulse: 71 71  Resp: 16   Temp: 98 F (36.7 C) 98 F (36.7 C)  SpO2: 100% 100%   General: A&O, NAD, chronically ill appearing  HEENT: No sign of trauma, EOM grossly intact Cardiac: RRR, no m/r/g Respiratory: CTAB, normal WOB, no w/c/r GI: Soft, NTTP, non-distended   Significant Procedures: None  Significant Labs and Imaging:  Recent Labs  Lab 02/10/24 0430  WBC 8.9  HGB 9.4*  HCT 28.5*  PLT 170   No results for input(s): "NA", "K", "CL", "CO2", "GLUCOSE", "BUN", "CREATININE", "CALCIUM", "MG", "PHOS", "ALKPHOS", "AST", "ALT", "ALBUMIN", "PROTEIN" in the last 48 hours.  Pertinent Imaging   Results/Tests Pending at Time of Discharge: None  Discharge Medications:  Allergies as of 02/11/2024       Reactions   Asa [aspirin] Other (See Comments)  Causes ulcers   Motrin [ibuprofen] Other (See Comments)   GI intolerance   Cipro [ciprofloxacin Hcl] Rash   Ok to use eye drop  formulation   Sulfa Antibiotics Other (See Comments)   GI issues        Medication List     PAUSE taking these medications    carvedilol 6.25 MG tablet Wait to take this until your doctor or other care provider tells you to start again. Commonly known as: COREG Take 1 tablet (6.25 mg total) by mouth 2 (two) times daily with a meal.       TAKE these medications    diclofenac Sodium 1 % Gel Commonly known as: Voltaren Apply 2 g topically 4 (four) times daily as needed (pain).   famotidine 20 MG tablet Commonly known as: PEPCID Take 1 tablet (20 mg total) by mouth at bedtime. What changed: when to take this   gabapentin 400 MG capsule Commonly known as: NEURONTIN Take 400 mg by mouth at bedtime.   insulin glargine 100 UNIT/ML injection Commonly known as: LANTUS Inject 0.12 mLs (12 Units total) into the skin daily. What changed: how much to take   insulin lispro 100 UNIT/ML injection Commonly known as: HUMALOG Inject 0-12 Units into the skin 4 (four) times daily -  with meals and at bedtime.   methenamine 1 g tablet Commonly known as: HIPREX Take 1 g by mouth 2 (two) times daily.   metoCLOPramide 10 MG tablet Commonly known as: Reglan Take 1 tablet (10 mg total) by mouth 4 (four) times daily -  before meals and at bedtime. Take 1 tablet 10 mg by mouth 3 times/day before meals and at bedtime prn   mirtazapine 7.5 MG tablet Commonly known as: REMERON Take 1 tablet (7.5 mg total) by mouth at bedtime.   oxyCODONE 5 MG immediate release tablet Commonly known as: Roxicodone Take 0.5 tablets (2.5 mg total) by mouth 2 (two) times daily as needed for severe pain (pain score 7-10).   polyethylene glycol powder 17 GM/SCOOP powder Commonly known as: GLYCOLAX/MIRALAX Dissolve 1 capful  (17 g) in water and take by mouth daily.   rosuvastatin 10 MG tablet Commonly known as: Crestor Take 1 tablet (10 mg total) by mouth daily.   sertraline 25 MG tablet Commonly known  as: ZOLOFT Take 1 tablet (25 mg total) by mouth daily.        Discharge Instructions: Please refer to Patient Instructions section of EMR for full details.  Patient was counseled important signs and symptoms that should prompt return to medical care, changes in medications, dietary instructions, activity restrictions, and follow up appointments.   Follow-Up Appointments:  Follow-up Information     [facility], Roe Rutherford, PA Follow up.   Specialties: Cardiology, Radiology Why: [facility] location - cardiology follow-up with PA Micah Flesher on Wednesday Mar 11, 2024 3:10 PM. Arrive 15 minutes prior to appointment to check in. Contact information: [address] [place] [address] [phone]               [facility] 2/25 at 10:30 AM  Penne Lash, MD 02/11/2024, 10:29 AM PGY-1, [facility] Family Medicine

## 2024-02-11 NOTE — TOC Progression Note (Signed)
 Transition of Care [facility]) - Progression Note    Patient Details  Name: Judith [name] MRN: [id number] Date of Birth: 01/12/1941  Transition of Care [facility]) CM/SW Contact  Delilah Shan, LCSWA Phone Number: 02/11/2024, 10:14 AM  Clinical Narrative:     Patient has SNF bed at [facility]. Insurance authorization approved Plan Auth ID# [id number] Auth ID# [id number] . Insurance authorization approved from 2/18-2/20. CSW informed MD.   Expected Discharge Plan: Skilled Nursing Facility Barriers to Discharge: Continued Medical Work up  Expected Discharge Plan and Services In-house Referral: Clinical Social Work     Living arrangements for the past 2 months: Assisted Living Facility                                       Social Determinants of Health (SDOH) Interventions SDOH Screenings   Food Insecurity: No Food Insecurity (02/07/2024)  Housing: Low Risk  (02/07/2024)  Transportation Needs: No Transportation Needs (02/07/2024)  Utilities: Not At Risk (02/07/2024)  Alcohol Screen: Low Risk  (09/27/2023)  Depression (PHQ2-9): Low Risk  (12/02/2023)  Financial Resource Strain: Low Risk  (10/19/2023)  Physical Activity: Unknown (10/19/2023)  Recent Concern: Physical Activity - Inactive (09/27/2023)  Social Connections: Socially Isolated (02/07/2024)  Stress: Patient Declined (10/19/2023)  Tobacco Use: Low Risk  (02/06/2024)  Health Literacy: Adequate Health Literacy (09/27/2023)    Readmission Risk Interventions     No data to display

## 2024-02-11 NOTE — TOC Transition Note (Signed)
 Transition of Care [facility]) - Discharge Note   Patient Details  Name: Judith [name] MRN: [id number] Date of Birth: November 30, 1941  Transition of Care [facility]) CM/SW Contact:  Delilah Shan, LCSWA Phone Number: 02/11/2024, 11:41 AM   Clinical Narrative:     Patient will DC to: [facility]  Anticipated DC date: 02/11/2024  Family notified: [name]   Transport by: Sharin Mons   ?  Per MD patient ready for DC to [facility] . Marcelino Duster with palliative wanted CSW check to see if [facility] able to follow patient at [facility] place. CSW spoke with patients daughter [name] who confirmed she would like for [facility] palliative services to follow patient at [facility].CSW spoke with [facility] with [facility] who was unsure if able to follow patient at facility, but if unable [facility] will follow patient when she returns to [place].[facility] with [facility] confirmed they do not have contract with [facility]. CSW informed patients daughter and Marcelino Duster with palliative.  CSW informed Cary with [facility]. Cary confirmed she will follow patient at [facility] when she returns from rehab.RN, patient, patient's family, and facility notified of DC. Discharge Summary sent to facility. RN given number for report [phone] RM# [id number]. DC packet on chart. DNR signed by MD attached to patients DC packet. Ambulance transport requested for patient.  CSW signing off.   Final next level of care: Skilled Nursing Facility Barriers to Discharge: Continued Medical Work up   Patient Goals and CMS Choice Patient states their goals for this hospitalization and ongoing recovery are:: SNF   Choice offered to / list presented to : Adult Children      Discharge Placement                       Discharge Plan and Services Additional resources added to the After Visit Summary for   In-house Referral: Clinical Social Work                                   Social Drivers of Health (SDOH) Interventions SDOH Screenings   Food  Insecurity: No Food Insecurity (02/07/2024)  Housing: Low Risk  (02/07/2024)  Transportation Needs: No Transportation Needs (02/07/2024)  Utilities: Not At Risk (02/07/2024)  Alcohol Screen: Low Risk  (09/27/2023)  Depression (PHQ2-9): Low Risk  (12/02/2023)  Financial Resource Strain: Low Risk  (10/19/2023)  Physical Activity: Unknown (10/19/2023)  Recent Concern: Physical Activity - Inactive (09/27/2023)  Social Connections: Socially Isolated (02/07/2024)  Stress: Patient Declined (10/19/2023)  Tobacco Use: Low Risk  (02/06/2024)  Health Literacy: Adequate Health Literacy (09/27/2023)     Readmission Risk Interventions     No data to display

## 2024-02-11 NOTE — Progress Notes (Signed)
 PICC Removal Note: PICC line removed from RUE. PICC catheter tip visualized and intact. Pressure held until hemostasis achieved. Pressure dressing applied. No redness, ecchymosis, edema, swelling, or drainage noted at site. Instructions provided on post PICC discharge care, including followup notification instructions.  Bed rest until 1315.

## 2024-02-12 LAB — CULTURE, BLOOD (ROUTINE X 2)
Culture: NO GROWTH
Culture: NO GROWTH
Special Requests: ADEQUATE
Special Requests: ADEQUATE

## 2024-02-18 ENCOUNTER — Inpatient Hospital Stay: Payer: Self-pay | Admitting: Student

## 2024-02-21 ENCOUNTER — Emergency Department (HOSPITAL_COMMUNITY): Payer: 59

## 2024-02-21 ENCOUNTER — Inpatient Hospital Stay (HOSPITAL_COMMUNITY)
Admission: EM | Admit: 2024-02-21 | Discharge: 2024-03-03 | DRG: 872 | Disposition: A | Discharge status: Skilled Nursing Facility | Payer: 59 | Source: Skilled Nursing Facility | Attending: Family Medicine | Admitting: Family Medicine

## 2024-02-21 ENCOUNTER — Other Ambulatory Visit: Payer: Self-pay

## 2024-02-21 ENCOUNTER — Encounter (HOSPITAL_COMMUNITY): Payer: Self-pay | Admitting: Radiology

## 2024-02-21 DIAGNOSIS — I2510 Atherosclerotic heart disease of native coronary artery without angina pectoris: Secondary | ICD-10-CM | POA: Diagnosis present

## 2024-02-21 DIAGNOSIS — Z950 Presence of cardiac pacemaker: Secondary | ICD-10-CM

## 2024-02-21 DIAGNOSIS — F0390 Unspecified dementia without behavioral disturbance: Secondary | ICD-10-CM | POA: Diagnosis not present

## 2024-02-21 DIAGNOSIS — Z9049 Acquired absence of other specified parts of digestive tract: Secondary | ICD-10-CM

## 2024-02-21 DIAGNOSIS — I442 Atrioventricular block, complete: Secondary | ICD-10-CM | POA: Diagnosis present

## 2024-02-21 DIAGNOSIS — G309 Alzheimer's disease, unspecified: Secondary | ICD-10-CM | POA: Diagnosis not present

## 2024-02-21 DIAGNOSIS — R5383 Other fatigue: Secondary | ICD-10-CM | POA: Diagnosis present

## 2024-02-21 DIAGNOSIS — M79605 Pain in left leg: Secondary | ICD-10-CM | POA: Diagnosis not present

## 2024-02-21 DIAGNOSIS — M79606 Pain in leg, unspecified: Secondary | ICD-10-CM | POA: Diagnosis not present

## 2024-02-21 DIAGNOSIS — Z794 Long term (current) use of insulin: Secondary | ICD-10-CM | POA: Diagnosis not present

## 2024-02-21 DIAGNOSIS — Z903 Acquired absence of stomach [part of]: Secondary | ICD-10-CM | POA: Diagnosis not present

## 2024-02-21 DIAGNOSIS — I10 Essential (primary) hypertension: Secondary | ICD-10-CM | POA: Diagnosis present

## 2024-02-21 DIAGNOSIS — D75839 Thrombocytosis, unspecified: Secondary | ICD-10-CM | POA: Diagnosis present

## 2024-02-21 DIAGNOSIS — E876 Hypokalemia: Secondary | ICD-10-CM | POA: Diagnosis not present

## 2024-02-21 DIAGNOSIS — F0393 Unspecified dementia, unspecified severity, with mood disturbance: Secondary | ICD-10-CM | POA: Diagnosis present

## 2024-02-21 DIAGNOSIS — Z1152 Encounter for screening for COVID-19: Secondary | ICD-10-CM | POA: Diagnosis not present

## 2024-02-21 DIAGNOSIS — K3184 Gastroparesis: Secondary | ICD-10-CM | POA: Diagnosis present

## 2024-02-21 DIAGNOSIS — Z79899 Other long term (current) drug therapy: Secondary | ICD-10-CM | POA: Diagnosis not present

## 2024-02-21 DIAGNOSIS — Z789 Other specified health status: Secondary | ICD-10-CM

## 2024-02-21 DIAGNOSIS — R911 Solitary pulmonary nodule: Secondary | ICD-10-CM | POA: Diagnosis present

## 2024-02-21 DIAGNOSIS — R4182 Altered mental status, unspecified: Secondary | ICD-10-CM | POA: Diagnosis present

## 2024-02-21 DIAGNOSIS — M79604 Pain in right leg: Secondary | ICD-10-CM | POA: Diagnosis not present

## 2024-02-21 DIAGNOSIS — R1310 Dysphagia, unspecified: Secondary | ICD-10-CM | POA: Diagnosis present

## 2024-02-21 DIAGNOSIS — E1140 Type 2 diabetes mellitus with diabetic neuropathy, unspecified: Secondary | ICD-10-CM

## 2024-02-21 DIAGNOSIS — R531 Weakness: Secondary | ICD-10-CM | POA: Diagnosis not present

## 2024-02-21 DIAGNOSIS — Z881 Allergy status to other antibiotic agents status: Secondary | ICD-10-CM

## 2024-02-21 DIAGNOSIS — Z66 Do not resuscitate: Secondary | ICD-10-CM | POA: Diagnosis present

## 2024-02-21 DIAGNOSIS — Z882 Allergy status to sulfonamides status: Secondary | ICD-10-CM

## 2024-02-21 DIAGNOSIS — E1143 Type 2 diabetes mellitus with diabetic autonomic (poly)neuropathy: Secondary | ICD-10-CM | POA: Diagnosis present

## 2024-02-21 DIAGNOSIS — F32A Depression, unspecified: Secondary | ICD-10-CM | POA: Diagnosis present

## 2024-02-21 DIAGNOSIS — A419 Sepsis, unspecified organism: Principal | ICD-10-CM

## 2024-02-21 DIAGNOSIS — K5900 Constipation, unspecified: Secondary | ICD-10-CM | POA: Diagnosis not present

## 2024-02-21 DIAGNOSIS — Z886 Allergy status to analgesic agent status: Secondary | ICD-10-CM

## 2024-02-21 DIAGNOSIS — R410 Disorientation, unspecified: Secondary | ICD-10-CM | POA: Diagnosis present

## 2024-02-21 DIAGNOSIS — E785 Hyperlipidemia, unspecified: Secondary | ICD-10-CM | POA: Diagnosis present

## 2024-02-21 DIAGNOSIS — F02B3 Dementia in other diseases classified elsewhere, moderate, with mood disturbance: Secondary | ICD-10-CM | POA: Diagnosis not present

## 2024-02-21 DIAGNOSIS — Z951 Presence of aortocoronary bypass graft: Secondary | ICD-10-CM

## 2024-02-21 LAB — COMPREHENSIVE METABOLIC PANEL
ALT: 12 U/L (ref 0–44)
AST: 28 U/L (ref 15–41)
Albumin: 2.5 g/dL — ABNORMAL LOW (ref 3.5–5.0)
Alkaline Phosphatase: 41 U/L (ref 38–126)
Anion gap: 14 (ref 5–15)
BUN: 12 mg/dL (ref 8–23)
CO2: 26 mmol/L (ref 22–32)
Calcium: 8.9 mg/dL (ref 8.9–10.3)
Chloride: 98 mmol/L (ref 98–111)
Creatinine, Ser: 0.5 mg/dL (ref 0.44–1.00)
GFR, Estimated: >60 mL/min (ref >60)
Glucose, Bld: 124 mg/dL — ABNORMAL HIGH (ref 70–99)
Potassium: 3.3 mmol/L — ABNORMAL LOW (ref 3.5–5.1)
Sodium: 138 mmol/L (ref 135–145)
Total Bilirubin: 0.5 mg/dL (ref 0.0–1.2)
Total Protein: 6.2 g/dL — ABNORMAL LOW (ref 6.5–8.1)

## 2024-02-21 LAB — CBC WITH DIFFERENTIAL/PLATELET
Abs Immature Granulocytes: 0.06 10*3/uL (ref 0.00–0.07)
Basophils Absolute: 0.1 10*3/uL (ref 0.0–0.1)
Basophils Relative: 1 %
Eosinophils Absolute: 0.2 10*3/uL (ref 0.0–0.5)
Eosinophils Relative: 1 %
HCT: 36.2 % (ref 36.0–46.0)
Hemoglobin: 11.8 g/dL — ABNORMAL LOW (ref 12.0–15.0)
Immature Granulocytes: 1 %
Lymphocytes Relative: 14 %
Lymphs Abs: 1.8 10*3/uL (ref 0.7–4.0)
MCH: 32.3 pg (ref 26.0–34.0)
MCHC: 32.6 g/dL (ref 30.0–36.0)
MCV: 99.2 fL (ref 80.0–100.0)
Monocytes Absolute: 1.2 10*3/uL — ABNORMAL HIGH (ref 0.1–1.0)
Monocytes Relative: 9 %
Neutro Abs: 9.7 10*3/uL — ABNORMAL HIGH (ref 1.7–7.7)
Neutrophils Relative %: 74 %
Platelets: 992 10*3/uL (ref 150–400)
RBC: 3.65 MIL/uL — ABNORMAL LOW (ref 3.87–5.11)
RDW: 13.6 % (ref 11.5–15.5)
Smear Review: INCREASED
WBC: 13 10*3/uL — ABNORMAL HIGH (ref 4.0–10.5)
nRBC: 0 % (ref 0.0–0.2)

## 2024-02-21 LAB — URINALYSIS, W/ REFLEX TO CULTURE (INFECTION SUSPECTED)
Bacteria, UA: NONE SEEN
Bilirubin Urine: NEGATIVE
Glucose, UA: NEGATIVE mg/dL
Hgb urine dipstick: NEGATIVE
Ketones, ur: NEGATIVE mg/dL
Leukocytes,Ua: NEGATIVE
Nitrite: NEGATIVE
Protein, ur: NEGATIVE mg/dL
Specific Gravity, Urine: 1.005 (ref 1.005–1.030)
pH: 7 (ref 5.0–8.0)

## 2024-02-21 LAB — APTT: aPTT: 27 s (ref 24–36)

## 2024-02-21 LAB — RESP PANEL BY RT-PCR (RSV, FLU A&B, COVID)  RVPGX2
Influenza A by PCR: NEGATIVE
Influenza B by PCR: NEGATIVE
Resp Syncytial Virus by PCR: NEGATIVE
SARS Coronavirus 2 by RT PCR: NEGATIVE

## 2024-02-21 LAB — LIPASE, BLOOD: Lipase: 18 U/L (ref 11–51)

## 2024-02-21 LAB — I-STAT CG4 LACTIC ACID, ED: Lactic Acid, Venous: 1.3 mmol/L (ref 0.5–1.9)

## 2024-02-21 LAB — PROTIME-INR
INR: 1 (ref 0.8–1.2)
Prothrombin Time: 13.7 s (ref 11.4–15.2)

## 2024-02-21 MED ORDER — LACTATED RINGERS IV SOLN: INTRAVENOUS | Status: DC

## 2024-02-21 MED ORDER — LACTATED RINGERS IV BOLUS (SEPSIS)
1000.0000 mL | Freq: Once | INTRAVENOUS | Status: AC
  Administered 2024-02-21: 1000 mL via INTRAVENOUS

## 2024-02-21 MED ORDER — METRONIDAZOLE 500 MG/100ML IV SOLN
500.0000 mg | Freq: Once | INTRAVENOUS | Status: AC
  Administered 2024-02-21: 500 mg via INTRAVENOUS
  Filled 2024-02-21: qty 100

## 2024-02-21 MED ORDER — LACTATED RINGERS IV BOLUS (SEPSIS)
500.0000 mL | Freq: Once | INTRAVENOUS | Status: AC
  Administered 2024-02-21: 500 mL via INTRAVENOUS

## 2024-02-21 MED ORDER — SODIUM CHLORIDE 0.9 % IV SOLN
2.0000 g | Freq: Once | INTRAVENOUS | Status: AC
  Administered 2024-02-21: 2 g via INTRAVENOUS
  Filled 2024-02-21: qty 12.5

## 2024-02-21 MED ORDER — VANCOMYCIN HCL IN DEXTROSE 1-5 GM/200ML-% IV SOLN
1000.0000 mg | Freq: Once | INTRAVENOUS | Status: AC
  Administered 2024-02-21: 1000 mg via INTRAVENOUS
  Filled 2024-02-21: qty 200

## 2024-02-21 NOTE — ED Triage Notes (Signed)
 22g L forarm

## 2024-02-21 NOTE — H&P (Incomplete)
 Hospital Admission History and Physical Service Pager: [phone]  Patient name: OCTAVIE WESTERHOLD Medical record number: [id number] Date of Birth: [birth year] Age: 83 y.o. Gender: female  Primary Care Provider: Darral Dash, DO Consultants: None Code Status: *** which was confirmed with family if patient unable to confirm ***  Preferred Emergency Contact:  Contact Information     Name Relation Home Work Mobile   [name],[name] Daughter [phone]  [phone]      Other Contacts   None on File     Chief Complaint: AMS  Assessment and Plan: EMMELYN SCHMALE is a 83 y.o. female presenting with sepsis and AMS. Differential for this patient's presentation of this includes sepsis, stroke, UTI, pyelonephritis, PE, pneumonia, DKA.  Additionally presented with thrombocytosis.  Common causes of thrombocytosis may be anemia/blood loss related, infectious, noninfectious (malignancy, rheumatologic conditions, trauma, medication reaction), postsplenectomy.  Given presentation with fever and vital sign changes, sepsis most likely.  Urine and chest x-ray unremarkable.  In the setting of likely sepsis, normocytosis is most likely to be related to infectious cause. Assessment & Plan Sepsis (HCC) Presented febrile, tachycardic, tachypneic however fortunately hypertensive with altered mental status.  ED initiated sepsis protocol, received 1.5 L LR. -Admit to FMTS, progressive unit, attending Dr. Lum Babe -Vital signs per unit -Continue LR 150 mL/h -Continue vancomycin, cefepime, Flagyl; narrow per blood cultures -Follow-up blood cultures - Thrombocytosis Platelets 992, most likely in relation to sepsis. -Monitor CBC    Chronic and Stable Conditions: ***  FEN/GI: N.p.o. VTE Prophylaxis: Lovenox  Disposition: Progressive  History of Present Illness:  LILIAN FUHS is a 83 y.o. female presenting with altered mental status from nursing home.  In the ED, she presented febrile at 100.5,  tachycardic 107, hypertensive and reported tachypnea.   Review Of Systems: Per HPI  Pertinent Past Medical History: per chart review T2DM Depression HTN HLD Pacemaker placed for complete heart block  Peripheral neuropathy DDD Remainder reviewed in history tab.   Pertinent Past Surgical History: per chart review Biliary dilatation Cholecystectomy ERCP Partial gastrectomy Pacemaker  Remainder reviewed in history tab.   Pertinent Social History: per chart review Tobacco use: None Alcohol use: no Other Substance use: no Lives [facility] roommate at [facility]  Pertinent Family History: Mother: Diabetes Father: Diabetes, heart disease Sister: Diabetes Son: Heart disease Remainder reviewed in history tab.   Important Outpatient Medications: Voltaren 4 times daily as needed Famotidine 20 mg nightly Gabapentin 400 mg nightly Glargine 12 units daily Lispro 0 to 12 units 4 times daily with meals and at bedtime Methenamine 1 g twice daily Reglan as needed Mirtazapine 7.5 mg nightly Oxycodone 2.5 mg twice daily as needed MiraLAX Rosuvastatin 10 mg daily Sertraline 25 mg daily Remainder reviewed in medication history.   Objective: BP (!) 161/96   Pulse 95   Temp 99.5 F (37.5 C) (Oral)   Resp (!) 27   Ht 5\' 3"  (1.6 m)   Wt 47.6 kg   SpO2 99%   BMI 18.60 kg/m  Exam: General: *** Eyes: *** ENTM: *** Neck: *** Cardiovascular: *** Respiratory: *** Gastrointestinal: *** MSK: *** Derm: *** Neuro: *** Psych: ***  Labs:  CBC BMET  Recent Labs  Lab 02/21/24 1729  WBC 13.0*  HGB 11.8*  HCT 36.2  PLT 992*   Recent Labs  Lab 02/21/24 1729  NA 138  K 3.3*  CL 98  CO2 26  BUN 12  CREATININE 0.50  GLUCOSE 124*  CALCIUM 8.9  Respiratory panel: Negative for flu, COVID, RSV Lipase: 18 (normal) UA: Normal PT/INR: Normal Lactic acid 1.3  EKG: My own interpretation (not copied from electronic read) ***    Imaging Studies Performed: CT head: No  acute intracranial abnormality  CXR: No active disease.   Shelby Mattocks, DO 02/21/2024, 11:12 PM PGY-3, [facility]  FPTS Intern pager: [phone], text pages welcome Secure chat group [facility] Teaching Service

## 2024-02-21 NOTE — ED Triage Notes (Signed)
 Pt from [facility] with complaints of lethargy that started today. Pt was normal yesterday but woke today more fatigued. Staff state she isn't having conversation like normal for her. Staff report stronger urine over the past couple of days.  136/80 106 20

## 2024-02-21 NOTE — ED Provider Notes (Signed)
 Judith EMERGENCY DEPARTMENT AT [facility] Provider Note   CSN: [id number] Arrival date & time: 02/21/24  1618     History  Chief Complaint  [name] presents with   Altered Mental Status    SAHITI JOSWICK is a 83 y.o. female.  Judith history is provided by Judith [name], Judith [name] Judith [name]. No language interpreter was used.  Altered Mental Status Presenting symptoms: confusion   Severity:  Moderate Most recent episode:  Today Episode history:  Continuous Timing:  Constant Progression:  Waxing and waning Chronicity:  New Context: nursing home resident   Associated symptoms: fever   Associated symptoms: no abdominal pain, no agitation, no headaches, no light-headedness, no nausea, no rash, no seizures, no slurred speech, no visual change, no vomiting and no weakness        Home Medications Prior to Admission medications   Medication Sig Start Date End Date Taking? Authorizing Provider  carvedilol (COREG) 6.25 MG tablet Take 1 tablet (6.25 mg total) by mouth 2 (two) times daily with a meal. 02/03/24   Elberta Fortis, MD  diclofenac Sodium (VOLTAREN) 1 % GEL Apply 2 g topically 4 (four) times daily as needed (pain). [name] not taking: Reported on 02/06/2024 11/18/23   Alfredo Martinez, MD  famotidine (PEPCID) 20 MG tablet Take 1 tablet (20 mg total) by mouth at bedtime. [name] taking differently: Take 20 mg by mouth every evening. 06/11/19   Noni Saupe, MD  gabapentin (NEURONTIN) 400 MG capsule Take 400 mg by mouth at bedtime.    [provider]  insulin glargine (LANTUS) 100 UNIT/ML injection Inject 0.12 mLs (12 Units total) into Judith skin daily. 02/11/24   Vonna Drafts, MD  insulin lispro (HUMALOG) 100 UNIT/ML injection Inject 0-12 Units into Judith skin 4 (four) times daily -  with meals and at bedtime.    [provider]  methenamine (HIPREX) 1 g tablet Take 1 g by  mouth 2 (two) times daily. 01/21/24 01/20/25  [provider]  metoCLOPramide (REGLAN) 10 MG tablet Take 1 tablet (10 mg total) by mouth 4 (four) times daily -  before meals and at bedtime. Take 1 tablet 10 mg by mouth 3 times/day before meals and at bedtime prn 09/09/23 07/21/24  Pyrtle, Carie Caddy, MD  mirtazapine (REMERON) 7.5 MG tablet Take 1 tablet (7.5 mg total) by mouth at bedtime. 11/12/23   Dameron, Nolberto Hanlon, DO  oxyCODONE (ROXICODONE) 5 MG immediate release tablet Take 0.5 tablets (2.5 mg total) by mouth 2 (two) times daily as needed for severe pain (pain score 7-10). 01/28/24   Vonna Drafts, MD  polyethylene glycol powder (GLYCOLAX/MIRALAX) 17 GM/SCOOP powder Dissolve 1 capful  (17 g) in water and take by mouth daily. 01/27/24   Vonna Drafts, MD  rosuvastatin (CRESTOR) 10 MG tablet Take 1 tablet (10 mg total) by mouth daily. 01/27/24   Vonna Drafts, MD  sertraline (ZOLOFT) 25 MG tablet Take 1 tablet (25 mg total) by mouth daily. 02/04/24   Elberta Fortis, MD      Allergies    Asa [aspirin], Motrin [ibuprofen], Cipro [ciprofloxacin hcl], and Sulfa antibiotics    Review [name] Systems   Review [name] Systems  Constitutional:  Positive for chills, fatigue and fever.  HENT:  Negative for congestion.   Eyes:  Negative for visual disturbance.  Respiratory:  Negative for cough, chest tightness, shortness [name] breath and wheezing.   Cardiovascular:  Negative for chest pain.  Gastrointestinal:  Negative for abdominal pain, constipation, diarrhea, nausea and vomiting.  Genitourinary:  Positive for decreased urine volume (dark per EMS). Negative for dysuria and flank pain.  Musculoskeletal:  Negative for neck pain and neck stiffness.  Skin:  Negative for rash and wound.  Neurological:  Negative for dizziness, seizures, speech difficulty, weakness, light-headedness and headaches.  Psychiatric/Behavioral:  Positive for confusion. Negative for agitation.   All other systems reviewed and are  negative.   Physical Exam Updated Vital Signs BP (!) 154/98 (BP Location: Right Arm)   Pulse (!) 107   Temp (!) 100.5 F (38.1 C) (Rectal)   Resp 18   Ht 5\' 3"  (1.6 m)   Wt 47.6 kg   SpO2 100%   BMI 18.60 kg/m  Physical Exam Vitals and nursing note reviewed.  Constitutional:      General: She is not in acute distress.    Appearance: She is well-developed. She is ill-appearing. She is not toxic-appearing or diaphoretic.  HENT:     Head: Normocephalic and atraumatic.     Nose: No congestion or rhinorrhea.     Mouth/Throat:     Mouth: Mucous membranes are dry.     Pharynx: No oropharyngeal exudate or posterior oropharyngeal erythema.  Eyes:     Extraocular Movements: Extraocular movements intact.     Conjunctiva/sclera: Conjunctivae normal.     Pupils: Pupils are equal, round, and reactive to light.  Cardiovascular:     Rate and Rhythm: Regular rhythm. Tachycardia present.     Heart sounds: No murmur heard. Pulmonary:     Effort: Pulmonary effort is normal. No respiratory distress.     Breath sounds: Normal breath sounds. No wheezing or rhonchi.  Chest:     Chest wall: No tenderness.  Abdominal:     General: Abdomen is flat.     Palpations: Abdomen is soft.     Tenderness: There is no abdominal tenderness. There is no guarding or rebound.  Musculoskeletal:        General: No swelling.     Cervical back: Neck supple. No tenderness.     Right lower leg: No edema.     Left lower leg: No edema.  Skin:    General: Skin is warm and dry.     Capillary Refill: Capillary refill takes less than 2 seconds.     Findings: No erythema.  Neurological:     General: No focal deficit present.     Mental Status: She is alert.     Sensory: No sensory deficit.     Motor: No weakness.  Psychiatric:        Mood and Affect: Mood normal.     ED Results / Procedures / Treatments   Labs (all labs ordered are listed, but only abnormal results are displayed) Labs Reviewed   COMPREHENSIVE METABOLIC PANEL - Abnormal; Notable for Judith following components:      Result Value   Potassium 3.3 (*)    Glucose, Bld 124 (*)    Total Protein 6.2 (*)    Albumin 2.5 (*)    All other components within normal limits  CBC WITH DIFFERENTIAL/PLATELET - Abnormal; Notable for Judith following components:   WBC 13.0 (*)    RBC 3.65 (*)    Hemoglobin 11.8 (*)    Platelets 992 (*)    Neutro Abs 9.7 (*)    Monocytes Absolute 1.2 (*)    All other components within normal limits  URINALYSIS,  W/ REFLEX TO CULTURE (INFECTION SUSPECTED) - Abnormal; Notable for Judith following components:   Color, Urine STRAW (*)    All other components within normal limits  RESP PANEL BY RT-PCR (RSV, FLU A&B, COVID)  RVPGX2  CULTURE, BLOOD (ROUTINE X 2)  CULTURE, BLOOD (ROUTINE X 2)  PROTIME-INR  APTT  LIPASE, BLOOD  I-STAT CG4 LACTIC ACID, ED  I-STAT CG4 LACTIC ACID, ED    EKG EKG Interpretation Date/Time:  Friday February 21 2024 16:31:47 EST Ventricular Rate:  103 PR Interval:  59 QRS Duration:  153 QT Interval:  445 QTC Calculation: 583 R Axis:   -82  Text Interpretation: PAced Right atrial enlargement Nonspecific IVCD with LAD Left ventricular hypertrophy when compared to prior, similar paced rhythm with faster rate No STEMI Confirmed by Theda Belfast [id number]) on 02/21/2024 4:35:19 PM  Radiology CT HEAD WO CONTRAST ( ) Result Date: 02/21/2024 CLINICAL DATA:  Mental status change, unknown cause EXAM: CT HEAD WITHOUT CONTRAST TECHNIQUE: Contiguous axial images were obtained from Judith base [name] Judith skull through Judith vertex without intravenous contrast. RADIATION DOSE REDUCTION: This exam was performed according to Judith departmental dose-optimization program which includes automated exposure control, adjustment [name] the mA and/or kV according to [name] size and/or use [name] iterative reconstruction technique. COMPARISON:  01/24/2019 FINDINGS: Brain: No intracranial hemorrhage, mass effect, or  evidence [name] acute infarct. No hydrocephalus. No extra-axial fluid collection. Age-commensurate cerebral atrophy and chronic small vessel ischemic disease. Chronic left basal ganglia lacunar infarct. Vascular: No hyperdense vessel. Intracranial arterial calcification. Skull: No fracture or focal lesion. Sinuses/Orbits: Mucosal thickening in Judith sphenoid sinuses with air-fluid levels. No mastoid effusion. Other: None. IMPRESSION: 1. No acute intracranial abnormality. 2. Mucosal thickening in Judith sphenoid sinuses with air-fluid levels. Correlate for acute sinusitis. Electronically Signed   By: Minerva Fester M.D.   On: 02/21/2024 18:45   DG Chest Port 1 View Result Date: 02/21/2024 CLINICAL DATA:  Altered mental status EXAM: PORTABLE CHEST 1 VIEW COMPARISON:  02/07/2024 FINDINGS: Left-sided implanted cardiac device remains in place. Aortic atherosclerosis. Judith heart size and mediastinal contours are within normal limits. No focal airspace consolidation, pleural effusion, or pneumothorax. IMPRESSION: No active disease. Electronically Signed   By: Duanne Guess D.O.   On: 02/21/2024 17:20    Procedures Procedures    CRITICAL CARE Performed by: Canary Brim Noelani Harbach Total critical care time: 20 minutes Critical care time was exclusive [name] separately billable procedures and treating other patients. Critical care was necessary to treat or prevent imminent or life-threatening deterioration. Critical care was time spent personally by me on Judith following activities: development [name] treatment plan with [name] and/or surrogate as well as nursing, discussions with consultants, evaluation [name] [name]'s response to treatment, examination [name] [name], obtaining history from [name] or surrogate, ordering and performing treatments and interventions, ordering and review [name] laboratory studies, ordering and review [name] radiographic studies, pulse oximetry and re-evaluation [name] [name]'s condition.  Medications Ordered in  ED Medications  lactated ringers infusion ( Intravenous New Bag/Given 02/21/24 2249)  lactated ringers bolus 1,000 mL (0 mLs Intravenous Stopped 02/21/24 1909)    And  lactated ringers bolus 500 mL (0 mLs Intravenous Stopped 02/21/24 1817)  ceFEPIme (MAXIPIME) 2 g in sodium chloride 0.9 % 100 mL IVPB (0 g Intravenous Stopped 02/21/24 1805)  metroNIDAZOLE (FLAGYL) IVPB 500 mg (0 mg Intravenous Stopped 02/21/24 1845)  vancomycin (VANCOCIN) IVPB 1000 mg/200 mL premix (0 mg Intravenous Stopped 02/21/24 1900)    ED Course/ Medical Decision Making/ A&P  Medical Decision Making Amount and/or Complexity [name] Data Reviewed Labs: ordered. Radiology: ordered.  Risk Prescription drug management. Decision regarding hospitalization.    JAMIEE MILHOLLAND is a 83 y.o. female with a past medical history significant for hypertension, diabetes, gastroparesis, previous partial gastrectomy, GERD, gallstones status postcholecystectomy, hyperlipidemia, previous UTI, pacemaker dependence, depression, diabetes, and polycythemia who presents for altered mental status, fatigue, and strong smelling urine.  According to EMS, [name] was at her baseline yesterday but facility notes she has had some strong smelling urine for Judith last few days.  She was sent in when today she was less responsive and acting more somnolent and fatigue.  She is answering questions but does admit being tired.  She reports some chills but had not had fevers at home until arrival here.  They did not report any congestion or cough and [name] is not in any headache or neck pain.  She is denying any chest pain or abdominal pain.  [name] reports just feeling ill.  On arrival, she is warm to Judith touch.  Lungs were clear and chest was nontender.  Abdomen nontender.  [name] was able to move all extremities.  [name] has dry mucous membranes.  [name] is tachycardic and tachypneic.  Rectal temp was obtained and was febrile.   Clinically concerned about sepsis given tachycardia, tachypnea, and fever in Judith setting [name] suspected urinary symptoms.  Will make her code sepsis and give appropriate antibiotics and fluids.  Will get workup.  Given Judith altered mental status and confusion, will get a CT head as well.  EMS reported her glucose was normal and route.  Anticipate reassessment after workup and she will need admission.  Workup was negative for COVID/flu/RSV, urinalysis did not show UTI and x-ray do not show pneumonia.  Her white count is elevated and her platelets are much higher than before.  CT head did not show acute abnormality and [name] continues to deny headache or neck pain or neck stiffness.  Suspect viral illness however as she was septic and has these quite irregular CBC findings, will admit for further management.  Medicine will admit for further management.        Final Clinical Impression(s) / ED Diagnoses Final diagnoses:  Sepsis, due to unspecified organism, unspecified whether acute organ dysfunction present (HCC)  Fatigue, unspecified type  Thrombocytosis    Clinical Impression: 1. Sepsis, due to unspecified organism, unspecified whether acute organ dysfunction present (HCC)   2. Fatigue, unspecified type   3. Thrombocytosis     Disposition: Admit  This note was prepared with assistance [name] Dragon voice recognition software. Occasional wrong-word or sound-a-like substitutions may have occurred due to Judith inherent limitations [name] voice recognition software.      Chason Mciver, Canary Brim, MD 02/22/24 [phone]

## 2024-02-21 NOTE — Sepsis Progress Note (Signed)
 Code Sepsis protocol being monitored by eLink.

## 2024-02-21 NOTE — ED Notes (Signed)
 Patient transported to CT

## 2024-02-21 NOTE — H&P (Signed)
 Hospital Admission History and Physical Service Pager: [phone]  Patient name: Judith [name] Medical record number: [id number] Date of Birth: May 19, 1941 Age: 83 y.o. Gender: female  Primary Care Provider: Darral Dash, DO Consultants: None Code Status: DNR/DNI which was confirmed with paperwork in room Preferred Emergency Contact:  Contact Information     Name Relation Home Work Mobile   [name],[name] Daughter [phone]  [phone]      Other Contacts   None on File     Chief Complaint: AMS  Assessment and Plan: Judith [name] is a 83 y.o. female presenting with sepsis and AMS. Differential for this patient's presentation of this includes sepsis, stroke, UTI, pyelonephritis, PE, pneumonia, DKA, MI.  Additionally presented with thrombocytosis.  Common causes of thrombocytosis may be anemia/blood loss related, infectious, noninfectious (malignancy, rheumatologic conditions, trauma, medication reaction), postsplenectomy.  Given presentation with fever and vital sign changes, sepsis most likely.  Urine and chest x-ray unremarkable.  In the setting of likely sepsis, normocytosis is most likely to be related to infectious cause. Assessment & Plan Sepsis (HCC) Unknown source, presented febrile, tachycardic, tachypneic however fortunately hypertensive with altered mental status.  ED initiated sepsis protocol, received 1.5 L LR.  Further, unfortunately limited history as patient is unable to converse well.  Will need to gather further information from nursing home in the morning. -Admit to FMTS, progressive unit, attending Dr. Lum Babe -Vital signs per unit -Continue LR 150 mL/h -Continue vancomycin, cefepime, Flagyl; narrow per blood cultures -Follow-up blood cultures -SLP speech eval, restart diet when able -Continuous cardiac monitoring x 24 hours with continuous pulse ox Thrombocytosis Platelets 992, most likely in relation to sepsis. -Monitor CBC  Chronic and Stable  Conditions: T2DM: On Glargine 12u daily and SSI with meals. Monitor CBGs with meals and at bedtime, sensitive SSI. BMP CBG 124.  HTN: no medications currently HLD: hold Crestor 10 mg daily DDD: Chronic issue. Manage any pain with Tylenol and Voltaren gel at this time.  Peripheral neuropathy: On Gabapentin 400 mg at bedtime, restart when able Depression: Hold Cymbalta 30 mg daily  Complete Heart Block with pacemaker: No hx of echo. Will order. PPM interrogation.  Pulmonary nodules: Incidental 3 mm left upper pulmonary nodule found 05/2023 will monitor with low dose CT in 6-12 months, will put in follow up instructions in discharge summary  Gastroparesis: Hx of partial gastrectomy  Depression: hold sertraline 25mg  daily, mirtazapine 7.5mg  daily  FEN/GI: N.p.o. VTE Prophylaxis: Lovenox  Disposition: Progressive  History of Present Illness:  Judith [name] is a 83 y.o. female presenting with altered mental status from nursing home.  In the ED, she presented febrile at 100.5, tachycardic 107, hypertensive and reported tachypnea.   Review Of Systems: Per HPI  Pertinent Past Medical History: per chart review T2DM Depression HTN HLD Pacemaker placed for complete heart block  Peripheral neuropathy DDD CAD s/p CABG Remainder reviewed in history tab.   Pertinent Past Surgical History: per chart review Biliary dilatation Cholecystectomy ERCP Partial gastrectomy Pacemaker  Remainder reviewed in history tab.   Pertinent Social History: per chart review Tobacco use: None Alcohol use: no Other Substance use: no Lives with roommate at [facility]  Pertinent Family History: Mother: Diabetes Father: Diabetes, heart disease Sister: Diabetes Son: Heart disease Remainder reviewed in history tab.   Important Outpatient Medications: Voltaren 4 times daily as needed Famotidine 20 mg nightly Gabapentin 400 mg nightly Glargine 12 units daily Lispro 0 to 12 units 4 times daily with  meals and at bedtime Methenamine 1 g twice daily Reglan as needed Mirtazapine 7.5 mg nightly Oxycodone 2.5 mg twice daily as needed MiraLAX Rosuvastatin 10 mg daily Sertraline 25 mg daily Remainder reviewed in medication history.   Objective: BP (!) 161/96   Pulse 95   Temp 99.5 F (37.5 C) (Oral)   Resp (!) 27   Ht 5\' 3"  (1.6 m)   Wt 47.6 kg   SpO2 99%   BMI 18.60 kg/m  Exam: General: Thin appearing elderly female, NAD Eyes: PERRL, EOMI Cardiovascular: RRR, no murmurs appreciable Respiratory: CTAB, normal WOB on RA Gastrointestinal: Soft, nontender, normoactive bowel sounds Extremities: No BLE edema, lower extremities with flexor contractions Derm: No appreciable injuries or open wounds Neuro: Limited speech, normal grip strength  Labs:  CBC BMET  Recent Labs  Lab 02/21/24 1729  WBC 13.0*  HGB 11.8*  HCT 36.2  PLT 992*   Recent Labs  Lab 02/21/24 1729  NA 138  K 3.3*  CL 98  CO2 26  BUN 12  CREATININE 0.50  GLUCOSE 124*  CALCIUM 8.9     Respiratory panel: Negative for flu, COVID, RSV Lipase: 18 (normal) UA: Normal PT/INR: Normal Lactic acid 1.3  EKG: paced rhythm  Imaging Studies Performed: CT head: No acute intracranial abnormality  CXR: No active disease.   Shelby Mattocks, DO 02/21/2024, 11:12 PM PGY-3, [facility]  FPTS Intern pager: [phone], text pages welcome Secure chat group [facility] Teaching Service

## 2024-02-22 ENCOUNTER — Inpatient Hospital Stay (HOSPITAL_COMMUNITY)

## 2024-02-22 DIAGNOSIS — A419 Sepsis, unspecified organism: Secondary | ICD-10-CM | POA: Diagnosis not present

## 2024-02-22 DIAGNOSIS — D75839 Thrombocytosis, unspecified: Secondary | ICD-10-CM

## 2024-02-22 DIAGNOSIS — R410 Disorientation, unspecified: Secondary | ICD-10-CM | POA: Diagnosis not present

## 2024-02-22 LAB — GLUCOSE, CAPILLARY
Glucose-Capillary: 79 mg/dL (ref 70–99)
Glucose-Capillary: 93 mg/dL (ref 70–99)
Glucose-Capillary: 108 mg/dL — ABNORMAL HIGH (ref 70–99)
Glucose-Capillary: 112 mg/dL — ABNORMAL HIGH (ref 70–99)

## 2024-02-22 LAB — BASIC METABOLIC PANEL
Anion gap: 16 — ABNORMAL HIGH (ref 5–15)
BUN: 8 mg/dL (ref 8–23)
CO2: 24 mmol/L (ref 22–32)
Calcium: 8.9 mg/dL (ref 8.9–10.3)
Chloride: 99 mmol/L (ref 98–111)
Creatinine, Ser: 0.48 mg/dL (ref 0.44–1.00)
GFR, Estimated: >60 mL/min (ref >60)
Glucose, Bld: 84 mg/dL (ref 70–99)
Potassium: 3.2 mmol/L — ABNORMAL LOW (ref 3.5–5.1)
Sodium: 139 mmol/L (ref 135–145)

## 2024-02-22 LAB — CBC
HCT: 33.4 % — ABNORMAL LOW (ref 36.0–46.0)
Hemoglobin: 11.1 g/dL — ABNORMAL LOW (ref 12.0–15.0)
MCH: 32.4 pg (ref 26.0–34.0)
MCHC: 33.2 g/dL (ref 30.0–36.0)
MCV: 97.4 fL (ref 80.0–100.0)
Platelets: 779 10*3/uL — ABNORMAL HIGH (ref 150–400)
RBC: 3.43 MIL/uL — ABNORMAL LOW (ref 3.87–5.11)
RDW: 13.6 % (ref 11.5–15.5)
WBC: 11.4 10*3/uL — ABNORMAL HIGH (ref 4.0–10.5)
nRBC: 0 % (ref 0.0–0.2)

## 2024-02-22 LAB — D-DIMER, QUANTITATIVE: D-Dimer, Quant: 1.68 ug{FEU}/mL — ABNORMAL HIGH (ref 0.00–0.50)

## 2024-02-22 LAB — TSH: TSH: 1.731 u[IU]/mL (ref 0.350–4.500)

## 2024-02-22 LAB — TROPONIN I (HIGH SENSITIVITY): Troponin I (High Sensitivity): 50 ng/L — ABNORMAL HIGH (ref <18)

## 2024-02-22 MED ORDER — ENOXAPARIN SODIUM 40 MG/0.4ML IJ SOSY
40.0000 mg | PREFILLED_SYRINGE | INTRAMUSCULAR | Status: DC
  Administered 2024-02-22 – 2024-03-03 (×11): 40 mg via SUBCUTANEOUS
  Filled 2024-02-22 (×11): qty 0.4

## 2024-02-22 MED ORDER — POTASSIUM CHLORIDE 10 MEQ/100ML IV SOLN
INTRAVENOUS | Status: AC
  Administered 2024-02-22: 10 meq via INTRAVENOUS
  Filled 2024-02-22: qty 100

## 2024-02-22 MED ORDER — INSULIN ASPART 100 UNIT/ML IJ SOLN
0.0000 [IU] | Freq: Three times a day (TID) | INTRAMUSCULAR | Status: DC
  Administered 2024-02-23: 3 [IU] via SUBCUTANEOUS
  Administered 2024-02-23: 5 [IU] via SUBCUTANEOUS
  Administered 2024-02-23: 2 [IU] via SUBCUTANEOUS
  Administered 2024-02-24: 3 [IU] via SUBCUTANEOUS
  Administered 2024-02-24: 5 [IU] via SUBCUTANEOUS
  Administered 2024-02-24 – 2024-02-25 (×2): 2 [IU] via SUBCUTANEOUS
  Administered 2024-02-25: 5 [IU] via SUBCUTANEOUS
  Administered 2024-02-25: 2 [IU] via SUBCUTANEOUS
  Administered 2024-02-26: 1 [IU] via SUBCUTANEOUS
  Administered 2024-02-26: 3 [IU] via SUBCUTANEOUS
  Administered 2024-02-26: 5 [IU] via SUBCUTANEOUS
  Administered 2024-02-27: 2 [IU] via SUBCUTANEOUS
  Administered 2024-02-27: 3 [IU] via SUBCUTANEOUS
  Administered 2024-02-27 – 2024-02-28 (×3): 5 [IU] via SUBCUTANEOUS
  Administered 2024-02-29: 3 [IU] via SUBCUTANEOUS
  Administered 2024-02-29: 2 [IU] via SUBCUTANEOUS
  Administered 2024-03-01: 1 [IU] via SUBCUTANEOUS
  Administered 2024-03-01: 5 [IU] via SUBCUTANEOUS
  Administered 2024-03-01: 3 [IU] via SUBCUTANEOUS
  Administered 2024-03-02: 1 [IU] via SUBCUTANEOUS
  Administered 2024-03-02: 9 [IU] via SUBCUTANEOUS
  Administered 2024-03-02: 3 [IU] via SUBCUTANEOUS
  Administered 2024-03-03: 1 [IU] via SUBCUTANEOUS
  Administered 2024-03-03: 3 [IU] via SUBCUTANEOUS

## 2024-02-22 MED ORDER — IOHEXOL 350 MG/ML SOLN
75.0000 mL | Freq: Once | INTRAVENOUS | Status: AC | PRN
  Administered 2024-02-22: 75 mL via INTRAVENOUS

## 2024-02-22 MED ORDER — POTASSIUM CHLORIDE 10 MEQ/100ML IV SOLN
INTRAVENOUS | Status: AC
Start: 2024-02-22 — End: 2024-02-23
  Filled 2024-02-22: qty 100

## 2024-02-22 MED ORDER — VANCOMYCIN HCL 500 MG/100ML IV SOLN
500.0000 mg | INTRAVENOUS | Status: DC
  Filled 2024-02-22: qty 100

## 2024-02-22 MED ORDER — VANCOMYCIN HCL IN DEXTROSE 1-5 GM/200ML-% IV SOLN
1000.0000 mg | INTRAVENOUS | Status: DC
  Administered 2024-02-23: 1000 mg via INTRAVENOUS
  Filled 2024-02-22 (×2): qty 200

## 2024-02-22 MED ORDER — DULOXETINE HCL 30 MG PO CPEP
30.0000 mg | ORAL_CAPSULE | Freq: Every day | ORAL | Status: DC
  Administered 2024-02-22 – 2024-03-03 (×11): 30 mg via ORAL
  Filled 2024-02-22 (×11): qty 1

## 2024-02-22 MED ORDER — FAMOTIDINE 20 MG PO TABS
20.0000 mg | ORAL_TABLET | Freq: Every day | ORAL | Status: DC
  Administered 2024-02-22 – 2024-03-02 (×10): 20 mg via ORAL
  Filled 2024-02-22 (×10): qty 1

## 2024-02-22 MED ORDER — ROSUVASTATIN CALCIUM 5 MG PO TABS
10.0000 mg | ORAL_TABLET | Freq: Every day | ORAL | Status: DC
  Administered 2024-02-22 – 2024-03-03 (×11): 10 mg via ORAL
  Filled 2024-02-22 (×11): qty 2

## 2024-02-22 MED ORDER — POTASSIUM CHLORIDE 10 MEQ/100ML IV SOLN
10.0000 meq | INTRAVENOUS | Status: AC
  Administered 2024-02-22 (×3): 10 meq via INTRAVENOUS
  Filled 2024-02-22 (×3): qty 100

## 2024-02-22 MED ORDER — DICLOFENAC SODIUM 1 % EX GEL
2.0000 g | Freq: Four times a day (QID) | CUTANEOUS | Status: DC | PRN
  Administered 2024-02-24 – 2024-02-29 (×8): 2 g via TOPICAL
  Filled 2024-02-22 (×2): qty 100

## 2024-02-22 MED ORDER — SODIUM CHLORIDE 0.9 % IV SOLN
2.0000 g | Freq: Two times a day (BID) | INTRAVENOUS | Status: DC
  Administered 2024-02-22 – 2024-02-24 (×5): 2 g via INTRAVENOUS
  Filled 2024-02-22 (×5): qty 12.5

## 2024-02-22 MED ORDER — GABAPENTIN 400 MG PO CAPS
400.0000 mg | ORAL_CAPSULE | Freq: Every day | ORAL | Status: DC
  Administered 2024-02-22 – 2024-03-02 (×10): 400 mg via ORAL
  Filled 2024-02-22 (×10): qty 1

## 2024-02-22 NOTE — Assessment & Plan Note (Signed)
 Source remains unknown.  Afebrile, alert.  SLP is seen, and she can have a diet. -Discontinue MIVF, encourage p.o. hydration -Continue vancomycin, cefepime, Flagyl; narrow per blood cultures -Follow-up blood cultures -Continuous cardiac monitoring x 24 hours with continuous pulse ox

## 2024-02-22 NOTE — Plan of Care (Signed)
 Spoke with patient daughter.  Updated status and answered questions.  Patient daughter I spoke with palliative in the past, not should at this time.  They have an appropriate plan.  Will continue to update daughter as more results come.

## 2024-02-22 NOTE — Progress Notes (Signed)
 Pharmacy Antibiotic Note  Judith [name] is a 83 y.o. female admitted on 02/21/2024 with AMS and concerns for sepsis.  Pharmacy has been consulted for cefepime/vancomycin dosing.  Adjusting vancomycin dose to keep appropriate AUC goal of 400-550.  Plan: Continue cefepime 2g IV every 12 hours Change vancomycin to 1000mg  IV every 36 hours (AUC 516.9, Scr 0.8, Vd 0.72, TBW) Monitor renal function & bcx Follow up signs of clinical improvement, LOT, de-escalation of antibiotics   Height: 5\' 3"  (160 cm) Weight: 47.6 kg (105 lb) IBW/kg (Calculated) : 52.4  Temp (24hrs), Avg:98.3 F (36.8 C), Min:97.4 F (36.3 C), Max:100.5 F (38.1 C)  Recent Labs  Lab 02/21/24 1729 02/21/24 1758 02/22/24 0453  WBC 13.0*  --  11.4*  CREATININE 0.50  --  0.48  LATICACIDVEN  --  1.3  --     Estimated Creatinine Clearance: 40 mL/min (by C-G formula based on SCr of 0.48 mg/dL).    Allergies  Allergen Reactions   Asa [Aspirin] Other (See Comments)    Causes ulcers    Motrin [Ibuprofen] Other (See Comments)    GI intolerance   Cipro [Ciprofloxacin Hcl] Rash    Ok to use eye drop formulation   Sulfa Antibiotics Other (See Comments)    GI issues    Antimicrobials this admission: Cefepime 2/28 >>  Vancomycin 2/28 >>   Microbiology results: 2/28 BCx: ng < 24 hours  Thank you for allowing pharmacy to be a part of this patient's care.  Nicole Kindred, PharmD PGY1 Pharmacy Resident 02/22/2024 1:04 PM

## 2024-02-22 NOTE — Assessment & Plan Note (Signed)
 Platelets 992, most likely in relation to sepsis. -Monitor CBC

## 2024-02-22 NOTE — Assessment & Plan Note (Signed)
 Platelets 992 -> 779.  Suspect acute phase reactant. -Monitor CBC

## 2024-02-22 NOTE — Assessment & Plan Note (Signed)
 Hold home insulin.  Patient with normal CBGs, likely in setting of poor p.o. intake. - CBG monitoring, sliding scale - Add back basal insulin as needed

## 2024-02-22 NOTE — Progress Notes (Signed)
 Pharmacy Antibiotic Note  Judith [name] is a 83 y.o. female admitted on 02/21/2024 with AMS and concerns for sepsis.  Pharmacy has been consulted for cefepime/vancomycin dosing.  -Received vanc/cefepime/ flagyl in ED -WBC 13, sCr 0.5, lactate WNL, Tmax 100.5 -Blood cultures collected  Plan: -Cefepime 2g IV every 12 hours -Vancomycin 1g IV x1 -Vancomycin 500mg  IV every 24 hours (AUC 408, Vd 0.72, TBW) -Monitor renal function -Follow up signs of clinical improvement, LOT, de-escalation of antibiotics   Height: 5\' 3"  (160 cm) Weight: 47.6 kg (105 lb) IBW/kg (Calculated) : 52.4  Temp (24hrs), Avg:98.7 F (37.1 C), Min:97.4 F (36.3 C), Max:100.5 F (38.1 C)  Recent Labs  Lab 02/21/24 1729 02/21/24 1758  WBC 13.0*  --   CREATININE 0.50  --   LATICACIDVEN  --  1.3    Estimated Creatinine Clearance: 40 mL/min (by C-G formula based on SCr of 0.5 mg/dL).    Allergies  Allergen Reactions   Asa [Aspirin] Other (See Comments)    Causes ulcers    Motrin [Ibuprofen] Other (See Comments)    GI intolerance   Cipro [Ciprofloxacin Hcl] Rash    Ok to use eye drop formulation   Sulfa Antibiotics Other (See Comments)    GI issues    Antimicrobials this admission: Cefepime 2/28 >>  Vancomycin 2/28 >>   Microbiology results: 2/28 BCx:   Thank you for allowing pharmacy to be a part of this patient's care.  Arabella Merles, PharmD. Clinical Pharmacist 02/22/2024 2:26 AM

## 2024-02-22 NOTE — Plan of Care (Addendum)
 FMTS Brief Progress Note  S: Patient lying in bed and awake.  When asked if she feels she is having trouble breathing she shakes her head yes.  When asked if having chest pain she shakes her head no.  When asked if she knows her name and if she knows where she is she shakes her head no.    O: BP (!) 142/91 (BP Location: Left Arm)   Pulse (!) 106   Temp 97.8 F (36.6 C) (Oral)   Resp 17   Ht 5\' 3"  (1.6 m)   Wt 47.6 kg   SpO2 100%   BMI 18.60 kg/m   General: Frail elderly 83 year old female, lying in bed, NAD Cardio: RRR (heart rate in high 90s when I was in the room), no murmur Lungs: CTAB, normal effort, with multiple random apneic spells lasting ~5 seconds Neuro: Alert, not oriented to self or place Psych: Anxious appearing  A/P: Apneic episodes Previously noted chest pain has resolved.  Still having apneic episodes which are not currently associated with desaturation and with good air movement on auscultation.  She is anxious appearing with mild intermittent tachycardia.  Unsure if anxiety is contributing to apneic episodes or if apneic episodes it are causing anxiety. -CTPE with no evidence PE -EKG unchanged from prior. Will trend trops -CTM for desaturation  Erick Alley, DO 02/22/2024, 8:59 PM PGY-3, [facility] Night Resident  Please page [phone] with questions.

## 2024-02-22 NOTE — Plan of Care (Addendum)
 FMTS Interim Progress Note  S: Notified by nursing staff that patient had RED MEWS.  Elevated blood pressure, tachypnea, elevated heart rate.  Went to evaluate patient at bedside.  Patient was initially asleep, witnessed multiple apneic events.  Gently woke patient, she continued to have apneic events.  She was saying her chest hurt and she was having trouble breathing.  O: BP (!) 166/88 (BP Location: Left Arm)   Pulse (!) 114   Temp 98.1 F (36.7 C) (Axillary)   Resp (!) 27   Ht 5\' 3"  (1.6 m)   Wt 47.6 kg   SpO2 100%   BMI 18.60 kg/m    Cardio: Tachycardic, regular rhythm Respiratory: Intermittent apneic episodes, desats to low 80s, followed by tachypnea.  Clear to auscultation.  A/P: Apnea  Chest pain Witnessed awake/sleeping apnea approximately 6 seconds, followed by tachypnea. Known sleep apnea , suspect this is primary cause of desats. Patient awake concerned for trouble breathing and chest pain. - Stat CXR, troponin, EKG, D-dimer - If patient continues to desat, placed 2L [facility] - Consider CPAP with sleep pending workup - Check TSH - Will follow closely  Tiffany Kocher, DO 02/22/2024, 1:19 PM PGY-2, [facility] Family Medicine Service pager [phone]

## 2024-02-22 NOTE — Hospital Course (Addendum)
 Judith [name] is a 83 y.o. female with history of type 2 diabetes (on insulin), HLD, DDD, peripheral neuropathy, depression, complete heart block with pacemaker, gastroparesis and depression.  She presented to the ED from her skilled nursing facility with concern of altered mental status.  She met sepsis criteria and was admitted for IV antibiotics, IV fluids and monitoring.  Sepsis  AMS Patient remained hemodynamically stable and afebrile during hospitalization.  No source was ever determined for infection however she was continued on IV vancomycin, cefepime, Flagyl with improvement.  On 3/3 patient was transitioned to oral Augmentin for total of 7 days of antibiotic coverage (2/28 - 3/6).  Thrombocytosis Patient initially with elevated platelets to 992.  This was monitored daily with CBCs and platelets did downtrend.  This was thought to be an acute phase reactant in light of patient's sepsis.   Type 2 diabetes Patient with poor oral intake and home insulin was held.  CBGs were monitored and remained stable for several days. Once patient increased p.o. intake basal insulin was restarted.  At time of discharge patient was on 5 units of basal insulin daily.  Other chronic conditions were medically managed with home medications and formulary alternatives as necessary (HLD, DDD, peripheral neuropathy, depression, complete heart block with pacemaker, depression).  Follow-up recommendations: 1) follow-up basal insulin as this was decreased in the hospital due to poor p.o. 2) Incidental 3 mm left upper pulmonary nodule was found 05/2023; recommendation at the time was for monitoring with low dose CT in 6-12 months. Consider ordering this if aligns with GOC.

## 2024-02-22 NOTE — Assessment & Plan Note (Addendum)
 Unknown source, presented febrile, tachycardic, tachypneic however fortunately hypertensive with altered mental status.  ED initiated sepsis protocol, received 1.5 L LR.  Further, unfortunately limited history as patient is unable to converse well.  Will need to gather further information from nursing home in the morning. -Admit to FMTS, progressive unit, attending Dr. Lum Babe -Vital signs per unit -Continue LR 150 mL/h -Continue vancomycin, cefepime, Flagyl; narrow per blood cultures -Follow-up blood cultures -SLP speech eval, restart diet when able -Continuous cardiac monitoring x 24 hours with continuous pulse ox

## 2024-02-22 NOTE — Plan of Care (Signed)
 [facility] and rehabilitation  On Thursday normal self. Yesterday, became lethargic and drowsy. She talks (verbalize pain and needs, but oriented to self only), feeds self. Complained about knee pain (known, chronic). Her vitals were stable, no recorded fevers at the location. They held oxycodone in the afternoon, and she was more alert but agitated.  Does not have high BP at baseline, not currently on treatment. BP elevates after exercise. CBGs have been stable. 14 U glargine every AM, 2-4 U NovoLog with meals.

## 2024-02-22 NOTE — Evaluation (Signed)
 Clinical/Bedside Swallow Evaluation Patient Details  Name: Judith [name] MRN: [id number] Date of Birth: 11-Apr-1941  Today's Date: 02/22/2024 Time: SLP Start Time (ACUTE ONLY): 0847 SLP Stop Time (ACUTE ONLY): 0912 SLP Time Calculation (min) (ACUTE ONLY): 25 min  Past Medical History:  Past Medical History:  Diagnosis Date   Abdominal pain 06/13/2023   Allergy 1990   Anemia    Arthritis    Biliary gastritis    Blood transfusion without reported diagnosis    2007   Cervical spine arthritis 04/05/2016   Choledocholithiasis    Controlled type 2 diabetes mellitus with diabetic neuropathy, with long-term current use of insulin (HCC)    Depression    Diabetes mellitus type 2 with complications (HCC)    Dilated bile duct    Duodenal ulcer disease    Elevated liver function tests    Encounter for long-term (current) use of insulin (HCC)    Encounter to establish care with new doctor 08/07/2022   Fall 01/11/2023   Gallstones 01/17/2018   Gastric ulcer    Gastritis and gastroduodenitis    Gastroparesis    severe   GERD (gastroesophageal reflux disease)    Gout    tested for gout but was not   Heart murmur    Hiatal hernia    Hyperlipidemia    in past not now   Hypertension    Hypokalemia, gastrointestinal losses    secondary to severe gastroparesis   Internal hemorrhoids    Long-term insulin use in type 2 diabetes (HCC) 04/05/2016   Osteoporosis    just had bone density test possible    Peripheral neuropathy    Postnasal drip 11/01/2022   Presence of cardiac pacemaker    S/P partial gastrectomy    due to severe gastric and gudoenal ulcers   Seasonal allergies    Sleep apnea    could not use CPAP   UTI (urinary tract infection)    Past Surgical History:  Past Surgical History:  Procedure Laterality Date   BILIARY DILATION  02/01/2022   Procedure: BILIARY DILATION;  Surgeon: Lemar Lofty., MD;  Location: [facility] ENDOSCOPY;  Service: Gastroenterology;;   BILIARY  DILATION  04/09/2022   Procedure: BILIARY DILATION;  Surgeon: Lemar Lofty., MD;  Location: [facility] ENDOSCOPY;  Service: Gastroenterology;;   BILIARY DILATION  06/13/2023   Procedure: BILIARY DILATION;  Surgeon: Lemar Lofty., MD;  Location: [facility] ENDOSCOPY;  Service: Gastroenterology;;   BILIARY STENT PLACEMENT N/A 11/30/2021   Procedure: BILIARY STENT PLACEMENT;  Surgeon: Lemar Lofty., MD;  Location: [facility] ENDOSCOPY;  Service: Gastroenterology;  Laterality: N/A;   BILIARY STENT PLACEMENT  02/01/2022   Procedure: BILIARY STENT PLACEMENT;  Surgeon: Meridee Score Netty Starring., MD;  Location: [facility] ENDOSCOPY;  Service: Gastroenterology;;   BIOPSY  11/30/2021   Procedure: BIOPSY;  Surgeon: Lemar Lofty., MD;  Location: [facility] ENDOSCOPY;  Service: Gastroenterology;;   BIOPSY  04/09/2022   Procedure: BIOPSY;  Surgeon: Lemar Lofty., MD;  Location: [facility] ENDOSCOPY;  Service: Gastroenterology;;   CHOLECYSTECTOMY N/A 12/06/2021   Procedure: LAPAROSCOPIC CHOLECYSTECTOMY, LYSIS OF ADHESIONS;  Surgeon: Harriette Bouillon, MD;  Location: [facility] ORS;  Service: General;  Laterality: N/A;   COLONOSCOPY  02/11/2018   no polyps, + small internal hemorrhoids   ENDOSCOPIC RETROGRADE CHOLANGIOPANCREATOGRAPHY (ERCP) WITH PROPOFOL N/A 11/30/2021   Procedure: ENDOSCOPIC RETROGRADE CHOLANGIOPANCREATOGRAPHY (ERCP) WITH PROPOFOL;  Surgeon: Lemar Lofty., MD;  Location: [facility] ENDOSCOPY;  Service: Gastroenterology;  Laterality: N/A;   ENDOSCOPIC RETROGRADE CHOLANGIOPANCREATOGRAPHY (ERCP) WITH  PROPOFOL N/A 02/01/2022   Procedure: ENDOSCOPIC RETROGRADE CHOLANGIOPANCREATOGRAPHY (ERCP) WITH PROPOFOL;  Surgeon: Meridee Score Netty Starring., MD;  Location: [facility] ENDOSCOPY;  Service: Gastroenterology;  Laterality: N/A;   ENDOSCOPIC RETROGRADE CHOLANGIOPANCREATOGRAPHY (ERCP) WITH PROPOFOL N/A 04/09/2022   Procedure: ENDOSCOPIC RETROGRADE CHOLANGIOPANCREATOGRAPHY (ERCP) WITH PROPOFOL;  Surgeon: Meridee Score  Netty Starring., MD;  Location: [facility] ENDOSCOPY;  Service: Gastroenterology;  Laterality: N/A;   ENDOSCOPIC RETROGRADE CHOLANGIOPANCREATOGRAPHY (ERCP) WITH PROPOFOL N/A 06/13/2023   Procedure: ENDOSCOPIC RETROGRADE CHOLANGIOPANCREATOGRAPHY (ERCP) WITH PROPOFOL;  Surgeon: Meridee Score Netty Starring., MD;  Location: [facility] ENDOSCOPY;  Service: Gastroenterology;  Laterality: N/A;   ESOPHAGOGASTRODUODENOSCOPY (EGD) WITH PROPOFOL N/A 11/30/2021   Procedure: ESOPHAGOGASTRODUODENOSCOPY (EGD) WITH PROPOFOL;  Surgeon: Meridee Score Netty Starring., MD;  Location: [facility] ENDOSCOPY;  Service: Gastroenterology;  Laterality: N/A;   EUS N/A 12/12/2017   Procedure: UPPER ENDOSCOPIC ULTRASOUND (EUS) RADIAL;  Surgeon: Rachael Fee, MD;  Location: [facility] ENDOSCOPY;  Service: Endoscopy;  Laterality: N/A;   EYE SURGERY  2024   GASTRECTOMY     JOINT REPLACEMENT     pace maker     PARTIAL GASTRECTOMY     PPM GENERATOR CHANGEOUT N/A 04/25/2023   Procedure: PPM GENERATOR CHANGEOUT;  Surgeon: Thurmon Fair, MD;  Location: [facility] INVASIVE CV LAB;  Service: Cardiovascular;  Laterality: N/A;   REMOVAL OF STONES  02/01/2022   Procedure: REMOVAL OF STONES;  Surgeon: Meridee Score Netty Starring., MD;  Location: [facility] ENDOSCOPY;  Service: Gastroenterology;;   REMOVAL OF STONES  04/09/2022   Procedure: REMOVAL OF STONES;  Surgeon: Lemar Lofty., MD;  Location: [facility] ENDOSCOPY;  Service: Gastroenterology;;   REMOVAL OF STONES  06/13/2023   Procedure: REMOVAL OF STONES;  Surgeon: Lemar Lofty., MD;  Location: [facility] ENDOSCOPY;  Service: Gastroenterology;;   Dennison Mascot  11/30/2021   Procedure: Dennison Mascot;  Surgeon: Lemar Lofty., MD;  Location: [facility] ENDOSCOPY;  Service: Gastroenterology;;   Francine Graven REMOVAL  02/01/2022   Procedure: STENT REMOVAL;  Surgeon: Lemar Lofty., MD;  Location: [facility] ENDOSCOPY;  Service: Gastroenterology;;   Francine Graven REMOVAL  04/09/2022   Procedure: STENT REMOVAL;  Surgeon: Lemar Lofty., MD;   Location: [facility] ENDOSCOPY;  Service: Gastroenterology;;   SUBMUCOSAL TATTOO INJECTION  11/30/2021   Procedure: SUBMUCOSAL TATTOO INJECTION;  Surgeon: Lemar Lofty., MD;  Location: [facility] ENDOSCOPY;  Service: Gastroenterology;;   TONSILLECTOMY     45 years ago   UPPER GASTROINTESTINAL ENDOSCOPY     HPI:  Pt is a 83 y.o. female presenting with sepsis and AMS.  Additionally presented with thrombocytosis. CT head and CXR both negative for acute findings or active disease. Pt has been seen by SLP for swallow eval on two previous admissions. On both 01/25/24 and 02/08/24, pt without s/sx of aspiration with POs even when challenged. Dys 3 diet/thin liquids recommended due to pt preference. PMH: T2DM, Depression, HTN, HLD, Pacemaker placed for complete heart block , Peripheral neuropathy, DDD, CAD s/p CABG.    Assessment / Plan / Recommendation  Clinical Impression  Pt continues to have no overt s/sx of aspiration with POs, consistent with previous SLP visits, however pt mentation, endurance and independence has changed since last visit on 02/08/24. Daughter at bedside reports no observed or reported difficulties swallowing at nursing home. She does note that pt has had to have increased assist with ADLs since last hospitalization. This date, pt alert and conversing simply with SLP. Oral cavity noted with dried secretions, which clinician attempted to clear with thorough oral care. On 2/15, pt self-fed all POs during eval. This  AM pt required hand over hand assist for self-feeding, except for graham cracker which she fed by hand. No clinical signs of aspiration noted with small amounts of solid or single sips of thin liquids, pt declining larger bolus sizes or consecutive sips of liquids. RR fluctuated during intake, but returned to baseline quickly. She also became increasingly fatigued. High blood pressure was noted on monitor, pt appeared unchanged in status, however RN was alerted to assess pt. Given recent  fluctuations in mentation/medical status and reduced endurance during PO intake, recommend initation of dys 1 (puree) diet with thin liquids with 1:1 supervision/assist with all meals/intake. Adherence to aspiration precautions. If pt mentation changes and she is not able to be alert/safe for POs, make NPO and consult SLP. Otherwise, will f/u for diet tolerance and family/pt education early next week.  SLP Visit Diagnosis: Dysphagia, unspecified (R13.10)    Aspiration Risk  Mild aspiration risk    Diet Recommendation Dysphagia 1 (Puree);Thin liquid    Liquid Administration via: Cup;Straw Medication Administration: Whole meds with puree (or crushed as tolerated) Supervision: Full supervision/cueing for compensatory strategies;Staff to assist with self feeding Compensations: Minimize environmental distractions;Slow rate;Small sips/bites Postural Changes: Seated upright at 90 degrees    Other  Recommendations Oral Care Recommendations: Oral care BID;Staff/trained caregiver to provide oral care    Recommendations for follow up therapy are one component of a multi-disciplinary discharge planning process, led by the attending physician.  Recommendations may be updated based on patient status, additional functional criteria and insurance authorization.  Follow up Recommendations  (TBD)      Assistance Recommended at Discharge    Functional Status Assessment Patient has had a recent decline in their functional status and demonstrates the ability to make significant improvements in function in a reasonable and predictable amount of time.  Frequency and Duration min 2x/week  2 weeks       Prognosis Prognosis for improved oropharyngeal function: Fair Barriers to Reach Goals: Cognitive deficits      Swallow Study   General Date of Onset: 02/21/24 HPI: Pt is a 83 y.o. female presenting with sepsis and AMS.  Additionally presented with thrombocytosis. CT head and CXR both negative for acute  findings or active disease. Pt has been seen by SLP for swallow eval on two previous admissions. On both 01/25/24 and 02/08/24, pt without s/sx of aspiration with POs even when challenged. Dys 3 diet/thin liquids recommended due to pt preference. PMH: T2DM, Depression, HTN, HLD, Pacemaker placed for complete heart block , Peripheral neuropathy, DDD, CAD s/p CABG. Type of Study: Bedside Swallow Evaluation Previous Swallow Assessment: see HPI Diet Prior to this Study: NPO Temperature Spikes Noted: Yes (100.5) Respiratory Status: Room air History of Recent Intubation: No Behavior/Cognition: Alert;Cooperative;Confused Oral Cavity Assessment: Dried secretions;Dry Oral Care Completed by SLP: Yes Oral Cavity - Dentition: Poor condition;Missing dentition Vision: Functional for self-feeding Self-Feeding Abilities: Needs assist Patient Positioning: Upright in bed;Postural control adequate for testing Baseline Vocal Quality: Normal Volitional Cough: Weak Volitional Swallow: Unable to elicit    Oral/Motor/Sensory Function Overall Oral Motor/Sensory Function: Within functional limits   Ice Chips Ice chips: Not tested   Thin Liquid Thin Liquid: Within functional limits Presentation: Straw    Nectar Thick Nectar Thick Liquid: Not tested   Honey Thick Honey Thick Liquid: Not tested   Puree Puree: Impaired Presentation: Spoon Oral Phase Impairments: Impaired mastication;Reduced lingual movement/coordination   Solid     Solid: Impaired Presentation: Self Fed Oral Phase Impairments: Impaired mastication;Reduced lingual  movement/coordination       Avie Echevaria, MA, CCC-SLP Acute Rehabilitation Services Office Number: [phone]  Paulette Blanch 02/22/2024,9:37 AM

## 2024-02-22 NOTE — Progress Notes (Signed)
 Daily Progress Note Intern Pager: [phone]  Patient name: Judith [name] Medical record number: [id number] Date of birth: 22-Mar-1941 Age: 83 y.o. Gender: female  Primary Care Provider: Darral Dash, DO Consultants: None Code Status: DNR-Limited  Pt Overview and Major Events to Date:  2/28: Admitted  Assessment and Plan:  Judith [name] is a 83 y.o. female presenting with sepsis and AMS.  Admitted for concern of sepsis.  Discussed with her facility (see separate note), that patient's baseline is alert/conversational/able to feed/and communicate her needs but is only oriented to self.  Additionally, facility reports patient does not have hypertension and is not on medications at home.  Her sugars have been controlled, she is currently taking 14 units glargine and sliding scale with meals.  She was in her normal state of health until 02/21/2024. Assessment & Plan Sepsis [facility]) Source remains unknown.  Afebrile, alert.  SLP is seen, and she can have a diet. -Discontinue MIVF, encourage p.o. hydration -Continue vancomycin, cefepime, Flagyl; narrow per blood cultures -Follow-up blood cultures -Continuous cardiac monitoring x 24 hours with continuous pulse ox Thrombocytosis Platelets 992 -> 779.  Suspect acute phase reactant. -Monitor CBC Confusion Patient alert, oriented to self this morning.  More conversational.  Denies pain. -Improving, continue to avoid sedating medications Controlled type 2 diabetes mellitus with diabetic neuropathy, with long-term current use of insulin (HCC) Hold home insulin.  Patient with normal CBGs, likely in setting of poor p.o. intake. - CBG monitoring, sliding scale - Add back basal insulin as needed  Chronic and Stable Problems:  HTN: no medications currently HLD: hold Crestor 10 mg daily DDD: Chronic issue. Manage any pain with Tylenol and Voltaren gel at this time.  Peripheral neuropathy: On Gabapentin 400 mg at bedtime, restart when  able Depression: Hold Cymbalta 30 mg daily  Complete Heart Block with pacemaker: No hx of echo. Will order. PPM interrogation.  Pulmonary nodules: Incidental 3 mm left upper pulmonary nodule found 05/2023 will monitor with low dose CT in 6-12 months, will put in follow up instructions in discharge summary  Gastroparesis: Hx of partial gastrectomy  Depression: hold sertraline 25mg  daily, mirtazapine 7.5mg  daily   FEN/GI: Dysphagia 1, thin fluids PPx: Lovenox Dispo:SNF pending clinical improvement .   Subjective:  Awake, alert.  Oriented only to self.  Denies pain.  Objective: Temp:  [97.4 F (36.3 C)-100.5 F (38.1 C)] 97.6 F (36.4 C) (03/01 0834) Pulse Rate:  [82-107] 82 (03/01 0304) Resp:  [10-27] 15 (03/01 0834) BP: (154-178)/(57-98) 156/88 (03/01 0834) SpO2:  [98 %-100 %] 98 % (03/01 0304) Weight:  [47.6 kg] 47.6 kg (02/28 1622) Physical Exam: General: Chronically ill-appearing, resting comfortably in bed Cardiovascular: RRR Respiratory: CTAB, normal work of breathing on room air Abdomen: Soft, not tender, not distended. Extremities: Moving all 4 extremities, no edema  Laboratory: Most recent CBC Lab Results  Component Value Date   WBC 11.4 (H) 02/22/2024   HGB 11.1 (L) 02/22/2024   HCT 33.4 (L) 02/22/2024   MCV 97.4 02/22/2024   PLT 779 (H) 02/22/2024   Most recent BMP    Latest Ref Rng & Units 02/22/2024    4:53 AM  BMP  Glucose 70 - 99 mg/dL 84   BUN 8 - 23 mg/dL 8   Creatinine 1.47 - 8.29 mg/dL 5.62   Sodium 130 - 865 mmol/L 139   Potassium 3.5 - 5.1 mmol/L 3.2   Chloride 98 - 111 mmol/L 99   CO2 22 - 32  mmol/L 24   Calcium 8.9 - 10.3 mg/dL 8.9     Tiffany Kocher, DO 02/22/2024, 9:46 AM  PGY-2, [facility] Family Medicine FPTS Intern pager: [phone], text pages welcome Secure chat group [facility] Teaching Service

## 2024-02-22 NOTE — Progress Notes (Signed)
 Patient scoring RED MEWS based on elevated HR, BP and respiratory rate.  Known sepsis with current decline in VS and mental status.  Previously made rapid response RN, Dahlia Client and charge RN, Eduardo Osier aware.  Updated Dr. Nelia Shi by secure message and requested MD rounding.  Vital signs recorded:  02/22/24 1235  Assess: MEWS Score  Temp 98.1 F (36.7 C)  BP (!) 166/88  MAP (mmHg) 111  Pulse Rate (!) 114  Resp (!) 27  SpO2 100 %  O2 Device Room Air  Assess: MEWS Score  MEWS Temp 0  MEWS Systolic 0  MEWS Pulse 2  MEWS RR 2  MEWS LOC 0  MEWS Score 4  MEWS Score Color Red  Assess: if the MEWS score is Yellow or Red  Were vital signs accurate and taken at a resting state? Yes  Does the patient meet 2 or more of the SIRS criteria? Yes  Does the patient have a confirmed or suspected source of infection? Yes  MEWS guidelines implemented  Yes, red  Treat  MEWS Interventions Considered administering scheduled or prn medications/treatments as ordered  Take Vital Signs  Increase Vital Sign Frequency  Red: Q1hr x2, continue Q4hrs until patient remains green for 12hrs  Escalate  MEWS: Escalate Red: Discuss with charge nurse and notify provider. Consider notifying RRT. If remains red for 2 hours consider need for higher level of care  Notify: Charge Nurse/RN  Name of Charge Nurse/RN Notified Eduardo Osier, RN  Provider Notification  Provider Name/Title Dimitry Sharion Dove, MD  Date Provider Notified 02/22/24  Time Provider Notified 1251  Method of Notification Page  Notification Reason Change in status  Provider response En route  Date of Provider Response 02/22/24  Time of Provider Response 1251  Notify: Rapid Response  Name of Rapid Response RN Notified Hannah, RN  Date Rapid Response Notified 02/22/24  Time Rapid Response Notified 1232  Assess: SIRS CRITERIA  SIRS Temperature  0  SIRS Respirations  1  SIRS Pulse 1  SIRS WBC 0  SIRS Score Sum  2   Continuing to  monitor closely.

## 2024-02-22 NOTE — Assessment & Plan Note (Signed)
 Patient alert, oriented to self this morning.  More conversational.  Denies pain. -Improving, continue to avoid sedating medications

## 2024-02-23 DIAGNOSIS — A419 Sepsis, unspecified organism: Secondary | ICD-10-CM | POA: Diagnosis not present

## 2024-02-23 DIAGNOSIS — D75839 Thrombocytosis, unspecified: Secondary | ICD-10-CM | POA: Diagnosis not present

## 2024-02-23 LAB — BASIC METABOLIC PANEL
Anion gap: 17 — ABNORMAL HIGH (ref 5–15)
BUN: 10 mg/dL (ref 8–23)
CO2: 20 mmol/L — ABNORMAL LOW (ref 22–32)
Calcium: 8.6 mg/dL — ABNORMAL LOW (ref 8.9–10.3)
Chloride: 98 mmol/L (ref 98–111)
Creatinine, Ser: 0.69 mg/dL (ref 0.44–1.00)
GFR, Estimated: >60 mL/min (ref >60)
Glucose, Bld: 129 mg/dL — ABNORMAL HIGH (ref 70–99)
Potassium: 3.8 mmol/L (ref 3.5–5.1)
Sodium: 135 mmol/L (ref 135–145)

## 2024-02-23 LAB — CBC
HCT: 32.1 % — ABNORMAL LOW (ref 36.0–46.0)
Hemoglobin: 10.6 g/dL — ABNORMAL LOW (ref 12.0–15.0)
MCH: 31.9 pg (ref 26.0–34.0)
MCHC: 33 g/dL (ref 30.0–36.0)
MCV: 96.7 fL (ref 80.0–100.0)
Platelets: 978 10*3/uL (ref 150–400)
RBC: 3.32 MIL/uL — ABNORMAL LOW (ref 3.87–5.11)
RDW: 13.6 % (ref 11.5–15.5)
WBC: 14 10*3/uL — ABNORMAL HIGH (ref 4.0–10.5)
nRBC: 0 % (ref 0.0–0.2)

## 2024-02-23 LAB — MRSA NEXT GEN BY PCR, NASAL: MRSA by PCR Next Gen: NOT DETECTED

## 2024-02-23 LAB — GLUCOSE, CAPILLARY
Glucose-Capillary: 154 mg/dL — ABNORMAL HIGH (ref 70–99)
Glucose-Capillary: 205 mg/dL — ABNORMAL HIGH (ref 70–99)
Glucose-Capillary: 281 mg/dL — ABNORMAL HIGH (ref 70–99)
Glucose-Capillary: 218 mg/dL — ABNORMAL HIGH (ref 70–99)

## 2024-02-23 LAB — TROPONIN I (HIGH SENSITIVITY): Troponin I (High Sensitivity): 59 ng/L — ABNORMAL HIGH (ref <18)

## 2024-02-23 MED ORDER — ACETAMINOPHEN 325 MG PO TABS
650.0000 mg | ORAL_TABLET | Freq: Four times a day (QID) | ORAL | Status: DC | PRN
  Administered 2024-02-23 – 2024-02-24 (×2): 650 mg via ORAL
  Filled 2024-02-23 (×2): qty 2

## 2024-02-23 NOTE — Assessment & Plan Note (Addendum)
 Has remained afebrile.  Blood culture shows no growth and over 48 hours.   -Continue vancomycin, cefepime, Flagyl; likely narrow and transition to PO 3/3 -Follow-up blood cultures -Continuous cardiac monitoring x 24 hours with continuous pulse ox

## 2024-02-23 NOTE — Assessment & Plan Note (Addendum)
 Improving p.o. intake, will consider slowly restarting basal insulin. - CBG monitoring, sliding scale - Add back basal insulin as needed

## 2024-02-23 NOTE — Progress Notes (Addendum)
 Date and time results received: 02/23/24 1131   Test: PLATELETS Critical Value: 978  Name of Provider Notified: DR Mliss Sax  Orders Received? Or Actions Taken?: NO NEW ORDERS RECEIVED

## 2024-02-23 NOTE — Evaluation (Addendum)
 Physical Therapy Evaluation Patient Details Name: Judith [name] MRN: [id number] DOB: [birth year] Today's Date: 02/23/2024  History of Present Illness  Pt is an 83 y.o. female admitted 2/28 with AMS and sepsis from [facility], where she has been for rehab since Feb 2025 admission. PMH:  DM2, diabetic neuropathy, OA, GERD, HTN, HLD, gout, osteoporosis, sleep apnea, cognitive deficits   Clinical Impression  Pt admitted with above diagnosis. PTA pt at [facility] for rehab. Per chart, pt ambulatory with rollator short distances. Pt currently with functional limitations due to the deficits listed below (see PT Problem List). On eval, pt required total assist bed mobility and demo zero sitting balance EOB. Pt in fetal/flexed position in bed in R sidelying. She maintained that position during transition to/from EOB. Pt resisting BLE ROM into extension and moaning in pain. No purposeful interaction with therapist. Lethargic with eyes open on/off. Returned to R sidelying with pillows in place for comfort/pressure relief BLE. Pt will benefit from acute skilled PT to increase their independence and safety with mobility to allow discharge. Upon d/c, recommend return to SNF.        If plan is discharge home, recommend the following: Two people to help with walking and/or transfers;Two people to help with bathing/dressing/bathroom   Can travel by private vehicle   No    Equipment Recommendations Wheelchair (measurements PT);Wheelchair cushion (measurements PT);Hospital bed  Recommendations for Other Services       Functional Status Assessment Patient has had a recent decline in their functional status and/or demonstrates limited ability to make significant improvements in function in a reasonable and predictable amount of time     Precautions / Restrictions Precautions Precautions: Fall Recall of Precautions/Restrictions: Impaired      Mobility  Bed Mobility Overal bed mobility: Needs  Assistance Bed Mobility: Sidelying to Sit, Sit to Sidelying   Sidelying to sit: Total assist     Sit to sidelying: Total assist General bed mobility comments: Pt maintaining flexed position during transition to/from EOB. Unable to sustain sitting position due to pain.    Transfers                        Ambulation/Gait                  Stairs            Wheelchair Mobility     Tilt Bed    Modified Rankin (Stroke Patients Only)       Balance Overall balance assessment: History of Falls, Needs assistance Sitting-balance support: Feet unsupported, No upper extremity supported Sitting balance-Leahy Scale: Zero                                       Pertinent Vitals/Pain Pain Assessment Pain Assessment: Faces Faces Pain Scale: Hurts whole lot Pain Location: BLE with ROM/mobility Pain Descriptors / Indicators: Grimacing, Guarding, Discomfort, Moaning Pain Intervention(s): Limited activity within patient's tolerance, Monitored during session, Repositioned    Home Living Family/patient expects to be discharged to:: Skilled nursing facility                 Home Equipment: Rollator (4 wheels);Rolling Walker (2 wheels) Additional Comments: assist from SNF staff    Prior Function Prior Level of Function : Needs assist             Mobility  Comments: amb with rollator at baseline (at ALF) and during recent hospitalization (02/10/24). Unsure of recent mobility status at SNF. ADLs Comments: assist from SNF staff     Extremity/Trunk Assessment   Upper Extremity Assessment Upper Extremity Assessment: Defer to OT evaluation    Lower Extremity Assessment Lower Extremity Assessment: RLE deficits/detail;LLE deficits/detail RLE Deficits / Details: Pt postioned in fetal/flexed position. Resisting ROM into flexion, moaning in pain RLE: Unable to fully assess due to pain LLE Deficits / Details: Pt postioned in fetal/flexed  position. Resisting ROM into flexion, moaning in pain LLE: Unable to fully assess due to pain    Cervical / Trunk Assessment Cervical / Trunk Assessment: Kyphotic  Communication   Communication Communication: Impaired Factors Affecting Communication: Difficulty expressing self;Reduced clarity of speech    Cognition Arousal: Lethargic Behavior During Therapy: Flat affect   PT - Cognitive impairments: No family/caregiver present to determine baseline                       PT - Cognition Comments: No spoken words. Moaning in response to pain. Eyes open on and off. No purposeful interaction with therapist. Following commands: Impaired Following commands impaired: Follows one step commands inconsistently     Cueing       General Comments General comments (skin integrity, edema, etc.): VSS on RA    Exercises     Assessment/Plan    PT Assessment Patient needs continued PT services  PT Problem List Decreased strength;Decreased activity tolerance;Decreased balance;Decreased mobility;Decreased cognition;Decreased safety awareness;Pain;Decreased range of motion       PT Treatment Interventions DME instruction;Gait training;Functional mobility training;Therapeutic activities;Therapeutic exercise;Balance training;Patient/family education    PT Goals (Current goals can be found in the Care Plan section)  Acute Rehab PT Goals Patient Stated Goal: unable to state PT Goal Formulation: Patient unable to participate in goal setting Time For Goal Achievement: 03/08/24 Potential to Achieve Goals: Poor    Frequency Min 1X/week     Co-evaluation               AM-PAC PT "6 Clicks" Mobility  Outcome Measure Help needed turning from your back to your side while in a flat bed without using bedrails?: Total Help needed moving from lying on your back to sitting on the side of a flat bed without using bedrails?: Total Help needed moving to and from a bed to a chair  (including a wheelchair)?: Total Help needed standing up from a chair using your arms (e.g., wheelchair or bedside chair)?: Total Help needed to walk in hospital room?: Total Help needed climbing 3-5 steps with a railing? : Total 6 Click Score: 6    End of Session   Activity Tolerance: Patient limited by pain;Patient limited by fatigue Patient left: in bed;with call bell/phone within reach;with bed alarm set Nurse Communication: Mobility status PT Visit Diagnosis: Other abnormalities of gait and mobility (R26.89);Muscle weakness (generalized) (M62.81)    Time: 1914-7829 PT Time Calculation (min) (ACUTE ONLY): 16 min   Charges:   PT Evaluation $PT Eval Moderate Complexity: 1 Mod   PT General Charges $$ ACUTE PT VISIT: 1 Visit         Ferd Glassing., PT  Office # [phone]   Ilda Foil 02/23/2024, 11:05 AM

## 2024-02-23 NOTE — Assessment & Plan Note (Addendum)
 Patient alert, oriented to self this morning.  More conversational.  Denies pain. -Improving, continue to avoid sedating medications

## 2024-02-23 NOTE — Plan of Care (Signed)
 FMTS Brief Progress Note  S:Evaluated pt at bedside with Dr. Velna Ochs. Complains of bilateral knee pain, states she has not received tylenol yet. No other concerns voiced at this time.    O: BP (!) 146/87 (BP Location: Left Arm)   Pulse (!) 104   Temp 98.1 F (36.7 C) (Axillary)   Resp 19   Ht 5\' 3"  (1.6 m)   Wt 47.6 kg   SpO2 100%   BMI 18.60 kg/m   Gen: resting comfortably, NAD CV: RRR, no m/r/g MSK: Will not allow [place] to straighten her legs. No erythema, warmth, ecchymosis over bilateral knees. Neuro: A&O to person and place.   A/P: Sepsis of unknown etiology - continue IV vanc, cefepime consider transition to PO abx in AM - received one dose of Flagyl 02/21/24  Rest of plan per day team  - Orders reviewed. Labs for AM (CBC, BMP) ordered, which was adjusted as needed.  - If condition changes, plan includes broadening abx.   Para March, DO 02/23/2024, 8:44 PM PGY-1,  Family Medicine Night Resident  Please page [phone] with questions.

## 2024-02-23 NOTE — Assessment & Plan Note (Addendum)
 Suspect acute phase reactant. -Monitor CBC

## 2024-02-23 NOTE — Plan of Care (Signed)
  Problem: Health Behavior/Discharge Planning: Goal: Ability to manage health-related needs will improve Outcome: Progressing   Problem: Clinical Measurements: Goal: Respiratory complications will improve Outcome: Progressing Goal: Cardiovascular complication will be avoided Outcome: Progressing   Problem: Coping: Goal: Level of anxiety will decrease Outcome: Progressing   Problem: Safety: Goal: Ability to remain free from injury will improve Outcome: Progressing   Problem: Fluid Volume: Goal: Hemodynamic stability will improve Outcome: Progressing   Problem: Respiratory: Goal: Ability to maintain adequate ventilation will improve Outcome: Progressing   Problem: Metabolic: Goal: Ability to maintain appropriate glucose levels will improve Outcome: Progressing

## 2024-02-23 NOTE — Progress Notes (Addendum)
     Daily Progress Note Intern Pager: [phone]  Patient name: Judith [name] Medical record number: [id number] Date of birth: 05-Apr-1941 Age: 83 y.o. Gender: female  Primary Care Provider: Darral Dash, DO Consultants: None Code Status: DNR-Limited  Pt Overview and Major Events to Date:  2/28: Admitted  Assessment and Plan: Judith [name] is a 83 year old female presenting with sepsis and AMS. Pertinent PMH/PSH includes T2DM, CAD s/p CABG, HTN, complete heart block with pacemaker..  Assessment & Plan Sepsis (HCC) Has remained afebrile.  Blood culture shows no growth and over 48 hours.   -Continue vancomycin, cefepime, Flagyl; likely narrow and transition to PO 3/3 -Follow-up blood cultures -Continuous cardiac monitoring x 24 hours with continuous pulse ox Thrombocytosis  Suspect acute phase reactant. -Monitor CBC Confusion Patient alert, oriented to self this morning.  More conversational.  Denies pain. -Improving, continue to avoid sedating medications  Controlled type 2 diabetes mellitus with diabetic neuropathy, with long-term current use of insulin (HCC) Improving p.o. intake, will consider slowly restarting basal insulin. - CBG monitoring, sliding scale - Add back basal insulin as needed    Chronic and Stable Problems:  HTN: no medications currently HLD: hold Crestor 10 mg daily DDD: Chronic issue. Manage any pain with Tylenol and Voltaren gel at this time.  Peripheral neuropathy: On Gabapentin 400 mg at bedtime, restart when able Depression: Hold Cymbalta 30 mg daily  Complete Heart Block with pacemaker: No hx of echo. Will order. PPM interrogation.  Pulmonary nodules: Incidental 3 mm left upper pulmonary nodule found 05/2023 will monitor with low dose CT in 6-12 months, will put in follow up instructions in discharge summary  Gastroparesis: Hx of partial gastrectomy  Depression: hold sertraline 25mg  daily, mirtazapine 7.5mg  daily   FEN/GI: Dysphagia 1, thin  fluids PPx: Lovenox Dispo:SNF pending clinical improvement .   Subjective:  Laying comfortably in bed and not in any acute distress.  Patient able to identify self but not place or situation.  Objective: Temp:  [97.6 F (36.4 C)-98.5 F (36.9 C)] 98.4 F (36.9 C) (03/02 0737) Pulse Rate:  [94-115] 94 (03/02 0737) Resp:  [15-27] 16 (03/02 0737) BP: (122-168)/(69-91) 147/91 (03/02 0737) SpO2:  [98 %-100 %] 98 % (03/02 0737) Physical Exam: General: Frail, nontoxic appearing, NAD Cardiovascular: RRR, no murmurs Respiratory: Normal WOB on RA, CTAB Abdomen: Soft, no distention or tenderness Extremities: No BLE edema Neuro: Only oriented to person.  No focal deficits  Laboratory: Most recent CBC Lab Results  Component Value Date   WBC 11.4 (H) 02/22/2024   HGB 11.1 (L) 02/22/2024   HCT 33.4 (L) 02/22/2024   MCV 97.4 02/22/2024   PLT 779 (H) 02/22/2024   Most recent BMP    Latest Ref Rng & Units 02/23/2024    4:07 AM  BMP  Glucose 70 - 99 mg/dL 147   BUN 8 - 23 mg/dL 10   Creatinine 8.29 - 1.00 mg/dL 5.62   Sodium 130 - 865 mmol/L 135   Potassium 3.5 - 5.1 mmol/L 3.8   Chloride 98 - 111 mmol/L 98   CO2 22 - 32 mmol/L 20   Calcium 8.9 - 10.3 mg/dL 8.6     Imaging/Diagnostic Tests: Chest CT: Negative for PE  Checks x-ray: Negative for pleural effusion or infiltrates.   Jerre Simon, MD 02/23/2024, 7:56 AM  PGY-3, [facility] Family Medicine FPTS Intern pager: [phone], text pages welcome Secure chat group [facility] Teaching Service

## 2024-02-24 DIAGNOSIS — E876 Hypokalemia: Secondary | ICD-10-CM

## 2024-02-24 DIAGNOSIS — D75839 Thrombocytosis, unspecified: Secondary | ICD-10-CM | POA: Diagnosis not present

## 2024-02-24 DIAGNOSIS — A419 Sepsis, unspecified organism: Secondary | ICD-10-CM | POA: Diagnosis not present

## 2024-02-24 DIAGNOSIS — M79606 Pain in leg, unspecified: Secondary | ICD-10-CM | POA: Insufficient documentation

## 2024-02-24 LAB — BASIC METABOLIC PANEL
Anion gap: 6 (ref 5–15)
Anion gap: 14 (ref 5–15)
BUN: 17 mg/dL (ref 8–23)
BUN: 19 mg/dL (ref 8–23)
CO2: 27 mmol/L (ref 22–32)
CO2: 21 mmol/L — ABNORMAL LOW (ref 22–32)
Calcium: 8 mg/dL — ABNORMAL LOW (ref 8.9–10.3)
Calcium: 8.5 mg/dL — ABNORMAL LOW (ref 8.9–10.3)
Chloride: 103 mmol/L (ref 98–111)
Chloride: 103 mmol/L (ref 98–111)
Creatinine, Ser: 0.65 mg/dL (ref 0.44–1.00)
Creatinine, Ser: 0.77 mg/dL (ref 0.44–1.00)
GFR, Estimated: >60 mL/min (ref >60)
GFR, Estimated: >60 mL/min (ref >60)
Glucose, Bld: 177 mg/dL — ABNORMAL HIGH (ref 70–99)
Glucose, Bld: 222 mg/dL — ABNORMAL HIGH (ref 70–99)
Potassium: 2.8 mmol/L — ABNORMAL LOW (ref 3.5–5.1)
Potassium: 4.4 mmol/L (ref 3.5–5.1)
Sodium: 136 mmol/L (ref 135–145)
Sodium: 138 mmol/L (ref 135–145)

## 2024-02-24 LAB — GLUCOSE, CAPILLARY
Glucose-Capillary: 162 mg/dL — ABNORMAL HIGH (ref 70–99)
Glucose-Capillary: 269 mg/dL — ABNORMAL HIGH (ref 70–99)
Glucose-Capillary: 237 mg/dL — ABNORMAL HIGH (ref 70–99)
Glucose-Capillary: 328 mg/dL — ABNORMAL HIGH (ref 70–99)

## 2024-02-24 LAB — CBC
HCT: 28.5 % — ABNORMAL LOW (ref 36.0–46.0)
Hemoglobin: 9.4 g/dL — ABNORMAL LOW (ref 12.0–15.0)
MCH: 32.2 pg (ref 26.0–34.0)
MCHC: 33 g/dL (ref 30.0–36.0)
MCV: 97.6 fL (ref 80.0–100.0)
Platelets: 703 10*3/uL — ABNORMAL HIGH (ref 150–400)
RBC: 2.92 MIL/uL — ABNORMAL LOW (ref 3.87–5.11)
RDW: 13.5 % (ref 11.5–15.5)
WBC: 9.5 10*3/uL (ref 4.0–10.5)
nRBC: 0 % (ref 0.0–0.2)

## 2024-02-24 MED ORDER — AMOXICILLIN-POT CLAVULANATE 875-125 MG PO TABS
1.0000 | ORAL_TABLET | Freq: Two times a day (BID) | ORAL | Status: AC
  Administered 2024-02-24 – 2024-02-27 (×7): 1 via ORAL
  Filled 2024-02-24 (×8): qty 1

## 2024-02-24 MED ORDER — ACETAMINOPHEN 325 MG PO TABS
650.0000 mg | ORAL_TABLET | Freq: Four times a day (QID) | ORAL | Status: DC
  Administered 2024-02-24 – 2024-03-03 (×28): 650 mg via ORAL
  Filled 2024-02-24 (×28): qty 2

## 2024-02-24 MED ORDER — INSULIN ASPART 100 UNIT/ML IJ SOLN
0.0000 [IU] | Freq: Every day | INTRAMUSCULAR | Status: DC
  Administered 2024-02-24: 4 [IU] via SUBCUTANEOUS
  Administered 2024-02-27 – 2024-03-01 (×3): 2 [IU] via SUBCUTANEOUS

## 2024-02-24 MED ORDER — OXYCODONE HCL 5 MG PO TABS
2.5000 mg | ORAL_TABLET | Freq: Two times a day (BID) | ORAL | Status: DC | PRN
  Administered 2024-02-24 – 2024-02-29 (×9): 2.5 mg via ORAL
  Filled 2024-02-24 (×10): qty 1

## 2024-02-24 MED ORDER — ORAL CARE MOUTH RINSE: 15.0000 mL | OROMUCOSAL | Status: DC | PRN

## 2024-02-24 MED ORDER — POTASSIUM CHLORIDE CRYS ER 20 MEQ PO TBCR
40.0000 meq | EXTENDED_RELEASE_TABLET | ORAL | Status: AC
Start: 2024-02-24 — End: 2024-02-24
  Administered 2024-02-24 (×2): 40 meq via ORAL
  Filled 2024-02-24 (×2): qty 2

## 2024-02-24 MED ORDER — ORAL CARE MOUTH RINSE
15.0000 mL | OROMUCOSAL | Status: DC
  Administered 2024-02-24 – 2024-03-03 (×29): 15 mL via OROMUCOSAL

## 2024-02-24 NOTE — Assessment & Plan Note (Signed)
 Potassium 2.8 this morning.  Repleted by night team with 40 mEq x 2. - Recheck BMP this afternoon - AM BMP

## 2024-02-24 NOTE — Plan of Care (Signed)
   Problem: Education: Goal: Knowledge of General Education information will improve Description Including pain rating scale, medication(s)/side effects and non-pharmacologic comfort measures Outcome: Progressing

## 2024-02-24 NOTE — Assessment & Plan Note (Addendum)
 Suspect acute phase reactant.  Platelets continue to normalize. -Monitor CBC

## 2024-02-24 NOTE — TOC Initial Note (Signed)
 Transition of Care [facility]) - Initial/Assessment Note    Patient Details  Name: Judith [name] MRN: [id number] Date of Birth: November 21, 1941  Transition of Care [facility]) CM/SW Contact:    Delilah Shan, LCSWA Phone Number: 02/24/2024, 11:33 AM  Clinical Narrative:                  CSW received consult for possible SNF placement at time of discharge. Due to patients current orientation  CSW spoke with patients daughter [name] regarding PT recommendation of SNF placement at time of discharge. Patients daughter reports PTA patient comes from [facility] short term.Patients daughter expressed understanding of PT recommendation and confirmed plan is for patient to return to [facility] for short term rehab when medically ready for dc.  CSW discussed insurance authorization process. No further questions reported at this time. CSW to continue to follow and assist with discharge planning needs.    Expected Discharge Plan: Skilled Nursing Facility Barriers to Discharge: Continued Medical Work up   Patient Goals and CMS Choice Patient states their goals for this hospitalization and ongoing recovery are:: SNF   Choice offered to / list presented to : Adult Children (Patients daughter [name])      Expected Discharge Plan and Services In-house Referral: Clinical Social Work     Living arrangements for the past 2 months:  (From [employer] term)                                      Prior Living Arrangements/Services Living arrangements for the past 2 months:  (From [facility] Short term) Lives with:: Investment banker, operational (Lives at [facility]) Patient language and need for interpreter reviewed:: Yes Do you feel safe going back to the place where you live?: No   SNF  Need for Family Participation in Patient Care: Yes (Comment) Care giver support system in place?: Yes (comment)   Criminal Activity/Legal Involvement Pertinent to Current Situation/Hospitalization: No - Comment as  needed  Activities of Daily Living      Permission Sought/Granted Permission sought to share information with : Case Manager, Family Supports, Magazine features editor                Emotional Assessment       Orientation: : Oriented to Self, Oriented to Place Alcohol / Substance Use: Not Applicable Psych Involvement: No (comment)  Admission diagnosis:  Thrombocytosis [D75.839] Sepsis (HCC) [A41.9] Fatigue, unspecified type [R53.83] Sepsis, due to unspecified organism, unspecified whether acute organ dysfunction present [facility]) [A41.9] Patient Active Problem List   Diagnosis Date Noted   Sepsis (HCC) 02/21/2024   Thrombocytosis 02/21/2024   Hyperglycemia 02/07/2024   Elevated troponin  Demand Ischemia 02/07/2024   Demand ischemia (HCC) 02/07/2024   Acute cystitis without hematuria 02/03/2024   Generalized weakness 02/02/2024   Acute cystitis with hematuria 01/30/2024   Confusion 01/29/2024   Protein-calorie malnutrition, severe 01/27/2024   Fall 01/25/2024   Esophageal abnormality 01/25/2024   Knee abrasion 01/25/2024   Frequent falls 01/25/2024   Goals of care, counseling/discussion 12/05/2023   Depressed mood 11/12/2023   Suicidal ideation 10/29/2023   Pulmonary nodule 06/17/2023   Pacemaker battery depletion 04/25/2023   Urinary incontinence 08/22/2022   Sleep apnea    Hyperlipidemia    Polycythemia    Unspecified inflammatory spondylopathy, cervical region (HCC) 11/15/2020   Multilevel degenerative disc disease 04/03/2018   Acute blood loss anemia  01/17/2018   Unintentional weight loss 01/17/2018   S/P partial gastrectomy    Peripheral neuropathy    Benign essential HTN    Controlled type 2 diabetes mellitus with diabetic neuropathy, with long-term current use of insulin (HCC)    Depression    Presence of cardiac pacemaker    Gout    Cervical spine arthritis 04/05/2016   Long-term insulin use in type 2 diabetes (HCC) 04/05/2016   PCP:   Darral Dash, DO Pharmacy:   [facility] [place], [place] - [address] [place] [address] Phone: [phone] Fax: [phone]  [id number] [address] [facility] [address] [place] [address] Phone: [phone] Fax: [phone]  [facility] [address] [place] [address] Phone: [phone] Fax: [phone]     Social Drivers of Health (SDOH) Social History: SDOH Screenings   Food Insecurity: Patient Unable To Answer (02/23/2024)  Housing: Patient Unable To Answer (02/23/2024)  Transportation Needs: Patient Unable To Answer (02/23/2024)  Utilities: Not At Risk (02/23/2024)  Alcohol Screen: Low Risk  (09/27/2023)  Depression (PHQ2-9): Low Risk  (12/02/2023)  Financial Resource Strain: Low Risk  (10/19/2023)  Physical Activity: Unknown (10/19/2023)  Recent Concern: Physical Activity - Inactive (09/27/2023)  Social Connections: Unknown (02/23/2024)  Recent Concern: Social Connections - Socially Isolated (02/07/2024)  Stress: Patient Declined (10/19/2023)  Tobacco Use: Low Risk  (02/21/2024)  Health Literacy: Adequate Health Literacy (09/27/2023)   SDOH Interventions:     Readmission Risk Interventions    02/11/2024   12:09 PM  Readmission Risk Prevention Plan  Transportation Screening Complete  Social Work Consult for Recovery Care Planning/Counseling Complete  Palliative Care Screening Complete  Medication Review Oceanographer) Complete

## 2024-02-24 NOTE — Progress Notes (Signed)
 OT Cancellation Note  Patient Details Name: Judith [name] MRN: [id number] DOB: December 26, 1940   Cancelled Treatment:    Reason Eval/Treat Not Completed: Fatigue/lethargy limiting ability to participate;Other (comment) (RN requests for OT to hold today due to pt just being able to get some sleep as she has not been able to, pt has been restless and agitated)  Galen Manila 02/24/2024, 1:55 PM

## 2024-02-24 NOTE — Assessment & Plan Note (Addendum)
 Improving p.o. intake, will consider slowly restarting basal insulin. - CBG monitoring, sliding scale - Add back basal insulin as needed

## 2024-02-24 NOTE — Progress Notes (Signed)
 Speech Language Pathology Treatment: Dysphagia  Patient Details Name: Judith [name] MRN: [id number] DOB: [birth year] Today's Date: 02/24/2024 Time: 1040-1050 SLP Time Calculation (min) (ACUTE ONLY): 10 min  Assessment / Plan / Recommendation Clinical Impression  F/u for dysphagia after 3/1 assessment. Pt shows no s/s of aspiration - today she reported discomfort and did not want HOB elevated. She drank 4 oz of water from a straw in a reclined position without any difficulty. She declined offers of solid foods in an effort to advance diet from purees. When asked about advancing diet, she stated no.  I doubt presence of dysphagia - issues appear to be related to appetite, MS.   Recommend that she continue on pureed diet while admitted.  F/u at SNF to advance diet per her preferences. No further acute SLP needs. Our service will sign off.   HPI HPI: Pt is a 83 y.o. female presenting with sepsis and AMS.  Additionally presented with thrombocytosis. CT head and CXR both negative for acute findings or active disease. Pt has been seen by SLP for swallow eval on two previous admissions. On both 01/25/24 and 02/08/24, pt without s/sx of aspiration with POs even when challenged. Dys 3 diet/thin liquids recommended due to pt preference. PMH: T2DM, Depression, HTN, HLD, Pacemaker placed for complete heart block , Peripheral neuropathy, DDD, CAD s/p CABG.      SLP Plan  All goals met      Recommendations for follow up therapy are one component of a multi-disciplinary discharge planning process, led by the attending physician.  Recommendations may be updated based on patient status, additional functional criteria and insurance authorization.    Recommendations  Diet recommendations: Dysphagia 1 (puree);Thin liquid Liquids provided via: Cup;Straw Medication Administration: Whole meds with puree Supervision: Staff to assist with self feeding Compensations: Minimize environmental distractions;Slow rate;Small  sips/bites                  Oral care BID   Frequent or constant Supervision/Assistance Dysphagia, unspecified (R13.10)     All goals met    Judith [name] L. Judith Frederic, MA CCC/SLP Clinical Specialist - Acute Care SLP Acute Rehabilitation Services Office number [phone]  Judith [name] Judith [name]  02/24/2024, 12:32 PM

## 2024-02-24 NOTE — Assessment & Plan Note (Addendum)
 Has remained afebrile.  Blood culture shows no growth and over 72 hours.  S/p 4 days of antibiotic coverage with vancomycin, cefepime. -Discontinue vancomycin, cefepime - Start p.o. Augmentin tonight, and continue for 7 total doses. Total of 7 days of abx coverage (3/1-3/7) -Follow-up blood cultures -Cardiac monitoring discontinued as patient has been stable, downgrade from progressive to MedSurg.

## 2024-02-24 NOTE — Assessment & Plan Note (Addendum)
 Patient continues to have waxing and waning orientation.  Reports pain as below. -Improving, continue to avoid sedating medications or possible. - Will add back home oxycodone as below

## 2024-02-24 NOTE — Progress Notes (Signed)
 Daily Progress Note Intern Pager: [phone]  Patient name: Judith [name] Medical record number: [id number] Date of birth: 02/18/1941 Age: 83 y.o. Gender: female  Primary Care Provider: Darral Dash, DO Consultants: None Code Status: DNR-limited  Pt Overview and Major Events to Date:  2/28: Admitted  Assessment and Plan: Judith [name] is a 83 year old female presented with sepsis and AMS. Pertinent PMH/PSH includes T2DM, CAD s/p CABG, HTN, complete heart block with pacemaker.  VSS, afebrile.  Continuous cardiac monitoring discontinued and patient downgraded to MedSurg.  Assessment & Plan Sepsis (HCC) Has remained afebrile.  Blood culture shows no growth and over 72 hours.  S/p 4 days of antibiotic coverage with vancomycin, cefepime. -Discontinue vancomycin, cefepime - Start p.o. Augmentin tonight, and continue for 7 total doses. Total of 7 days of abx coverage (3/1-3/7) -Follow-up blood cultures -Cardiac monitoring discontinued as patient has been stable, downgrade from progressive to MedSurg. Thrombocytosis  Suspect acute phase reactant.  Platelets continue to normalize. -Monitor CBC Confusion Patient continues to have waxing and waning orientation.  Reports pain as below. -Improving, continue to avoid sedating medications or possible. - Will add back home oxycodone as below  Controlled type 2 diabetes mellitus with diabetic neuropathy, with long-term current use of insulin (HCC) Improving p.o. intake, will consider slowly restarting basal insulin. - CBG monitoring, sliding scale - Add back basal insulin as needed Leg pain Patient frequently tearful and crying out for help.  Has been receiving Tylenol as needed. - Tylenol now to be scheduled every 6 hours - Home oxycodone 2.5 mg twice daily as needed for breakthrough pain Hypokalemia Potassium 2.8 this morning.  Repleted by night team with 40 mEq x 2. - Recheck BMP this afternoon - AM BMP    Chronic and Stable  Problems:  HTN: no medications currently HLD: hold Crestor 10 mg daily DDD: Chronic issue. Manage any pain with Tylenol and Voltaren gel at this time.  Peripheral neuropathy: On Gabapentin 400 mg at bedtime, restart when able Depression: Hold Cymbalta 30 mg daily  Complete Heart Block with pacemaker: No hx of echo. Will order. PPM interrogation.  Pulmonary nodules: Incidental 3 mm left upper pulmonary nodule found 05/2023 will monitor with low dose CT in 6-12 months, will put in follow up instructions in discharge summary  Gastroparesis: Hx of partial gastrectomy  Depression: hold sertraline 25mg  daily, mirtazapine 7.5mg  daily  FEN/GI: Dysphagia 1, thin fluids PPx: Lovenox Dispo:SNF pending clinical improvement . Barriers include transition to p.o. antibiotics.   Subjective:  Patient tearful, lying in bed.  Asks for help, reports severe leg pain.  Denies any other problems.  Objective: Temp:  [97.4 F (36.3 C)-98.1 F (36.7 C)] 97.4 F (36.3 C) (03/03 0755) Pulse Rate:  [92] 92 (03/03 0755) Resp:  [19] 19 (03/03 0755) BP: (146-149)/(72-87) 149/72 (03/03 0755) SpO2:  [100 %] 100 % (03/03 0755) Physical Exam: General: Tearful, uncomfortable appearing Cardiovascular: RRR Respiratory: CTA bilaterally Extremities: Skin warm and dry.  No LE edema.  Laboratory: Most recent CBC Lab Results  Component Value Date   WBC 9.5 02/24/2024   HGB 9.4 (L) 02/24/2024   HCT 28.5 (L) 02/24/2024   MCV 97.6 02/24/2024   PLT 703 (H) 02/24/2024   Most recent BMP    Latest Ref Rng & Units 02/24/2024    4:55 AM  BMP  Glucose 70 - 99 mg/dL 295   BUN 8 - 23 mg/dL 17   Creatinine 2.84 - 1.00 mg/dL 1.32   Sodium  135 - 145 mmol/L 136   Potassium 3.5 - 5.1 mmol/L 2.8   Chloride 98 - 111 mmol/L 103   CO2 22 - 32 mmol/L 27   Calcium 8.9 - 10.3 mg/dL 8.0     Cyndia Skeeters, DO 02/24/2024, 2:19 PM  PGY-1, [facility] Family Medicine FPTS Intern pager: [phone], text pages welcome Secure chat  group [facility] Teaching Service

## 2024-02-24 NOTE — [place] FL2 (Signed)
 [place] MEDICAID FL2 LEVEL OF CARE FORM     IDENTIFICATION  Patient Name: Judith [name] Birthdate: 12-19-1941 Sex: female Admission Date (Current Location): 02/21/2024  [facility] and [place] Number:  Producer, television/film/video and Address:  [facility] [place], [address], [place], [place] [address]      Provider Number: [id number]  Attending Physician Name and Address:  Doreene Eland, MD  Relative Name and Phone Number:  [name] ([phone]    Current Level of Care: Hospital Recommended Level of Care: Skilled Nursing Facility Prior Approval Number:    Date Approved/Denied:   PASRR Number: [id number]  Discharge Plan: SNF    Current Diagnoses: Patient Active Problem List   Diagnosis Date Noted   Sepsis (HCC) 02/21/2024   Thrombocytosis 02/21/2024   Hyperglycemia 02/07/2024   Elevated troponin  Demand Ischemia 02/07/2024   Demand ischemia (HCC) 02/07/2024   Acute cystitis without hematuria 02/03/2024   Generalized weakness 02/02/2024   Acute cystitis with hematuria 01/30/2024   Confusion 01/29/2024   Protein-calorie malnutrition, severe 01/27/2024   Fall 01/25/2024   Esophageal abnormality 01/25/2024   Knee abrasion 01/25/2024   Frequent falls 01/25/2024   Goals of care, counseling/discussion 12/05/2023   Depressed mood 11/12/2023   Suicidal ideation 10/29/2023   Pulmonary nodule 06/17/2023   Pacemaker battery depletion 04/25/2023   Urinary incontinence 08/22/2022   Sleep apnea    Hyperlipidemia    Polycythemia    Unspecified inflammatory spondylopathy, cervical region (HCC) 11/15/2020   Multilevel degenerative disc disease 04/03/2018   Acute blood loss anemia 01/17/2018   Unintentional weight loss 01/17/2018   S/P partial gastrectomy    Peripheral neuropathy    Benign essential HTN    Controlled type 2 diabetes mellitus with diabetic neuropathy, with long-term current use of insulin (HCC)    Depression    Presence of  cardiac pacemaker    Gout    Cervical spine arthritis 04/05/2016   Long-term insulin use in type 2 diabetes (HCC) 04/05/2016    Orientation RESPIRATION BLADDER Height & Weight     Self, Place  Normal Incontinent Weight: 105 lb (47.6 kg) Height:  5\' 3"  (160 cm)  BEHAVIORAL SYMPTOMS/MOOD NEUROLOGICAL BOWEL NUTRITION STATUS      Incontinent Diet (Please see discharge summary)  AMBULATORY STATUS COMMUNICATION OF NEEDS Skin   Extensive Assist Verbally Other (Comment) (Abrasion,knee,R,Ecchymosis,arm,bil.,Wound/Incision LDAs,PI heel,L,stage 2)                       Personal Care Assistance Level of Assistance    Bathing Assistance: Limited assistance Feeding assistance: Limited assistance Dressing Assistance: Limited assistance     Functional Limitations Info  Sight, Hearing, Speech Sight Info: Adequate Hearing Info: Adequate Speech Info: Adequate    SPECIAL CARE FACTORS FREQUENCY  PT (By licensed PT), OT (By licensed OT)     PT Frequency: 5x min weekly OT Frequency: 5x min weekly            Contractures Contractures Info: Not present    Additional Factors Info  Code Status, Allergies, Insulin Sliding Scale Code Status Info: DNR Allergies Info: Asa (aspirin),Motrin (ibuprofen),Cipro (ciprofloxacin Hcl),Sulfa Antibiotics Psychotropic Info: DULoxetine (CYMBALTA) DR capsule 30 mg daily Insulin Sliding Scale Info: insulin aspart (novoLOG) injection 0-9 Units 3 times daily with meals       Current Medications (02/24/2024):  This is the current hospital active medication list Current Facility-Administered Medications  Medication Dose Route Frequency Provider Last Rate Last Admin  acetaminophen (TYLENOL) tablet 650 mg  650 mg Oral Q6H Cyndia Skeeters, DO   650 mg at 02/24/24 1227   amoxicillin-clavulanate (AUGMENTIN) 875-125 MG per tablet 1 tablet  1 tablet Oral Q12H Shitarev, Dimitry, MD       diclofenac Sodium (VOLTAREN) 1 % topical gel 2 g  2 g Topical QID PRN Shelby Mattocks, DO   2 g at 02/24/24 1210   DULoxetine (CYMBALTA) DR capsule 30 mg  30 mg Oral Daily Tiffany Kocher, DO   30 mg at 02/24/24 1101   enoxaparin (LOVENOX) injection 40 mg  40 mg Subcutaneous Q24H Shelby Mattocks, DO   40 mg at 02/24/24 1102   famotidine (PEPCID) tablet 20 mg  20 mg Oral QHS Tiffany Kocher, DO   20 mg at 02/23/24 2313   gabapentin (NEURONTIN) capsule 400 mg  400 mg Oral QHS Tiffany Kocher, DO   400 mg at 02/23/24 2313   insulin aspart (novoLOG) injection 0-9 Units  0-9 Units Subcutaneous TID WC Shelby Mattocks, DO   5 Units at 02/24/24 1214   oxyCODONE (Oxy IR/ROXICODONE) immediate release tablet 2.5 mg  2.5 mg Oral BID PRN Cyndia Skeeters, DO   2.5 mg at 02/24/24 1227   rosuvastatin (CRESTOR) tablet 10 mg  10 mg Oral Daily Tiffany Kocher, DO   10 mg at 02/24/24 1101     Discharge Medications: Please see discharge summary for a list of discharge medications.  Relevant Imaging Results:  Relevant Lab Results:   Additional Information [id number]  Delilah Shan, LCSWA

## 2024-02-24 NOTE — Assessment & Plan Note (Signed)
 Patient frequently tearful and crying out for help.  Has been receiving Tylenol as needed. - Tylenol now to be scheduled every 6 hours - Home oxycodone 2.5 mg twice daily as needed for breakthrough pain

## 2024-02-25 DIAGNOSIS — A419 Sepsis, unspecified organism: Secondary | ICD-10-CM | POA: Diagnosis not present

## 2024-02-25 LAB — BASIC METABOLIC PANEL
Anion gap: 11 (ref 5–15)
BUN: 19 mg/dL (ref 8–23)
CO2: 19 mmol/L — ABNORMAL LOW (ref 22–32)
Calcium: 8.2 mg/dL — ABNORMAL LOW (ref 8.9–10.3)
Chloride: 105 mmol/L (ref 98–111)
Creatinine, Ser: 0.65 mg/dL (ref 0.44–1.00)
GFR, Estimated: >60 mL/min (ref >60)
Glucose, Bld: 200 mg/dL — ABNORMAL HIGH (ref 70–99)
Potassium: 4.5 mmol/L (ref 3.5–5.1)
Sodium: 135 mmol/L (ref 135–145)

## 2024-02-25 LAB — MAGNESIUM: Magnesium: 1.4 mg/dL — ABNORMAL LOW (ref 1.7–2.4)

## 2024-02-25 LAB — GLUCOSE, CAPILLARY
Glucose-Capillary: 180 mg/dL — ABNORMAL HIGH (ref 70–99)
Glucose-Capillary: 295 mg/dL — ABNORMAL HIGH (ref 70–99)
Glucose-Capillary: 222 mg/dL — ABNORMAL HIGH (ref 70–99)
Glucose-Capillary: 190 mg/dL — ABNORMAL HIGH (ref 70–99)
Glucose-Capillary: 156 mg/dL — ABNORMAL HIGH (ref 70–99)

## 2024-02-25 MED ORDER — INSULIN GLARGINE 100 UNIT/ML ~~LOC~~ SOLN
5.0000 [IU] | Freq: Every day | SUBCUTANEOUS | Status: DC
  Administered 2024-02-25 – 2024-02-27 (×3): 5 [IU] via SUBCUTANEOUS
  Filled 2024-02-25 (×4): qty 0.05

## 2024-02-25 MED ORDER — MAGNESIUM SULFATE 4 GM/100ML IV SOLN
4.0000 g | Freq: Once | INTRAVENOUS | Status: AC
  Administered 2024-02-25: 4 g via INTRAVENOUS
  Filled 2024-02-25: qty 100

## 2024-02-25 MED ORDER — INSULIN GLARGINE 100 UNITS/ML SOLOSTAR PEN
5.0000 [IU] | PEN_INJECTOR | SUBCUTANEOUS | Status: DC
  Filled 2024-02-25: qty 3

## 2024-02-25 MED ORDER — MAGNESIUM SULFATE 2 GM/50ML IV SOLN: 2.0000 g | Freq: Once | INTRAVENOUS | Status: DC

## 2024-02-25 NOTE — Progress Notes (Signed)
 Daily Progress Note Intern Pager: [phone]  Patient name: Judith [name] Medical record number: [id number] Date of birth: 09/23/1941 Age: 83 y.o. Gender: female  Primary Care Provider: Darral Dash, DO Consultants: None Code Status: DNR-Limited  Pt Overview and Major Events to Date:  2/28-admitted  Assessment and Plan: [name] is a 83 year old female presented with sepsis and AMS. Pertinent PMH/PSH includes T2DM, CAD s/p CABG, HTN, complete heart block with pacemaker. Pt remains altered, likely at baseline mentation. Continues to complain of discomfort and very likely will benefit from returning to natural environment for increased comfort.  Assessment & Plan Sepsis (HCC) Has remained afebrile.  Blood culture shows no growth at over 72 hours.  S/p 4 days of antibiotic coverage with vancomycin, cefepime. - Continue p.o. Augmentin for 7 total doses. Total of 7 days of abx coverage (2/28-3/6) -Follow-up blood cultures Confusion Patient continues to have waxing and waning orientation.  Reports ongoing pain as below. -Improving, continue to avoid sedating medications or possible. - Continue home oxycodone as below  Controlled type 2 diabetes mellitus with diabetic neuropathy, with long-term current use of insulin (HCC) Elevated CBGs overnight, nighttime insulin coverage was added.  CBG this a.m. 180.  Patient is typically on 14 units of Lantus at home. -Add 5 units Lantus today given somewhat elevated sugars - CBG monitoring, sliding scale Leg pain Patient frequently tearful and crying out for help.  Has been receiving Tylenol as scheduled and oxycodone PRN. - Tylenol scheduled every 6 hours - Home oxycodone 2.5 mg twice daily as needed for breakthrough pain Hypokalemia Resolved this morning with K 4.5. - Continue to trend AM BMP Hypomagnesemia 1.4 this AM - replete with 2g IV mag - AM mag recheck   Chronic and Stable Problems:  HTN: no medications currently HLD:  hold Crestor 10 mg daily DDD: Chronic issue. Manage any pain with Tylenol and Voltaren gel at this time.  Peripheral neuropathy: On Gabapentin 400 mg at bedtime, restart when able Depression: Hold Cymbalta 30 mg daily  Complete Heart Block with pacemaker: No hx of echo. Will order. PPM interrogation.  Pulmonary nodules: Incidental 3 mm left upper pulmonary nodule found 05/2023 will monitor with low dose CT in 6-12 months, will put in follow up instructions in discharge summary  Gastroparesis: Hx of partial gastrectomy  Depression: hold sertraline 25mg  daily, mirtazapine 7.5mg  daily  FEN/GI: Dysphagia 1, thin fluids PPx: Lovenox Dispo:SNF pending clinical improvement . Barriers include transition to p.o. antibiotics.   Subjective:  Patient seen this morning lying in bed.  Alert but not oriented.  She asks me to call her daughter [name]. Says "I am hurting" and asks for more pillows.  Objective: Temp:  [97.7 F (36.5 C)-97.8 F (36.6 C)] 97.7 F (36.5 C) (03/04 0719) Pulse Rate:  [89-99] 90 (03/04 0719) Resp:  [18-20] 18 (03/04 0719) BP: (151-157)/(69-93) 151/69 (03/04 0719) SpO2:  [100 %] 100 % (03/04 0719) Physical Exam: General: Frail appearing elderly woman, uncomfortable but not in any acute distress. Cardiovascular: RRR Respiratory: CTA bilaterally, normal work of breathing on room air Extremities: Skin warm and dry  Laboratory: Most recent CBC Lab Results  Component Value Date   WBC 9.5 02/24/2024   HGB 9.4 (L) 02/24/2024   HCT 28.5 (L) 02/24/2024   MCV 97.6 02/24/2024   PLT 703 (H) 02/24/2024   Most recent BMP    Latest Ref Rng & Units 02/25/2024    6:59 AM  BMP  Glucose 70 - 99  mg/dL 161   BUN 8 - 23 mg/dL 19   Creatinine 0.96 - 1.00 mg/dL 0.45   Sodium 409 - 811 mmol/L 135   Potassium 3.5 - 5.1 mmol/L 4.5   Chloride 98 - 111 mmol/L 105   CO2 22 - 32 mmol/L 19   Calcium 8.9 - 10.3 mg/dL 8.2    Magnesium 1.4  Cyndia Skeeters, DO 02/25/2024, 1:51 PM  PGY-1,  [facility] Family Medicine FPTS Intern pager: [phone], text pages welcome Secure chat group [facility] Teaching Service

## 2024-02-25 NOTE — Assessment & Plan Note (Addendum)
 Patient continues to have waxing and waning orientation.  Reports ongoing pain as below. -Improving, continue to avoid sedating medications or possible. - Continue home oxycodone as below

## 2024-02-25 NOTE — Assessment & Plan Note (Addendum)
 Resolved this morning with K 4.5. - Continue to trend AM BMP

## 2024-02-25 NOTE — TOC Progression Note (Signed)
 Transition of Care [facility]) - Progression Note    Patient Details  Name: Judith [name] MRN: [id number] Date of Birth: March 23, 1941  [facility] of [facility]) CM/SW Contact  Delilah Shan, LCSWA Phone Number: 02/25/2024, 10:15 AM  Clinical Narrative:     CSW spoke with Star with [facility] who confirmed SNF bed for patient. CSW started insurance authorization for patient. Auth ID# [id number]. Insurance authorization currently pending. CSW informed MD.  Expected Discharge Plan: Skilled Nursing Facility Barriers to Discharge: Continued Medical Work up  Expected Discharge Plan and Services In-house Referral: Clinical Social Work     Living arrangements for the past 2 months:  (From [employer] term)                                       Social Determinants of Health (SDOH) Interventions SDOH Screenings   Food Insecurity: Patient Unable To Answer (02/23/2024)  Housing: Patient Unable To Answer (02/23/2024)  Transportation Needs: Patient Unable To Answer (02/23/2024)  Utilities: Not At Risk (02/23/2024)  Alcohol Screen: Low Risk  (09/27/2023)  Depression (PHQ2-9): Low Risk  (12/02/2023)  Financial Resource Strain: Low Risk  (10/19/2023)  Physical Activity: Unknown (10/19/2023)  Recent Concern: Physical Activity - Inactive (09/27/2023)  Social Connections: Unknown (02/24/2024)  Recent Concern: Social Connections - Socially Isolated (02/07/2024)  Stress: Patient Declined (10/19/2023)  Tobacco Use: Low Risk  (02/21/2024)  Health Literacy: Adequate Health Literacy (09/27/2023)    Readmission Risk Interventions    02/11/2024   12:09 PM  Readmission Risk Prevention Plan  Transportation Screening Complete  Social Work Consult for Recovery Care Planning/Counseling Complete  Palliative Care Screening Complete  Medication Review Ocean[employer]) Complete

## 2024-02-25 NOTE — Inpatient Diabetes Management (Signed)
 Inpatient Diabetes Program Recommendations  AACE/ADA: New Consensus Statement on Inpatient Glycemic Control (2015)  Target Ranges:  Prepandial:   less than 140 mg/dL      Peak postprandial:   less than 180 mg/dL (1-2 hours)      Critically ill patients:  140 - 180 mg/dL   Lab Results  Component Value Date   GLUCAP 180 (H) 02/25/2024   HGBA1C 6.2 (H) 02/01/2024    Review of Glycemic Control  Latest Reference Range & Units 02/24/24 07:53 02/24/24 11:41 02/24/24 16:30 02/24/24 21:42 02/25/24 07:15  Glucose-Capillary 70 - 99 mg/dL 098 (H) 119 (H) 147 (H) 328 (H) 180 (H)   Diabetes history: DM 2 Outpatient Diabetes medications: Lantus 14 units Daily, Humalog 0-12 units 4x/day Current orders for Inpatient glycemic control:  Novolog 0-9 units tid + hs  Note: Glucose trends increase after PO intake  Inpatient Diabetes Program Recommendations:    -  Start Novolog 2 units tid meal coverage if eating >50% of meals  Thanks,  Christena Deem RN, MSN, BC-ADM Inpatient Diabetes Coordinator Team Pager [phone] (8a-5p)

## 2024-02-25 NOTE — Assessment & Plan Note (Addendum)
 Elevated CBGs overnight, nighttime insulin coverage was added.  CBG this a.m. 180.  Patient is typically on 14 units of Lantus at home. -Add 5 units Lantus today given somewhat elevated sugars - CBG monitoring, sliding scale

## 2024-02-25 NOTE — Plan of Care (Signed)

## 2024-02-25 NOTE — Assessment & Plan Note (Signed)
 1.4 this AM - replete with 2g IV mag - AM mag recheck

## 2024-02-25 NOTE — Assessment & Plan Note (Addendum)
 Has remained afebrile.  Blood culture shows no growth at over 72 hours.  S/p 4 days of antibiotic coverage with vancomycin, cefepime. - Continue p.o. Augmentin for 7 total doses. Total of 7 days of abx coverage (2/28-3/6) -Follow-up blood cultures

## 2024-02-25 NOTE — Assessment & Plan Note (Addendum)
 Patient frequently tearful and crying out for help.  Has been receiving Tylenol as scheduled and oxycodone PRN. - Tylenol scheduled every 6 hours - Home oxycodone 2.5 mg twice daily as needed for breakthrough pain

## 2024-02-25 NOTE — Evaluation (Signed)
 Occupational Therapy Evaluation Patient Details Name: Judith [name] MRN: [id number] DOB: 1941-03-05 Today's Date: 02/25/2024   History of Present Illness   Pt is an 83 y.o. female admitted 2/28 with AMS and sepsis from [facility] where she has been for rehab since Feb 2025 admission. PMH:  DM2, diabetic neuropathy, OA, GERD, HTN, HLD, gout, osteoporosis, sleep apnea, cognitive deficits     Clinical Impressions Pt from SNF (prior was at ALF with gradual decline) was performing transfers with mod A prior admission. Today Pt is bed bound. BLE tucked up under her and resistant to PROM/AAROM stretching - yelling out in pain. Pt confused, alert and pleasant, but confused - very painful with any touch to BLE. Pt max A for grooming today (oral care required max cues and after a short attempt hand over hand assist - max cues for sequencing. Pt overall max to total A for all aspects of ADL. +2 or mechanical lift OOB at this time - however with legs as they are I do not not think that sitting in a chair is possible at this time. OT will continue to follow on trial basis (1x/week) and recommend return to rehab of <3 hours daily. Also recommend re-consulting Palliative care.     If plan is discharge home, recommend the following:   A lot of help with bathing/dressing/bathroom;A lot of help with walking and/or transfers;Assistance with cooking/housework;Direct supervision/assist for medications management;Direct supervision/assist for financial management;Assist for transportation;Supervision due to cognitive status     Functional Status Assessment   Patient has had a recent decline in their functional status and demonstrates the ability to make significant improvements in function in a reasonable and predictable amount of time.     Equipment Recommendations   Other (comment) (defer)     Recommendations for Other Services   PT consult     Precautions/Restrictions    Precautions Precautions: Fall Recall of Precautions/Restrictions: Impaired Restrictions Weight Bearing Restrictions Per Provider Order: No     Mobility Bed Mobility Overal bed mobility: Needs Assistance Bed Mobility: Sidelying to Sit, Sit to Sidelying   Sidelying to sit: Total assist     Sit to sidelying: Total assist General bed mobility comments: Pt maintaining flexed position during transition to/from EOB. Unable to sustain sitting position due to pain - crying out in pain throughout attempts    Transfers                   General transfer comment: deferred, not appropriate at this time      Balance Overall balance assessment: History of Falls, Needs assistance Sitting-balance support: Feet unsupported, No upper extremity supported Sitting balance-Leahy Scale: Zero                                     ADL either performed or assessed with clinical judgement   ADL Overall ADL's : Needs assistance/impaired Eating/Feeding: Maximal assistance Eating/Feeding Details (indicate cue type and reason): can bring hand to mouth, decreased activity tolerance Grooming: Oral care;Maximal assistance;Bed level (HOB elevated) Grooming Details (indicate cue type and reason): Pt provided with set up toothbrush, Pt able to clean on side of her teeth, then stating "I can't do any more" required [facility] assist Upper Body Bathing: Maximal assistance;Bed level   Lower Body Bathing: Total assistance;Bed level   Upper Body Dressing : Maximal assistance   Lower Body Dressing: Total assistance;Bed level Lower Body  Dressing Details (indicate cue type and reason): BLE very contracted Toilet Transfer: Total assistance (bed level)   Toileting- Clothing Manipulation and Hygiene: Total assistance       Functional mobility during ADLs: Total assistance General ADL Comments: Pt overall max to total A for all aspects of self-care. Pt very resistent to any ROM of BLE     Vision    Vision Assessment?: No apparent visual deficits     Perception Perception: Not tested       Praxis Praxis: Not tested       Pertinent Vitals/Pain Pain Assessment Pain Assessment: Faces Pain Score: 10-Worst pain ever Faces Pain Scale: Hurts worst Pain Location: BLE with ROM/mobility (all over) Pain Descriptors / Indicators: Discomfort, Crying, Tender, Grimacing, Guarding Pain Intervention(s): Monitored during session, Limited activity within patient's tolerance, Repositioned     Extremity/Trunk Assessment Upper Extremity Assessment Upper Extremity Assessment: Generalized weakness (able to perform functional ROM for self-feeding and grooming tasks)   Lower Extremity Assessment RLE Deficits / Details: Pt postioned in fetal/flexed position. Resisting ROM into flexion, moaning in pain RLE: Unable to fully assess due to pain LLE Deficits / Details: Pt postioned in fetal/flexed position. Resisting ROM into flexion, moaning in pain LLE: Unable to fully assess due to pain   Cervical / Trunk Assessment Cervical / Trunk Assessment: Kyphotic   Communication Communication Communication: Impaired Factors Affecting Communication: Difficulty expressing self;Reduced clarity of speech   Cognition Arousal: Alert Behavior During Therapy: Flat affect Cognition: History of cognitive impairments             OT - Cognition Comments: hx of cognitive deficit, needs incr time and motivation to mobilize/participate, today she did not know where she was condused                 Following commands: Impaired Following commands impaired: Follows one step commands inconsistently     Cueing  General Comments   Cueing Techniques: Verbal cues;Gestural cues;Tactile cues      Exercises     Shoulder Instructions      Home Living Family/patient expects to be discharged to:: Skilled nursing facility                             Home Equipment: Rollator (4 wheels);Rolling  Walker (2 wheels)   Additional Comments: assist from SNF staff      Prior Functioning/Environment Prior Level of Function : Needs assist       Physical Assist : ADLs (physical);Mobility (physical) Mobility (physical): Transfers;Gait ADLs (physical): Bathing;Dressing;IADLs Mobility Comments: amb with rollator at baseline (at ALF) and during recent hospitalization (02/10/24). Unsure of recent mobility status at SNF. ADLs Comments: assist from SNF staff    OT Problem List: Decreased strength;Decreased activity tolerance;Impaired balance (sitting and/or standing);Decreased cognition;Decreased knowledge of use of DME or AE;Decreased knowledge of precautions   OT Treatment/Interventions: Self-care/ADL training;Therapeutic exercise;Energy conservation;DME and/or AE instruction;Therapeutic activities;Patient/family education;Balance training      OT Goals(Current goals can be found in the care plan section)   Acute Rehab OT Goals Patient Stated Goal: none stated OT Goal Formulation: Patient unable to participate in goal setting Time For Goal Achievement: 03/09/24 Potential to Achieve Goals: Poor   OT Frequency:  Min 1X/week    Co-evaluation              AM-PAC OT "6 Clicks" Daily Activity     Outcome Measure Help from another person eating meals?: None Help  from another person taking care of personal grooming?: None Help from another person toileting, which includes using toliet, bedpan, or urinal?: None Help from another person bathing (including washing, rinsing, drying)?: None Help from another person to put on and taking off regular upper body clothing?: None Help from another person to put on and taking off regular lower body clothing?: None 6 Click Score: 24   End of Session Nurse Communication: Mobility status  Activity Tolerance: Patient limited by pain Patient left: in bed;with call bell/phone within reach;with bed alarm set  OT Visit Diagnosis: Unsteadiness on  feet (R26.81);Other abnormalities of gait and mobility (R26.89);History of falling (Z91.81);Muscle weakness (generalized) (M62.81);Other symptoms and signs involving cognitive function;Adult, failure to thrive (R62.7)                Time: 6578-4696 OT Time Calculation (min): 19 min Charges:  OT General Charges $OT Visit: 1 Visit OT Evaluation $OT Eval Moderate Complexity: 1 Mod  Nyoka Cowden OTR/L Acute Rehabilitation Services Office: [phone]  Emelda Fear 02/25/2024, 1:26 PM

## 2024-02-26 DIAGNOSIS — A419 Sepsis, unspecified organism: Secondary | ICD-10-CM | POA: Diagnosis not present

## 2024-02-26 LAB — BASIC METABOLIC PANEL
Anion gap: 12 (ref 5–15)
Anion gap: 7 (ref 5–15)
BUN: 21 mg/dL (ref 8–23)
BUN: 15 mg/dL (ref 8–23)
CO2: 23 mmol/L (ref 22–32)
CO2: 23 mmol/L (ref 22–32)
Calcium: 8.2 mg/dL — ABNORMAL LOW (ref 8.9–10.3)
Calcium: 8.2 mg/dL — ABNORMAL LOW (ref 8.9–10.3)
Chloride: 99 mmol/L (ref 98–111)
Chloride: 100 mmol/L (ref 98–111)
Creatinine, Ser: 0.68 mg/dL (ref 0.44–1.00)
Creatinine, Ser: 0.73 mg/dL (ref 0.44–1.00)
GFR, Estimated: >60 mL/min (ref >60)
GFR, Estimated: >60 mL/min (ref >60)
Glucose, Bld: 160 mg/dL — ABNORMAL HIGH (ref 70–99)
Glucose, Bld: 203 mg/dL — ABNORMAL HIGH (ref 70–99)
Potassium: 5.2 mmol/L — ABNORMAL HIGH (ref 3.5–5.1)
Potassium: 4.3 mmol/L (ref 3.5–5.1)
Sodium: 134 mmol/L — ABNORMAL LOW (ref 135–145)
Sodium: 130 mmol/L — ABNORMAL LOW (ref 135–145)

## 2024-02-26 LAB — CULTURE, BLOOD (ROUTINE X 2)
Culture: NO GROWTH
Culture: NO GROWTH

## 2024-02-26 LAB — MAGNESIUM: Magnesium: 2.2 mg/dL (ref 1.7–2.4)

## 2024-02-26 LAB — GLUCOSE, CAPILLARY
Glucose-Capillary: 145 mg/dL — ABNORMAL HIGH (ref 70–99)
Glucose-Capillary: 269 mg/dL — ABNORMAL HIGH (ref 70–99)
Glucose-Capillary: 205 mg/dL — ABNORMAL HIGH (ref 70–99)
Glucose-Capillary: 167 mg/dL — ABNORMAL HIGH (ref 70–99)

## 2024-02-26 MED ORDER — ENSURE ENLIVE PO LIQD
237.0000 mL | Freq: Two times a day (BID) | ORAL | Status: DC
Start: 2024-02-27 — End: 2024-03-03
  Administered 2024-02-28 – 2024-03-03 (×6): 237 mL via ORAL

## 2024-02-26 NOTE — Progress Notes (Signed)
 Physical Therapy Treatment Patient Details Name: Judith [name] MRN: [id number] DOB: 12-13-1941 Today's Date: 02/26/2024   History of Present Illness Pt is an 83 y.o. female admitted 2/28 with AMS and sepsis from [facility], where she has been for rehab since Feb 2025 admission. PMH:  DM2, diabetic neuropathy, OA, GERD, HTN, HLD, gout, osteoporosis, sleep apnea, cognitive deficits    PT Comments  Pt with significant contractures of Bil knees, with legs tucked under self, painful with any attempt at repositioning/stretching and significantly limiting mobility. Pt unable to tolerate attempts to come to sit EOB due to pain at [place] with touch. Pt needing total A to roll R/L to reposition as pt with c/o R shoulder pain. Once supine, pt tolerating HOB elevated to 35* and able to complete UE ROM exercises with encouragement for active ROM. Current plan remains appropriate to address deficits and maximize functional independence and decrease caregiver burden. Pt continues to benefit from skilled PT services to progress toward functional mobility goals.     If plan is discharge home, recommend the following: Two people to help with walking and/or transfers;Two people to help with bathing/dressing/bathroom   Can travel by private vehicle     No  Equipment Recommendations  Wheelchair (measurements PT);Wheelchair cushion (measurements PT);Hospital bed    Recommendations for Other Services       Precautions / Restrictions Precautions Precautions: Fall Recall of Precautions/Restrictions: Impaired Restrictions Weight Bearing Restrictions Per Provider Order: No     Mobility  Bed Mobility Overal bed mobility: Needs Assistance Bed Mobility: Sidelying to Sit, Rolling Rolling: Total assist Sidelying to sit: Total assist       General bed mobility comments: Pt maintaining flexed position during rolling and eleavtion of HOB, pt crying out when this PTA touching LEs, unable to progress to EOB due  to pain    Transfers                   General transfer comment: deferred, not appropriate at this time    Ambulation/Gait                   Stairs             Wheelchair Mobility     Tilt Bed    Modified Rankin (Stroke Patients Only)       Balance Overall balance assessment: History of Falls, Needs assistance                                          Communication Communication Communication: Impaired Factors Affecting Communication: Difficulty expressing self;Reduced clarity of speech  Cognition Arousal: Alert Behavior During Therapy: Flat affect   PT - Cognitive impairments: No family/caregiver present to determine baseline                       PT - Cognition Comments: awake and alert throughout session Following commands: Impaired Following commands impaired: Follows one step commands inconsistently    Cueing Cueing Techniques: Verbal cues, Gestural cues, Tactile cues  Exercises General Exercises - Upper Extremity Shoulder Flexion: AAROM, Right, Left, 10 reps, Supine Shoulder ABduction: AAROM, Right, 10 reps, Supine    General Comments General comments (skin integrity, edema, etc.): VSS on RA      Pertinent Vitals/Pain Pain Assessment Pain Assessment: Faces Faces Pain Scale: Hurts worst Pain Location: BLE with ROM/mobility (  all over) Pain Descriptors / Indicators: Discomfort, Crying, Tender, Grimacing, Guarding Pain Intervention(s): Limited activity within patient's tolerance, Repositioned    Home Living                          Prior Function            PT Goals (current goals can now be found in the care plan section) Acute Rehab PT Goals Patient Stated Goal: unable to state PT Goal Formulation: Patient unable to participate in goal setting Time For Goal Achievement: 03/08/24 Progress towards PT goals: Not progressing toward goals - comment    Frequency    Min 1X/week       PT Plan      Co-evaluation              AM-PAC PT "6 Clicks" Mobility   Outcome Measure  Help needed turning from your back to your side while in a flat bed without using bedrails?: Total Help needed moving from lying on your back to sitting on the side of a flat bed without using bedrails?: Total Help needed moving to and from a bed to a chair (including a wheelchair)?: Total Help needed standing up from a chair using your arms (e.g., wheelchair or bedside chair)?: Total Help needed to walk in hospital room?: Total Help needed climbing 3-5 steps with a railing? : Total 6 Click Score: 6    End of Session   Activity Tolerance: Patient limited by pain;Patient limited by fatigue Patient left: in bed;with call bell/phone within reach;with bed alarm set Nurse Communication: Mobility status PT Visit Diagnosis: Other abnormalities of gait and mobility (R26.89);Muscle weakness (generalized) (M62.81)     Time: 9528-4132 PT Time Calculation (min) (ACUTE ONLY): 13 min  Charges:    $Therapeutic Activity: 8-22 mins PT General Charges $$ ACUTE PT VISIT: 1 Visit                     Judith [name] R. PTA Acute Rehabilitation Services Office: [phone]   Judith [name] 02/26/2024, 2:14 PM

## 2024-02-26 NOTE — TOC Progression Note (Addendum)
 Transition of Care [facility]) - Progression Note    Patient Details  Name: Judith [name] MRN: [id number] Date of Birth: 12-Feb-1941  Transition of Care [facility]) CM/SW Contact  Delilah Shan, LCSWA Phone Number: 02/26/2024, 10:01 AM  Clinical Narrative:     Patients insurance authorization currently pending for SNF. CSW informed MD. CSW will continue to follow.  Update- Patients insurance authorization went to a peer to peer for SNF. Tele # [phone] option 5. Deadline is by 3/6 at 10:00am. CSW informed MD.   Expected Discharge Plan: Skilled Nursing Facility Barriers to Discharge: Continued Medical Work up  Expected Discharge Plan and Services In-house Referral: Clinical Social Work     Living arrangements for the past 2 months:  (From [employer] term)                                       Social Determinants of Health (SDOH) Interventions SDOH Screenings   Food Insecurity: Patient Unable To Answer (02/23/2024)  Housing: Patient Unable To Answer (02/23/2024)  Transportation Needs: Patient Unable To Answer (02/23/2024)  Utilities: Not At Risk (02/23/2024)  Alcohol Screen: Low Risk  (09/27/2023)  Depression (PHQ2-9): Low Risk  (12/02/2023)  Financial Resource Strain: Low Risk  (10/19/2023)  Physical Activity: Unknown (10/19/2023)  Recent Concern: Physical Activity - Inactive (09/27/2023)  Social Connections: Unknown (02/24/2024)  Recent Concern: Social Connections - Socially Isolated (02/07/2024)  Stress: Patient Declined (10/19/2023)  Tobacco Use: Low Risk  (02/21/2024)  Health Literacy: Adequate Health Literacy (09/27/2023)    Readmission Risk Interventions    02/11/2024   12:09 PM  Readmission Risk Prevention Plan  Transportation Screening Complete  Social Work Consult for Recovery Care Planning/Counseling Complete  Palliative Care Screening Complete  Medication Review Oceanographer) Complete

## 2024-02-26 NOTE — Assessment & Plan Note (Addendum)
 Suspect acute phase reactant.  Platelets continue to normalize. -Monitor CBC

## 2024-02-26 NOTE — Assessment & Plan Note (Addendum)
 Has remained afebrile.  Blood culture shows no growth at over 72 hours.  S/p 4 days of antibiotic coverage with vancomycin, cefepime. - Continue p.o. Augmentin. Total of 7 days of abx coverage (2/28-3/6)

## 2024-02-26 NOTE — Progress Notes (Signed)
 Initial Nutrition Assessment  DOCUMENTATION CODES:   Severe malnutrition in context of chronic illness  INTERVENTION:  Magic cup TID with meals, each supplement provides 290 kcal and 9 grams of protein  Ensure Enlive po BID, each supplement provides 350 kcal and 20 grams of protein.  Advance diet if within goals of care   NUTRITION DIAGNOSIS:   Severe Malnutrition related to chronic illness as evidenced by severe muscle depletion, severe fat depletion.  GOAL:   Patient will meet greater than or equal to 90% of their needs  MONITOR:   PO intake, Supplement acceptance, Weight trends, Labs  REASON FOR ASSESSMENT:   Other (Comment) (low BMI)    ASSESSMENT:   Pt presented from [facility] since last admission on 2/25. Pt admitted for sepsis with AMS and thrombocytosis. Pt with PMH HTN, DM2, gastroparesis, UTI, GERD, HLD, DDD, peripheral neuropathy, depression, CAD s/p CABG and pacemaker, partial gastrectomy, gout.   Pt's documented meal completions range from 40-100% on dysphagia 1 diet. Per SLP note, pt has no s/s of aspiration but pt requested not wanting diet to be advanced.  Pt confused during visit. Pt reports she does not want to eat anything and says "I don't remember" when asked what kind of foods she likes. Pt declines wanting any nutrition supplements. Pt reports not wanting any other diet and just wants to eat the soft foods she is getting.   Pt reports difficulty chewing due to her lack of teeth.   Admit weight: 47.6 kg Weights appear to be stable per chart review.  Meds: pepcid, novolog, lantus  Labs: sodium low (130), CBGs range 145-295 in last 24 hours  NUTRITION - FOCUSED PHYSICAL EXAM:  Flowsheet Row Most Recent Value  Orbital Region Severe depletion  Upper Arm Region Severe depletion  Thoracic and Lumbar Region Severe depletion  Buccal Region Severe depletion  Temple Region Severe depletion  Clavicle Bone Region Severe depletion  Clavicle and  Acromion Bone Region Severe depletion  Scapular Bone Region Severe depletion  Dorsal Hand Unable to assess  Patellar Region Severe depletion  Anterior Thigh Region Unable to assess  [pt holding knees to chest & wouldn't uunbend for me to assess thighs]  Posterior Calf Region Severe depletion  Edema (RD Assessment) None  Hair Reviewed  Eyes Reviewed  Mouth Reviewed  [few teeth]  Skin Reviewed  Nails Reviewed       Diet Order:   Diet Order             DIET - DYS 1 Room service appropriate? No; Fluid consistency: Thin  Diet effective now                   EDUCATION NEEDS:   Not appropriate for education at this time  Skin:  Skin Assessment: Skin Integrity Issues: Skin Integrity Issues:: Stage II Stage II: left heel  Last BM:  3/3  Height:   Ht Readings from Last 1 Encounters:  02/21/24 5\' 3"  (1.6 m)    Weight:   Wt Readings from Last 1 Encounters:  02/21/24 47.6 kg    Ideal Body Weight:  52.3 kg  BMI:  Body mass index is 18.6 kg/m.  Estimated Nutritional Needs:   Kcal:  1500-1700  Protein:  70-85 g  Fluid:  > 1.5 L    Maceo Pro, MS Dietetic Intern

## 2024-02-26 NOTE — Plan of Care (Addendum)
 PEER TO PEER REVIEW  SNF placement: Short Term Skilled Nursing Facility for rehabilitation  Patient's Diagnosis: Sepsis, Complete heart block with pacemaker, T2DM, DDD, Peripheral Neuropathy  Peer Reviewer's Name: Dr. Colin Rhein. Per the request of peer reviewer Dr. Colin Rhein, this document will be blocked from patient view for privacy concerns related to peer reviewer.  Peer Review Meeting Date: 02/26/2024  Peer to Peer Review Discussion Summary/Clinical Reasoning:  -Medicare requires patient able to get out of for 1 hour to participate. -Incomplete hospital stay, due to pain with PT. Recommend primary address pain.  Can consider reinitiating insurance authorization if appropriate.   Peer to Peer Review Outcome:Denied  Recommended alternative if discussed: Not discussed.  Follow-up action plan: Address pain, reassess mobility progress with PT. if appropriate, reinitiate insurance authorization.

## 2024-02-26 NOTE — Assessment & Plan Note (Addendum)
 Patient continues to have waxing and waning orientation.  Today denies any pain. -Improving, continue to avoid sedating medications or possible. - Continue home oxycodone as below

## 2024-02-26 NOTE — Assessment & Plan Note (Addendum)
 Resolved this morning with K 4.3. - Continue to trend AM BMP

## 2024-02-26 NOTE — Assessment & Plan Note (Addendum)
 Patient comfortable appearing this AM.  Has been receiving Tylenol as scheduled and oxycodone PRN. - Tylenol scheduled every 6 hours - Home oxycodone 2.5 mg twice daily as needed for breakthrough pain

## 2024-02-26 NOTE — Assessment & Plan Note (Addendum)
 1.4 this AM - replete with 2g IV mag - AM mag recheck

## 2024-02-26 NOTE — Assessment & Plan Note (Addendum)
 CBG this a.m. 180.  Patient is typically on 14 units of Lantus at home. -Continue 5 units Lantus today given somewhat elevated sugars; considered increasing but concern for inconsistent PO intake while hospitalized - CBG monitoring, sliding scale

## 2024-02-26 NOTE — Plan of Care (Signed)
  Problem: Nutrition: Goal: Adequate nutrition will be maintained Outcome: Progressing   Problem: Education: Goal: Knowledge of General Education information will improve Description: Including pain rating scale, medication(s)/side effects and non-pharmacologic comfort measures Outcome: Not Progressing   Problem: Activity: Goal: Risk for activity intolerance will decrease Outcome: Not Progressing   Problem: Coping: Goal: Level of anxiety will decrease Outcome: Not Progressing

## 2024-02-26 NOTE — Progress Notes (Signed)
 Daily Progress Note Intern Pager: [phone]  Patient name: Judith [name] Medical record number: [id number] Date of birth: Jul 02, 1941 Age: 83 y.o. Gender: female  Primary Care Provider: Darral Dash, DO Consultants: None Code Status: DNR  Pt Overview and Major Events to Date:  2/28- admitted  Assessment and Plan: Judith [name] is a 83 year old female presented with sepsis and AMS. Pertinent PMH/PSH includes T2DM, CAD s/p CABG, HTN, complete heart block with pacemaker. Pt remains altered, likely at baseline mentation. Bed is available, awaiting prior auth. Assessment & Plan Sepsis (HCC) Has remained afebrile.  Blood culture shows no growth at over 72 hours.  S/p 4 days of antibiotic coverage with vancomycin, cefepime. - Continue p.o. Augmentin. Total of 7 days of abx coverage (2/28-3/6) Confusion Patient continues to have waxing and waning orientation.  Today denies any pain. -Improving, continue to avoid sedating medications or possible. - Continue home oxycodone as below  Controlled type 2 diabetes mellitus with diabetic neuropathy, with long-term current use of insulin (HCC) CBG this a.m. 180.  Patient is typically on 14 units of Lantus at home. -Continue 5 units Lantus today given somewhat elevated sugars; considered increasing but concern for inconsistent PO intake while hospitalized - CBG monitoring, sliding scale Leg pain Patient comfortable appearing this AM.  Has been receiving Tylenol as scheduled and oxycodone PRN. - Tylenol scheduled every 6 hours - Home oxycodone 2.5 mg twice daily as needed for breakthrough pain Hypokalemia Resolved this morning with K 4.3. - Continue to trend AM BMP Hypomagnesemia 1.4 this AM - replete with 2g IV mag - AM mag recheck Thrombocytosis  Suspect acute phase reactant.  Platelets continue to normalize. -Monitor CBC   Chronic and Stable Problems:  HTN: no medications currently HLD: hold Crestor 10 mg daily DDD: Chronic  issue. Manage any pain with Tylenol and Voltaren gel at this time.  Peripheral neuropathy: On Gabapentin 400 mg at bedtime, restart when able Depression: Hold Cymbalta 30 mg daily  Complete Heart Block with pacemaker: No hx of echo. Will order. PPM interrogation.  Pulmonary nodules: Incidental 3 mm left upper pulmonary nodule found 05/2023 will monitor with low dose CT in 6-12 months, will put in follow up instructions in discharge summary  Gastroparesis: Hx of partial gastrectomy  Depression: hold sertraline 25mg  daily, mirtazapine 7.5mg  daily   FEN/GI: Dysphagia 1, thin fluids PPx: Lovenox Dispo: SNF pending prior auth .  Subjective:  Resting peacefully in bed this AM. Awakens to verbal stimulation and denies pain. Asks for help with her breakfast.  Objective: Temp:  [97.3 F (36.3 C)-98.3 F (36.8 C)] 97.8 F (36.6 C) (03/05 0843) Pulse Rate:  [87-99] 99 (03/05 0843) Resp:  [16-20] 16 (03/05 0843) BP: (142-149)/(65-82) 142/82 (03/05 0843) SpO2:  [98 %-100 %] 98 % (03/05 0843) Physical Exam: General: calm, soft-spoken, NAD Cardiovascular: RRR Respiratory: CTAB Extremities: No LE edema, moves all equally  Laboratory: Most recent CBC Lab Results  Component Value Date   WBC 9.5 02/24/2024   HGB 9.4 (L) 02/24/2024   HCT 28.5 (L) 02/24/2024   MCV 97.6 02/24/2024   PLT 703 (H) 02/24/2024   Most recent BMP    Latest Ref Rng & Units 02/26/2024    9:12 AM  BMP  Glucose 70 - 99 mg/dL 147   BUN 8 - 23 mg/dL 15   Creatinine 8.29 - 1.00 mg/dL 5.62   Sodium 130 - 865 mmol/L 130   Potassium 3.5 - 5.1 mmol/L 4.3   Chloride 98 -  111 mmol/L 100   CO2 22 - 32 mmol/L 23   Calcium 8.9 - 10.3 mg/dL 8.2     Cyndia Skeeters, DO 02/26/2024, 11:20 AM  PGY-1, [facility] Family [facility] FPTS Intern pager: [phone], text pages welcome Secure chat group [facility] Teaching Service

## 2024-02-27 DIAGNOSIS — A419 Sepsis, unspecified organism: Secondary | ICD-10-CM | POA: Diagnosis not present

## 2024-02-27 DIAGNOSIS — D75839 Thrombocytosis, unspecified: Secondary | ICD-10-CM | POA: Diagnosis not present

## 2024-02-27 DIAGNOSIS — R5383 Other fatigue: Secondary | ICD-10-CM | POA: Diagnosis not present

## 2024-02-27 DIAGNOSIS — R410 Disorientation, unspecified: Secondary | ICD-10-CM | POA: Diagnosis not present

## 2024-02-27 LAB — BASIC METABOLIC PANEL
Anion gap: 11 (ref 5–15)
BUN: 24 mg/dL — ABNORMAL HIGH (ref 8–23)
CO2: 20 mmol/L — ABNORMAL LOW (ref 22–32)
Calcium: 8.1 mg/dL — ABNORMAL LOW (ref 8.9–10.3)
Chloride: 101 mmol/L (ref 98–111)
Creatinine, Ser: 0.68 mg/dL (ref 0.44–1.00)
GFR, Estimated: >60 mL/min (ref >60)
Glucose, Bld: 160 mg/dL — ABNORMAL HIGH (ref 70–99)
Potassium: 4.8 mmol/L (ref 3.5–5.1)
Sodium: 132 mmol/L — ABNORMAL LOW (ref 135–145)

## 2024-02-27 LAB — GLUCOSE, CAPILLARY
Glucose-Capillary: 158 mg/dL — ABNORMAL HIGH (ref 70–99)
Glucose-Capillary: 237 mg/dL — ABNORMAL HIGH (ref 70–99)
Glucose-Capillary: 271 mg/dL — ABNORMAL HIGH (ref 70–99)
Glucose-Capillary: 226 mg/dL — ABNORMAL HIGH (ref 70–99)

## 2024-02-27 LAB — MAGNESIUM: Magnesium: 1.9 mg/dL (ref 1.7–2.4)

## 2024-02-27 MED ORDER — INSULIN GLARGINE 100 UNIT/ML ~~LOC~~ SOLN
10.0000 [IU] | Freq: Every day | SUBCUTANEOUS | Status: DC
Start: 2024-02-28 — End: 2024-03-03
  Administered 2024-02-28 – 2024-03-03 (×5): 10 [IU] via SUBCUTANEOUS
  Filled 2024-02-27 (×5): qty 0.1

## 2024-02-27 MED ORDER — HYDROMORPHONE HCL 1 MG/ML IJ SOLN
0.5000 mg | Freq: Once | INTRAMUSCULAR | Status: AC | PRN
  Administered 2024-02-28: 0.5 mg via INTRAVENOUS
  Filled 2024-02-27: qty 0.5

## 2024-02-27 MED ORDER — INSULIN GLARGINE 100 UNIT/ML ~~LOC~~ SOLN
5.0000 [IU] | Freq: Once | SUBCUTANEOUS | Status: AC
  Administered 2024-02-27: 5 [IU] via SUBCUTANEOUS
  Filled 2024-02-27: qty 0.05

## 2024-02-27 NOTE — Progress Notes (Signed)
 Daily Progress Note Intern Pager: [phone]  Patient name: Judith [name] Medical record number: [id number] Date of birth: November 10, 1941 Age: 83 y.o. Gender: female  Primary Care Provider: Darral Dash, DO Consultants: None Code Status: DNR  Pt Overview and Major Events to Date:  2/28-admitted  Assessment and Plan: Judith [name] is an 83 year old female presented with sepsis and AMS. Patient is clinically stable today, on last day of antibiotics.  Still awaiting appropriate placement for discharge. Assessment & Plan Sepsis (HCC) Has remained afebrile.  Blood culture shows no growth at over 72 hours.  S/p 6 days of antibiotic coverage. Today will be final day of abx. - Continue p.o. Augmentin. Total of 7 days of abx coverage (2/28-3/6) Confusion Patient continues to have waxing and waning orientation.  Na 132 this AM - question pt PO intake; documented 100% of meals in last 24hr, will monitor fluid intake today and consider small IVF bolus if appears dehydrated. - Improving, continue to avoid sedating medications or possible. - Continue home oxycodone as below  Controlled type 2 diabetes mellitus with diabetic neuropathy, with long-term current use of insulin (HCC) CBG this a.m. 180.  Patient is typically on 14 units of Lantus at home. -Continue 5 units Lantus today given somewhat elevated sugars; considered increasing but concern for inconsistent PO intake while hospitalized - CBG monitoring, sliding scale Leg pain Patient reporting more pain this morning and asks for additional pain medicine. - Tylenol scheduled every 6 hours - Home oxycodone 2.5 mg twice daily as needed for breakthrough pain - Consider giving x1 dose of 0.5mg  dilaudid prior to PT in hopes that pt will be able to participate Hypokalemia Resolved. - Continue to trend AM BMP Hypomagnesemia Resolved. 1.9 this AM. - AM mag recheck Thrombocytosis  Suspect acute phase reactant.  Platelets continue to  normalize. -Monitor CBC    Chronic and Stable Problems:  HTN: no medications currently HLD: home Crestor 10 mg daily DDD: Chronic issue. Has Tylenol, oxycodone ordered as above. May also use Voltaren gel. Peripheral neuropathy: Home Gabapentin 400 mg at bedtime. Depression: Hold Cymbalta 30 mg daily  Gastroparesis: Hx of partial gastrectomy  Depression: hold sertraline 25mg  daily, mirtazapine 7.5mg  daily   FEN/GI: Dysphagia 1, with thin fluids PPx: Lovenox Dispo:SNF pending clinical improvement . Barriers include increased participation in PT, maintaining adequate pain control.   Subjective:  Pt lying in bed this morning reporting "bad pain." Otherwise denies any concerns. Tells me she ate a good breakfast.  Objective: Temp:  [97.4 F (36.3 C)-98.2 F (36.8 C)] 97.4 F (36.3 C) (03/06 0852) Pulse Rate:  [100-102] 102 (03/06 0852) Resp:  [16-17] 17 (03/06 0852) BP: (138-154)/(60-128) 154/80 (03/06 0852) SpO2:  [99 %-100 %] 100 % (03/06 0852) Physical Exam: General: Lying in bed, NAD Cardiovascular: RRR Respiratory: CTA bilaterally Abdomen: soft, nontender Extremities: pt legs folded up in bed underneath her with pillow between knees.  Laboratory: Most recent CBC Lab Results  Component Value Date   WBC 9.5 02/24/2024   HGB 9.4 (L) 02/24/2024   HCT 28.5 (L) 02/24/2024   MCV 97.6 02/24/2024   PLT 703 (H) 02/24/2024   Most recent BMP    Latest Ref Rng & Units 02/27/2024    7:17 AM  BMP  Glucose 70 - 99 mg/dL 147   BUN 8 - 23 mg/dL 24   Creatinine 8.29 - 1.00 mg/dL 5.62   Sodium 130 - 865 mmol/L 132   Potassium 3.5 - 5.1 mmol/L 4.8  Chloride 98 - 111 mmol/L 101   CO2 22 - 32 mmol/L 20   Calcium 8.9 - 10.3 mg/dL 8.1    Magnesium 1.9  Cyndia Skeeters, DO 02/27/2024, 1:50 PM  PGY-1, [facility] Family Medicine FPTS Intern pager: [phone], text pages welcome Secure chat group [facility] Teaching Service

## 2024-02-27 NOTE — Assessment & Plan Note (Addendum)
 CBG this a.m. 180.  Patient is typically on 14 units of Lantus at home. -Continue 5 units Lantus today given somewhat elevated sugars; considered increasing but concern for inconsistent PO intake while hospitalized - CBG monitoring, sliding scale

## 2024-02-27 NOTE — Care Management Important Message (Signed)
 Important Message  Patient Details  Name: Judith [name] MRN: [id number] Date of Birth: 08-16-1941   Important Message Given:  Yes - Medicare IM     Sherilyn Banker 02/27/2024, 2:38 PM

## 2024-02-27 NOTE — Assessment & Plan Note (Signed)
 Resolved. 1.9 this AM. - AM mag recheck

## 2024-02-27 NOTE — TOC Progression Note (Addendum)
 Transition of Care [facility]) - Progression Note    Patient Details  Name: Judith [name] MRN: [id number] Date of Birth: Dec 11, 1941  [facility] of [facility]) CM/SW Contact  Carley Hammed, LCSW Phone Number: 02/27/2024, 10:35 AM  Clinical Narrative:    CSW notified authorization will need to be restarted after PT sees pt again. Medical team following to assist with additional needs for approval. TOC will continue to follow for DC needs.   3:00 CSW notified by MD that family would like to do LTC at [place]. Per family they had started the LT Medicaid process but were unsure of how far it had gotten. CSW spoke with facility who stated that process was started but the change in program was not completed and pt would still need an [place] to return to [place]. CSW notified medical team. TOC will continue to follow.   Expected Discharge Plan: Skilled Nursing Facility Barriers to Discharge: Continued Medical Work up  Expected Discharge Plan and Services In-house Referral: Clinical Social Work     Living arrangements for the past 2 months:  (From [employer] term)                                       Social Determinants of Health (SDOH) Interventions SDOH Screenings   Food Insecurity: Patient Unable To Answer (02/23/2024)  Housing: Patient Unable To Answer (02/23/2024)  Transportation Needs: Patient Unable To Answer (02/23/2024)  Utilities: Not At Risk (02/23/2024)  Alcohol Screen: Low Risk  (09/27/2023)  Depression (PHQ2-9): Low Risk  (12/02/2023)  Financial Resource Strain: Low Risk  (10/19/2023)  Physical Activity: Unknown (10/19/2023)  Recent Concern: Physical Activity - Inactive (09/27/2023)  Social Connections: Unknown (02/24/2024)  Recent Concern: Social Connections - Socially Isolated (02/07/2024)  Stress: Patient Declined (10/19/2023)  Tobacco Use: Low Risk  (02/21/2024)  Health Literacy: Adequate Health Literacy (09/27/2023)    Readmission Risk Interventions    02/11/2024    12:09 PM  Readmission Risk Prevention Plan  Transportation Screening Complete  Social Work Consult for Recovery Care Planning/Counseling Complete  Palliative Care Screening Complete  Medication Review [employer]) Complete

## 2024-02-27 NOTE — Assessment & Plan Note (Addendum)
 Patient continues to have waxing and waning orientation.  Na 132 this AM - question pt PO intake; documented 100% of meals in last 24hr, will monitor fluid intake today and consider small IVF bolus if appears dehydrated. - Improving, continue to avoid sedating medications or possible. - Continue home oxycodone as below

## 2024-02-27 NOTE — Assessment & Plan Note (Signed)
 Resolved. - Continue to trend AM BMP

## 2024-02-27 NOTE — Assessment & Plan Note (Addendum)
 Suspect acute phase reactant.  Platelets continue to normalize. -Monitor CBC

## 2024-02-27 NOTE — Assessment & Plan Note (Signed)
 Has remained afebrile.  Blood culture shows no growth at over 72 hours.  S/p 6 days of antibiotic coverage. Today will be final day of abx. - Continue p.o. Augmentin. Total of 7 days of abx coverage (2/28-3/6)

## 2024-02-27 NOTE — Care Plan (Signed)
 Spoke to patient's daughter [name].  Provided care updates and answered all questions to the best of my ability.  [name] reports that there has been more decline in the hospital than she was anticipating, and she was surprised to see the last time she visited that her mom would not unfold her legs or try to sit up on her own or get out of bed; pt has had difficulty participating in PT lately.  [name] reports that plan is for her mom to ultimately go to long-term SNF.  She also shares that her mom was addicted to oxycodone for many years has only managed to overcome this in the last several years. It is her preference and her mom not take any more oxycodone than her current home regimen (2.5 mg twice daily).  Plan as discussed with patient's daughter [name] is to convey this new SNF plan to our social work team so that they can proceed with trying to find appropriate placement.  Additionally will plan for one-time 0.5 mg Dilaudid offered prior to PT/OT session to try to encourage patient to participate by alleviating some discomfort.  Will continue to avoid any additional oxycodone.  Cyndia Skeeters, DO [facility], PGY-1 02/27/24 2:07 PM  Service pager [phone]

## 2024-02-27 NOTE — Assessment & Plan Note (Addendum)
 Patient reporting more pain this morning and asks for additional pain medicine. - Tylenol scheduled every 6 hours - Home oxycodone 2.5 mg twice daily as needed for breakthrough pain - Consider giving x1 dose of 0.5mg  dilaudid prior to PT in hopes that pt will be able to participate

## 2024-02-28 DIAGNOSIS — R531 Weakness: Secondary | ICD-10-CM

## 2024-02-28 DIAGNOSIS — F0390 Unspecified dementia without behavioral disturbance: Secondary | ICD-10-CM | POA: Diagnosis not present

## 2024-02-28 DIAGNOSIS — R5383 Other fatigue: Secondary | ICD-10-CM | POA: Diagnosis not present

## 2024-02-28 LAB — GLUCOSE, CAPILLARY
Glucose-Capillary: 92 mg/dL (ref 70–99)
Glucose-Capillary: 270 mg/dL — ABNORMAL HIGH (ref 70–99)
Glucose-Capillary: 253 mg/dL — ABNORMAL HIGH (ref 70–99)
Glucose-Capillary: 180 mg/dL — ABNORMAL HIGH (ref 70–99)

## 2024-02-28 MED ORDER — MIRTAZAPINE 15 MG PO TABS
7.5000 mg | ORAL_TABLET | Freq: Every day | ORAL | Status: DC
  Administered 2024-02-28 – 2024-03-02 (×4): 7.5 mg via ORAL
  Filled 2024-02-28 (×4): qty 1

## 2024-02-28 MED ORDER — SERTRALINE HCL 50 MG PO TABS
25.0000 mg | ORAL_TABLET | Freq: Every day | ORAL | Status: DC
  Administered 2024-02-28: 25 mg via ORAL
  Filled 2024-02-28: qty 1

## 2024-02-28 NOTE — Assessment & Plan Note (Deleted)
 CBG this a.m. 180.  Patient is typically on 14 units of Lantus at home. -Continue 5 units Lantus today given somewhat elevated sugars; considered increasing but concern for inconsistent PO intake while hospitalized - CBG monitoring, sliding scale

## 2024-02-28 NOTE — Assessment & Plan Note (Deleted)
 Suspect acute phase reactant.  Platelets continue to normalize. -Monitor CBC

## 2024-02-28 NOTE — Progress Notes (Signed)
 Mobility Specialist Progress Note:   02/28/24 1400  Mobility  Activity Transferred from chair to bed;Turned to left side  Level of Assistance Dependent, patient does less than 25% (+2)  Assistive Device MaxiMove  Activity Response Tolerated well  Mobility visit 1 Mobility  Mobility Specialist Start Time (ACUTE ONLY) 1405  Mobility Specialist Stop Time (ACUTE ONLY) 1420  Mobility Specialist Time Calculation (min) (ACUTE ONLY) 15 min   Pt received in chair, agreeable to transfer back to bed per RN request. Pt very anxious about mobility but easy to redirect with encouragement. Pt was able to tolerate transfer with [facility] this session with minimal complaints. Pt left in bed with RN still present in room.   Leory Plowman  Mobility Specialist Please contact via [employer] office at [phone]

## 2024-02-28 NOTE — Plan of Care (Signed)

## 2024-02-28 NOTE — Assessment & Plan Note (Signed)
 Completed 7 days total of antibiotics coverage (2/28 - 3/6), no longer on oral Augmentin.  VSS, afebrile.

## 2024-02-28 NOTE — Assessment & Plan Note (Addendum)
 Patient continues to have waxing and waning orientation. - Improving, continue to avoid sedating medications or possible. - Continue home oxycodone as below

## 2024-02-28 NOTE — Progress Notes (Signed)
 Physical Therapy Treatment Patient Details Name: Judith [name] MRN: [id number] DOB: 1941/07/04 Today's Date: 02/28/2024   History of Present Illness Pt is an 83 y.o. female admitted 2/28 with AMS and sepsis from [facility], where she has been for rehab since Feb 2025 admission. PMH:  DM2, diabetic neuropathy, OA, GERD, HTN, HLD, gout, osteoporosis, sleep apnea, cognitive deficits    PT Comments  Pt received in R sidelying in fetal position with pillows under R HB and between her knees, pt awake and confused, RN clearance for session as pt not fully oriented. Pt premedicated and expresses fear of mobility due to pain, pt with significant guarding of BLE, especially her knees, but tolerates some gentle AA and PROM while repositioning to try to improve her knee ROM. Pt total A +2 for rolling to place hoyer pad and hoyer OOB to chair, once in chair pt positioned with pillows under her BLE/calves with limbs resting on front lower portion of chair to provide slow static stretch to improve ROM. Pt not yet able to achieve 90* knee extension but much improved once up in chair, air cushion under her hips for pressure relief. RN notified of pt position and recommend hoyer lift back to bed after 1-2 hours, MD team also notified pt would benefit from air mattress for pressure relief given pt unable to relieve pressure on her own/due to cognition and may need more extensive therapies for serial static splinting post-acute to reduce contracture/bil knee guarding. Patient will benefit from continued inpatient follow up therapy, <3 hours/day     If plan is discharge home, recommend the following: Two people to help with walking and/or transfers;Two people to help with bathing/dressing/bathroom;Supervision due to cognitive status;Assistance with cooking/housework;Assistance with feeding;Direct supervision/assist for medications management;Direct supervision/assist for financial management;Assist for  transportation;Help with stairs or ramp for entrance   Can travel by private vehicle     No  Equipment Recommendations  Wheelchair (measurements PT);Wheelchair cushion (measurements PT);Hospital bed;Hoyer lift;Other (comment) (air mattress for her bed)    Recommendations for Other Services       Precautions / Restrictions Precautions Precautions: Fall Recall of Precautions/Restrictions: Impaired Precaution/Restrictions Comments: significant fear of mobility Restrictions Weight Bearing Restrictions Per Provider Order: No     Mobility  Bed Mobility Overal bed mobility: Needs Assistance Bed Mobility: Sidelying to Sit, Rolling Rolling: Total assist, +2 for physical assistance         General bed mobility comments: Pt maintaining flexed position during rolling, pt premedicated prior to session and seems to tolerate staff assist slightly better today, pt allowing hoyer lift pad to be placed behind her, in order to lift OOB to chair.    Transfers                   General transfer comment: pt able to pull from reclined chair posture to long sitting with minA/CGA for safety when holding on to hoyer lift metal rails, but not able to maintain long sitting for more than 1-2 mins prior to c/o discomfort and wanting to recline more. Pt tolerates chair back ~60 deg today with geomat cushion under her hips and pillows behind her calves to assist with static stretching while pt sitting in chair with ~100* knee flexion with feet toward floor. Pt knees too contracted and pt in too much pain to tolerate sit<>stand trials at this time, she will likely need daily serial static splinting to improve bil knee ROM to allow for standing transfer attempts.  Ambulation/Gait                   Stairs             Wheelchair Mobility     Tilt Bed    Modified Rankin (Stroke Patients Only)       Balance Overall balance assessment: History of Falls, Needs  assistance Sitting-balance support: Feet unsupported, Bilateral upper extremity supported Sitting balance-Leahy Scale: Poor Sitting balance - Comments: sitting upright with both hands on [place] rail, kyphotic trunk and cervical posture       Standing balance comment: pt not able; BLE knee contractures/guarding                            Communication Communication Communication: Impaired Factors Affecting Communication: Difficulty expressing self;Reduced clarity of speech  Cognition Arousal: Alert Behavior During Therapy: Flat affect, Anxious   PT - Cognitive impairments: No family/caregiver present to determine baseline                       PT - Cognition Comments: Awake and alert throughout session, pt appears to have significant fear of mobility, when PTA asks questions about PLOF or tries to make connection with pt about hobbies or interests, she states "I don't know". Pt in fetal position when PTA arrived to room and keeps knees very contracted throughout but able to tolerate repositioning as detailed below with gentle vcs and tactile facilitiation Following commands: Impaired Following commands impaired: Follows one step commands with increased time (~50%)    Cueing Cueing Techniques: Verbal cues, Gestural cues, Tactile cues  Exercises General Exercises - Upper Extremity Elbow Extension: AAROM, Both, 5 reps, Supine Digit Composite Flexion: AROM, Both, 10 reps, Seated Composite Extension: AAROM, Both, 10 reps, Seated Other Exercises Other Exercises: gentle AAROM/passive stretching at bil knees to allow for enough knee extension to place hoyer lift pad straps under and between her legs, and then once in chair, pillows and blanket placed behind limbs to encourage slow static stretch while in chair position Other Exercises: Cues to pull to long sitting holding on to hoyer rail x3 reps for BUE strengthening    General Comments        Pertinent Vitals/Pain  Pain Assessment Pain Assessment: PAINAD Breathing: occasional labored breathing, short period of hyperventilation Negative Vocalization: occasional moan/groan, low speech, negative/disapproving quality Facial Expression: facial grimacing Body Language: rigid, fists clenched, knees up, pushing/pulling away, strikes out Consolability: distracted or reassured by voice/touch PAINAD Score: 7 Pain Location: "all over", but mostly seems to be bil knees Pain Descriptors / Indicators: Discomfort, Crying, Tender, Grimacing, Guarding Pain Intervention(s): Limited activity within patient's tolerance, Monitored during session     PT Goals (current goals can now be found in the care plan section) Acute Rehab PT Goals Patient Stated Goal: unable to state PT Goal Formulation: Patient unable to participate in goal setting Time For Goal Achievement: 03/08/24 Progress towards PT goals: Progressing toward goals    Frequency    Min 1X/week      PT Plan         AM-PAC PT "6 Clicks" Mobility   Outcome Measure  Help needed turning from your back to your side while in a flat bed without using bedrails?: Total Help needed moving from lying on your back to sitting on the side of a flat bed without using bedrails?: Total Help needed moving to and from a bed  to a chair (including a wheelchair)?: Total Help needed standing up from a chair using your arms (e.g., wheelchair or bedside chair)?: Total Help needed to walk in hospital room?: Total Help needed climbing 3-5 steps with a railing? : Total 6 Click Score: 6    End of Session Equipment Utilized During Treatment: Other (comment) (hoyer lift pad/bed pads) Activity Tolerance: Patient limited by pain;Other (comment) (pt cognitive deficit limiting her participation with some aspects but pt cooperative with encouragement at times) Patient left: with call bell/phone within reach;in chair;with chair alarm set (lap belt alarm in place, pt shown how to  remove velcro) Nurse Communication: Mobility status;Need for lift equipment;Other (comment) (safe technique for return transfer via hoyer lift) PT Visit Diagnosis: Other abnormalities of gait and mobility (R26.89);Muscle weakness (generalized) (M62.81)     Time: 4098-1191 PT Time Calculation (min) (ACUTE ONLY): 51 min  Charges:    $Therapeutic Exercise: 8-22 mins $Therapeutic Activity: 23-37 mins PT General Charges $$ ACUTE PT VISIT: 1 Visit                     Kamya Watling P., PTA Acute Rehabilitation Services Secure Chat Preferred 9a-5:30pm Office: [phone]    Dorathy Kinsman [facility] 02/28/2024, 2:38 PM

## 2024-02-28 NOTE — TOC Progression Note (Addendum)
 Transition of Care [facility]) - Progression Note    Patient Details  Name: Judith [name] MRN: [id number] Date of Birth: [birth year]  Transition of Care [facility]) CM/SW Contact  Carley Hammed, LCSW Phone Number: 02/28/2024, 3:19 PM  Clinical Narrative:    CSW spoke with PT and reviewed pt's note. CSW restarted authorization and included documentation from last months admission showing pt being ambulatory. CSW notified facility to auth pending again. At this time, without auth, another payor source will need to be established for pt to return to [place]. CSW left VM to update dtr. TOC will continue to follow.    Expected Discharge Plan: Skilled Nursing Facility Barriers to Discharge: Continued Medical Work up  Expected Discharge Plan and Services In-house Referral: Clinical Social Work     Living arrangements for the past 2 months:  (From [employer] term)                                       Social Determinants of Health (SDOH) Interventions SDOH Screenings   Food Insecurity: Patient Unable To Answer (02/23/2024)  Housing: Patient Unable To Answer (02/23/2024)  Transportation Needs: Patient Unable To Answer (02/23/2024)  Utilities: Not At Risk (02/23/2024)  Alcohol Screen: Low Risk  (09/27/2023)  Depression (PHQ2-9): Low Risk  (12/02/2023)  Financial Resource Strain: Low Risk  (10/19/2023)  Physical Activity: Unknown (10/19/2023)  Recent Concern: Physical Activity - Inactive (09/27/2023)  Social Connections: Unknown (02/24/2024)  Recent Concern: Social Connections - Socially Isolated (02/07/2024)  Stress: Patient Declined (10/19/2023)  Tobacco Use: Low Risk  (02/21/2024)  Health Literacy: Adequate Health Literacy (09/27/2023)    Readmission Risk Interventions    02/11/2024   12:09 PM  Readmission Risk Prevention Plan  Transportation Screening Complete  Social Work Consult for Recovery Care Planning/Counseling Complete  Palliative Care Screening Complete  Medication Review  Oceanographer) Complete

## 2024-02-28 NOTE — Progress Notes (Signed)
     Daily Progress Note Intern Pager: [phone]  Patient name: Judith [name] Medical record number: [id number] Date of birth: 10-04-1941 Age: 83 y.o. Gender: female  Primary Care Provider: Darral Dash, DO Consultants: None Code Status: DNR  Pt Overview and Major Events to Date:  2/28-admitted  Assessment and Plan: Judith [name] is a 83 year old female who presented with sepsis and AMS.  She is clinically stable today and has completed her full course of antibiotics.  Still waiting for appropriate placement for discharge.  Assessment & Plan Sepsis (HCC) Completed 7 days total of antibiotics coverage (2/28 - 3/6), no longer on oral Augmentin.  VSS, afebrile. Confusion Patient continues to have waxing and waning orientation. - Improving, continue to avoid sedating medications or possible. - Continue home oxycodone as below  Leg pain Patient denies pain this morning. - Tylenol scheduled every 6 hours - Home oxycodone 2.5 mg twice daily as needed for breakthrough pain - Consider giving x1 dose of 0.5mg  dilaudid prior to PT in hopes that pt will be able to participate    Chronic and Stable Problems:  HTN: no medications currently HLD: home Crestor 10 mg daily DDD: Chronic issue. Has Tylenol, oxycodone ordered as above. May also use Voltaren gel. Peripheral neuropathy: Home Gabapentin 400 mg at bedtime. Depression: Hold Cymbalta 30 mg daily  Gastroparesis: Hx of partial gastrectomy  Depression: restart home mirtazapine 7.5mg  at bedtime; holding home sertraline as pt is on duloxetine T2DM: Continue 10 units Lantus, SSI, CBGs  FEN/GI: Phase 1, thin fluids PPx: Lovenox Dispo:SNF  pending approval . Barriers include participation in PT.   Subjective:  Patient seen this morning sleeping in bed comfortably, but she awakens to gentle verbal stimuli.  She denies pain or any concerns this morning.  Objective: Temp:  [97.4 F (36.3 C)-98.4 F (36.9 C)] 98.4 F (36.9 C) (03/07  0532) Pulse Rate:  [81-102] 81 (03/07 0532) Resp:  [17-20] 18 (03/07 0532) BP: (141-154)/(63-80) 141/63 (03/07 0532) SpO2:  [93 %-100 %] 93 % (03/07 0532) Physical Exam: General: Patient sleeping comfortably in bed, awakens to verbal stimuli, NAD. Cardiovascular: RRR Respiratory: CTA anteriorly.  No increased work of breathing. Abdomen: Soft, nontender Extremities: No LE edema  Laboratory: Most recent CBC Lab Results  Component Value Date   WBC 9.5 02/24/2024   HGB 9.4 (L) 02/24/2024   HCT 28.5 (L) 02/24/2024   MCV 97.6 02/24/2024   PLT 703 (H) 02/24/2024   Most recent BMP    Latest Ref Rng & Units 02/27/2024    7:17 AM  BMP  Glucose 70 - 99 mg/dL 130   BUN 8 - 23 mg/dL 24   Creatinine 8.65 - 1.00 mg/dL 7.84   Sodium 696 - 295 mmol/L 132   Potassium 3.5 - 5.1 mmol/L 4.8   Chloride 98 - 111 mmol/L 101   CO2 22 - 32 mmol/L 20   Calcium 8.9 - 10.3 mg/dL 8.1     Cyndia Skeeters, DO 02/28/2024, 7:55 AM  PGY-1,  Family Medicine FPTS Intern pager: [phone], text pages welcome Secure chat group [facility] Teaching Service

## 2024-02-28 NOTE — Assessment & Plan Note (Deleted)
 Resolved. 1.9 this AM. - AM mag recheck

## 2024-02-28 NOTE — Assessment & Plan Note (Deleted)
 Resolved. - Continue to trend AM BMP

## 2024-02-28 NOTE — Assessment & Plan Note (Addendum)
 Patient denies pain this morning. - Tylenol scheduled every 6 hours - Home oxycodone 2.5 mg twice daily as needed for breakthrough pain - Consider giving x1 dose of 0.5mg  dilaudid prior to PT in hopes that pt will be able to participate

## 2024-02-29 DIAGNOSIS — M79605 Pain in left leg: Secondary | ICD-10-CM

## 2024-02-29 DIAGNOSIS — G309 Alzheimer's disease, unspecified: Secondary | ICD-10-CM | POA: Diagnosis not present

## 2024-02-29 DIAGNOSIS — F02B3 Dementia in other diseases classified elsewhere, moderate, with mood disturbance: Secondary | ICD-10-CM

## 2024-02-29 DIAGNOSIS — M79604 Pain in right leg: Secondary | ICD-10-CM | POA: Diagnosis not present

## 2024-02-29 LAB — GLUCOSE, CAPILLARY
Glucose-Capillary: 94 mg/dL (ref 70–99)
Glucose-Capillary: 168 mg/dL — ABNORMAL HIGH (ref 70–99)
Glucose-Capillary: 238 mg/dL — ABNORMAL HIGH (ref 70–99)
Glucose-Capillary: 221 mg/dL — ABNORMAL HIGH (ref 70–99)

## 2024-02-29 LAB — CREATININE, SERUM
Creatinine, Ser: 0.6 mg/dL (ref 0.44–1.00)
GFR, Estimated: >60 mL/min (ref >60)

## 2024-02-29 MED ORDER — HALOPERIDOL LACTATE 5 MG/ML IJ SOLN
0.5000 mg | INTRAMUSCULAR | Status: AC
  Administered 2024-02-29: 0.5 mg via INTRAVENOUS
  Filled 2024-02-29: qty 1

## 2024-02-29 NOTE — Assessment & Plan Note (Addendum)
 Patient complains of leg pain this morning. She was able to work with PT yesterday to get out of the bed to chair. - Tylenol scheduled every 6 hours - Home oxycodone 2.5 mg twice daily as needed for breakthrough pain - Consider giving x1 dose of 0.5mg  dilaudid prior to PT in hopes that pt will be able to participate

## 2024-02-29 NOTE — Care Plan (Signed)
 Called daughter [name] to update on slow progress with patient. Answered questions to the best of my ability.  Proposed consulting Palliative services given patient's low motivation to participate in PT and other activities she typically finds enjoyable, as well as her waxing and waning mental status within her baseline dementia. [name] is open to this and I will place the consult.  Cyndia Skeeters, DO [facility], PGY-1 02/29/24 5:07 PM  Service pager [phone]

## 2024-02-29 NOTE — Progress Notes (Signed)
 Yellow MEWS initiated due to inaccurate vitals, of high pulse rate of 115. Recheck vitals, pulse rate within normal limits of 83, MEWS is green. Notified CN, Cameron Proud, RN. Dr. Cyndia Skeeters was notified at bedside. I will continue to monitor.

## 2024-02-29 NOTE — Assessment & Plan Note (Addendum)
 Patient with baseline dementia, AMS initially worsened with sepsis but now appears to be returned to baseline for many days.  She continues to have waxing and waning orientation but is able to consistently answer yes/no questions. - Continue to avoid sedating medications when possible - Continue low-dose home oxycodone as below

## 2024-02-29 NOTE — Progress Notes (Signed)
     Daily Progress Note Intern Pager: [phone]  Patient name: Judith [name] Medical record number: [id number] Date of birth: [birth year] Age: 83 y.o. Gender: female  Primary Care Provider: Darral Dash, DO Consultants: None Code Status: DNR  Pt Overview and Major Events to Date:  2/28-admitted  Assessment and Plan: Judith [name] is an 83 year old female initially presented with sepsis and AMS, she is now stable and awaiting appropriate placement for discharge.  Assessment & Plan Dementia [facility]) Patient with baseline dementia, AMS initially worsened with sepsis but now appears to be returned to baseline for many days.  She continues to have waxing and waning orientation but is able to consistently answer yes/no questions. - Continue to avoid sedating medications when possible - Continue low-dose home oxycodone as below Leg pain Patient complains of leg pain this morning. She was able to work with PT yesterday to get out of the bed to chair. - Tylenol scheduled every 6 hours - Home oxycodone 2.5 mg twice daily as needed for breakthrough pain - Consider giving x1 dose of 0.5mg  dilaudid prior to PT in hopes that pt will be able to participate   Chronic and Stable Problems:  HTN: no medications currently HLD: home Crestor 10 mg daily DDD: Chronic issue. Has Tylenol, oxycodone ordered as above. May also use Voltaren gel. Peripheral neuropathy: Home Gabapentin 400 mg at bedtime. Depression: Hold Cymbalta 30 mg daily  Gastroparesis: Hx of partial gastrectomy  Depression: restart home mirtazapine 7.5mg  at bedtime; holding home sertraline as pt is on duloxetine T2DM: Continue 10 units Lantus, SSI, CBGs  FEN/GI: Dysphagia 1, thin fluids PPx: Lovenox Dispo:SNF  pending placement . Barriers include progress with PT.   Subjective:  Patient seen this morning lying in bed asleep, though awakens gently to verbal stimuli.  She reports she is hungry and would like to eat breakfast.  Reports  ongoing leg pain.  Objective: Temp:  [97.1 F (36.2 C)-98 F (36.7 C)] 98 F (36.7 C) (03/08 0531) Pulse Rate:  [63-115] 115 (03/08 0745) Resp:  [16-18] 16 (03/08 0745) BP: (118-154)/(54-90) 154/54 (03/08 0745) SpO2:  [97 %-100 %] 97 % (03/08 0531) Physical Exam: General: Lying in bed, NAD Cardiovascular: RRR Respiratory: No increased work of breathing on room air  Laboratory: Most recent CBC Lab Results  Component Value Date   WBC 9.5 02/24/2024   HGB 9.4 (L) 02/24/2024   HCT 28.5 (L) 02/24/2024   MCV 97.6 02/24/2024   PLT 703 (H) 02/24/2024   Most recent BMP    Latest Ref Rng & Units 02/27/2024    7:17 AM  BMP  Glucose 70 - 99 mg/dL 528   BUN 8 - 23 mg/dL 24   Creatinine 4.13 - 1.00 mg/dL 2.44   Sodium 010 - 272 mmol/L 132   Potassium 3.5 - 5.1 mmol/L 4.8   Chloride 98 - 111 mmol/L 101   CO2 22 - 32 mmol/L 20   Calcium 8.9 - 10.3 mg/dL 8.1     Cyndia Skeeters, DO 02/29/2024, 7:49 AM  PGY-1, [facility] FPTS Intern pager: [phone], text pages welcome Secure chat group [facility] Teaching Service

## 2024-02-29 NOTE — Plan of Care (Signed)
  Problem: Nutrition: Goal: Adequate nutrition will be maintained Outcome: Progressing   Problem: Coping: Goal: Level of anxiety will decrease Outcome: Progressing   Problem: Safety: Goal: Ability to remain free from injury will improve Outcome: Progressing   Problem: Skin Integrity: Goal: Risk for impaired skin integrity will decrease Outcome: Progressing   

## 2024-02-29 NOTE — Plan of Care (Signed)
   Problem: Nutrition: Goal: Adequate nutrition will be maintained Outcome: Progressing   Problem: Safety: Goal: Ability to remain free from injury will improve Outcome: Progressing   Problem: Skin Integrity: Goal: Risk for impaired skin integrity will decrease Outcome: Progressing

## 2024-03-01 DIAGNOSIS — F02B3 Dementia in other diseases classified elsewhere, moderate, with mood disturbance: Secondary | ICD-10-CM | POA: Diagnosis not present

## 2024-03-01 DIAGNOSIS — E1140 Type 2 diabetes mellitus with diabetic neuropathy, unspecified: Secondary | ICD-10-CM | POA: Diagnosis not present

## 2024-03-01 DIAGNOSIS — G309 Alzheimer's disease, unspecified: Secondary | ICD-10-CM | POA: Diagnosis not present

## 2024-03-01 DIAGNOSIS — Z794 Long term (current) use of insulin: Secondary | ICD-10-CM

## 2024-03-01 DIAGNOSIS — R531 Weakness: Secondary | ICD-10-CM | POA: Diagnosis not present

## 2024-03-01 LAB — GLUCOSE, CAPILLARY
Glucose-Capillary: 137 mg/dL — ABNORMAL HIGH (ref 70–99)
Glucose-Capillary: 279 mg/dL — ABNORMAL HIGH (ref 70–99)
Glucose-Capillary: 248 mg/dL — ABNORMAL HIGH (ref 70–99)
Glucose-Capillary: 202 mg/dL — ABNORMAL HIGH (ref 70–99)

## 2024-03-01 LAB — CBC
HCT: 34.4 % — ABNORMAL LOW (ref 36.0–46.0)
Hemoglobin: 11.1 g/dL — ABNORMAL LOW (ref 12.0–15.0)
MCH: 32.2 pg (ref 26.0–34.0)
MCHC: 32.3 g/dL (ref 30.0–36.0)
MCV: 99.7 fL (ref 80.0–100.0)
Platelets: 600 10*3/uL — ABNORMAL HIGH (ref 150–400)
RBC: 3.45 MIL/uL — ABNORMAL LOW (ref 3.87–5.11)
RDW: 13.9 % (ref 11.5–15.5)
WBC: 8.7 10*3/uL (ref 4.0–10.5)
nRBC: 0 % (ref 0.0–0.2)

## 2024-03-01 NOTE — Assessment & Plan Note (Addendum)
 Patient with baseline dementia, AMS initially worsened with sepsis but now appears to be returned to baseline for many days.  She continues to have waxing and waning orientation but is able to consistently answer yes/no questions. - Continue to avoid sedating medications when possible

## 2024-03-01 NOTE — Assessment & Plan Note (Signed)
 Completed 7 days total of antibiotics coverage (2/28 - 3/6), no longer on oral Augmentin.  VSS, afebrile.

## 2024-03-01 NOTE — Plan of Care (Signed)
  Problem: Safety: Goal: Ability to remain free from injury will improve Outcome: Progressing   Problem: Nutritional: Goal: Maintenance of adequate nutrition will improve Outcome: Progressing

## 2024-03-01 NOTE — Plan of Care (Signed)
  Problem: Education: Goal: Knowledge of General Education information will improve Description: Including pain rating scale, medication(s)/side effects and non-pharmacologic comfort measures Outcome: Progressing   Problem: Nutrition: Goal: Adequate nutrition will be maintained Outcome: Progressing   Problem: Coping: Goal: Level of anxiety will decrease Outcome: Progressing   Problem: Elimination: Goal: Will not experience complications related to urinary retention Outcome: Progressing   Problem: Pain Managment: Goal: General experience of comfort will improve and/or be controlled Outcome: Progressing   Problem: Safety: Goal: Ability to remain free from injury will improve Outcome: Progressing   Problem: Skin Integrity: Goal: Risk for impaired skin integrity will decrease Outcome: Progressing   Problem: Coping: Goal: Ability to adjust to condition or change in health will improve Outcome: Progressing

## 2024-03-01 NOTE — Assessment & Plan Note (Addendum)
 Suspect acute phase reactant.   -CBC today

## 2024-03-01 NOTE — Assessment & Plan Note (Signed)
-  Home gabapentin - Tylenol scheduled every 6 hours - Home oxycodone 2.5 mg twice daily as needed for breakthrough pain

## 2024-03-01 NOTE — Assessment & Plan Note (Deleted)
 Patient continues to have waxing and waning orientation. - Improving, continue to avoid sedating medications or possible. - Continue home oxycodone as below

## 2024-03-01 NOTE — Progress Notes (Signed)
 Return call to daughter, [name], at [phone] to give an update and answer all questions to the best of my knowledge.

## 2024-03-01 NOTE — TOC Progression Note (Signed)
 Transition of Care [facility]) - Initial/Assessment Note    Patient Details  Name: Judith [name] MRN: [id number] Date of Birth: 11-25-1941  [facility]) CM/SW Contact:    Ralene Bathe, LCSW Phone Number: 03/01/2024, 9:14 AM  Clinical Narrative:                 Insurance authorization for SNF still pending.  TOC will continue to follow.  Expected Discharge Plan: Skilled Nursing Facility Barriers to Discharge: Continued Medical Work up   Patient Goals and CMS Choice Patient states their goals for this hospitalization and ongoing recovery are:: SNF   Choice offered to / list presented to : Adult Children (Patients daughter Judith [name])      Expected Discharge Plan and Services In-house Referral: Clinical Social Work     Living arrangements for the past 2 months:  (From [employer] term)                                      Prior Living Arrangements/Services Living arrangements for the past 2 months:  (From [employer] Short term) Lives with:: Investment banker, operational (Lives at [facility]) Patient language and need for interpreter reviewed:: Yes Do you feel safe going back to the place where you live?: No   SNF  Need for Family Participation in Patient Care: Yes (Comment) Care giver support system in place?: Yes (comment)   Criminal Activity/Legal Involvement Pertinent to Current Situation/Hospitalization: No - Comment as needed  Activities of Daily Living   ADL Screening (condition at time of admission) Independently performs ADLs?: No Does the patient have a NEW difficulty with bathing/dressing/toileting/self-feeding that is expected to last >3 days?: No Does the patient have a NEW difficulty with getting in/out of bed, walking, or climbing stairs that is expected to last >3 days?: No Does the patient have a NEW difficulty with communication that is expected to last >3 days?: No Is the patient deaf or have difficulty hearing?: No Does the patient have  difficulty seeing, even when wearing glasses/contacts?: No Does the patient have difficulty concentrating, remembering, or making decisions?: No  Permission Sought/Granted Permission sought to share information with : Case Manager, Family Supports, Magazine features editor                Emotional Assessment       Orientation: : Oriented to Self, Oriented to Place Alcohol / Substance Use: Not Applicable Psych Involvement: No (comment)  Admission diagnosis:  Thrombocytosis [D75.839] Sepsis (HCC) [A41.9] Fatigue, unspecified type [R53.83] Sepsis, due to unspecified organism, unspecified whether acute organ dysfunction present [facility]) [A41.9] Patient Active Problem List   Diagnosis Date Noted   Dementia (HCC) 02/28/2024   Fatigue 02/27/2024   Hypomagnesemia 02/25/2024   Leg pain 02/24/2024   Sepsis (HCC) 02/21/2024   Thrombocytosis 02/21/2024   Hyperglycemia 02/07/2024   Elevated troponin  Demand Ischemia 02/07/2024   Demand ischemia (HCC) 02/07/2024   Acute cystitis without hematuria 02/03/2024   Generalized weakness 02/02/2024   Acute cystitis with hematuria 01/30/2024   Confusion 01/29/2024   Protein-calorie malnutrition, severe 01/27/2024   Fall 01/25/2024   Esophageal abnormality 01/25/2024   Knee abrasion 01/25/2024   Frequent falls 01/25/2024   Goals of care, counseling/discussion 12/05/2023   Depressed mood 11/12/2023   Suicidal ideation 10/29/2023   Pulmonary nodule 06/17/2023   Pacemaker battery depletion 04/25/2023   Urinary incontinence 08/22/2022   Sleep  apnea    Hyperlipidemia    Polycythemia    Unspecified inflammatory spondylopathy, cervical region (HCC) 11/15/2020   Multilevel degenerative disc disease 04/03/2018   Acute blood loss anemia 01/17/2018   Unintentional weight loss 01/17/2018   S/P partial gastrectomy    Peripheral neuropathy    Benign essential HTN    Controlled type 2 diabetes mellitus with diabetic neuropathy, with  long-term current use of insulin (HCC)    Depression    Presence of cardiac pacemaker    Gout    Cervical spine arthritis 04/05/2016   Long-term insulin use in type 2 diabetes (HCC) 04/05/2016   PCP:  Darral Dash, DO Pharmacy:   [facility] [place], [place] - [address] [place] [address] Phone: [phone] Fax: [phone]  [id number] [address] [facility] [address] [place] [address] Phone: [phone] Fax: [phone]  [facility] [address] [place] [address] Phone: [phone] Fax: [phone]     Social Drivers of Health (SDOH) Social History: SDOH Screenings   Food Insecurity: Patient Unable To Answer (02/23/2024)  Housing: Patient Unable To Answer (02/23/2024)  Transportation Needs: Patient Unable To Answer (02/23/2024)  Utilities: Not At Risk (02/23/2024)  Alcohol Screen: Low Risk  (09/27/2023)  Depression (PHQ2-9): Low Risk  (12/02/2023)  Financial Resource Strain: Low Risk  (10/19/2023)  Physical Activity: Unknown (10/19/2023)  Recent Concern: Physical Activity - Inactive (09/27/2023)  Social Connections: Unknown (02/24/2024)  Recent Concern: Social Connections - Socially Isolated (02/07/2024)  Stress: Patient Declined (10/19/2023)  Tobacco Use: Low Risk  (02/21/2024)  Health Literacy: Adequate Health Literacy (09/27/2023)   SDOH Interventions:     Readmission Risk Interventions    02/11/2024   12:09 PM  Readmission Risk Prevention Plan  Transportation Screening Complete  Social Work Consult for Recovery Care Planning/Counseling Complete  Palliative Care Screening Complete  Medication Review Oceanographer) Complete

## 2024-03-01 NOTE — Progress Notes (Signed)
     Daily Progress Note Intern Pager: [phone]  Patient name: JERLINE LINZY Medical record number: [id number] Date of birth: [birth year] Age: 83 y.o. Gender: female  Primary Care Provider: Darral Dash, DO Consultants: None Code Status: DNR  Pt Overview and Major Events to Date:  2/8-admitted  Assessment and Plan: Ajani Schnieders is a 83 year old female who presented with sepsis (resolved)and AMS.  She is medically stable and awaiting placement Assessment & Plan Dementia [facility]) Patient with baseline dementia, AMS initially worsened with sepsis but now appears to be returned to baseline for many days.  She continues to have waxing and waning orientation but is able to consistently answer yes/no questions. - Continue to avoid sedating medications when possible  Leg pain -Home gabapentin - Tylenol scheduled every 6 hours - Home oxycodone 2.5 mg twice daily as needed for breakthrough pain  Controlled type 2 diabetes mellitus with diabetic neuropathy, with long-term current use of insulin (HCC) CBG this a.m. 221.  Patient is typically on 14 units of Lantus at home but not eating consistently during hospitalization - Continue 10 units Lantus  - CBG monitoring, sliding scale Sepsis (HCC) Completed 7 days total of antibiotics coverage (2/28 - 3/6), no longer on oral Augmentin.  VSS, afebrile. Thrombocytosis  Suspect acute phase reactant.   -CBC today     Significant chronic and Stable Issues: Peripheral neuropathy-continue home gabapentin Depression-continue home Cymbalta,  mirtazapine  FEN/GI: Dysphagia 1 PPx: Lovenox Dispo: SNF pending placement  Subjective:  Sleeping, says "mmm hmm" when asked if she is feeling okay this morning and goes back to sleep   Objective: Temp:  [97.5 F (36.4 C)-98.2 F (36.8 C)] 97.5 F (36.4 C) (03/09 0403) Pulse Rate:  [63-115] 85 (03/09 0403) Resp:  [16-19] 16 (03/09 0403) BP: (101-154)/(54-81) 148/65 (03/09 0403) SpO2:  [97 %-100 %]  99 % (03/09 0403) Physical Exam: General: Lying in bed sleeping, NAD Cardiovascular: RRR Respiratory: Breathing comfortably on room air   Laboratory: Most recent CBC Lab Results  Component Value Date   WBC 9.5 02/24/2024   HGB 9.4 (L) 02/24/2024   HCT 28.5 (L) 02/24/2024   MCV 97.6 02/24/2024   PLT 703 (H) 02/24/2024   Most recent BMP    Latest Ref Rng & Units 02/29/2024    7:04 AM  BMP  Creatinine 0.44 - 1.00 mg/dL 1.47      Erick Alley, DO 03/01/2024, 4:35 AM  PGY-3, [facility] FPTS Intern pager: [phone], text pages welcome Secure chat group [facility] Teaching Service

## 2024-03-01 NOTE — Assessment & Plan Note (Addendum)
 CBG this a.m. 221.  Patient is typically on 14 units of Lantus at home but not eating consistently during hospitalization - Continue 10 units Lantus  - CBG monitoring, sliding scale

## 2024-03-02 DIAGNOSIS — K5900 Constipation, unspecified: Secondary | ICD-10-CM | POA: Insufficient documentation

## 2024-03-02 DIAGNOSIS — Z789 Other specified health status: Secondary | ICD-10-CM

## 2024-03-02 DIAGNOSIS — G309 Alzheimer's disease, unspecified: Secondary | ICD-10-CM | POA: Diagnosis not present

## 2024-03-02 DIAGNOSIS — F02B3 Dementia in other diseases classified elsewhere, moderate, with mood disturbance: Secondary | ICD-10-CM | POA: Diagnosis not present

## 2024-03-02 DIAGNOSIS — R531 Weakness: Secondary | ICD-10-CM | POA: Diagnosis not present

## 2024-03-02 LAB — GLUCOSE, CAPILLARY
Glucose-Capillary: 125 mg/dL — ABNORMAL HIGH (ref 70–99)
Glucose-Capillary: 242 mg/dL — ABNORMAL HIGH (ref 70–99)
Glucose-Capillary: 352 mg/dL — ABNORMAL HIGH (ref 70–99)
Glucose-Capillary: 142 mg/dL — ABNORMAL HIGH (ref 70–99)

## 2024-03-02 MED ORDER — DOCUSATE SODIUM 100 MG PO CAPS
100.0000 mg | ORAL_CAPSULE | Freq: Once | ORAL | Status: AC
  Administered 2024-03-02: 100 mg via ORAL
  Filled 2024-03-02: qty 1

## 2024-03-02 NOTE — Assessment & Plan Note (Addendum)
 Patient with baseline dementia, appears to be at baseline mentation now for many days.  Able to consistently answer yes/no questions.*** - Continue to avoid sedating medications when possible

## 2024-03-02 NOTE — Assessment & Plan Note (Addendum)
 992 on admission.  Downtrending to 600 yesterday.  Suspect acute phase reactant.  No further need to trend at this time.

## 2024-03-02 NOTE — Plan of Care (Signed)

## 2024-03-02 NOTE — Assessment & Plan Note (Addendum)
 Chronic problem.*** -Continue home gabapentin - Tylenol scheduled every 6 hours - Home oxycodone 2.5 mg twice daily as needed for breakthrough pain

## 2024-03-02 NOTE — Assessment & Plan Note (Addendum)
 HTN: no medications currently HLD: home Crestor 10 mg daily DDD: Chronic issue. Has Tylenol, oxycodone ordered as above. May also use Voltaren gel. Peripheral neuropathy: Home Gabapentin 400 mg at bedtime. Gastroparesis: Hx of partial gastrectomy  Depression: Continue home mirtazapine 7.5mg  at bedtime; holding home sertraline as pt is on duloxetine T2DM: Continue 10 units Lantus, SSI, CBGs

## 2024-03-02 NOTE — Progress Notes (Addendum)
     Daily Progress Note Intern Pager: [phone]  Patient name: Judith [name] Medical record number: [id number] Date of birth: 1941-02-12 Age: 83 y.o. Gender: female  Primary Care Provider: Darral Dash, DO Consultants: None Code Status: DNR  Pt Overview and Major Events to Date:  2/28-admitted 3/6-completed antibiotics (2/28-3/6)  Assessment and Plan: Judith [name] is an 83 year old female now recovered from sepsis, AMS returned to baseline mentation.  Stable and awaiting appropriate placement for discharge. Assessment & Plan Dementia [facility]) Patient with baseline dementia, appears to be at baseline mentation now for many days.  Able to consistently answer yes/no questions.  - Continue to avoid sedating medications when possible Leg pain Chronic problem. -Continue home gabapentin - Tylenol scheduled every 6 hours - Home oxycodone 2.5 mg twice daily as needed for breakthrough pain Constipation No bowel movement in 4 days.  Is on very low-dose oxycodone. - Colace today - If no BM, consider enema Thrombocytosis 992 on admission.  Downtrending to 600 yesterday.  Suspect acute phase reactant.  No further need to trend at this time. Chronic health problem HTN: no medications currently HLD: home Crestor 10 mg daily DDD: Chronic issue. Has Tylenol, oxycodone ordered as above. May also use Voltaren gel. Peripheral neuropathy: Home Gabapentin 400 mg at bedtime. Gastroparesis: Hx of partial gastrectomy  Depression: Continue home mirtazapine 7.5mg  at bedtime; holding home sertraline as pt is on duloxetine T2DM: Continue 10 units Lantus, SSI, CBGs   FEN/GI: Dysphagia 1, thin fluids PPx: Lovenox Dispo:SNF  pending available placement . Barriers include progress with PT.   Subjective:  Patient seen this morning lying in bed curled up on her side, awakens to verbal stimuli. Answers "mm-hmm" to all questions, then goes back to sleep.  Objective: Temp:  [97.4 F (36.3 C)-98.5 F  (36.9 C)] 97.9 F (36.6 C) (03/10 0759) Pulse Rate:  [52-94] 81 (03/10 0759) Resp:  [16-20] 16 (03/10 0759) BP: (123-159)/(51-94) 153/65 (03/10 0759) SpO2:  [99 %-100 %] 100 % (03/10 0759) Physical Exam: General: Sleeping, NAD Cardiovascular: RRR Respiratory: CTA anteriorly Abdomen: Soft, nondistended Extremities: Patient curled up, flexing all extremities.  No edema.  Laboratory: Most recent CBC Lab Results  Component Value Date   WBC 8.7 03/01/2024   HGB 11.1 (L) 03/01/2024   HCT 34.4 (L) 03/01/2024   MCV 99.7 03/01/2024   PLT 600 (H) 03/01/2024   Most recent BMP    Latest Ref Rng & Units 02/29/2024    7:04 AM  BMP  Creatinine 0.44 - 1.00 mg/dL 9.62      Judith Skeeters, DO 03/02/2024, 8:06 AM  PGY-1, [facility] Family Medicine FPTS Intern pager: [phone], text pages welcome Secure chat group [facility] Teaching Service

## 2024-03-02 NOTE — Assessment & Plan Note (Signed)
 No bowel movement in 4 days.  Is on very low-dose oxycodone.*** - Colace today - If no BM, consider enema

## 2024-03-02 NOTE — Assessment & Plan Note (Deleted)
 CBG this a.m. 221.  Patient is typically on 14 units of Lantus at home but not eating consistently during hospitalization - Continue 10 units Lantus  - CBG monitoring, sliding scale

## 2024-03-02 NOTE — TOC Progression Note (Addendum)
 Transition of Care [facility]) - Progression Note    Patient Details  Name: Judith [name] MRN: [id number] Date of Birth: [birth year]  Transition of Care [facility]) CM/SW Contact  Carley Hammed, LCSW Phone Number: 03/02/2024, 10:28 AM  Clinical Narrative:    Pt's auth went to peer to peer, deadline of 2:30 Pm on Monday 3/10/ # to call is [phone] opt 5. Md notified they will need pt name, DOB, and Insurance ID # which was provided. TOC will continue to follow.   3:00 Per medical team, pt is not meeting criteria for SNF at this time. CSW followed up again with facility to determine other options. Rep states they will need to speak with administration and business office but they typically do not take Medicaid pending/ change of program pt's from the hospital. Rep called back and stated that facility is considering, but will need to speak with family about patient's check for this month. TOC will continue to follow for DC needs.  Expected Discharge Plan: Skilled Nursing Facility Barriers to Discharge: Continued Medical Work up  Expected Discharge Plan and Services In-house Referral: Clinical Social Work     Living arrangements for the past 2 months:  (From [employer] term)                                       Social Determinants of Health (SDOH) Interventions SDOH Screenings   Food Insecurity: Patient Unable To Answer (02/23/2024)  Housing: Patient Unable To Answer (02/23/2024)  Transportation Needs: Patient Unable To Answer (02/23/2024)  Utilities: Not At Risk (02/23/2024)  Alcohol Screen: Low Risk  (09/27/2023)  Depression (PHQ2-9): Low Risk  (12/02/2023)  Financial Resource Strain: Low Risk  (10/19/2023)  Physical Activity: Unknown (10/19/2023)  Recent Concern: Physical Activity - Inactive (09/27/2023)  Social Connections: Unknown (02/24/2024)  Recent Concern: Social Connections - Socially Isolated (02/07/2024)  Stress: Patient Declined (10/19/2023)  Tobacco Use: Low Risk   (02/21/2024)  Health Literacy: Adequate Health Literacy (09/27/2023)    Readmission Risk Interventions    02/11/2024   12:09 PM  Readmission Risk Prevention Plan  Transportation Screening Complete  Social Work Consult for Recovery Care Planning/Counseling Complete  Palliative Care Screening Complete  Medication Review Oceanographer) Complete

## 2024-03-03 DIAGNOSIS — G309 Alzheimer's disease, unspecified: Secondary | ICD-10-CM | POA: Diagnosis not present

## 2024-03-03 DIAGNOSIS — Z794 Long term (current) use of insulin: Secondary | ICD-10-CM | POA: Diagnosis not present

## 2024-03-03 DIAGNOSIS — E1140 Type 2 diabetes mellitus with diabetic neuropathy, unspecified: Secondary | ICD-10-CM | POA: Diagnosis not present

## 2024-03-03 DIAGNOSIS — F02B3 Dementia in other diseases classified elsewhere, moderate, with mood disturbance: Secondary | ICD-10-CM | POA: Diagnosis not present

## 2024-03-03 LAB — GLUCOSE, CAPILLARY
Glucose-Capillary: 131 mg/dL — ABNORMAL HIGH (ref 70–99)
Glucose-Capillary: 138 mg/dL — ABNORMAL HIGH (ref 70–99)
Glucose-Capillary: 250 mg/dL — ABNORMAL HIGH (ref 70–99)

## 2024-03-03 MED ORDER — ACETAMINOPHEN 325 MG PO TABS: 650.0000 mg | ORAL_TABLET | Freq: Four times a day (QID) | ORAL | Status: DC

## 2024-03-03 MED ORDER — INSULIN GLARGINE 100 UNIT/ML ~~LOC~~ SOLN: 5.0000 [IU] | Freq: Every day | SUBCUTANEOUS | Status: DC

## 2024-03-03 NOTE — Assessment & Plan Note (Deleted)
 Chronic problem.*** -Continue home gabapentin - Tylenol scheduled every 6 hours - Home oxycodone 2.5 mg twice daily as needed for breakthrough pain

## 2024-03-03 NOTE — Plan of Care (Signed)
 Brief Palliative Medicine Progress Note:  PMT consult received and chart reviewed.   Noted Judith [name] has plans for discharge today. Discussed option of outpatient Palliative Care referral with Dr. Mliss Sax. TOC notified of recommendation - stated to ensure order was in and facility would coordinate.   Ambulatory referral for outpatient Palliative Care placed.  Thank you for allowing PMT to assist in the care of this patient.  Kiriana Worthington M. Katrinka Blazing [facility] Palliative Medicine Team Team Phone: [phone] NO CHARGE

## 2024-03-03 NOTE — Assessment & Plan Note (Deleted)
 992 on admission.  Downtrending to 600 yesterday.  Suspect acute phase reactant.  No further need to trend at this time.

## 2024-03-03 NOTE — Assessment & Plan Note (Deleted)
 Patient with baseline dementia, appears to be at baseline mentation now for many days.  Able to consistently answer yes/no questions.*** - Continue to avoid sedating medications when possible

## 2024-03-03 NOTE — TOC Transition Note (Signed)
 Transition of Care [facility]) - Discharge Note   Patient Details  Name: Judith [name] MRN: [id number] Date of Birth: 13-Sep-1941  Transition of Care [facility]) CM/SW Contact:  Carley Hammed, LCSW Phone Number: 03/03/2024, 11:52 AM   Clinical Narrative:    Pt to be transported to [facility] via [facility]. Nurse to call report to [phone].  Facility agreeable to accepting pt back with Medicaid pending change in program, dtr aware.    Final next level of care: Skilled Nursing Facility Barriers to Discharge: Barriers Resolved   Patient Goals and CMS Choice Patient states their goals for this hospitalization and ongoing recovery are:: SNF   Choice offered to / list presented to : Adult Children (Patients daughter [name])      Discharge Placement              Patient chooses bed at: [facility] Patient to be transferred to facility by: [facility] Name of family member notified: [name] Patient and family notified of of transfer: 03/03/24  Discharge Plan and Services Additional resources added to the After Visit Summary for   In-house Referral: Clinical Social Work                                   Social Drivers of Health (SDOH) Interventions SDOH Screenings   Food Insecurity: Patient Unable To Answer (02/23/2024)  Housing: Patient Unable To Answer (02/23/2024)  Transportation Needs: Patient Unable To Answer (02/23/2024)  Utilities: Not At Risk (02/23/2024)  Alcohol Screen: Low Risk  (09/27/2023)  Depression (PHQ2-9): Low Risk  (12/02/2023)  Financial Resource Strain: Low Risk  (10/19/2023)  Physical Activity: Unknown (10/19/2023)  Recent Concern: Physical Activity - Inactive (09/27/2023)  Social Connections: Unknown (02/24/2024)  Recent Concern: Social Connections - Socially Isolated (02/07/2024)  Stress: Patient Declined (10/19/2023)  Tobacco Use: Low Risk  (02/21/2024)  Health Literacy: Adequate Health Literacy (09/27/2023)     Readmission Risk Interventions    02/11/2024   12:09 PM   Readmission Risk Prevention Plan  Transportation Screening Complete  Social Work Consult for Recovery Care Planning/Counseling Complete  Palliative Care Screening Complete  Medication Review Oceanographer) Complete

## 2024-03-03 NOTE — Discharge Summary (Signed)
 Family Medicine Teaching [facility] Discharge Summary  Patient name: Judith [name] Medical record number: [id number] Date of birth: 05/02/1941 Age: 83 y.o. Gender: female Date of Admission: 02/21/2024  Date of Discharge: 03/03/24 Admitting Physician: Shelby Mattocks, DO  Primary Care Provider: Darral Dash, DO Consultants: none  Indication for Hospitalization: sepsis, AMS  Brief Hospital Course:  Judith [name] is a 83 y.o. female with history of type 2 diabetes (on insulin), HLD, DDD, peripheral neuropathy, depression, complete heart block with pacemaker, gastroparesis and depression.  She presented to the ED from her skilled nursing facility with concern of altered mental status.  She met sepsis criteria and was admitted for IV antibiotics, IV fluids and monitoring.  Sepsis  AMS Patient remained hemodynamically stable and afebrile during hospitalization.  No source was ever determined for infection however she was continued on IV vancomycin, cefepime, Flagyl with improvement.  On 3/3 patient was transitioned to oral Augmentin for total of 7 days of antibiotic coverage (2/28 - 3/6).  Thrombocytosis Patient initially with elevated platelets to 992.  This was monitored daily with CBCs and platelets did downtrend.  This was thought to be an acute phase reactant in light of patient's sepsis.   Type 2 diabetes Patient with poor oral intake and home insulin was held.  CBGs were monitored and remained stable for several days. Once patient increased p.o. intake basal insulin was restarted.  At time of discharge patient was on 5 units of basal insulin daily.  Other chronic conditions were medically managed with home medications and formulary alternatives as necessary (HLD, DDD, peripheral neuropathy, depression, complete heart block with pacemaker, depression).  Follow-up recommendations: 1) follow-up basal insulin as this was decreased in the hospital due to poor p.o. 2) Incidental 3  mm left upper pulmonary nodule was found 05/2023; recommendation at the time was for monitoring with low dose CT in 6-12 months. Consider ordering this if aligns with GOC.   Discharge Diagnoses/Problem List:  Principal Problem:   Sepsis (HCC) Active Problems:   Controlled type 2 diabetes mellitus with diabetic neuropathy, with long-term current use of insulin (HCC)   Confusion   Weakness   Thrombocytosis   Leg pain   Hypomagnesemia   Fatigue   Dementia (HCC)   Chronic health problem   Constipation   Disposition: SNF  Discharge Condition: Stable  Discharge Exam:  General: curled up in bed, NAD CV: RRR Resp: CTA bilaterally, no increased WOB on RA Abdomen: soft, nontender Extremities: no edema  Significant Procedures: none  Significant Labs and Imaging:  No results for input(s): "WBC", "HGB", "HCT", "PLT" in the last 48 hours. No results for input(s): "NA", "K", "CL", "CO2", "GLUCOSE", "BUN", "CREATININE", "CALCIUM", "MG", "PHOS", "ALKPHOS", "AST", "ALT", "ALBUMIN", "PROTEIN" in the last 48 hours.  Results/Tests Pending at Time of Discharge: none  Discharge Medications:  Allergies as of 03/03/2024       Reactions   Asa [aspirin] Other (See Comments)   Causes ulcers   Motrin [ibuprofen] Other (See Comments)   GI intolerance   Cipro [ciprofloxacin Hcl] Rash   Ok to use eye drop formulation   Sulfa Antibiotics Other (See Comments)   GI issues        Medication List     STOP taking these medications    sertraline 25 MG tablet Commonly known as: ZOLOFT       TAKE these medications    acetaminophen 325 MG tablet Commonly known as: TYLENOL Take 2 tablets (650 mg total)  by mouth every 6 (six) hours.   Baclofen 5 MG Tabs Take 1 tablet by mouth 3 (three) times daily.   carvedilol 6.25 MG tablet Commonly known as: COREG Take 1 tablet (6.25 mg total) by mouth 2 (two) times daily with a meal.   diclofenac Sodium 1 % Gel Commonly known as: Voltaren Apply 2  g topically 4 (four) times daily as needed (pain).   DULoxetine 30 MG capsule Commonly known as: CYMBALTA Take 30 mg by mouth daily.   famotidine 20 MG tablet Commonly known as: PEPCID Take 1 tablet (20 mg total) by mouth at bedtime.   feeding supplement (PRO-STAT SUGAR FREE 64) Liqd Take 30 mLs by mouth daily.   gabapentin 400 MG capsule Commonly known as: NEURONTIN Take 400 mg by mouth at bedtime.   insulin glargine 100 UNIT/ML injection Commonly known as: LANTUS Inject 0.05 mLs (5 Units total) into the skin daily. What changed: how much to take   insulin lispro 100 UNIT/ML injection Commonly known as: HUMALOG Inject 0-12 Units into the skin 4 (four) times daily -  with meals and at bedtime.   methenamine 1 g tablet Commonly known as: HIPREX Take 1 g by mouth 2 (two) times daily.   metoCLOPramide 10 MG tablet Commonly known as: Reglan Take 1 tablet (10 mg total) by mouth 4 (four) times daily -  before meals and at bedtime. Take 1 tablet 10 mg by mouth 3 times/day before meals and at bedtime prn   mirtazapine 7.5 MG tablet Commonly known as: REMERON Take 1 tablet (7.5 mg total) by mouth at bedtime.   oxyCODONE 5 MG immediate release tablet Commonly known as: Roxicodone Take 0.5 tablets (2.5 mg total) by mouth 2 (two) times daily as needed for severe pain (pain score 7-10). What changed: when to take this   polyethylene glycol powder 17 GM/SCOOP powder Commonly known as: GLYCOLAX/MIRALAX Dissolve 1 capful  (17 g) in water and take by mouth daily.   rosuvastatin 10 MG tablet Commonly known as: Crestor Take 1 tablet (10 mg total) by mouth daily.        Discharge Instructions: Please refer to Patient Instructions section of EMR for full details.  Patient was counseled important signs and symptoms that should prompt return to medical care, changes in medications, dietary instructions, activity restrictions, and follow up appointments.   Follow-Up Appointments: As  appropriate at facility  Cyndia Skeeters, DO 03/03/2024, 10:33 AM PGY-1, [facility]

## 2024-03-03 NOTE — Progress Notes (Signed)
 [name] to be D/C'd  per MD order.  Discussed with the patient and all questions fully answered.  VSS, Skin clean, dry and intact without evidence of skin break down, no evidence of skin tears noted.  IV catheter discontinued intact. Site without signs and symptoms of complications. Dressing and pressure applied.  An After Visit Summary was printed and given to PTAR.   Patient d/c'd and transported by [facility] to [facility].

## 2024-03-03 NOTE — Assessment & Plan Note (Deleted)
 No bowel movement in 4 days.  Is on very low-dose oxycodone.*** - Colace today - If no BM, consider enema

## 2024-03-03 NOTE — Plan of Care (Signed)
  Problem: Education: Goal: Knowledge of General Education information will improve Description: Including pain rating scale, medication(s)/side effects and non-pharmacologic comfort measures Outcome: Adequate for Discharge   Problem: Health Behavior/Discharge Planning: Goal: Ability to manage health-related needs will improve Outcome: Adequate for Discharge   Problem: Clinical Measurements: Goal: Ability to maintain clinical measurements within normal limits will improve Outcome: Adequate for Discharge Goal: Will remain free from infection Outcome: Adequate for Discharge Goal: Diagnostic test results will improve Outcome: Adequate for Discharge Goal: Respiratory complications will improve Outcome: Adequate for Discharge Goal: Cardiovascular complication will be avoided Outcome: Adequate for Discharge   Problem: Activity: Goal: Risk for activity intolerance will decrease Outcome: Adequate for Discharge   Problem: Nutrition: Goal: Adequate nutrition will be maintained Outcome: Adequate for Discharge   Problem: Coping: Goal: Level of anxiety will decrease Outcome: Adequate for Discharge   Problem: Elimination: Goal: Will not experience complications related to bowel motility Outcome: Adequate for Discharge Goal: Will not experience complications related to urinary retention Outcome: Adequate for Discharge   Problem: Pain Managment: Goal: General experience of comfort will improve and/or be controlled Outcome: Adequate for Discharge   Problem: Safety: Goal: Ability to remain free from injury will improve Outcome: Adequate for Discharge   Problem: Skin Integrity: Goal: Risk for impaired skin integrity will decrease Outcome: Adequate for Discharge   Problem: Fluid Volume: Goal: Hemodynamic stability will improve Outcome: Adequate for Discharge   Problem: Clinical Measurements: Goal: Diagnostic test results will improve Outcome: Adequate for Discharge Goal: Signs  and symptoms of infection will decrease Outcome: Adequate for Discharge   Problem: Respiratory: Goal: Ability to maintain adequate ventilation will improve Outcome: Adequate for Discharge   Problem: Education: Goal: Ability to describe self-care measures that may prevent or decrease complications (Diabetes Survival Skills Education) will improve Outcome: Adequate for Discharge Goal: Individualized Educational Video(s) Outcome: Adequate for Discharge   Problem: Coping: Goal: Ability to adjust to condition or change in health will improve Outcome: Adequate for Discharge   Problem: Fluid Volume: Goal: Ability to maintain a balanced intake and output will improve Outcome: Adequate for Discharge   Problem: Health Behavior/Discharge Planning: Goal: Ability to identify and utilize available resources and services will improve Outcome: Adequate for Discharge Goal: Ability to manage health-related needs will improve Outcome: Adequate for Discharge   Problem: Metabolic: Goal: Ability to maintain appropriate glucose levels will improve Outcome: Adequate for Discharge   Problem: Nutritional: Goal: Maintenance of adequate nutrition will improve Outcome: Adequate for Discharge Goal: Progress toward achieving an optimal weight will improve Outcome: Adequate for Discharge   Problem: Skin Integrity: Goal: Risk for impaired skin integrity will decrease Outcome: Adequate for Discharge   Problem: Tissue Perfusion: Goal: Adequacy of tissue perfusion will improve Outcome: Adequate for Discharge

## 2024-03-03 NOTE — Plan of Care (Signed)
  Problem: Nutrition: Goal: Adequate nutrition will be maintained Outcome: Progressing   Problem: Pain Managment: Goal: General experience of comfort will improve and/or be controlled Outcome: Progressing   Problem: Education: Goal: Knowledge of General Education information will improve Description: Including pain rating scale, medication(s)/side effects and non-pharmacologic comfort measures Outcome: Not Progressing   Problem: Health Behavior/Discharge Planning: Goal: Ability to manage health-related needs will improve Outcome: Not Progressing   Problem: Clinical Measurements: Goal: Ability to maintain clinical measurements within normal limits will improve Outcome: Not Progressing

## 2024-03-03 NOTE — Progress Notes (Signed)
 Report given to [place], LPN at [facility]. My direct number given for any further questions.

## 2024-03-03 NOTE — Assessment & Plan Note (Deleted)
 HTN: no medications currently HLD: home Crestor 10 mg daily DDD: Chronic issue. Has Tylenol, oxycodone ordered as above. May also use Voltaren gel. Peripheral neuropathy: Home Gabapentin 400 mg at bedtime. Gastroparesis: Hx of partial gastrectomy  Depression: Continue home mirtazapine 7.5mg  at bedtime; holding home sertraline as pt is on duloxetine T2DM: Continue 10 units Lantus, SSI, CBGs

## 2024-03-05 NOTE — Addendum Note (Signed)
 Addended by: Geralyn Flash D on: 03/05/2024 10:56 AM   Modules accepted: Orders

## 2024-03-05 NOTE — Progress Notes (Signed)
 Remote pacemaker transmission.

## 2024-03-09 NOTE — Progress Notes (Deleted)
 Cardiology Office Note:    Date:  03/09/2024   ID:  Judith [name], DOB 03-Nov-1941, MRN [id number]  PCP:  Darral Dash, DO   [facility] Providers Cardiologist:  Thurmon Fair, MD { Click to update primary MD,subspecialty MD or APP then REFRESH:1}    Referring MD: Darral Dash, DO   No chief complaint on file. ***  History of Present Illness:    Judith [name] is a 83 y.o. female with a hx of CHB with PPM in place, DM2 on insulin, HLD, peripheral neuropathy.  She has a Press photographer with leads that are not MRI compatible with a GEN change May 2024.  She is 100% V paced with 2% atrial pacing.  She has had several recent admissions for falls at [facility] starting in January 2025.  During her most recent hospitalization 02/21/2024 she was treated for sepsis and AMS after an unwitnessed fall.  She was initially treated with IV antibiotic and no source of infection was found.  Cardiology evaluated for mild and flat troponin elevation.  No further cardiology workup was planned.  She was placed on my schedule for hospitalization follow-up.       CHB s/p PPM - 100% V pacing   Hypertension - 12.5 mg Coreg twice daily, 50 mg losartan,   Hyperlipidemia - continue crestor 10 mg Do not feel strongly about repeating a lipid panel given age     Past Medical History:  Diagnosis Date   Abdominal pain 06/13/2023   Allergy 1990   Anemia    Arthritis    Biliary gastritis    Blood transfusion without reported diagnosis    2007   Cervical spine arthritis 04/05/2016   Choledocholithiasis    Controlled type 2 diabetes mellitus with diabetic neuropathy, with long-term current use of insulin (HCC)    Depression    Diabetes mellitus type 2 with complications (HCC)    Dilated bile duct    Duodenal ulcer disease    Elevated liver function tests    Encounter for long-term (current) use of insulin (HCC)    Encounter to establish care with new  doctor 08/07/2022   Fall 01/11/2023   Gallstones 01/17/2018   Gastric ulcer    Gastritis and gastroduodenitis    Gastroparesis    severe   GERD (gastroesophageal reflux disease)    Gout    tested for gout but was not   Heart murmur    Hiatal hernia    Hyperlipidemia    in past not now   Hypertension    Hypokalemia, gastrointestinal losses    secondary to severe gastroparesis   Internal hemorrhoids    Long-term insulin use in type 2 diabetes (HCC) 04/05/2016   Osteoporosis    just had bone density test possible    Peripheral neuropathy    Postnasal drip 11/01/2022   Presence of cardiac pacemaker    S/P partial gastrectomy    due to severe gastric and gudoenal ulcers   Seasonal allergies    Sleep apnea    could not use CPAP   UTI (urinary tract infection)     Past Surgical History:  Procedure Laterality Date   BILIARY DILATION  02/01/2022   Procedure: BILIARY DILATION;  Surgeon: Lemar Lofty., MD;  Location: [facility] ENDOSCOPY;  Service: Gastroenterology;;   BILIARY DILATION  04/09/2022   Procedure: BILIARY DILATION;  Surgeon: Lemar Lofty., MD;  Location: [facility] ENDOSCOPY;  Service: Gastroenterology;;   BILIARY DILATION  06/13/2023  Procedure: BILIARY DILATION;  Surgeon: Meridee Score Netty Starring., MD;  Location: [facility] ENDOSCOPY;  Service: Gastroenterology;;   BILIARY STENT PLACEMENT N/A 11/30/2021   Procedure: BILIARY STENT PLACEMENT;  Surgeon: Lemar Lofty., MD;  Location: [facility] ENDOSCOPY;  Service: Gastroenterology;  Laterality: N/A;   BILIARY STENT PLACEMENT  02/01/2022   Procedure: BILIARY STENT PLACEMENT;  Surgeon: Meridee Score Netty Starring., MD;  Location: [facility] ENDOSCOPY;  Service: Gastroenterology;;   BIOPSY  11/30/2021   Procedure: BIOPSY;  Surgeon: Lemar Lofty., MD;  Location: [facility] ENDOSCOPY;  Service: Gastroenterology;;   BIOPSY  04/09/2022   Procedure: BIOPSY;  Surgeon: Lemar Lofty., MD;  Location: [facility] ENDOSCOPY;  Service:  Gastroenterology;;   CHOLECYSTECTOMY N/A 12/06/2021   Procedure: LAPAROSCOPIC CHOLECYSTECTOMY, LYSIS OF ADHESIONS;  Surgeon: Harriette Bouillon, MD;  Location: [facility] ORS;  Service: General;  Laterality: N/A;   COLONOSCOPY  02/11/2018   no polyps, + small internal hemorrhoids   ENDOSCOPIC RETROGRADE CHOLANGIOPANCREATOGRAPHY (ERCP) WITH PROPOFOL N/A 11/30/2021   Procedure: ENDOSCOPIC RETROGRADE CHOLANGIOPANCREATOGRAPHY (ERCP) WITH PROPOFOL;  Surgeon: Lemar Lofty., MD;  Location: [facility] ENDOSCOPY;  Service: Gastroenterology;  Laterality: N/A;   ENDOSCOPIC RETROGRADE CHOLANGIOPANCREATOGRAPHY (ERCP) WITH PROPOFOL N/A 02/01/2022   Procedure: ENDOSCOPIC RETROGRADE CHOLANGIOPANCREATOGRAPHY (ERCP) WITH PROPOFOL;  Surgeon: Meridee Score Netty Starring., MD;  Location: [facility] ENDOSCOPY;  Service: Gastroenterology;  Laterality: N/A;   ENDOSCOPIC RETROGRADE CHOLANGIOPANCREATOGRAPHY (ERCP) WITH PROPOFOL N/A 04/09/2022   Procedure: ENDOSCOPIC RETROGRADE CHOLANGIOPANCREATOGRAPHY (ERCP) WITH PROPOFOL;  Surgeon: Meridee Score Netty Starring., MD;  Location: [facility] ENDOSCOPY;  Service: Gastroenterology;  Laterality: N/A;   ENDOSCOPIC RETROGRADE CHOLANGIOPANCREATOGRAPHY (ERCP) WITH PROPOFOL N/A 06/13/2023   Procedure: ENDOSCOPIC RETROGRADE CHOLANGIOPANCREATOGRAPHY (ERCP) WITH PROPOFOL;  Surgeon: Meridee Score Netty Starring., MD;  Location: [facility] ENDOSCOPY;  Service: Gastroenterology;  Laterality: N/A;   ESOPHAGOGASTRODUODENOSCOPY (EGD) WITH PROPOFOL N/A 11/30/2021   Procedure: ESOPHAGOGASTRODUODENOSCOPY (EGD) WITH PROPOFOL;  Surgeon: Meridee Score Netty Starring., MD;  Location: [facility] ENDOSCOPY;  Service: Gastroenterology;  Laterality: N/A;   EUS N/A 12/12/2017   Procedure: UPPER ENDOSCOPIC ULTRASOUND (EUS) RADIAL;  Surgeon: Rachael Fee, MD;  Location: [facility] ENDOSCOPY;  Service: Endoscopy;  Laterality: N/A;   EYE SURGERY  2024   GASTRECTOMY     JOINT REPLACEMENT     pace maker     PARTIAL GASTRECTOMY     PPM GENERATOR CHANGEOUT N/A 04/25/2023    Procedure: PPM GENERATOR CHANGEOUT;  Surgeon: Thurmon Fair, MD;  Location: [facility] INVASIVE CV LAB;  Service: Cardiovascular;  Laterality: N/A;   REMOVAL OF STONES  02/01/2022   Procedure: REMOVAL OF STONES;  Surgeon: Meridee Score Netty Starring., MD;  Location: [facility] ENDOSCOPY;  Service: Gastroenterology;;   REMOVAL OF STONES  04/09/2022   Procedure: REMOVAL OF STONES;  Surgeon: Lemar Lofty., MD;  Location: [facility] ENDOSCOPY;  Service: Gastroenterology;;   REMOVAL OF STONES  06/13/2023   Procedure: REMOVAL OF STONES;  Surgeon: Lemar Lofty., MD;  Location: [facility] ENDOSCOPY;  Service: Gastroenterology;;   Dennison Mascot  11/30/2021   Procedure: Dennison Mascot;  Surgeon: Lemar Lofty., MD;  Location: [facility] ENDOSCOPY;  Service: Gastroenterology;;   Francine Graven REMOVAL  02/01/2022   Procedure: STENT REMOVAL;  Surgeon: Lemar Lofty., MD;  Location: [facility] ENDOSCOPY;  Service: Gastroenterology;;   Francine Graven REMOVAL  04/09/2022   Procedure: STENT REMOVAL;  Surgeon: Lemar Lofty., MD;  Location: [facility] ENDOSCOPY;  Service: Gastroenterology;;   SUBMUCOSAL TATTOO INJECTION  11/30/2021   Procedure: SUBMUCOSAL TATTOO INJECTION;  Surgeon: Lemar Lofty., MD;  Location: [facility] ENDOSCOPY;  Service: Gastroenterology;;   TONSILLECTOMY     45 years  ago   UPPER GASTROINTESTINAL ENDOSCOPY      Current Medications: No outpatient medications have been marked as taking for the 03/11/24 encounter (Appointment) with Marcelino Duster, PA.     Allergies:   Asa [aspirin], Motrin [ibuprofen], Cipro [ciprofloxacin hcl], and Sulfa antibiotics   Social History   Socioeconomic History   Marital status: Widowed    Spouse name: Not on file   Number of children: 3   Years of education: Not on file   Highest education level: GED or equivalent  Occupational History   Not on file  Tobacco Use   Smoking status: Never   Smokeless tobacco: Never  Vaping Use   Vaping status: Never Used  Substance  and Sexual Activity   Alcohol use: No   Drug use: No   Sexual activity: Not Currently  Other Topics Concern   Not on file  Social History Narrative   Used to live independently in [place]   However had significant issues with severe gastroparesis resulting in severe hypokalemia and general weakness and fall resulting in hospitalization and SNF rehab   Now (10/2017) living with her daughter in [place] until able to stabilize and regain independence.      May 2019   Patient living in assisted living, [place]   Social Drivers of Health   Financial Resource Strain: Low Risk  (10/19/2023)   Overall Financial Resource Strain (CARDIA)    Difficulty of Paying Living Expenses: Not very hard  Food Insecurity: Patient Unable To Answer (02/23/2024)   Hunger Vital Sign    Worried About Running Out of Food in the Last Year: Patient unable to answer    Ran Out of Food in the Last Year: Patient unable to answer  Transportation Needs: Patient Unable To Answer (02/23/2024)   PRAPARE - Transportation    Lack of Transportation (Medical): Patient unable to answer    Lack of Transportation (Non-Medical): Patient unable to answer  Physical Activity: Unknown (10/19/2023)   Exercise Vital Sign    Days of Exercise per Week: Patient declined    Minutes of Exercise per Session: 0 min  Recent Concern: Physical Activity - Inactive (09/27/2023)   Exercise Vital Sign    Days of Exercise per Week: 0 days    Minutes of Exercise per Session: 0 min  Stress: Patient Declined (10/19/2023)   [employer] of Occupational Health - Occupational Stress Questionnaire    Feeling of Stress : Patient declined  Social Connections: Unknown (02/24/2024)   Social Connection and Isolation Panel [NHANES]    Frequency of Communication with Friends and Family: Patient unable to answer    Frequency of Social Gatherings with Friends and Family: Patient unable to answer    Attends Religious Services: Patient unable to answer    Active  Member of Clubs or Organizations: Patient unable to answer    Attends Banker Meetings: Patient unable to answer    Marital Status: Widowed  Recent Concern: Social Connections - Socially Isolated (02/07/2024)   Social Connection and Isolation Panel [NHANES]    Frequency of Communication with Friends and Family: Twice a week    Frequency of Social Gatherings with Friends and Family: Once a week    Attends Religious Services: Never    Database administrator or Organizations: No    Attends Banker Meetings: Never    Marital Status: Widowed     Family History: The patient's ***family history includes Diabetes in her father, mother, and sister; Heart  disease in her father and son. There is no history of Colon cancer, Colon polyps, Rectal cancer, Stomach cancer, or Esophageal cancer.  ROS:   Please see the history of present illness.    *** All other systems reviewed and are negative.  EKGs/Labs/Other Studies Reviewed:    The following studies were reviewed today: ***      Recent Labs: 02/21/2024: ALT 12 02/22/2024: TSH 1.731 02/27/2024: BUN 24; Magnesium 1.9; Potassium 4.8; Sodium 132 02/29/2024: Creatinine, Ser 0.60 03/01/2024: Hemoglobin 11.1; Platelets 600  Recent Lipid Panel    Component Value Date/Time   CHOL 140 02/14/2021 1017   TRIG 95 02/14/2021 1017   HDL 56 02/14/2021 1017   CHOLHDL 2.5 02/14/2021 1017   LDLCALC 66 02/14/2021 1017     Risk Assessment/Calculations:   {Does this patient have ATRIAL FIBRILLATION?:[phone]}  No BP recorded.  {Refresh Note OR Click here to enter BP  :1}***         Physical Exam:    VS:  There were no vitals taken for this visit.    Wt Readings from Last 3 Encounters:  02/21/24 105 lb (47.6 kg)  02/07/24 105 lb 2.6 oz (47.7 kg)  01/25/24 102 lb 8.2 oz (46.5 kg)     GEN: *** Well nourished, well developed in no acute distress HEENT: Normal NECK: No JVD; No carotid bruits LYMPHATICS: No  lymphadenopathy CARDIAC: ***RRR, no murmurs, rubs, gallops RESPIRATORY:  Clear to auscultation without rales, wheezing or rhonchi  ABDOMEN: Soft, non-tender, non-distended MUSCULOSKELETAL:  No edema; No deformity  SKIN: Warm and dry NEUROLOGIC:  Alert and oriented x 3 PSYCHIATRIC:  Normal affect   ASSESSMENT:    No diagnosis found. PLAN:    In order of problems listed above:  ***      {Are you ordering a CV Procedure (e.g. stress test, cath, DCCV, TEE, etc)?   Press F2        :[id number]}    Medication Adjustments/Labs and Tests Ordered: Current medicines are reviewed at length with the patient today.  Concerns regarding medicines are outlined above.  No orders of the defined types were placed in this encounter.  No orders of the defined types were placed in this encounter.   There are no Patient Instructions on file for this visit.   Signed, Roe Rutherford Cristiano Capri, PA  03/09/2024 2:02 PM    [facility]

## 2024-03-11 ENCOUNTER — Ambulatory Visit: Payer: 59 | Admitting: Physician Assistant

## 2024-04-14 ENCOUNTER — Encounter (INDEPENDENT_AMBULATORY_CARE_PROVIDER_SITE_OTHER): Payer: Medicare Other | Admitting: Ophthalmology

## 2024-04-27 ENCOUNTER — Ambulatory Visit (INDEPENDENT_AMBULATORY_CARE_PROVIDER_SITE_OTHER): Payer: Medicare Other

## 2024-04-27 DIAGNOSIS — I442 Atrioventricular block, complete: Secondary | ICD-10-CM

## 2024-04-27 LAB — CUP PACEART REMOTE DEVICE CHECK
Battery Remaining Longevity: 84 mo
Battery Remaining Percentage: 100 %
Brady Statistic RA Percent Paced: 2 %
Brady Statistic RV Percent Paced: 100 %
Date Time Interrogation Session: 20250505003000
Lead Channel Impedance Value: 571 Ohm
Lead Channel Impedance Value: 631 Ohm
Lead Channel Pacing Threshold Amplitude: 0.4 V
Lead Channel Pacing Threshold Amplitude: 0.6 V
Lead Channel Pacing Threshold Pulse Width: 0.4 ms
Lead Channel Pacing Threshold Pulse Width: 0.4 ms
Lead Channel Setting Pacing Amplitude: 2 V
Lead Channel Setting Pacing Amplitude: 2 V
Lead Channel Setting Pacing Pulse Width: 0.4 ms
Lead Channel Setting Sensing Sensitivity: 3.5 mV
Pulse Gen Serial Number: 763022
Zone Setting Status: 755011

## 2024-04-28 ENCOUNTER — Emergency Department (HOSPITAL_COMMUNITY)
Admission: EM | Admit: 2024-04-28 | Discharge: 2024-04-29 | Disposition: A | Discharge status: Skilled Nursing Facility | Attending: Emergency Medicine | Admitting: Emergency Medicine

## 2024-04-28 ENCOUNTER — Emergency Department (HOSPITAL_COMMUNITY)

## 2024-04-28 ENCOUNTER — Encounter (HOSPITAL_COMMUNITY): Payer: Self-pay

## 2024-04-28 ENCOUNTER — Other Ambulatory Visit: Payer: Self-pay

## 2024-04-28 ENCOUNTER — Encounter: Payer: Self-pay | Admitting: Cardiovascular Disease

## 2024-04-28 DIAGNOSIS — R0902 Hypoxemia: Secondary | ICD-10-CM

## 2024-04-28 DIAGNOSIS — Z794 Long term (current) use of insulin: Secondary | ICD-10-CM | POA: Insufficient documentation

## 2024-04-28 DIAGNOSIS — R0602 Shortness of breath: Secondary | ICD-10-CM | POA: Insufficient documentation

## 2024-04-28 DIAGNOSIS — I10 Essential (primary) hypertension: Secondary | ICD-10-CM | POA: Diagnosis not present

## 2024-04-28 DIAGNOSIS — E1143 Type 2 diabetes mellitus with diabetic autonomic (poly)neuropathy: Secondary | ICD-10-CM | POA: Diagnosis not present

## 2024-04-28 DIAGNOSIS — Z79899 Other long term (current) drug therapy: Secondary | ICD-10-CM | POA: Diagnosis not present

## 2024-04-28 LAB — COMPREHENSIVE METABOLIC PANEL WITH GFR
ALT: 10 U/L (ref 0–44)
AST: 28 U/L (ref 15–41)
Albumin: 2.9 g/dL — ABNORMAL LOW (ref 3.5–5.0)
Alkaline Phosphatase: 40 U/L (ref 38–126)
Anion gap: 10 (ref 5–15)
BUN: 15 mg/dL (ref 8–23)
CO2: 25 mmol/L (ref 22–32)
Calcium: 8.9 mg/dL (ref 8.9–10.3)
Chloride: 103 mmol/L (ref 98–111)
Creatinine, Ser: 0.56 mg/dL (ref 0.44–1.00)
GFR, Estimated: >60 mL/min (ref >60)
Glucose, Bld: 90 mg/dL (ref 70–99)
Potassium: 3.8 mmol/L (ref 3.5–5.1)
Sodium: 138 mmol/L (ref 135–145)
Total Bilirubin: 0.4 mg/dL (ref 0.0–1.2)
Total Protein: 5.8 g/dL — ABNORMAL LOW (ref 6.5–8.1)

## 2024-04-28 LAB — CBC
HCT: 34.8 % — ABNORMAL LOW (ref 36.0–46.0)
Hemoglobin: 11.1 g/dL — ABNORMAL LOW (ref 12.0–15.0)
MCH: 29.9 pg (ref 26.0–34.0)
MCHC: 31.9 g/dL (ref 30.0–36.0)
MCV: 93.8 fL (ref 80.0–100.0)
Platelets: 323 10*3/uL (ref 150–400)
RBC: 3.71 MIL/uL — ABNORMAL LOW (ref 3.87–5.11)
RDW: 16.8 % — ABNORMAL HIGH (ref 11.5–15.5)
WBC: 9.9 10*3/uL (ref 4.0–10.5)
nRBC: 0 % (ref 0.0–0.2)

## 2024-04-28 LAB — RESP PANEL BY RT-PCR (RSV, FLU A&B, COVID)  RVPGX2
Influenza A by PCR: NEGATIVE
Influenza B by PCR: NEGATIVE
Resp Syncytial Virus by PCR: NEGATIVE
SARS Coronavirus 2 by RT PCR: NEGATIVE

## 2024-04-28 LAB — BRAIN NATRIURETIC PEPTIDE: B Natriuretic Peptide: 189.5 pg/mL — ABNORMAL HIGH (ref 0.0–100.0)

## 2024-04-28 LAB — TROPONIN I (HIGH SENSITIVITY)
Troponin I (High Sensitivity): 13 ng/L (ref <18)
Troponin I (High Sensitivity): 14 ng/L (ref <18)

## 2024-04-28 NOTE — ED Provider Notes (Signed)
 [place] EMERGENCY DEPARTMENT AT [facility] Provider Note   CSN: [id number] Arrival date & time: 04/28/24  1359     History  Chief Complaint  Patient presents with  . Shortness of Breath    Judith [name] is a 83 y.o. female.   Shortness of Breath    Patient has a history of hypertension diabetes, gastroparesis, ulcers, hyperlipidemia who presents ED for evaluation of possible breathing difficulties.  Patient is at [facility].  Her staff at the facility she started having trouble with shortness of breath yesterday.  They measured low oxygen levels and the patient was placed on 12 L oxygen with reported sats of 70%.  When EMS arrived she was 100% on 6 L of nasal cannula.  Patient is currently not requiring any oxygen.  She is not having any trouble with any chest pain or shortness of breath.  She denies any complaints right now states she feels fine  Home Medications Prior to Admission medications   Medication Sig Start Date End Date Taking? Authorizing Provider  acetaminophen  (TYLENOL ) 325 MG tablet Take 2 tablets (650 mg total) by mouth every 6 (six) hours. 03/03/24  Yes Omar Bibber, DO  Amino Acids-Protein Hydrolys (FEEDING SUPPLEMENT, PRO-STAT SUGAR FREE 64,) LIQD Take 30 mLs by mouth daily.   Yes [provider]  baclofen (LIORESAL) 10 MG tablet Take 10 mg by mouth 3 (three) times daily.   Yes [provider]  busPIRone (BUSPAR) 5 MG tablet Take 5 mg by mouth 2 (two) times daily.   Yes [provider]  carvedilol  (COREG ) 6.25 MG tablet Take 1 tablet (6.25 mg total) by mouth 2 (two) times daily with a meal. 02/03/24  Yes Jonne Netters, MD  Cholecalciferol (VITAMIN D) 50 MCG (2000 UT) CAPS Take 2,000 Units by mouth daily.   Yes [provider]  diclofenac  Sodium (VOLTAREN ) 1 % GEL Apply 2 g topically 4 (four) times daily as needed (pain). Patient taking differently: Apply 2 g topically 4 (four) times daily as needed (pain). Neck,  shoulder, and knee pain 11/18/23  Yes Maxwell, Allee, MD  DULoxetine  (CYMBALTA ) 20 MG capsule Take 40 mg by mouth daily.   Yes [provider]  famotidine  (PEPCID ) 20 MG tablet Take 1 tablet (20 mg total) by mouth at bedtime. 06/11/19  Yes Elyce Hams, Marguerita Shih, MD  gabapentin  (NEURONTIN ) 400 MG capsule Take 400 mg by mouth at bedtime.   Yes [provider]  Glucerna Ivette Marks) LIQD Take 1 Can by mouth in the morning and at bedtime.   Yes [provider]  insulin  glargine (LANTUS ) 100 UNIT/ML injection Inject 0.05 mLs (5 Units total) into the skin daily. Patient taking differently: Inject 13 Units into the skin daily. 03/03/24  Yes Omar Bibber, DO  insulin  lispro (HUMALOG ) 100 UNIT/ML injection Inject 0-12 Units into the skin 4 (four) times daily -  with meals and at bedtime. BS 70-200 0 units  BS 201-250 2 units  BS 251-300 4 units  BS 301-350 6 units  BS 351-400 8 units  BS 401-450 10 units  BS 451-600 12 units   Yes [provider]  Magnesium  250 MG CAPS Take 250 mg by mouth daily.   Yes [provider]  methenamine  (HIPREX) 1 g tablet Take 1 g by mouth 2 (two) times daily. 01/21/24 01/20/25 Yes [provider]  metoCLOPramide  (REGLAN ) 10 MG tablet Take 1 tablet (10 mg total) by mouth 4 (four) times daily -  before meals and at bedtime. Take 1 tablet 10 mg by mouth 3 times/day before meals and at bedtime prn Patient taking differently: Take 10 mg by mouth 3 (three) times daily as needed (gastroparesis). 09/09/23 07/21/24 Yes Pyrtle, Amber Bail, MD  mirtazapine  (REMERON ) 7.5 MG tablet Take 1 tablet (7.5 mg total) by mouth at bedtime. 11/12/23  Yes Dameron, Marisa, DO  oxyCODONE  (ROXICODONE ) 5 MG immediate release tablet Take 0.5 tablets (2.5 mg total) by mouth 2 (two) times daily as needed for severe pain (pain score 7-10). Patient taking differently: Take 2.5 mg by mouth 2 (two) times daily as needed for moderate pain (pain score 4-6) or severe pain  (pain score 7-10). 01/28/24  Yes Edison Gore, MD  polyethylene glycol powder (GLYCOLAX /MIRALAX ) 17 GM/SCOOP powder Dissolve 1 capful  (17 g) in water and take by mouth daily. 01/27/24  Yes Edison Gore, MD  rosuvastatin  (CRESTOR ) 10 MG tablet Take 1 tablet (10 mg total) by mouth daily. Patient taking differently: Take 10 mg by mouth every evening. 01/27/24  Yes Edison Gore, MD      Allergies    Asa [aspirin], Motrin [ibuprofen], Tylenol  [acetaminophen ], Cipro  [ciprofloxacin  hcl], and Sulfa antibiotics    Review of Systems   Review of Systems  Respiratory:  Positive for shortness of breath.     Physical Exam Updated Vital Signs BP 129/62 (BP Location: Left Arm)   Pulse 84   Temp 98.4 F (36.9 C) (Oral)   Resp 19   Ht 1.6 m (5\' 3" )   Wt 52.2 kg   SpO2 100%   BMI 20.37 kg/m  Physical Exam Vitals and nursing note reviewed.  Constitutional:      Appearance: She is well-developed. She is not diaphoretic.     Comments: Elderly frail  HENT:     Head: Normocephalic and atraumatic.     Right Ear: External ear normal.     Left Ear: External ear normal.  Eyes:     General: No scleral icterus.       Right eye: No discharge.        Left eye: No discharge.     Conjunctiva/sclera: Conjunctivae normal.  Neck:     Trachea: No tracheal deviation.  Cardiovascular:     Rate and Rhythm: Normal rate and regular rhythm.  Pulmonary:     Effort: Pulmonary effort is normal. No respiratory distress.     Breath sounds: Normal breath sounds. No stridor. No wheezing or rales.  Abdominal:     General: Bowel sounds are normal. There is no distension.     Palpations: Abdomen is soft.     Tenderness: There is no abdominal tenderness. There is no guarding or rebound.  Musculoskeletal:        General: No tenderness or deformity.     Cervical back: Neck supple.     Right lower leg: No edema.     Left lower leg: No edema.  Skin:    General: Skin is warm and dry.     Findings: No rash.   Neurological:     General: No focal deficit present.     Mental Status: She is alert.     Cranial Nerves: No cranial nerve deficit, dysarthria or facial asymmetry.     Sensory: No sensory deficit.     Motor: No abnormal muscle tone or seizure activity.     Coordination: Coordination normal.  Psychiatric:        Mood and Affect: Mood normal.  ED Results / Procedures / Treatments   Labs (all labs ordered are listed, but only abnormal results are displayed) Labs Reviewed  RESP PANEL BY RT-PCR (RSV, FLU A&B, COVID)  [id number]  COMPREHENSIVE METABOLIC PANEL WITH GFR  CBC  BRAIN NATRIURETIC PEPTIDE  TROPONIN I (HIGH SENSITIVITY)    EKG EKG Interpretation Date/Time:  Tuesday Apr 28 2024 14:45:38 EDT Ventricular Rate:  82 PR Interval:  190 QRS Duration:  162 QT Interval:  432 QTC Calculation: 505 R Axis:   -85  Text Interpretation: Electronic ventricular pacemaker LVH with IVCD, LAD and secondary repol abnrm No significant change since last tracing Confirmed by Trish Furl [phone]) on 04/28/2024 3:04:21 PM  Radiology CUP PACEART REMOTE DEVICE CHECK Result Date: 04/27/2024 PPM scheduled remote reviewed. Normal device function.  Presenting rhythm: AS-VP 1 ATR event x 2 sec on 02/10/24. Next remote 91 days. AB, CVRS   Procedures Procedures    Medications Ordered in ED Medications - No data to display  ED Course/ Medical Decision Making/ A&P Clinical Course as of 04/28/24 1555  Tue Apr 28, 2024  1554 DG [facility] [place] 1 View NAD.  No pna [JK]    Clinical Course User Index [JK] Trish Furl, MD                                 Medical Decision Making Amount and/or Complexity of Data Reviewed Labs: ordered. Radiology: ordered.   Patient presented to the ED for evaluation of possible shortness of breath.  History somewhat difficult as patient has some dementia.  Reportedly she was having breathing difficulty and low oxygen at the nursing facility.  On arrival to the ED patient  is not complaining of any respiratory difficulty.  She is breathing easily and has a normal oxygen saturation without any supplemental oxygen.  Will proceed with laboratory testing and x-rays.  Currently pending at shift change.  Care turned over to Dr Nora Beal        Final Clinical Impression(s) / ED Diagnoses Final diagnoses:  None    Rx / DC Orders ED Discharge Orders     None         Trish Furl, MD 04/28/24 1521

## 2024-04-28 NOTE — ED Notes (Signed)
 Ptar called

## 2024-04-28 NOTE — ED Triage Notes (Signed)
 PT BIB [facility] from [facility] with new onset SOB starting yesterday. Facility told EMS that PT was put on 12L oxygen and satting at 70%. PT was 100% on 6L of oxygen during transport. PT is Aox3 currently and at baseline, not oriented to time.  [facility] vitals: BP 110/60, HR 84, O2 100% on 6L, CBG 172.

## 2024-04-28 NOTE — Discharge Instructions (Signed)
 You were seen in the emergency department after your episode of low oxygen.  Your oxygen levels remain normal while you are in the emergency department here and you had no signs of abnormality with your heart or your lungs and your labs or x-ray.  It is possible that this could be related to sleep apnea and you can follow-up with pulmonology as needed to see if you may need a repeat sleep study and can otherwise follow-up with your primary doctor.  You should return to the emergency department if you are having severe shortness of breath, chest pain, you are drowsy and hard to wake up or if you have any other new or concerning symptoms.

## 2024-04-28 NOTE — ED Provider Notes (Signed)
 Patient signed out to me at 1530 by Dr. Monnie Anthony pending labs. In short this is a 83 year old female with PMH DM, gastroparesis, HLD that presented to the ED with SOB. Per EMS, patient's NH called for SOB and was reportedly hypoxic and placed on 12 L [place] prior to EMS arrival. She is now on room air with no complaints. EKG on arrival was a ventricularly paced rhythm without acute ischemic changes. CXR without acute disease. Labs are pending at this time.  5:27 PM Minimally elevated BNP, otherwise labs within normal range. Rpt troponin is in process. Patient's daughter is at bedside. States she has some dementia and won't remember what happened. States she has been diagnosed with sleep apnea but doesn't wear a CPAP or anything at night so unsure if that is contributing to the transient hypoxia. Patient is asymptomatic at this time, lungs clear to auscultation bilaterally, no LE edema or signs of volume overload.  6:12 PM Repeat troponin is negative. She is stable for discharge home and can follow up with pulm as needed for possible repeat sleep study.   Kingsley, Ophia Shamoon K, DO 04/28/24 [phone]

## 2024-04-29 ENCOUNTER — Telehealth: Payer: Self-pay

## 2024-04-29 NOTE — Telephone Encounter (Signed)
 This patient is appearing on a report for being at risk of failing the adherence measure for hypertension (ACEi/ARB) medications this calendar year.   Medication: losartan  100 mg daily Last fill date: 01/21/24 for 28 day supply  Patient is at [facility]. Medication no longer active on medication list. Patient admitted early March with sepsis and losartan  was not on facility [facility] at that time. No action needed at this time.     Abelina Abide, PharmD PGY1 Pharmacy Resident 04/29/2024 9:54 AM

## 2024-06-02 ENCOUNTER — Encounter: Payer: Self-pay | Admitting: *Deleted

## 2024-06-04 ENCOUNTER — Encounter (INDEPENDENT_AMBULATORY_CARE_PROVIDER_SITE_OTHER): Admitting: Ophthalmology

## 2024-06-04 DIAGNOSIS — I10 Essential (primary) hypertension: Secondary | ICD-10-CM

## 2024-06-04 DIAGNOSIS — E113393 Type 2 diabetes mellitus with moderate nonproliferative diabetic retinopathy without macular edema, bilateral: Secondary | ICD-10-CM

## 2024-06-04 DIAGNOSIS — Z794 Long term (current) use of insulin: Secondary | ICD-10-CM | POA: Diagnosis not present

## 2024-06-04 DIAGNOSIS — H43813 Vitreous degeneration, bilateral: Secondary | ICD-10-CM

## 2024-06-04 DIAGNOSIS — H35033 Hypertensive retinopathy, bilateral: Secondary | ICD-10-CM | POA: Diagnosis not present

## 2024-06-16 NOTE — Progress Notes (Signed)
 Remote pacemaker transmission.

## 2024-07-27 ENCOUNTER — Ambulatory Visit: Payer: Medicare Other

## 2024-07-27 DIAGNOSIS — I442 Atrioventricular block, complete: Secondary | ICD-10-CM | POA: Diagnosis not present

## 2024-07-27 LAB — CUP PACEART REMOTE DEVICE CHECK
Battery Remaining Longevity: 84 mo
Battery Remaining Percentage: 100 %
Brady Statistic RA Percent Paced: 2 %
Brady Statistic RV Percent Paced: 100 %
Date Time Interrogation Session: 20250804003100
Lead Channel Impedance Value: 595 Ohm
Lead Channel Impedance Value: 626 Ohm
Lead Channel Pacing Threshold Amplitude: 0.4 V
Lead Channel Pacing Threshold Amplitude: 0.7 V
Lead Channel Pacing Threshold Pulse Width: 0.4 ms
Lead Channel Pacing Threshold Pulse Width: 0.4 ms
Lead Channel Setting Pacing Amplitude: 2 V
Lead Channel Setting Pacing Amplitude: 2 V
Lead Channel Setting Pacing Pulse Width: 0.4 ms
Lead Channel Setting Sensing Sensitivity: 3.5 mV
Pulse Gen Serial Number: 763022
Zone Setting Status: 755011

## 2024-07-28 ENCOUNTER — Ambulatory Visit: Payer: Self-pay | Admitting: Cardiovascular Disease

## 2024-09-21 NOTE — Progress Notes (Signed)
 Remote PPM Transmission

## 2024-09-23 DEATH — deceased

## 2025-03-04 ENCOUNTER — Encounter (INDEPENDENT_AMBULATORY_CARE_PROVIDER_SITE_OTHER): Admitting: Ophthalmology
